# Patient Record
Sex: Male | Born: 1940
Health system: Southern US, Community
[De-identification: ages and names within clinical notes are randomized; demographics above are authoritative.]

## PROBLEM LIST (undated history)

## (undated) DIAGNOSIS — E785 Hyperlipidemia, unspecified: Secondary | ICD-10-CM

## (undated) DIAGNOSIS — I639 Cerebral infarction, unspecified: Secondary | ICD-10-CM

## (undated) DIAGNOSIS — M1990 Unspecified osteoarthritis, unspecified site: Secondary | ICD-10-CM

## (undated) DIAGNOSIS — H269 Unspecified cataract: Secondary | ICD-10-CM

## (undated) DIAGNOSIS — I10 Essential (primary) hypertension: Secondary | ICD-10-CM

## (undated) DIAGNOSIS — C801 Malignant (primary) neoplasm, unspecified: Secondary | ICD-10-CM

## (undated) HISTORY — DX: Malignant (primary) neoplasm, unspecified: C80.1

## (undated) HISTORY — DX: Essential (primary) hypertension: I10

## (undated) HISTORY — DX: Hyperlipidemia, unspecified: E78.5

## (undated) HISTORY — DX: Unspecified cataract: H26.9

## (undated) HISTORY — DX: Unspecified osteoarthritis, unspecified site: M19.90

## (undated) HISTORY — PX: COLONOSCOPY: SHX174

---

## 1966-11-13 HISTORY — PX: APPENDECTOMY: SHX54

## 2000-07-14 HISTORY — PX: CATARACT EXTRACTION: SUR2

## 2003-10-14 ENCOUNTER — Encounter: Payer: Self-pay | Admitting: Gastroenterology

## 2004-06-14 ENCOUNTER — Encounter: Admission: RE | Admit: 2004-06-14 | Discharge: 2004-06-14 | Payer: Self-pay | Admitting: Internal Medicine

## 2004-06-14 IMAGING — CR DG HIP COMPLETE 2+V*R*
3 series · 3 of 3 positions shown · non-contrast
Comparison: none

CLINICAL DATA: Right back and hip pain ? no known injury. 
 LUMBAR SPINE FOUR VIEWS: 
 There is no evidence of fracture.  Alignment is normal.  The intervertebral disc spaces are within normal limits and no other significant bone abnormalities are identified. 
 IMPRESSION
 Normal study. 
 RIGHT HIP COMPLETE: 
 AP view of the pelvis with cone down AP and frog leg views of the right hip were obtained.  No acute abnormality.  Joint space height of both hips is slightly diminished suggesting early degenerative change.  There are also some very minor degenerative changes at the SI joints.

[view not recorded (1 of 3)]
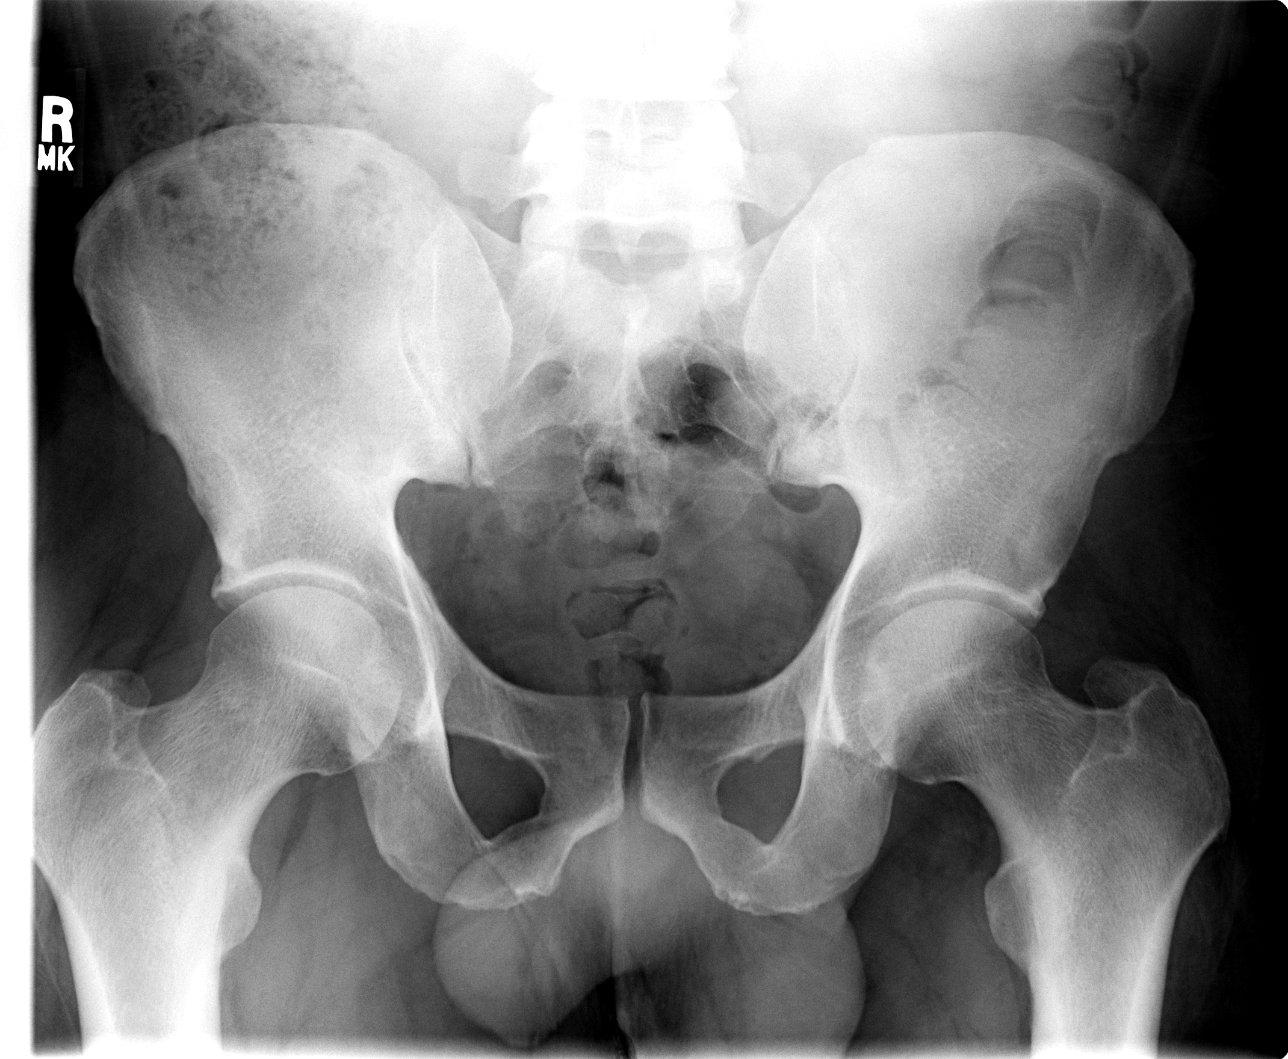

[view not recorded (2 of 3)]
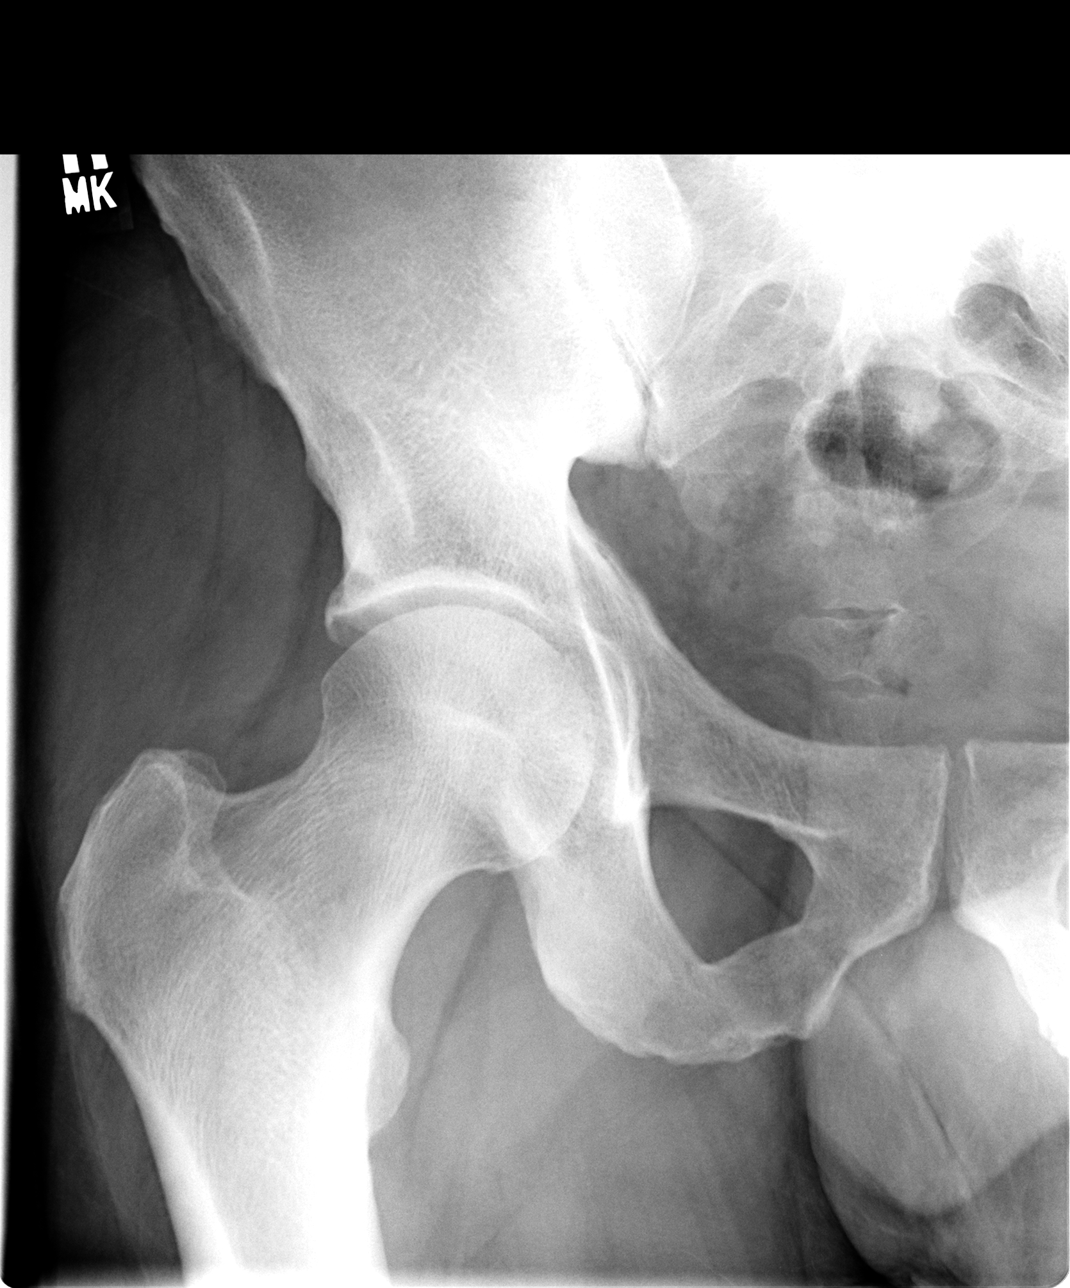

[view not recorded (3 of 3)]
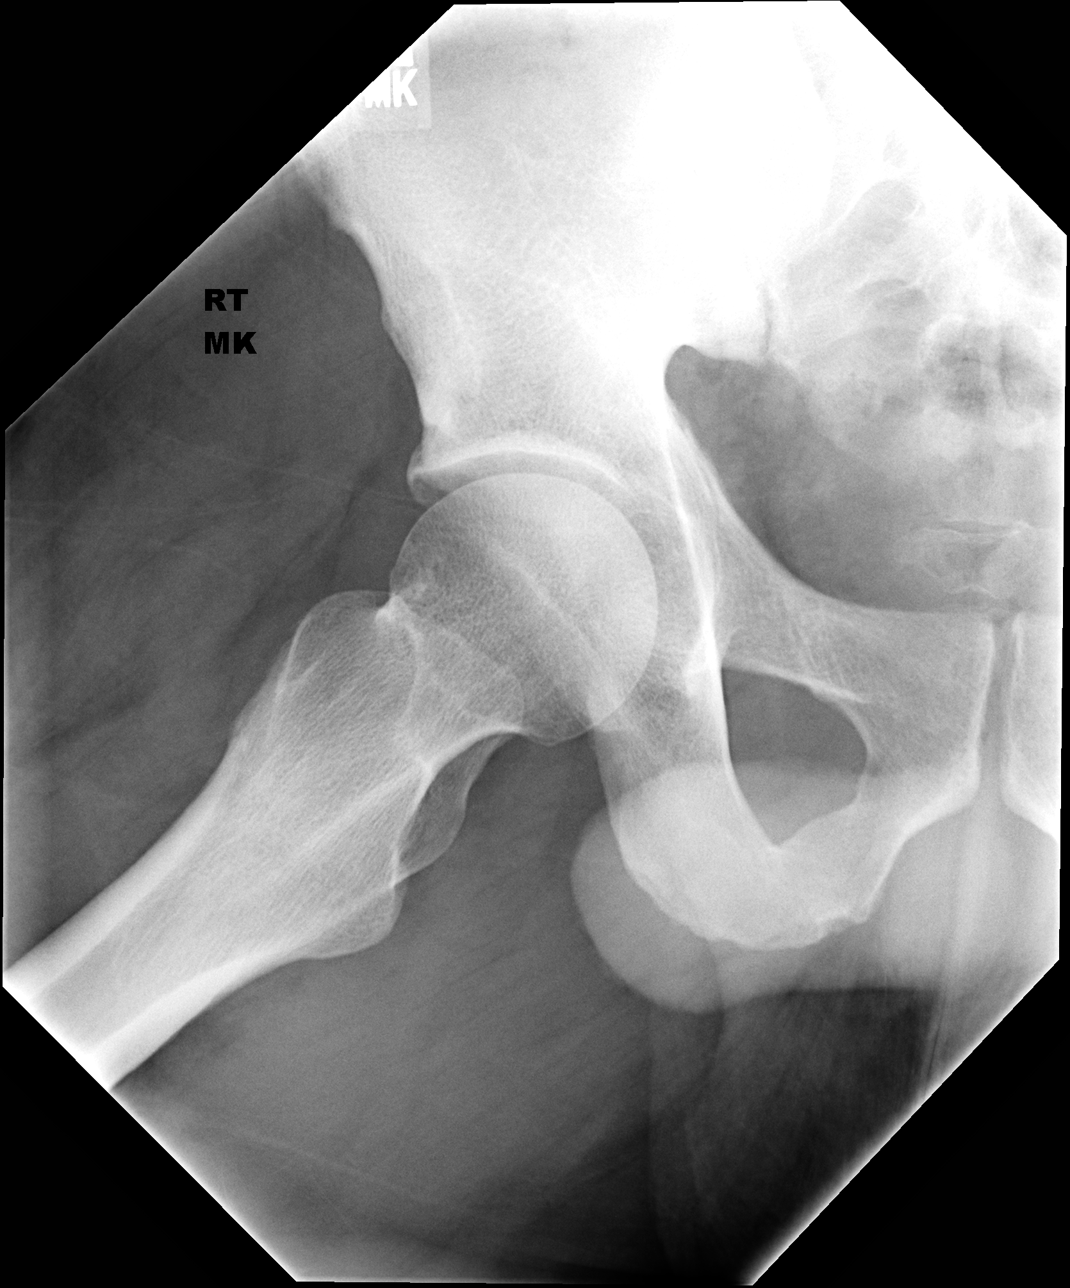

[3 of 3 positions shown; findings below may reference images not displayed]

IMPRESSION: Probable early degenerative changes of the hips.  No acute abnormality.

## 2004-06-14 IMAGING — CR DG LUMBAR SPINE COMPLETE 4+V
5 series · 5 of 5 positions shown · non-contrast
Comparison: none

CLINICAL DATA: Right back and hip pain ? no known injury. 
 LUMBAR SPINE FOUR VIEWS: 
 There is no evidence of fracture.  Alignment is normal.  The intervertebral disc spaces are within normal limits and no other significant bone abnormalities are identified. 
 IMPRESSION
 Normal study. 
 RIGHT HIP COMPLETE: 
 AP view of the pelvis with cone down AP and frog leg views of the right hip were obtained.  No acute abnormality.  Joint space height of both hips is slightly diminished suggesting early degenerative change.  There are also some very minor degenerative changes at the SI joints.

[view not recorded (1 of 5)]
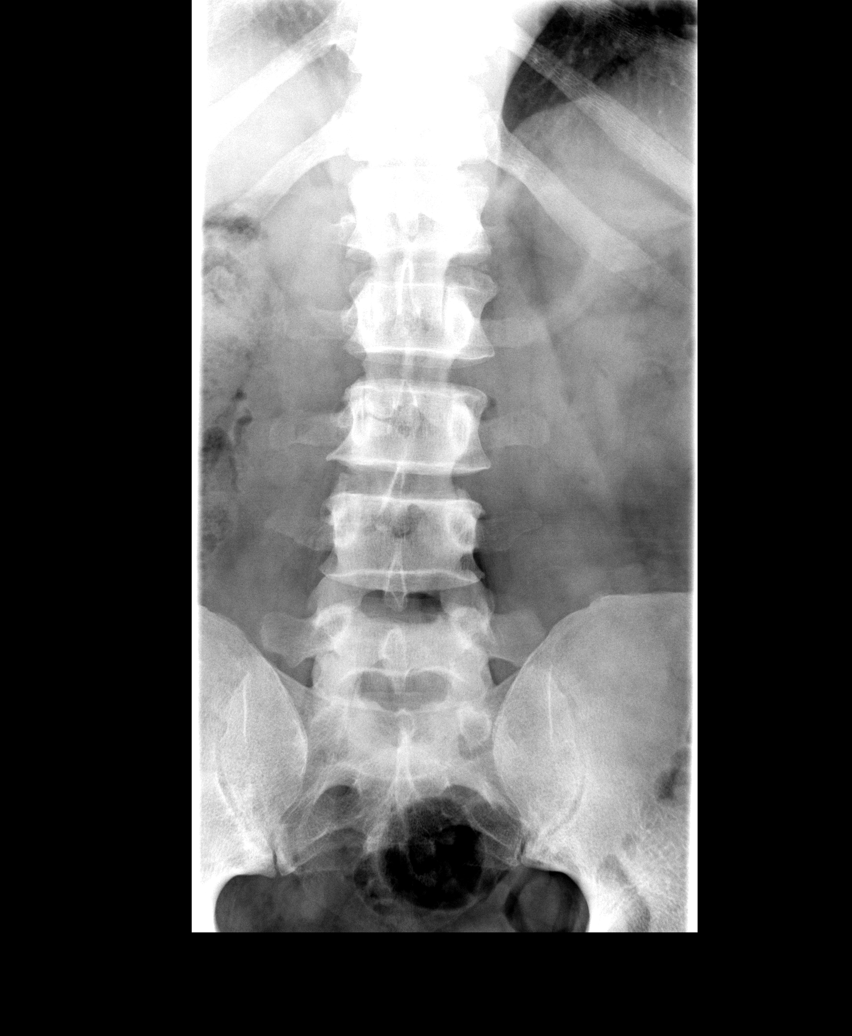

[view not recorded (2 of 5)]
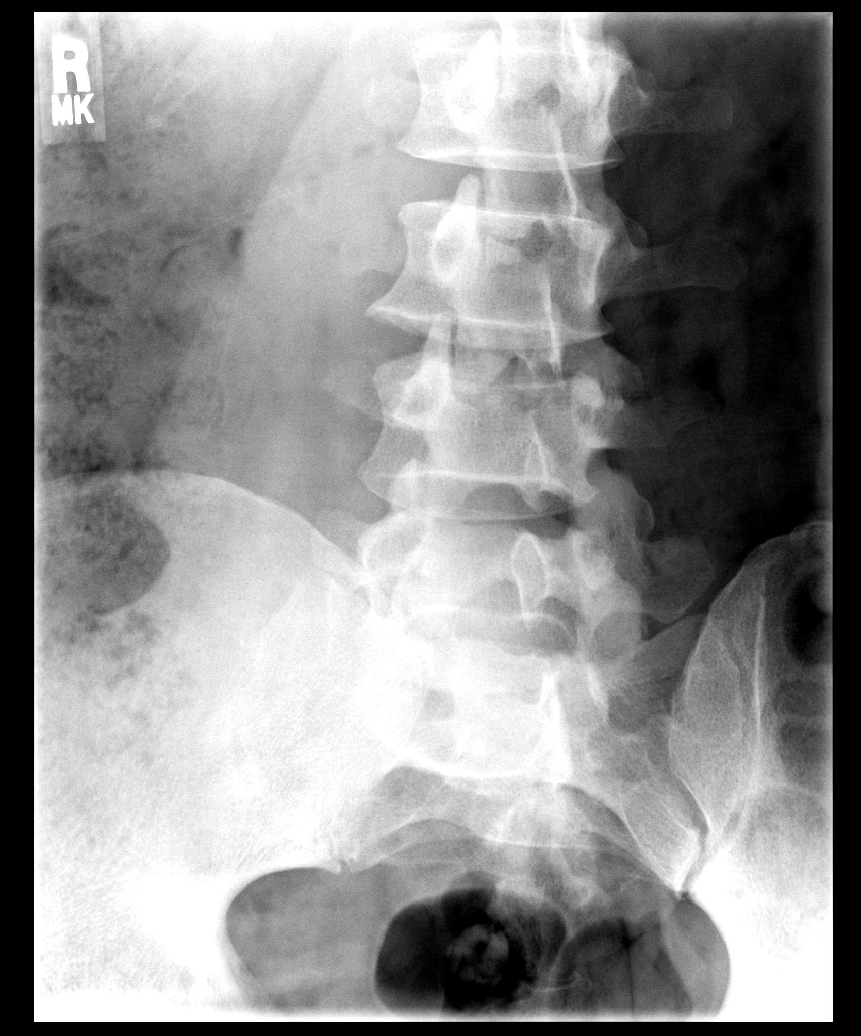

[view not recorded (3 of 5)]
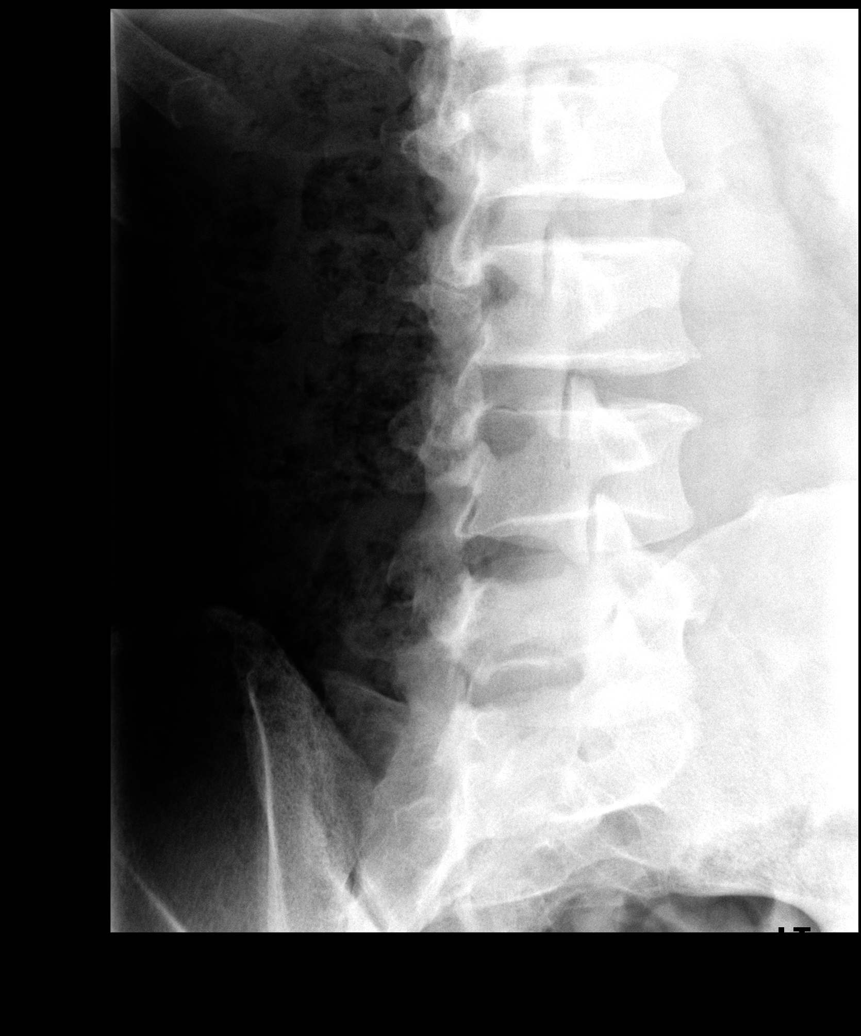

[view not recorded (4 of 5)]
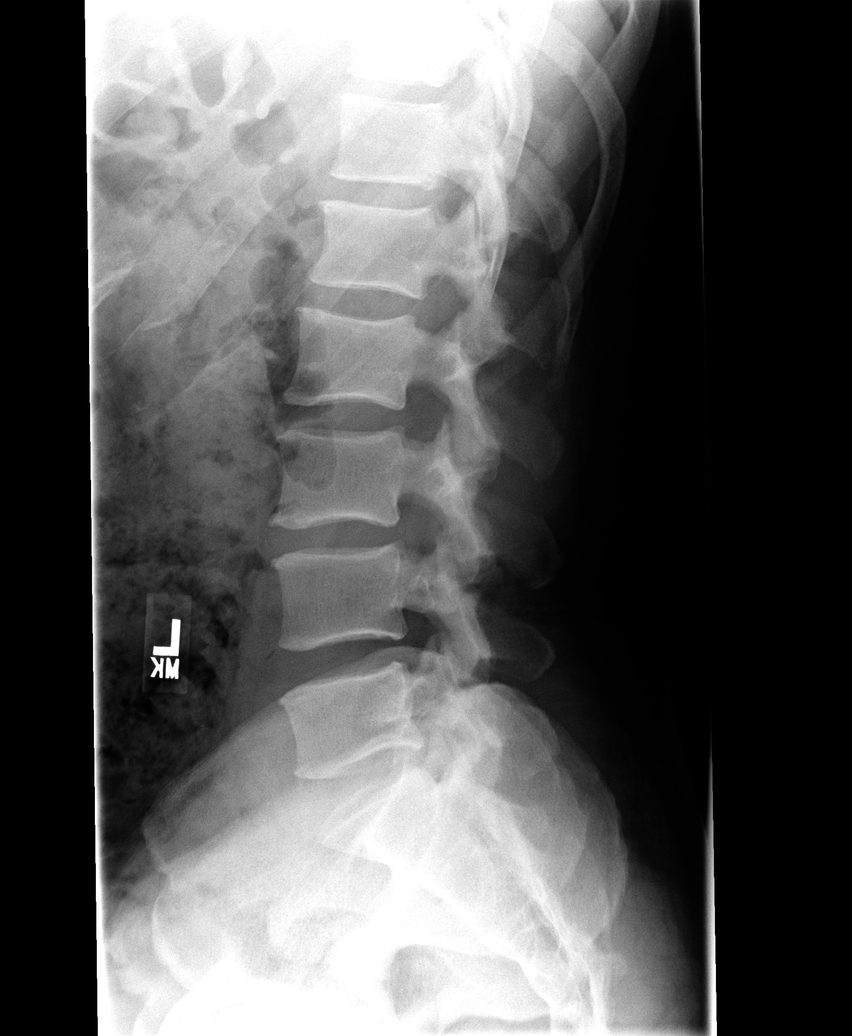

[view not recorded (5 of 5)]
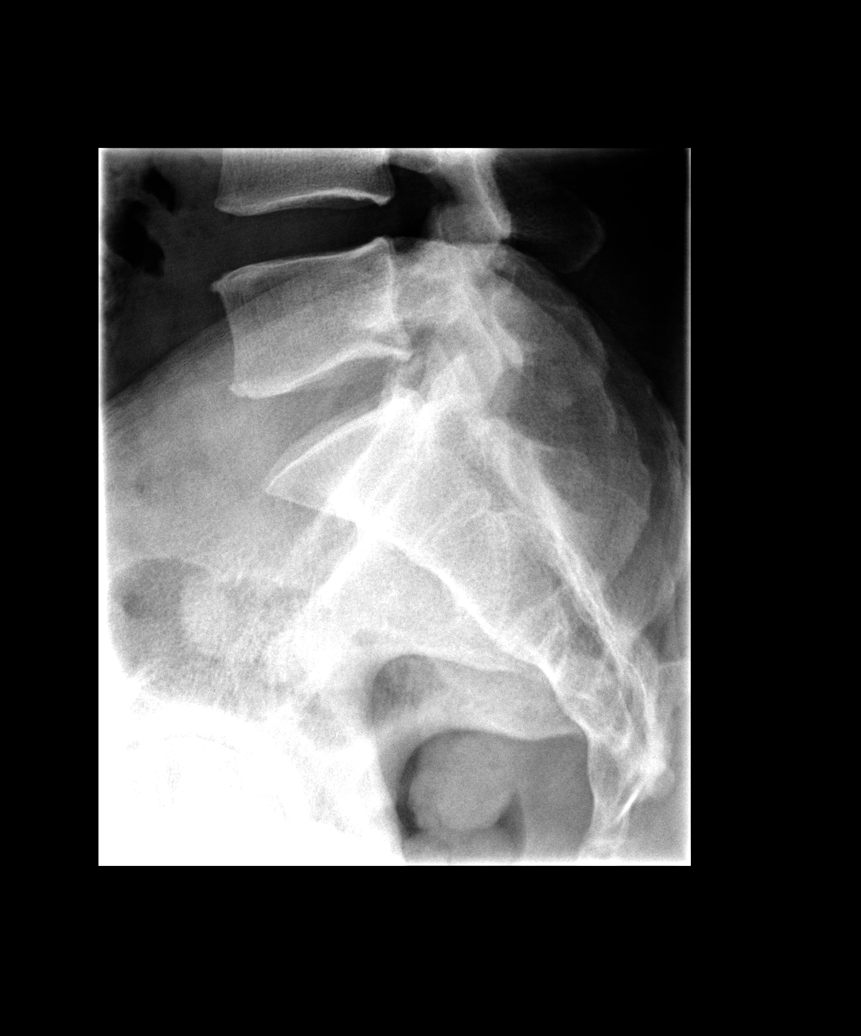

[5 of 5 positions shown; findings below may reference images not displayed]

IMPRESSION: Probable early degenerative changes of the hips.  No acute abnormality.

## 2004-10-28 ENCOUNTER — Ambulatory Visit: Payer: Self-pay | Admitting: Internal Medicine

## 2004-11-13 HISTORY — PX: PROSTATE BIOPSY: SHX241

## 2004-12-29 ENCOUNTER — Ambulatory Visit: Payer: Self-pay | Admitting: Internal Medicine

## 2005-10-30 ENCOUNTER — Ambulatory Visit: Payer: Self-pay | Admitting: Internal Medicine

## 2006-10-02 ENCOUNTER — Ambulatory Visit: Payer: Self-pay | Admitting: Internal Medicine

## 2006-10-02 LAB — CONVERTED CEMR LAB
AST: 31 units/L (ref 0–37)
BUN: 14 mg/dL (ref 6–23)
Creatinine, Ser: 1 mg/dL (ref 0.4–1.5)
Hgb A1c MFr Bld: 6.6 % — ABNORMAL HIGH (ref 4.6–6.0)
LDL DIRECT: 136.2 mg/dL
Microalb Creat Ratio: 5.1 mg/g (ref 0.0–30.0)

## 2006-10-16 ENCOUNTER — Ambulatory Visit: Payer: Self-pay | Admitting: Internal Medicine

## 2006-12-24 ENCOUNTER — Ambulatory Visit: Payer: Self-pay | Admitting: Internal Medicine

## 2006-12-24 LAB — CONVERTED CEMR LAB
Cholesterol: 161 mg/dL (ref 0–200)
Creatinine, Ser: 1 mg/dL (ref 0.4–1.5)
Creatinine,U: 59.3 mg/dL
Microalb, Ur: 0.2 mg/dL (ref 0.0–1.9)
Triglycerides: 59 mg/dL (ref 0–149)

## 2008-02-12 ENCOUNTER — Telehealth: Payer: Self-pay | Admitting: Internal Medicine

## 2008-06-15 ENCOUNTER — Ambulatory Visit: Payer: Self-pay | Admitting: Internal Medicine

## 2008-06-15 DIAGNOSIS — E785 Hyperlipidemia, unspecified: Secondary | ICD-10-CM

## 2008-06-15 DIAGNOSIS — E119 Type 2 diabetes mellitus without complications: Secondary | ICD-10-CM | POA: Insufficient documentation

## 2008-06-15 DIAGNOSIS — E1165 Type 2 diabetes mellitus with hyperglycemia: Secondary | ICD-10-CM | POA: Insufficient documentation

## 2008-06-15 DIAGNOSIS — N400 Enlarged prostate without lower urinary tract symptoms: Secondary | ICD-10-CM

## 2008-06-19 ENCOUNTER — Ambulatory Visit: Payer: Self-pay | Admitting: Internal Medicine

## 2008-06-19 ENCOUNTER — Encounter (INDEPENDENT_AMBULATORY_CARE_PROVIDER_SITE_OTHER): Payer: Self-pay | Admitting: *Deleted

## 2008-06-19 LAB — CONVERTED CEMR LAB: OCCULT 1: NEGATIVE

## 2008-06-20 ENCOUNTER — Encounter: Payer: Self-pay | Admitting: Internal Medicine

## 2008-06-22 ENCOUNTER — Encounter (INDEPENDENT_AMBULATORY_CARE_PROVIDER_SITE_OTHER): Payer: Self-pay | Admitting: *Deleted

## 2008-06-23 ENCOUNTER — Telehealth (INDEPENDENT_AMBULATORY_CARE_PROVIDER_SITE_OTHER): Payer: Self-pay | Admitting: *Deleted

## 2008-06-30 ENCOUNTER — Encounter: Payer: Self-pay | Admitting: Internal Medicine

## 2008-07-03 ENCOUNTER — Ambulatory Visit: Payer: Self-pay | Admitting: Gastroenterology

## 2008-07-06 ENCOUNTER — Ambulatory Visit: Payer: Self-pay | Admitting: Gastroenterology

## 2008-07-14 ENCOUNTER — Telehealth (INDEPENDENT_AMBULATORY_CARE_PROVIDER_SITE_OTHER): Payer: Self-pay | Admitting: *Deleted

## 2008-07-21 ENCOUNTER — Ambulatory Visit: Payer: Self-pay | Admitting: Gastroenterology

## 2008-07-21 LAB — CONVERTED CEMR LAB
OCCULT 2: NEGATIVE
OCCULT 5: NEGATIVE

## 2008-09-24 ENCOUNTER — Ambulatory Visit: Payer: Self-pay | Admitting: Internal Medicine

## 2008-10-11 LAB — CONVERTED CEMR LAB
ALT: 25 units/L (ref 0–53)
BUN: 13 mg/dL (ref 6–23)
Bilirubin, Direct: 0.1 mg/dL (ref 0.0–0.3)
HDL: 51.5 mg/dL (ref >39.0)
LDL Cholesterol: 91 mg/dL (ref 0–99)
Total Bilirubin: 1 mg/dL (ref 0.3–1.2)
Total CHOL/HDL Ratio: 2.9
Total Protein: 7 g/dL (ref 6.0–8.3)
VLDL: 9 mg/dL (ref 0–40)

## 2008-10-12 ENCOUNTER — Encounter (INDEPENDENT_AMBULATORY_CARE_PROVIDER_SITE_OTHER): Payer: Self-pay | Admitting: *Deleted

## 2008-10-27 ENCOUNTER — Ambulatory Visit: Payer: Self-pay | Admitting: Internal Medicine

## 2008-10-27 IMAGING — CR DG CHEST 2V
2 series · 2 of 2 positions shown · non-contrast
Comparison: None

CLINICAL DATA: History given of bronchitis, hemoptysis, coughing,
and prior tobacco smoking.

CHEST - 2 VIEW

[view not recorded (1 of 2)]
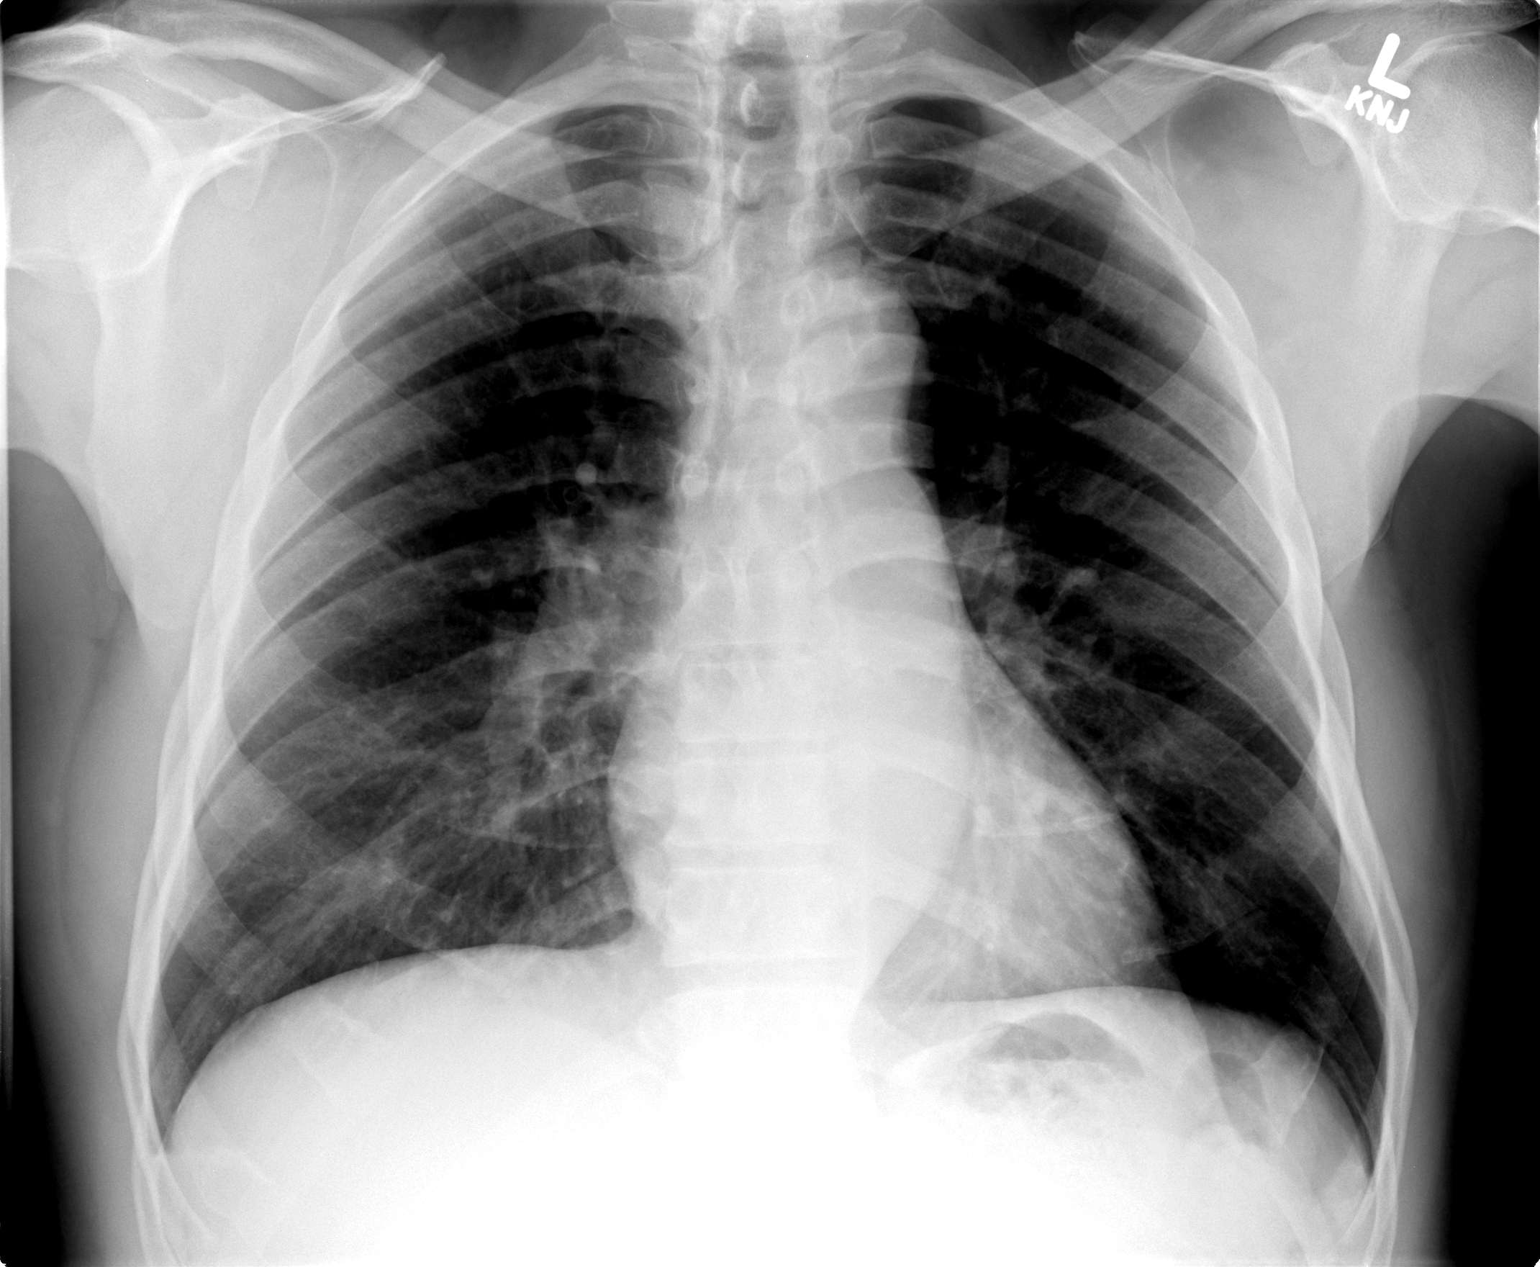

[view not recorded (2 of 2)]
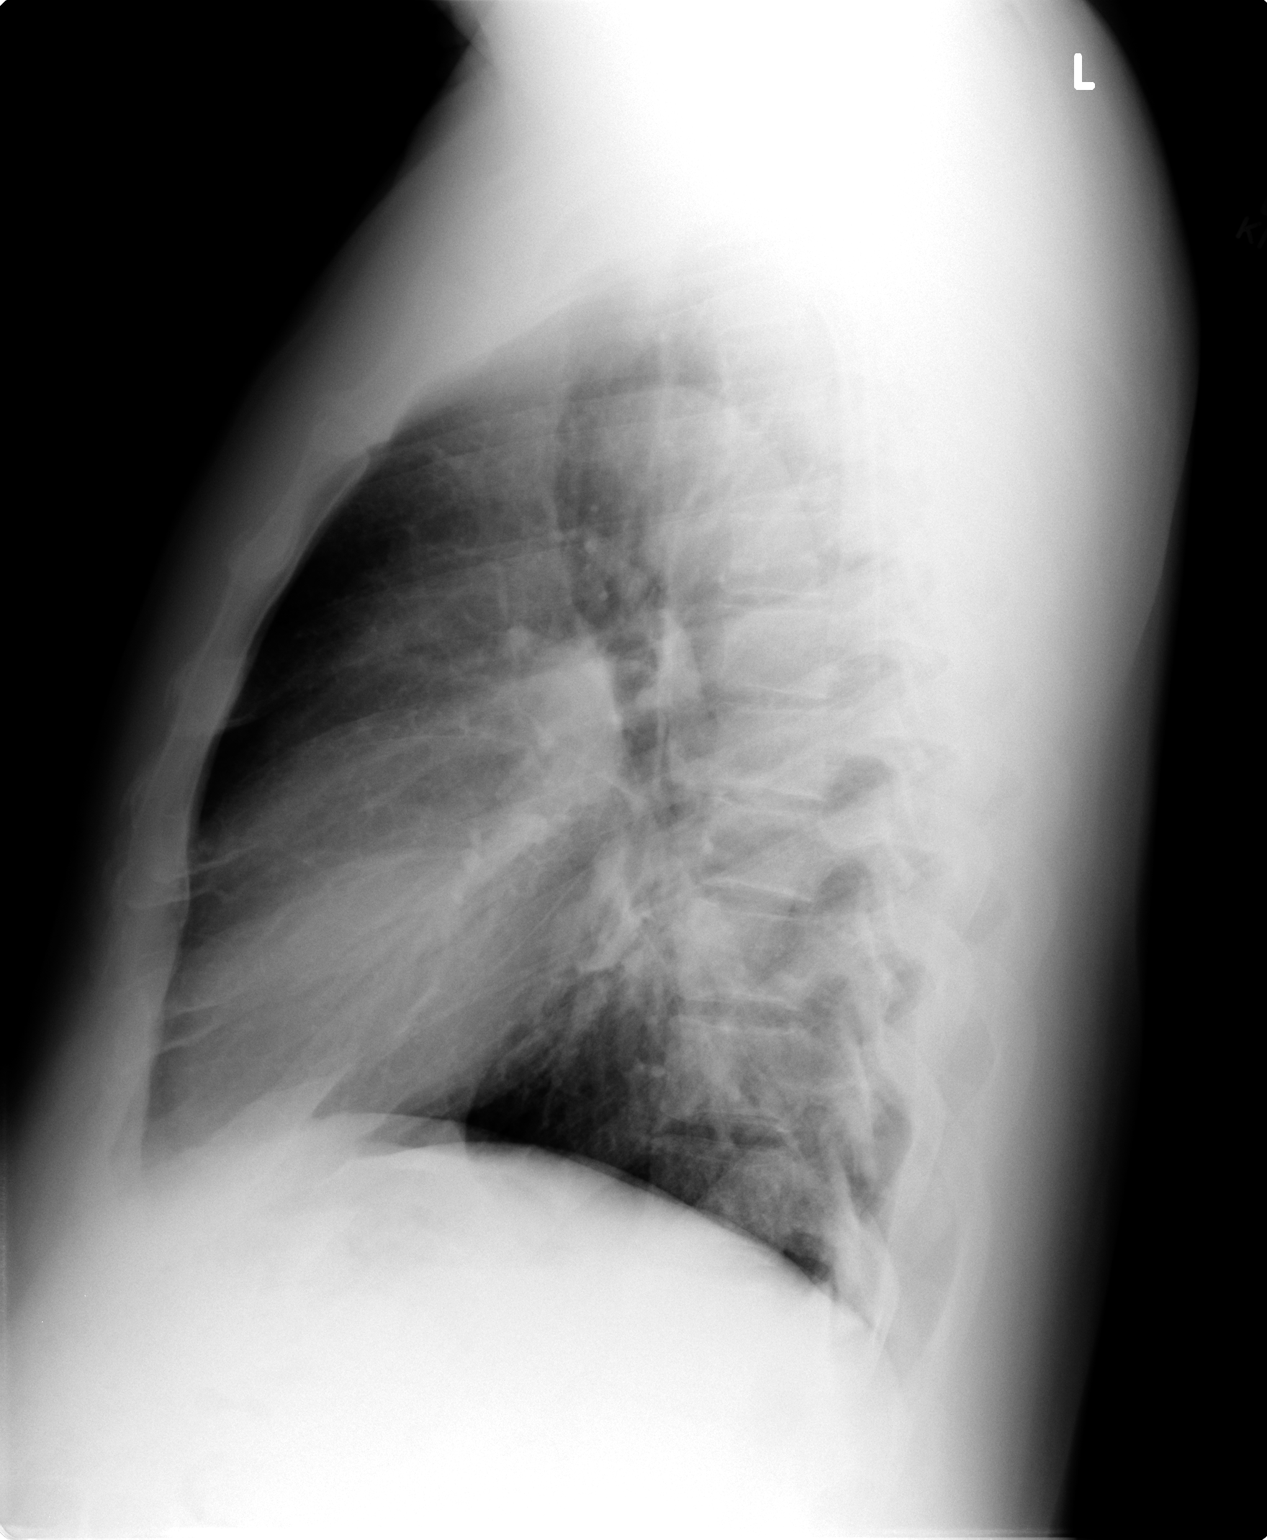

[2 of 2 positions shown; findings below may reference images not displayed]

FINDINGS: The cardiac silhouette is normal size and shape.  There
is ectasia and tortuosity of thoracic aorta.  There is a slight
hyperinflation configuration with slight flattening of diaphragm on
lateral view.  No pulmonary infiltrates are seen.  No pleural
effusion is evident.  Bones appear average for age.
IMPRESSION: There is a slight hyperinflation configuration with slight
flattening of diaphragm on lateral image.  No acute superimposed
process is identified.

## 2009-08-03 ENCOUNTER — Encounter: Payer: Self-pay | Admitting: Internal Medicine

## 2009-08-16 ENCOUNTER — Ambulatory Visit: Payer: Self-pay | Admitting: Internal Medicine

## 2009-08-18 ENCOUNTER — Encounter (INDEPENDENT_AMBULATORY_CARE_PROVIDER_SITE_OTHER): Payer: Self-pay | Admitting: *Deleted

## 2009-08-31 ENCOUNTER — Ambulatory Visit: Payer: Self-pay | Admitting: Internal Medicine

## 2009-08-31 ENCOUNTER — Encounter (INDEPENDENT_AMBULATORY_CARE_PROVIDER_SITE_OTHER): Payer: Self-pay | Admitting: *Deleted

## 2009-08-31 LAB — CONVERTED CEMR LAB: OCCULT 3: NEGATIVE

## 2009-11-03 ENCOUNTER — Ambulatory Visit: Payer: Self-pay | Admitting: Internal Medicine

## 2009-11-08 ENCOUNTER — Encounter (INDEPENDENT_AMBULATORY_CARE_PROVIDER_SITE_OTHER): Payer: Self-pay | Admitting: *Deleted

## 2009-11-08 LAB — CONVERTED CEMR LAB
Alkaline Phosphatase: 55 units/L (ref 39–117)
Bilirubin, Direct: 0.1 mg/dL (ref 0.0–0.3)
Cholesterol: 141 mg/dL (ref 0–200)
HDL: 55.1 mg/dL (ref >39.00)
LDL Cholesterol: 76 mg/dL (ref 0–99)
Total CHOL/HDL Ratio: 3
Triglycerides: 50 mg/dL (ref 0.0–149.0)
VLDL: 10 mg/dL (ref 0.0–40.0)

## 2010-01-11 ENCOUNTER — Ambulatory Visit: Payer: Self-pay | Admitting: Internal Medicine

## 2010-06-23 ENCOUNTER — Telehealth (INDEPENDENT_AMBULATORY_CARE_PROVIDER_SITE_OTHER): Payer: Self-pay | Admitting: *Deleted

## 2010-07-08 ENCOUNTER — Encounter: Payer: Self-pay | Admitting: Internal Medicine

## 2010-08-22 ENCOUNTER — Ambulatory Visit: Payer: Self-pay | Admitting: Internal Medicine

## 2010-08-22 ENCOUNTER — Encounter: Payer: Self-pay | Admitting: Internal Medicine

## 2010-08-23 LAB — CONVERTED CEMR LAB
ALT: 21 units/L (ref 0–53)
AST: 29 units/L (ref 0–37)
BUN: 11 mg/dL (ref 6–23)
Basophils Absolute: 0 10*3/uL (ref 0.0–0.1)
Basophils Relative: 0.8 % (ref 0.0–3.0)
CO2: 29 meq/L (ref 19–32)
Calcium: 9.3 mg/dL (ref 8.4–10.5)
Chloride: 106 meq/L (ref 96–112)
Direct LDL: 124.6 mg/dL
Eosinophils Absolute: 0.1 10*3/uL (ref 0.0–0.7)
Eosinophils Relative: 2.1 % (ref 0.0–5.0)
GFR calc non Af Amer: 105.53 mL/min (ref >60)
Glucose, Bld: 100 mg/dL — ABNORMAL HIGH (ref 70–99)
HDL: 61.9 mg/dL (ref >39.00)
Hgb A1c MFr Bld: 6.6 % — ABNORMAL HIGH (ref 4.6–6.5)
Lymphocytes Relative: 53.6 % — ABNORMAL HIGH (ref 12.0–46.0)
Lymphs Abs: 2.4 10*3/uL (ref 0.7–4.0)
Microalb Creat Ratio: 1 mg/g (ref 0.0–30.0)
Monocytes Absolute: 0.3 10*3/uL (ref 0.1–1.0)
Neutrophils Relative %: 38 % — ABNORMAL LOW (ref 43.0–77.0)
Platelets: 144 10*3/uL — ABNORMAL LOW (ref 150.0–400.0)
Potassium: 4.6 meq/L (ref 3.5–5.1)
RBC: 5.11 M/uL (ref 4.22–5.81)
TSH: 1.31 microintl units/mL (ref 0.35–5.50)
Total Bilirubin: 1.1 mg/dL (ref 0.3–1.2)
Total CHOL/HDL Ratio: 3
Total Protein: 6.8 g/dL (ref 6.0–8.3)
WBC: 4.6 10*3/uL (ref 4.5–10.5)

## 2010-12-11 LAB — CONVERTED CEMR LAB
ALT: 22 units/L (ref 0–53)
AST: 26 units/L (ref 0–37)
Albumin: 4.2 g/dL (ref 3.5–5.2)
Alkaline Phosphatase: 66 units/L (ref 39–117)
BUN: 11 mg/dL (ref 6–23)
BUN: 13 mg/dL (ref 6–23)
Basophils Absolute: 0 10*3/uL (ref 0.0–0.1)
Bilirubin, Direct: 0.1 mg/dL (ref 0.0–0.3)
CO2: 31 meq/L (ref 19–32)
Calcium: 9.5 mg/dL (ref 8.4–10.5)
Chloride: 103 meq/L (ref 96–112)
Chloride: 108 meq/L (ref 96–112)
Cholesterol: 188 mg/dL (ref 0–200)
Creatinine, Ser: 1 mg/dL (ref 0.4–1.5)
Creatinine, Ser: 1 mg/dL (ref 0.4–1.5)
Eosinophils Relative: 1.3 % (ref 0.0–5.0)
Eosinophils Relative: 1.7 % (ref 0.0–5.0)
GFR calc non Af Amer: 79 mL/min
GFR calc non Af Amer: 94.93 mL/min (ref >60)
Glucose, Bld: 116 mg/dL — ABNORMAL HIGH (ref 70–99)
HCT: 47.8 % (ref 39.0–52.0)
HDL goal, serum: 40 mg/dL
HDL: 49.4 mg/dL (ref >39.0)
Hemoglobin: 15.8 g/dL (ref 13.0–17.0)
Hemoglobin: 15.9 g/dL (ref 13.0–17.0)
Hgb A1c MFr Bld: 6.3 % (ref 4.6–6.5)
Ketones, urine, test strip: NEGATIVE
LDL Cholesterol: 128 mg/dL — ABNORMAL HIGH (ref 0–99)
LDL Goal: 100 mg/dL
Lymphocytes Relative: 49.6 % — ABNORMAL HIGH (ref 12.0–46.0)
Lymphocytes Relative: 52.2 % — ABNORMAL HIGH (ref 12.0–46.0)
MCHC: 33.8 g/dL (ref 30.0–36.0)
MCV: 88.1 fL (ref 78.0–100.0)
Microalb Creat Ratio: 1.8 mg/g (ref 0.0–30.0)
Microalb, Ur: 0.2 mg/dL (ref 0.0–1.9)
Monocytes Relative: 6 % (ref 3.0–12.0)
Neutro Abs: 1.7 10*3/uL (ref 1.4–7.7)
RBC: 5.43 M/uL (ref 4.22–5.81)
RDW: 12.5 % (ref 11.5–14.6)
RDW: 12.7 % (ref 11.5–14.6)
Specific Gravity, Urine: <1.005
TSH: 1.44 microintl units/mL (ref 0.35–5.50)
Total Bilirubin: 1.3 mg/dL — ABNORMAL HIGH (ref 0.3–1.2)
Total Bilirubin: 1.3 mg/dL — ABNORMAL HIGH (ref 0.3–1.2)
Total CHOL/HDL Ratio: 3.8
Total CHOL/HDL Ratio: 4
Urobilinogen, UA: 0.2
VLDL: 10 mg/dL (ref 0–40)
WBC Urine, dipstick: NEGATIVE
WBC: 4.1 10*3/uL — ABNORMAL LOW (ref 4.5–10.5)

## 2010-12-14 NOTE — Progress Notes (Signed)
Summary: refill  Phone Note Refill Request Message from:  Fax from Pharmacy on June 23, 2010 8:15 AM  Refills Requested: Medication #1:  GLUCOPHAGE 500 MG  TABS 1 two times a day with 2 largest meals [facility] - fax [phone]  Initial call taken by: Okey Regal Spring,  June 23, 2010 8:18 AM  Follow-up for Phone Call        Copied from 01/2010: Recheck A1c in 6 months. [facility]opp   RX Faxed to: [phone]  Follow-up by: Shonna Chock CMA,  June 23, 2010 8:23 AM    New/Updated Medications: GLUCOPHAGE 500 MG  TABS (METFORMIN HCL) 1 two times a day with 2 largest meals **DUE for labs 07/2010** Prescriptions: GLUCOPHAGE 500 MG  TABS (METFORMIN HCL) 1 two times a day with 2 largest meals **DUE for labs 07/2010**  #180 x 0   Entered by:   Shonna Chock CMA   Authorized by:   Marga Melnick MD   Signed by:   Shonna Chock CMA on 06/23/2010   Method used:   Print then Give to Patient   RxID:   [id number]

## 2010-12-14 NOTE — Assessment & Plan Note (Signed)
Summary: CPX/FASTING//KN   Vital Signs:  Patient profile:   69 year old male Height:      71.5 inches Weight:      209.8 pounds BMI:     28.96 Temp:     98.3 degrees F oral Pulse rate:   72 / minute Resp:     14 per minute BP sitting:   126 / 80  (left arm) Cuff size:   large  Vitals Entered By: Shonna Chock CMA (August 22, 2010 11:13 AM) CC: CPX with fasting labs , Type 2 diabetes mellitus follow-up   Primary Care Provider:  Oren Bracket  CC:  CPX with fasting labs  and Type 2 diabetes mellitus follow-up.  History of Present Illness: Here for Medicare AWV: 1.Risk factors based on Past M, S, F history: DM; Dyslipidemia; BPH(chart updated) 2.Physical Activities: CVE 3-4X/week as  treadmill 30-45 min, walking ,weights w/o symptoms 3.Depression/mood: no issues 4.Hearing: decreased to whisper on L @ 6 ft; he is not wearing his aids 5.ADL's: no limitations 6.Fall Risk: no imbalance or co-ordination issues 7.Home Safety: home safety proofed 8.Height, weight, &visual acuity:wall chart read @ 6 ft with lenses 9.Counseling: none requested ; POA & Living Will in place 10.Labs ordered based on risk factors: see Orders 11.Referral Coordination: Audiology recheck annually recommended 12.  Care Plan: see Instructions 13.Cognitive Assessment: memory  & recall intact; "WORLD " spelled backwards; mood & affect normal. Type 2 Diabetes Mellitus Follow-Up      This is a 70 year old man who presents for Type 2 diabetes mellitus follow-up.  The patient denies polyuria, polydipsia, blurred vision, self managed hypoglycemia, weight loss, weight gain, and numbness of extremities.  The patient denies the following symptoms: neuropathic pain, chest pain, vomiting, orthostatic symptoms, poor wound healing, intermittent claudication, vision loss, and foot ulcer.  Since the last visit the patient reports good dietary compliance.  The patient has been measuring capillary blood glucose before breakfast  ( 95-115) & 2 hrs  after breakfast and after dinner ( high < 130).  Since the last visit, the patient reports having had eye care by an Ophthalmologist and no foot care.    Preventive Screening-Counseling & Management  Alcohol-Tobacco     Alcohol drinks/day: 0     Smoking Status: quit > 6 months     Packs/Day: 1 ppd     Year Quit: 1968  Caffeine-Diet-Exercise     Caffeine use/day: decaf only     Diet Comments: hypoglyglycemic carbs  Hep-HIV-STD-Contraception     Dental Visit-last 6 months yes     Sun Exposure-Excessive: no  Safety-Violence-Falls     Seat Belt Use: yes     Firearms in the Home: firearms in the home     Firearm Counseling: not indicated; uses recommended firearm safety measures     Smoke Detectors: yes      Blood Transfusions:  no.        Travel History:  [place]  2006.    Current Medications (verified): 1)  Freestyle Lancets   Misc (Lancets) .... Check Sugars Once Daily 2)  Freestyle Test   Strp (Glucose Blood) .... Use As Directed 3)  Aleve 220 Mg  Caps (Naproxen Sodium) .Marland Kitchen.. 1 By Mouth As Needed 4)  Glucophage 500 Mg  Tabs (Metformin Hcl) .Marland Kitchen.. 1 Two Times A Day With 2 Largest Meals **due For Labs 07/2010** 5)  Rapaflo 4 Mg Caps (Silodosin) .Marland Kitchen.. 1 By Mouth Once Daily  Allergies (verified): No Known Drug Allergies  Past History:  Past Medical History: Diabetes mellitus, type II Hyperlipidemia: LDL goal = < 100 based on NMR Lipoprofile Benign prostatic hypertrophy with elevated PSA, Dr Brunilda Payor, Urologist   Past Surgical History: 1968-Appendectomy 2004 & 2009 Colonoscopy-negative, Dr Jarold Motto  . 2006-prostate biopsy, Dr Lenor Derrick  Family History: Father: CVA in 51s Mother: HTN, CAD M aunt: DM; bro :pancreatic cancer; PGM :CVA;P aunt :CAD; P aunt :breast cancer ;MGM :CAD; 38M uncles : CAD; No FH of Colon Cancer:  Social History: Modified low carbohydrate  Patient is a former smoker; quit 1968.  Alcohol Use - yes occa  wine  or beer Retired  [employer] [employer] Married Regular exercise-yes: machines , treadmill 45 min > 3X/week Smoking Status:  quit > 6 months Packs/Day:  1 ppd Caffeine use/day:  decaf only Dental Care w/in 6 mos.:  yes Sun Exposure-Excessive:  no Seat Belt Use:  yes Blood Transfusions:  no  Review of Systems  The patient denies anorexia, fever, hoarseness, dyspnea on exertion, peripheral edema, prolonged cough, headaches, hemoptysis, abdominal pain, melena, hematochezia, severe indigestion/heartburn, suspicious skin lesions, unusual weight change, abnormal bleeding, enlarged lymph nodes, and angioedema.         Dr Brunilda Payor seen every 6 months.  Physical Exam  General:  well-nourished ; appears younger than age;alert,appropriate and cooperative throughout examination Head:  Normocephalic and atraumatic without obvious abnormalities. No apparent alopecia  Eyes:  No corneal or conjunctival inflammation noted. EOMI. Perrla. Funduscopic exam benign, without hemorrhages, exudates or papilledema. Vision grossly normal.Arcus senilis Ears:  External ear exam shows no significant lesions or deformities.  Otoscopic examination reveals clear canals, tympanic membranes are intact bilaterally without bulging, retraction, inflammation or discharge. Hearing is grossly normal bilaterally. Nose:  External nasal examination shows no deformity or inflammation. Nasal mucosa are pink and moist without lesions or exudates. Mouth:  Oral mucosa and oropharynx without lesions or exudates.  Teeth in good repair. Upper partial . Tongue scalloped Neck:  No deformities, masses, or tenderness noted. Lungs:  Normal respiratory effort, chest expands symmetrically. Lungs are clear to auscultation, no crackles or wheezes. Heart:  Normal rate and regular rhythm. S1 and S2 normal without gallop, murmur, click, rub . S4 Abdomen:  Bowel sounds positive,abdomen soft and non-tender without masses, organomegaly or hernias noted. Genitalia:  Dr  Brunilda Payor Prostate:  Dr Brunilda Payor Msk:  No deformity or scoliosis noted of thoracic or lumbar spine.   Pulses:  R and L carotid,radial,dorsalis pedis and posterior tibial pulses are full and equal bilaterally Extremities:  No clubbing, cyanosis, edema, or deformity noted with normal full range of motion of all joints.Good nail health  Neurologic:  alert & oriented X3 and DTRs symmetrical and normal.   Skin:  Intact without suspicious lesions or rashes. Keratoses posterior trunk Cervical Nodes:  No lymphadenopathy noted Axillary Nodes:  No palpable lymphadenopathy Psych:  memory intact for recent and remote, normally interactive, good eye contact, not anxious appearing, and not depressed appearing.     Impression & Recommendations:  Problem # 1:  PREVENTIVE HEALTH CARE (ICD-V70.0)  Orders: TLB-CBC Platelet - w/Differential (85025-CBCD)  Problem # 2:  DIABETES MELLITUS, TYPE II (ICD-250.00)  His updated medication list for this problem includes:    Glucophage 500 Mg Tabs (Metformin hcl) .Marland Kitchen... 1 two times a day with 2 largest meals  Orders: EKG w/ Interpretation (93000) Venipuncture (08657) TLB-BMP (Basic Metabolic Panel-BMET) (80048-METABOL) TLB-Microalbumin/Creat Ratio, Urine (82043-MALB) TLB-A1C / Hgb A1C (Glycohemoglobin) (83036-A1C)  Problem # 3:  HYPERLIPIDEMIA (  ICD-272.4)  Orders: Venipuncture (16109) TLB-Lipid Panel (80061-LIPID) TLB-Hepatic/Liver Function Pnl (80076-HEPATIC) TLB-TSH (Thyroid Stimulating Hormone) (84443-TSH)  Problem # 4:  BENIGN PROSTATIC HYPERTROPHY (ICD-600.00) as per Dr Brunilda Payor His updated medication list for this problem includes:    Rapaflo 4 Mg Caps (Silodosin) .Marland Kitchen... 1 by mouth once daily  Complete Medication List: 1)  Freestyle Lancets Misc (Lancets) .... Check sugars once daily 2)  Freestyle Test Strp (Glucose blood) .... Use as directed 3)  Aleve 220 Mg Caps (Naproxen sodium) .Marland Kitchen.. 1 by mouth as needed 4)  Glucophage 500 Mg Tabs (Metformin hcl) .Marland Kitchen.. 1  two times a day with 2 largest meals 5)  Rapaflo 4 Mg Caps (Silodosin) .Marland Kitchen.. 1 by mouth once daily  Other Orders: [facility] -Subsequent Annual Wellness Visit [phone])  Patient Instructions: 1)  It is important that you exercise regularly at least 20 minutes 5 times a week. If you develop chest pain, have severe difficulty breathing, or feel very tired , stop exercising immediately and seek medical attention. 2)  Schedule a colonoscopy as per Dr. Jarold Motto  to help detect colon cancer. Take an 81 mg coated Aspirin every day. 3)  Check your blood sugars regularly. If your readings are usually above : 150 or below 90 you should contact our office. 4)  See your eye doctor yearly to check for diabetic eye damage. 5)  Check your feet each night for sore areas, calluses or signs of infection. Prescriptions: GLUCOPHAGE 500 MG  TABS (METFORMIN HCL) 1 two times a day with 2 largest meals  #180 x 3   Entered and Authorized by:   Marga Melnick MD   Signed by:   Marga Melnick MD on 08/22/2010   Method used:   Print then Give to Patient   RxID:   [id number]

## 2010-12-14 NOTE — Miscellaneous (Signed)
Summary: Flu Vaccination/[facility]  Flu Vaccination/[facility]   Imported By: Sherian Rein 07/21/2010 13:22:31  _____________________________________________________________________  External Attachment:    Type:   Image     Comment:   External Document

## 2010-12-14 NOTE — Assessment & Plan Note (Signed)
Summary: LEFT FOOT PAIN/RH.....   Vital Signs:  Patient profile:   70 year old male Weight:      212.2 pounds Temp:     98.1 degrees F oral Pulse rate:   76 / minute Resp:     14 per minute BP sitting:   130 / 82  (left arm) Cuff size:   large  Vitals Entered By: Shonna Chock (January 11, 2010 12:03 PM) CC: Left foot/ankle pain x 1 year (off/on) Comments REVIEWED MED LIST, PATIENT AGREED DOSE AND INSTRUCTION CORRECT    Primary Care Provider:  Chrissie Noa Kinzley Savell,MD  CC:  Left foot/ankle pain x 1 year (off/on).  History of Present Illness: Intermittent "deep" pain since severe strain 12 mos ago.Rx: soft brace when golfing;topical sports cream. Aleve helps.PMH of bursitis L or R  hip; Aleve helps. No FH or PMH of  arthritis .  Allergies (verified): No Known Drug Allergies  Review of Systems MS:  Complains of joint pain; denies joint redness, joint swelling, low back pain, mid back pain, and thoracic pain. Endo:  Denies excessive hunger, excessive thirst, and excessive urination; FBS  & 2 hr post meal glucoses < 130.  Physical Exam  General:  well-nourished,in no acute distress; alert,appropriate and cooperative throughout examination Pulses:  R and L dorsalis pedis and posterior tibial pulses are full and equal bilaterally Extremities:  No clubbing, cyanosis, edema, or deformity noted with normal full range of motion of all joints.  No edema Skin:  Intact without suspicious lesions or rashes   Impression & Recommendations:  Problem # 1:  ANKLE PAIN, LEFT (ICD-719.47)  PMH of strain  Orders: Venipuncture (16109) TLB-Uric Acid, Blood (84550-URIC)  Problem # 2:  DIABETES MELLITUS, TYPE II (ICD-250.00)  His updated medication list for this problem includes:    Glucophage 500 Mg Tabs (Metformin hcl) .Marland Kitchen... 1 two times a day with 2 largest meals  Orders: Venipuncture (60454) TLB-A1C / Hgb A1C (Glycohemoglobin) (83036-A1C)  Complete Medication List: 1)  Freestyle Lancets  Misc (Lancets) .... Check sugars once daily 2)  Freestyle Test Strp (Glucose blood) .... Use as directed 3)  Aleve 220 Mg Caps (Naproxen sodium) .Marland Kitchen.. 1 by mouth as needed 4)  Glucophage 500 Mg Tabs (Metformin hcl) .Marland Kitchen.. 1 two times a day with 2 largest meals 5)  Rapaflo 4 Mg Caps (Silodosin) .Marland Kitchen.. 1 by mouth once daily  Patient Instructions: 1)  Check your blood sugars regularly. If your readings are usually above : 150 or below 90 you should contact our office.Aleve 1-2 every 8-12 hrs as needed

## 2010-12-19 ENCOUNTER — Telehealth (INDEPENDENT_AMBULATORY_CARE_PROVIDER_SITE_OTHER): Payer: Self-pay | Admitting: *Deleted

## 2010-12-29 NOTE — Progress Notes (Signed)
Summary: refill  Phone Note Refill Request Message from:  Patient on December 19, 2010 3:12 PM  Refills Requested: Medication #1:  GLUCOPHAGE 500 MG  TABS 1 two times a day with 2 largest meals [facility] - fax [phone]  Initial call taken by: Okey Regal Spring,  December 19, 2010 3:15 PM  Follow-up for Phone Call        Faxed rx to: [phone]  Follow-up by: Shonna Chock CMA,  December 20, 2010 9:08 AM    New/Updated Medications: GLUCOPHAGE 500 MG  TABS (METFORMIN HCL) 1 two times a day with 2 largest meals **LABS DUE 02/2011** Prescriptions: GLUCOPHAGE 500 MG  TABS (METFORMIN HCL) 1 two times a day with 2 largest meals **LABS DUE 02/2011**  #180 x 0   Entered by:   Shonna Chock CMA   Authorized by:   Marga Melnick MD   Signed by:   Shonna Chock CMA on 12/20/2010   Method used:   Print then Give to Patient   RxID:   [id number]

## 2011-02-09 ENCOUNTER — Encounter: Payer: Self-pay | Admitting: Internal Medicine

## 2011-02-09 ENCOUNTER — Ambulatory Visit (INDEPENDENT_AMBULATORY_CARE_PROVIDER_SITE_OTHER): Payer: Medicare Other | Admitting: Internal Medicine

## 2011-02-09 DIAGNOSIS — E785 Hyperlipidemia, unspecified: Secondary | ICD-10-CM

## 2011-02-09 DIAGNOSIS — E119 Type 2 diabetes mellitus without complications: Secondary | ICD-10-CM

## 2011-02-09 DIAGNOSIS — R21 Rash and other nonspecific skin eruption: Secondary | ICD-10-CM

## 2011-02-09 DIAGNOSIS — M722 Plantar fascial fibromatosis: Secondary | ICD-10-CM

## 2011-02-09 MED ORDER — METFORMIN HCL 500 MG PO TABS: 500.0000 mg | ORAL_TABLET | Freq: Two times a day (BID) | ORAL | Status: DC

## 2011-02-09 MED ORDER — SIMVASTATIN 20 MG PO TABS: 20.0000 mg | ORAL_TABLET | Freq: Every evening | ORAL | Status: DC

## 2011-02-09 MED ORDER — BUTENAFINE HCL 1 % EX CREA: 24.0000 g | TOPICAL_CREAM | Freq: Two times a day (BID) | CUTANEOUS | Status: AC

## 2011-02-09 NOTE — Patient Instructions (Signed)
Do the arch exercises twice a day for 20 repetitions. Put arch supports in all shoes. The pending labs will tell us if we need to make any additional changes in  medicines such as reducing the metformin.

## 2011-02-09 NOTE — Progress Notes (Signed)
Addended byMarga Melnick on: 02/09/2011 04:56 PM   Modules accepted: Orders, Medications

## 2011-02-09 NOTE — Progress Notes (Signed)
Subjective:    Patient ID: Robert Warner, male    DOB: 01-02-1941, 70 y.o.   MRN: [id number]  HPI Diabetes Monitor : Serial labs were reviewed back to 2010. His A1c was 6.6 heart rate 5 months ago. His LDL was 124.6. These values are on pravastatin 20 mg daily and metformin 5 mg twice a day.   The A1c test checks the average amount of glucose (sugar) in the blood over the last 2 to 3 months. It does this by measuring the concentration of glycosylated hemoglobin. As glucose circulates in the blood, some of it binds to hemoglobin A. This is the main form of hemoglobin in adults. Hemoglobin is a red protein that carries oxygen in the red blood cells (RBC's). Once the glucose is bound to the hemoglobin A, it remains there for the life of the red blood cell (about 120 days). This combination of glucose and hemoglobin A is called A1c (or hemoglobin A1c or glycohemoglobin). Increased glucose in the blood, increases the hemoglobin A1c. A1c levels do not change quickly but will shift as older RBC's die and younger ones take their place.   The A1c test is used primarily to monitor the glucose control of diabetics over time. The goal of those with diabetes is to keep their blood glucose levels as close to normal as possible. This helps to minimize the complications caused by chronically elevated glucose levels, such as progressive damage to body organs like the kidneys, eyes, cardiovascular system, and nerves. The A1c test gives a picture of the average amount of glucose in the blood over the last few months. It can help a patient and his doctor know if the measures they are taking to control the patient's diabetes are successful or need to be adjusted.     Depending on the type of diabetes that you have, how well your diabetes is controlled, your A1c may be measured 2 to 4 times each year. When someone is first diagnosed with diabetes or if control is not good, A1c may be ordered more frequently.   PREPARATION  FOR TEST:   No preparation or fasting is necessary. A blood sample is taken by a needle from a vein or a drop of blood can be taken from your finger.   NORMAL VALUES  Non diabetic adults: 2.2%-6.1%  Good diabetic control: 6.2-6.4 %  Fair diabetic control: 6.5-7%  Poor diabetic control: greater than 7 %( except with additional factors such as  advanced age; significant coronary or neurologic disease,etc). Check the A1c every 6 months if it is < 6.5%; every 4 months if  6.5%.          Goals for home glucose monitoring are : fasting  or morning glucose goal of  90-150. Two hours after any meal , goal = < 180, preferably < 150.       The following questions were asked concerning diabetes status. Have  you had excess thirst, excess hunger or excess urination. Have you had lightheadedness with standing, ,chest pain ; palpitations; or pain in  calves with walking.Are there any new ulcers or sores on the skin which are nonhealing especially over the feet. Has there  been a significant change in  weight. Are you  exercising. What is the value of your fasting sugar in the morning before eating ; what was the highest sugar 2 hours after a meal. Do you have numbness or tingling or burning in your feet.  All answers were negative. He  has been using the treadmill 3-4 times a week walking 2 miles without symptoms. Fasting blood sugars range from 88-117. The highest dose 2 hours after meal was less than 150.          Review of Systems  he has been having pain in the sole of the left foot anterior to the  Calcaneus  when he walks . This pain is intermittent and worse at night when he is walking to the bathroom. Aleve has been of benefit.  His compliance with the pravastatin has been good. He denies any adverse effects such as myalgias or GI symptoms.  He's developed a non pruritic rash over the right upper quadrant in the last 3-4 weeks. He was treated with a topical agent for similar rash in 1991 after he  returned from [place].     Objective:   Physical Exam  he appears healthy and well-nourished  The skin is clear except for a 5 x 14 mm slightly irregular hyperpigmented rash in the upper quadrant on the right of the abdomen.  Chest clear to auscultation without rales, rhonchi or wheezes.  He exhibits a Grade 1/2  systolic murmur; rhythm is normal as his rate.  He has no organomegaly or masses in the abdomen is nontender.  All pulses are intact.  Sensation to light touch of the feet is normal. Mental health is excellent.  He does exhibit pes planus. The sole on the left is nontender to palpation or percussion.          Assessment & Plan:   #1 diabetes; home glucose recording suggests excellent control.  #2 dyslipidemia; and the LDL or bad cholesterol is not a goal of less than 100  #3 clinical plantar fasciitis  #4 rash,  bland  Plan: #1the A1c will be checked; the metformin may need to be decreased. A  #2 pravastatin will be changed to simvastatin 20 mg daily  #3 exercises will be recommended for the plantar fasciitis. If symptoms persist referral to podiatrist would be recommended  #4 topical mixed agent will be prescribed for the rash.

## 2011-02-10 LAB — LIPID PANEL
HDL: 54.7 mg/dL (ref >39.00)
LDL Cholesterol: 75 mg/dL (ref 0–99)
Total CHOL/HDL Ratio: 3
Triglycerides: 90 mg/dL (ref 0.0–149.0)

## 2011-02-10 LAB — BUN: BUN: 14 mg/dL (ref 6–23)

## 2011-07-26 ENCOUNTER — Ambulatory Visit (INDEPENDENT_AMBULATORY_CARE_PROVIDER_SITE_OTHER): Payer: Medicare Other | Admitting: Internal Medicine

## 2011-07-26 ENCOUNTER — Encounter: Payer: Self-pay | Admitting: Internal Medicine

## 2011-07-26 DIAGNOSIS — E785 Hyperlipidemia, unspecified: Secondary | ICD-10-CM

## 2011-07-26 DIAGNOSIS — E119 Type 2 diabetes mellitus without complications: Secondary | ICD-10-CM

## 2011-07-26 LAB — HEPATIC FUNCTION PANEL
ALT: 18 U/L (ref 0–53)
AST: 29 U/L (ref 0–37)
Alkaline Phosphatase: 54 U/L (ref 39–117)
Bilirubin, Direct: 0.1 mg/dL (ref 0.0–0.3)
Total Bilirubin: 0.9 mg/dL (ref 0.3–1.2)

## 2011-07-26 LAB — LIPID PANEL
Cholesterol: 204 mg/dL — ABNORMAL HIGH (ref 0–200)
HDL: 57.7 mg/dL (ref >39.00)
Total CHOL/HDL Ratio: 4
Triglycerides: 35 mg/dL (ref 0.0–149.0)
VLDL: 7 mg/dL (ref 0.0–40.0)

## 2011-07-26 LAB — BASIC METABOLIC PANEL
BUN: 16 mg/dL (ref 6–23)
Calcium: 9.3 mg/dL (ref 8.4–10.5)
Chloride: 105 mEq/L (ref 96–112)
Creatinine, Ser: 0.9 mg/dL (ref 0.4–1.5)
GFR: 110.86 mL/min (ref >60.00)

## 2011-07-26 MED ORDER — METFORMIN HCL 500 MG PO TABS: 500.0000 mg | ORAL_TABLET | Freq: Two times a day (BID) | ORAL | Status: DC

## 2011-07-26 NOTE — Progress Notes (Signed)
  Subjective:    Patient ID: Robert Warner, male    DOB: May 01, 1941, 70 y.o.   MRN: [id number]  HPI #1 HYPERLIPIDEMIA: Chest pain, palpitations- no       Dyspnea- no Medications: Compliance- Rx not refilled  Lightheadedness,Syncope- no    Edema- no Abd pain, bowel changes- no   Muscle aches- no  #2 DIABETES: Disease Monitoring: Blood Sugar ranges-89-115; 2 hrs  Post meal < 130  Polyuria/phagia/dipsia- no       Visual problems- no; last exam 2011 Medications: Compliance- yes  Hypoglycemic symptoms- no  Smoking Status noted :quit 1967        Review of Systems     Objective:   Physical Exam Gen.: Healthy  & well-nourished, appropriate and alert, weight good Eyes: No lid/conjunctival changes, extraocular motion intact, fundi visualization suboptimal, arcus senilis Neck: Normal range of motion, thyroid normal Respiratory: No increased work of breathing or abnormal breath sounds Cardiac : regular rhythm, no extra heart sounds, gallop, murmur Skin: No rashes, lesions, ulcers or ischemic changes Muscle skeletal: no nail changes; joints normal Vasc:All pulses intact, no bruits present Neuro: Normal deep tendon reflexes, alert & oriented, sensation over feetnormal Psych: judgment and insight, mood and affect normal         Assessment & Plan:  #1 diabetes; home recordings suggests excellent control  #2 dyslipidemia, presently off statin  #3 blood pressure mildly elevated.  Plan: see orders and recommendations.

## 2011-07-26 NOTE — Patient Instructions (Signed)
Eat a low-fat diet with lots of fruits and vegetables, up to 7-9 servings per day. Avoid obesity; your goal is waist measurement < 40 inches.Consume less than 40 grams of sugar per day from foods & drinks with High Fructose Corn Sugar as #1,2,3 or # 4 on label. Follow the low carb nutrition program in [employer] as closely as possible to prevent Diabetes progression & complications. White carbohydrates (potatoes, rice, bread, and pasta) have a high spike of sugar and a high load of sugar. For example a  baked potato has a cup of sugar and a  french fry  2 teaspoons of sugar. Yams, wild  rice, whole grained bread &  wheat pasta have been much lower spike and load of  sugar. Portions should be the size of a deck of cards or your palm. Blood Pressure Goal  Ideally is an AVERAGE < 135/85. This AVERAGE should be calculated from @ least 5-7 BP readings taken @ different times of day on different days of week. You should not respond to isolated BP readings , but rather the AVERAGE for that week  

## 2011-08-02 ENCOUNTER — Telehealth: Payer: Self-pay

## 2011-08-02 MED ORDER — PRAVASTATIN SODIUM 20 MG PO TABS: 20.0000 mg | ORAL_TABLET | Freq: Every day | ORAL | Status: DC

## 2011-08-02 NOTE — Telephone Encounter (Signed)
Message copied by Edgardo Roys on Wed Aug 02, 2011  9:37 AM ------      Message from: Pecola Lawless      Created: Fri Jul 28, 2011 10:36 AM       Diabetes Monitor:       The A1c test is used primarily to monitor the glucose control of diabetics over time. The goal of those with diabetes is to keep their blood glucose levels as close to normal as possible. This helps to minimize the complications caused by chronically elevated glucose levels, such as progressive damage to body organs like the kidneys, eyes, cardiovascular system, and nerves. The A1c test gives a picture of the average amount of glucose in the blood over the last few months. It can help a patient and his doctor know if the measures they are taking to control the patient's diabetes are successful or need to be adjusted.       NORMAL VALUES       Non diabetic adults: 5 %-6.1%       Good diabetic control: 6.2-6.4 %       Fair diabetic control: 6.5-7%       Poor diabetic control: greater than 7 % ( except with additional factors such as  advanced age; significant coronary or neurologic disease,etc). Check the A1c every  4 months as it is  6.5% or higher. Goals for home glucose monitoring are : fasting  or morning glucose goal of  90-150. Two hours after any meal , goal = < 180, preferably < 150.      With Diabetes minimal  LDL or BAD cholesterol goal = < 100, ideally < 70. Please consider pravastatin 20 mg at bedtime with repeat fasting labs in 10 weeks (lipids, hepatic panel, CK; 272.4, 995.20). This medicine would cost $10 for 90 pills at [facility] or  [facility], if insurance not used. [employer]

## 2011-08-02 NOTE — Telephone Encounter (Signed)
RX and labs mailed to patient  

## 2011-09-03 ENCOUNTER — Encounter: Payer: Self-pay | Admitting: Internal Medicine

## 2012-01-25 DIAGNOSIS — N529 Male erectile dysfunction, unspecified: Secondary | ICD-10-CM | POA: Diagnosis not present

## 2012-01-25 DIAGNOSIS — N401 Enlarged prostate with lower urinary tract symptoms: Secondary | ICD-10-CM | POA: Diagnosis not present

## 2012-02-22 DIAGNOSIS — T169XXA Foreign body in ear, unspecified ear, initial encounter: Secondary | ICD-10-CM | POA: Diagnosis not present

## 2012-03-12 ENCOUNTER — Encounter: Payer: Self-pay | Admitting: Internal Medicine

## 2012-03-12 ENCOUNTER — Ambulatory Visit (INDEPENDENT_AMBULATORY_CARE_PROVIDER_SITE_OTHER): Payer: Medicare Other | Admitting: Internal Medicine

## 2012-03-12 VITALS — BP 132/98 | HR 72 | Temp 97.8°F | Wt 210.0 lb

## 2012-03-12 DIAGNOSIS — I10 Essential (primary) hypertension: Secondary | ICD-10-CM | POA: Insufficient documentation

## 2012-03-12 DIAGNOSIS — E119 Type 2 diabetes mellitus without complications: Secondary | ICD-10-CM | POA: Diagnosis not present

## 2012-03-12 DIAGNOSIS — E785 Hyperlipidemia, unspecified: Secondary | ICD-10-CM

## 2012-03-12 DIAGNOSIS — R030 Elevated blood-pressure reading, without diagnosis of hypertension: Secondary | ICD-10-CM

## 2012-03-12 MED ORDER — RAMIPRIL 2.5 MG PO CAPS: 2.5000 mg | ORAL_CAPSULE | Freq: Every day | ORAL | Status: DC

## 2012-03-12 MED ORDER — METFORMIN HCL ER (MOD) 500 MG PO TB24: ORAL_TABLET | ORAL | Status: DC

## 2012-03-12 NOTE — Assessment & Plan Note (Signed)
He is not on a statin at this time; LDL was 138.2 prior to the statin prescription. Minimal LDL goal is less than 100

## 2012-03-12 NOTE — Progress Notes (Signed)
  Subjective:    Patient ID: Robert Warner, male    DOB: August 30, 1941, 71 y.o.   MRN: [id number]  HPI  DIABETES: Disease Monitoring: Blood Sugar ranges-77-135  Polyuria/phagia/dipsia- no       Visual problems- no; last Ophth exam > 1 year Medications: Compliance- yes, but he questioned cramping & gas due  to  metformin Hypoglycemic symptoms- no Foot care: no   HYPERLIPIDEMIA: Chest pain, palpitations- no       Dyspnea- no Medications: Compliance- after 90 days of statin it  was discontinued. He did not realize he was to return for fasting labs on the statin. He denied associated abdominal pain or myalgias  HYPERTENSION: see VS Disease Monitoring: Blood pressure range-not monitoring BP  Lightheadedness,Syncope- no    Edema- no           Review of Systems  He denies dysphagia, significant heartburn, unexplained weight loss, melena rectal bleeding. He has had some constipation but no diarrhea.  He denies polyuria during the day but does have nocturia 3-4 times a night. His wife states  he snores; she does not describe apnea     Objective:   Physical Exam Gen.: Healthy and well-nourished in appearance. Alert, appropriate and cooperative throughout exam.Appears younger than stated age  Head: Normocephalic without obvious abnormalities;   Eyes: No corneal or conjunctival inflammation noted. Pterygiae & arcus senilis Nose: External nasal exam reveals no deformity or inflammation. Nasal mucosa are pink and moist. No lesions or exudates noted.  Mouth: Oral mucosa and oropharynx reveal no lesions or exudates. Teeth in good repair; upper partial. Neck: No deformities, masses, or tenderness noted. Range of motion decreased. Thyroid normal. Lungs: Normal respiratory effort; chest expands symmetrically. Lungs are clear to auscultation without rales, wheezes, or increased work of breathing. Heart: Normal rate and rhythm. Normal S1 ; accentuated S2. No gallop, click, or rub. No  murmur. Abdomen: Bowel sounds normal; abdomen soft and nontender. No masses, organomegaly or hernias noted.  Musculoskeletal/extremities: No clubbing, cyanosis, edema, or deformity noted. Range of motion  normal .Tone & strength  normal.Joints normal. Nail health  good. Vascular: Carotid, radial artery, dorsalis pedis and  posterior tibial pulses are full and equal. No bruits present. Neurologic: Alert and oriented x3. Deep tendon reflexes symmetrical and normal. Light touch normal over feet.        Skin: Intact without suspicious lesions or rashes. Lymph: No cervical, axillary lymphadenopathy present. Psych: Mood and affect are normal. Normally interactive                                                                                         Assessment & Plan:

## 2012-03-12 NOTE — Patient Instructions (Signed)
Please  schedule fasting Labs in am : BMET,Lipids, hepatic panel, A1c, urine microalbumin.  PLEASE BRING THESE INSTRUCTIONS TO FOLLOW UP  LAB APPOINTMENT.This will guarantee correct labs are drawn, eliminating need for repeat blood sampling ( needle sticks ! ). Diagnoses /Codes: 250.00;272.4 Please try to go on My Chart within the next 24 hours to allow me to release the results directly to you.

## 2012-03-12 NOTE — Assessment & Plan Note (Signed)
His last A1c was 6.6 which indicates good diabetic control. Because of cramping gas, metformin will be changed to an extended release form.

## 2012-03-13 ENCOUNTER — Other Ambulatory Visit (INDEPENDENT_AMBULATORY_CARE_PROVIDER_SITE_OTHER): Payer: Medicare Other

## 2012-03-13 DIAGNOSIS — E119 Type 2 diabetes mellitus without complications: Secondary | ICD-10-CM

## 2012-03-13 DIAGNOSIS — E785 Hyperlipidemia, unspecified: Secondary | ICD-10-CM

## 2012-03-13 DIAGNOSIS — IMO0001 Reserved for inherently not codable concepts without codable children: Secondary | ICD-10-CM

## 2012-03-13 LAB — HEPATIC FUNCTION PANEL
Albumin: 4 g/dL (ref 3.5–5.2)
Alkaline Phosphatase: 58 U/L (ref 39–117)
Total Protein: 7.1 g/dL (ref 6.0–8.3)

## 2012-03-13 LAB — MICROALBUMIN / CREATININE URINE RATIO
Creatinine,U: 107.9 mg/dL
Microalb Creat Ratio: 0.4 mg/g (ref 0.0–30.0)

## 2012-03-13 LAB — LIPID PANEL
Cholesterol: 190 mg/dL (ref 0–200)
HDL: 59.1 mg/dL (ref >39.00)
LDL Cholesterol: 120 mg/dL — ABNORMAL HIGH (ref 0–99)
Total CHOL/HDL Ratio: 3
Triglycerides: 53 mg/dL (ref 0.0–149.0)
VLDL: 10.6 mg/dL (ref 0.0–40.0)

## 2012-03-13 LAB — HEMOGLOBIN A1C: Hgb A1c MFr Bld: 6.6 % — ABNORMAL HIGH (ref 4.6–6.5)

## 2012-03-13 LAB — BASIC METABOLIC PANEL
CO2: 28 mEq/L (ref 19–32)
Chloride: 105 mEq/L (ref 96–112)
Potassium: 4.7 mEq/L (ref 3.5–5.1)
Sodium: 141 mEq/L (ref 135–145)

## 2012-03-13 NOTE — Progress Notes (Signed)
Lab only 

## 2012-03-14 ENCOUNTER — Encounter: Payer: Self-pay | Admitting: Internal Medicine

## 2012-03-15 ENCOUNTER — Other Ambulatory Visit: Payer: Self-pay

## 2012-03-15 ENCOUNTER — Other Ambulatory Visit: Payer: Medicare Other

## 2012-03-15 MED ORDER — GLUCOSE BLOOD VI STRP: ORAL_STRIP | Status: DC

## 2012-03-15 MED ORDER — FREESTYLE LANCETS MISC: Status: DC

## 2012-03-15 MED ORDER — PRAVASTATIN SODIUM 20 MG PO TABS: 20.0000 mg | ORAL_TABLET | Freq: Every day | ORAL | Status: DC

## 2012-03-15 NOTE — Telephone Encounter (Signed)
Message copied by Maurice Small on Fri Mar 15, 2012  8:17 AM ------      Message from: Pecola Lawless      Created: Fri Mar 15, 2012  8:05 AM       Please call Rx  to drug store for Pravastatin 20 mg # 90. Labs sent through My Chart

## 2012-03-15 NOTE — Telephone Encounter (Signed)
Addended by: Maurice Small on: 03/15/2012 08:25 AM   Modules accepted: Orders

## 2012-03-15 NOTE — Telephone Encounter (Signed)
RX sent

## 2012-03-20 DIAGNOSIS — H4011X0 Primary open-angle glaucoma, stage unspecified: Secondary | ICD-10-CM | POA: Diagnosis not present

## 2012-03-20 DIAGNOSIS — H2510 Age-related nuclear cataract, unspecified eye: Secondary | ICD-10-CM | POA: Diagnosis not present

## 2012-04-29 DIAGNOSIS — N401 Enlarged prostate with lower urinary tract symptoms: Secondary | ICD-10-CM | POA: Diagnosis not present

## 2012-04-29 DIAGNOSIS — N529 Male erectile dysfunction, unspecified: Secondary | ICD-10-CM | POA: Diagnosis not present

## 2012-06-11 ENCOUNTER — Ambulatory Visit (INDEPENDENT_AMBULATORY_CARE_PROVIDER_SITE_OTHER): Payer: Medicare Other | Admitting: Internal Medicine

## 2012-06-11 ENCOUNTER — Encounter: Payer: Self-pay | Admitting: Internal Medicine

## 2012-06-11 VITALS — BP 118/80 | HR 57 | Wt 202.0 lb

## 2012-06-11 DIAGNOSIS — E785 Hyperlipidemia, unspecified: Secondary | ICD-10-CM

## 2012-06-11 DIAGNOSIS — E119 Type 2 diabetes mellitus without complications: Secondary | ICD-10-CM

## 2012-06-11 DIAGNOSIS — N400 Enlarged prostate without lower urinary tract symptoms: Secondary | ICD-10-CM | POA: Diagnosis not present

## 2012-06-11 DIAGNOSIS — R030 Elevated blood-pressure reading, without diagnosis of hypertension: Secondary | ICD-10-CM | POA: Diagnosis not present

## 2012-06-11 LAB — LIPID PANEL
Cholesterol: 158 mg/dL (ref 0–200)
LDL Cholesterol: 87 mg/dL (ref 0–99)
Triglycerides: 47 mg/dL (ref 0.0–149.0)
VLDL: 9.4 mg/dL (ref 0.0–40.0)

## 2012-06-11 LAB — MICROALBUMIN / CREATININE URINE RATIO: Creatinine,U: 85.7 mg/dL

## 2012-06-11 NOTE — Assessment & Plan Note (Addendum)
A1c was 6.6% on 03/13/12. This will be rechecked in the context of his weight loss.

## 2012-06-11 NOTE — Progress Notes (Signed)
  Subjective:    Patient ID: Robert Warner, male    DOB: 1940/12/19, 71 y.o.   MRN: [id number]  HPI Elevated BP w/o diagnosis of HYPERTENSION: He is on ACE inhibitor prophylactically. He has never had sustained elevation of  blood pressure Disease Monitoring: Blood pressure average-123/80  Chest pain, palpitations- no       Dyspnea- no Medications: Compliance-yes Lightheadedness,Syncope- no    Edema- no  DIABETES: Disease Monitoring: Blood Sugar ranges-80- 128  Polyuria/phagia/dipsia- no       Visual problems-no Medications: Compliance- yes Ophth exam-June/13; no retinopathy. Slight change in lens prescription  Hypoglycemic symptoms- no  HYPERLIPIDEMIA: Disease Monitoring: See symptoms for Hypertension Medications: Compliance- yes; Pravastatin ran out last week Abd pain, bowel changes- no   Muscle aches- no          Review of Systems He sees his urologist annually. His prostate biopsy 2006 was negative. He denies hematuria, pyuria, or dysuria. He is on Rapaflo   for his BPH; he denies any voiding or incontinence issues     Objective:   Physical Exam Gen.: Healthy and well-nourished in appearance. Alert, appropriate and cooperative throughout exam.Appears younger than stated age  Eyes: No corneal or conjunctival inflammation noted. Vision grossly normal with lenses.  Neck: No deformities, masses, or tenderness noted.  Thyroid normal. Lungs: Normal respiratory effort; chest expands symmetrically. Lungs are clear to auscultation without rales, wheezes, or increased work of breathing. Heart: Normal rate and rhythm. Normal S1 and S2. No gallop, click, or rub.S 4 w/o murmur. Abdomen: Bowel sounds normal; abdomen soft and nontender. No masses, organomegaly or hernias noted. Genitalia: Dr Brunilda Payor .                                                                                   Musculoskeletal/extremities:  No clubbing, cyanosis, edema, or deformity noted. Range of motion   normal .Tone & strength  normal.Joints normal. Nail health  good. Vascular: Carotid, radial artery, dorsalis pedis and  posterior tibial pulses are full and equal. No bruits present. Neurologic: Alert and oriented x3. Deep tendon reflexes symmetrical and normal.  Light touch normal over feet.          Skin: Intact without suspicious lesions or rashes. Lymph: No cervical, axillary lymphadenopathy present. Psych: Mood and affect are normal. Normally interactive                                                                                         Assessment & Plan:

## 2012-06-11 NOTE — Patient Instructions (Addendum)
Please try to go on My Chart within the next 24 hours to allow me to release the results directly to you.  

## 2012-06-11 NOTE — Assessment & Plan Note (Addendum)
He has been off his statin for less than a week. He has lost 10 pounds with lifestyle change and decreased portions and exercise. LDL was 120 on 03/13/12. Lipids will be rechecked

## 2012-06-11 NOTE — Assessment & Plan Note (Signed)
And a sustained hypertension has never been present. Blood pressures are excellent. The ramipril is prophylactic. His creatinine was 1.0 on 03/13/12.

## 2012-07-26 ENCOUNTER — Other Ambulatory Visit: Payer: Self-pay | Admitting: Internal Medicine

## 2012-07-26 DIAGNOSIS — Z23 Encounter for immunization: Secondary | ICD-10-CM | POA: Diagnosis not present

## 2012-08-25 ENCOUNTER — Other Ambulatory Visit: Payer: Self-pay | Admitting: Internal Medicine

## 2012-10-28 ENCOUNTER — Other Ambulatory Visit (INDEPENDENT_AMBULATORY_CARE_PROVIDER_SITE_OTHER): Payer: Medicare Other

## 2012-10-28 DIAGNOSIS — R7309 Other abnormal glucose: Secondary | ICD-10-CM

## 2012-10-28 DIAGNOSIS — E785 Hyperlipidemia, unspecified: Secondary | ICD-10-CM

## 2012-10-28 LAB — LIPID PANEL
Cholesterol: 198 mg/dL (ref 0–200)
LDL Cholesterol: 128 mg/dL — ABNORMAL HIGH (ref 0–99)
Triglycerides: 51 mg/dL (ref 0.0–149.0)

## 2012-10-28 LAB — HEMOGLOBIN A1C: Hgb A1c MFr Bld: 6.8 % — ABNORMAL HIGH (ref 4.6–6.5)

## 2012-11-18 ENCOUNTER — Encounter: Payer: Self-pay | Admitting: Internal Medicine

## 2012-11-18 ENCOUNTER — Ambulatory Visit (INDEPENDENT_AMBULATORY_CARE_PROVIDER_SITE_OTHER): Payer: Medicare Other | Admitting: Internal Medicine

## 2012-11-18 VITALS — BP 138/92 | HR 74 | Wt 207.0 lb

## 2012-11-18 DIAGNOSIS — R030 Elevated blood-pressure reading, without diagnosis of hypertension: Secondary | ICD-10-CM

## 2012-11-18 DIAGNOSIS — E119 Type 2 diabetes mellitus without complications: Secondary | ICD-10-CM

## 2012-11-18 DIAGNOSIS — E785 Hyperlipidemia, unspecified: Secondary | ICD-10-CM | POA: Diagnosis not present

## 2012-11-18 MED ORDER — SIMVASTATIN 20 MG PO TABS: 20.0000 mg | ORAL_TABLET | Freq: Every day | ORAL | Status: DC

## 2012-11-18 MED ORDER — RAMIPRIL 5 MG PO CAPS: 5.0000 mg | ORAL_CAPSULE | Freq: Every day | ORAL | Status: DC

## 2012-11-18 NOTE — Assessment & Plan Note (Signed)
Change Pravastatin to Simvastatin 20 mg; lipids 4 mos

## 2012-11-18 NOTE — Assessment & Plan Note (Signed)
Metformin 500 mg bid with meals; repeat A1c & urine microalbumin in 4 months

## 2012-11-18 NOTE — Patient Instructions (Addendum)
Please  schedule fasting Labs in 4 months with appt 2-3 days later : BMET,Lipids, hepatic panel, A1c, urine microalbumin,TSH.PLEASE BRING THESE INSTRUCTIONS TO FOLLOW UP  LAB APPOINTMENT.This will guarantee correct labs are drawn, eliminating need for repeat blood sampling ( needle sticks ! ). Diagnoses /Codes: 272.4,995.20,250.00.

## 2012-11-18 NOTE — Progress Notes (Signed)
  Subjective:    Patient ID: Robert Warner, male    DOB: May 01, 1941, 72 y.o.   MRN: [id number]  HPI The patient is here for followup of diabetes, hyperlipidemia, and hypertension.  The most recent A1c 10/28/12  was 6.8% , which correlates to an average sugar of 148 , and long-term risk of 36 % . Fasting blood sugar ranges 98-100  .  Highest two-hour postprandial glucose is < 135 . No hypoglycemia Last ophthalmologic examination < 12 mos   revealed no retinopathy. No active podiatry assessment on record. Diet is smaller portion  . Medication: Metformin increased  To twice a day with A1c of 6.8 % Exercise 4-5 X / week up to 2 X/ day for 30-45 min .  The most recent lipids 12/16  reveal LDL 128  , HDL 60.2  , and triglycerides 51   . The statin was not refilled.  Blood pressure range < 140/90   . There is medical compliance with antihypertensive medications         Review of Systems Constitutional: No  significant weight change, fatigue, weakness or night sweats Eyes: No  blurred vision, double vision, or loss of vision Cardiovascular: no chest pain, palpitations, racing, irregular rhythm,syncope,nausea,sweating, claudication, or edema  Respiratory: No exertinal dyspnea, paroxysmal nocturnal dyspnea Genitourinary: No dysuria,hematuria, pyuria, frequency,  incontinence, nocturia. Nocturia 3-5X/ night ; Dr Brunilda Payor Rxed Rapiflo Musculoskeletal: No myalgias or muscle cramping , weakness Dermatologic: No change in color or temperature of skin Neurologic: No   numbness or tingling Endocrine: No change in hair/skin/ nails, excessive thirst, excessive hunger,or  excessive urination     Objective:   Physical Exam  Gen.:  well-nourished in appearance. Alert, appropriate and cooperative throughout exam. Eyes: No corneal or conjunctival inflammation noted.  Neck: No deformities, masses, or tenderness noted.  Thyroid normal. Lungs: Normal respiratory effort; chest expands symmetrically.  Lungs are clear to auscultation without rales, wheezes, or increased work of breathing. Heart: Normal rate and rhythm. Normal S1 and S2. No gallop, click, or rub. S4 with slurring w/o murmur. Abdomen: Bowel sounds normal; abdomen soft and nontender. No masses, organomegaly or hernias noted.                                               Musculoskeletal/extremities:  No clubbing, cyanosis, edema, or deformity noted. Joints normal. Nail health  good. Vascular: Carotid, radial artery, dorsalis pedis and  posterior tibial pulses are full and equal. No bruits present. Neurologic: Alert and oriented x3. Deep tendon reflexes symmetrical and normal.  Light touch normal over feet.         Skin: Intact without suspicious lesions or rashes. Lymph: No cervical, axillary lymphadenopathy present. Psych: Mood and affect are normal. Normally interactive                                                                                       Assessment & Plan:

## 2012-11-18 NOTE — Assessment & Plan Note (Signed)
Ramipril 5 mg daily

## 2012-12-02 ENCOUNTER — Ambulatory Visit: Payer: Medicare Other

## 2012-12-02 DIAGNOSIS — I10 Essential (primary) hypertension: Secondary | ICD-10-CM

## 2012-12-02 NOTE — Progress Notes (Signed)
[facility] EKG 

## 2013-03-17 ENCOUNTER — Encounter: Payer: Self-pay | Admitting: Internal Medicine

## 2013-03-24 ENCOUNTER — Encounter: Payer: Self-pay | Admitting: Internal Medicine

## 2013-03-24 ENCOUNTER — Ambulatory Visit (INDEPENDENT_AMBULATORY_CARE_PROVIDER_SITE_OTHER): Payer: Medicare Other | Admitting: Internal Medicine

## 2013-03-24 VITALS — BP 128/80 | HR 54 | Wt 205.0 lb

## 2013-03-24 DIAGNOSIS — E785 Hyperlipidemia, unspecified: Secondary | ICD-10-CM

## 2013-03-24 DIAGNOSIS — E119 Type 2 diabetes mellitus without complications: Secondary | ICD-10-CM | POA: Diagnosis not present

## 2013-03-24 LAB — LDL CHOLESTEROL, DIRECT: Direct LDL: 64.7 mg/dL

## 2013-03-24 NOTE — Patient Instructions (Signed)
Share results with all non [facility] medical staff seen  

## 2013-03-24 NOTE — Assessment & Plan Note (Signed)
A1c & urine microalbumin  

## 2013-03-24 NOTE — Progress Notes (Signed)
Subjective:    Patient ID: Robert Warner, male    DOB: 1941-01-10, 72 y.o.   MRN: [id number]  HPI Diabetes status assessment: Fasting or morning glucose range  is in 80-90s. Highest glucose 2 hours is < 130. No hypoglycemia reported                                                                                                                 Medication compliance is good. No medication adverse effects noted. Eye exam current. Foot care not current  He is on a heart healthy diet; he exercises > 30 minutes 4 times per week without symptoms. Specifically he denies chest pain, palpitations, dyspnea, or claudication. Family history is negative for premature coronary disease. Because of Diabetes cholesterol  LDL goal is less than 100, ideally < 70.  Labs performed at his urologist office 5/5. Fasting glucose  117 ; CK 207; triglycerides 49; & total cholesterol.    Review of Systems  No excess thirst ;  excess hunger ; or excess urination reported .Frequency improved after Urology trial                           No lightheadedness with standing reported                                                                                                                            No non healing skin  ulcers or sores of extremities noted. No numbness or tingling or burning in feet described                                                                                                                                             No significant change in weight . No blurred,double, or loss of vision reported  .  Objective:   Physical Exam Appears healthy and well-nourished & in no acute distress  No carotid bruits are present.No neck pain distention present at 10 - 15 degrees. Thyroid normal to palpation  Heart rhythm and rate are normal ; grade 1/2 systolic murmurs .  Chest is clear with no increased work of breathing  There is no evidence of aortic aneurysm or renal artery  bruits  Abdomen soft with no organomegaly or masses. No HJR  No clubbing, cyanosis or edema present.  Pedal pulses are intact . Light touch normal over feet.  No ischemic skin changes are present . Nails healthy    Alert and oriented. Strength, tone normal          Assessment & Plan:  See Current Assessment & Plan in Problem List under specific Diagnosis

## 2013-03-24 NOTE — Assessment & Plan Note (Signed)
Minimal CK elevation with normal LFTs. Check LDL

## 2013-04-21 DIAGNOSIS — N401 Enlarged prostate with lower urinary tract symptoms: Secondary | ICD-10-CM | POA: Diagnosis not present

## 2013-04-23 ENCOUNTER — Other Ambulatory Visit: Payer: Self-pay | Admitting: Internal Medicine

## 2013-04-28 DIAGNOSIS — N401 Enlarged prostate with lower urinary tract symptoms: Secondary | ICD-10-CM | POA: Diagnosis not present

## 2013-06-13 LAB — HM DIABETES EYE EXAM

## 2013-06-18 ENCOUNTER — Other Ambulatory Visit: Payer: Self-pay

## 2013-07-29 DIAGNOSIS — E1139 Type 2 diabetes mellitus with other diabetic ophthalmic complication: Secondary | ICD-10-CM | POA: Diagnosis not present

## 2013-07-29 DIAGNOSIS — H2510 Age-related nuclear cataract, unspecified eye: Secondary | ICD-10-CM | POA: Diagnosis not present

## 2013-08-04 DIAGNOSIS — Z23 Encounter for immunization: Secondary | ICD-10-CM | POA: Diagnosis not present

## 2013-09-18 ENCOUNTER — Other Ambulatory Visit: Payer: Self-pay

## 2013-10-15 ENCOUNTER — Encounter: Payer: Self-pay | Admitting: Internal Medicine

## 2013-10-15 ENCOUNTER — Ambulatory Visit (INDEPENDENT_AMBULATORY_CARE_PROVIDER_SITE_OTHER): Payer: Medicare Other | Admitting: Internal Medicine

## 2013-10-15 VITALS — BP 126/75 | HR 55 | Temp 98.3°F | Ht 71.75 in | Wt 208.2 lb

## 2013-10-15 DIAGNOSIS — E785 Hyperlipidemia, unspecified: Secondary | ICD-10-CM

## 2013-10-15 DIAGNOSIS — Z23 Encounter for immunization: Secondary | ICD-10-CM | POA: Diagnosis not present

## 2013-10-15 DIAGNOSIS — E119 Type 2 diabetes mellitus without complications: Secondary | ICD-10-CM

## 2013-10-15 DIAGNOSIS — R030 Elevated blood-pressure reading, without diagnosis of hypertension: Secondary | ICD-10-CM | POA: Diagnosis not present

## 2013-10-15 LAB — LIPID PANEL
Cholesterol: 195 mg/dL (ref 0–200)
HDL: 54.2 mg/dL (ref >39.00)
Triglycerides: 62 mg/dL (ref 0.0–149.0)

## 2013-10-15 LAB — BASIC METABOLIC PANEL
BUN: 12 mg/dL (ref 6–23)
CO2: 29 mEq/L (ref 19–32)
Calcium: 9.3 mg/dL (ref 8.4–10.5)
Chloride: 104 mEq/L (ref 96–112)
GFR: 92.75 mL/min (ref >60.00)
Glucose, Bld: 119 mg/dL — ABNORMAL HIGH (ref 70–99)
Potassium: 4.6 mEq/L (ref 3.5–5.1)
Sodium: 139 mEq/L (ref 135–145)

## 2013-10-15 LAB — MICROALBUMIN / CREATININE URINE RATIO
Microalb Creat Ratio: 1.8 mg/g (ref 0.0–30.0)
Microalb, Ur: 1.4 mg/dL (ref 0.0–1.9)

## 2013-10-15 LAB — TSH: TSH: 1.25 u[IU]/mL (ref 0.35–5.50)

## 2013-10-15 MED ORDER — METFORMIN HCL ER (MOD) 500 MG PO TB24: ORAL_TABLET | ORAL | Status: DC

## 2013-10-15 NOTE — Assessment & Plan Note (Addendum)
Urine microalbumin Purpose of ACE-I explained BMET

## 2013-10-15 NOTE — Patient Instructions (Signed)
Your next office appointment will be determined based upon review of your pending labs. Those instructions will be transmitted to you through My Chart . 

## 2013-10-15 NOTE — Assessment & Plan Note (Signed)
Lipids off statin

## 2013-10-15 NOTE — Assessment & Plan Note (Signed)
A1c

## 2013-10-15 NOTE — Progress Notes (Signed)
Pre visit review using our clinic review tool, if applicable. No additional management support is needed unless otherwise documented below in the visit note. 

## 2013-10-15 NOTE — Progress Notes (Signed)
Subjective:    Patient ID: Robert Warner, male    DOB: 11/04/1941, 72 y.o.   MRN: [id number]  HPI  His blood pressures been averaging 133/79; he's been off the ACE inhibitor for approximately 6 months. When his blood pressure normalized he felt that the medicine service purpose.  He is on metformin 500 mg twice a day. He believes that this does cause some epigastric gas discomfort occasionally. This will respond to drinking water.  His fasting blood sugars over the last 3 months have ranged from 84-116. His glucose 2 hours after any meals less than 120.  He is on a heart healthy diet. He exercises 3-4 times per week. He sees his ophthalmologist annually. His podiatry evaluation is not current  He denies any hypoglycemic spells  Head/the A1c was 6.4 and urine microalbumin 0.5 in May of this year.  He also has stopped the simvastatin; his last LDL was 64.7 in May.    Review of Systems   Despite the epigastric discomfort he denies any unexplained weight loss, melena, rectal bleeding.  Other negative symptoms include headaches,exertional  chest pain,palpitations, claudication, or paroxysmal nocturnal dyspnea.  He also has no exertional dyspnea, positional lightheadedness, or syncope.  There is no significant edema.  Polydipsia, polyphagia, polyuria not present  There is no numbness or tingling in his arms or legs. There are no visual changes blurring, diplopia, visual loss.  He has no significant myalgias. No non healing skin lesions     Objective:   Physical Exam Gen.: Healthy and well-nourished in appearance. Alert, appropriate and cooperative throughout exam.Appears younger than stated age  Head: Normocephalic without obvious abnormalities  Eyes: No corneal or conjunctival inflammation noted. Extraocular motion intact. Arcus bilaterally Nose: External nasal exam reveals no deformity or inflammation. Nasal mucosa are pink and moist. No lesions or exudates noted.   Mouth:  Oral mucosa and oropharynx reveal no lesions or exudates. Teeth in good repair. Upper partial Neck: No deformities, masses, or tenderness noted.  Thyroid normal. Lungs: Normal respiratory effort; chest expands symmetrically. Lungs are clear to auscultation without rales, wheezes, or increased work of breathing. Heart: Normal rate and rhythm. Normal S1 and S2. No gallop, click, or rub. Faint R base Grade 1/2 systolic murmur. Abdomen: Bowel sounds normal; abdomen soft and nontender. No masses, organomegaly or hernias noted.                                 Musculoskeletal/extremities: No deformity or scoliosis noted of  the thoracic or lumbar spine.   No clubbing, cyanosis, edema, or significant extremity  deformity noted. Tone & strength normal. Hand joints  reveal mild  flexion changes. Fingernail / toenail health good. Able to lie down & sit up w/o help. Negative SLR bilaterally Vascular: Carotid, radial artery, dorsalis pedis and  posterior tibial pulses are full and equal. No bruits present. Neurologic: Alert and oriented x3. Deep tendon reflexes symmetrical and normal.        Skin: Intact without suspicious lesions or rashes. Lymph: No cervical, axillary lymphadenopathy present. Psych: Mood and affect are normal. Normally interactive  Assessment & Plan:  See Current Assessment & Plan in Problem List under specific Diagnosis

## 2013-10-16 ENCOUNTER — Other Ambulatory Visit: Payer: Self-pay | Admitting: Internal Medicine

## 2013-10-16 DIAGNOSIS — E119 Type 2 diabetes mellitus without complications: Secondary | ICD-10-CM

## 2013-10-16 DIAGNOSIS — E785 Hyperlipidemia, unspecified: Secondary | ICD-10-CM

## 2013-11-04 ENCOUNTER — Other Ambulatory Visit: Payer: Self-pay | Admitting: *Deleted

## 2013-11-04 ENCOUNTER — Encounter: Payer: Self-pay | Admitting: Internal Medicine

## 2013-11-04 DIAGNOSIS — E119 Type 2 diabetes mellitus without complications: Secondary | ICD-10-CM

## 2013-11-04 MED ORDER — METFORMIN HCL ER (MOD) 500 MG PO TB24: ORAL_TABLET | ORAL | Status: DC

## 2014-01-09 ENCOUNTER — Other Ambulatory Visit: Payer: Self-pay | Admitting: Internal Medicine

## 2014-01-13 NOTE — Telephone Encounter (Signed)
Rx sent to the pharmacy by e-script.//AB/CMA 

## 2014-01-27 ENCOUNTER — Telehealth: Payer: Self-pay | Admitting: *Deleted

## 2014-01-27 NOTE — Telephone Encounter (Signed)
Error

## 2014-01-27 NOTE — Telephone Encounter (Signed)
Patient presents with questions about his Rx for test strips. Called the pharmacy who states that he needs the [facility] Signed. Ask to fax to [facility] at [place].

## 2014-01-28 NOTE — Telephone Encounter (Signed)
LMOM (2:53pm) asking the pt to RTC regarding note about test strips.//AB/CMA

## 2014-01-30 NOTE — Telephone Encounter (Signed)
Detailed Written Orders(DWO) form for Medicare filled out and faxed to the pharmacy([facility]).//AB/CMA

## 2014-02-23 ENCOUNTER — Ambulatory Visit (INDEPENDENT_AMBULATORY_CARE_PROVIDER_SITE_OTHER): Payer: Medicare Other | Admitting: Family Medicine

## 2014-02-23 ENCOUNTER — Encounter: Payer: Self-pay | Admitting: Family Medicine

## 2014-02-23 VITALS — BP 124/76 | HR 58 | Temp 97.9°F | Wt 209.0 lb

## 2014-02-23 DIAGNOSIS — E119 Type 2 diabetes mellitus without complications: Secondary | ICD-10-CM | POA: Diagnosis not present

## 2014-02-23 DIAGNOSIS — E785 Hyperlipidemia, unspecified: Secondary | ICD-10-CM | POA: Diagnosis not present

## 2014-02-23 DIAGNOSIS — N400 Enlarged prostate without lower urinary tract symptoms: Secondary | ICD-10-CM

## 2014-02-23 NOTE — Progress Notes (Signed)
Pre visit review using our clinic review tool, if applicable. No additional management support is needed unless otherwise documented below in the visit note. 

## 2014-02-23 NOTE — Progress Notes (Signed)
   Subjective:    Patient ID: Robert Warner, male    DOB: [birth year], 73 y.o.   MRN: [id number]  HPI Patient is seen transferring care.  His past medical history includes history of obesity, type 2 diabetes, hyperlipidemia, and BPH. Patient states he had elevated PSA 5 or 6 years ago with negative prostate biopsies. He is followed by urology. He has type 2 diabetes with most recent A1c 6.7%. He takes extended-release metformin 500 mg once daily. Blood sugars been stable by home readings. No symptoms of hyperglycemia  He was briefly treated with statin but apparently had side effects and decided that he does not wish to take any statins. No history of CAD or peripheral vascular disease. He smoked from age [age]. No excessive alcohol use. He walks for exercise.  Past Medical History  Diagnosis Date  . Hyperlipidemia   . Hypertension   . Diabetes mellitus    Past Surgical History  Procedure Laterality Date  . Appendectomy  1968  . Colonoscopy  2376,2831    Negative, Dr. Sharlett Iles   . Prostate biopsy  2006    Dr.Sigmund Tannebaum    reports that he quit smoking about 47 years ago. He does not have any smokeless tobacco history on file. He reports that he drinks about 1.2 ounces of alcohol per week. He reports that he does not use illicit drugs. family history includes Breast cancer in his paternal aunt; Coronary artery disease in his maternal grandmother, maternal uncle, mother, and paternal aunt; Diabetes in his maternal aunt and maternal uncle; Hypertension in his mother; Pancreatic cancer in his brother; Stroke in his paternal grandmother; Stroke (age of onset: 68) in his father. There is no history of Heart disease. No Known Allergies    Review of Systems  Constitutional: Negative for fatigue and unexpected weight change.  Eyes: Negative for visual disturbance.  Respiratory: Negative for cough, chest tightness and shortness of breath.   Cardiovascular: Negative for chest pain,  palpitations and leg swelling.  Endocrine: Negative for polydipsia and polyuria.  Neurological: Negative for dizziness, syncope, weakness, light-headedness and headaches.       Objective:   Physical Exam  Constitutional: He is oriented to person, place, and time. He appears well-developed and well-nourished.  HENT:  Right Ear: External ear normal.  Left Ear: External ear normal.  Mouth/Throat: Oropharynx is clear and moist.  Eyes: Pupils are equal, round, and reactive to light.  Neck: Neck supple. No thyromegaly present.  Cardiovascular: Normal rate and regular rhythm.   Pulmonary/Chest: Effort normal and breath sounds normal. No respiratory distress. He has no wheezes. He has no rales.  Musculoskeletal: He exhibits no edema.  Neurological: He is alert and oriented to person, place, and time.  Skin:  Feet reveal no skin lesions. Good distal foot pulses. Good capillary refill. No calluses. Normal sensation with monofilament testing           Assessment & Plan:  #1 type 2 diabetes. History of excellent control in reviewing old readings. Schedule future labs with A1c. Continue yearly eye exam. Discussed weight control issues. Continue metformin #2 dyslipidemia. Patient refuses further statins. Check fasting lipid with followup labs in 2 months #3 history of BPH symptomatically stable

## 2014-03-09 ENCOUNTER — Telehealth: Payer: Self-pay

## 2014-03-09 NOTE — Telephone Encounter (Signed)
Relevant patient education assigned to patient using Emmi. ° °

## 2014-04-22 ENCOUNTER — Other Ambulatory Visit (INDEPENDENT_AMBULATORY_CARE_PROVIDER_SITE_OTHER): Payer: Medicare Other

## 2014-04-22 DIAGNOSIS — E785 Hyperlipidemia, unspecified: Secondary | ICD-10-CM | POA: Diagnosis not present

## 2014-04-22 DIAGNOSIS — E119 Type 2 diabetes mellitus without complications: Secondary | ICD-10-CM | POA: Diagnosis not present

## 2014-04-22 LAB — LIPID PANEL
CHOLESTEROL: 196 mg/dL (ref 0–200)
HDL: 61 mg/dL (ref >39.00)
LDL Cholesterol: 124 mg/dL — ABNORMAL HIGH (ref 0–99)
NONHDL: 135
Total CHOL/HDL Ratio: 3
Triglycerides: 55 mg/dL (ref 0.0–149.0)
VLDL: 11 mg/dL (ref 0.0–40.0)

## 2014-04-22 LAB — HEMOGLOBIN A1C: HEMOGLOBIN A1C: 6.4 % (ref 4.6–6.5)

## 2014-04-29 ENCOUNTER — Ambulatory Visit (INDEPENDENT_AMBULATORY_CARE_PROVIDER_SITE_OTHER): Payer: Medicare Other | Admitting: Family Medicine

## 2014-04-29 ENCOUNTER — Encounter: Payer: Self-pay | Admitting: Family Medicine

## 2014-04-29 VITALS — BP 130/80 | HR 60 | Temp 97.9°F | Wt 208.0 lb

## 2014-04-29 DIAGNOSIS — E119 Type 2 diabetes mellitus without complications: Secondary | ICD-10-CM

## 2014-04-29 DIAGNOSIS — E785 Hyperlipidemia, unspecified: Secondary | ICD-10-CM | POA: Diagnosis not present

## 2014-04-29 DIAGNOSIS — N400 Enlarged prostate without lower urinary tract symptoms: Secondary | ICD-10-CM | POA: Diagnosis not present

## 2014-04-29 LAB — HM DIABETES FOOT EXAM: HM Diabetic Foot Exam: NORMAL

## 2014-04-29 MED ORDER — METFORMIN HCL ER (MOD) 500 MG PO TB24: ORAL_TABLET | ORAL | Status: DC

## 2014-04-29 MED ORDER — PRAVASTATIN SODIUM 20 MG PO TABS: 20.0000 mg | ORAL_TABLET | Freq: Every day | ORAL | Status: DC

## 2014-04-29 NOTE — Progress Notes (Signed)
Pre visit review using our clinic review tool, if applicable. No additional management support is needed unless otherwise documented below in the visit note. 

## 2014-04-29 NOTE — Progress Notes (Signed)
   Subjective:    Patient ID: Robert Warner, male    DOB: 11-27-1940, 73 y.o.   MRN: [id number]  HPI Patient here for medical followup. He has history of type 2 diabetes, and dyslipidemia, BPH. His blood pressure has been well controlled. Urine microalbumin was negative last December. He gets eye exams every August. Blood sugars been very stable. Recent A1c 6.4%. Takes metformin extended release. No symptoms of hyperglycemia. No hypoglycemia. BPH symptoms stable. Recent cholesterol LDL 124. Previous lipid pravastatin without side effect. He is willing to go back on this. Takes aspirin daily. No chest pains.  Past Medical History  Diagnosis Date  . Hyperlipidemia   . Hypertension   . Diabetes mellitus    Past Surgical History  Procedure Laterality Date  . Appendectomy  1968  . Colonoscopy  8676,1950    Negative, Dr. Sharlett Iles   . Prostate biopsy  2006    Dr.Sigmund Tannebaum    reports that he quit smoking about 47 years ago. He does not have any smokeless tobacco history on file. He reports that he drinks about 1.2 ounces of alcohol per week. He reports that he does not use illicit drugs. family history includes Breast cancer in his paternal aunt; Coronary artery disease in his maternal grandmother, maternal uncle, mother, and paternal aunt; Diabetes in his maternal aunt and maternal uncle; Hypertension in his mother; Pancreatic cancer in his brother; Stroke in his paternal grandmother; Stroke (age of onset: 58) in his father. There is no history of Heart disease. No Known Allergies    Review of Systems  Constitutional: Negative for fatigue and unexpected weight change.  Eyes: Negative for visual disturbance.  Respiratory: Negative for cough, chest tightness and shortness of breath.   Cardiovascular: Negative for chest pain, palpitations and leg swelling.  Endocrine: Negative for polydipsia and polyuria.  Neurological: Negative for dizziness, syncope, weakness, light-headedness and  headaches.       Objective:   Physical Exam  Constitutional: He is oriented to person, place, and time. He appears well-developed and well-nourished.  HENT:  Right Ear: External ear normal.  Left Ear: External ear normal.  Mouth/Throat: Oropharynx is clear and moist.  Eyes: Pupils are equal, round, and reactive to light.  Neck: Neck supple. No thyromegaly present.  Cardiovascular: Normal rate and regular rhythm.   Pulmonary/Chest: Effort normal and breath sounds normal. No respiratory distress. He has no wheezes. He has no rales.  Musculoskeletal: He exhibits no edema.  Neurological: He is alert and oriented to person, place, and time.          Assessment & Plan:  #1 type 2 diabetes. Well controlled. Continue current medication. Continue exercise and weight control efforts. Recheck A1c in 6 months and repeat urine microalbumin then. Continue yearly eye exam. #2 hyperlipidemia. LDL not at goal. Start back pravastatin 20 mg once daily. Recheck lipids at followup #3 BPH. Symptomatically stable

## 2014-04-29 NOTE — Patient Instructions (Signed)
Get back on Pravastatin once daily

## 2014-05-04 DIAGNOSIS — N401 Enlarged prostate with lower urinary tract symptoms: Secondary | ICD-10-CM | POA: Diagnosis not present

## 2014-05-04 DIAGNOSIS — N139 Obstructive and reflux uropathy, unspecified: Secondary | ICD-10-CM | POA: Diagnosis not present

## 2014-05-04 DIAGNOSIS — N138 Other obstructive and reflux uropathy: Secondary | ICD-10-CM | POA: Diagnosis not present

## 2014-06-30 LAB — HM DIABETES EYE EXAM

## 2014-07-31 ENCOUNTER — Ambulatory Visit: Payer: Medicare Other

## 2014-07-31 ENCOUNTER — Ambulatory Visit (INDEPENDENT_AMBULATORY_CARE_PROVIDER_SITE_OTHER): Payer: Medicare Other

## 2014-07-31 DIAGNOSIS — Z23 Encounter for immunization: Secondary | ICD-10-CM

## 2014-07-31 DIAGNOSIS — Z2911 Encounter for prophylactic immunotherapy for respiratory syncytial virus (RSV): Secondary | ICD-10-CM

## 2014-08-03 ENCOUNTER — Ambulatory Visit: Payer: Medicare Other | Admitting: Family Medicine

## 2014-10-22 DIAGNOSIS — R351 Nocturia: Secondary | ICD-10-CM | POA: Diagnosis not present

## 2014-10-22 DIAGNOSIS — R3915 Urgency of urination: Secondary | ICD-10-CM | POA: Diagnosis not present

## 2014-10-22 DIAGNOSIS — N390 Urinary tract infection, site not specified: Secondary | ICD-10-CM | POA: Diagnosis not present

## 2014-10-22 DIAGNOSIS — R350 Frequency of micturition: Secondary | ICD-10-CM | POA: Diagnosis not present

## 2014-10-28 ENCOUNTER — Ambulatory Visit (INDEPENDENT_AMBULATORY_CARE_PROVIDER_SITE_OTHER): Payer: Medicare Other | Admitting: Family Medicine

## 2014-10-28 ENCOUNTER — Encounter: Payer: Self-pay | Admitting: Family Medicine

## 2014-10-28 VITALS — BP 130/80 | HR 66 | Temp 97.5°F | Wt 207.0 lb

## 2014-10-28 DIAGNOSIS — E119 Type 2 diabetes mellitus without complications: Secondary | ICD-10-CM | POA: Diagnosis not present

## 2014-10-28 DIAGNOSIS — E785 Hyperlipidemia, unspecified: Secondary | ICD-10-CM

## 2014-10-28 DIAGNOSIS — N400 Enlarged prostate without lower urinary tract symptoms: Secondary | ICD-10-CM

## 2014-10-28 LAB — HEMOGLOBIN A1C: Hgb A1c MFr Bld: 7.4 % — ABNORMAL HIGH (ref 4.6–6.5)

## 2014-10-28 LAB — HEPATIC FUNCTION PANEL
ALK PHOS: 63 U/L (ref 39–117)
ALT: 40 U/L (ref 0–53)
AST: 36 U/L (ref 0–37)
Albumin: 3.4 g/dL — ABNORMAL LOW (ref 3.5–5.2)
BILIRUBIN TOTAL: 0.8 mg/dL (ref 0.2–1.2)
Bilirubin, Direct: 0.1 mg/dL (ref 0.0–0.3)
Total Protein: 7 g/dL (ref 6.0–8.3)

## 2014-10-28 LAB — BASIC METABOLIC PANEL
BUN: 17 mg/dL (ref 6–23)
CALCIUM: 9.1 mg/dL (ref 8.4–10.5)
CO2: 27 mEq/L (ref 19–32)
Chloride: 103 mEq/L (ref 96–112)
Creatinine, Ser: 1.1 mg/dL (ref 0.4–1.5)
GFR: 82.09 mL/min (ref >60.00)
GLUCOSE: 163 mg/dL — AB (ref 70–99)
POTASSIUM: 4.8 meq/L (ref 3.5–5.1)
Sodium: 135 mEq/L (ref 135–145)

## 2014-10-28 LAB — LIPID PANEL
CHOLESTEROL: 121 mg/dL (ref 0–200)
HDL: 33.3 mg/dL — ABNORMAL LOW (ref >39.00)
LDL Cholesterol: 78 mg/dL (ref 0–99)
NONHDL: 87.7
Total CHOL/HDL Ratio: 4
Triglycerides: 49 mg/dL (ref 0.0–149.0)
VLDL: 9.8 mg/dL (ref 0.0–40.0)

## 2014-10-28 MED ORDER — PRAVASTATIN SODIUM 20 MG PO TABS: 20.0000 mg | ORAL_TABLET | Freq: Every day | ORAL | Status: DC

## 2014-10-28 NOTE — Progress Notes (Signed)
Pre visit review using our clinic review tool, if applicable. No additional management support is needed unless otherwise documented below in the visit note. 

## 2014-10-28 NOTE — Progress Notes (Signed)
   Subjective:    Patient ID: Robert Warner, male    DOB: Mar 26, 1941, 73 y.o.   MRN: [id number]  HPI Patient is seen for medical follow-up. He has history of BPH, type 2 diabetes, dyslipidemia. Last visit lipids were elevated and we started back pravastatin. He is compliant with medications. No side effects. No recent chest pains. Blood sugars reviewed. Very well controlled. Remains on metformin. A1c is consistently well controlled. He just had exam recently unremarkable. No history of neuropathy.  Past Medical History  Diagnosis Date  . Hyperlipidemia   . Hypertension   . Diabetes mellitus    Past Surgical History  Procedure Laterality Date  . Appendectomy  1968  . Colonoscopy  [id number]    Negative, Dr. Sharlett Iles   . Prostate biopsy  2006    Dr.Sigmund Tannebaum    reports that he quit smoking about 47 years ago. He does not have any smokeless tobacco history on file. He reports that he drinks about 1.2 oz of alcohol per week. He reports that he does not use illicit drugs. family history includes Breast cancer in his paternal aunt; Coronary artery disease in his maternal grandmother, maternal uncle, mother, and paternal aunt; Diabetes in his maternal aunt and maternal uncle; Hypertension in his mother; Pancreatic cancer in his brother; Stroke in his paternal grandmother; Stroke (age of onset: 59) in his father. There is no history of Heart disease. No Known Allergies    Review of Systems  Constitutional: Negative for fatigue and unexpected weight change.  Eyes: Negative for visual disturbance.  Respiratory: Negative for cough, chest tightness and shortness of breath.   Cardiovascular: Negative for chest pain, palpitations and leg swelling.  Endocrine: Negative for polydipsia and polyuria.  Neurological: Negative for dizziness, syncope, weakness, light-headedness and headaches.       Objective:   Physical Exam  Constitutional: He is oriented to person, place, and time. He  appears well-developed and well-nourished.  HENT:  Right Ear: External ear normal.  Left Ear: External ear normal.  Mouth/Throat: Oropharynx is clear and moist.  Eyes: Pupils are equal, round, and reactive to light.  Neck: Neck supple. No thyromegaly present.  Cardiovascular: Normal rate and regular rhythm.   Pulmonary/Chest: Effort normal and breath sounds normal. No respiratory distress. He has no wheezes. He has no rales.  Musculoskeletal: He exhibits no edema.  Neurological: He is alert and oriented to person, place, and time.  Psychiatric: He has a normal mood and affect. His behavior is normal.          Assessment & Plan:  #1 type 2 diabetes. History of excellent control. Well controlled by home readings. Recheck A1c. Check urine microalbumin. #2 dyslipidemia. Repeat lipid panel and hepatic panel. Continue pravastatin  # 3 BPH symptomatically stable

## 2014-10-29 ENCOUNTER — Telehealth: Payer: Self-pay | Admitting: Family Medicine

## 2014-10-29 ENCOUNTER — Other Ambulatory Visit: Payer: Self-pay | Admitting: Family Medicine

## 2014-10-29 DIAGNOSIS — E119 Type 2 diabetes mellitus without complications: Secondary | ICD-10-CM

## 2014-10-29 NOTE — Telephone Encounter (Signed)
emmi emailed °

## 2014-11-09 DIAGNOSIS — N390 Urinary tract infection, site not specified: Secondary | ICD-10-CM | POA: Diagnosis not present

## 2014-11-09 DIAGNOSIS — R350 Frequency of micturition: Secondary | ICD-10-CM | POA: Diagnosis not present

## 2014-11-09 DIAGNOSIS — N401 Enlarged prostate with lower urinary tract symptoms: Secondary | ICD-10-CM | POA: Diagnosis not present

## 2014-11-19 ENCOUNTER — Encounter: Payer: Self-pay | Admitting: Gastroenterology

## 2014-11-20 DIAGNOSIS — H2513 Age-related nuclear cataract, bilateral: Secondary | ICD-10-CM | POA: Diagnosis not present

## 2014-11-20 DIAGNOSIS — E119 Type 2 diabetes mellitus without complications: Secondary | ICD-10-CM | POA: Diagnosis not present

## 2014-11-20 DIAGNOSIS — H16223 Keratoconjunctivitis sicca, not specified as Sjogren's, bilateral: Secondary | ICD-10-CM | POA: Diagnosis not present

## 2014-11-20 DIAGNOSIS — H40013 Open angle with borderline findings, low risk, bilateral: Secondary | ICD-10-CM | POA: Diagnosis not present

## 2014-11-20 LAB — HM DIABETES EYE EXAM

## 2014-12-21 DIAGNOSIS — H40013 Open angle with borderline findings, low risk, bilateral: Secondary | ICD-10-CM | POA: Diagnosis not present

## 2014-12-21 DIAGNOSIS — H2513 Age-related nuclear cataract, bilateral: Secondary | ICD-10-CM | POA: Diagnosis not present

## 2015-04-26 ENCOUNTER — Ambulatory Visit: Payer: Medicare Other | Admitting: Family Medicine

## 2015-04-30 ENCOUNTER — Other Ambulatory Visit: Payer: Self-pay | Admitting: Family Medicine

## 2015-05-03 ENCOUNTER — Encounter: Payer: Self-pay | Admitting: Family Medicine

## 2015-05-03 ENCOUNTER — Ambulatory Visit (INDEPENDENT_AMBULATORY_CARE_PROVIDER_SITE_OTHER): Payer: Medicare Other | Admitting: Family Medicine

## 2015-05-03 VITALS — BP 130/78 | HR 60 | Temp 97.5°F | Wt 207.0 lb

## 2015-05-03 DIAGNOSIS — M545 Low back pain, unspecified: Secondary | ICD-10-CM

## 2015-05-03 DIAGNOSIS — E119 Type 2 diabetes mellitus without complications: Secondary | ICD-10-CM | POA: Diagnosis not present

## 2015-05-03 LAB — HEMOGLOBIN A1C: HEMOGLOBIN A1C: 6.6 % — AB (ref 4.6–6.5)

## 2015-05-03 NOTE — Patient Instructions (Signed)

## 2015-05-03 NOTE — Progress Notes (Signed)
Pre visit review using our clinic review tool, if applicable. No additional management support is needed unless otherwise documented below in the visit note. 

## 2015-05-03 NOTE — Progress Notes (Signed)
   Subjective:    Patient ID: Robert Warner, male    DOB: 1941/02/03, 74 y.o.   MRN: [id number]  HPI Patient for the following issues  New issue of right lower lumbar back pain. Initially complained of hip pain but pain is axial right lumbar area. Started several days ago. In retrospect is doing some sit ups with the grandson thinks that may be on a started. Not aware of specific injury. Location is right lumbar. No radiculopathy. No lower extremity numbness or weakness. No loss of bladder or bowel control. No appetite or weight changes. No fevers or chills. Has taken Aleve with some relief. Pain has improved past couple days.  Type 2 diabetes. Last A1c 7.4%. Fasting blood sugars are reviewed in a been stable recently. He currently takes extended release metformin and no other medications. No symptoms of polyuria or polydipsia  Past Medical History  Diagnosis Date  . Hyperlipidemia   . Hypertension   . Diabetes mellitus    Past Surgical History  Procedure Laterality Date  . Appendectomy  1968  . Colonoscopy  [id number]    Negative, Dr. Sharlett Warner   . Prostate biopsy  2006    Dr.Sigmund Warner    reports that he quit smoking about 48 years ago. He does not have any smokeless tobacco history on file. He reports that he drinks about 1.2 oz of alcohol per week. He reports that he does not use illicit drugs. family history includes Breast cancer in his paternal aunt; Coronary artery disease in his maternal grandmother, maternal uncle, mother, and paternal aunt; Diabetes in his maternal aunt and maternal uncle; Hypertension in his mother; Pancreatic cancer in his brother; Stroke in his paternal grandmother; Stroke (age of onset: 65) in his father. There is no history of Heart disease. No Known Allergies    Review of Systems  Constitutional: Negative for fatigue and unexpected weight change.  Eyes: Negative for visual disturbance.  Respiratory: Negative for cough, chest tightness and  shortness of breath.   Cardiovascular: Negative for chest pain, palpitations and leg swelling.  Genitourinary: Negative for dysuria.  Musculoskeletal: Positive for back pain.  Neurological: Negative for dizziness, syncope, weakness, light-headedness, numbness and headaches.       Objective:   Physical Exam  Constitutional: He appears well-developed and well-nourished. No distress.  Neck: Neck supple. No thyromegaly present.  Cardiovascular: Normal rate and regular rhythm.   Pulmonary/Chest: Effort normal and breath sounds normal. No respiratory distress. He has no wheezes. He has no rales.  Musculoskeletal: He exhibits no edema.  Straight leg raise are negative. No lower extremity edema Lumbar spine and lumbar area nontender to palpation  Lymphadenopathy:    He has no cervical adenopathy.  Neurological:  Full-strength lower extremities. Symmetric reflexes.          Assessment & Plan:  #1 Acute right lumbar back pain. Suspect strain. Improving. Nonfocal exam. Reassurance. Stretches given. Consider physical therapy if recurs #2 type 2 diabetes. History of marginal control. Recheck A1c

## 2015-05-10 ENCOUNTER — Other Ambulatory Visit: Payer: Self-pay

## 2015-05-24 DIAGNOSIS — N138 Other obstructive and reflux uropathy: Secondary | ICD-10-CM | POA: Diagnosis not present

## 2015-05-24 DIAGNOSIS — N401 Enlarged prostate with lower urinary tract symptoms: Secondary | ICD-10-CM | POA: Diagnosis not present

## 2015-05-24 DIAGNOSIS — R351 Nocturia: Secondary | ICD-10-CM | POA: Diagnosis not present

## 2015-06-07 ENCOUNTER — Encounter: Payer: Self-pay | Admitting: Family Medicine

## 2015-06-07 ENCOUNTER — Ambulatory Visit (INDEPENDENT_AMBULATORY_CARE_PROVIDER_SITE_OTHER): Payer: Medicare Other | Admitting: Family Medicine

## 2015-06-07 VITALS — BP 122/86 | HR 71 | Temp 98.4°F | Resp 20 | Ht 70.5 in | Wt 207.0 lb

## 2015-06-07 DIAGNOSIS — E119 Type 2 diabetes mellitus without complications: Secondary | ICD-10-CM

## 2015-06-07 DIAGNOSIS — Z0000 Encounter for general adult medical examination without abnormal findings: Secondary | ICD-10-CM

## 2015-06-07 DIAGNOSIS — Z23 Encounter for immunization: Secondary | ICD-10-CM | POA: Diagnosis not present

## 2015-06-07 LAB — HM DIABETES FOOT EXAM: HM DIABETIC FOOT EXAM: NORMAL

## 2015-06-07 NOTE — Progress Notes (Signed)
Pre visit review using our clinic review tool, if applicable. No additional management support is needed unless otherwise documented below in the visit note. 

## 2015-06-07 NOTE — Patient Instructions (Signed)
Continue with yearly flu vaccine We gave you Prevnar 13 today You will need tetanus in 3 years.

## 2015-06-07 NOTE — Progress Notes (Signed)
Subjective:    Patient ID: Robert Warner, male    DOB: [birth year], 74 y.o.   MRN: [id number]  HPI Patient seen for Medicare wellness exam and medical follow-up. Immunizations are up-to-date with the exception of no history of Prevnar 13. He's been followed by urology for elevated PSA with previous negative biopsies and had exam just 2 weeks ago and PSA reportedly stable. Colonoscopy up-to-date  Type 2 diabetes. Recent A1c 6.6% and improved. He stays active with golf. He also has hyperlipidemia improved on pravastatin. No history of CAD or peripheral vascular disease. BPH symptoms are stable  1.  Risk factors based on Past Medical , Social, and Family history reviewed and as indicated above with no changes 2.  Limitations in physical activities None.  No recent falls. 3.  Depression/mood No active depression or anxiety issues 4.  Hearing No defiits 5.  ADLs independent in all. 6.  Cognitive function (orientation to time and place, language, writing, speech,memory) no short or long term memory issues.  Language and judgement intact. 7.  Home Safety no issues 8.  Height, weight, and visual acuity.all stable. 9.  Counseling discussed importance of continue regular walking and exercise 10. Recommendation of preventive services. Prevnar 13. Continue yearly flu vaccine. 11. Labs based on risk factors none today. 12. Care Plan as above 13. Other Providers Dr Janice [facility]. 14. Written schedule of screening/prevention services given to patient.   Past Medical History  Diagnosis Date  . Hyperlipidemia   . Hypertension   . Diabetes mellitus    Past Surgical History  Procedure Laterality Date  . Appendectomy  1968  . Colonoscopy  [id number]    Negative, Dr. Sharlett Iles   . Prostate biopsy  2006    Dr.Sigmund Tannebaum    reports that he quit smoking about 48 years ago. He does not have any smokeless tobacco history on file. He reports that he drinks about 1.2 oz of alcohol per week. He  reports that he does not use illicit drugs. family history includes Breast cancer in his paternal aunt; Coronary artery disease in his maternal grandmother, maternal uncle, mother, and paternal aunt; Diabetes in his maternal aunt and maternal uncle; Hypertension in his mother; Pancreatic cancer in his brother; Stroke in his paternal grandmother; Stroke (age of onset: 30) in his father. There is no history of Heart disease. No Known Allergies    Review of Systems  Constitutional: Negative for fever, activity change, appetite change and fatigue.  HENT: Negative for congestion, ear pain and trouble swallowing.   Eyes: Negative for pain and visual disturbance.  Respiratory: Negative for cough, shortness of breath and wheezing.   Cardiovascular: Negative for chest pain and palpitations.  Gastrointestinal: Negative for nausea, vomiting, abdominal pain, diarrhea, constipation, blood in stool, abdominal distention and rectal pain.  Genitourinary: Negative for dysuria, hematuria and testicular pain.  Musculoskeletal: Negative for joint swelling and arthralgias.  Skin: Negative for rash.  Neurological: Negative for dizziness, syncope and headaches.  Hematological: Negative for adenopathy.  Psychiatric/Behavioral: Negative for confusion and dysphoric mood.       Objective:   Physical Exam  Constitutional: He is oriented to person, place, and time. He appears well-developed and well-nourished. No distress.  HENT:  Head: Normocephalic and atraumatic.  Right Ear: External ear normal.  Left Ear: External ear normal.  Mouth/Throat: Oropharynx is clear and moist.  Eyes: Conjunctivae and EOM are normal. Pupils are equal, round, and reactive to light.  Neck: Normal range of motion. Neck  supple. No thyromegaly present.  Cardiovascular: Normal rate, regular rhythm and normal heart sounds.   No murmur heard. Pulmonary/Chest: No respiratory distress. He has no wheezes. He has no rales.  Abdominal: Soft.  Bowel sounds are normal. He exhibits no distension and no mass. There is no tenderness. There is no rebound and no guarding.  Genitourinary:  Per urology  Musculoskeletal: He exhibits no edema.  Lymphadenopathy:    He has no cervical adenopathy.  Neurological: He is alert and oriented to person, place, and time. He displays normal reflexes. No cranial nerve deficit.  Skin: No rash noted.  Psychiatric: He has a normal mood and affect.          Assessment & Plan:  Medicare wellness exam. Health maintenance issues addressed. Prevnar 13 given. Continue yearly flu vaccine. Tetanus booster and repeat colonoscopy in 3 years  Type 2 diabetes which is improved with recent A1c 6.6%. We'll plan repeat A1c in 5 months. Continue yearly eye exam and he will be due for repeat in August.

## 2015-06-28 DIAGNOSIS — H2513 Age-related nuclear cataract, bilateral: Secondary | ICD-10-CM | POA: Diagnosis not present

## 2015-06-28 DIAGNOSIS — H40013 Open angle with borderline findings, low risk, bilateral: Secondary | ICD-10-CM | POA: Diagnosis not present

## 2015-07-13 ENCOUNTER — Other Ambulatory Visit: Payer: Self-pay | Admitting: Family Medicine

## 2015-08-06 DIAGNOSIS — H4011X1 Primary open-angle glaucoma, mild stage: Secondary | ICD-10-CM | POA: Diagnosis not present

## 2015-08-06 DIAGNOSIS — H2513 Age-related nuclear cataract, bilateral: Secondary | ICD-10-CM | POA: Diagnosis not present

## 2015-10-25 ENCOUNTER — Other Ambulatory Visit: Payer: Self-pay | Admitting: Family Medicine

## 2015-11-22 DIAGNOSIS — H2513 Age-related nuclear cataract, bilateral: Secondary | ICD-10-CM | POA: Diagnosis not present

## 2015-11-22 DIAGNOSIS — H40023 Open angle with borderline findings, high risk, bilateral: Secondary | ICD-10-CM | POA: Diagnosis not present

## 2015-11-22 DIAGNOSIS — H16223 Keratoconjunctivitis sicca, not specified as Sjogren's, bilateral: Secondary | ICD-10-CM | POA: Diagnosis not present

## 2016-04-22 ENCOUNTER — Other Ambulatory Visit: Payer: Self-pay | Admitting: Family Medicine

## 2016-05-24 DIAGNOSIS — N401 Enlarged prostate with lower urinary tract symptoms: Secondary | ICD-10-CM | POA: Diagnosis not present

## 2016-05-24 DIAGNOSIS — R351 Nocturia: Secondary | ICD-10-CM | POA: Diagnosis not present

## 2016-06-14 ENCOUNTER — Encounter: Payer: Self-pay | Admitting: Family Medicine

## 2016-06-14 ENCOUNTER — Ambulatory Visit (INDEPENDENT_AMBULATORY_CARE_PROVIDER_SITE_OTHER): Payer: Medicare Other | Admitting: Family Medicine

## 2016-06-14 VITALS — BP 142/88 | HR 76 | Temp 97.9°F | Ht 70.5 in | Wt 206.5 lb

## 2016-06-14 DIAGNOSIS — E119 Type 2 diabetes mellitus without complications: Secondary | ICD-10-CM

## 2016-06-14 DIAGNOSIS — R030 Elevated blood-pressure reading, without diagnosis of hypertension: Secondary | ICD-10-CM | POA: Diagnosis not present

## 2016-06-14 DIAGNOSIS — E785 Hyperlipidemia, unspecified: Secondary | ICD-10-CM | POA: Diagnosis not present

## 2016-06-14 DIAGNOSIS — N400 Enlarged prostate without lower urinary tract symptoms: Secondary | ICD-10-CM

## 2016-06-14 DIAGNOSIS — Z0000 Encounter for general adult medical examination without abnormal findings: Secondary | ICD-10-CM | POA: Diagnosis not present

## 2016-06-14 DIAGNOSIS — IMO0001 Reserved for inherently not codable concepts without codable children: Secondary | ICD-10-CM

## 2016-06-14 LAB — BASIC METABOLIC PANEL
BUN: 14 mg/dL (ref 6–23)
CO2: 26 mEq/L (ref 19–32)
CREATININE: 1.07 mg/dL (ref 0.40–1.50)
Calcium: 9.5 mg/dL (ref 8.4–10.5)
Chloride: 104 mEq/L (ref 96–112)
GFR: 86.16 mL/min (ref >60.00)
GLUCOSE: 121 mg/dL — AB (ref 70–99)
POTASSIUM: 4.7 meq/L (ref 3.5–5.1)
SODIUM: 140 meq/L (ref 135–145)

## 2016-06-14 LAB — HEPATIC FUNCTION PANEL
ALK PHOS: 60 U/L (ref 39–117)
ALT: 16 U/L (ref 0–53)
AST: 25 U/L (ref 0–37)
Albumin: 4 g/dL (ref 3.5–5.2)
BILIRUBIN TOTAL: 1 mg/dL (ref 0.2–1.2)
Bilirubin, Direct: 0.2 mg/dL (ref 0.0–0.3)
Total Protein: 7 g/dL (ref 6.0–8.3)

## 2016-06-14 LAB — LIPID PANEL
CHOL/HDL RATIO: 4
Cholesterol: 193 mg/dL (ref 0–200)
HDL: 53.9 mg/dL (ref >39.00)
LDL Cholesterol: 128 mg/dL — ABNORMAL HIGH (ref 0–99)
NonHDL: 139.55
Triglycerides: 60 mg/dL (ref 0.0–149.0)
VLDL: 12 mg/dL (ref 0.0–40.0)

## 2016-06-14 LAB — MICROALBUMIN / CREATININE URINE RATIO
CREATININE, U: 183.7 mg/dL
MICROALB/CREAT RATIO: 0.4 mg/g (ref 0.0–30.0)

## 2016-06-14 LAB — HEMOGLOBIN A1C: HEMOGLOBIN A1C: 6.8 % — AB (ref 4.6–6.5)

## 2016-06-14 NOTE — Progress Notes (Signed)
Subjective:     Patient ID: Robert Warner, male   DOB: Mar 19, 1941, 75 y.o.   MRN: [id number]  HPI Patient is here for Medicare subsequent annual wellness visit and medical follow-up. He has history of BPH followed by urology. Type 2 diabetes without complication. Not monitoring blood sugars regular. No polyuria or polydipsia. A1c has been well controlled. Medications reviewed and compliant with no side effects.  Dyslipidemia. He was on statin previously and denies any side effects but decided to take himself off. He was just concerned about possible long-term side effects. Colonoscopy up-to-date. Immunizations up-to-date. Gets regular eye exams. Last eye exam was last October. No neuropathy symptoms.  Usually plays golf several times per week.  Past Medical History:  Diagnosis Date  . Diabetes mellitus   . Hyperlipidemia   . Hypertension    Past Surgical History:  Procedure Laterality Date  . APPENDECTOMY  1968  . COLONOSCOPY  [id number]   Negative, Dr. Sharlett Iles   . PROSTATE BIOPSY  2006   Dr.Sigmund Tannebaum    reports that he quit smoking about 49 years ago. He has never used smokeless tobacco. He reports that he drinks about 1.2 oz of alcohol per week . He reports that he does not use drugs. family history includes Breast cancer in his paternal aunt; Coronary artery disease in his maternal grandmother, maternal uncle, mother, and paternal aunt; Diabetes in his maternal aunt and maternal uncle; Hypertension in his mother; Pancreatic cancer in his brother; Stroke in his paternal grandmother; Stroke (age of onset: 69) in his father. No Known Allergies  1.  Risk factors based on Past Medical , Social, and Family history reviewed and as indicated above with no changes 2.  Limitations in physical activities None.  No recent falls.Plays golf about 4 days per week. 3.  Depression/mood No active depression or anxiety issues 4.  Hearing Chronic hearing loss with bilateral hearing aids 5.   ADLs independent in all. 6.  Cognitive function (orientation to time and place, language, writing, speech,memory) no short or long term memory issues.  Language and judgement intact. 7.  Home Safety no issues 8.  Height, weight, and visual acuity.all stable. 9.  Counseling discussed continued regular exercise. 10. Recommendation of preventive services. Flu vaccine this fall 11. Labs based on risk factors hemoglobin A1c, lipid, hepatic, basic metabolic panel, urine microalbumin 12. Care Plan as above 13. Other Providers Dr. Darlyn Chamber, Dr. Shawnie Dapper 14. Written schedule of screening/prevention services given to patient.   Review of Systems  Constitutional: Negative for activity change, appetite change, fatigue and fever.  HENT: Negative for congestion, ear pain and trouble swallowing.   Eyes: Negative for pain and visual disturbance.  Respiratory: Negative for cough, shortness of breath and wheezing.   Cardiovascular: Negative for chest pain and palpitations.  Gastrointestinal: Negative for abdominal distention, abdominal pain, blood in stool, constipation, diarrhea, nausea, rectal pain and vomiting.  Genitourinary: Negative for dysuria, hematuria and testicular pain.  Musculoskeletal: Negative for arthralgias and joint swelling.  Skin: Negative for rash.  Neurological: Negative for dizziness, syncope and headaches.  Hematological: Negative for adenopathy.  Psychiatric/Behavioral: Negative for confusion and dysphoric mood.       Objective:   Physical Exam  Constitutional: He is oriented to person, place, and time. He appears well-developed and well-nourished. No distress.  HENT:  Head: Normocephalic and atraumatic.  Right Ear: External ear normal.  Left Ear: External ear normal.  Mouth/Throat: Oropharynx is clear and moist.  Eyes: Conjunctivae and EOM  are normal. Pupils are equal, round, and reactive to light.  Neck: Normal range of motion. Neck supple. No  thyromegaly present.  Cardiovascular: Normal rate, regular rhythm and normal heart sounds.   No murmur heard. Pulmonary/Chest: No respiratory distress. He has no wheezes. He has no rales.  Abdominal: Soft. Bowel sounds are normal. He exhibits no distension and no mass. There is no tenderness. There is no rebound and no guarding.  Musculoskeletal: He exhibits no edema.  Lymphadenopathy:    He has no cervical adenopathy.  Neurological: He is alert and oriented to person, place, and time. He displays normal reflexes. No cranial nerve deficit.  Skin: No rash noted.  Psychiatric: He has a normal mood and affect.       Assessment:     #1 Medicare subsequent annual wellness visit  #2 BPH stable on Rapaflo  #3 history of dyslipidemia  #4 borderline elevated blood pressure  #5 type 2 diabetes with history of good control    Plan:     -Continue yearly flu vaccine -Check labs including A1c, lipid, hepatic, basic met panel, urine microalbumin screen -Continue regular weightbearing exercise -continue with yearly eye exams.  [name] MD [facility]

## 2016-06-14 NOTE — Progress Notes (Signed)
Pre visit review using our clinic review tool, if applicable. No additional management support is needed unless otherwise documented below in the visit note. 

## 2016-06-14 NOTE — Patient Instructions (Signed)
Health Maintenance  Topic Date Due  . URINE MICROALBUMIN  10/15/2014  . HEMOGLOBIN A1C  11/02/2015  . FOOT EXAM  06/06/2016  . INFLUENZA VACCINE  06/13/2016  . OPHTHALMOLOGY EXAM  08/24/2016  . TETANUS/TDAP  06/15/2018  . ZOSTAVAX  Completed  . PNA vac Low Risk Adult  Completed   Consider checking BP at home. We would like to see BP < 140/90

## 2016-06-21 ENCOUNTER — Other Ambulatory Visit: Payer: Self-pay

## 2016-06-21 MED ORDER — PRAVASTATIN SODIUM 20 MG PO TABS: 20.0000 mg | ORAL_TABLET | Freq: Every day | ORAL | 3 refills | Status: DC

## 2016-07-14 ENCOUNTER — Other Ambulatory Visit: Payer: Self-pay

## 2016-07-14 ENCOUNTER — Ambulatory Visit (INDEPENDENT_AMBULATORY_CARE_PROVIDER_SITE_OTHER): Payer: Medicare Other

## 2016-07-14 DIAGNOSIS — Z23 Encounter for immunization: Secondary | ICD-10-CM

## 2016-07-21 ENCOUNTER — Other Ambulatory Visit: Payer: Self-pay | Admitting: Family Medicine

## 2016-07-23 NOTE — Telephone Encounter (Signed)
Refill for one year 

## 2016-07-24 NOTE — Telephone Encounter (Signed)
Rx refill sent to pharmacy. 

## 2016-10-03 ENCOUNTER — Other Ambulatory Visit: Payer: Self-pay

## 2016-10-03 ENCOUNTER — Encounter: Payer: Self-pay | Admitting: Family Medicine

## 2016-10-03 DIAGNOSIS — H2513 Age-related nuclear cataract, bilateral: Secondary | ICD-10-CM | POA: Diagnosis not present

## 2016-10-03 DIAGNOSIS — H40012 Open angle with borderline findings, low risk, left eye: Secondary | ICD-10-CM | POA: Diagnosis not present

## 2016-10-03 DIAGNOSIS — H40011 Open angle with borderline findings, low risk, right eye: Secondary | ICD-10-CM | POA: Diagnosis not present

## 2016-10-03 DIAGNOSIS — H524 Presbyopia: Secondary | ICD-10-CM | POA: Diagnosis not present

## 2016-10-03 MED ORDER — GLUCOSE BLOOD VI STRP: ORAL_STRIP | 3 refills | Status: DC

## 2016-10-09 LAB — HM DIABETES EYE EXAM

## 2016-10-12 ENCOUNTER — Encounter: Payer: Self-pay | Admitting: Family Medicine

## 2016-10-24 ENCOUNTER — Telehealth: Payer: Self-pay | Admitting: Family Medicine

## 2016-10-24 NOTE — Telephone Encounter (Signed)
Patient stopped by office stating that he needs a refill on his meds, Metformin especially. Patient states that mail order pharmacy, [facility] stated that prior authorization must be completed. [facility] # [phone] per pt. Rx # ending in [place], invoice [id number]. Please advise.

## 2016-10-24 NOTE — Telephone Encounter (Signed)
What we just discussed

## 2016-10-25 NOTE — Telephone Encounter (Signed)
PA submitted & is pending. Key:  [id number]

## 2016-10-26 NOTE — Telephone Encounter (Signed)
Received form from insurance company. They are stating the the Glucophage XR should be covered, patient is currently taking the [place].

## 2016-10-27 MED ORDER — METFORMIN HCL ER 500 MG PO TB24: ORAL_TABLET | ORAL | 2 refills | Status: DC

## 2016-10-27 NOTE — Telephone Encounter (Signed)
I have sent in alternate medication for patient. Do you need the PA form back?

## 2016-10-27 NOTE — Telephone Encounter (Signed)
Nope! It can just be thrown away. Thanks!

## 2016-10-27 NOTE — Telephone Encounter (Signed)
Okay to change this?

## 2016-10-27 NOTE — Telephone Encounter (Signed)
OK to change to Glucophage XR 500 mg po bid

## 2016-12-18 ENCOUNTER — Ambulatory Visit: Payer: Medicare Other | Admitting: Family Medicine

## 2017-01-01 ENCOUNTER — Ambulatory Visit (INDEPENDENT_AMBULATORY_CARE_PROVIDER_SITE_OTHER): Payer: Medicare Other | Admitting: Family Medicine

## 2017-01-01 ENCOUNTER — Encounter: Payer: Self-pay | Admitting: Family Medicine

## 2017-01-01 VITALS — BP 140/80 | HR 81 | Ht 70.5 in | Wt 209.0 lb

## 2017-01-01 DIAGNOSIS — E119 Type 2 diabetes mellitus without complications: Secondary | ICD-10-CM | POA: Diagnosis not present

## 2017-01-01 DIAGNOSIS — R030 Elevated blood-pressure reading, without diagnosis of hypertension: Secondary | ICD-10-CM | POA: Diagnosis not present

## 2017-01-01 LAB — POCT GLYCOSYLATED HEMOGLOBIN (HGB A1C)

## 2017-01-01 NOTE — Progress Notes (Signed)
Pre visit review using our clinic review tool, if applicable. No additional management support is needed unless otherwise documented below in the visit note. 

## 2017-01-01 NOTE — Patient Instructions (Signed)
Monitor blood pressure and be in touch if consistently > 140/90.   

## 2017-01-01 NOTE — Progress Notes (Signed)
Subjective:     Patient ID: Robert Warner, male   DOB: 03-08-1941, 76 y.o.   MRN: [id number]  HPI Patient seen for follow-up type 2 diabetes and elevated blood pressure without diagnosis of hypertension. He also has hyperlipidemia on pravastatin. Just returned from [place]. He played golf there several days. Denies any dietary indiscretion. Blood sugars been ranging slightly higher recent fastings run 140. He takes metformin extended release but no other medications for his diabetes. Denies any polyuria or polydipsia. Never been treated for hypertension. No chest pains. No dyspnea. No falls.  Last A1c 6.8%. He has had some stress in dealing with his wife having some health issues recently.  Past Medical History:  Diagnosis Date  . Diabetes mellitus   . Hyperlipidemia   . Hypertension    Past Surgical History:  Procedure Laterality Date  . APPENDECTOMY  1968  . COLONOSCOPY  [id number]   Negative, Dr. Sharlett Iles   . PROSTATE BIOPSY  2006   Dr.Sigmund Tannebaum    reports that he quit smoking about 50 years ago. He has never used smokeless tobacco. He reports that he drinks about 1.2 oz of alcohol per week . He reports that he does not use drugs. family history includes Breast cancer in his paternal aunt; Coronary artery disease in his maternal grandmother, maternal uncle, mother, and paternal aunt; Diabetes in his maternal aunt and maternal uncle; Hypertension in his mother; Pancreatic cancer in his brother; Stroke in his paternal grandmother; Stroke (age of onset: 72) in his father. No Known Allergies   Review of Systems  Constitutional: Negative for fatigue and unexpected weight change.  Eyes: Negative for visual disturbance.  Respiratory: Negative for cough, chest tightness and shortness of breath.   Cardiovascular: Negative for chest pain, palpitations and leg swelling.  Endocrine: Negative for polydipsia and polyuria.  Genitourinary: Negative for dysuria.  Neurological: Negative  for dizziness, syncope, weakness, light-headedness and headaches.       Objective:   Physical Exam  Constitutional: He is oriented to person, place, and time. He appears well-developed and well-nourished.  HENT:  Right Ear: External ear normal.  Left Ear: External ear normal.  Mouth/Throat: Oropharynx is clear and moist.  Eyes: Pupils are equal, round, and reactive to light.  Neck: Neck supple. No thyromegaly present.  Cardiovascular: Normal rate and regular rhythm.   Pulmonary/Chest: Effort normal and breath sounds normal. No respiratory distress. He has no wheezes. He has no rales.  Musculoskeletal: He exhibits no edema.  Neurological: He is alert and oriented to person, place, and time.       Assessment:     #1 type 2 diabetes. History of good control but recent increase in fasting blood sugars  #2 elevated blood pressure without diagnosis of hypertension. Initial blood pressure reading here today 150/100 and repeat left arm seated after rest 140/80  #3 dyslipidemia. Recent lipids from August reviewed    Plan:     -Recheck A1c=6.9%  -Discussed nonpharmacologic management of diabetes and hypertension -We recommended goal blood pressure less than 130/80  Robert Post MD [facility] at [facility]

## 2017-01-31 DIAGNOSIS — H40013 Open angle with borderline findings, low risk, bilateral: Secondary | ICD-10-CM | POA: Diagnosis not present

## 2017-05-29 ENCOUNTER — Other Ambulatory Visit: Payer: Self-pay | Admitting: Family Medicine

## 2017-05-31 ENCOUNTER — Telehealth: Payer: Self-pay | Admitting: Family Medicine

## 2017-05-31 NOTE — Telephone Encounter (Signed)
Pt is calling needing to change pharmacy to [facility] for his Metformin 500 MG.  Pt state that he has a few on hand and would like to see if this can be called in.

## 2017-06-01 ENCOUNTER — Telehealth: Payer: Self-pay

## 2017-06-01 MED ORDER — METFORMIN HCL ER 500 MG PO TB24: ORAL_TABLET | ORAL | 2 refills | Status: DC

## 2017-06-01 NOTE — Telephone Encounter (Signed)
Received PA request for Metformin. PA submitted via form from insurance company & faxed back.

## 2017-06-05 ENCOUNTER — Other Ambulatory Visit: Payer: Self-pay | Admitting: *Deleted

## 2017-06-05 MED ORDER — METFORMIN HCL ER 500 MG PO TB24: ORAL_TABLET | ORAL | 2 refills | Status: DC

## 2017-07-02 ENCOUNTER — Encounter: Payer: Self-pay | Admitting: Family Medicine

## 2017-07-02 ENCOUNTER — Ambulatory Visit (INDEPENDENT_AMBULATORY_CARE_PROVIDER_SITE_OTHER): Payer: Medicare Other | Admitting: Family Medicine

## 2017-07-02 DIAGNOSIS — E119 Type 2 diabetes mellitus without complications: Secondary | ICD-10-CM | POA: Diagnosis not present

## 2017-07-02 LAB — MICROALBUMIN / CREATININE URINE RATIO
CREATININE, U: 97.8 mg/dL
Microalb Creat Ratio: 0.7 mg/g (ref 0.0–30.0)

## 2017-07-02 LAB — HEPATIC FUNCTION PANEL
ALBUMIN: 3.8 g/dL (ref 3.5–5.2)
ALK PHOS: 57 U/L (ref 39–117)
ALT: 16 U/L (ref 0–53)
AST: 25 U/L (ref 0–37)
Bilirubin, Direct: 0.2 mg/dL (ref 0.0–0.3)
TOTAL PROTEIN: 7 g/dL (ref 6.0–8.3)
Total Bilirubin: 1 mg/dL (ref 0.2–1.2)

## 2017-07-02 LAB — LIPID PANEL
Cholesterol: 145 mg/dL (ref 0–200)
HDL: 52.1 mg/dL (ref >39.00)
LDL Cholesterol: 81 mg/dL (ref 0–99)
NonHDL: 92.4
TRIGLYCERIDES: 56 mg/dL (ref 0.0–149.0)
Total CHOL/HDL Ratio: 3
VLDL: 11.2 mg/dL (ref 0.0–40.0)

## 2017-07-02 LAB — BASIC METABOLIC PANEL
BUN: 13 mg/dL (ref 6–23)
CALCIUM: 9.2 mg/dL (ref 8.4–10.5)
CO2: 27 meq/L (ref 19–32)
CREATININE: 0.95 mg/dL (ref 0.40–1.50)
Chloride: 104 mEq/L (ref 96–112)
GFR: 98.57 mL/min (ref >60.00)
Glucose, Bld: 160 mg/dL — ABNORMAL HIGH (ref 70–99)
Potassium: 4.4 mEq/L (ref 3.5–5.1)
Sodium: 137 mEq/L (ref 135–145)

## 2017-07-02 LAB — POCT GLYCOSYLATED HEMOGLOBIN (HGB A1C): HEMOGLOBIN A1C: 7.6

## 2017-07-02 NOTE — Progress Notes (Signed)
Subjective:     Patient ID: Robert Warner, male   DOB: 12/24/1940, 76 y.o.   MRN: [id number]  HPI Patient's chronic problems including type 2 diabetes, dyslipidemia, BPH. He's had somewhat borderline elevated blood pressure readings previously but never treated with hypertensive medications. He remains on Rapaflo for BPH and symptomatically stable.  Slight weight gain since last visit. Poor compliance with diet times and no recent consistent exercise. Last A1c 6.9%. He sees podiatrist regularly and requesting referral. He takes generic metformin extended release 500 mg twice a day and apparently his genetic manufacturer change recently and he is convinced that has to do with his blood sugars elevating. Recent fasting blood sugars around 140 consistently. He is due for lipids. Takes pravastatin once daily for that. No myalgias. He is getting yearly eye exams.  Past Medical History:  Diagnosis Date  . Diabetes mellitus   . Hyperlipidemia   . Hypertension    Past Surgical History:  Procedure Laterality Date  . APPENDECTOMY  1968  . COLONOSCOPY  [id number]   Negative, Dr. Sharlett Iles   . PROSTATE BIOPSY  2006   Dr.Sigmund Tannebaum    reports that he quit smoking about 50 years ago. He has never used smokeless tobacco. He reports that he drinks about 1.2 oz of alcohol per week . He reports that he does not use drugs. family history includes Breast cancer in his paternal aunt; Coronary artery disease in his maternal grandmother, maternal uncle, mother, and paternal aunt; Diabetes in his maternal aunt and maternal uncle; Hypertension in his mother; Pancreatic cancer in his brother; Stroke in his paternal grandmother; Stroke (age of onset: 62) in his father. No Known Allergies   Review of Systems  Constitutional: Negative for activity change, appetite change, fatigue and fever.  HENT: Negative for congestion, ear pain and trouble swallowing.   Eyes: Negative for pain and visual disturbance.   Respiratory: Negative for cough, shortness of breath and wheezing.   Cardiovascular: Negative for chest pain and palpitations.  Gastrointestinal: Negative for abdominal distention, abdominal pain, blood in stool, constipation, diarrhea, nausea, rectal pain and vomiting.  Endocrine: Negative for polydipsia and polyuria.  Genitourinary: Negative for dysuria, hematuria and testicular pain.  Musculoskeletal: Negative for arthralgias and joint swelling.  Skin: Negative for rash.  Neurological: Negative for dizziness, syncope and headaches.  Hematological: Negative for adenopathy.  Psychiatric/Behavioral: Negative for confusion and dysphoric mood.       Objective:   Physical Exam  Constitutional: He is oriented to person, place, and time. He appears well-developed and well-nourished. No distress.  HENT:  Head: Normocephalic and atraumatic.  Right Ear: External ear normal.  Left Ear: External ear normal.  Mouth/Throat: Oropharynx is clear and moist.  Eyes: Pupils are equal, round, and reactive to light. Conjunctivae and EOM are normal.  Neck: Normal range of motion. Neck supple. No thyromegaly present.  Cardiovascular: Normal rate, regular rhythm and normal heart sounds.   No murmur heard. Pulmonary/Chest: No respiratory distress. He has no wheezes. He has no rales.  Abdominal: Soft. Bowel sounds are normal. He exhibits no distension and no mass. There is no tenderness. There is no rebound and no guarding.  Musculoskeletal: He exhibits no edema.  Lymphadenopathy:    He has no cervical adenopathy.  Neurological: He is alert and oriented to person, place, and time. He displays normal reflexes. No cranial nerve deficit.  Skin: No rash noted.  Feet reveal no skin lesions. Good distal foot pulses. Good capillary refill. No calluses.  Normal sensation with monofilament testing   Psychiatric: He has a normal mood and affect.       Assessment:     #1 type 2 diabetes with suboptimal control  A1c today 7.6%  #2 dyslipidemia  #3 borderline elevated blood pressure. He's had a couple visits with readings above our goal    Plan:     -Strongly encouraged to lose some weight and establish more consistent exercise -We discussed increasing metformin versus additional medication versus weight loss and increase compliance and he prefers the latter. We'll plan follow-up in 3 months and recheck A1c -Referral made to his podiatrist -Check further labs with lipid panel, basic metabolic panel, hepatic panel, and urine microalbumin screen  [name] MD [facility] at [facility]

## 2017-07-02 NOTE — Patient Instructions (Signed)
Try to drop a few pounds- this should help your BP and blood sugar.

## 2017-07-03 ENCOUNTER — Telehealth: Payer: Self-pay

## 2017-07-03 NOTE — Telephone Encounter (Signed)
Received PA request for Metformin MOD. PA submitted & is pending. Key: [id number]

## 2017-07-04 DIAGNOSIS — H40013 Open angle with borderline findings, low risk, bilateral: Secondary | ICD-10-CM | POA: Diagnosis not present

## 2017-07-04 DIAGNOSIS — E119 Type 2 diabetes mellitus without complications: Secondary | ICD-10-CM | POA: Diagnosis not present

## 2017-07-04 DIAGNOSIS — H25013 Cortical age-related cataract, bilateral: Secondary | ICD-10-CM | POA: Diagnosis not present

## 2017-07-04 DIAGNOSIS — H524 Presbyopia: Secondary | ICD-10-CM | POA: Diagnosis not present

## 2017-07-04 LAB — HM DIABETES EYE EXAM

## 2017-07-04 LAB — BASIC METABOLIC PANEL: Glucose: 130

## 2017-07-04 LAB — HEMOGLOBIN A1C: Hemoglobin A1C: 7.4

## 2017-07-04 NOTE — Telephone Encounter (Signed)
PA re-submitted to [employer]. Awaiting decision.

## 2017-07-09 DIAGNOSIS — M21962 Unspecified acquired deformity of left lower leg: Secondary | ICD-10-CM | POA: Diagnosis not present

## 2017-07-09 DIAGNOSIS — M21961 Unspecified acquired deformity of right lower leg: Secondary | ICD-10-CM | POA: Diagnosis not present

## 2017-07-09 DIAGNOSIS — E1351 Other specified diabetes mellitus with diabetic peripheral angiopathy without gangrene: Secondary | ICD-10-CM | POA: Diagnosis not present

## 2017-07-09 DIAGNOSIS — L602 Onychogryphosis: Secondary | ICD-10-CM | POA: Diagnosis not present

## 2017-07-10 ENCOUNTER — Encounter: Payer: Self-pay | Admitting: Family Medicine

## 2017-07-12 DIAGNOSIS — H25012 Cortical age-related cataract, left eye: Secondary | ICD-10-CM | POA: Diagnosis not present

## 2017-07-12 DIAGNOSIS — H25812 Combined forms of age-related cataract, left eye: Secondary | ICD-10-CM | POA: Diagnosis not present

## 2017-07-12 DIAGNOSIS — H52222 Regular astigmatism, left eye: Secondary | ICD-10-CM | POA: Diagnosis not present

## 2017-07-12 DIAGNOSIS — H2512 Age-related nuclear cataract, left eye: Secondary | ICD-10-CM | POA: Diagnosis not present

## 2017-07-18 DIAGNOSIS — N401 Enlarged prostate with lower urinary tract symptoms: Secondary | ICD-10-CM | POA: Diagnosis not present

## 2017-07-19 ENCOUNTER — Other Ambulatory Visit: Payer: Self-pay | Admitting: Family Medicine

## 2017-07-23 DIAGNOSIS — L03031 Cellulitis of right toe: Secondary | ICD-10-CM | POA: Diagnosis not present

## 2017-07-23 DIAGNOSIS — R351 Nocturia: Secondary | ICD-10-CM | POA: Diagnosis not present

## 2017-07-23 DIAGNOSIS — N401 Enlarged prostate with lower urinary tract symptoms: Secondary | ICD-10-CM | POA: Diagnosis not present

## 2017-07-23 DIAGNOSIS — R3914 Feeling of incomplete bladder emptying: Secondary | ICD-10-CM | POA: Diagnosis not present

## 2017-07-31 NOTE — Progress Notes (Addendum)
Subjective:   Robert Warner is a 76 y.o. male who presents for Medicare Annual/Subsequent preventive examination. The Patient was informed that the wellness visit is to identify future health risk and educate and initiate measures that can reduce risk for increased disease through the lifespan.    Annual Wellness Assessment  Reports health as good   Married 2 children  [place] children 2  Retired  Nurse, adult -Counseling & Management  Medicare Annual Preventive Care Visit - Subsequent Last OV 8/20 Last colonoscopy 06/2008  Health Maintenance Due  Topic Date Due  . INFLUENZA VACCINE  06/13/2017   Took today  PSA  Eye exam 06/2017   Review last visit  Try to stay away from white bread  Breakfast; bran cereal and nuts Lunch; subway; vegetables  Will cut back on bread  2 veg and meat for dinner  Walnuts     BMI 29   Exercise Great exercise routine but harder to get started Agrees to continue to walk- jog - 49miles 3 times a week and do his strength building exercises 2 days a week Loves to play golf      Sleep patterns: sleeps well  Goes to bed at 10 and up at 6   Pain? No     Cardiac Risk Factors Addressed Hyperlipidemia - chol 193; trig 60; hdl 53; LDL 128   Diabetes 6.8 last year   Advanced Directives completed   @Patient  Care Team: [name], MD as PCP - General (Family Medicine)   Patient Care Team: [name], MD as PCP - General (Family Medicine) Seeing [facility] for bladder frequency;  Cataract left x 2 weeks OD in Dec;  Dr. Satira Sark  Retired 2006; dx with Type 2 DM     Cardiac Risk Factors include: advanced age (>50men, >17 women);diabetes mellitus;hypertension;male gender     Objective:    Vitals: BP (!) 144/96   Pulse 71   Ht 5\' 11"  (1.803 m)   Wt 209 lb (94.8 kg)   SpO2 97%   BMI 29.15 kg/m   Body mass index is 29.15 kg/m.  Tobacco History  Smoking Status  . Former Smoker  . Packs/day: 0.50    . Years: 10.00  . Quit date: 11/13/1966  Smokeless Tobacco  . Never Used     Counseling given: Yes   Past Medical History:  Diagnosis Date  . Diabetes mellitus   . Hyperlipidemia   . Hypertension    Past Surgical History:  Procedure Laterality Date  . APPENDECTOMY  1968  . COLONOSCOPY  [id number]   Negative, Dr. Sharlett Iles   . PROSTATE BIOPSY  2006   Dr.Sigmund Tannebaum   Family History  Problem Relation Age of Onset  . Stroke Father 90  . Hypertension Mother   . Coronary artery disease Mother   . Diabetes Maternal Aunt   . Pancreatic cancer Brother   . Stroke Paternal Grandmother   . Breast cancer Paternal Aunt   . Coronary artery disease Paternal Aunt   . Coronary artery disease Maternal Grandmother   . Coronary artery disease Maternal Uncle        2 Maternal Uncles   . Diabetes Maternal Uncle   . Heart disease Neg Hx    History  Sexual Activity  . Sexual activity: Not on file    Outpatient Encounter Prescriptions as of 08/01/2017  Medication Sig  . aspirin 81 MG tablet Take 81 mg by mouth daily.  . Cholecalciferol (VITAMIN  D3) 2000 UNITS TABS Take by mouth daily.  Marland Kitchen glucose blood (FREESTYLE LITE) test strip CHECK BLOOD SUGAR ONCE DAILY.  Dx:E11.9  . Lancets (FREESTYLE) lancets Check blood sugar once daily  . metFORMIN (GLUCOPHAGE-XR) 500 MG 24 hr tablet TAKE 1 TABLET BY MOUTH TWICE A DAY  . pravastatin (PRAVACHOL) 20 MG tablet TAKE 1 TABLET DAILY  . silodosin (RAPAFLO) 4 MG CAPS capsule Take 4 mg by mouth daily with supper.   Dorette Grate Z 0.004 % SOLN ophthalmic solution    No facility-administered encounter medications on file as of 08/01/2017.     Activities of Daily Living In your present state of health, do you have any difficulty performing the following activities: 08/01/2017  Hearing? N  Vision? N  Difficulty concentrating or making decisions? N  Walking or climbing stairs? N  Dressing or bathing? N  Doing errands, shopping? N  Preparing Food and  eating ? N  Using the Toilet? N  In the past six months, have you accidently leaked urine? Y  Do you have problems with loss of bowel control? N  Managing your Medications? N  Managing your Finances? N  Housekeeping or managing your Housekeeping? N  Some recent data might be hidden    Patient Care Team: [name], MD as PCP - General (Family Medicine)   Assessment:     Exercise Activities and Dietary recommendations Current Exercise Habits: Structured exercise class, Time (Minutes): 45, Frequency (Times/Week): 5, Weekly Exercise (Minutes/Week): 225, Intensity: Moderate  Goals    . Weight (lb) < 200 lb (90.7 kg)          Weight to 195lb Exercise; 2 miles walk; run, sit ups; push ups  60 to 75         Fall Risk Fall Risk  08/01/2017 07/14/2016 06/07/2015 10/28/2014 04/29/2014  Falls in the past year? No No No No No  Comment - Emmi Telephone Survey: data to providers prior to load - - -   Depression Screen PHQ 2/9 Scores 08/01/2017 06/07/2015 10/28/2014 04/29/2014  PHQ - 2 Score 0 0 0 0    Cognitive Function   Ad8 score reviewed for issues:  Issues making decisions:  Less interest in hobbies / activities:  Repeats questions, stories (family complaining):  Trouble using ordinary gadgets (microwave, computer, phone):  Forgets the month or year:   Mismanaging finances:   Remembering appts:  Daily problems with thinking and/or memory: Ad8 score is=0          Immunization History  Administered Date(s) Administered  . Influenza Whole 08/02/2009, 08/13/2012  . Influenza, High Dose Seasonal PF 08/04/2013, 07/14/2016, 08/01/2017  . Influenza,inj,Quad PF,6+ Mos 07/31/2014  . Pneumococcal Conjugate-13 06/07/2015  . Pneumococcal Polysaccharide-23 10/15/2013  . Td 06/15/2008  . Zoster 07/31/2014   Screening Tests Health Maintenance  Topic Date Due  . INFLUENZA VACCINE  06/13/2017  . HEMOGLOBIN A1C  01/04/2018  . TETANUS/TDAP  06/15/2018  . FOOT EXAM   07/02/2018  . URINE MICROALBUMIN  07/02/2018  . OPHTHALMOLOGY EXAM  07/04/2018  . PNA vac Low Risk Adult  Completed      Plan:      PCP Notes   Health Maintenance Took flu vaccine today   Educated regarding the shingrix   Abnormal Screens  BP slightly elevated but was stressed this am when he arrived Working with UR MD  Regarding  ua frequency at hs BMI 29 and set goal to lose weight  Referrals  None  Does see  podiatry   Patient concerns;  BS now returned to normal; states Glumetza worked well for him and the switch to Metformin ER has given him some gas, but his BS are doing better; They were in the 140;s fasting but have now returned to normal. Will continue on the metformin unless he has other issues    Nurse Concerns; As noted   Next PCP apt/ last apt was 8/20 Will fup 09/2017       I have personally reviewed and noted the following in the patient's chart:   . Medical and social history . Use of alcohol, tobacco or illicit drugs  . Current medications and supplements . Functional ability and status . Nutritional status . Physical activity . Advanced directives . List of other physicians . Hospitalizations, surgeries, and ER visits in previous 12 months . Vitals . Screenings to include cognitive, depression, and falls . Referrals and appointments  In addition, I have reviewed and discussed with patient certain preventive protocols, quality metrics, and best practice recommendations. A written personalized care plan for preventive services as well as general preventive health recommendations were provided to patient.     XIHWT,UUEKC, RN  08/01/2017  Agree with assessment as above.  [name] MD [facility] at [facility]

## 2017-08-01 ENCOUNTER — Ambulatory Visit (INDEPENDENT_AMBULATORY_CARE_PROVIDER_SITE_OTHER): Payer: Medicare Other

## 2017-08-01 DIAGNOSIS — Z23 Encounter for immunization: Secondary | ICD-10-CM | POA: Diagnosis not present

## 2017-08-01 DIAGNOSIS — Z0000 Encounter for general adult medical examination without abnormal findings: Secondary | ICD-10-CM | POA: Diagnosis not present

## 2017-08-01 NOTE — Patient Instructions (Addendum)
[name] , Thank you for taking time to come for your Medicare Wellness Visit. I appreciate your ongoing commitment to your health goals. Please review the following plan we discussed and let me know if I can assist you in the future.   Shingrix is a vaccine for the prevention of Shingles in Adults 50 and older.  If you are on Medicare, you can request a prescription from your doctor to be filled at a pharmacy.  Please check with your benefits regarding applicable copays or out of pocket expenses.  The Shingrix is given in 2 vaccines approx 8 weeks apart. You must receive the 2nd dose prior to 6 months from receipt of the first.    These are the goals we discussed: Goals    . Weight (lb) < 200 lb (90.7 kg)          Weight to 195lb Exercise; 2 miles walk; run, sit ups; push ups  60 to 75          This is a list of the screening recommended for you and due dates:  Health Maintenance  Topic Date Due  . Flu Shot  06/13/2017  . Hemoglobin A1C  01/04/2018  . Tetanus Vaccine  06/15/2018  . Complete foot exam   07/02/2018  . Urine Protein Check  07/02/2018  . Eye exam for diabetics  07/04/2018  . Pneumonia vaccines  Completed    Veterans want to access their VA benefits can call [facility]  [address] [place], [place] [address]  Phone: [phone] Or [phone] Fax: [phone]  OR   They can to to the [facility] office and call [phone] and If the prompt starts, hit 0 and then ask for eligibility. They will assist you to sign up for medical benefits.    Fall Prevention in the Home Falls can cause injuries. They can happen to people of all ages. There are many things you can do to make your home safe and to help prevent falls. What can I do on the outside of my home?  Regularly fix the edges of walkways and driveways and fix any cracks.  Remove anything that might make you trip as you walk through a door, such as a raised  step or threshold.  Trim any bushes or trees on the path to your home.  Use bright outdoor lighting.  Clear any walking paths of anything that might make someone trip, such as rocks or tools.  Regularly check to see if handrails are loose or broken. Make sure that both sides of any steps have handrails.  Any raised decks and porches should have guardrails on the edges.  Have any leaves, snow, or ice cleared regularly.  Use sand or salt on walking paths during winter.  Clean up any spills in your garage right away. This includes oil or grease spills. What can I do in the bathroom?  Use night lights.  Install grab bars by the toilet and in the tub and shower. Do not use towel bars as grab bars.  Use non-skid mats or decals in the tub or shower.  If you need to sit down in the shower, use a plastic, non-slip stool.  Keep the floor dry. Clean up any water that spills on the floor as soon as it happens.  Remove soap buildup in the tub or shower regularly.  Attach bath mats securely with double-sided non-slip rug tape.  Do not have throw rugs and  other things on the floor that can make you trip. What can I do in the bedroom?  Use night lights.  Make sure that you have a light by your bed that is easy to reach.  Do not use any sheets or blankets that are too big for your bed. They should not hang down onto the floor.  Have a firm chair that has side arms. You can use this for support while you get dressed.  Do not have throw rugs and other things on the floor that can make you trip. What can I do in the kitchen?  Clean up any spills right away.  Avoid walking on wet floors.  Keep items that you use a lot in easy-to-reach places.  If you need to reach something above you, use a strong step stool that has a grab bar.  Keep electrical cords out of the way.  Do not use floor polish or wax that makes floors slippery. If you must use wax, use non-skid floor wax.  Do not  have throw rugs and other things on the floor that can make you trip. What can I do with my stairs?  Do not leave any items on the stairs.  Make sure that there are handrails on both sides of the stairs and use them. Fix handrails that are broken or loose. Make sure that handrails are as long as the stairways.  Check any carpeting to make sure that it is firmly attached to the stairs. Fix any carpet that is loose or worn.  Avoid having throw rugs at the top or bottom of the stairs. If you do have throw rugs, attach them to the floor with carpet tape.  Make sure that you have a light switch at the top of the stairs and the bottom of the stairs. If you do not have them, ask someone to add them for you. What else can I do to help prevent falls?  Wear shoes that: ? Do not have high heels. ? Have rubber bottoms. ? Are comfortable and fit you well. ? Are closed at the toe. Do not wear sandals.  If you use a stepladder: ? Make sure that it is fully opened. Do not climb a closed stepladder. ? Make sure that both sides of the stepladder are locked into place. ? Ask someone to hold it for you, if possible.  Clearly mark and make sure that you can see: ? Any grab bars or handrails. ? First and last steps. ? Where the edge of each step is.  Use tools that help you move around (mobility aids) if they are needed. These include: ? Canes. ? Walkers. ? Scooters. ? Crutches.  Turn on the lights when you go into a dark area. Replace any light bulbs as soon as they burn out.  Set up your furniture so you have a clear path. Avoid moving your furniture around.  If any of your floors are uneven, fix them.  If there are any pets around you, be aware of where they are.  Review your medicines with your doctor. Some medicines can make you feel dizzy. This can increase your chance of falling. Ask your doctor what other things that you can do to help prevent falls. This information is not intended  to replace advice given to you by your health care provider. Make sure you discuss any questions you have with your health care provider. Document Released: 08/26/2009 Document Revised: 04/06/2016 Document Reviewed: 12/04/2014 Elsevier Interactive  Patient Education  2018 [place] Maintenance, Male A healthy lifestyle and preventive care is important for your health and wellness. Ask your health care provider about what schedule of regular examinations is right for you. What should I know about weight and diet? Eat a Healthy Diet  Eat plenty of vegetables, fruits, whole grains, low-fat dairy products, and lean protein.  Do not eat a lot of foods high in solid fats, added sugars, or salt.  Maintain a Healthy Weight Regular exercise can help you achieve or maintain a healthy weight. You should:  Do at least 150 minutes of exercise each week. The exercise should increase your heart rate and make you sweat (moderate-intensity exercise).  Do strength-training exercises at least twice a week.  Watch Your Levels of Cholesterol and Blood Lipids  Have your blood tested for lipids and cholesterol every 5 years starting at 78 years of age. If you are at high risk for heart disease, you should start having your blood tested when you are 78 years old. You may need to have your cholesterol levels checked more often if: ? Your lipid or cholesterol levels are high. ? You are older than 78 years of age. ? You are at high risk for heart disease.  What should I know about cancer screening? Many types of cancers can be detected early and may often be prevented. Lung Cancer  You should be screened every year for lung cancer if: ? You are a current smoker who has smoked for at least 30 years. ? You are a former smoker who has quit within the past 15 years.  Talk to your health care provider about your screening options, when you should start screening, and how often you should be  screened.  Colorectal Cancer  Routine colorectal cancer screening usually begins at 78 years of age and should be repeated every 5-10 years until you are 78 years old. You may need to be screened more often if early forms of precancerous polyps or small growths are found. Your health care provider may recommend screening at an earlier age if you have risk factors for colon cancer.  Your health care provider may recommend using home test kits to check for hidden blood in the stool.  A small camera at the end of a tube can be used to examine your colon (sigmoidoscopy or colonoscopy). This checks for the earliest forms of colorectal cancer.  Prostate and Testicular Cancer  Depending on your age and overall health, your health care provider may do certain tests to screen for prostate and testicular cancer.  Talk to your health care provider about any symptoms or concerns you have about testicular or prostate cancer.  Skin Cancer  Check your skin from head to toe regularly.  Tell your health care provider about any new moles or changes in moles, especially if: ? There is a change in a mole's size, shape, or color. ? You have a mole that is larger than a pencil eraser.  Always use sunscreen. Apply sunscreen liberally and repeat throughout the day.  Protect yourself by wearing long sleeves, pants, a wide-brimmed hat, and sunglasses when outside.  What should I know about heart disease, diabetes, and high blood pressure?  If you are 62-31 years of age, have your blood pressure checked every 3-5 years. If you are 48 years of age or older, have your blood pressure checked every year. You should have your blood pressure measured twice-once when you are  at a hospital or clinic, and once when you are not at a hospital or clinic. Record the average of the two measurements. To check your blood pressure when you are not at a hospital or clinic, you can use: ? An automated blood pressure machine at a  pharmacy. ? A home blood pressure monitor.  Talk to your health care provider about your target blood pressure.  If you are between 32-94 years old, ask your health care provider if you should take aspirin to prevent heart disease.  Have regular diabetes screenings by checking your fasting blood sugar level. ? If you are at a normal weight and have a low risk for diabetes, have this test once every three years after the age of 79. ? If you are overweight and have a high risk for diabetes, consider being tested at a younger age or more often.  A one-time screening for abdominal aortic aneurysm (AAA) by ultrasound is recommended for men aged 22-75 years who are current or former smokers. What should I know about preventing infection? Hepatitis B If you have a higher risk for hepatitis B, you should be screened for this virus. Talk with your health care provider to find out if you are at risk for hepatitis B infection. Hepatitis C Blood testing is recommended for:  Everyone born from [birth year] through 1967.  Anyone with known risk factors for hepatitis C.  Sexually Transmitted Diseases (STDs)  You should be screened each year for STDs including gonorrhea and chlamydia if: ? You are sexually active and are younger than 78 years of age. ? You are older than 78 years of age and your health care provider tells you that you are at risk for this type of infection. ? Your sexual activity has changed since you were last screened and you are at an increased risk for chlamydia or gonorrhea. Ask your health care provider if you are at risk.  Talk with your health care provider about whether you are at high risk of being infected with HIV. Your health care provider may recommend a prescription medicine to help prevent HIV infection.  What else can I do?  Schedule regular health, dental, and eye exams.  Stay current with your vaccines (immunizations).  Do not use any tobacco products, such as  cigarettes, chewing tobacco, and e-cigarettes. If you need help quitting, ask your health care provider.  Limit alcohol intake to no more than 2 drinks per day. One drink equals 12 ounces of beer, 5 ounces of wine, or 1 ounces of hard liquor.  Do not use street drugs.  Do not share needles.  Ask your health care provider for help if you need support or information about quitting drugs.  Tell your health care provider if you often feel depressed.  Tell your health care provider if you have ever been abused or do not feel safe at home. This information is not intended to replace advice given to you by your health care provider. Make sure you discuss any questions you have with your health care provider. Document Released: 04/27/2008 Document Revised: 06/28/2016 Document Reviewed: 08/03/2015 Elsevier Interactive Patient Education  [employer].

## 2017-08-02 ENCOUNTER — Encounter: Payer: Self-pay | Admitting: Family Medicine

## 2017-08-07 DIAGNOSIS — L03031 Cellulitis of right toe: Secondary | ICD-10-CM | POA: Diagnosis not present

## 2017-10-01 ENCOUNTER — Ambulatory Visit (INDEPENDENT_AMBULATORY_CARE_PROVIDER_SITE_OTHER): Payer: Medicare Other | Admitting: Family Medicine

## 2017-10-01 ENCOUNTER — Encounter: Payer: Self-pay | Admitting: Family Medicine

## 2017-10-01 VITALS — BP 130/90 | HR 72 | Temp 97.9°F | Wt 210.8 lb

## 2017-10-01 DIAGNOSIS — E119 Type 2 diabetes mellitus without complications: Secondary | ICD-10-CM | POA: Diagnosis not present

## 2017-10-01 LAB — POCT GLYCOSYLATED HEMOGLOBIN (HGB A1C): HEMOGLOBIN A1C: 6.9

## 2017-10-01 NOTE — Patient Instructions (Signed)
Try to lose some weight. Keep sodium intake < 2,500 mg per day Let's plan on 4 month follow up.

## 2017-10-01 NOTE — Progress Notes (Signed)
Subjective:     Patient ID: Robert Warner, male   DOB: [birth year], 76 y.o.   MRN: [id number]  HPI Patient seen for medical follow-up.  Type 2 diabetes. Last A1c 7.4%. Has been more diligent with diet and states he had gotten his weight down to 200 pounds but has gained about 10 back. Just came back from a cruise. Home blood sugars have been improved with occasional fasting as low as 88.  Only takes metformin. No polyuria or polydipsia. Has history of borderline elevated blood pressure. Currently not treated with medication. Recent urine microalbumin negative. No headaches or dizziness. No chest pains. Walks for exercise.  Ongoing BPH symptoms. Followed by urologist. He's trying to avoid surgery.  Past Medical History:  Diagnosis Date  . Diabetes mellitus   . Hyperlipidemia   . Hypertension    Past Surgical History:  Procedure Laterality Date  . APPENDECTOMY  1968  . CATARACT EXTRACTION Left 07/2000   states 2 weeks ago (first of sept) OD due in Dec   . COLONOSCOPY  [id number]   Negative, Dr. Sharlett Iles   . PROSTATE BIOPSY  2006   Dr.Sigmund Tannebaum    reports that he quit smoking about 50 years ago. He has a 5.00 pack-year smoking history. he has never used smokeless tobacco. He reports that he drinks about 1.2 oz of alcohol per week. He reports that he does not use drugs. family history includes Breast cancer in his paternal aunt; Coronary artery disease in his maternal grandmother, maternal uncle, mother, and paternal aunt; Diabetes in his maternal aunt and maternal uncle; Hypertension in his mother; Pancreatic cancer in his brother; Stroke in his paternal grandmother; Stroke (age of onset: 81) in his father. No Known Allergies   Review of Systems  Constitutional: Negative for fatigue and unexpected weight change.  Eyes: Negative for visual disturbance.  Respiratory: Negative for cough, chest tightness and shortness of breath.   Cardiovascular: Negative for chest pain,  palpitations and leg swelling.  Endocrine: Negative for polydipsia and polyuria.  Neurological: Negative for dizziness, syncope, weakness, light-headedness and headaches.       Objective:   Physical Exam  Constitutional: He is oriented to person, place, and time. He appears well-developed and well-nourished.  HENT:  Right Ear: External ear normal.  Left Ear: External ear normal.  Mouth/Throat: Oropharynx is clear and moist.  Eyes: Pupils are equal, round, and reactive to light.  Neck: Neck supple. No thyromegaly present.  Cardiovascular: Normal rate and regular rhythm.  Pulmonary/Chest: Effort normal and breath sounds normal. No respiratory distress. He has no wheezes. He has no rales.  Musculoskeletal: He exhibits no edema.  Neurological: He is alert and oriented to person, place, and time.       Assessment:     #1 type 2 diabetes. Improved with A1c 6.9%  #2 borderline elevated blood pressure. Repeat after rest 132/88    Plan:     -We challenged him to try to lose 10 pounds between now and next visit in 4 months -Keep sodium intake less than 2500 mg daily -Continue regular aerobic activity  [name] MD [facility] at [facility]

## 2017-10-17 DIAGNOSIS — L03032 Cellulitis of left toe: Secondary | ICD-10-CM | POA: Diagnosis not present

## 2017-10-17 DIAGNOSIS — L03031 Cellulitis of right toe: Secondary | ICD-10-CM | POA: Diagnosis not present

## 2017-10-18 DIAGNOSIS — H2181 Floppy iris syndrome: Secondary | ICD-10-CM | POA: Diagnosis not present

## 2017-10-18 DIAGNOSIS — H2511 Age-related nuclear cataract, right eye: Secondary | ICD-10-CM | POA: Diagnosis not present

## 2017-10-18 DIAGNOSIS — H25811 Combined forms of age-related cataract, right eye: Secondary | ICD-10-CM | POA: Diagnosis not present

## 2017-10-18 DIAGNOSIS — H52221 Regular astigmatism, right eye: Secondary | ICD-10-CM | POA: Diagnosis not present

## 2017-10-18 DIAGNOSIS — H25011 Cortical age-related cataract, right eye: Secondary | ICD-10-CM | POA: Diagnosis not present

## 2018-01-15 ENCOUNTER — Other Ambulatory Visit: Payer: Self-pay | Admitting: Family Medicine

## 2018-01-28 ENCOUNTER — Encounter: Payer: Self-pay | Admitting: Family Medicine

## 2018-01-28 ENCOUNTER — Ambulatory Visit (INDEPENDENT_AMBULATORY_CARE_PROVIDER_SITE_OTHER): Payer: Medicare Other | Admitting: Family Medicine

## 2018-01-28 VITALS — BP 120/88 | HR 76 | Temp 97.7°F | Wt 204.1 lb

## 2018-01-28 DIAGNOSIS — E119 Type 2 diabetes mellitus without complications: Secondary | ICD-10-CM

## 2018-01-28 LAB — HEMOGLOBIN A1C: HEMOGLOBIN A1C: 7.4 % — AB (ref 4.6–6.5)

## 2018-01-28 NOTE — Progress Notes (Signed)
Subjective:     Patient ID: Robert Warner, male   DOB: [birth year], 77 y.o.   MRN: [id number]  HPI Patient seen for follow-up type 2 diabetes. He also elevated blood pressure here 4 months ago. We challenged him to lose 10 pounds and has lost about 6 pounds. Fasting blood sugars been stable. Last A1c 6.9%. Medications reviewed and compliant with all. Remains on metformin for his type 2 diabetes.  Past Medical History:  Diagnosis Date  . Diabetes mellitus   . Hyperlipidemia   . Hypertension    Past Surgical History:  Procedure Laterality Date  . APPENDECTOMY  1968  . CATARACT EXTRACTION Left 07/2000   states 2 weeks ago (first of sept) OD due in Dec   . COLONOSCOPY  [id number]   Negative, Dr. Sharlett Iles   . PROSTATE BIOPSY  2006   Dr.Sigmund Tannebaum    reports that he quit smoking about 51 years ago. He has a 5.00 pack-year smoking history. he has never used smokeless tobacco. He reports that he drinks about 1.2 oz of alcohol per week. He reports that he does not use drugs. family history includes Breast cancer in his paternal aunt; Coronary artery disease in his maternal grandmother, maternal uncle, mother, and paternal aunt; Diabetes in his maternal aunt and maternal uncle; Hypertension in his mother; Pancreatic cancer in his brother; Stroke in his paternal grandmother; Stroke (age of onset: 3) in his father. No Known Allergies   Review of Systems  Constitutional: Negative for fatigue.  Eyes: Negative for visual disturbance.  Respiratory: Negative for cough, chest tightness and shortness of breath.   Cardiovascular: Negative for chest pain, palpitations and leg swelling.  Endocrine: Negative for polydipsia and polyuria.  Neurological: Negative for dizziness, syncope, weakness, light-headedness and headaches.       Objective:   Physical Exam  Constitutional: He is oriented to person, place, and time. He appears well-developed and well-nourished.  HENT:  Right Ear: External  ear normal.  Left Ear: External ear normal.  Mouth/Throat: Oropharynx is clear and moist.  Eyes: Pupils are equal, round, and reactive to light.  Neck: Neck supple. No thyromegaly present.  Cardiovascular: Normal rate and regular rhythm.  Pulmonary/Chest: Effort normal and breath sounds normal. No respiratory distress. He has no wheezes. He has no rales.  Musculoskeletal: He exhibits no edema.  Neurological: He is alert and oriented to person, place, and time.       Assessment:     #1Type 2 diabetes. History of good control  #2 elevated blood pressure. Recent borderline elevation somewhat improved today with his recent weight loss     Plan:     -recheck A1c -Continue weight control efforts and low glycemic diet -Follow-up in 6 months and sooner as needed  Robert Post MD [facility] at [facility]

## 2018-01-30 ENCOUNTER — Encounter: Payer: Self-pay | Admitting: Family Medicine

## 2018-01-30 ENCOUNTER — Telehealth: Payer: Self-pay | Admitting: Family Medicine

## 2018-01-30 DIAGNOSIS — H40013 Open angle with borderline findings, low risk, bilateral: Secondary | ICD-10-CM | POA: Diagnosis not present

## 2018-01-30 DIAGNOSIS — H2000 Unspecified acute and subacute iridocyclitis: Secondary | ICD-10-CM | POA: Diagnosis not present

## 2018-01-30 NOTE — Telephone Encounter (Signed)
Copied from [place]. Topic: Quick Communication - Rx Refill/Question >> Jan 30, 2018  9:32 AM Scherrie Gerlach wrote: Medication: metFORMIN (GLUCOPHAGE-XR) 500 MG 24 hr tablet  Pt states he would like a call back to discuss changing this med.  Pt just seen 3/18 and says his A1C is high and he is convinced this med is the reason and it is not working for him.

## 2018-01-30 NOTE — Telephone Encounter (Signed)
Left message on machine for patient to return our call.  CRM created.   More information needed.

## 2018-01-30 NOTE — Telephone Encounter (Signed)
Left message on machine for patient.  Note sent through mychart.

## 2018-01-31 MED ORDER — SITAGLIPTIN PHOSPHATE 100 MG PO TABS: 100.0000 mg | ORAL_TABLET | Freq: Every day | ORAL | 5 refills | Status: DC

## 2018-02-04 DIAGNOSIS — R3914 Feeling of incomplete bladder emptying: Secondary | ICD-10-CM | POA: Diagnosis not present

## 2018-02-04 DIAGNOSIS — N401 Enlarged prostate with lower urinary tract symptoms: Secondary | ICD-10-CM | POA: Diagnosis not present

## 2018-03-22 ENCOUNTER — Other Ambulatory Visit: Payer: Self-pay | Admitting: *Deleted

## 2018-03-22 MED ORDER — SITAGLIPTIN PHOSPHATE 100 MG PO TABS: 100.0000 mg | ORAL_TABLET | Freq: Every day | ORAL | 1 refills | Status: DC

## 2018-03-22 MED ORDER — METFORMIN HCL ER 500 MG PO TB24: 500.0000 mg | ORAL_TABLET | Freq: Two times a day (BID) | ORAL | 1 refills | Status: DC

## 2018-04-18 ENCOUNTER — Encounter: Payer: Self-pay | Admitting: Family Medicine

## 2018-05-27 ENCOUNTER — Ambulatory Visit (INDEPENDENT_AMBULATORY_CARE_PROVIDER_SITE_OTHER): Payer: Medicare Other | Admitting: Family Medicine

## 2018-05-27 ENCOUNTER — Encounter: Payer: Self-pay | Admitting: Family Medicine

## 2018-05-27 VITALS — BP 120/84 | HR 68 | Temp 97.8°F | Wt 207.8 lb

## 2018-05-27 DIAGNOSIS — E785 Hyperlipidemia, unspecified: Secondary | ICD-10-CM | POA: Diagnosis not present

## 2018-05-27 DIAGNOSIS — E119 Type 2 diabetes mellitus without complications: Secondary | ICD-10-CM | POA: Diagnosis not present

## 2018-05-27 DIAGNOSIS — R030 Elevated blood-pressure reading, without diagnosis of hypertension: Secondary | ICD-10-CM | POA: Diagnosis not present

## 2018-05-27 DIAGNOSIS — N401 Enlarged prostate with lower urinary tract symptoms: Secondary | ICD-10-CM | POA: Diagnosis not present

## 2018-05-27 LAB — BASIC METABOLIC PANEL
BUN: 16 mg/dL (ref 6–23)
CHLORIDE: 103 meq/L (ref 96–112)
CO2: 28 mEq/L (ref 19–32)
Calcium: 9.5 mg/dL (ref 8.4–10.5)
Creatinine, Ser: 1.29 mg/dL (ref 0.40–1.50)
GFR: 69.09 mL/min (ref >60.00)
GLUCOSE: 107 mg/dL — AB (ref 70–99)
POTASSIUM: 4.6 meq/L (ref 3.5–5.1)
Sodium: 139 mEq/L (ref 135–145)

## 2018-05-27 LAB — POCT GLYCOSYLATED HEMOGLOBIN (HGB A1C): HEMOGLOBIN A1C: 6.1 % — AB (ref 4.0–5.6)

## 2018-05-27 LAB — HEPATIC FUNCTION PANEL
ALT: 15 U/L (ref 0–53)
AST: 22 U/L (ref 0–37)
Albumin: 4.1 g/dL (ref 3.5–5.2)
Alkaline Phosphatase: 56 U/L (ref 39–117)
BILIRUBIN TOTAL: 0.7 mg/dL (ref 0.2–1.2)
Bilirubin, Direct: 0.2 mg/dL (ref 0.0–0.3)
Total Protein: 7 g/dL (ref 6.0–8.3)

## 2018-05-27 LAB — LIPID PANEL
CHOL/HDL RATIO: 3
Cholesterol: 160 mg/dL (ref 0–200)
HDL: 51.8 mg/dL (ref >39.00)
LDL Cholesterol: 95 mg/dL (ref 0–99)
NonHDL: 108.57
TRIGLYCERIDES: 66 mg/dL (ref 0.0–149.0)
VLDL: 13.2 mg/dL (ref 0.0–40.0)

## 2018-05-27 NOTE — Patient Instructions (Signed)
Remember Flu vaccine this Fall.

## 2018-05-27 NOTE — Progress Notes (Signed)
  Subjective:     Patient ID: Robert Warner, male   DOB: [birth year], 77 y.o.   MRN: [id number]  HPI Patient here for medical follow-up  Type 2 diabetes. Recently had some elevated fasting readings and we added Januvia to his metformin. He has seen great improvement since then. Most of his fastings have been either below 100 or low 100 range. His weight is actually up a few pounds from last visit. He states he is being fairly diligent with diet and restricting sugars and starches. A1c improved today at 6.1%. No side effects from Januvia  Hyperlipidemia. On pravastatin. No myalgias. No recent chest pains.  He has history of BPH currently stable with Rapaflo. Minimal nocturia.  Past Medical History:  Diagnosis Date  . Diabetes mellitus   . Hyperlipidemia   . Hypertension    Past Surgical History:  Procedure Laterality Date  . APPENDECTOMY  1968  . CATARACT EXTRACTION Left 07/2000   states 2 weeks ago (first of sept) OD due in Dec   . COLONOSCOPY  [id number]   Negative, Dr. Sharlett Iles   . PROSTATE BIOPSY  2006   Dr.Sigmund Tannebaum    reports that he quit smoking about 51 years ago. He has a 5.00 pack-year smoking history. He has never used smokeless tobacco. He reports that he drinks about 1.2 oz of alcohol per week. He reports that he does not use drugs. family history includes Breast cancer in his paternal aunt; Coronary artery disease in his maternal grandmother, maternal uncle, mother, and paternal aunt; Diabetes in his maternal aunt and maternal uncle; Hypertension in his mother; Pancreatic cancer in his brother; Stroke in his paternal grandmother; Stroke (age of onset: 34) in his father. No Known Allergies   Review of Systems  Constitutional: Negative for appetite change, fatigue and unexpected weight change.  Eyes: Negative for visual disturbance.  Respiratory: Negative for cough, chest tightness and shortness of breath.   Cardiovascular: Negative for chest pain, palpitations  and leg swelling.  Gastrointestinal: Negative for abdominal pain.  Endocrine: Negative for polydipsia and polyuria.  Genitourinary: Negative for difficulty urinating and dysuria.  Neurological: Negative for dizziness, syncope, weakness, light-headedness and headaches.       Objective:   Physical Exam  Constitutional: He is oriented to person, place, and time. He appears well-developed and well-nourished.  Cardiovascular: Normal rate and regular rhythm.  Pulmonary/Chest: Effort normal and breath sounds normal.  Musculoskeletal: He exhibits no edema.  Neurological: He is alert and oriented to person, place, and time.  Skin:  Feet reveal no skin lesions. Good distal foot pulses. Good capillary refill. No calluses. Normal sensation with monofilament testing        Assessment:     #1 type 2 diabetes well controlled with A1c 6.1%  #2 history of dyslipidemia. Patient on pravastatin  #3 history of BPH symptomatically stable with Rapaflo    Plan:     -Continue current diabetes regimen -Reminder for flu vaccine this fall -Obtain further labs today with lipid panel, hepatic panel, basic metabolic panel  [name] MD [facility] at [facility]

## 2018-06-05 DIAGNOSIS — H401132 Primary open-angle glaucoma, bilateral, moderate stage: Secondary | ICD-10-CM | POA: Diagnosis not present

## 2018-06-11 ENCOUNTER — Other Ambulatory Visit: Payer: Self-pay | Admitting: Family Medicine

## 2018-06-19 ENCOUNTER — Encounter: Payer: Self-pay | Admitting: Gastroenterology

## 2018-06-21 ENCOUNTER — Encounter: Payer: Self-pay | Admitting: Gastroenterology

## 2018-06-29 ENCOUNTER — Encounter: Payer: Self-pay | Admitting: Family Medicine

## 2018-07-01 NOTE — Telephone Encounter (Signed)
Dr. Elease Hashimoto, please advise.  Thank you.   I am scheduled to see Dr. Remo Lipps P. Armbruster at [facility] on 08-21-18 and he has asked that I bring a letter a referral form from my primary care physician.  Would you provide such. Thanks

## 2018-07-17 DIAGNOSIS — M21961 Unspecified acquired deformity of right lower leg: Secondary | ICD-10-CM | POA: Diagnosis not present

## 2018-07-17 DIAGNOSIS — E1351 Other specified diabetes mellitus with diabetic peripheral angiopathy without gangrene: Secondary | ICD-10-CM | POA: Diagnosis not present

## 2018-07-17 DIAGNOSIS — L602 Onychogryphosis: Secondary | ICD-10-CM | POA: Diagnosis not present

## 2018-07-17 DIAGNOSIS — M21962 Unspecified acquired deformity of left lower leg: Secondary | ICD-10-CM | POA: Diagnosis not present

## 2018-08-02 ENCOUNTER — Ambulatory Visit: Payer: Medicare Other

## 2018-08-05 ENCOUNTER — Ambulatory Visit: Payer: Medicare Other | Admitting: Family Medicine

## 2018-08-19 DIAGNOSIS — N401 Enlarged prostate with lower urinary tract symptoms: Secondary | ICD-10-CM | POA: Diagnosis not present

## 2018-08-19 DIAGNOSIS — R3912 Poor urinary stream: Secondary | ICD-10-CM | POA: Diagnosis not present

## 2018-08-21 ENCOUNTER — Encounter: Payer: Self-pay | Admitting: Gastroenterology

## 2018-08-21 ENCOUNTER — Ambulatory Visit (INDEPENDENT_AMBULATORY_CARE_PROVIDER_SITE_OTHER): Payer: Medicare Other | Admitting: Gastroenterology

## 2018-08-21 VITALS — BP 130/88 | HR 84 | Ht 70.0 in | Wt 209.4 lb

## 2018-08-21 DIAGNOSIS — Z1211 Encounter for screening for malignant neoplasm of colon: Secondary | ICD-10-CM | POA: Diagnosis not present

## 2018-08-21 NOTE — Progress Notes (Signed)
HPI :  77 year old male with a history of diabetes, arthritis, hypertension, here for new patient visit to discuss colon cancer screening.  Here colonoscopy in 2009 which was normal with an "excellent prep" per Dr. Sharlett Iles. He denies any problems with his bowels that bother him. He denies any constipation or diarrhea. He denies any blood in his stools. He does not have any abdominal pains. He eats well, no weight loss. He is very active and golfs routinely. He denies any family history of colon cancer. He's had multiple siblings pass away in their 61s.  Colonoscopy 07/06/2008 - normal, done for positive FOBT   Past Medical History:  Diagnosis Date  . Arthritis   . Diabetes mellitus   . Hyperlipidemia   . Hypertension      Past Surgical History:  Procedure Laterality Date  . APPENDECTOMY  1968  . CATARACT EXTRACTION Left 07/2000   states 2 weeks ago (first of sept) OD due in Dec   . COLONOSCOPY  9528,4132   Negative, Dr. Sharlett Iles   . PROSTATE BIOPSY  2006   Dr.Sigmund Tannebaum   Family History  Problem Relation Age of Onset  . Stroke Father 69  . Hypertension Mother   . Coronary artery disease Mother   . Diabetes Maternal Aunt   . Pancreatic cancer Brother   . Stroke Paternal Grandmother   . Breast cancer Paternal Aunt   . Coronary artery disease Paternal Aunt   . Coronary artery disease Maternal Grandmother   . Coronary artery disease Maternal Uncle        2 Maternal Uncles   . Diabetes Maternal Uncle   . Stroke Sister   . Heart disease Neg Hx    Social History   Tobacco Use  . Smoking status: Former Smoker    Packs/day: 0.50    Years: 10.00    Pack years: 5.00    Last attempt to quit: 11/13/1966    Years since quitting: 51.8  . Smokeless tobacco: Never Used  Substance Use Topics  . Alcohol use: Yes    Alcohol/week: 2.0 standard drinks    Types: 2 Glasses of wine per week    Comment: Occasional wine or beer  . Drug use: No   Current Outpatient  Medications  Medication Sig Dispense Refill  . aspirin 81 MG tablet Take 81 mg by mouth daily.    . Cholecalciferol (VITAMIN D3) 2000 UNITS TABS Take by mouth daily.    Marland Kitchen glucose blood (FREESTYLE LITE) test strip CHECK BLOOD SUGAR ONCE DAILY.  Dx:E11.9 100 each 3  . Lancets (FREESTYLE) lancets Check blood sugar once daily 100 each 3  . metFORMIN (GLUCOPHAGE-XR) 500 MG 24 hr tablet Take 1 tablet (500 mg total) by mouth 2 (two) times daily. 180 tablet 1  . pravastatin (PRAVACHOL) 20 MG tablet TAKE 1 TABLET DAILY 90 tablet 3  . silodosin (RAPAFLO) 4 MG CAPS capsule Take 4 mg by mouth daily with supper.     . sitaGLIPtin (JANUVIA) 100 MG tablet Take 1 tablet (100 mg total) by mouth daily. 90 tablet 1  . TRAVATAN Z 0.004 % SOLN ophthalmic solution      No current facility-administered medications for this visit.    No Known Allergies   Review of Systems: All systems reviewed and negative except where noted in HPI.   Lab Results  Component Value Date   WBC 4.6 08/22/2010   HGB 14.8 08/22/2010   HCT 44.7 08/22/2010   MCV 87.4 08/22/2010  PLT 144.0 (L) 08/22/2010    Lab Results  Component Value Date   CREATININE 1.29 05/27/2018   BUN 16 05/27/2018   NA 139 05/27/2018   K 4.6 05/27/2018   CL 103 05/27/2018   CO2 28 05/27/2018    Lab Results  Component Value Date   ALT 15 05/27/2018   AST 22 05/27/2018   ALKPHOS 56 05/27/2018   BILITOT 0.7 05/27/2018     Physical Exam: BP 130/88 (BP Location: Left Arm, Patient Position: Sitting, Cuff Size: Normal)   Pulse 84   Ht 5\' 10"  (1.778 m) Comment: height measured without shoes  Wt 209 lb 6 oz (95 kg)   BMI 30.04 kg/m  Constitutional: Pleasant,well-developed, male in no acute distress. HEENT: Normocephalic and atraumatic. Conjunctivae are normal. No scleral icterus. Neck supple.  Cardiovascular: Normal rate, regular rhythm.  Pulmonary/chest: Effort normal and breath sounds normal. No wheezing, rales or rhonchi. Abdominal:  Soft, nondistended, nontender.  There are no masses palpable. No hepatomegaly. Extremities: no edema Lymphadenopathy: No cervical adenopathy noted. Neurological: Alert and oriented to person place and time. Skin: Skin is warm and dry. No rashes noted. Psychiatric: Normal mood and affect. Behavior is normal.   ASSESSMENT AND PLAN: 77 year old male here for discussion of following issues:  Colon cancer screening - his last colonoscopy was 10 years ago and was normal without polyps. We discussed at his current age of 5 years whether or not he wanted any further colon cancer screening. I discussed screening modalities with him to include stool based testing and colonoscopy. I discussed the risks and benefits of each of these. At his age with a normal prior colonoscopy, guidelines recommend consider stopping screening at age 66; however we must consider the patients comorbidities and life expectancy. At his age he has no significant comorbidities and good life expectancy, and I offered him a colonoscopy. Following our discussion about this issue, he declined colonoscopy. He may consider stool based testing in the future, he wants to discuss that at his yearly physical with his primary care. He understands the implication of a positive stool test, which is a colonoscopy. He can contact me in the future if he has any questions or concerns.  [place] Cellar, MD [facility]

## 2018-09-03 ENCOUNTER — Other Ambulatory Visit: Payer: Self-pay | Admitting: Family Medicine

## 2018-09-27 DIAGNOSIS — N401 Enlarged prostate with lower urinary tract symptoms: Secondary | ICD-10-CM | POA: Diagnosis not present

## 2018-09-27 DIAGNOSIS — R351 Nocturia: Secondary | ICD-10-CM | POA: Diagnosis not present

## 2018-09-27 DIAGNOSIS — R3912 Poor urinary stream: Secondary | ICD-10-CM | POA: Diagnosis not present

## 2018-10-03 ENCOUNTER — Other Ambulatory Visit: Payer: Self-pay | Admitting: Family Medicine

## 2018-10-13 HISTORY — PX: KIDNEY STONE SURGERY: SHX686

## 2018-11-11 DIAGNOSIS — N401 Enlarged prostate with lower urinary tract symptoms: Secondary | ICD-10-CM | POA: Diagnosis not present

## 2018-11-11 DIAGNOSIS — R3914 Feeling of incomplete bladder emptying: Secondary | ICD-10-CM | POA: Diagnosis not present

## 2018-11-14 DIAGNOSIS — R3914 Feeling of incomplete bladder emptying: Secondary | ICD-10-CM | POA: Diagnosis not present

## 2018-11-14 DIAGNOSIS — N401 Enlarged prostate with lower urinary tract symptoms: Secondary | ICD-10-CM | POA: Diagnosis not present

## 2018-11-21 NOTE — Progress Notes (Addendum)
Subjective:   Robert Warner is a 78 y.o. male who presents for Medicare Annual/Subsequent preventive examination.  Review of Systems:  No ROS.  Medicare Wellness Visit. Additional risk factors are reflected in the social history. Cardiac Risk Factors include: advanced age (>5men, >9 women);diabetes mellitus;hypertension;male gender Sleep patterns: Feels overall rested; since urolift surgery, only gets up to urinate 2X in the night, as opposed to 3-4X.     Home Safety/Smoke Alarms: Feels safe in home. Smoke alarms in place.  Living environment; residence and Firearm Safety: 2-story house. Has stairs, but no difficulty in using, aware of importance of using grab bars, pt. Safety-conscious. Seat Belt Safety/Bike Helmet: Wears seat belt.    Male:   CCS- 2009, no need for follow-up as pt. will be over 80yo at 10year recommended mark per colonoscopy report.      PSA- No results found for: PSA   Foot exam due: completed during PCP visit today. Pt. Going to follow-up with podiatrist.      Objective:    Vitals: There were no vitals taken for this visit.  There is no height or weight on file to calculate BMI.  Advanced Directives 11/25/2018 08/01/2017  Does Patient Have a Medical Advance Directive? Yes Yes  Type of Paramedic of [place];Living will -  Does patient want to make changes to medical advance directive? No - Patient declined -  Copy of [place] in Chart? No - copy requested -    Tobacco Social History   Tobacco Use  Smoking Status Former Smoker  . Packs/day: 0.50  . Years: 10.00  . Pack years: 5.00  . Last attempt to quit: 11/13/1966  . Years since quitting: 52.0  Smokeless Tobacco Never Used     Counseling given: Not Answered  Past Medical History:  Diagnosis Date  . Arthritis   . Diabetes mellitus   . Hyperlipidemia   . Hypertension    Past Surgical History:  Procedure Laterality Date  . APPENDECTOMY  1968  .  CATARACT EXTRACTION Left 07/2000   states 2 weeks ago (first of sept) OD due in Dec   . COLONOSCOPY  9470,9628   Negative, Dr. Sharlett Iles   . KIDNEY STONE SURGERY  10/2018  . PROSTATE BIOPSY  2006   Dr.Sigmund Tannebaum   Family History  Problem Relation Age of Onset  . Stroke Father 65  . Hypertension Mother   . Coronary artery disease Mother   . Diabetes Maternal Aunt   . Pancreatic cancer Brother   . Stroke Paternal Grandmother   . Breast cancer Paternal Aunt   . Coronary artery disease Paternal Aunt   . Coronary artery disease Maternal Grandmother   . Coronary artery disease Maternal Uncle        2 Maternal Uncles   . Diabetes Maternal Uncle   . Stroke Sister   . Heart disease Neg Hx    Social History   Socioeconomic History  . Marital status: Married    Spouse name: Not on file  . Number of children: 2  . Years of education: Not on file  . Highest education level: Not on file  Occupational History  . Occupation: retired Secretary/administrator  . Financial resource strain: Not on file  . Food insecurity:    Worry: Not on file    Inability: Not on file  . Transportation needs:    Medical: Not on file    Non-medical: Not on file  Tobacco Use  . Smoking status: Former Smoker    Packs/day: 0.50    Years: 10.00    Pack years: 5.00    Last attempt to quit: 11/13/1966    Years since quitting: 52.0  . Smokeless tobacco: Never Used  Substance and Sexual Activity  . Alcohol use: Yes    Alcohol/week: 2.0 standard drinks    Types: 2 Glasses of wine per week    Comment: Occasional wine or beer  . Drug use: No  . Sexual activity: Not on file  Lifestyle  . Physical activity:    Days per week: Not on file    Minutes per session: Not on file  . Stress: Not on file  Relationships  . Social connections:    Talks on phone: Not on file    Gets together: Not on file    Attends religious service: Not on file    Active member of club or organization: Not on file     Attends meetings of clubs or organizations: Not on file    Relationship status: Not on file  Other Topics Concern  . Not on file  Social History Narrative  . Not on file    Outpatient Encounter Medications as of 11/25/2018  Medication Sig  . aspirin 81 MG tablet Take 81 mg by mouth daily.  . Cholecalciferol (VITAMIN D3) 2000 UNITS TABS Take by mouth daily.  Marland Kitchen glucose blood (FREESTYLE LITE) test strip CHECK BLOOD SUGAR ONCE DAILY.  Dx:E11.9  . JANUVIA 100 MG tablet TAKE 1 TABLET DAILY  . Lancets (FREESTYLE) lancets Check blood sugar once daily  . metFORMIN (GLUCOPHAGE-XR) 500 MG 24 hr tablet TAKE 1 TABLET TWICE A DAY  . pravastatin (PRAVACHOL) 20 MG tablet TAKE 1 TABLET DAILY  . TRAVATAN Z 0.004 % SOLN ophthalmic solution   . [DISCONTINUED] silodosin (RAPAFLO) 4 MG CAPS capsule Take 4 mg by mouth every other day.    No facility-administered encounter medications on file as of 11/25/2018.     Activities of Daily Living In your present state of health, do you have any difficulty performing the following activities: 11/25/2018 11/25/2018  Hearing? N N  Vision? N N  Difficulty concentrating or making decisions? N N  Walking or climbing stairs? N N  Dressing or bathing? N N  Doing errands, shopping? N N  Preparing Food and eating ? N -  Using the Toilet? N -  In the past six months, have you accidently leaked urine? N -  Do you have problems with loss of bowel control? N -  Managing your Medications? N -  Managing your Finances? N -  Housekeeping or managing your Housekeeping? N -  Comment lives with wife who cannot help, but they hire outside help -  Some recent data might be hidden    Patient Care Team: [name], MD as PCP - General (Family Medicine) [name], MD as Consulting Physician (Ophthalmology) Rosemary Holms, DPM as Consulting Physician (Podiatry) Minor, Orlie Pollen, DDS (Dentistry) Armbruster, Carlota Raspberry, MD as Consulting Physician  (Gastroenterology) Festus Aloe, MD as Consulting Physician (Urology)   Assessment:   This is a routine wellness examination for Robert Warner. Physical assessment deferred to PCP.  Exercise Activities and Dietary recommendations Current Exercise Habits: The patient does not participate in regular exercise at present, Exercise limited by: cardiac condition(s);Other - see comments(past year many deaths in the family) Diet (meal preparation, eat out, water intake, caffeinated beverages, dairy products, fruits and vegetables): in general, a "  healthy" diet  , high fiber  Goals    . Patient Stated     Continue to manage weight, with goal of 195lbs, establish exercise routine    . Weight (lb) < 200 lb (90.7 kg)     Weight to 195lb Exercise; 2 miles walk; run, sit ups; push ups  60 to 75          Fall Risk Fall Risk  11/25/2018 11/25/2018 08/01/2017 07/14/2016 06/07/2015  Falls in the past year? 0 0 No No No  Comment - - - Emmi Telephone Survey: data to providers prior to load -   IDepression Screen PHQ 2/9 Scores 11/25/2018 11/25/2018 08/01/2017 06/07/2015  PHQ - 2 Score 0 0 0 0  PHQ- 9 Score 2 - - -    Cognitive Function       Pt. States he has noticed some short-term recall problems, but nothing severe. Pt. oriented to time,place, situation at all times, no safety concerns.   Ad8 score reviewed for issues:  Issues making decisions: no  Less interest in hobbies / activities: no  Repeats questions, stories (family complaining): no  Trouble using ordinary gadgets (microwave, computer, phone):no  Forgets the month or year: no  Mismanaging finances: no  Remembering appts: no  Daily problems with thinking and/or memory: no Ad8 score is= 0    Immunization History  Administered Date(s) Administered  . Influenza Whole 08/02/2009, 08/13/2012  . Influenza, High Dose Seasonal PF 08/04/2013, 07/14/2016, 08/01/2017  . Influenza,inj,Quad PF,6+ Mos 07/31/2014  . Pneumococcal  Conjugate-13 06/07/2015  . Pneumococcal Polysaccharide-23 10/15/2013  . Td 06/15/2008  . Zoster 07/31/2014    Screening Tests Health Maintenance  Topic Date Due  . INFLUENZA VACCINE  06/13/2018  . TETANUS/TDAP  06/15/2018  . FOOT EXAM  07/02/2018  . URINE MICROALBUMIN  07/02/2018  . OPHTHALMOLOGY EXAM  07/04/2018  . HEMOGLOBIN A1C  11/27/2018  . PNA vac Low Risk Adult  Completed      Plan:    Continue doing brain stimulating activities (puzzles, reading, adult coloring books, staying active) and sodoku to keep memory sharp.   Refer to [facility] "What is high blood pressure?" resource provided to you.   Continue to eat heart healthy diet (full of fruits, vegetables, whole grains, lean protein, water--limit salt, fat, and sugar intake) and increase physical activity as tolerated. DASH diet and healthy weight loss for diabetics hand-outs provided to patient.  Bring a copy of your living will and/or healthcare power of attorney to your next office visit.  Follow up with Podiatrist  See you in one month! Follow-up appointment made with Dr. Elease Hashimoto. I have personally reviewed and noted the following in the patient's chart:   . Medical and social history . Use of alcohol, tobacco or illicit drugs  . Current medications and supplements . Functional ability and status . Nutritional status . Physical activity . Advanced directives . List of other physicians . Vitals . Screenings to include cognitive, depression, and falls . Referrals and appointments  In addition, I have reviewed and discussed with patient certain preventive protocols, quality metrics, and best practice recommendations. A written personalized care plan for preventive services as well as general preventive health recommendations were provided to patient.     Alphia Moh, RN  11/25/2018  I have reviewed the documentation for the [facility] and [place] provided by the health coach and agree with their  documentation. I was immediately available for any questions  [name] MD  [facility]

## 2018-11-22 DIAGNOSIS — N401 Enlarged prostate with lower urinary tract symptoms: Secondary | ICD-10-CM | POA: Diagnosis not present

## 2018-11-22 DIAGNOSIS — R3914 Feeling of incomplete bladder emptying: Secondary | ICD-10-CM | POA: Diagnosis not present

## 2018-11-22 DIAGNOSIS — R3912 Poor urinary stream: Secondary | ICD-10-CM | POA: Diagnosis not present

## 2018-11-25 ENCOUNTER — Other Ambulatory Visit: Payer: Self-pay

## 2018-11-25 ENCOUNTER — Ambulatory Visit (INDEPENDENT_AMBULATORY_CARE_PROVIDER_SITE_OTHER): Payer: Medicare Other

## 2018-11-25 ENCOUNTER — Ambulatory Visit (INDEPENDENT_AMBULATORY_CARE_PROVIDER_SITE_OTHER): Payer: Medicare Other | Admitting: Family Medicine

## 2018-11-25 ENCOUNTER — Encounter: Payer: Self-pay | Admitting: Family Medicine

## 2018-11-25 VITALS — BP 144/88 | HR 79 | Temp 98.0°F | Ht 70.0 in | Wt 205.7 lb

## 2018-11-25 VITALS — BP 144/88 | HR 79 | Temp 98.0°F | Resp 20 | Ht 70.0 in | Wt 205.0 lb

## 2018-11-25 DIAGNOSIS — R030 Elevated blood-pressure reading, without diagnosis of hypertension: Secondary | ICD-10-CM | POA: Diagnosis not present

## 2018-11-25 DIAGNOSIS — Z0000 Encounter for general adult medical examination without abnormal findings: Secondary | ICD-10-CM | POA: Diagnosis not present

## 2018-11-25 DIAGNOSIS — E785 Hyperlipidemia, unspecified: Secondary | ICD-10-CM | POA: Diagnosis not present

## 2018-11-25 DIAGNOSIS — E119 Type 2 diabetes mellitus without complications: Secondary | ICD-10-CM | POA: Diagnosis not present

## 2018-11-25 LAB — POCT GLYCOSYLATED HEMOGLOBIN (HGB A1C): Hemoglobin A1C: 6.5 % — AB (ref 4.0–5.6)

## 2018-11-25 NOTE — Addendum Note (Signed)
Addended by: Anibal Henderson on: 11/25/2018 09:54 AM   Modules accepted: Orders

## 2018-11-25 NOTE — Patient Instructions (Signed)

## 2018-11-25 NOTE — Patient Instructions (Addendum)
Continue doing brain stimulating activities (puzzles, reading, adult coloring books, staying active) and sodoku to keep memory sharp.   Refer to [facility] "What is high blood pressure?" resource provided to you.   Continue to eat heart healthy diet (full of fruits, vegetables, whole grains, lean protein, water--limit salt, fat, and sugar intake) and increase physical activity as tolerated.  Bring a copy of your living will and/or healthcare power of attorney to your next office visit.  Follow up with Podiatrist  See you in one month!   DASH Eating Plan DASH stands for "Dietary Approaches to Stop Hypertension." The DASH eating plan is a healthy eating plan that has been shown to reduce high blood pressure (hypertension). It may also reduce your risk for type 2 diabetes, heart disease, and stroke. The DASH eating plan may also help with weight loss. What are tips for following this plan?  General guidelines  Avoid eating more than 2,300 mg (milligrams) of salt (sodium) a day. If you have hypertension, you may need to reduce your sodium intake to 1,500 mg a day.  Limit alcohol intake to no more than 1 drink a day for nonpregnant women and 2 drinks a day for men. One drink equals 12 oz of beer, 5 oz of wine, or 1 oz of hard liquor.  Work with your health care provider to maintain a healthy body weight or to lose weight. Ask what an ideal weight is for you.  Get at least 30 minutes of exercise that causes your heart to beat faster (aerobic exercise) most days of the week. Activities may include walking, swimming, or biking.  Work with your health care provider or diet and nutrition specialist (dietitian) to adjust your eating plan to your individual calorie needs. Reading food labels   Check food labels for the amount of sodium per serving. Choose foods with less than 5 percent of the Daily Value of sodium. Generally, foods with less than 300 mg of sodium per serving fit into this eating  plan.  To find whole grains, look for the word "whole" as the first word in the ingredient list. Shopping  Buy products labeled as "low-sodium" or "no salt added."  Buy fresh foods. Avoid canned foods and premade or frozen meals. Cooking  Avoid adding salt when cooking. Use salt-free seasonings or herbs instead of table salt or sea salt. Check with your health care provider or pharmacist before using salt substitutes.  Do not fry foods. Cook foods using healthy methods such as baking, boiling, grilling, and broiling instead.  Cook with heart-healthy oils, such as olive, canola, soybean, or sunflower oil. Meal planning  Eat a balanced diet that includes: ? 5 or more servings of fruits and vegetables each day. At each meal, try to fill half of your plate with fruits and vegetables. ? Up to 6-8 servings of whole grains each day. ? Less than 6 oz of lean meat, poultry, or fish each day. A 3-oz serving of meat is about the same size as a deck of cards. One egg equals 1 oz. ? 2 servings of low-fat dairy each day. ? A serving of nuts, seeds, or beans 5 times each week. ? Heart-healthy fats. Healthy fats called Omega-3 fatty acids are found in foods such as flaxseeds and coldwater fish, like sardines, salmon, and mackerel.  Limit how much you eat of the following: ? Canned or prepackaged foods. ? Food that is high in trans fat, such as fried foods. ? Food that is  high in saturated fat, such as fatty meat. ? Sweets, desserts, sugary drinks, and other foods with added sugar. ? Full-fat dairy products.  Do not salt foods before eating.  Try to eat at least 2 vegetarian meals each week.  Eat more home-cooked food and less restaurant, buffet, and fast food.  When eating at a restaurant, ask that your food be prepared with less salt or no salt, if possible. What foods are recommended? The items listed may not be a complete list. Talk with your dietitian about what dietary choices are best  for you. Grains Whole-grain or whole-wheat bread. Whole-grain or whole-wheat pasta. Brown rice. [place]. Bulgur. Whole-grain and low-sodium cereals. Pita bread. Low-fat, low-sodium crackers. Whole-wheat flour tortillas. Vegetables Fresh or frozen vegetables (raw, steamed, roasted, or grilled). Low-sodium or reduced-sodium tomato and vegetable juice. Low-sodium or reduced-sodium tomato sauce and tomato paste. Low-sodium or reduced-sodium canned vegetables. Fruits All fresh, dried, or frozen fruit. Canned fruit in natural juice (without added sugar). Meat and other protein foods Skinless chicken or [place]. Ground chicken or [place]. Pork with fat trimmed off. Fish and seafood. Egg whites. Dried beans, peas, or lentils. Unsalted nuts, nut butters, and seeds. Unsalted canned beans. Lean cuts of beef with fat trimmed off. Low-sodium, lean deli meat. Dairy Low-fat (1%) or fat-free (skim) milk. Fat-free, low-fat, or reduced-fat cheeses. Nonfat, low-sodium ricotta or cottage cheese. Low-fat or nonfat yogurt. Low-fat, low-sodium cheese. Fats and oils Soft margarine without trans fats. Vegetable oil. Low-fat, reduced-fat, or light mayonnaise and salad dressings (reduced-sodium). Canola, safflower, olive, soybean, and sunflower oils. Avocado. Seasoning and other foods Herbs. Spices. Seasoning mixes without salt. Unsalted popcorn and pretzels. Fat-free sweets. What foods are not recommended? The items listed may not be a complete list. Talk with your dietitian about what dietary choices are best for you. Grains Baked goods made with fat, such as croissants, muffins, or some breads. Dry pasta or rice meal packs. Vegetables Creamed or fried vegetables. Vegetables in a cheese sauce. Regular canned vegetables (not low-sodium or reduced-sodium). Regular canned tomato sauce and paste (not low-sodium or reduced-sodium). Regular tomato and vegetable juice (not low-sodium or reduced-sodium). Angie Fava.  Olives. Fruits Canned fruit in a light or heavy syrup. Fried fruit. Fruit in cream or butter sauce. Meat and other protein foods Fatty cuts of meat. Ribs. Fried meat. Berniece Salines. Sausage. Bologna and other processed lunch meats. Salami. Fatback. Hotdogs. Bratwurst. Salted nuts and seeds. Canned beans with added salt. Canned or smoked fish. Whole eggs or egg yolks. Chicken or [place] with skin. Dairy Whole or 2% milk, cream, and half-and-half. Whole or full-fat cream cheese. Whole-fat or sweetened yogurt. Full-fat cheese. Nondairy creamers. Whipped toppings. Processed cheese and cheese spreads. Fats and oils Butter. Stick margarine. Lard. Shortening. Ghee. Bacon fat. Tropical oils, such as coconut, palm kernel, or palm oil. Seasoning and other foods Salted popcorn and pretzels. Onion salt, garlic salt, seasoned salt, table salt, and sea salt. Worcestershire sauce. Tartar sauce. Barbecue sauce. Teriyaki sauce. Soy sauce, including reduced-sodium. Steak sauce. Canned and packaged gravies. Fish sauce. Oyster sauce. Cocktail sauce. Horseradish that you find on the shelf. Ketchup. Mustard. Meat flavorings and tenderizers. Bouillon cubes. Hot sauce and Tabasco sauce. Premade or packaged marinades. Premade or packaged taco seasonings. Relishes. Regular salad dressings. Where to find more information:  National Heart, Lung, and [place]: https://wilson-eaton.com/  American Heart Association: www.heart.org Summary  The DASH eating plan is a healthy eating plan that has been shown to reduce high blood pressure (hypertension). It  may also reduce your risk for type 2 diabetes, heart disease, and stroke.  With the DASH eating plan, you should limit salt (sodium) intake to 2,300 mg a day. If you have hypertension, you may need to reduce your sodium intake to 1,500 mg a day.  When on the DASH eating plan, aim to eat more fresh fruits and vegetables, whole grains, lean proteins, low-fat dairy, and heart-healthy  fats.  Work with your health care provider or diet and nutrition specialist (dietitian) to adjust your eating plan to your individual calorie needs. This information is not intended to replace advice given to you by your health care provider. Make sure you discuss any questions you have with your health care provider. Document Released: 10/19/2011 Document Revised: 10/23/2016 Document Reviewed: 10/23/2016 Elsevier Interactive Patient Education  2019 Elsevier Inc.     Diabetes Mellitus and [place] care is an important part of your health, especially when you have diabetes. Diabetes may cause you to have problems because of poor blood flow (circulation) to your feet and legs, which can cause your skin to:  Become thinner and drier.  Break more easily.  Heal more slowly.  Peel and crack. You may also have nerve damage (neuropathy) in your legs and feet, causing decreased feeling in them. This means that you may not notice minor injuries to your feet that could lead to more serious problems. Noticing and addressing any potential problems early is the best way to prevent future foot problems. How to care for your feet Foot hygiene  Wash your feet daily with warm water and mild soap. Do not use hot water. Then, pat your feet and the areas between your toes until they are completely dry. Do not soak your feet as this can dry your skin.  Trim your toenails straight across. Do not dig under them or around the cuticle. File the edges of your nails with an emery board or nail file.  Apply a moisturizing lotion or petroleum jelly to the skin on your feet and to dry, brittle toenails. Use lotion that does not contain alcohol and is unscented. Do not apply lotion between your toes. Shoes and socks  Wear clean socks or stockings every day. Make sure they are not too tight. Do not wear knee-high stockings since they may decrease blood flow to your legs.  Wear shoes that fit properly and have  enough cushioning. Always look in your shoes before you put them on to be sure there are no objects inside.  To break in new shoes, wear them for just a few hours a day. This prevents injuries on your feet. Wounds, scrapes, corns, and calluses  Check your feet daily for blisters, cuts, bruises, sores, and redness. If you cannot see the bottom of your feet, use a mirror or ask someone for help.  Do not cut corns or calluses or try to remove them with medicine.  If you find a minor scrape, cut, or break in the skin on your feet, keep it and the skin around it clean and dry. You may clean these areas with mild soap and water. Do not clean the area with peroxide, alcohol, or iodine.  If you have a wound, scrape, corn, or callus on your foot, look at it several times a day to make sure it is healing and not infected. Check for: ? Redness, swelling, or pain. ? Fluid or blood. ? Warmth. ? Pus or a bad smell. General instructions  Do not  cross your legs. This may decrease blood flow to your feet.  Do not use heating pads or hot water bottles on your feet. They may burn your skin. If you have lost feeling in your feet or legs, you may not know this is happening until it is too late.  Protect your feet from hot and cold by wearing shoes, such as at the beach or on hot pavement.  Schedule a complete foot exam at least once a year (annually) or more often if you have foot problems. If you have foot problems, report any cuts, sores, or bruises to your health care provider immediately. Contact a health care provider if:  You have a medical condition that increases your risk of infection and you have any cuts, sores, or bruises on your feet.  You have an injury that is not healing.  You have redness on your legs or feet.  You feel burning or tingling in your legs or feet.  You have pain or cramps in your legs and feet.  Your legs or feet are numb.  Your feet always feel cold.  You have pain  around a toenail. Get help right away if:  You have a wound, scrape, corn, or callus on your foot and: ? You have pain, swelling, or redness that gets worse. ? You have fluid or blood coming from the wound, scrape, corn, or callus. ? Your wound, scrape, corn, or callus feels warm to the touch. ? You have pus or a bad smell coming from the wound, scrape, corn, or callus. ? You have a fever. ? You have a red line going up your leg. Summary  Check your feet every day for cuts, sores, red spots, swelling, and blisters.  Moisturize feet and legs daily.  Wear shoes that fit properly and have enough cushioning.  If you have foot problems, report any cuts, sores, or bruises to your health care provider immediately.  Schedule a complete foot exam at least once a year (annually) or more often if you have foot problems. This information is not intended to replace advice given to you by your health care provider. Make sure you discuss any questions you have with your health care provider. Document Released: 10/27/2000 Document Revised: 12/12/2017 Document Reviewed: 12/01/2016 Elsevier Interactive Patient Education  2019 [place] Prevention in the Home, Adult Falls can cause injuries. They can happen to people of all ages. There are many things you can do to make your home safe and to help prevent falls. Ask for help when making these changes, if needed. What actions can I take to prevent falls? General Instructions  Use good lighting in all rooms. Replace any light bulbs that burn out.  Turn on the lights when you go into a dark area. Use night-lights.  Keep items that you use often in easy-to-reach places. Lower the shelves around your home if necessary.  Set up your furniture so you have a clear path. Avoid moving your furniture around.  Do not have throw rugs and other things on the floor that can make you trip.  Avoid walking on wet floors.  If any of your floors are  uneven, fix them.  Add color or contrast paint or tape to clearly mark and help you see: ? Any grab bars or handrails. ? First and last steps of stairways. ? Where the edge of each step is.  If you use a stepladder: ? Make sure that it is fully opened.  Do not climb a closed stepladder. ? Make sure that both sides of the stepladder are locked into place. ? Ask someone to hold the stepladder for you while you use it.  If there are any pets around you, be aware of where they are. What can I do in the bathroom?      Keep the floor dry. Clean up any water that spills onto the floor as soon as it happens.  Remove soap buildup in the tub or shower regularly.  Use non-skid mats or decals on the floor of the tub or shower.  Attach bath mats securely with double-sided, non-slip rug tape.  If you need to sit down in the shower, use a plastic, non-slip stool.  Install grab bars by the toilet and in the tub and shower. Do not use towel bars as grab bars. What can I do in the bedroom?  Make sure that you have a light by your bed that is easy to reach.  Do not use any sheets or blankets that are too big for your bed. They should not hang down onto the floor.  Have a firm chair that has side arms. You can use this for support while you get dressed. What can I do in the kitchen?  Clean up any spills right away.  If you need to reach something above you, use a strong step stool that has a grab bar.  Keep electrical cords out of the way.  Do not use floor polish or wax that makes floors slippery. If you must use wax, use non-skid floor wax. What can I do with my stairs?  Do not leave any items on the stairs.  Make sure that you have a light switch at the top of the stairs and the bottom of the stairs. If you do not have them, ask someone to add them for you.  Make sure that there are handrails on both sides of the stairs, and use them. Fix handrails that are broken or loose. Make sure  that handrails are as long as the stairways.  Install non-slip stair treads on all stairs in your home.  Avoid having throw rugs at the top or bottom of the stairs. If you do have throw rugs, attach them to the floor with carpet tape.  Choose a carpet that does not hide the edge of the steps on the stairway.  Check any carpeting to make sure that it is firmly attached to the stairs. Fix any carpet that is loose or worn. What can I do on the outside of my home?  Use bright outdoor lighting.  Regularly fix the edges of walkways and driveways and fix any cracks.  Remove anything that might make you trip as you walk through a door, such as a raised step or threshold.  Trim any bushes or trees on the path to your home.  Regularly check to see if handrails are loose or broken. Make sure that both sides of any steps have handrails.  Install guardrails along the edges of any raised decks and porches.  Clear walking paths of anything that might make someone trip, such as tools or rocks.  Have any leaves, snow, or ice cleared regularly.  Use sand or salt on walking paths during winter.  Clean up any spills in your garage right away. This includes grease or oil spills. What other actions can I take?  Wear shoes that: ? Have a low heel. Do not wear high heels. ?  Have rubber bottoms. ? Are comfortable and fit you well. ? Are closed at the toe. Do not wear open-toe sandals.  Use tools that help you move around (mobility aids) if they are needed. These include: ? Canes. ? Walkers. ? Scooters. ? Crutches.  Review your medicines with your doctor. Some medicines can make you feel dizzy. This can increase your chance of falling. Ask your doctor what other things you can do to help prevent falls. Where to find more information  Centers for Disease Control and Prevention, [place]: https://garcia.biz/  [employer] on Aging: BrainJudge.co.uk Contact a doctor if:  You are  afraid of falling at home.  You feel weak, drowsy, or dizzy at home.  You fall at home. Summary  There are many simple things that you can do to make your home safe and to help prevent falls.  Ways to make your home safe include removing tripping hazards and installing grab bars in the bathroom.  Ask for help when making these changes in your home. This information is not intended to replace advice given to you by your health care provider. Make sure you discuss any questions you have with your health care provider. Document Released: 08/26/2009 Document Revised: 06/14/2017 Document Reviewed: 06/14/2017 Elsevier Interactive Patient Education  2019 [place] Maintenance, Male A healthy lifestyle and preventive care is important for your health and wellness. Ask your health care provider about what schedule of regular examinations is right for you. What should I know about weight and diet? Eat a Healthy Diet  Eat plenty of vegetables, fruits, whole grains, low-fat dairy products, and lean protein.  Do not eat a lot of foods high in solid fats, added sugars, or salt.  Maintain a Healthy Weight Regular exercise can help you achieve or maintain a healthy weight. You should:  Do at least 150 minutes of exercise each week. The exercise should increase your heart rate and make you sweat (moderate-intensity exercise).  Do strength-training exercises at least twice a week. Watch Your Levels of Cholesterol and Blood Lipids  Have your blood tested for lipids and cholesterol every 5 years starting at 80 years of age. If you are at high risk for heart disease, you should start having your blood tested when you are 80 years old. You may need to have your cholesterol levels checked more often if: ? Your lipid or cholesterol levels are high. ? You are older than 80 years of age. ? You are at high risk for heart disease. What should I know about cancer screening? Many types of  cancers can be detected early and may often be prevented. Lung Cancer  You should be screened every year for lung cancer if: ? You are a current smoker who has smoked for at least 30 years. ? You are a former smoker who has quit within the past 15 years.  Talk to your health care provider about your screening options, when you should start screening, and how often you should be screened. Colorectal Cancer  Routine colorectal cancer screening usually begins at 80 years of age and should be repeated every 5-10 years until you are 80 years old. You may need to be screened more often if early forms of precancerous polyps or small growths are found. Your health care provider may recommend screening at an earlier age if you have risk factors for colon cancer.  Your health care provider may recommend using home test kits to check for hidden blood in  the stool.  A small camera at the end of a tube can be used to examine your colon (sigmoidoscopy or colonoscopy). This checks for the earliest forms of colorectal cancer. Prostate and Testicular Cancer  Depending on your age and overall health, your health care provider may do certain tests to screen for prostate and testicular cancer.  Talk to your health care provider about any symptoms or concerns you have about testicular or prostate cancer. Skin Cancer  Check your skin from head to toe regularly.  Tell your health care provider about any new moles or changes in moles, especially if: ? There is a change in a mole's size, shape, or color. ? You have a mole that is larger than a pencil eraser.  Always use sunscreen. Apply sunscreen liberally and repeat throughout the day.  Protect yourself by wearing long sleeves, pants, a wide-brimmed hat, and sunglasses when outside. What should I know about heart disease, diabetes, and high blood pressure?  If you are 50-17 years of age, have your blood pressure checked every 3-5 years. If you are 48 years  of age or older, have your blood pressure checked every year. You should have your blood pressure measured twice-once when you are at a hospital or clinic, and once when you are not at a hospital or clinic. Record the average of the two measurements. To check your blood pressure when you are not at a hospital or clinic, you can use: ? An automated blood pressure machine at a pharmacy. ? A home blood pressure monitor.  Talk to your health care provider about your target blood pressure.  If you are between 78-59 years old, ask your health care provider if you should take aspirin to prevent heart disease.  Have regular diabetes screenings by checking your fasting blood sugar level. ? If you are at a normal weight and have a low risk for diabetes, have this test once every three years after the age of 16. ? If you are overweight and have a high risk for diabetes, consider being tested at a younger age or more often.  A one-time screening for abdominal aortic aneurysm (AAA) by ultrasound is recommended for men aged 46-75 years who are current or former smokers. What should I know about preventing infection? Hepatitis B If you have a higher risk for hepatitis B, you should be screened for this virus. Talk with your health care provider to find out if you are at risk for hepatitis B infection. Hepatitis C Blood testing is recommended for:  Everyone born from [birth year] through 1967.  Anyone with known risk factors for hepatitis C. Sexually Transmitted Diseases (STDs)  You should be screened each year for STDs including gonorrhea and chlamydia if: ? You are sexually active and are younger than 80 years of age. ? You are older than 80 years of age and your health care provider tells you that you are at risk for this type of infection. ? Your sexual activity has changed since you were last screened and you are at an increased risk for chlamydia or gonorrhea. Ask your health care provider if you are at  risk.  Talk with your health care provider about whether you are at high risk of being infected with HIV. Your health care provider may recommend a prescription medicine to help prevent HIV infection. What else can I do?  Schedule regular health, dental, and eye exams.  Stay current with your vaccines (immunizations).  Do not use any tobacco  products, such as cigarettes, chewing tobacco, and e-cigarettes. If you need help quitting, ask your health care provider.  Limit alcohol intake to no more than 2 drinks per day. One drink equals 12 ounces of beer, 5 ounces of wine, or 1 ounces of hard liquor.  Do not use street drugs.  Do not share needles.  Ask your health care provider for help if you need support or information about quitting drugs.  Tell your health care provider if you often feel depressed.  Tell your health care provider if you have ever been abused or do not feel safe at home. This information is not intended to replace advice given to you by your health care provider. Make sure you discuss any questions you have with your health care provider. Document Released: 04/27/2008 Document Revised: 06/28/2016 Document Reviewed: 08/03/2015 Elsevier Interactive Patient Education  2019 [employer].

## 2018-11-25 NOTE — Progress Notes (Signed)
Subjective:     Patient ID: Robert Warner, male   DOB: 12/17/1940, 78 y.o.   MRN: [id number]  HPI Patient is seen for medical follow-up.  He just in late December had uro-lift procedure for BPH.  He is doing well at this time urinating much better.  Is tapering off Rapaflo.  He has type 2 diabetes currently treated with metformin and Januvia and his blood sugars have been stable by home readings.  Last A1c 6.1%.  He has had a couple of siblings die during the past year.  One brother died of unknown cause in his mid 28s and he had a sister that died of stroke complications.  They were both older than him.  Patient met with gastroenterologist earlier this year.  They decided against any further colonoscopies.  Patient continues to play golf.  Walks some for exercise.  Quit smoking 1968.  Past Medical History:  Diagnosis Date  . Arthritis   . Diabetes mellitus   . Hyperlipidemia   . Hypertension    Past Surgical History:  Procedure Laterality Date  . APPENDECTOMY  1968  . CATARACT EXTRACTION Left 07/2000   states 2 weeks ago (first of sept) OD due in Dec   . COLONOSCOPY  [id number]   Negative, Dr. Sharlett Iles   . KIDNEY STONE SURGERY  10/2018  . PROSTATE BIOPSY  2006   Dr.Sigmund Tannebaum    reports that he quit smoking about 52 years ago. He has a 5.00 pack-year smoking history. He has never used smokeless tobacco. He reports current alcohol use of about 2.0 standard drinks of alcohol per week. He reports that he does not use drugs. family history includes Breast cancer in his paternal aunt; Coronary artery disease in his maternal grandmother, maternal uncle, mother, and paternal aunt; Diabetes in his maternal aunt and maternal uncle; Hypertension in his mother; Pancreatic cancer in his brother; Stroke in his paternal grandmother and sister; Stroke (age of onset: 37) in his father. No Known Allergies     Review of Systems  Constitutional: Negative for fatigue and unexpected weight  change.  Eyes: Negative for visual disturbance.  Respiratory: Negative for cough, chest tightness and shortness of breath.   Cardiovascular: Negative for chest pain, palpitations and leg swelling.  Endocrine: Negative for polydipsia and polyuria.  Neurological: Negative for dizziness, syncope, weakness, light-headedness and headaches.       Objective:   Physical Exam Constitutional:      Appearance: He is well-developed.  HENT:     Right Ear: External ear normal.     Left Ear: External ear normal.  Eyes:     Pupils: Pupils are equal, round, and reactive to light.  Neck:     Musculoskeletal: Neck supple.     Thyroid: No thyromegaly.  Cardiovascular:     Rate and Rhythm: Normal rate and regular rhythm.  Pulmonary:     Effort: Pulmonary effort is normal. No respiratory distress.     Breath sounds: Normal breath sounds. No wheezing or rales.  Skin:    Comments: Feet reveal no skin lesions. Good distal foot pulses. Good capillary refill. No calluses. Normal sensation with monofilament testing   Neurological:     Mental Status: He is alert and oriented to person, place, and time.        Assessment:     #1 type 2 diabetes well-controlled with A1c 6.5%  #2 hyperlipidemia treated with pravastatin.  No myalgias or other side effects.  His lipids from July  were reviewed and stable  #3 elevated blood pressure.  No prior diagnosis of hypertension.  Not monitoring regularly at home.  No recent headaches or dizziness.    Plan:     -Continue current medications -He is encouraged to lose some weight -Bring back in 1 month to reassess and if blood pressure not closer to goal at visit consider medication -Patient has visit with our health coach later today  [name] MD [facility]

## 2018-12-04 DIAGNOSIS — H52202 Unspecified astigmatism, left eye: Secondary | ICD-10-CM | POA: Diagnosis not present

## 2018-12-04 DIAGNOSIS — H401132 Primary open-angle glaucoma, bilateral, moderate stage: Secondary | ICD-10-CM | POA: Diagnosis not present

## 2018-12-04 DIAGNOSIS — E113292 Type 2 diabetes mellitus with mild nonproliferative diabetic retinopathy without macular edema, left eye: Secondary | ICD-10-CM | POA: Diagnosis not present

## 2018-12-04 DIAGNOSIS — Z961 Presence of intraocular lens: Secondary | ICD-10-CM | POA: Diagnosis not present

## 2019-01-03 ENCOUNTER — Other Ambulatory Visit: Payer: Self-pay

## 2019-01-03 ENCOUNTER — Encounter: Payer: Self-pay | Admitting: Family Medicine

## 2019-01-03 ENCOUNTER — Ambulatory Visit (INDEPENDENT_AMBULATORY_CARE_PROVIDER_SITE_OTHER): Payer: Medicare Other | Admitting: Family Medicine

## 2019-01-03 ENCOUNTER — Ambulatory Visit: Payer: Medicare Other | Admitting: Family Medicine

## 2019-01-03 VITALS — BP 126/82 | HR 74 | Temp 97.7°F | Ht 70.0 in | Wt 206.1 lb

## 2019-01-03 DIAGNOSIS — R030 Elevated blood-pressure reading, without diagnosis of hypertension: Secondary | ICD-10-CM

## 2019-01-03 NOTE — Patient Instructions (Signed)
Consider bringing your cuff at follow up to compare with ours  Monitor blood pressure and be in touch if consistently > 140/90.

## 2019-01-03 NOTE — Progress Notes (Signed)
  Subjective:     Patient ID: Robert Warner, male   DOB: May 09, 1941, 78 y.o.   MRN: [id number]  HPI Patient here to reassess blood pressure.  Refer to previous note.  He had reading here 144/88.  No diagnosis of hypertension.  We recommend that he monitor closely and he brings in several readings today.  Unfortunately, his home blood pressure monitor varies considerably.  For example, 1 day he took his blood pressure and obtained reading 159/94 and just a couple minutes later had reading 128/87.  No headaches.  No dizziness.  No flushing.  No peripheral edema.  Exercising regularly.  No added salt.  Past Medical History:  Diagnosis Date  . Arthritis   . Diabetes mellitus   . Hyperlipidemia   . Hypertension    Past Surgical History:  Procedure Laterality Date  . APPENDECTOMY  1968  . CATARACT EXTRACTION Left 07/2000   states 2 weeks ago (first of sept) OD due in Dec   . COLONOSCOPY  [id number]   Negative, Dr. Sharlett Iles   . KIDNEY STONE SURGERY  10/2018  . PROSTATE BIOPSY  2006   Dr.Sigmund Tannebaum    reports that he quit smoking about 52 years ago. He has a 5.00 pack-year smoking history. He has never used smokeless tobacco. He reports current alcohol use of about 2.0 standard drinks of alcohol per week. He reports that he does not use drugs. family history includes Breast cancer in his paternal aunt; Coronary artery disease in his maternal grandmother, maternal uncle, mother, and paternal aunt; Diabetes in his maternal aunt and maternal uncle; Hypertension in his mother; Pancreatic cancer in his brother; Stroke in his paternal grandmother and sister; Stroke (age of onset: 38) in his father. No Known Allergies   Review of Systems  Constitutional: Negative for fatigue.  Eyes: Negative for visual disturbance.  Respiratory: Negative for cough, chest tightness and shortness of breath.   Cardiovascular: Negative for chest pain, palpitations and leg swelling.  Neurological: Negative for  dizziness, syncope, weakness, light-headedness and headaches.       Objective:   Physical Exam Constitutional:      Appearance: He is well-developed.  Eyes:     Pupils: Pupils are equal, round, and reactive to light.  Neck:     Musculoskeletal: Neck supple.     Thyroid: No thyromegaly.  Cardiovascular:     Rate and Rhythm: Normal rate and regular rhythm.  Pulmonary:     Effort: Pulmonary effort is normal. No respiratory distress.     Breath sounds: Normal breath sounds. No wheezing or rales.  Neurological:     Mental Status: He is alert.        Assessment:     Elevated blood pressure readings.  Blood pressure is improved today.  I did get slightly high reading 138/88 on repeat left arm seated after rest    Plan:     -Recommend continued monitoring for now. -We have suggested that he bring in his home blood pressure cuff at next visit to compare with ours. -We have challenged him to try to get his weight down to about 195 pounds and continue regular exercise and watching sodium intake. -Be in touch of consistent blood pressure readings greater than 140/90.  Otherwise we will plan 81-month follow-up  [name] MD [facility] at [facility]

## 2019-01-06 DIAGNOSIS — N401 Enlarged prostate with lower urinary tract symptoms: Secondary | ICD-10-CM | POA: Diagnosis not present

## 2019-01-06 DIAGNOSIS — R3912 Poor urinary stream: Secondary | ICD-10-CM | POA: Diagnosis not present

## 2019-04-01 ENCOUNTER — Other Ambulatory Visit: Payer: Self-pay | Admitting: Family Medicine

## 2019-04-29 DIAGNOSIS — N5201 Erectile dysfunction due to arterial insufficiency: Secondary | ICD-10-CM | POA: Diagnosis not present

## 2019-04-29 DIAGNOSIS — R3912 Poor urinary stream: Secondary | ICD-10-CM | POA: Diagnosis not present

## 2019-04-29 DIAGNOSIS — N401 Enlarged prostate with lower urinary tract symptoms: Secondary | ICD-10-CM | POA: Diagnosis not present

## 2019-05-12 ENCOUNTER — Telehealth: Payer: Self-pay | Admitting: Family Medicine

## 2019-05-12 NOTE — Telephone Encounter (Signed)
Pt called in and stated that they have a recall on metFORMIN (GLUCOPHAGE-XR) 500 MG 24 hr tablet [[id number] .  Pt is completely out this med, he needs a new med sent in to the local pharmacy today because he does not have any meds left.  From what he is understanding it is the [facility] of this [address], [place] [phone] (Phone) [phone] (Fax   Best number for pt [phone]

## 2019-05-13 ENCOUNTER — Telehealth: Payer: Self-pay | Admitting: Family Medicine

## 2019-05-13 ENCOUNTER — Other Ambulatory Visit: Payer: Self-pay

## 2019-05-13 MED ORDER — METFORMIN HCL ER 500 MG PO TB24: 500.0000 mg | ORAL_TABLET | Freq: Two times a day (BID) | ORAL | 0 refills | Status: DC

## 2019-05-13 NOTE — Telephone Encounter (Signed)
Please see message. Next OV 07/07/19

## 2019-05-13 NOTE — Telephone Encounter (Signed)
We don't really have an alternative/equivalent to ER Metformin if unable to take immediate release.  We might need to set up Doxy to discuss possible alternatives/options.Marland Kitchen

## 2019-05-13 NOTE — Telephone Encounter (Signed)
Pt called in and stated that they have a recall on metFORMIN (GLUCOPHAGE-XR) 500 MG 24 hr tablet [[id number] .  Pt is completely out this med, he needs a new med sent in to the local pharmacy today because he does not have any meds left.  From what he is understanding it is the [facility] of this [address], [place] [phone] (Phone) [phone] (Fax   Best number for pt [phone]

## 2019-05-13 NOTE — Telephone Encounter (Signed)
Called patient and he stated that he only needed a new script to receive the new batch of medication as the other batch was contaminated. Medication has been sent to [facility]. He usually uses [facility].

## 2019-05-31 ENCOUNTER — Other Ambulatory Visit: Payer: Self-pay | Admitting: Family Medicine

## 2019-06-04 DIAGNOSIS — H401112 Primary open-angle glaucoma, right eye, moderate stage: Secondary | ICD-10-CM | POA: Diagnosis not present

## 2019-07-07 ENCOUNTER — Other Ambulatory Visit: Payer: Self-pay

## 2019-07-07 ENCOUNTER — Encounter: Payer: Self-pay | Admitting: Family Medicine

## 2019-07-07 ENCOUNTER — Ambulatory Visit (INDEPENDENT_AMBULATORY_CARE_PROVIDER_SITE_OTHER): Payer: Medicare Other | Admitting: Family Medicine

## 2019-07-07 VITALS — BP 122/82 | HR 68 | Temp 98.0°F | Ht 70.0 in | Wt 208.5 lb

## 2019-07-07 DIAGNOSIS — E785 Hyperlipidemia, unspecified: Secondary | ICD-10-CM

## 2019-07-07 DIAGNOSIS — Z23 Encounter for immunization: Secondary | ICD-10-CM

## 2019-07-07 DIAGNOSIS — E119 Type 2 diabetes mellitus without complications: Secondary | ICD-10-CM

## 2019-07-07 DIAGNOSIS — R030 Elevated blood-pressure reading, without diagnosis of hypertension: Secondary | ICD-10-CM

## 2019-07-07 LAB — BASIC METABOLIC PANEL
BUN: 18 mg/dL (ref 6–23)
CO2: 28 mEq/L (ref 19–32)
Calcium: 9.3 mg/dL (ref 8.4–10.5)
Chloride: 104 mEq/L (ref 96–112)
Creatinine, Ser: 1.23 mg/dL (ref 0.40–1.50)
GFR: 68.48 mL/min (ref >60.00)
Glucose, Bld: 117 mg/dL — ABNORMAL HIGH (ref 70–99)
Potassium: 4.8 mEq/L (ref 3.5–5.1)
Sodium: 138 mEq/L (ref 135–145)

## 2019-07-07 LAB — MICROALBUMIN / CREATININE URINE RATIO
Creatinine,U: 155 mg/dL
Microalb Creat Ratio: 0.5 mg/g (ref 0.0–30.0)
Microalb, Ur: <0.7 mg/dL (ref 0.0–1.9)

## 2019-07-07 LAB — LIPID PANEL
Cholesterol: 162 mg/dL (ref 0–200)
HDL: 48.3 mg/dL (ref >39.00)
LDL Cholesterol: 98 mg/dL (ref 0–99)
NonHDL: 113.5
Total CHOL/HDL Ratio: 3
Triglycerides: 80 mg/dL (ref 0.0–149.0)
VLDL: 16 mg/dL (ref 0.0–40.0)

## 2019-07-07 LAB — POCT GLYCOSYLATED HEMOGLOBIN (HGB A1C): Hemoglobin A1C: 6.4 % — AB (ref 4.0–5.6)

## 2019-07-07 LAB — HEPATIC FUNCTION PANEL
ALT: 15 U/L (ref 0–53)
AST: 24 U/L (ref 0–37)
Albumin: 4.2 g/dL (ref 3.5–5.2)
Alkaline Phosphatase: 57 U/L (ref 39–117)
Bilirubin, Direct: 0.2 mg/dL (ref 0.0–0.3)
Total Bilirubin: 1 mg/dL (ref 0.2–1.2)
Total Protein: 6.8 g/dL (ref 6.0–8.3)

## 2019-07-07 NOTE — Progress Notes (Signed)
Subjective:     Patient ID: Robert Warner, male   DOB: 18-Sep-1941, 78 y.o.   MRN: [id number]  HPI Patient is here for medical follow-up.  He has history of BPH, type 2 diabetes, hyperlipidemia.  History of elevated blood pressure in the past but currently controlled by several home readings.  He brings in his meter today for comparison but we obtain reading much higher than what we got with his meter.  His home readings have consistently been around [id number] systolic and 70 diastolic.  Fasting blood sugars have remained very well controlled ranging generally from around 84 to 115.  He remains on pravastatin for hyperlipidemia.  No recent chest pains.  No dizziness.  Compliant with medications.  Playing golf about 3 times per week and generally feels well.  He sees podiatrist every 3 months.  He sees ophthalmologist every 6 months.  Just had recent eye exam  Needs flu vaccine  Past Medical History:  Diagnosis Date  . Arthritis   . Diabetes mellitus   . Hyperlipidemia   . Hypertension    Past Surgical History:  Procedure Laterality Date  . APPENDECTOMY  1968  . CATARACT EXTRACTION Left 07/2000   states 2 weeks ago (first of sept) OD due in Dec   . COLONOSCOPY  [id number]   Negative, Dr. Sharlett Iles   . KIDNEY STONE SURGERY  10/2018  . PROSTATE BIOPSY  2006   Dr.Sigmund Tannebaum    reports that he quit smoking about 52 years ago. He has a 5.00 pack-year smoking history. He has never used smokeless tobacco. He reports current alcohol use of about 2.0 standard drinks of alcohol per week. He reports that he does not use drugs. family history includes Breast cancer in his paternal aunt; Coronary artery disease in his maternal grandmother, maternal uncle, mother, and paternal aunt; Diabetes in his maternal aunt and maternal uncle; Hypertension in his mother; Pancreatic cancer in his brother; Stroke in his paternal grandmother and sister; Stroke (age of onset: 81) in his father. No Known  Allergies   Review of Systems  Constitutional: Negative for fatigue.  Eyes: Negative for visual disturbance.  Respiratory: Negative for cough, chest tightness and shortness of breath.   Cardiovascular: Negative for chest pain, palpitations and leg swelling.  Endocrine: Negative for polydipsia and polyuria.  Neurological: Negative for dizziness, syncope, weakness, light-headedness and headaches.       Objective:   Physical Exam Constitutional:      Appearance: He is well-developed.  HENT:     Right Ear: External ear normal.     Left Ear: External ear normal.  Eyes:     Pupils: Pupils are equal, round, and reactive to light.  Neck:     Musculoskeletal: Neck supple.     Thyroid: No thyromegaly.  Cardiovascular:     Rate and Rhythm: Normal rate and regular rhythm.  Pulmonary:     Effort: Pulmonary effort is normal. No respiratory distress.     Breath sounds: Normal breath sounds. No wheezing or rales.  Skin:    Comments: Feet reveal no skin lesions. Good distal foot pulses. Good capillary refill. No calluses. Normal sensation with monofilament testing   Neurological:     Mental Status: He is alert and oriented to person, place, and time.        Assessment:     #1 type 2 diabetes well controlled with A1c 6.4%  #2 hyperlipidemia which is treated with pravastatin  #3 history of elevated blood pressure.  Repeat blood pressure left arm seated after rest 122/82.    Plan:     -Check further labs with basic metabolic panel, lipid panel, hepatic panel, urine micro-albumin -Continue with regular eye exams -Continue regular exercise habits -Routine follow-up in 6 months and sooner as needed  [name] MD [facility] at [facility]

## 2019-07-16 ENCOUNTER — Other Ambulatory Visit: Payer: Self-pay

## 2019-07-16 MED ORDER — METFORMIN HCL ER 500 MG PO TB24: 500.0000 mg | ORAL_TABLET | Freq: Two times a day (BID) | ORAL | 0 refills | Status: DC

## 2019-08-19 ENCOUNTER — Other Ambulatory Visit: Payer: Self-pay | Admitting: Family Medicine

## 2019-10-01 DIAGNOSIS — Z03818 Encounter for observation for suspected exposure to other biological agents ruled out: Secondary | ICD-10-CM | POA: Diagnosis not present

## 2019-10-17 ENCOUNTER — Other Ambulatory Visit: Payer: Self-pay | Admitting: Family Medicine

## 2019-10-27 DIAGNOSIS — R351 Nocturia: Secondary | ICD-10-CM | POA: Diagnosis not present

## 2019-10-27 DIAGNOSIS — N401 Enlarged prostate with lower urinary tract symptoms: Secondary | ICD-10-CM | POA: Diagnosis not present

## 2019-10-27 DIAGNOSIS — N5201 Erectile dysfunction due to arterial insufficiency: Secondary | ICD-10-CM | POA: Diagnosis not present

## 2019-11-26 ENCOUNTER — Ambulatory Visit: Payer: Medicare Other | Attending: Internal Medicine

## 2019-11-26 DIAGNOSIS — Z23 Encounter for immunization: Secondary | ICD-10-CM | POA: Diagnosis not present

## 2019-11-26 NOTE — Progress Notes (Signed)
   Covid-19 Vaccination Clinic  Name:  Robert Warner    MRN: [id number] DOB: 1940/12/18  11/26/2019  [name] was observed post Covid-19 immunization for 15 minutes without incidence. He was provided with Vaccine Information Sheet and instruction to access the V-Safe system.   [name] was instructed to call 911 with any severe reactions post vaccine: Marland Kitchen Difficulty breathing  . Swelling of your face and throat  . A fast heartbeat  . A bad rash all over your body  . Dizziness and weakness    Immunizations Administered    Name Date Dose VIS Date Route   Pfizer COVID-19 Vaccine 11/26/2019 12:39 PM 0.3 mL 10/24/2019 Intramuscular   Manufacturer: [place]   Lot: [id number]   [place]: [id number]

## 2019-12-01 ENCOUNTER — Ambulatory Visit: Payer: Medicare Other

## 2019-12-08 DIAGNOSIS — E119 Type 2 diabetes mellitus without complications: Secondary | ICD-10-CM | POA: Diagnosis not present

## 2019-12-08 DIAGNOSIS — Z961 Presence of intraocular lens: Secondary | ICD-10-CM | POA: Diagnosis not present

## 2019-12-08 DIAGNOSIS — H524 Presbyopia: Secondary | ICD-10-CM | POA: Diagnosis not present

## 2019-12-08 DIAGNOSIS — H401132 Primary open-angle glaucoma, bilateral, moderate stage: Secondary | ICD-10-CM | POA: Diagnosis not present

## 2019-12-08 LAB — HM DIABETES EYE EXAM

## 2019-12-17 ENCOUNTER — Ambulatory Visit: Payer: Medicare Other | Attending: Internal Medicine

## 2019-12-17 DIAGNOSIS — Z23 Encounter for immunization: Secondary | ICD-10-CM

## 2019-12-17 NOTE — Progress Notes (Signed)
   Covid-19 Vaccination Clinic  Name:  Robert Warner    MRN: [id number] DOB: 05-Jun-1941  12/17/2019  [name] was observed post Covid-19 immunization for 15 minutes without incidence. He was provided with Vaccine Information Sheet and instruction to access the V-Safe system.   [name] was instructed to call 911 with any severe reactions post vaccine: Marland Kitchen Difficulty breathing  . Swelling of your face and throat  . A fast heartbeat  . A bad rash all over your body  . Dizziness and weakness    Immunizations Administered    Name Date Dose VIS Date Route   Pfizer COVID-19 Vaccine 12/17/2019  8:12 AM 0.3 mL 10/24/2019 Intramuscular   Manufacturer: [place]   Lot: [id number]   [place]: [id number]

## 2020-01-07 ENCOUNTER — Other Ambulatory Visit: Payer: Self-pay

## 2020-01-07 ENCOUNTER — Telehealth (INDEPENDENT_AMBULATORY_CARE_PROVIDER_SITE_OTHER): Payer: Medicare Other | Admitting: Family Medicine

## 2020-01-07 DIAGNOSIS — E119 Type 2 diabetes mellitus without complications: Secondary | ICD-10-CM | POA: Diagnosis not present

## 2020-01-07 DIAGNOSIS — E785 Hyperlipidemia, unspecified: Secondary | ICD-10-CM

## 2020-01-07 DIAGNOSIS — R209 Unspecified disturbances of skin sensation: Secondary | ICD-10-CM

## 2020-01-07 MED ORDER — METFORMIN HCL ER 750 MG PO TB24: 750.0000 mg | ORAL_TABLET | Freq: Every day | ORAL | 3 refills | Status: DC

## 2020-01-07 NOTE — Progress Notes (Signed)
This visit type was conducted due to national recommendations for restrictions regarding the COVID-19 pandemic in an effort to limit this patient's exposure and mitigate transmission in our community.   Virtual Visit via Video Note  I connected with Robert Warner on 01/07/20 at  7:00 AM EST by a video enabled telemedicine application and verified that I am speaking with the correct person using two identifiers.  Location patient: home Location provider:work or home office Persons participating in the virtual visit: patient, provider  I discussed the limitations of evaluation and management by telemedicine and the availability of in person appointments. The patient expressed understanding and agreed to proceed.   HPI:  [name] is seen for virtual follow-up today.  He has type 2 diabetes and hyperlipidemia.  He was seen back in August and A1c was 6.4%.  He monitors his blood sugar several times weekly and generally ranging 90s to 120.  His only concern is that he states that at night he has some mild dyspepsia which she thinks may be related to his Metformin.  He will extended release 500 mg twice daily.  He also has complained that his feet feel "cold "at night.  He is not describing any generalized cold intolerance.  No claudication type symptoms.  No intolerance with walking or exercise.  He had recent eye exam reportedly normal.  Past Medical History:  Diagnosis Date  . Arthritis   . Diabetes mellitus   . Hyperlipidemia   . Hypertension    Past Surgical History:  Procedure Laterality Date  . APPENDECTOMY  1968  . CATARACT EXTRACTION Left 07/2000   states 2 weeks ago (first of sept) OD due in Dec   . COLONOSCOPY  [id number]   Negative, Dr. Sharlett Iles   . KIDNEY STONE SURGERY  10/2018  . PROSTATE BIOPSY  2006   Dr.Sigmund Tannebaum    reports that he quit smoking about 53 years ago. He has a 5.00 pack-year smoking history. He has never used smokeless tobacco. He reports current  alcohol use of about 2.0 standard drinks of alcohol per week. He reports that he does not use drugs. family history includes Breast cancer in his paternal aunt; Coronary artery disease in his maternal grandmother, maternal uncle, mother, and paternal aunt; Diabetes in his maternal aunt and maternal uncle; Hypertension in his mother; Pancreatic cancer in his brother; Stroke in his paternal grandmother and sister; Stroke (age of onset: 40) in his father. No Known Allergies    ROS: See pertinent positives and negatives per HPI.  Past Medical History:  Diagnosis Date  . Arthritis   . Diabetes mellitus   . Hyperlipidemia   . Hypertension     Past Surgical History:  Procedure Laterality Date  . APPENDECTOMY  1968  . CATARACT EXTRACTION Left 07/2000   states 2 weeks ago (first of sept) OD due in Dec   . COLONOSCOPY  [id number]   Negative, Dr. Sharlett Iles   . KIDNEY STONE SURGERY  10/2018  . PROSTATE BIOPSY  2006   Dr.Sigmund Tannebaum    Family History  Problem Relation Age of Onset  . Stroke Father 37  . Hypertension Mother   . Coronary artery disease Mother   . Diabetes Maternal Aunt   . Pancreatic cancer Brother   . Stroke Paternal Grandmother   . Breast cancer Paternal Aunt   . Coronary artery disease Paternal Aunt   . Coronary artery disease Maternal Grandmother   . Coronary artery disease Maternal Uncle  2 Maternal Uncles   . Diabetes Maternal Uncle   . Stroke Sister   . Heart disease Neg Hx     SOCIAL HX: Non-smoker   Current Outpatient Medications:  .  aspirin 81 MG tablet, Take 81 mg by mouth daily., Disp: , Rfl:  .  Cholecalciferol (VITAMIN D3) 2000 UNITS TABS, Take by mouth daily., Disp: , Rfl:  .  glucose blood (FREESTYLE LITE) test strip, CHECK BLOOD SUGAR ONCE DAILY.  Dx:E11.9, Disp: 100 each, Rfl: 3 .  JANUVIA 100 MG tablet, TAKE 1 TABLET DAILY, Disp: 90 tablet, Rfl: 3 .  Lancets (FREESTYLE) lancets, Check blood sugar once daily, Disp: 100 each, Rfl:  3 .  pravastatin (PRAVACHOL) 20 MG tablet, TAKE 1 TABLET DAILY, Disp: 90 tablet, Rfl: 3 .  TRAVATAN Z 0.004 % SOLN ophthalmic solution, , Disp: , Rfl:  .  metFORMIN (GLUCOPHAGE XR) 750 MG 24 hr tablet, Take 1 tablet (750 mg total) by mouth daily with breakfast., Disp: 90 tablet, Rfl: 3  EXAM:  VITALS per patient if applicable:  GENERAL: alert, oriented, appears well and in no acute distress  HEENT: atraumatic, conjunttiva clear, no obvious abnormalities on inspection of external nose and ears  NECK: normal movements of the head and neck  LUNGS: on inspection no signs of respiratory distress, breathing rate appears normal, no obvious gross SOB, gasping or wheezing  CV: no obvious cyanosis  MS: moves all visible extremities without noticeable abnormality  PSYCH/NEURO: pleasant and cooperative, no obvious depression or anxiety, speech and thought processing grossly intact  ASSESSMENT AND PLAN:  Discussed the following assessment and plan:  #1 controlled type 2 diabetes.  This remains well controlled by home readings.  He is having some nighttime dyspepsia symptoms  -We discussed switching him from Metformin extended release 500 twice daily to 750 mg extended release once daily in the morning and follow-up in 6 months and reassess A1c then  #2 dyslipidemia which is treated with pravastatin.  Recent lipids were reviewed and at goal  -Recheck lipids and hepatic panel at follow-up  #3 symptoms of cold hands and feet.  Explained this is fairly nonspecific.  Consider TSH at follow-up though     I discussed the assessment and treatment plan with the patient. The patient was provided an opportunity to ask questions and all were answered. The patient agreed with the plan and demonstrated an understanding of the instructions.   The patient was advised to call back or seek an in-person evaluation if the symptoms worsen or if the condition fails to improve as anticipated.     [name], MD

## 2020-06-09 DIAGNOSIS — H401132 Primary open-angle glaucoma, bilateral, moderate stage: Secondary | ICD-10-CM | POA: Diagnosis not present

## 2020-06-15 ENCOUNTER — Ambulatory Visit: Payer: Medicare Other

## 2020-06-30 ENCOUNTER — Ambulatory Visit (INDEPENDENT_AMBULATORY_CARE_PROVIDER_SITE_OTHER): Payer: Medicare Other

## 2020-06-30 ENCOUNTER — Other Ambulatory Visit: Payer: Self-pay

## 2020-06-30 DIAGNOSIS — Z0000 Encounter for general adult medical examination without abnormal findings: Secondary | ICD-10-CM

## 2020-06-30 NOTE — Patient Instructions (Signed)
Robert Warner , Thank you for taking time to come for your Medicare Wellness Visit. I appreciate your ongoing commitment to your health goals. Please review the following plan we discussed and let me know if I can assist you in the future.   Screening recommendations/referrals: Colonoscopy:  No longer required  Recommended yearly ophthalmology/optometry visit for glaucoma screening and checkup Recommended yearly dental visit for hygiene and checkup  Vaccinations: Influenza vaccine: Up to date, next due this fall 2021  Pneumococcal vaccine: completed series Tdap vaccine: Currently due, please contact your insurance to discuss cost or you may await to receive once injury occurs  Shingles vaccine: Currently due for shingrix, please discuss this cost with your pharmacy and receive vaccine at the pharmacy     Advanced directives: Please bring a copy of your advanced directives to your next appointment so that we may scan them into your chart.   Conditions/risks identified: None   Next appointment: None  Preventive Care 65 Years and Older, Male Preventive care refers to lifestyle choices and visits with your health care provider that can promote health and wellness. What does preventive care include?  A yearly physical exam. This is also called an annual well check.  Dental exams once or twice a year.  Routine eye exams. Ask your health care provider how often you should have your eyes checked.  Personal lifestyle choices, including:  Daily care of your teeth and gums.  Regular physical activity.  Eating a healthy diet.  Avoiding tobacco and drug use.  Limiting alcohol use.  Practicing safe sex.  Taking low doses of aspirin every day.  Taking vitamin and mineral supplements as recommended by your health care provider. What happens during an annual well check? The services and screenings done by your health care provider during your annual well check will depend on your age,  overall health, lifestyle risk factors, and family history of disease. Counseling  Your health care provider may ask you questions about your:  Alcohol use.  Tobacco use.  Drug use.  Emotional well-being.  Home and relationship well-being.  Sexual activity.  Eating habits.  History of falls.  Memory and ability to understand (cognition).  Work and work Statistician. Screening  You may have the following tests or measurements:  Height, weight, and BMI.  Blood pressure.  Lipid and cholesterol levels. These may be checked every 5 years, or more frequently if you are over 3 years old.  Skin check.  Lung cancer screening. You may have this screening every year starting at age 42 if you have a 30-pack-year history of smoking and currently smoke or have quit within the past 15 years.  Fecal occult blood test (FOBT) of the stool. You may have this test every year starting at age 2.  Flexible sigmoidoscopy or colonoscopy. You may have a sigmoidoscopy every 5 years or a colonoscopy every 10 years starting at age 1.  Prostate cancer screening. Recommendations will vary depending on your family history and other risks.  Hepatitis C blood test.  Hepatitis B blood test.  Sexually transmitted disease (STD) testing.  Diabetes screening. This is done by checking your blood sugar (glucose) after you have not eaten for a while (fasting). You may have this done every 1-3 years.  Abdominal aortic aneurysm (AAA) screening. You may need this if you are a current or former smoker.  Osteoporosis. You may be screened starting at age 73 if you are at high risk. Talk with your health care provider  about your test results, treatment options, and if necessary, the need for more tests. Vaccines  Your health care provider may recommend certain vaccines, such as:  Influenza vaccine. This is recommended every year.  Tetanus, diphtheria, and acellular pertussis (Tdap, Td) vaccine. You may  need a Td booster every 10 years.  Zoster vaccine. You may need this after age 1.  Pneumococcal 13-valent conjugate (PCV13) vaccine. One dose is recommended after age 14.  Pneumococcal polysaccharide (PPSV23) vaccine. One dose is recommended after age 13. Talk to your health care provider about which screenings and vaccines you need and how often you need them. This information is not intended to replace advice given to you by your health care provider. Make sure you discuss any questions you have with your health care provider. Document Released: 11/26/2015 Document Revised: 07/19/2016 Document Reviewed: 08/31/2015 Elsevier Interactive Patient Education  2017 [employer] Prevention in the Home Falls can cause injuries. They can happen to people of all ages. There are many things you can do to make your home safe and to help prevent falls. What can I do on the outside of my home?  Regularly fix the edges of walkways and driveways and fix any cracks.  Remove anything that might make you trip as you walk through a door, such as a raised step or threshold.  Trim any bushes or trees on the path to your home.  Use bright outdoor lighting.  Clear any walking paths of anything that might make someone trip, such as rocks or tools.  Regularly check to see if handrails are loose or broken. Make sure that both sides of any steps have handrails.  Any raised decks and porches should have guardrails on the edges.  Have any leaves, snow, or ice cleared regularly.  Use sand or salt on walking paths during winter.  Clean up any spills in your garage right away. This includes oil or grease spills. What can I do in the bathroom?  Use night lights.  Install grab bars by the toilet and in the tub and shower. Do not use towel bars as grab bars.  Use non-skid mats or decals in the tub or shower.  If you need to sit down in the shower, use a plastic, non-slip stool.  Keep the floor  dry. Clean up any water that spills on the floor as soon as it happens.  Remove soap buildup in the tub or shower regularly.  Attach bath mats securely with double-sided non-slip rug tape.  Do not have throw rugs and other things on the floor that can make you trip. What can I do in the bedroom?  Use night lights.  Make sure that you have a light by your bed that is easy to reach.  Do not use any sheets or blankets that are too big for your bed. They should not hang down onto the floor.  Have a firm chair that has side arms. You can use this for support while you get dressed.  Do not have throw rugs and other things on the floor that can make you trip. What can I do in the kitchen?  Clean up any spills right away.  Avoid walking on wet floors.  Keep items that you use a lot in easy-to-reach places.  If you need to reach something above you, use a strong step stool that has a grab bar.  Keep electrical cords out of the way.  Do not use floor polish or  wax that makes floors slippery. If you must use wax, use non-skid floor wax.  Do not have throw rugs and other things on the floor that can make you trip. What can I do with my stairs?  Do not leave any items on the stairs.  Make sure that there are handrails on both sides of the stairs and use them. Fix handrails that are broken or loose. Make sure that handrails are as long as the stairways.  Check any carpeting to make sure that it is firmly attached to the stairs. Fix any carpet that is loose or worn.  Avoid having throw rugs at the top or bottom of the stairs. If you do have throw rugs, attach them to the floor with carpet tape.  Make sure that you have a light switch at the top of the stairs and the bottom of the stairs. If you do not have them, ask someone to add them for you. What else can I do to help prevent falls?  Wear shoes that:  Do not have high heels.  Have rubber bottoms.  Are comfortable and fit you  well.  Are closed at the toe. Do not wear sandals.  If you use a stepladder:  Make sure that it is fully opened. Do not climb a closed stepladder.  Make sure that both sides of the stepladder are locked into place.  Ask someone to hold it for you, if possible.  Clearly mark and make sure that you can see:  Any grab bars or handrails.  First and last steps.  Where the edge of each step is.  Use tools that help you move around (mobility aids) if they are needed. These include:  Canes.  Walkers.  Scooters.  Crutches.  Turn on the lights when you go into a dark area. Replace any light bulbs as soon as they burn out.  Set up your furniture so you have a clear path. Avoid moving your furniture around.  If any of your floors are uneven, fix them.  If there are any pets around you, be aware of where they are.  Review your medicines with your doctor. Some medicines can make you feel dizzy. This can increase your chance of falling. Ask your doctor what other things that you can do to help prevent falls. This information is not intended to replace advice given to you by your health care provider. Make sure you discuss any questions you have with your health care provider. Document Released: 08/26/2009 Document Revised: 04/06/2016 Document Reviewed: 12/04/2014 Elsevier Interactive Patient Education  2017 [employer].

## 2020-06-30 NOTE — Progress Notes (Signed)
Subjective:   Robert Warner is a 79 y.o. male who presents for Medicare Annual/Subsequent preventive examination.  I connected with [name]  today by telephone and verified that I am speaking with the correct person using two identifiers. Location patient: home Location provider: work Persons participating in the virtual visit: patient, provider.   I discussed the limitations, risks, security and privacy concerns of performing an evaluation and management service by telephone and the availability of in person appointments. I also discussed with the patient that there may be a patient responsible charge related to this service. The patient expressed understanding and verbally consented to this telephonic visit.    Interactive audio and video telecommunications were attempted between this provider and patient, however failed, due to patient having technical difficulties OR patient did not have access to video capability.  We continued and completed visit with audio only.      Review of Systems    N/A  Cardiac Risk Factors include: advanced age (>17men, >43 women);diabetes mellitus;male gender;dyslipidemia     Objective:    Today's Vitals   There is no height or weight on file to calculate BMI.  Advanced Directives 06/30/2020 11/25/2018 08/01/2017  Does Patient Have a Medical Advance Directive? Yes Yes Yes  Type of Paramedic of [place];Living will [place];Living will -  Does patient want to make changes to medical advance directive? No - Patient declined No - Patient declined -  Copy of [place] in Chart? No - copy requested No - copy requested -    Current Medications (verified) Outpatient Encounter Medications as of 06/30/2020  Medication Sig  . aspirin 81 MG tablet Take 81 mg by mouth daily.  . Cholecalciferol (VITAMIN D3) 2000 UNITS TABS Take by mouth daily.  Marland Kitchen glucose blood (FREESTYLE LITE) test strip  CHECK BLOOD SUGAR ONCE DAILY.  Dx:E11.9  . JANUVIA 100 MG tablet TAKE 1 TABLET DAILY  . Lancets (FREESTYLE) lancets Check blood sugar once daily  . metFORMIN (GLUCOPHAGE XR) 750 MG 24 hr tablet Take 1 tablet (750 mg total) by mouth daily with breakfast.  . pravastatin (PRAVACHOL) 20 MG tablet TAKE 1 TABLET DAILY  . TRAVATAN Z 0.004 % SOLN ophthalmic solution   . sildenafil (VIAGRA) 50 MG tablet Take by mouth. (Patient not taking: Reported on 06/30/2020)   No facility-administered encounter medications on file as of 06/30/2020.    Allergies (verified) Patient has no known allergies.   History: Past Medical History:  Diagnosis Date  . Arthritis   . Diabetes mellitus   . Hyperlipidemia   . Hypertension    Past Surgical History:  Procedure Laterality Date  . APPENDECTOMY  1968  . CATARACT EXTRACTION Left 07/2000   states 2 weeks ago (first of sept) OD due in Dec   . COLONOSCOPY  4098,1191   Negative, Dr. Sharlett Iles   . KIDNEY STONE SURGERY  10/2018  . PROSTATE BIOPSY  2006   Dr.Sigmund Tannebaum   Family History  Problem Relation Age of Onset  . Stroke Father 36  . Hypertension Mother   . Coronary artery disease Mother   . Diabetes Maternal Aunt   . Pancreatic cancer Brother   . Stroke Paternal Grandmother   . Breast cancer Paternal Aunt   . Coronary artery disease Paternal Aunt   . Coronary artery disease Maternal Grandmother   . Coronary artery disease Maternal Uncle        2 Maternal Uncles   .  Diabetes Maternal Uncle   . Stroke Sister   . Heart disease Neg Hx    Social History   Socioeconomic History  . Marital status: Married    Spouse name: Not on file  . Number of children: 2  . Years of education: Not on file  . Highest education level: Not on file  Occupational History  . Occupation: retired Airline pilot  Tobacco Use  . Smoking status: Former Smoker    Packs/day: 0.50    Years: 10.00    Pack years: 5.00    Quit date: 11/13/1966    Years since quitting:  53.6  . Smokeless tobacco: Never Used  Vaping Use  . Vaping Use: Never used  Substance and Sexual Activity  . Alcohol use: Yes    Alcohol/week: 2.0 standard drinks    Types: 2 Glasses of wine per week    Comment: Occasional wine or beer  . Drug use: No  . Sexual activity: Not on file  Other Topics Concern  . Not on file  Social History Narrative  . Not on file   Social Determinants of Health   Financial Resource Strain: Low Risk   . Difficulty of Paying Living Expenses: Not hard at all  Food Insecurity: No Food Insecurity  . Worried About Charity fundraiser in the Last Year: Never true  . Ran Out of Food in the Last Year: Never true  Transportation Needs: No Transportation Needs  . Lack of Transportation (Medical): No  . Lack of Transportation (Non-Medical): No  Physical Activity: Sufficiently Active  . Days of Exercise per Week: 5 days  . Minutes of Exercise per Session: 60 min  Stress: No Stress Concern Present  . Feeling of Stress : Not at all  Social Connections: Socially Integrated  . Frequency of Communication with Friends and Family: More than three times a week  . Frequency of Social Gatherings with Friends and Family: More than three times a week  . Attends Religious Services: More than 4 times per year  . Active Member of Clubs or Organizations: Yes  . Attends Archivist Meetings: More than 4 times per year  . Marital Status: Married    Tobacco Counseling Counseling given: Not Answered   Clinical Intake:  Pre-visit preparation completed: Yes  Pain : No/denies pain     Nutritional Risks: None Diabetes: Yes (Patient states checks blood sugars once a week) CBG done?: No Did pt. bring in CBG monitor from home?: No  How often do you need to have someone help you when you read instructions, pamphlets, or other written materials from your doctor or pharmacy?: 1 - Never What is the last grade level you completed in school?:  Doctorate  Diabetic?yes  Interpreter Needed?: No  Information entered by :: [place] of Daily Living In your present state of health, do you have any difficulty performing the following activities: 06/30/2020  Hearing? Y  Comment Has hearing aids but they are no longer working  Vision? N  Difficulty concentrating or making decisions? Y  Walking or climbing stairs? N  Dressing or bathing? N  Doing errands, shopping? N  Preparing Food and eating ? N  Using the Toilet? N  In the past six months, have you accidently leaked urine? N  Do you have problems with loss of bowel control? N  Managing your Medications? N  Managing your Finances? N  Housekeeping or managing your Housekeeping? N  Some recent data might be hidden  Patient Care Team: [name], MD as PCP - General (Family Medicine) [name], MD as Consulting Physician (Ophthalmology) Rosemary Holms, DPM as Consulting Physician (Podiatry) Minor, Orlie Pollen, DDS (Dentistry) Armbruster, Carlota Raspberry, MD as Consulting Physician (Gastroenterology) Festus Aloe, MD as Consulting Physician (Urology)  Indicate any recent Medical Services you may have received from other than [facility] providers in the past year (date may be approximate).     Assessment:   This is a routine wellness examination for [place].  Hearing/Vision screen  Hearing Screening   125Hz  250Hz  500Hz  1000Hz  2000Hz  3000Hz  4000Hz  6000Hz  8000Hz   Right ear:           Left ear:           Vision Screening Comments: Patient states gets eyes checked every 6 months    Dietary issues and exercise activities discussed:    Goals    . Patient Stated     Continue to manage weight, with goal of 195lbs, establish exercise routine    . Weight (lb) < 200 lb (90.7 kg)     Weight to 195lb Exercise; 2 miles walk; run, sit ups; push ups  60 to 75         Depression Screen PHQ 2/9 Scores 06/30/2020 11/25/2018 11/25/2018 08/01/2017  06/07/2015 10/28/2014 04/29/2014  PHQ - 2 Score 0 0 0 0 0 0 0  PHQ- 9 Score 0 2 - - - - -    Fall Risk Fall Risk  06/30/2020 11/25/2018 11/25/2018 08/01/2017 07/14/2016  Falls in the past year? 0 0 0 No No  Comment - - - - Emmi Telephone Survey: data to providers prior to load  Number falls in past yr: 0 - - - -  Injury with Fall? 0 - - - -  Risk for fall due to : No Fall Risks - - - -  Follow up Falls evaluation completed;Falls prevention discussed - - - -    Any stairs in or around the home? Yes  If so, are there any without handrails? No  Home free of loose throw rugs in walkways, pet beds, electrical cords, etc? Yes  Adequate lighting in your home to reduce risk of falls? Yes   ASSISTIVE DEVICES UTILIZED TO PREVENT FALLS:  Life alert? No  Use of a cane, walker or w/c? No  Grab bars in the bathroom? Yes  Shower chair or bench in shower? Yes  Elevated toilet seat or a handicapped toilet? Yes     Cognitive Function:     6CIT Screen 06/30/2020  What Year? 0 points  What month? 0 points  What time? 0 points  Count back from 20 0 points  Months in reverse 0 points  Repeat phrase 0 points  Total Score 0    Immunizations Immunization History  Administered Date(s) Administered  . Fluad Quad(high Dose 65+) 07/07/2019  . Influenza Whole 08/02/2009, 08/13/2012  . Influenza, High Dose Seasonal PF 08/04/2013, 07/14/2016, 08/01/2017  . Influenza,inj,Quad PF,6+ Mos 07/31/2014  . PFIZER SARS-COV-2 Vaccination 11/26/2019, 12/17/2019  . Pneumococcal Conjugate-13 06/07/2015  . Pneumococcal Polysaccharide-23 10/15/2013  . Td 06/15/2008  . Zoster 07/31/2014    TDAP status: Due, Education has been provided regarding the importance of this vaccine. Advised may receive this vaccine at local pharmacy or Health Dept. Aware to provide a copy of the vaccination record if obtained from local pharmacy or Health Dept. Verbalized acceptance and understanding. Flu Vaccine status: Up to  date Pneumococcal vaccine status: Up to date Covid-19  vaccine status: Completed vaccines  Qualifies for Shingles Vaccine? Yes   Zostavax completed Yes   Shingrix Completed?: No.    Education has been provided regarding the importance of this vaccine. Patient has been advised to call insurance company to determine out of pocket expense if they have not yet received this vaccine. Advised may also receive vaccine at local pharmacy or Health Dept. Verbalized acceptance and understanding.  Screening Tests Health Maintenance  Topic Date Due  . TETANUS/TDAP  06/15/2018  . HEMOGLOBIN A1C  01/07/2020  . INFLUENZA VACCINE  06/13/2020  . URINE MICROALBUMIN  07/06/2020  . FOOT EXAM  07/06/2020  . OPHTHALMOLOGY EXAM  12/07/2020  . COVID-19 Vaccine  Completed  . PNA vac Low Risk Adult  Completed    Health Maintenance  Health Maintenance Due  Topic Date Due  . TETANUS/TDAP  06/15/2018  . HEMOGLOBIN A1C  01/07/2020  . INFLUENZA VACCINE  06/13/2020  . URINE MICROALBUMIN  07/06/2020    Colorectal cancer screening: No longer required.   Lung Cancer Screening: (Low Dose CT Chest recommended if Age 64-80 years, 30 pack-year currently smoking OR have quit w/in 15years.) does not qualify.   Lung Cancer Screening Referral: N/A   Additional Screening:  Hepatitis C Screening: does not qualify;   Vision Screening: Recommended annual ophthalmology exams for early detection of glaucoma and other disorders of the eye. Is the patient up to date with their annual eye exam?  Yes  Who is the provider or what is the name of the office in which the patient attends annual eye exams? Dr. Satira Sark  If pt is not established with a provider, would they like to be referred to a provider to establish care? No .   Dental Screening: Recommended annual dental exams for proper oral hygiene  Community Resource Referral / Chronic Care Management: CRR required this visit?  No   CCM required this visit?  No       Plan:     I have personally reviewed and noted the following in the patient's chart:   . Medical and social history . Use of alcohol, tobacco or illicit drugs  . Current medications and supplements . Functional ability and status . Nutritional status . Physical activity . Advanced directives . List of other physicians . Hospitalizations, surgeries, and ER visits in previous 12 months . Vitals . Screenings to include cognitive, depression, and falls . Referrals and appointments  In addition, I have reviewed and discussed with patient certain preventive protocols, quality metrics, and best practice recommendations. A written personalized care plan for preventive services as well as general preventive health recommendations were provided to patient.     Ofilia Neas, LPN   5/63/1497   Nurse Notes: None

## 2020-08-04 ENCOUNTER — Other Ambulatory Visit: Payer: Self-pay | Admitting: Family Medicine

## 2020-08-04 MED ORDER — FREESTYLE LITE TEST VI STRP: ORAL_STRIP | 3 refills | Status: DC

## 2020-08-04 NOTE — Telephone Encounter (Signed)
Pt call and need a new RX for his test strips sent to  [facility], [place] Phone:  [phone]  Fax:  [phone]

## 2020-08-04 NOTE — Telephone Encounter (Signed)
Rx sent in

## 2020-08-14 ENCOUNTER — Other Ambulatory Visit: Payer: Self-pay | Admitting: Family Medicine

## 2020-08-17 ENCOUNTER — Ambulatory Visit: Payer: Medicare Other | Attending: Internal Medicine

## 2020-08-17 DIAGNOSIS — Z23 Encounter for immunization: Secondary | ICD-10-CM

## 2020-08-17 NOTE — Progress Notes (Signed)
   Covid-19 Vaccination Clinic  Name:  Robert Warner    MRN: [id number] DOB: 13-Oct-1941  08/17/2020  [name] was observed post Covid-19 immunization for 15 minutes without incident. He was provided with Vaccine Information Sheet and instruction to access the V-Safe system.   [name] was instructed to call 911 with any severe reactions post vaccine: Marland Kitchen Difficulty breathing  . Swelling of face and throat  . A fast heartbeat  . A bad rash all over body  . Dizziness and weakness

## 2020-08-25 ENCOUNTER — Ambulatory Visit: Payer: Medicare Other

## 2020-08-25 ENCOUNTER — Other Ambulatory Visit: Payer: Self-pay

## 2020-08-25 DIAGNOSIS — Z23 Encounter for immunization: Secondary | ICD-10-CM

## 2020-09-08 DIAGNOSIS — M792 Neuralgia and neuritis, unspecified: Secondary | ICD-10-CM | POA: Diagnosis not present

## 2020-09-08 DIAGNOSIS — L600 Ingrowing nail: Secondary | ICD-10-CM | POA: Diagnosis not present

## 2020-09-08 DIAGNOSIS — L602 Onychogryphosis: Secondary | ICD-10-CM | POA: Diagnosis not present

## 2020-09-08 DIAGNOSIS — I739 Peripheral vascular disease, unspecified: Secondary | ICD-10-CM | POA: Diagnosis not present

## 2020-09-08 DIAGNOSIS — M79671 Pain in right foot: Secondary | ICD-10-CM | POA: Diagnosis not present

## 2020-09-08 DIAGNOSIS — B351 Tinea unguium: Secondary | ICD-10-CM | POA: Diagnosis not present

## 2020-09-15 DIAGNOSIS — B351 Tinea unguium: Secondary | ICD-10-CM | POA: Diagnosis not present

## 2020-09-22 DIAGNOSIS — E1351 Other specified diabetes mellitus with diabetic peripheral angiopathy without gangrene: Secondary | ICD-10-CM | POA: Diagnosis not present

## 2020-09-22 DIAGNOSIS — L03031 Cellulitis of right toe: Secondary | ICD-10-CM | POA: Diagnosis not present

## 2020-09-22 DIAGNOSIS — M79671 Pain in right foot: Secondary | ICD-10-CM | POA: Diagnosis not present

## 2020-12-01 ENCOUNTER — Telehealth: Payer: Self-pay | Admitting: Family Medicine

## 2020-12-01 MED ORDER — METFORMIN HCL ER 750 MG PO TB24: 750.0000 mg | ORAL_TABLET | Freq: Every day | ORAL | 0 refills | Status: DC

## 2020-12-01 MED ORDER — PRAVASTATIN SODIUM 20 MG PO TABS: 20.0000 mg | ORAL_TABLET | Freq: Every day | ORAL | 0 refills | Status: DC

## 2020-12-01 MED ORDER — SITAGLIPTIN PHOSPHATE 100 MG PO TABS: 100.0000 mg | ORAL_TABLET | Freq: Every day | ORAL | 0 refills | Status: DC

## 2020-12-01 NOTE — Telephone Encounter (Signed)
Refills sent.  Pharmacy updated.

## 2020-12-01 NOTE — Telephone Encounter (Signed)
Patient needs refills on the following medications:  Januvia Tabs 100 mgs Parastatin Tabs 20 mg Metformin HCL ER Tabs 750 mg   **Patient wants [facility] taken off of his chart.   Send all medications to [facility]-- [id number]

## 2020-12-08 ENCOUNTER — Encounter: Payer: Self-pay | Admitting: Family Medicine

## 2020-12-08 DIAGNOSIS — H524 Presbyopia: Secondary | ICD-10-CM | POA: Diagnosis not present

## 2020-12-08 DIAGNOSIS — E119 Type 2 diabetes mellitus without complications: Secondary | ICD-10-CM | POA: Diagnosis not present

## 2020-12-08 DIAGNOSIS — H04123 Dry eye syndrome of bilateral lacrimal glands: Secondary | ICD-10-CM | POA: Diagnosis not present

## 2020-12-08 DIAGNOSIS — H401132 Primary open-angle glaucoma, bilateral, moderate stage: Secondary | ICD-10-CM | POA: Diagnosis not present

## 2020-12-17 ENCOUNTER — Other Ambulatory Visit: Payer: Medicare HMO

## 2020-12-17 DIAGNOSIS — Z20822 Contact with and (suspected) exposure to covid-19: Secondary | ICD-10-CM

## 2020-12-18 LAB — NOVEL CORONAVIRUS, NAA: SARS-CoV-2, NAA: DETECTED — AB

## 2020-12-18 LAB — SARS-COV-2, NAA 2 DAY TAT

## 2020-12-19 ENCOUNTER — Telehealth (HOSPITAL_COMMUNITY): Payer: Self-pay | Admitting: Family

## 2020-12-19 ENCOUNTER — Telehealth: Payer: Self-pay | Admitting: Nurse Practitioner

## 2020-12-19 NOTE — Telephone Encounter (Addendum)
Called to discuss with patient about Covid symptoms and the use of a monoclonal antibody infusion for those with mild to moderate Covid symptoms and at a high risk of hospitalization.   Pt is qualified for the antiviral infusion, but due to medication shortages we cannot offer the pt the infusion at this time as his symptoms began on "Sunday" or "the 31st, but maybe it wasn't the 31st". Both dates are outside of the <or = 7 days to first available therapy date which is tomorrow. He also states he didn't really have any bad symptoms. He is his Warehouse manager, and a retired Animal nutritionist. He states he is familiar with monoclonal antibodies. He states though he has mild symptoms and is feeling pretty well now, he is worried about the future. Offered for him to speak with medical director regarding his concerns and he declined, and voiced understanding. Current treatment guidelines are based on the NIH Covid 19 treatment guidelines for treatment prioritization and equitable access. Symptoms tier reviewed as well as criteria for ending isolation.   Robert Gowda, NP  12/19/2020 2:12 PM

## 2020-12-19 NOTE — Telephone Encounter (Signed)
Called to Discuss with patient about Covid symptoms and the use of the monoclonal antibody infusion for those with mild to moderate Covid symptoms and at a high risk of hospitalization.     Pt appears to qualify for this infusion due to co-morbid conditions and/or a member of an at-risk group in accordance with the FDA Emergency Use Authorization.    Unable to reach pt. Voicemail left and My Chart message sent.   Symptom onset: Unsure, will need to speak further to patient to determine Vaccinated: Yes Therapist, music and boosted) Qualified for Infusion: Yes (CAD, dm, bmi)  Alda Lea, NP [employer]  [phone]

## 2020-12-22 ENCOUNTER — Encounter: Payer: Self-pay | Admitting: Family Medicine

## 2020-12-22 ENCOUNTER — Telehealth (INDEPENDENT_AMBULATORY_CARE_PROVIDER_SITE_OTHER): Payer: Medicare HMO | Admitting: Family Medicine

## 2020-12-22 DIAGNOSIS — U071 COVID-19: Secondary | ICD-10-CM | POA: Diagnosis not present

## 2020-12-22 DIAGNOSIS — E119 Type 2 diabetes mellitus without complications: Secondary | ICD-10-CM

## 2020-12-22 NOTE — Progress Notes (Signed)
Patient ID: Robert Warner, male   DOB: [birth year], 80 y.o.   MRN: [id number]  This visit type was conducted due to national recommendations for restrictions regarding the COVID-19 pandemic in an effort to limit this patient's exposure and mitigate transmission in our community.   Virtual Visit via Video Note  I connected with Robert Warner on 12/22/20 at  4:45 PM EST by a video enabled telemedicine application and verified that I am speaking with the correct person using two identifiers.  Location patient: home Location provider:work or home office Persons participating in the virtual visit: patient, provider  I discussed the limitations of evaluation and management by telemedicine and the availability of in person appointments. The patient expressed understanding and agreed to proceed.   HPI:  Robert Warner called with recent positive Covid test about 5 days ago.  He states he basically had almost no symptoms but was tested predominantly because he had positive exposure to someone at church.  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  He did have some very mild symptoms of what he thought was a "cold "about 7 days ago.  His symptoms were very mild and include some nasal congestion.  He had minimal dry cough over the weekend.  Has never had any fever.  No body aches.  No nausea or vomiting.  No cough at this time.  He went for Covid test on the fourth and this came back positive on the fifth.  He has already been contacted by someone with [facility] regarding possible treatment options.  Because his symptoms were so mild he decided not to pursue any further treatments.  He feels very stable at this time.  Pulse oximetry has been normal.  He had questions today mostly regarding whether he should get retested and about quarantine.  He is staying very isolated from his wife.  She has no symptoms.  He has type 2 diabetes.  We  have not gotten A1c in over a year.  He states he has had some recent increase in blood sugars from usual highs around low 100s for fasting and recently around 150.  Currently takes Metformin extended release 750 mg daily and Januvia 100 mg daily.  He states his blood sugars have bumped up really now for the past couple of months and well before he had Covid infection.  He has gotten care through the [place] [facility] in the past but has not seen them recently.  He relates that he is not been exercising as much with the recent uptake in Covid cases.  He had been going to the gym and working out more regularly before this.  He thinks that may be attributed to his recent increase in blood sugars.   ROS: See pertinent positives and negatives per HPI.  Past Medical History:  Diagnosis Date  . Arthritis   . Diabetes mellitus   . Hyperlipidemia   . Hypertension     Past Surgical History:  Procedure Laterality Date  . APPENDECTOMY  1968  . CATARACT EXTRACTION Left 07/2000   states 2 weeks ago (first of sept) OD due in Dec   . COLONOSCOPY  [id number]   Negative, Dr. Sharlett Iles   . KIDNEY STONE SURGERY  10/2018  . PROSTATE BIOPSY  2006   Dr.Sigmund Tannebaum    Family History  Problem Relation Age of Onset  . Stroke Father 35  . Hypertension Mother   .  Coronary artery disease Mother   . Diabetes Maternal Aunt   . Pancreatic cancer Brother   . Stroke Paternal Grandmother   . Breast cancer Paternal Aunt   . Coronary artery disease Paternal Aunt   . Coronary artery disease Maternal Grandmother   . Coronary artery disease Maternal Uncle        2 Maternal Uncles   . Diabetes Maternal Uncle   . Stroke Sister   . Heart disease Neg Hx     SOCIAL HX: Quit smoking 1968   Current Outpatient Medications:  .  aspirin 81 MG tablet, Take 81 mg by mouth daily., Disp: , Rfl:  .  Cholecalciferol (VITAMIN D3) 2000 UNITS TABS, Take by mouth daily., Disp: , Rfl:  .  glucose blood (FREESTYLE LITE)  test strip, CHECK BLOOD SUGAR ONCE DAILY.  Dx:E11.9, Disp: 100 each, Rfl: 3 .  Lancets (FREESTYLE) lancets, Check blood sugar once daily, Disp: 100 each, Rfl: 3 .  metFORMIN (GLUCOPHAGE XR) 750 MG 24 hr tablet, Take 1 tablet (750 mg total) by mouth daily with breakfast., Disp: 90 tablet, Rfl: 0 .  pravastatin (PRAVACHOL) 20 MG tablet, Take 1 tablet (20 mg total) by mouth daily., Disp: 90 tablet, Rfl: 0 .  sitaGLIPtin (JANUVIA) 100 MG tablet, Take 1 tablet (100 mg total) by mouth daily., Disp: 90 tablet, Rfl: 0 .  TRAVATAN Z 0.004 % SOLN ophthalmic solution, , Disp: , Rfl:   EXAM:  VITALS per patient if applicable:  GENERAL: alert, oriented, appears well and in no acute distress  HEENT: atraumatic, conjunttiva clear, no obvious abnormalities on inspection of external nose and ears  NECK: normal movements of the head and neck  LUNGS: on inspection no signs of respiratory distress, breathing rate appears normal, no obvious gross SOB, gasping or wheezing  CV: no obvious cyanosis  MS: moves all visible extremities without noticeable abnormality  PSYCH/NEURO: pleasant and cooperative, no obvious depression or anxiety, speech and thought processing grossly intact  ASSESSMENT AND PLAN:  Discussed the following assessment and plan:  #1 COVID-19 infection.  Fortunately, he has been fully vaccinated and symptoms are very mild.  There has already been discussion about possible treatment options (with health dept) but he declines.  He has no dyspnea and stable pulse oximetry. -We discussed appropriate isolation based on CDC guidelines. -Follow-up immediately for any increased shortness of breath or other concerns  #2 type 2 diabetes with recent worsening control  -He will go ahead and increase his Metformin extended release to 1000 mg daily.  He has some leftover 500 mg tablets and will take 2 daily.  We recommend office follow-up in the next couple weeks to reassess A1c and other labs.      I discussed the assessment and treatment plan with the patient. The patient was provided an opportunity to ask questions and all were answered. The patient agreed with the plan and demonstrated an understanding of the instructions.   The patient was advised to call back or seek an in-person evaluation if the symptoms worsen or if the condition fails to improve as anticipated.     [name], MD

## 2020-12-24 ENCOUNTER — Other Ambulatory Visit: Payer: Medicare HMO

## 2020-12-24 DIAGNOSIS — Z20822 Contact with and (suspected) exposure to covid-19: Secondary | ICD-10-CM

## 2020-12-25 LAB — SARS-COV-2, NAA 2 DAY TAT

## 2020-12-25 LAB — NOVEL CORONAVIRUS, NAA: SARS-CoV-2, NAA: NOT DETECTED

## 2021-01-27 DIAGNOSIS — R3912 Poor urinary stream: Secondary | ICD-10-CM | POA: Diagnosis not present

## 2021-01-27 DIAGNOSIS — N401 Enlarged prostate with lower urinary tract symptoms: Secondary | ICD-10-CM | POA: Diagnosis not present

## 2021-01-27 DIAGNOSIS — R3914 Feeling of incomplete bladder emptying: Secondary | ICD-10-CM | POA: Diagnosis not present

## 2021-01-31 ENCOUNTER — Ambulatory Visit (INDEPENDENT_AMBULATORY_CARE_PROVIDER_SITE_OTHER): Payer: Medicare HMO | Admitting: Family Medicine

## 2021-01-31 ENCOUNTER — Other Ambulatory Visit: Payer: Self-pay

## 2021-01-31 ENCOUNTER — Encounter: Payer: Self-pay | Admitting: Family Medicine

## 2021-01-31 VITALS — BP 120/80 | HR 77 | Temp 98.0°F | Ht 70.0 in | Wt 204.2 lb

## 2021-01-31 DIAGNOSIS — E785 Hyperlipidemia, unspecified: Secondary | ICD-10-CM | POA: Diagnosis not present

## 2021-01-31 DIAGNOSIS — E119 Type 2 diabetes mellitus without complications: Secondary | ICD-10-CM

## 2021-01-31 LAB — POCT GLYCOSYLATED HEMOGLOBIN (HGB A1C): Hemoglobin A1C: 7.6 % — AB (ref 4.0–5.6)

## 2021-01-31 LAB — BASIC METABOLIC PANEL
BUN: 17 mg/dL (ref 6–23)
CO2: 29 mEq/L (ref 19–32)
Calcium: 9.4 mg/dL (ref 8.4–10.5)
Chloride: 103 mEq/L (ref 96–112)
Creatinine, Ser: 1.18 mg/dL (ref 0.40–1.50)
GFR: 57.78 mL/min — ABNORMAL LOW (ref >60.00)
Glucose, Bld: 126 mg/dL — ABNORMAL HIGH (ref 70–99)
Potassium: 4.5 mEq/L (ref 3.5–5.1)
Sodium: 138 mEq/L (ref 135–145)

## 2021-01-31 LAB — HEPATIC FUNCTION PANEL
ALT: 16 U/L (ref 0–53)
AST: 23 U/L (ref 0–37)
Albumin: 4.1 g/dL (ref 3.5–5.2)
Alkaline Phosphatase: 63 U/L (ref 39–117)
Bilirubin, Direct: 0.1 mg/dL (ref 0.0–0.3)
Total Bilirubin: 0.7 mg/dL (ref 0.2–1.2)
Total Protein: 6.9 g/dL (ref 6.0–8.3)

## 2021-01-31 LAB — LIPID PANEL
Cholesterol: 148 mg/dL (ref 0–200)
HDL: 50.1 mg/dL (ref >39.00)
LDL Cholesterol: 78 mg/dL (ref 0–99)
NonHDL: 98.28
Total CHOL/HDL Ratio: 3
Triglycerides: 102 mg/dL (ref 0.0–149.0)
VLDL: 20.4 mg/dL (ref 0.0–40.0)

## 2021-01-31 LAB — MICROALBUMIN / CREATININE URINE RATIO
Creatinine,U: 86.3 mg/dL
Microalb Creat Ratio: 0.8 mg/g (ref 0.0–30.0)
Microalb, Ur: <0.7 mg/dL (ref 0.0–1.9)

## 2021-01-31 MED ORDER — RYBELSUS 3 MG PO TABS: 3.0000 mg | ORAL_TABLET | Freq: Every day | ORAL | 3 refills | Status: DC

## 2021-01-31 NOTE — Patient Instructions (Signed)
Start the Rybelsus 3 mg once daily  Continue the Januvia and Metformin  STOP the aspirin  Make sure we get 3 month follow up

## 2021-01-31 NOTE — Progress Notes (Signed)
Established Patient Office Visit  Subjective:  Patient ID: Robert Warner, male    DOB: 05/22/1941  Age: 80 y.o. MRN: [id number]  CC:  Chief Complaint  Patient presents with  . Blood Sugar Problem    HPI Robert Warner presents for medical follow-up.  He has type 2 diabetes, hyperlipidemia, BPH.  He recently saw urologist for slow urinary stream.  Was placed back on Rapaflo and is seen good improvement since then.  Patient had called recently with concern his blood sugars been climbing some past few months.  He is to get fastings low 100s and recently has had some room 160.  His A1c today is up to 7.6% up from mid 6 range previously.  He takes Januvia 100 mg daily and also on Metformin.  He tried increasing his Metformin to twice daily but had intolerance secondary to GI side effects.  He remains on pravastatin for hyperlipidemia.  Tolerating well.  He is still taking aspirin daily.  No history of hypertension.  No recent urine microalbumin.  Eye exams up-to-date.  No neuropathy symptoms.  Past Medical History:  Diagnosis Date  . Arthritis   . Diabetes mellitus   . Hyperlipidemia   . Hypertension     Past Surgical History:  Procedure Laterality Date  . APPENDECTOMY  1968  . CATARACT EXTRACTION Left 07/2000   states 2 weeks ago (first of sept) OD due in Dec   . COLONOSCOPY  [id number]   Negative, Dr. Sharlett Iles   . KIDNEY STONE SURGERY  10/2018  . PROSTATE BIOPSY  2006   Dr.Sigmund Tannebaum    Family History  Problem Relation Age of Onset  . Stroke Father 59  . Hypertension Mother   . Coronary artery disease Mother   . Diabetes Maternal Aunt   . Pancreatic cancer Brother   . Stroke Paternal Grandmother   . Breast cancer Paternal Aunt   . Coronary artery disease Paternal Aunt   . Coronary artery disease Maternal Grandmother   . Coronary artery disease Maternal Uncle        2 Maternal Uncles   . Diabetes Maternal Uncle   . Stroke Sister   . Heart disease Neg Hx      Social History   Socioeconomic History  . Marital status: Married    Spouse name: Not on file  . Number of children: 2  . Years of education: Not on file  . Highest education level: Not on file  Occupational History  . Occupation: retired Airline pilot  Tobacco Use  . Smoking status: Former Smoker    Packs/day: 0.50    Years: 10.00    Pack years: 5.00    Quit date: 11/13/1966    Years since quitting: 54.2  . Smokeless tobacco: Never Used  Vaping Use  . Vaping Use: Never used  Substance and Sexual Activity  . Alcohol use: Yes    Alcohol/week: 2.0 standard drinks    Types: 2 Glasses of wine per week    Comment: Occasional wine or beer  . Drug use: No  . Sexual activity: Not on file  Other Topics Concern  . Not on file  Social History Narrative  . Not on file   Social Determinants of Health   Financial Resource Strain: Low Risk   . Difficulty of Paying Living Expenses: Not hard at all  Food Insecurity: No Food Insecurity  . Worried About Charity fundraiser in the Last Year: Never true  . Ran Out  of Food in the Last Year: Never true  Transportation Needs: No Transportation Needs  . Lack of Transportation (Medical): No  . Lack of Transportation (Non-Medical): No  Physical Activity: Sufficiently Active  . Days of Exercise per Week: 5 days  . Minutes of Exercise per Session: 60 min  Stress: No Stress Concern Present  . Feeling of Stress : Not at all  Social Connections: Socially Integrated  . Frequency of Communication with Friends and Family: More than three times a week  . Frequency of Social Gatherings with Friends and Family: More than three times a week  . Attends Religious Services: More than 4 times per year  . Active Member of Clubs or Organizations: Yes  . Attends Archivist Meetings: More than 4 times per year  . Marital Status: Married  Human resources officer Violence: Not At Risk  . Fear of Current or Ex-Partner: No  . Emotionally Abused: No  .  Physically Abused: No  . Sexually Abused: No    Outpatient Medications Prior to Visit  Medication Sig Dispense Refill  . Cholecalciferol (VITAMIN D3) 2000 UNITS TABS Take by mouth daily.    Marland Kitchen glucose blood (FREESTYLE LITE) test strip CHECK BLOOD SUGAR ONCE DAILY.  Dx:E11.9 100 each 3  . Lancets (FREESTYLE) lancets Check blood sugar once daily 100 each 3  . metFORMIN (GLUCOPHAGE XR) 750 MG 24 hr tablet Take 1 tablet (750 mg total) by mouth daily with breakfast. 90 tablet 0  . pravastatin (PRAVACHOL) 20 MG tablet Take 1 tablet (20 mg total) by mouth daily. 90 tablet 0  . sitaGLIPtin (JANUVIA) 100 MG tablet Take 1 tablet (100 mg total) by mouth daily. 90 tablet 0  . TRAVATAN Z 0.004 % SOLN ophthalmic solution     . aspirin 81 MG tablet Take 81 mg by mouth daily.     No facility-administered medications prior to visit.    No Known Allergies  ROS Review of Systems  Constitutional: Negative for fatigue and unexpected weight change.  Eyes: Negative for visual disturbance.  Respiratory: Negative for cough, chest tightness and shortness of breath.   Cardiovascular: Negative for chest pain, palpitations and leg swelling.  Endocrine: Negative for polydipsia and polyuria.  Neurological: Negative for dizziness, syncope, weakness, light-headedness and headaches.      Objective:    Physical Exam Constitutional:      Appearance: He is well-developed.  HENT:     Right Ear: External ear normal.     Left Ear: External ear normal.  Eyes:     Pupils: Pupils are equal, round, and reactive to light.  Neck:     Thyroid: No thyromegaly.  Cardiovascular:     Rate and Rhythm: Normal rate and regular rhythm.  Pulmonary:     Effort: Pulmonary effort is normal. No respiratory distress.     Breath sounds: Normal breath sounds. No wheezing or rales.  Musculoskeletal:     Cervical back: Neck supple.     Right lower leg: No edema.     Left lower leg: No edema.  Neurological:     Mental Status: He  is alert and oriented to person, place, and time.     BP 120/80   Pulse 77   Temp 98 F (36.7 C) (Oral)   Ht 5\' 10"  (1.778 m)   Wt 204 lb 3 oz (92.6 kg)   SpO2 97%   BMI 29.30 kg/m  Wt Readings from Last 3 Encounters:  01/31/21 204 lb 3 oz (92.6  kg)  07/07/19 208 lb 8 oz (94.6 kg)  01/03/19 206 lb 1.6 oz (93.5 kg)     Health Maintenance Due  Topic Date Due  . TETANUS/TDAP  06/15/2018  . FOOT EXAM  07/06/2020  . URINE MICROALBUMIN  07/06/2020    There are no preventive care reminders to display for this patient.  Lab Results  Component Value Date   TSH 1.25 10/15/2013   Lab Results  Component Value Date   WBC 4.6 08/22/2010   HGB 14.8 08/22/2010   HCT 44.7 08/22/2010   MCV 87.4 08/22/2010   PLT 144.0 (L) 08/22/2010   Lab Results  Component Value Date   NA 138 07/07/2019   K 4.8 07/07/2019   CO2 28 07/07/2019   GLUCOSE 117 (H) 07/07/2019   BUN 18 07/07/2019   CREATININE 1.23 07/07/2019   BILITOT 1.0 07/07/2019   ALKPHOS 57 07/07/2019   AST 24 07/07/2019   ALT 15 07/07/2019   PROT 6.8 07/07/2019   ALBUMIN 4.2 07/07/2019   CALCIUM 9.3 07/07/2019   GFR 68.48 07/07/2019   Lab Results  Component Value Date   CHOL 162 07/07/2019   Lab Results  Component Value Date   HDL 48.30 07/07/2019   Lab Results  Component Value Date   LDLCALC 98 07/07/2019   Lab Results  Component Value Date   TRIG 80.0 07/07/2019   Lab Results  Component Value Date   CHOLHDL 3 07/07/2019   Lab Results  Component Value Date   HGBA1C 7.6 (A) 01/31/2021      Assessment & Plan:   #1 type 2 diabetes.  Somewhat worsening control with A1c today 7.6%.  Patient intolerant of higher doses of Metformin.  -Continue Januvia 100 mg daily and Metformin extended release 750 mg daily -We discussed other options.  We will try Rybelsus 3 mg once daily and consider further titration of tolerating well after a month or so.  He does not have any contraindication such as history of  pancreatitis or family history of medullary thyroid cancer. -Recommend 93-month follow-up and recheck A1c then -Patient was instructed to stop aspirin with no clear use at his age of 49 with no prior history of CVA or CAD.  #2 dyslipidemia treated with pravastatin -Recheck lipid and hepatic panel  #3 history of BPH symptomatically improved on Rapaflo  Meds ordered this encounter  Medications  . Semaglutide (RYBELSUS) 3 MG TABS    Sig: Take 3 mg by mouth daily.    Dispense:  30 tablet    Refill:  3    Follow-up: Return in about 3 months (around 05/03/2021).    [name], MD

## 2021-02-22 ENCOUNTER — Other Ambulatory Visit: Payer: Self-pay | Admitting: Family Medicine

## 2021-03-09 ENCOUNTER — Other Ambulatory Visit (HOSPITAL_BASED_OUTPATIENT_CLINIC_OR_DEPARTMENT_OTHER): Payer: Self-pay

## 2021-03-09 ENCOUNTER — Ambulatory Visit: Payer: Medicare HMO | Attending: Internal Medicine

## 2021-03-09 ENCOUNTER — Other Ambulatory Visit: Payer: Self-pay

## 2021-03-09 DIAGNOSIS — Z23 Encounter for immunization: Secondary | ICD-10-CM

## 2021-03-09 MED ORDER — PFIZER-BIONT COVID-19 VAC-TRIS 30 MCG/0.3ML IM SUSP
INTRAMUSCULAR | 0 refills | Status: DC
  Filled 2021-03-09: qty 0.3, 1d supply, fill #0

## 2021-03-09 NOTE — Progress Notes (Signed)
   Covid-19 Vaccination Clinic  Name:  MARSHAUN LORTIE    MRN: [id number] DOB: 02/04/1941  03/09/2021  Mr. Outman was observed post Covid-19 immunization for 15 minutes without incident. He was provided with Vaccine Information Sheet and instruction to access the V-Safe system.   Mr. Schuyler was instructed to call 911 with any severe reactions post vaccine: Marland Kitchen Difficulty breathing  . Swelling of face and throat  . A fast heartbeat  . A bad rash all over body  . Dizziness and weakness   Immunizations Administered    Name Date Dose VIS Date Route   PFIZER Comrnaty(Gray TOP) Covid-19 Vaccine 03/09/2021  2:36 PM 0.3 mL 10/21/2020 Intramuscular   Manufacturer: [employer]   Lot: [id number]   NDC: [id number]

## 2021-03-30 ENCOUNTER — Telehealth: Payer: Self-pay | Admitting: Family Medicine

## 2021-03-30 NOTE — Telephone Encounter (Signed)
PA pending. ([name]) - [id number]

## 2021-03-30 NOTE — Telephone Encounter (Signed)
Pharmacy made patient aware that he needs a prior approval before refill for Semaglutide (RYBELSUS) 3 MG TABS   His medications is due to run out on Friday.  Please send to Pharmacy:  [facility] [place], [place] - [address] [facility]  [place], [place] [address]  Phone:  [phone] Fax:  [phone]

## 2021-04-04 NOTE — Telephone Encounter (Signed)
PA approved.

## 2021-04-05 ENCOUNTER — Telehealth: Payer: Self-pay | Admitting: Family Medicine

## 2021-04-05 NOTE — Chronic Care Management (AMB) (Signed)
  Chronic Care Management   Note  04/05/2021 Name: KAREE CHRISTOPHERSON MRN: [id number] DOB: 10-May-1941  [name] is a 80 y.o. year old male who is a primary care patient of Burchette, Alinda Sierras, MD. I reached out to [name] by phone today in response to a referral sent by Mr. Jaclyn Shaggy Armacost's PCP, [name], MD.   Mr. Noll was given information about Chronic Care Management services today including:  1. CCM service includes personalized support from designated clinical staff supervised by his physician, including individualized plan of care and coordination with other care providers 2. 24/7 contact phone numbers for assistance for urgent and routine care needs. 3. Service will only be billed when office clinical staff spend 20 minutes or more in a month to coordinate care. 4. Only one practitioner may furnish and bill the service in a calendar month. 5. The patient may stop CCM services at any time (effective at the end of the month) by phone call to the office staff.   Patient agreed to services and verbal consent obtained.   Follow up plan:   Tatjana Secretary/administrator

## 2021-05-04 ENCOUNTER — Encounter: Payer: Self-pay | Admitting: Family Medicine

## 2021-05-04 ENCOUNTER — Ambulatory Visit (INDEPENDENT_AMBULATORY_CARE_PROVIDER_SITE_OTHER): Payer: Medicare HMO | Admitting: Family Medicine

## 2021-05-04 ENCOUNTER — Other Ambulatory Visit: Payer: Self-pay

## 2021-05-04 VITALS — BP 124/70 | HR 80 | Temp 97.7°F | Wt 209.6 lb

## 2021-05-04 DIAGNOSIS — N401 Enlarged prostate with lower urinary tract symptoms: Secondary | ICD-10-CM | POA: Diagnosis not present

## 2021-05-04 DIAGNOSIS — R3914 Feeling of incomplete bladder emptying: Secondary | ICD-10-CM | POA: Diagnosis not present

## 2021-05-04 DIAGNOSIS — E119 Type 2 diabetes mellitus without complications: Secondary | ICD-10-CM | POA: Diagnosis not present

## 2021-05-04 LAB — POCT GLYCOSYLATED HEMOGLOBIN (HGB A1C): Hemoglobin A1C: 6.3 % — AB (ref 4.0–5.6)

## 2021-05-04 MED ORDER — RYBELSUS 3 MG PO TABS: 3.0000 mg | ORAL_TABLET | Freq: Every day | ORAL | 3 refills | Status: DC

## 2021-05-04 NOTE — Patient Instructions (Signed)
Your A1C is greatly improved to 6.3% (was 7.6).  Let's plan on 6 month follow up.

## 2021-05-04 NOTE — Progress Notes (Signed)
Established Patient Office Visit  Subjective:  Patient ID: Robert Warner, male    DOB: [birth year]  Age: 80 y.o. MRN: [id number]  CC:  Chief Complaint  Patient presents with   Follow-up    HPI Robert Warner presents for follow-up regarding type 2 diabetes.  He had recent A1c back in March of 7.6%.  We added low-dose Rybelsus 3 mg daily and he is tolerating well with no side effects.  His fasting blood sugars are reviewed.  They had been around 150s and currently low 100s and most of them are below 130.  Feels well overall.  No specific complaints.  He continues to play golf at least 2 times weekly.  No recent chest pains or dizziness.  Recent lipids well controlled.  Past Medical History:  Diagnosis Date   Arthritis    Diabetes mellitus    Hyperlipidemia    Hypertension     Past Surgical History:  Procedure Laterality Date   APPENDECTOMY  1968   CATARACT EXTRACTION Left 07/2000   states 2 weeks ago (first of sept) OD due in Dec    COLONOSCOPY  2004,2009   Negative, Dr. Sharlett Iles    KIDNEY STONE SURGERY  10/2018   PROSTATE BIOPSY  2006   Dr.Sigmund Tannebaum    Family History  Problem Relation Age of Onset   Stroke Father 37   Hypertension Mother    Coronary artery disease Mother    Diabetes Maternal Aunt    Pancreatic cancer Brother    Stroke Paternal Grandmother    Breast cancer Paternal Aunt    Coronary artery disease Paternal Aunt    Coronary artery disease Maternal Grandmother    Coronary artery disease Maternal Uncle        2 Maternal Uncles    Diabetes Maternal Uncle    Stroke Sister    Heart disease Neg Hx     Social History   Socioeconomic History   Marital status: Married    Spouse name: Not on file   Number of children: 2   Years of education: Not on file   Highest education level: Not on file  Occupational History   Occupation: retired Airline pilot  Tobacco Use   Smoking status: Former    Packs/day: 0.50    Years: 10.00    Pack years:  5.00    Types: Cigarettes    Quit date: 11/13/1966    Years since quitting: 54.5   Smokeless tobacco: Never  Vaping Use   Vaping Use: Never used  Substance and Sexual Activity   Alcohol use: Yes    Alcohol/week: 2.0 standard drinks    Types: 2 Glasses of wine per week    Comment: Occasional wine or beer   Drug use: No   Sexual activity: Not on file  Other Topics Concern   Not on file  Social History Narrative   Not on file   Social Determinants of Health   Financial Resource Strain: Low Risk    Difficulty of Paying Living Expenses: Not hard at all  Food Insecurity: No Food Insecurity   Worried About Charity fundraiser in the Last Year: Never true   [place] in the Last Year: Never true  Transportation Needs: No Transportation Needs   Lack of Transportation (Medical): No   Lack of Transportation (Non-Medical): No  Physical Activity: Sufficiently Active   Days of Exercise per Week: 5 days   Minutes of Exercise per Session: 60 min  Stress: No Stress Concern Present   Feeling of Stress : Not at all  Social Connections: Socially Integrated   Frequency of Communication with Friends and Family: More than three times a week   Frequency of Social Gatherings with Friends and Family: More than three times a week   Attends Religious Services: More than 4 times per year   Active Member of [employer] or Organizations: Yes   Attends Music therapist: More than 4 times per year   Marital Status: Married  Human resources officer Violence: Not At Risk   Fear of Current or Ex-Partner: No   Emotionally Abused: No   Physically Abused: No   Sexually Abused: No    Outpatient Medications Prior to Visit  Medication Sig Dispense Refill   Cholecalciferol (VITAMIN D3) 2000 UNITS TABS Take by mouth daily.     COVID-19 mRNA Vac-TriS, Pfizer, (PFIZER-BIONT COVID-19 VAC-TRIS) SUSP injection Inject into the muscle. 0.3 mL 0   glucose blood (FREESTYLE LITE) test strip CHECK BLOOD SUGAR ONCE  DAILY.  Dx:E11.9 100 each 3   JANUVIA 100 MG tablet TAKE 1 TABLET(100 MG) BY MOUTH DAILY. NEED OFFICE VISIT FOR REFILLS. 90 tablet 0   Lancets (FREESTYLE) lancets Check blood sugar once daily 100 each 3   metFORMIN (GLUCOPHAGE-XR) 750 MG 24 hr tablet TAKE 1 TABLET(750 MG) BY MOUTH DAILY WITH BREAKFAST 90 tablet 0   pravastatin (PRAVACHOL) 20 MG tablet TAKE 1 TABLET(20 MG) BY MOUTH DAILY. NEED OFFICE VISIT FOR REFILLS. 90 tablet 0   TRAVATAN Z 0.004 % SOLN ophthalmic solution      Semaglutide (RYBELSUS) 3 MG TABS Take 3 mg by mouth daily. 30 tablet 3   No facility-administered medications prior to visit.    No Known Allergies  ROS Review of Systems  Respiratory:  Negative for shortness of breath.   Cardiovascular:  Negative for chest pain.  Endocrine: Negative for polydipsia and polyuria.     Objective:    Physical Exam Vitals reviewed.  Constitutional:      Appearance: Normal appearance.  Cardiovascular:     Rate and Rhythm: Normal rate and regular rhythm.  Pulmonary:     Effort: Pulmonary effort is normal.     Breath sounds: Normal breath sounds.  Neurological:     Mental Status: He is alert.    BP 124/70 (BP Location: Left Arm, Patient Position: Sitting, Cuff Size: Normal)   Pulse 80   Temp 97.7 F (36.5 C) (Oral)   Wt 209 lb 9.6 oz (95.1 kg)   SpO2 98%   BMI 30.07 kg/m  Wt Readings from Last 3 Encounters:  05/04/21 209 lb 9.6 oz (95.1 kg)  01/31/21 204 lb 3 oz (92.6 kg)  07/07/19 208 lb 8 oz (94.6 kg)     Health Maintenance Due  Topic Date Due   Zoster Vaccines- Shingrix (1 of 2) Never done   TETANUS/TDAP  06/15/2018   FOOT EXAM  07/06/2020    There are no preventive care reminders to display for this patient.  Lab Results  Component Value Date   TSH 1.25 10/15/2013   Lab Results  Component Value Date   WBC 4.6 08/22/2010   HGB 14.8 08/22/2010   HCT 44.7 08/22/2010   MCV 87.4 08/22/2010   PLT 144.0 (L) 08/22/2010   Lab Results  Component  Value Date   NA 138 01/31/2021   K 4.5 01/31/2021   CO2 29 01/31/2021   GLUCOSE 126 (H) 01/31/2021   BUN 17 01/31/2021  CREATININE 1.18 01/31/2021   BILITOT 0.7 01/31/2021   ALKPHOS 63 01/31/2021   AST 23 01/31/2021   ALT 16 01/31/2021   PROT 6.9 01/31/2021   ALBUMIN 4.1 01/31/2021   CALCIUM 9.4 01/31/2021   GFR 57.78 (L) 01/31/2021   Lab Results  Component Value Date   CHOL 148 01/31/2021   Lab Results  Component Value Date   HDL 50.10 01/31/2021   Lab Results  Component Value Date   LDLCALC 78 01/31/2021   Lab Results  Component Value Date   TRIG 102.0 01/31/2021   Lab Results  Component Value Date   CHOLHDL 3 01/31/2021   Lab Results  Component Value Date   HGBA1C 6.3 (A) 05/04/2021      Assessment & Plan:   Problem List Items Addressed This Visit       Unprioritized   Type 2 diabetes mellitus, controlled ([place]) - Primary   Relevant Medications   Semaglutide (RYBELSUS) 3 MG TABS   Other Relevant Orders   POCT glycosylated hemoglobin (Hb A1C) (Completed)  A1c improved from 7.6% to 6.3% with recent addition of low-dose Rybelsus.  He is tolerating well with no side effects.  We explained that we normally titrate this up to 7 mg daily but given the fact has had such a great response we will keep him at the 3 mg dose for now. -Recommend 27-month follow-up  Meds ordered this encounter  Medications   Semaglutide (RYBELSUS) 3 MG TABS    Sig: Take 3 mg by mouth daily.    Dispense:  90 tablet    Refill:  3    Follow-up: Return in about 6 months (around 11/03/2021).    [name], MD

## 2021-05-18 ENCOUNTER — Telehealth: Payer: Self-pay | Admitting: Pharmacist

## 2021-05-18 NOTE — Chronic Care Management (AMB) (Signed)
    Chronic Care Management Pharmacy Assistant   Name: Robert Warner  MRN: [id number] DOB: [birth year]  Reason for Encounter: Chart Prep for CPP visit on 07.11.2022   Conditions to be addressed/monitored: HLD and DMII  Primary concerns for visit include: DMII  Recent office visits:  06.22.2022 [name], MD seen for follow-up on type 2 diabetes 03.21.2022 [name], MD seen for follow-up on type 2 diabetes, hyperlipidemia, BPH. Medication started Semaglutide (RYBELSUS) 3 mg tables daily and aspirin 81 mg discontinued. 02.09.2022 [name], MD Video Visit seen for follow-up- recently tested positive for Covid- medication discontinued sildenafil (VIAGRA) 50 MG tablet.  Recent consult visits:  03.17.2022 Karen Kays Nurse Practitioner- urology (unable to see note) 01.26.2022 [name] Ophthalmology seen for follow-up angle glaucoma, bilateral, moderate stage  Hospital visits:  None in previous 6 months  Medications: Outpatient Encounter Medications as of 05/18/2021  Medication Sig   Cholecalciferol (VITAMIN D3) 2000 UNITS TABS Take by mouth daily.   COVID-19 mRNA Vac-TriS, Pfizer, (PFIZER-BIONT COVID-19 VAC-TRIS) SUSP injection Inject into the muscle.   glucose blood (FREESTYLE LITE) test strip CHECK BLOOD SUGAR ONCE DAILY.  Dx:E11.9   JANUVIA 100 MG tablet TAKE 1 TABLET(100 MG) BY MOUTH DAILY. NEED OFFICE VISIT FOR REFILLS.   Lancets (FREESTYLE) lancets Check blood sugar once daily   metFORMIN (GLUCOPHAGE-XR) 750 MG 24 hr tablet TAKE 1 TABLET(750 MG) BY MOUTH DAILY WITH BREAKFAST   pravastatin (PRAVACHOL) 20 MG tablet TAKE 1 TABLET(20 MG) BY MOUTH DAILY. NEED OFFICE VISIT FOR REFILLS.   Semaglutide (RYBELSUS) 3 MG TABS Take 3 mg by mouth daily.   TRAVATAN Z 0.004 % SOLN ophthalmic solution    No facility-administered encounter medications on file as of 05/18/2021.   Patient Questions:   Have you seen any other providers since your last  visit? No Any changes in your medications or health? No Any side effects from any medications? No Do you have an symptoms or problems not managed by your medications? No Any concerns about your health right now? No Has your provider asked that you check blood pressure, blood sugar, or follow special diet at home?  He states that he takes his blood sugar four to five times a week. Do you get any type of exercise on a regular basis?  He plays golf  he takes [employer] classes two to three times a week Can you think of a goal you would like to reach for your health?  He would like to reach a weight goal of 195 lbs. Do you have any problems getting your medications?  He stated that he did have some issues with his insurance wanting him to change to generic. But his PCP has been helping with that. Is there anything that you would like to discuss during the appointment? No  The patient was asked to please bring medications, blood pressure/ blood sugar log, and supplements to him appointment.     Star Rating Drugs: Medication Dispensed Quantity Pharmacy  Pravastatin 20 mg 04.12.2022 90 [facility]  Metformin 750 mg 04.12.2022 90 [facility]  Januvia 100 mg 04.12.2022 90 [facility]    Amilia ([place]) Mare Ferrari, [place] Pharmacist Assistant [phone]

## 2021-05-23 ENCOUNTER — Ambulatory Visit (INDEPENDENT_AMBULATORY_CARE_PROVIDER_SITE_OTHER): Payer: Medicare HMO | Admitting: Pharmacist

## 2021-05-23 ENCOUNTER — Other Ambulatory Visit: Payer: Self-pay | Admitting: Family Medicine

## 2021-05-23 ENCOUNTER — Other Ambulatory Visit: Payer: Self-pay

## 2021-05-23 DIAGNOSIS — E785 Hyperlipidemia, unspecified: Secondary | ICD-10-CM

## 2021-05-23 DIAGNOSIS — E119 Type 2 diabetes mellitus without complications: Secondary | ICD-10-CM

## 2021-05-23 MED ORDER — RYBELSUS 7 MG PO TABS: 7.0000 mg | ORAL_TABLET | Freq: Every day | ORAL | 3 refills | Status: DC

## 2021-05-23 NOTE — Progress Notes (Signed)
Chronic Care Management Pharmacy Note  05/23/2021 Name:  Robert Warner MRN:  [id number] DOB:  12-Nov-1941  Summary: A1c at goal < 7% LDL at goal < 100  Recommendations/Changes made from today's visit: -Recommend discontinuing Januvia and increasing Rybelsus to 7 mg based on additive risk for side effects with DPP4 and GLP1   Plan: Follow up in 1 month for DM assessment   Subjective: Robert Warner is an 80 y.o. year old male who is a primary patient of Burchette, Alinda Sierras, MD.  The CCM team was consulted for assistance with disease management and care coordination needs.    Engaged with patient face to face for initial visit in response to provider referral for pharmacy case management and/or care coordination services.   Consent to Services:  The patient was given the following information about Chronic Care Management services today, agreed to services, and gave verbal consent: 1. CCM service includes personalized support from designated clinical staff supervised by the primary care provider, including individualized plan of care and coordination with other care providers 2. 24/7 contact phone numbers for assistance for urgent and routine care needs. 3. Service will only be billed when office clinical staff spend 20 minutes or more in a month to coordinate care. 4. Only one practitioner may furnish and bill the service in a calendar month. 5.The patient may stop CCM services at any time (effective at the end of the month) by phone call to the office staff. 6. The patient will be responsible for cost sharing (co-pay) of up to 20% of the service fee (after annual deductible is met). Patient agreed to services and consent obtained.  Patient Care Team: [name], MD as PCP - General (Family Medicine) [name], MD as Consulting Physician (Ophthalmology) Rosemary Holms, DPM as Consulting Physician (Podiatry) Minor, Orlie Pollen, DDS (Dentistry) Armbruster, Carlota Raspberry, MD as  Consulting Physician (Gastroenterology) Festus Aloe, MD as Consulting Physician (Urology) Viona Gilmore, [facility] as Pharmacist (Pharmacist)  Recent office visits: 05/04/21 [name], MD seen for type 2 diabetes follow up.  01/31/21 [name], MD seen for chronic conditions follow up. Prescribed Semaglutide (RYBELSUS) 3 mg tables daily and d/c'd aspirin 81 mg.  12/22/20 Burchette, Alinda Sierras, MD Video Visit: seen for COVID infection. Discontinued sildenafil (VIAGRA) 50 MG tablet.  Recent consult visits: 01/27/21 Karen Kays, NP (urology): Unable to access notes.  12/08/20 [name] Ophthalmology seen for follow-up for angle glaucoma, bilateral, moderate stage.   Hospital visits: None in previous 6 months   Objective:  Lab Results  Component Value Date   CREATININE 1.18 01/31/2021   BUN 17 01/31/2021   GFR 57.78 (L) 01/31/2021   GFRNONAA 105.53 08/22/2010   GFRAA 95 06/15/2008   NA 138 01/31/2021   K 4.5 01/31/2021   CALCIUM 9.4 01/31/2021   CO2 29 01/31/2021   GLUCOSE 126 (H) 01/31/2021    Lab Results  Component Value Date/Time   HGBA1C 6.3 (A) 05/04/2021 07:27 AM   HGBA1C 7.6 (A) 01/31/2021 01:11 PM   HGBA1C 7.4 (H) 01/28/2018 09:13 AM   HGBA1C 7.4 07/04/2017 12:00 AM   HGBA1C 6.8 (H) 06/14/2016 09:53 AM   GFR 57.78 (L) 01/31/2021 01:41 PM   GFR 68.48 07/07/2019 09:09 AM   MICROALBUR <0.7 01/31/2021 01:41 PM   MICROALBUR <0.7 07/07/2019 09:09 AM    Last diabetic Eye exam:  Lab Results  Component Value Date/Time   HMDIABEYEEXA No Retinopathy 12/08/2019 12:00 AM  Last diabetic Foot exam:  Lab Results  Component Value Date/Time   HMDIABFOOTEX normal 06/07/2015 12:00 AM     Lab Results  Component Value Date   CHOL 148 01/31/2021   HDL 50.10 01/31/2021   LDLCALC 78 01/31/2021   LDLDIRECT 64.7 03/24/2013   TRIG 102.0 01/31/2021   CHOLHDL 3 01/31/2021    Hepatic Function Latest Ref Rng & Units 01/31/2021 07/07/2019 05/27/2018   Total Protein 6.0 - 8.3 g/dL 6.9 6.8 7.0  Albumin 3.5 - 5.2 g/dL 4.1 4.2 4.1  AST 0 - 37 U/L _0 ALT 0 - 53 U/L _1 Alk Phosphatase 39 - 117 U/L 63 57 56  Total Bilirubin 0.2 - 1.2 mg/dL 0.7 1.0 0.7  Bilirubin, Direct 0.0 - 0.3 mg/dL 0.1 0.2 0.2    Lab Results  Component Value Date/Time   TSH 1.25 10/15/2013 10:15 AM   TSH 0.86 07/26/2011 10:03 AM    CBC Latest Ref Rng & Units 08/22/2010 08/16/2009 06/15/2008  WBC 4.5 - 10.5 10*3/microliter 4.6 4.1(L) 4.4(L)  Hemoglobin 13.0 - 17.0 g/dL 14.8 15.9 15.8  Hematocrit 39.0 - 52.0 % 44.7 47.8 46.8  Platelets 150.0 - 400.0 K/uL 144.0(L) 153.0 168    No results found for: VD25OH  Clinical ASCVD: No  The ASCVD Risk score Mikey Bussing DC Jr., et al., 2013) failed to calculate for the following reasons:   The 2013 ASCVD risk score is only valid for ages 57 to 36    Depression screen PHQ 2/9 06/30/2020 11/25/2018 11/25/2018  Decreased Interest 0 0 0  Down, Depressed, Hopeless 0 0 0  PHQ - 2 Score 0 0 0  Altered sleeping 0 1 -  Tired, decreased energy 0 1 -  Change in appetite 0 0 -  Feeling bad or failure about yourself  0 0 -  Trouble concentrating 0 0 -  Moving slowly or fidgety/restless 0 0 -  Suicidal thoughts 0 0 -  PHQ-9 Score 0 2 -  Difficult doing work/chores Not difficult at all - -      Social History   Tobacco Use  Smoking Status Former   Packs/day: 0.50   Years: 10.00   Pack years: 5.00   Types: Cigarettes   Quit date: 11/13/1966   Years since quitting: 54.5  Smokeless Tobacco Never   BP Readings from Last 3 Encounters:  05/04/21 124/70  01/31/21 120/80  07/07/19 122/82   Pulse Readings from Last 3 Encounters:  05/04/21 80  01/31/21 77  07/07/19 68   Wt Readings from Last 3 Encounters:  05/04/21 209 lb 9.6 oz (95.1 kg)  01/31/21 204 lb 3 oz (92.6 kg)  07/07/19 208 lb 8 oz (94.6 kg)   BMI Readings from Last 3 Encounters:  05/04/21 30.07 kg/m  01/31/21 29.30 kg/m  07/07/19 29.92 kg/m     Assessment/Interventions: Review of patient past medical history, allergies, medications, health status, including review of consultants reports, laboratory and other test data, was performed as part of comprehensive evaluation and provision of chronic care management services.   SDOH:  (Social Determinants of Health) assessments and interventions performed: Yes SDOH Interventions    Flowsheet Row Most Recent Value  SDOH Interventions   Financial Strain Interventions Intervention Not Indicated  Transportation Interventions Intervention Not Indicated      SDOH Screenings   Alcohol Screen: Low Risk    Last Alcohol Screening Score (AUDIT): 1  Depression (PHQ2-9): Low Risk    PHQ-2 Score: 0  Financial  Resource Strain: Low Risk    Difficulty of Paying Living Expenses: Not hard at all  Food Insecurity: No Food Insecurity   Worried About Charity fundraiser in the Last Year: Never true   Ran Out of Food in the Last Year: Never true  Housing: Low Risk    Last Housing Risk Score: 0  Physical Activity: Sufficiently Active   Days of Exercise per Week: 5 days   Minutes of Exercise per Session: 60 min  Social Connections: Engineer, building services of Communication with Friends and Family: More than three times a week   Frequency of Social Gatherings with Friends and Family: More than three times a week   Attends Religious Services: More than 4 times per year   Active Member of [employer] or Organizations: Yes   Attends Music therapist: More than 4 times per year   Marital Status: Married  Stress: No Stress Concern Present   Feeling of Stress : Not at all  Tobacco Use: Medium Risk   Smoking Tobacco Use: Former   Smokeless Tobacco Use: Never  Transportation Needs: No Data processing manager (Medical): No   Lack of Transportation (Non-Medical): No   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 He has been doing Scientist, research (physical sciences) work on [employer] and Wed and plays golf on [place] and Thursdays. He is a retired Animal nutritionist.   Patient is also going 3 days a week to [facility] He reports that he does exercise a minimum of 4 days a week.   Patient lives with his wife and has been married for 26 years. They have 2 daughters, the younger one just retired as principal of a school nearby and lives 1.5 miles away  from them. His older daughter lives in [place] and he sees her every now and then. They also have 2 grandchildren, the daughter is a Electrical engineer and grandson failed out of college and is currently working for [employer]. He got to see them all on the 4th of July.   Patient denies any current problems with his medications and was pleased with his last report for his diabetes.   CCM Care Plan  No Known Allergies  Medications Reviewed Today     Reviewed by [name], MD (Physician) on 05/04/21 at 0723  Med List Status: <None>   Medication Order Taking? Sig Documenting Provider Last Dose Status Informant  Cholecalciferol (VITAMIN D3) 2000 UNITS TABS [id number] Yes Take by mouth daily. [provider] Taking Active   COVID-19 mRNA Vac-TriS, Pfizer, (PFIZER-BIONT COVID-19 VAC-TRIS) SUSP injection [id number] Yes Inject into the muscle. Carlyle Basques, MD Taking Active   glucose blood (FREESTYLE LITE) test strip [id number] Yes CHECK BLOOD SUGAR ONCE DAILY.  Dx:E11.9 [name], MD Taking Active   JANUVIA 100 MG tablet [id number] Yes TAKE 1 TABLET(100 MG) BY MOUTH DAILY. NEED OFFICE VISIT FOR REFILLS. [name], MD Taking Active   Lancets (FREESTYLE) lancets [id number] Yes Check blood sugar once daily Hendricks Limes, MD Taking Active   metFORMIN  (GLUCOPHAGE-XR) 750 MG 24 hr tablet [id number] Yes TAKE 1 TABLET(750 MG) BY MOUTH DAILY WITH  BREAKFAST Burchette, Alinda Sierras, MD Taking Active   pravastatin (PRAVACHOL) 20 MG tablet [id number] Yes TAKE 1 TABLET(20 MG) BY MOUTH DAILY. NEED OFFICE VISIT FOR REFILLS. [name], MD Taking Active   Semaglutide [facility]) 3 MG TABS [id number] Yes Take 3 mg by mouth daily. [name], MD Taking Active   TRAVATAN Z 0.004 % SOLN ophthalmic solution [id number] Yes  [provider] Taking Active             Patient Active Problem List   Diagnosis Date Noted   Elevated blood pressure reading without diagnosis of hypertension 03/12/2012   Type 2 diabetes mellitus, controlled ([place]) 06/15/2008   Hyperlipidemia 06/15/2008   BPH (benign prostatic hyperplasia) 06/15/2008    Immunization History  Administered Date(s) Administered   Fluad Quad(high Dose 65+) 07/07/2019, 08/25/2020   Influenza Whole 08/02/2009, 08/13/2012   Influenza, High Dose Seasonal PF 08/04/2013, 07/14/2016, 08/01/2017   Influenza,inj,Quad PF,6+ Mos 07/31/2014   PFIZER Comirnaty(Gray Top)Covid-19 Tri-Sucrose Vaccine 03/09/2021   PFIZER(Purple Top)SARS-COV-2 Vaccination 11/26/2019, 12/17/2019, 08/17/2020   Pneumococcal Conjugate-13 06/07/2015   Pneumococcal Polysaccharide-23 10/15/2013   Td 06/15/2008   Zoster, Live 07/31/2014    Conditions to be addressed/monitored:  Hyperlipidemia, Diabetes, BPH, and Glaucoma  Care Plan : [place]  Updates made by Viona Gilmore, [place] since 05/23/2021 12:00 AM     Problem: Problem: Hyperlipidemia, Diabetes, BPH, and Glaucoma      Long-Range Goal: Patient-Specific Goal   Start Date: 05/23/2021  Expected End Date: 05/23/2022  This Visit's Progress: On track  Priority: High  Note:   Current Barriers:  Unable to independently monitor therapeutic efficacy Suboptimal therapeutic regimen for diabetes  Pharmacist Clinical Goal(s):  Patient will achieve  adherence to monitoring guidelines and medication adherence to achieve therapeutic efficacy through collaboration with PharmD and provider.   Interventions: 1:1 collaboration with [name], MD regarding development and update of comprehensive plan of care as evidenced by provider attestation and co-signature Inter-disciplinary care team collaboration (see longitudinal plan of care) Comprehensive medication review performed; medication list updated in electronic medical record  Hyperlipidemia: (LDL goal < 100) -Controlled -Current treatment: Pravastatin 20 mg 1 tablet daily -Medications previously tried: none  -Current dietary patterns: eating a vegetable with every meal; does not eat much fried foods -Current exercise habits: at least 4 days a week -Educated on Cholesterol goals;  Importance of limiting foods high in cholesterol; -Counseled on diet and exercise extensively Recommended to continue current medication  Diabetes (A1c goal <7%) -Controlled -Current medications: Rybelsus 3 mg 1 tablet daily Januvia 100 mg 1 tablet daily Metformin 750 mg XR 1 tablet daily with breakfast -Medications previously tried: metformin (higher dose of 500 mg twice daily caused stomach upset)  -Current home glucose readings fasting glucose: 90, 115, 100, 119, 80, 123, 127 post prandial glucose: does not check -Denies hypoglycemic/hyperglycemic symptoms -Current meal patterns:  breakfast: 2% milk and cereal (raisin bran or cheerios and bananas); grits & eggs & sausage and cheese on Sundays lunch: salad sometimes; [place] and ham sandwich, potato chips dinner: picks it up Tues and Fri; chicken; catfish with potatoes and vegetables; longhorn ribeye with baked potato and salad; vegetable with dinner snacks: very seldom dessert, peanuts, dried raisins (doesn't eat after dinner) drinks: water, Gatorade, iced tea (sweet) -Current exercise: 4 days a week -Educated on A1c and blood sugar  goals; Exercise goal of 150 minutes per week; Carbohydrate counting and/or plate method -Counseled to check feet daily and get yearly  eye exams -Counseled on diet and exercise extensively Recommended to discontinue Januvia and increase Rybelsus  BPH (Goal: minimize symptoms) -Controlled -Current treatment  Silodosin 8 mg 1 capsule daily at bedtime -Medications previously tried: none  -Recommended to continue current medication  Glaucoma (Goal: minimize intraocular pressure) -Controlled -Current treatment  Travatan Z 0.004% apply as directed -Medications previously tried: none  -Recommended to continue current medication   Health Maintenance -Vaccine gaps: shingrix (patient got at the [place]), tetanus -Current therapy:  Vitamin D 2000 units 1 tablet daily Vitamin B12 1000 mcg 1 tablet daily -Educated on Cost vs benefit of each product must be carefully weighed by individual consumer -Patient is satisfied with current therapy and denies issues -Recommended to continue current medication  Patient Goals/Self-Care Activities Patient will:  - take medications as prescribed check glucose daily, document, and provide at future appointments target a minimum of 150 minutes of moderate intensity exercise weekly  Follow Up Plan: The care management team will reach out to the patient again over the next 30 days.        Medication Assistance: None required.  Patient affirms current coverage meets needs.  Compliance/Adherence/Medication fill history: Care Gaps: Shingrix, tetanus, foot exam  Star-Rating Drugs: Rybelsus 3 mg - last filled 04/29/21 for 30 ds at [facility] Pravastatin 20 mg - last filled 02/22/21 for 90 ds at [facility] Metformin 750 mg - last filled 02/22/21 for 90 ds at [facility] 100 mg - last filled 02/22/21 for 90 ds at [facility]  Patient's preferred pharmacy is:  [facility] [place] [facility], [address] AT [address] [place] [place] [address] Phone: [phone] Fax: [phone]  Uses pill box? Yes - has an AM and PM - bought a box but some are before and some are after Pt endorses 100% compliance  We discussed: Benefits of medication synchronization, packaging and delivery as well as enhanced pharmacist oversight with [facility]. Patient decided to: Continue current medication management strategy  Care Plan and Follow Up Patient Decision:  Patient agrees to Care Plan and Follow-up.  Plan: The care management team will reach out to the patient again over the next 30 days.  [name], PharmD, [place] Pharmacist [place] [facility] [place]

## 2021-05-23 NOTE — Patient Instructions (Addendum)
Hi Robert Warner,  It was great to get to meet you in person! Below is a summary of some of the topics we discussed.   Please reach out to me if you have any questions or need anything before our follow up!  Best, Maddie  [name], PharmD, [place] [facility] [place]   Visit Information   Goals Addressed             This Visit's Progress    Manage My Medicine       Timeframe:  Short-Term Goal Priority:  Medium Start Date:                             Expected End Date:                       Follow Up Date 09/23/21    - keep a list of all the medicines I take; vitamins and herbals too - use a pillbox to sort medicine    Why is this important?   These steps will help you keep on track with your medicines.   Notes:         Patient Care Plan: CCM Pharmacy Care Plan     Problem Identified: Problem: Hyperlipidemia, Diabetes, BPH, and Glaucoma      Long-Range Goal: Patient-Specific Goal   Start Date: 05/23/2021  Expected End Date: 05/23/2022  This Visit's Progress: On track  Priority: High  Note:   Current Barriers:  Unable to independently monitor therapeutic efficacy Suboptimal therapeutic regimen for diabetes  Pharmacist Clinical Goal(s):  Patient will achieve adherence to monitoring guidelines and medication adherence to achieve therapeutic efficacy through collaboration with PharmD and provider.   Interventions: 1:1 collaboration with [name], MD regarding development and update of comprehensive plan of care as evidenced by provider attestation and co-signature Inter-disciplinary care team collaboration (see longitudinal plan of care) Comprehensive medication review performed; medication list updated in electronic medical record  Hyperlipidemia: (LDL goal < 100) -Controlled -Current treatment: Pravastatin 20 mg 1 tablet daily -Medications previously tried: none  -Current dietary patterns: eating a  vegetable with every meal; does not eat much fried foods -Current exercise habits: at least 4 days a week -Educated on Cholesterol goals;  Importance of limiting foods high in cholesterol; -Counseled on diet and exercise extensively Recommended to continue current medication  Diabetes (A1c goal <7%) -Controlled -Current medications: Rybelsus 3 mg 1 tablet daily Januvia 100 mg 1 tablet daily Metformin 750 mg XR 1 tablet daily with breakfast -Medications previously tried: metformin (higher dose of 500 mg twice daily caused stomach upset)  -Current home glucose readings fasting glucose: 90, 115, 100, 119, 80, 123, 127 post prandial glucose: does not check -Denies hypoglycemic/hyperglycemic symptoms -Current meal patterns:  breakfast: 2% milk and cereal (raisin bran or cheerios and bananas); grits & eggs & sausage and cheese on Sundays lunch: salad sometimes; [place] and ham sandwich, potato chips dinner: picks it up Tues and Fri; chicken; catfish with potatoes and vegetables; longhorn ribeye with baked potato and salad; vegetable with dinner snacks: very seldom dessert, peanuts, dried raisins (doesn't eat after dinner) drinks: water, Gatorade, iced tea (sweet) -Current exercise: 4 days a week -Educated on A1c and blood sugar goals; Exercise goal of 150 minutes per week; Carbohydrate counting and/or plate method -Counseled to check feet daily and get yearly eye exams -Counseled on diet and exercise extensively Recommended to  discontinue Januvia and increase Rybelsus  BPH (Goal: minimize symptoms) -Controlled -Current treatment  Silodosin 8 mg 1 capsule daily at bedtime -Medications previously tried: none  -Recommended to continue current medication  Glaucoma (Goal: minimize intraocular pressure) -Controlled -Current treatment  Travatan Z 0.004% apply as directed -Medications previously tried: none  -Recommended to continue current medication   Health Maintenance -Vaccine  gaps: shingrix (patient got at the [place]), tetanus -Current therapy:  Vitamin D 2000 units 1 tablet daily Vitamin B12 1000 mcg 1 tablet daily -Educated on Cost vs benefit of each product must be carefully weighed by individual consumer -Patient is satisfied with current therapy and denies issues -Recommended to continue current medication  Patient Goals/Self-Care Activities Patient will:  - take medications as prescribed check glucose daily, document, and provide at future appointments target a minimum of 150 minutes of moderate intensity exercise weekly  Follow Up Plan: The care management team will reach out to the patient again over the next 30 days.       Robert Warner was given information about Chronic Care Management services today including:  CCM service includes personalized support from designated clinical staff supervised by his physician, including individualized plan of care and coordination with other care providers 24/7 contact phone numbers for assistance for urgent and routine care needs. Standard insurance, coinsurance, copays and deductibles apply for chronic care management only during months in which we provide at least 20 minutes of these services. Most insurances cover these services at 100%, however patients may be responsible for any copay, coinsurance and/or deductible if applicable. This service may help you avoid the need for more expensive face-to-face services. Only one practitioner may furnish and bill the service in a calendar month. The patient may stop CCM services at any time (effective at the end of the month) by phone call to the office staff.  Patient agreed to services and verbal consent obtained.   Patient verbalizes understanding of instructions provided today and agrees to view in [place].  The pharmacy team will reach out to the patient again over the next 30 days.   Viona Gilmore, [facility]

## 2021-05-24 ENCOUNTER — Encounter: Payer: Self-pay | Admitting: Family Medicine

## 2021-05-25 ENCOUNTER — Telehealth: Payer: Self-pay | Admitting: Pharmacist

## 2021-05-25 NOTE — Telephone Encounter (Signed)
Called patient about medication changes from CCM visit and to follow up on [employer]. Patient is aware to finish up his supply of the Rybelsus 3 mg tablets and then start taking the 7 mg tablets and stop taking the Januvia.  Patient verbalized his understanding and is aware to call the office back with any questions/concerns.   Rescheduled PCP follow up for 3 months to recheck A1c after the change in DM medications.

## 2021-06-10 ENCOUNTER — Telehealth: Payer: Self-pay | Admitting: Family Medicine

## 2021-06-10 NOTE — Telephone Encounter (Signed)
Left message for patient to call back and schedule Medicare Annual Wellness Visit (AWV) either virtually or in office.   Last AWV ;06/30/20 please schedule at anytime with [facility] Nurse Health Advisor 1 or 2   This should be a 45 minute visit.  

## 2021-06-16 ENCOUNTER — Telehealth: Payer: Self-pay | Admitting: Pharmacist

## 2021-06-16 NOTE — Chronic Care Management (AMB) (Signed)
Chronic Care Management Pharmacy Assistant   Name: Robert Warner  MRN: [id number] DOB: 07-17-1941  Reason for Encounter: Disease State Hypertension Assessment Call   Conditions to be addressed/monitored: HTN  Recent office visits:  None  Recent consult visits:  None  Hospital visits:  None in previous 6 months  Medications: Outpatient Encounter Medications as of 06/16/2021  Medication Sig   Cholecalciferol (VITAMIN D3) 2000 UNITS TABS Take by mouth daily.   glucose blood (FREESTYLE LITE) test strip CHECK BLOOD SUGAR ONCE DAILY.  Dx:E11.9   Lancets (FREESTYLE) lancets Check blood sugar once daily   metFORMIN (GLUCOPHAGE-XR) 750 MG 24 hr tablet TAKE 1 TABLET(750 MG) BY MOUTH DAILY WITH BREAKFAST   pravastatin (PRAVACHOL) 20 MG tablet TAKE 1 TABLET(20 MG) BY MOUTH DAILY   Semaglutide (RYBELSUS) 7 MG TABS Take 7 mg by mouth daily.   silodosin (RAPAFLO) 8 MG CAPS capsule Take 8 mg by mouth at bedtime.   TRAVATAN Z 0.004 % SOLN ophthalmic solution Place 1 drop into both eyes.   vitamin B-12 (CYANOCOBALAMIN) 1000 MCG tablet Take 1,000 mcg by mouth daily.   No facility-administered encounter medications on file as of 06/16/2021.  Reviewed chart prior to disease state call. Spoke with patient regarding BP  Recent Office Vitals: BP Readings from Last 3 Encounters:  05/04/21 124/70  01/31/21 120/80  07/07/19 122/82   Pulse Readings from Last 3 Encounters:  05/04/21 80  01/31/21 77  07/07/19 68    Wt Readings from Last 3 Encounters:  05/04/21 209 lb 9.6 oz (95.1 kg)  01/31/21 204 lb 3 oz (92.6 kg)  07/07/19 208 lb 8 oz (94.6 kg)     Kidney Function Lab Results  Component Value Date/Time   CREATININE 1.18 01/31/2021 01:41 PM   CREATININE 1.23 07/07/2019 09:09 AM   GFR 57.78 (L) 01/31/2021 01:41 PM   GFRNONAA 105.53 08/22/2010 11:43 AM   GFRAA 95 06/15/2008 12:00 AM    BMP Latest Ref Rng & Units 01/31/2021 07/07/2019 05/27/2018  Glucose 70 - 99 mg/dL 126(H) 117(H)  107(H)  BUN 6 - 23 mg/dL '17 18 16  '$ Creatinine 0.40 - 1.50 mg/dL 1.18 1.23 1.29  Sodium 135 - 145 mEq/L 138 138 139  Potassium 3.5 - 5.1 mEq/L 4.5 4.8 4.6  Chloride 96 - 112 mEq/L 103 104 103  CO2 19 - 32 mEq/L '29 28 28  '$ Calcium 8.4 - 10.5 mg/dL 9.4 9.3 9.5    Recent Relevant Labs: Lab Results  Component Value Date/Time   HGBA1C 6.3 (A) 05/04/2021 07:27 AM   HGBA1C 7.6 (A) 01/31/2021 01:11 PM   HGBA1C 7.4 (H) 01/28/2018 09:13 AM   HGBA1C 7.4 07/04/2017 12:00 AM   HGBA1C 6.8 (H) 06/14/2016 09:53 AM   MICROALBUR <0.7 01/31/2021 01:41 PM   MICROALBUR <0.7 07/07/2019 09:09 AM    Kidney Function Lab Results  Component Value Date/Time   CREATININE 1.18 01/31/2021 01:41 PM   CREATININE 1.23 07/07/2019 09:09 AM   GFR 57.78 (L) 01/31/2021 01:41 PM   GFRNONAA 105.53 08/22/2010 11:43 AM   GFRAA 95 06/15/2008 12:00 AM    Current antihyperglycemic regimen:  Rybelsus 3 mg 1 tablet daily Metformin 750 mg XR 1 tablet daily with breakfast Semaglutide '7mg'$  once daily What recent interventions/DTPs have been made to improve glycemic control:  Patient recently started Rybelsus '3mg'$  daily Have there been any recent hospitalizations or ED visits since last visit with CPP? No Patient denies hypoglycemic symptoms, including None Patient denies hyperglycemic symptoms, including none  How often are you checking your blood sugar? Patient reports he is checking 3-4 times a week fasting and occassionally in the evening What are your blood sugars ranging?  Fasting: 8-4 (130) 8-3 (131) 8-2 (118) Before meals: Patient reports he does not check then After meals: Patient reports he does not check then Bedtime: Patient did not have a record bus reported it was good During the week, how often does your blood glucose drop below 70? Never Are you checking your feet daily/regularly? Patient reports yes he checks them no sores or cuts present. Notes:   Patient advised after switching to Rybelsus he did  experience some side effects. For three days he had dull pains in his stomach similar to gas pain no diarrhea, vomiting or constipation. He reports after 3 days the pain decreased drastically and now is present but tolerable feels just tender now.   Adherence Review: Is the patient currently on a STATIN medication? Yes Is the patient currently on ACE/ARB medication? No Does the patient have >5 day gap between last estimated fill dates? No    Care Gaps:  Zoster Vaccine - Overdue TDAP - Overdue Foot Exam - Overdue Flu Vaccine - Overdue CCM F/U phone call - 10-03-21 10 am AWV - MSG sent to Ramond Craver CMA to schedule.  Star Rating Drugs:  Metformin 750 mg - Last filled 02-22-2021 90 DS at [facility] Januvia 100 mg - Last filled 02-25-2021 90 DS at [facility] Pravastatin 20 mg - Last filled 02-22-2021 90 DS at [facility] '7mg'$  - Last filled 04-29-2021 30 DS at [place] Pharmacist Assistant [phone]

## 2021-06-22 ENCOUNTER — Other Ambulatory Visit: Payer: Self-pay | Admitting: Family Medicine

## 2021-06-25 ENCOUNTER — Encounter: Payer: Self-pay | Admitting: Family Medicine

## 2021-06-27 NOTE — Telephone Encounter (Signed)
Robert Warner has requested I send this message to her. Message has been routed.

## 2021-06-29 ENCOUNTER — Encounter: Payer: Self-pay | Admitting: Family Medicine

## 2021-07-07 ENCOUNTER — Telehealth: Payer: Self-pay | Admitting: Family Medicine

## 2021-07-07 NOTE — Telephone Encounter (Signed)
[facility] call and stated Pt refuse to go to the ER

## 2021-07-07 NOTE — Telephone Encounter (Signed)
Left message for patient to call back and schedule Medicare Annual Wellness Visit (AWV) either virtually or in office. Left  my jabber number [phone]   Last AWV 06/30/20  please schedule at anytime with [facility] Nurse Health Advisor 1 or 2   This should be a 45 minute visit.  

## 2021-07-07 NOTE — Telephone Encounter (Signed)
FYI

## 2021-07-11 ENCOUNTER — Ambulatory Visit (INDEPENDENT_AMBULATORY_CARE_PROVIDER_SITE_OTHER): Payer: Medicare HMO | Admitting: Family Medicine

## 2021-07-11 ENCOUNTER — Other Ambulatory Visit: Payer: Self-pay

## 2021-07-11 VITALS — BP 120/80 | HR 87 | Temp 98.1°F | Wt 206.5 lb

## 2021-07-11 DIAGNOSIS — K9289 Other specified diseases of the digestive system: Secondary | ICD-10-CM | POA: Diagnosis not present

## 2021-07-11 DIAGNOSIS — E119 Type 2 diabetes mellitus without complications: Secondary | ICD-10-CM | POA: Diagnosis not present

## 2021-07-11 DIAGNOSIS — R1013 Epigastric pain: Secondary | ICD-10-CM | POA: Diagnosis not present

## 2021-07-11 DIAGNOSIS — R160 Hepatomegaly, not elsewhere classified: Secondary | ICD-10-CM

## 2021-07-11 NOTE — Progress Notes (Signed)
Established Patient Office Visit  Subjective:  Patient ID: Robert Warner, male    DOB: 1941-10-11  Age: 80 y.o. MRN: [id number]  CC:  Chief Complaint  Patient presents with   Medication Problem    Pt complains of gas/bloating before bed, not every night, thinks this is due to one of his medications     HPI Robert Warner presents for follow-up regarding some recent dyspepsia intermittently.  He takes Rybelsus and around 14 July dosage was increased to 7 mg.  He sent in a note through my chart stating that he did okay for the first few days but did have some upper abdominal pains with gas-like symptoms worse at night when he goes to bed.  Blood sugars have improved dramatically and he rarely gets blood sugars over 120 now.  He states his symptoms occur intermittently about every 3 to 4 days and not consistently.  Ginger ale helps.  No vomiting.  Pains are somewhat migratory and he suspects these are gas-like pains.  Sometimes feels bloated after eating.  No right upper quadrant pain.  Recent A1c had improved from 7.6% to 6.3%.  He has had some modest weight loss since starting Rybelsus and he is very pleased with that.  He has good appetite.  No fever.  No stool changes.  One of his main concerns that he had a brother that died of pancreatic cancer.  Again, patient does not have any appetite changes and no consistent symptoms of epigastric pain.  He also was aware of cautions with pancreatitis history of GLP-1 medications but does not describe any acute pancreatitis symptoms.  Past Medical History:  Diagnosis Date   Arthritis    Diabetes mellitus    Hyperlipidemia    Hypertension     Past Surgical History:  Procedure Laterality Date   APPENDECTOMY  1968   CATARACT EXTRACTION Left 07/2000   states 2 weeks ago (first of sept) OD due in Dec    COLONOSCOPY  2004,2009   Negative, Dr. Sharlett Iles    KIDNEY STONE SURGERY  10/2018   PROSTATE BIOPSY  2006   Dr.Sigmund Tannebaum     Family History  Problem Relation Age of Onset   Stroke Father 32   Hypertension Mother    Coronary artery disease Mother    Diabetes Maternal Aunt    Pancreatic cancer Brother    Stroke Paternal Grandmother    Breast cancer Paternal Aunt    Coronary artery disease Paternal Aunt    Coronary artery disease Maternal Grandmother    Coronary artery disease Maternal Uncle        2 Maternal Uncles    Diabetes Maternal Uncle    Stroke Sister    Heart disease Neg Hx     Social History   Socioeconomic History   Marital status: Married    Spouse name: Not on file   Number of children: 2   Years of education: Not on file   Highest education level: Not on file  Occupational History   Occupation: retired Airline pilot  Tobacco Use   Smoking status: Former    Packs/day: 0.50    Years: 10.00    Pack years: 5.00    Types: Cigarettes    Quit date: 11/13/1966    Years since quitting: 51.6   Smokeless tobacco: Never  Vaping Use   Vaping Use: Never used  Substance and Sexual Activity   Alcohol use: Yes    Alcohol/week: 2.0 standard drinks  Types: 2 Glasses of wine per week    Comment: Occasional wine or beer   Drug use: No   Sexual activity: Not on file  Other Topics Concern   Not on file  Social History Narrative   Not on file   Social Determinants of Health   Financial Resource Strain: Low Risk    Difficulty of Paying Living Expenses: Not hard at all  Food Insecurity: Not on file  Transportation Needs: No Transportation Needs   Lack of Transportation (Medical): No   Lack of Transportation (Non-Medical): No  Physical Activity: Not on file  Stress: Not on file  Social Connections: Not on file  Intimate Partner Violence: Not on file    Outpatient Medications Prior to Visit  Medication Sig Dispense Refill   Cholecalciferol (VITAMIN D3) 2000 UNITS TABS Take by mouth daily.     glucose blood (FREESTYLE LITE) test strip CHECK BLOOD SUGAR ONCE DAILY.  Dx:E11.9 100 each 3    Lancets (FREESTYLE) lancets Check blood sugar once daily 100 each 3   metFORMIN (GLUCOPHAGE-XR) 750 MG 24 hr tablet TAKE 1 TABLET(750 MG) BY MOUTH DAILY WITH BREAKFAST 90 tablet 0   pravastatin (PRAVACHOL) 20 MG tablet TAKE 1 TABLET(20 MG) BY MOUTH DAILY 90 tablet 0   Semaglutide (RYBELSUS) 7 MG TABS Take 7 mg by mouth daily. 90 tablet 3   silodosin (RAPAFLO) 8 MG CAPS capsule Take 8 mg by mouth at bedtime.     TRAVATAN Z 0.004 % SOLN ophthalmic solution Place 1 drop into both eyes.     vitamin B-12 (CYANOCOBALAMIN) 1000 MCG tablet Take 1,000 mcg by mouth daily.     No facility-administered medications prior to visit.    No Known Allergies  ROS Review of Systems  Constitutional:  Negative for appetite change, fever and unexpected weight change.  Respiratory:  Negative for shortness of breath.   Cardiovascular:  Negative for chest pain.  Gastrointestinal:  Positive for abdominal pain. Negative for blood in stool, constipation, diarrhea, nausea and vomiting.  Genitourinary:  Negative for dysuria.     Objective:    Physical Exam Vitals reviewed.  Constitutional:      Appearance: Normal appearance.  Cardiovascular:     Rate and Rhythm: Normal rate and regular rhythm.  Pulmonary:     Effort: Pulmonary effort is normal.     Breath sounds: Normal breath sounds.  Abdominal:     Palpations: Abdomen is soft.     Tenderness: There is no abdominal tenderness.  Neurological:     Mental Status: He is alert.    BP 120/80 (BP Location: Left Arm, Patient Position: Sitting, Cuff Size: Normal)   Pulse 87   Temp 98.1 F (36.7 C) (Oral)   Wt 206 lb 8 oz (93.7 kg)   SpO2 98%   BMI 29.63 kg/m  Wt Readings from Last 3 Encounters:  07/11/21 206 lb 8 oz (93.7 kg)  05/04/21 209 lb 9.6 oz (95.1 kg)  01/31/21 204 lb 3 oz (92.6 kg)     Health Maintenance Due  Topic Date Due   Zoster Vaccines- Shingrix (1 of 2) Never done   TETANUS/TDAP  06/15/2018   FOOT EXAM  07/06/2020   INFLUENZA  VACCINE  06/13/2021    There are no preventive care reminders to display for this patient.  Lab Results  Component Value Date   TSH 1.25 10/15/2013   Lab Results  Component Value Date   WBC 4.6 08/22/2010   HGB 14.8 08/22/2010  HCT 44.7 08/22/2010   MCV 87.4 08/22/2010   PLT 144.0 (L) 08/22/2010   Lab Results  Component Value Date   NA 138 01/31/2021   K 4.5 01/31/2021   CO2 29 01/31/2021   GLUCOSE 126 (H) 01/31/2021   BUN 17 01/31/2021   CREATININE 1.18 01/31/2021   BILITOT 0.7 01/31/2021   ALKPHOS 63 01/31/2021   AST 23 01/31/2021   ALT 16 01/31/2021   PROT 6.9 01/31/2021   ALBUMIN 4.1 01/31/2021   CALCIUM 9.4 01/31/2021   GFR 57.78 (L) 01/31/2021   Lab Results  Component Value Date   CHOL 148 01/31/2021   Lab Results  Component Value Date   HDL 50.10 01/31/2021   Lab Results  Component Value Date   LDLCALC 78 01/31/2021   Lab Results  Component Value Date   TRIG 102.0 01/31/2021   Lab Results  Component Value Date   CHOLHDL 3 01/31/2021   Lab Results  Component Value Date   HGBA1C 6.3 (A) 05/04/2021      Assessment & Plan:   #1 type 2 diabetes improved control with addition recently of GLP-1 medication. He did have some mild nausea when starting Rybelsus but feels that he is adapting to that. -Continue metformin extended release and Rybelsus 7 mg daily -Reassess A1c in a couple months at follow-up  #2 recent dyspepsia/increased gaseous symptoms.  Not clear these are related to his medication anyway.  These symptoms tend to be occurring more at night -We discussed low FODMAP diet -Discussed signs and symptoms of more worrisome abdominal pain    Follow-up: No follow-ups on file.    [name], MD

## 2021-07-27 ENCOUNTER — Telehealth: Payer: Self-pay | Admitting: Family Medicine

## 2021-07-27 ENCOUNTER — Ambulatory Visit: Payer: Medicare HMO

## 2021-07-27 NOTE — Telephone Encounter (Signed)
Left message asking pt to call [phone] Please r/s 07/27/21 awv appointment laura will be out of office

## 2021-07-30 ENCOUNTER — Encounter: Payer: Self-pay | Admitting: Family Medicine

## 2021-07-30 DIAGNOSIS — R1010 Upper abdominal pain, unspecified: Secondary | ICD-10-CM

## 2021-08-02 NOTE — Telephone Encounter (Signed)
LVM instructions for patient to call back. Need to know where to send u/s to.

## 2021-08-02 NOTE — Telephone Encounter (Signed)
Received message that pt returned call & stated that he wanted u/s done in [place]. Will place order based on PCP note with dx.

## 2021-08-02 NOTE — Telephone Encounter (Signed)
I have ordered complete abdominal ultrasound.  Let him know.  Thanks.

## 2021-08-02 NOTE — Addendum Note (Signed)
Addended by: [name] on: 08/02/2021 07:58 PM   Modules accepted: Orders

## 2021-08-03 NOTE — Telephone Encounter (Signed)
Pt states appt has been made. Advised that PCP will be in touch with results. Pt thankful & verb understanding.

## 2021-08-05 ENCOUNTER — Ambulatory Visit
Admission: RE | Admit: 2021-08-05 | Discharge: 2021-08-05 | Disposition: A | Discharge status: Home / Self Care | Payer: Medicare HMO | Source: Ambulatory Visit | Attending: Family Medicine | Admitting: Family Medicine

## 2021-08-05 DIAGNOSIS — R1010 Upper abdominal pain, unspecified: Secondary | ICD-10-CM | POA: Diagnosis not present

## 2021-08-05 DIAGNOSIS — K7689 Other specified diseases of liver: Secondary | ICD-10-CM | POA: Diagnosis not present

## 2021-08-05 IMAGING — US US ABDOMEN COMPLETE
1 series · 13 of 25 positions shown · non-contrast
Comparison: None.

CLINICAL DATA: Recurrent upper abdominal pain

EXAM:
ABDOMEN ULTRASOUND COMPLETE

[Series 1: us abdomen complete · 0.19mm/px · 13 of 100 slices shown]
[im 1/100]
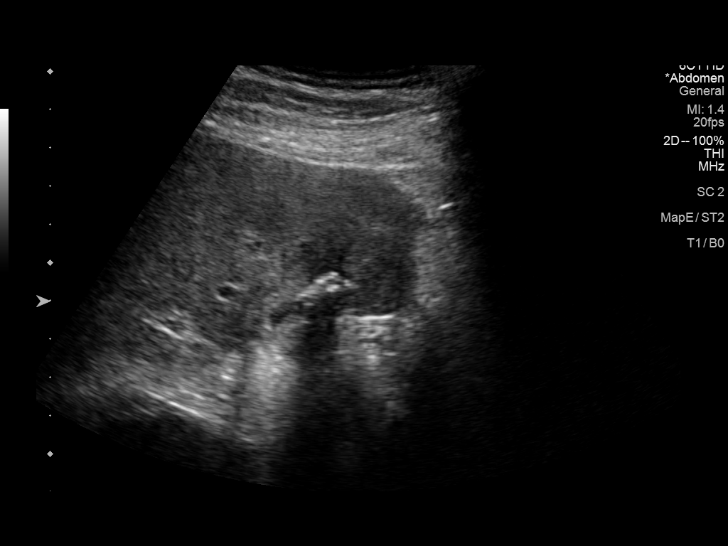
[im 9/100]
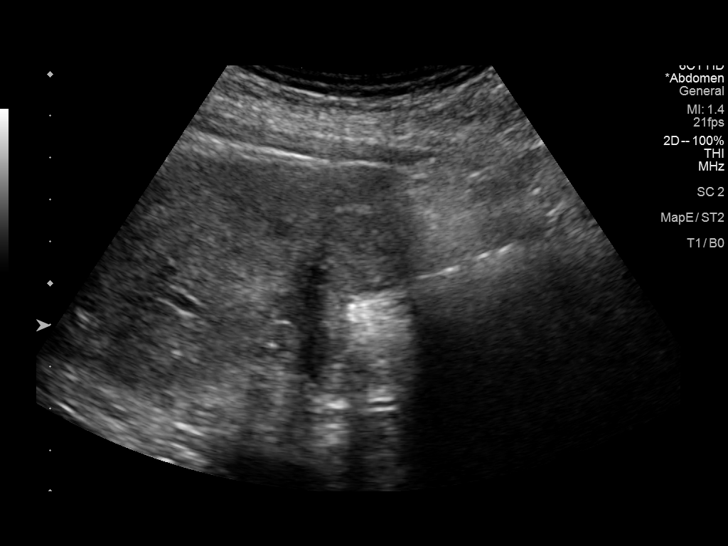
[im 17/100]
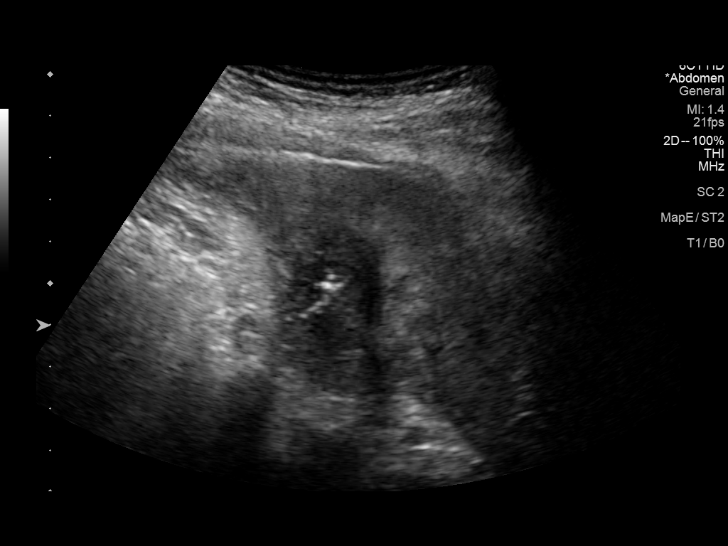
[im 25/100]
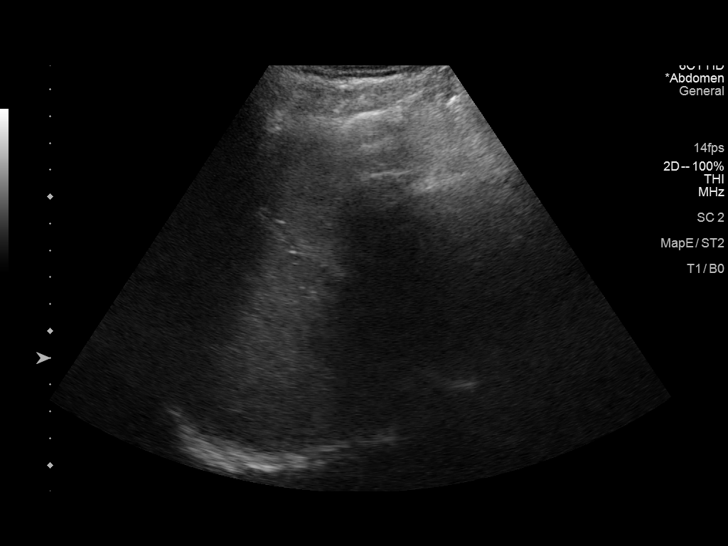
[im 34/100]
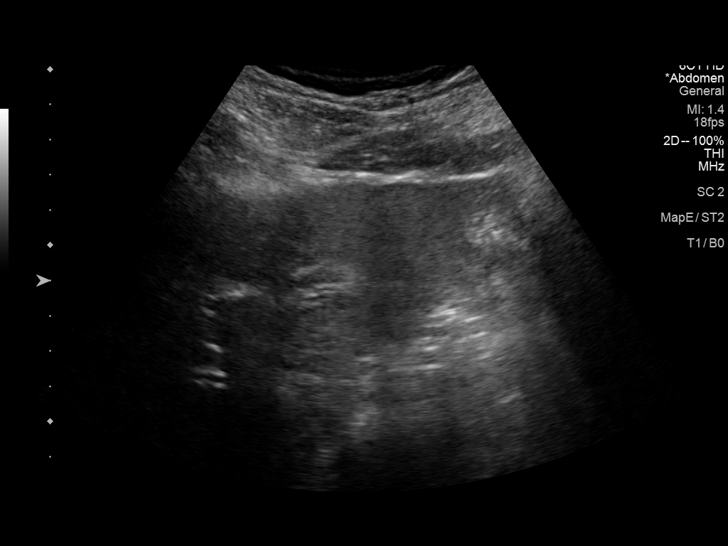
[im 42/100]
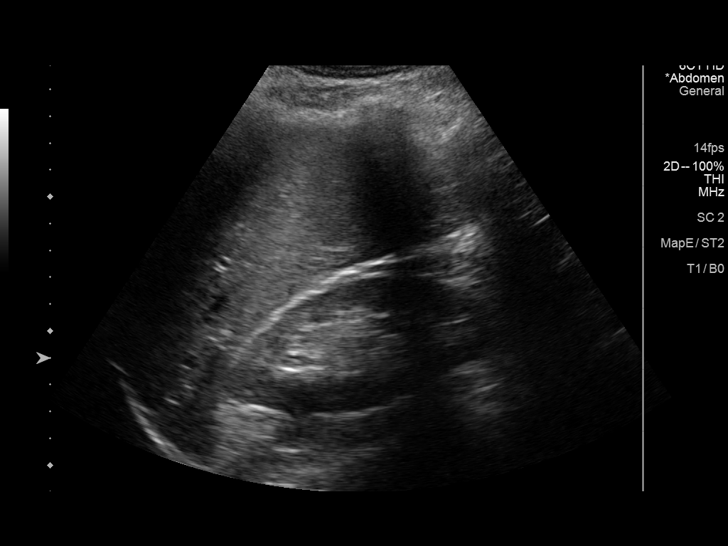
[im 50/100]
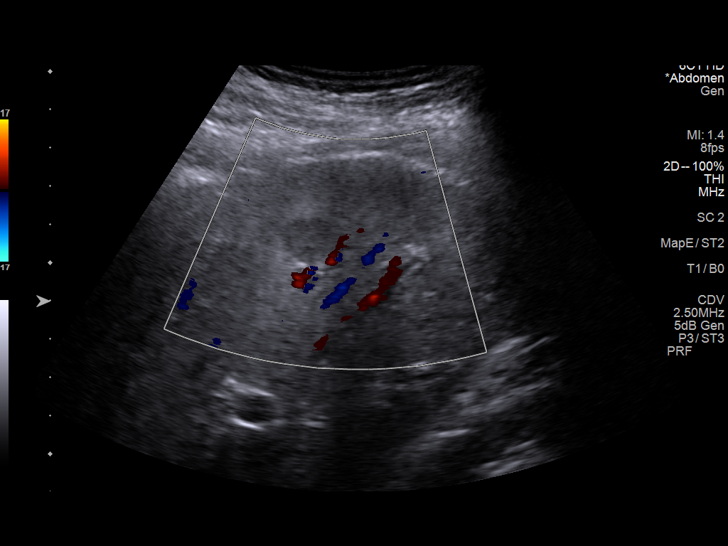
[im 58/100]
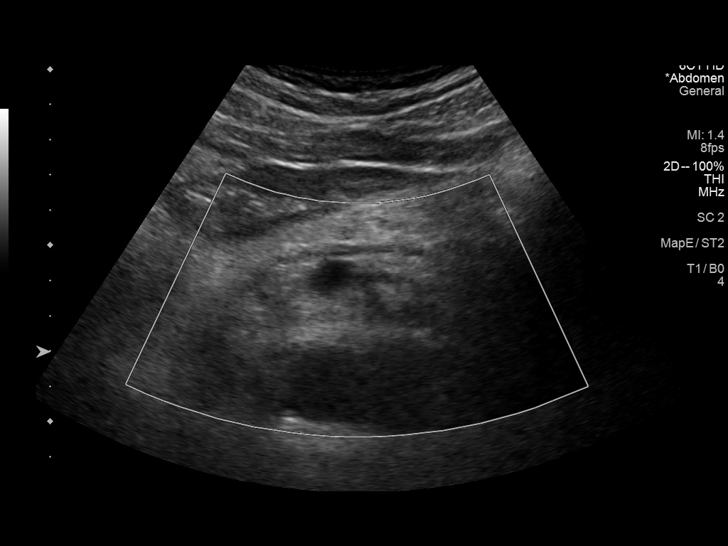
[im 67/100]
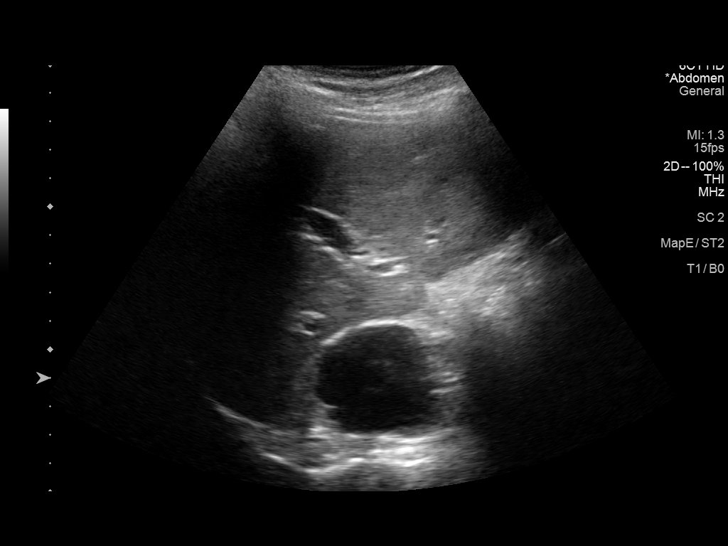
[im 75/100]
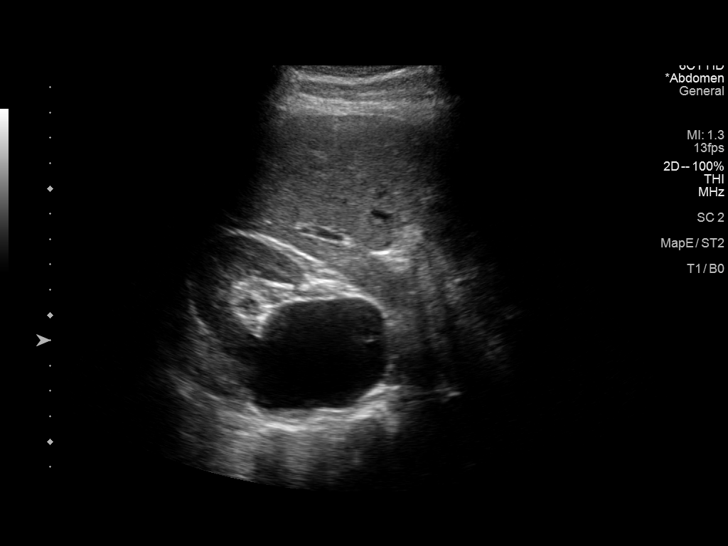
[im 83/100]
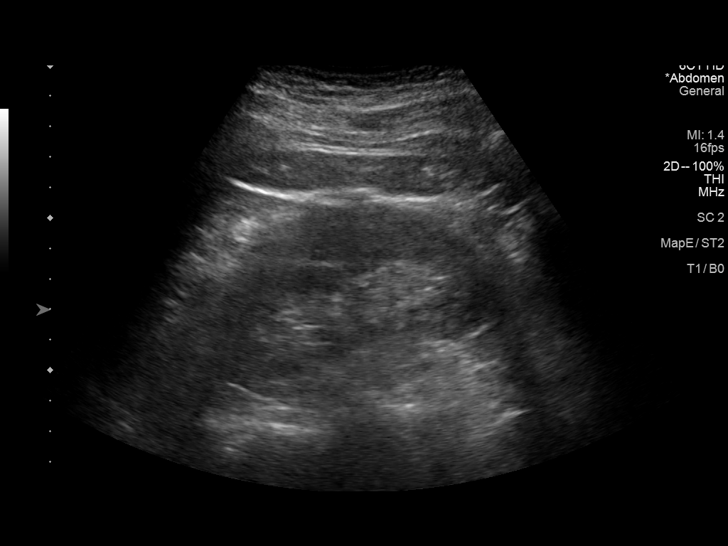
[im 91/100]
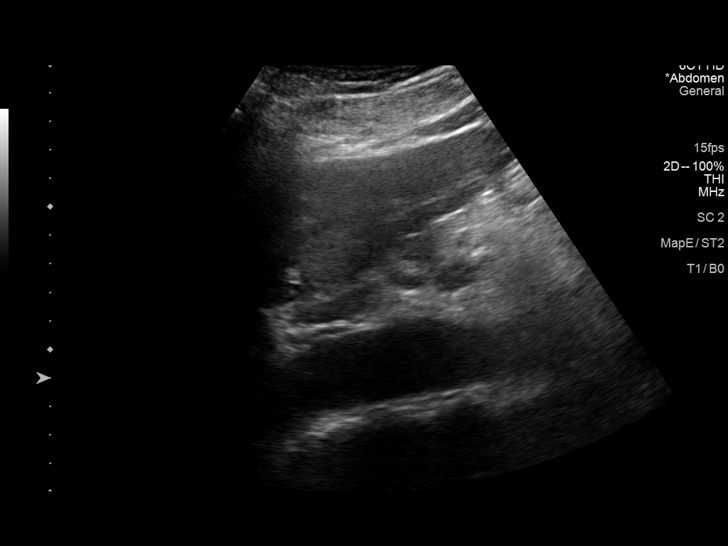
[im 100/100]
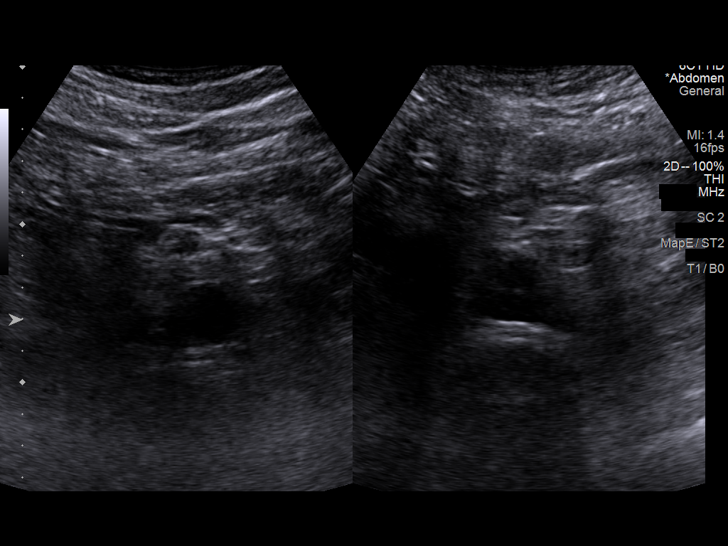

[13 of 25 positions shown; findings below may reference images not displayed]

FINDINGS: Gallbladder: Contracted. Contains sludge and stones. Normal wall
thickness. Negative sonographic Murphy

Common bile duct: Diameter: 5 mm

Liver: Echogenicity within normal limits. Hypoechoic solid mass in
the right hepatic lobe measuring 14 x 11 by 16 mm portal vein is
patent on color Doppler imaging with normal direction of blood flow
towards the liver.

IVC: No abnormality visualized.

Pancreas: Visualized portion unremarkable.

Spleen: Size and appearance within normal limits.

Right Kidney: Length: 10.8 cm. Echogenicity within normal limits. No
hydronephrosis. Cyst at the upper pole measuring 5.6 x 2.8 x 4.3 cm

Left Kidney: Length: 10.4 cm. Echogenicity within normal limits. No
mass or hydronephrosis visualized.

Abdominal aorta: No aneurysm visualized.

Other findings: Maximum aortic diameter of 3.0 cm
IMPRESSION: 1. Contracted gallbladder containing sludge and stones. Negative for
acute gallbladder disease.
2. 16 mm indeterminate solid mass in the right hepatic lobe. Suggest
MRI for further evaluation.
3. Maximum abdominal aortic diameter of 3 cm. Recommend follow-up
ultrasound every 3 years. This recommendation follows ACR consensus
guidelines: White Paper of the ACR Incidental Findings Committee II
on Vascular Findings. [HOSPITAL] [FO]; [DATE].

These results will be called to the ordering clinician or
representative by the Radiologist Assistant, and communication
documented in the PACS or [REDACTED].

## 2021-08-08 ENCOUNTER — Telehealth: Payer: Self-pay

## 2021-08-08 DIAGNOSIS — K769 Liver disease, unspecified: Secondary | ICD-10-CM

## 2021-08-08 NOTE — Telephone Encounter (Signed)
Spoke with the patient. He is aware of Dr. [name] message and is aware he will be receiving the call to set up the MRI in the next few days. Nothing further needed.

## 2021-08-08 NOTE — Telephone Encounter (Signed)
I reviewed the [place] report. He has a small dilation of the abdominal aorta and he should have a 3 year follow up US. There is also a mass in the liver but we cannot tell what type. I have ordered an abdominal MRI scan to get more information on this

## 2021-08-08 NOTE — Addendum Note (Signed)
Addended by: Alysia Penna A on: 08/08/2021 12:55 PM   Modules accepted: Orders

## 2021-08-08 NOTE — Telephone Encounter (Signed)
In with [facility] called in with a report stating, [place] of Gallbladder containing Sludge and stool. Negative for acute gallbladder diseases.  16 MM mass, Right hip-recommends MRI for further evaluation. Abdominal aortic mass, 3cm in diameter- recommends follow up US every 3 years.   Please review in Dr. Erick Blinks absence.

## 2021-08-10 ENCOUNTER — Ambulatory Visit (INDEPENDENT_AMBULATORY_CARE_PROVIDER_SITE_OTHER): Payer: Medicare HMO

## 2021-08-10 ENCOUNTER — Other Ambulatory Visit: Payer: Self-pay

## 2021-08-10 VITALS — BP 128/72 | HR 72 | Temp 98.3°F | Ht 70.0 in | Wt 204.0 lb

## 2021-08-10 DIAGNOSIS — Z0000 Encounter for general adult medical examination without abnormal findings: Secondary | ICD-10-CM | POA: Diagnosis not present

## 2021-08-10 DIAGNOSIS — H401132 Primary open-angle glaucoma, bilateral, moderate stage: Secondary | ICD-10-CM | POA: Diagnosis not present

## 2021-08-10 LAB — HM DIABETES EYE EXAM

## 2021-08-10 NOTE — Progress Notes (Signed)
Subjective:   Robert Warner is a 80 y.o. male who presents for an Subsequent Medicare Annual Wellness Visit.  Review of Systems    N/a       Objective:    There were no vitals filed for this visit. There is no height or weight on file to calculate BMI.  Advanced Directives 06/30/2020 11/25/2018 08/01/2017  Does Patient Have a Medical Advance Directive? Yes Yes Yes  Type of Paramedic of [place];Living will [place];Living will -  Does patient want to make changes to medical advance directive? No - Patient declined No - Patient declined -  Copy of [place] in Chart? No - copy requested No - copy requested -    Current Medications (verified) Outpatient Encounter Medications as of 08/10/2021  Medication Sig   Cholecalciferol (VITAMIN D3) 2000 UNITS TABS Take by mouth daily.   glucose blood (FREESTYLE LITE) test strip CHECK BLOOD SUGAR ONCE DAILY.  Dx:E11.9   Lancets (FREESTYLE) lancets Check blood sugar once daily   metFORMIN (GLUCOPHAGE-XR) 750 MG 24 hr tablet TAKE 1 TABLET(750 MG) BY MOUTH DAILY WITH BREAKFAST   pravastatin (PRAVACHOL) 20 MG tablet TAKE 1 TABLET(20 MG) BY MOUTH DAILY   Semaglutide (RYBELSUS) 7 MG TABS Take 7 mg by mouth daily.   silodosin (RAPAFLO) 8 MG CAPS capsule Take 8 mg by mouth at bedtime.   TRAVATAN Z 0.004 % SOLN ophthalmic solution Place 1 drop into both eyes.   vitamin B-12 (CYANOCOBALAMIN) 1000 MCG tablet Take 1,000 mcg by mouth daily.   No facility-administered encounter medications on file as of 08/10/2021.    Allergies (verified) Patient has no known allergies.   History: Past Medical History:  Diagnosis Date   Arthritis    Diabetes mellitus    Hyperlipidemia    Hypertension    Past Surgical History:  Procedure Laterality Date   APPENDECTOMY  1968   CATARACT EXTRACTION Left 07/2000   states 2 weeks ago (first of sept) OD due in Dec    COLONOSCOPY  2004,2009   Negative,  Robert Warner    KIDNEY STONE SURGERY  10/2018   PROSTATE BIOPSY  2006   Robert Warner   Family History  Problem Relation Age of Onset   Stroke Father 41   Hypertension Mother    Coronary artery disease Mother    Diabetes Maternal Aunt    Pancreatic cancer Brother    Stroke Paternal Grandmother    Breast cancer Paternal Aunt    Coronary artery disease Paternal Aunt    Coronary artery disease Maternal Grandmother    Coronary artery disease Maternal Uncle        2 Maternal Uncles    Diabetes Maternal Uncle    Stroke Sister    Heart disease Neg Hx    Social History   Socioeconomic History   Marital status: Married    Spouse name: Not on file   Number of children: 2   Years of education: Not on file   Highest education level: Not on file  Occupational History   Occupation: retired Airline pilot  Tobacco Use   Smoking status: Former    Packs/day: 0.50    Years: 10.00    Pack years: 5.00    Types: Cigarettes    Quit date: 11/13/1966    Years since quitting: 54.7   Smokeless tobacco: Never  Vaping Use   Vaping Use: Never used  Substance and Sexual Activity   Alcohol use:  Yes    Alcohol/week: 2.0 standard drinks    Types: 2 Glasses of wine per week    Comment: Occasional wine or beer   Drug use: No   Sexual activity: Not on file  Other Topics Concern   Not on file  Social History Narrative   Not on file   Social Determinants of Health   Financial Resource Strain: Low Risk    Difficulty of Paying Living Expenses: Not hard at all  Food Insecurity: Not on file  Transportation Needs: No Transportation Needs   Lack of Transportation (Medical): No   Lack of Transportation (Non-Medical): No  Physical Activity: Not on file  Stress: Not on file  Social Connections: Not on file    Tobacco Counseling Counseling given: Not Answered   Clinical Intake:                 Diabetic yes  Nutrition Risk Assessment:  Has the patient had any N/V/D within the  last 2 months?  No  Does the patient have any non-healing wounds?  No  Has the patient had any unintentional weight loss or weight gain?  No   Diabetes:  Is the patient diabetic?  Yes  If diabetic, was a CBG obtained today?  No  Did the patient bring in their glucometer from home?  No  How often do you monitor your CBG's? Daily .   Financial Strains and Diabetes Management:  Are you having any financial strains with the device, your supplies or your medication? No .  Does the patient want to be seen by Chronic Care Management for management of their diabetes?  No  Would the patient like to be referred to a Nutritionist or for Diabetic Management?  No   Diabetic Exams:  Diabetic Eye Exam: Completed 08/10/2021 Diabetic Foot Exam: Overdue, Pt has been advised about the importance in completing this exam. Pt is scheduled for diabetic foot exam on next office visit .          Activities of Daily Living No flowsheet data found.  Patient Care Team: [name], MD as PCP - General (Family Medicine) [name], MD as Consulting Physician (Ophthalmology) Rosemary Holms, [place] as Consulting Physician (Podiatry) Minor, Orlie Pollen, DDS (Dentistry) Armbruster, Carlota Raspberry, MD as Consulting Physician (Gastroenterology) Festus Aloe, MD as Consulting Physician (Urology) Viona Gilmore, [facility] as Pharmacist (Pharmacist)  Indicate any recent Medical Services you may have received from other than [facility] providers in the past year (date may be approximate).     Assessment:   This is a routine wellness examination for Robert Warner.  Hearing/Vision screen No results found.  Dietary issues and exercise activities discussed:     Goals Addressed   None    Depression Screen PHQ 2/9 Scores 06/30/2020 11/25/2018 11/25/2018 08/01/2017 06/07/2015 10/28/2014 04/29/2014  PHQ - 2 Score 0 0 0 0 0 0 0  PHQ- 9 Score 0 2 - - - - -    Fall Risk Fall Risk  06/30/2020 11/25/2018 11/25/2018  08/01/2017 07/14/2016  Falls in the past year? 0 0 0 No No  Comment - - - - Emmi Telephone Survey: data to providers prior to load  Number falls in past yr: 0 - - - -  Injury with Fall? 0 - - - -  Risk for fall due to : No Fall Risks - - - -  Follow up Falls evaluation completed;Falls prevention discussed - - - -    FALL RISK PREVENTION PERTAINING  TO THE HOME:  Any stairs in or around the home? Yes  If so, are there any without handrails? No  Home free of loose throw rugs in walkways, pet beds, electrical cords, etc? Yes  Adequate lighting in your home to reduce risk of falls? Yes   ASSISTIVE DEVICES UTILIZED TO PREVENT FALLS:  Life alert? No  Use of a cane, walker or w/c? No  Grab bars in the bathroom? Yes  Shower chair or bench in shower? Yes  Elevated toilet seat or a handicapped toilet? Yes    Cognitive Function:  Normal cognitive status assessed by direct observation by this Nurse Health Advisor. No abnormalities found.     6CIT Screen 06/30/2020  What Year? 0 points  What month? 0 points  What time? 0 points  Count back from 20 0 points  Months in reverse 0 points  Repeat phrase 0 points  Total Score 0    Immunizations Immunization History  Administered Date(s) Administered   Fluad Quad(high Dose 65+) 07/07/2019, 08/25/2020   Influenza Whole 08/02/2009, 08/13/2012   Influenza, High Dose Seasonal PF 08/04/2013, 07/14/2016, 08/01/2017   Influenza,inj,Quad PF,6+ Mos 07/31/2014   PFIZER Comirnaty(Gray Top)Covid-19 Tri-Sucrose Vaccine 03/09/2021   PFIZER(Purple Top)SARS-COV-2 Vaccination 11/26/2019, 12/17/2019, 08/17/2020   Pneumococcal Conjugate-13 06/07/2015   Pneumococcal Polysaccharide-23 10/15/2013   Td 06/15/2008   Zoster, Live 07/31/2014    TDAP status: Due, Education has been provided regarding the importance of this vaccine. Advised may receive this vaccine at local pharmacy or Health Dept. Aware to provide a copy of the vaccination record if obtained from  local pharmacy or Health Dept. Verbalized acceptance and understanding.  Flu Vaccine status: Up to date  Pneumococcal vaccine status: Up to date  Covid-19 vaccine status: Completed vaccines  Qualifies for Shingles Vaccine? Yes   Zostavax completed No   Shingrix Completed?: No.    Education has been provided regarding the importance of this vaccine. Patient has been advised to call insurance company to determine out of pocket expense if they have not yet received this vaccine. Advised may also receive vaccine at local pharmacy or Health Dept. Verbalized acceptance and understanding.  Screening Tests Health Maintenance  Topic Date Due   Zoster Vaccines- Shingrix (1 of 2) Never done   TETANUS/TDAP  06/15/2018   FOOT EXAM  07/06/2020   INFLUENZA VACCINE  06/13/2021   HEMOGLOBIN A1C  11/03/2021   OPHTHALMOLOGY EXAM  12/22/2021   URINE MICROALBUMIN  01/31/2022   COVID-19 Vaccine  Completed   HPV VACCINES  Aged Out    Health Maintenance  Health Maintenance Due  Topic Date Due   Zoster Vaccines- Shingrix (1 of 2) Never done   TETANUS/TDAP  06/15/2018   FOOT EXAM  07/06/2020   INFLUENZA VACCINE  06/13/2021    Colorectal cancer screening: No longer required.   Lung Cancer Screening: (Low Dose CT Chest recommended if Age 63-80 years, 30 pack-year currently smoking OR have quit w/in 15years.) does not qualify.   Lung Cancer Screening Referral: n/a  Additional Screening:  Hepatitis C Screening: does not qualify;   Vision Screening: Recommended annual ophthalmology exams for early detection of glaucoma and other disorders of the eye. Is the patient up to date with their annual eye exam?  Yes  Who is the provider or what is the name of the office in which the patient attends annual eye exams? [facility] If pt is not established with a provider, would they like to be referred to a provider to establish  care? No .   Dental Screening: Recommended annual dental exams for proper oral  hygiene  Community Resource Referral / Chronic Care Management: CRR required this visit?  No   CCM required this visit?  No      Plan:     I have personally reviewed and noted the following in the patient's chart:   Medical and social history Use of alcohol, tobacco or illicit drugs  Current medications and supplements including opioid prescriptions. Patient is not currently taking opioid prescriptions. Functional ability and status Nutritional status Physical activity Advanced directives List of other physicians Hospitalizations, surgeries, and ER visits in previous 12 months Vitals Screenings to include cognitive, depression, and falls Referrals and appointments  In addition, I have reviewed and discussed with patient certain preventive protocols, quality metrics, and best practice recommendations. A written personalized care plan for preventive services as well as general preventive health recommendations were provided to patient.     Randel Pigg, LPN   07/06/1752  None  Nurse Notes:

## 2021-08-10 NOTE — Patient Instructions (Signed)
Robert Warner , Thank you for taking time to come for your Medicare Wellness Visit. I appreciate your ongoing commitment to your health goals. Please review the following plan we discussed and let me know if I can assist you in the future.   Screening recommendations/referrals: Colonoscopy: no longer required  Recommended yearly ophthalmology/optometry visit for glaucoma screening and checkup Recommended yearly dental visit for hygiene and checkup  Vaccinations: Influenza vaccine: completed  Pneumococcal vaccine: completed series  Tdap vaccine: due with injury  Shingles vaccine: per patient completed     Advanced directives: will provided copies   Conditions/risks identified: none   Next appointment: none   Preventive Care 82 Years and Older, Male Preventive care refers to lifestyle choices and visits with your health care provider that can promote health and wellness. What does preventive care include? A yearly physical exam. This is also called an annual well check. Dental exams once or twice a year. Routine eye exams. Ask your health care provider how often you should have your eyes checked. Personal lifestyle choices, including: Daily care of your teeth and gums. Regular physical activity. Eating a healthy diet. Avoiding tobacco and drug use. Limiting alcohol use. Practicing safe sex. Taking low doses of aspirin every day. Taking vitamin and mineral supplements as recommended by your health care provider. What happens during an annual well check? The services and screenings done by your health care provider during your annual well check will depend on your age, overall health, lifestyle risk factors, and family history of disease. Counseling  Your health care provider may ask you questions about your: Alcohol use. Tobacco use. Drug use. Emotional well-being. Home and relationship well-being. Sexual activity. Eating habits. History of falls. Memory and ability to  understand (cognition). Work and work Statistician. Screening  You may have the following tests or measurements: Height, weight, and BMI. Blood pressure. Lipid and cholesterol levels. These may be checked every 5 years, or more frequently if you are over 41 years old. Skin check. Lung cancer screening. You may have this screening every year starting at age 82 if you have a 30-pack-year history of smoking and currently smoke or have quit within the past 15 years. Fecal occult blood test (FOBT) of the stool. You may have this test every year starting at age 82 Flexible sigmoidoscopy or colonoscopy. You may have a sigmoidoscopy every 5 years or a colonoscopy every 10 years starting at age 82 Prostate cancer screening. Recommendations will vary depending on your family history and other risks. Hepatitis C blood test. Hepatitis B blood test. Sexually transmitted disease (STD) testing. Diabetes screening. This is done by checking your blood sugar (glucose) after you have not eaten for a while (fasting). You may have this done every 1-3 years. Abdominal aortic aneurysm (AAA) screening. You may need this if you are a current or former smoker. Osteoporosis. You may be screened starting at age 82 if you are at high risk. Talk with your health care provider about your test results, treatment options, and if necessary, the need for more tests. Vaccines  Your health care provider may recommend certain vaccines, such as: Influenza vaccine. This is recommended every year. Tetanus, diphtheria, and acellular pertussis (Tdap, Td) vaccine. You may need a Td booster every 10 years. Zoster vaccine. You may need this after age 82 Pneumococcal 13-valent conjugate (PCV13) vaccine. One dose is recommended after age 82 Pneumococcal polysaccharide (PPSV23) vaccine. One dose is recommended after age 82 Talk to your health care provider about  which screenings and vaccines you need and how often you need them. This  information is not intended to replace advice given to you by your health care provider. Make sure you discuss any questions you have with your health care provider. Document Released: 11/26/2015 Document Revised: 07/19/2016 Document Reviewed: 08/31/2015 Elsevier Interactive Patient Education  2017 [employer] Prevention in the Home Falls can cause injuries. They can happen to people of all ages. There are many things you can do to make your home safe and to help prevent falls. What can I do on the outside of my home? Regularly fix the edges of walkways and driveways and fix any cracks. Remove anything that might make you trip as you walk through a door, such as a raised step or threshold. Trim any bushes or trees on the path to your home. Use bright outdoor lighting. Clear any walking paths of anything that might make someone trip, such as rocks or tools. Regularly check to see if handrails are loose or broken. Make sure that both sides of any steps have handrails. Any raised decks and porches should have guardrails on the edges. Have any leaves, snow, or ice cleared regularly. Use sand or salt on walking paths during winter. Clean up any spills in your garage right away. This includes oil or grease spills. What can I do in the bathroom? Use night lights. Install grab bars by the toilet and in the tub and shower. Do not use towel bars as grab bars. Use non-skid mats or decals in the tub or shower. If you need to sit down in the shower, use a plastic, non-slip stool. Keep the floor dry. Clean up any water that spills on the floor as soon as it happens. Remove soap buildup in the tub or shower regularly. Attach bath mats securely with double-sided non-slip rug tape. Do not have throw rugs and other things on the floor that can make you trip. What can I do in the bedroom? Use night lights. Make sure that you have a light by your bed that is easy to reach. Do not use any sheets or  blankets that are too big for your bed. They should not hang down onto the floor. Have a firm chair that has side arms. You can use this for support while you get dressed. Do not have throw rugs and other things on the floor that can make you trip. What can I do in the kitchen? Clean up any spills right away. Avoid walking on wet floors. Keep items that you use a lot in easy-to-reach places. If you need to reach something above you, use a strong step stool that has a grab bar. Keep electrical cords out of the way. Do not use floor polish or wax that makes floors slippery. If you must use wax, use non-skid floor wax. Do not have throw rugs and other things on the floor that can make you trip. What can I do with my stairs? Do not leave any items on the stairs. Make sure that there are handrails on both sides of the stairs and use them. Fix handrails that are broken or loose. Make sure that handrails are as long as the stairways. Check any carpeting to make sure that it is firmly attached to the stairs. Fix any carpet that is loose or worn. Avoid having throw rugs at the top or bottom of the stairs. If you do have throw rugs, attach them to the floor with  carpet tape. Make sure that you have a light switch at the top of the stairs and the bottom of the stairs. If you do not have them, ask someone to add them for you. What else can I do to help prevent falls? Wear shoes that: Do not have high heels. Have rubber bottoms. Are comfortable and fit you well. Are closed at the toe. Do not wear sandals. If you use a stepladder: Make sure that it is fully opened. Do not climb a closed stepladder. Make sure that both sides of the stepladder are locked into place. Ask someone to hold it for you, if possible. Clearly mark and make sure that you can see: Any grab bars or handrails. First and last steps. Where the edge of each step is. Use tools that help you move around (mobility aids) if they are  needed. These include: Canes. Walkers. Scooters. Crutches. Turn on the lights when you go into a dark area. Replace any light bulbs as soon as they burn out. Set up your furniture so you have a clear path. Avoid moving your furniture around. If any of your floors are uneven, fix them. If there are any pets around you, be aware of where they are. Review your medicines with your doctor. Some medicines can make you feel dizzy. This can increase your chance of falling. Ask your doctor what other things that you can do to help prevent falls. This information is not intended to replace advice given to you by your health care provider. Make sure you discuss any questions you have with your health care provider. Document Released: 08/26/2009 Document Revised: 04/06/2016 Document Reviewed: 12/04/2014 Elsevier Interactive Patient Education  2017 [employer].

## 2021-08-10 NOTE — Addendum Note (Signed)
Addended by: Alysia Penna A on: 08/10/2021 07:24 AM   Modules accepted: Orders

## 2021-08-17 ENCOUNTER — Telehealth: Payer: Self-pay | Admitting: Family Medicine

## 2021-08-17 NOTE — Telephone Encounter (Signed)
Spoke with Neoma Laming. She stated that she just sent this order over this morning and to have the patient call [phone] opt 1 then opt 3 so they can be screened.

## 2021-08-17 NOTE — Telephone Encounter (Signed)
Sent to [employer] for assistance.

## 2021-08-17 NOTE — Telephone Encounter (Signed)
Patient called saying that he was told to tell Ria Comment about an MRI. He says that he has not heard anything since last week about the MRI and wanted to make her aware.  The best contact number for the patient is [phone].  Please advise.

## 2021-08-17 NOTE — Telephone Encounter (Signed)
Spoke with the patient via mychart. See encounter from 08/17/2021.

## 2021-08-21 ENCOUNTER — Other Ambulatory Visit: Payer: Self-pay | Admitting: Family Medicine

## 2021-08-29 ENCOUNTER — Ambulatory Visit (INDEPENDENT_AMBULATORY_CARE_PROVIDER_SITE_OTHER): Payer: Medicare HMO | Admitting: Family Medicine

## 2021-08-29 ENCOUNTER — Other Ambulatory Visit: Payer: Self-pay

## 2021-08-29 VITALS — BP 128/70 | HR 83 | Temp 97.9°F | Ht 70.0 in | Wt 200.9 lb

## 2021-08-29 DIAGNOSIS — R160 Hepatomegaly, not elsewhere classified: Secondary | ICD-10-CM | POA: Diagnosis not present

## 2021-08-29 DIAGNOSIS — R1013 Epigastric pain: Secondary | ICD-10-CM

## 2021-08-29 DIAGNOSIS — R110 Nausea: Secondary | ICD-10-CM | POA: Diagnosis not present

## 2021-08-29 DIAGNOSIS — E119 Type 2 diabetes mellitus without complications: Secondary | ICD-10-CM | POA: Diagnosis not present

## 2021-08-29 DIAGNOSIS — R634 Abnormal weight loss: Secondary | ICD-10-CM | POA: Diagnosis not present

## 2021-08-29 LAB — POCT GLYCOSYLATED HEMOGLOBIN (HGB A1C): Hemoglobin A1C: 6.9 % — AB (ref 4.0–5.6)

## 2021-08-29 NOTE — Patient Instructions (Signed)
HOLD the Rybelsus for one week  IF nausea no better by end of week let me know  IF nausea resolves off Rybelsus, we will need to look at other alternatives such as Jardiance.    Would consider GI referral if symptoms not improved off Rybelsus.

## 2021-08-29 NOTE — Progress Notes (Signed)
Established Patient Office Visit  Subjective:  Patient ID: Robert Warner, male    DOB: 11-Jan-1941  Age: 80 y.o. MRN: [id number]  CC:  Chief Complaint  Patient presents with   Follow-up    F/u for DM   Abdominal Pain    Ongoing     HPI Robert Warner presents for ongoing dyspepsia and nausea.  Refer to prior note for details.  He has type 2 diabetes and is on Rybelsus 7 mg daily.  His onset of symptoms did roughly correlate with onset of starting Rybelsus.  He had recent ultrasound of abdomen which showed contracted gallbladder with sludge and stones but no acute gallbladder disease.  34mm indeterminate solid mass right hepatic lobe.  He has MRI scheduled for this Saturday to further assess.  Symptoms seem be worse after eating.  Low-grade nausea.  No vomiting.  No stool changes.  Some decrease in appetite.  Modest weight loss.  Weight is down 5 pounds from August.  Overall, he is pleased with the weight loss.  His blood sugars been fairly well controlled.  Only other diabetic drug is metformin extended release.  Previous intolerance with immediate release.  Denies any dysphagia or odynophagia.  Really no significant abdominal pain at this time.  Initially had some sharp upper abdominal pains  Past Medical History:  Diagnosis Date   Arthritis    Diabetes mellitus    Hyperlipidemia    Hypertension     Past Surgical History:  Procedure Laterality Date   APPENDECTOMY  1968   CATARACT EXTRACTION Left 07/2000   states 2 weeks ago (first of sept) OD due in Dec    COLONOSCOPY  2004,2009   Negative, Dr. Sharlett Iles    KIDNEY STONE SURGERY  10/2018   PROSTATE BIOPSY  2006   Dr.Sigmund Tannebaum    Family History  Problem Relation Age of Onset   Stroke Father 57   Hypertension Mother    Coronary artery disease Mother    Diabetes Maternal Aunt    Pancreatic cancer Brother    Stroke Paternal Grandmother    Breast cancer Paternal Aunt    Coronary artery disease Paternal Aunt     Coronary artery disease Maternal Grandmother    Coronary artery disease Maternal Uncle        2 Maternal Uncles    Diabetes Maternal Uncle    Stroke Sister    Heart disease Neg Hx     Social History   Socioeconomic History   Marital status: Married    Spouse name: Not on file   Number of children: 2   Years of education: Not on file   Highest education level: Not on file  Occupational History   Occupation: retired Airline pilot  Tobacco Use   Smoking status: Former    Packs/day: 0.50    Years: 10.00    Pack years: 5.00    Types: Cigarettes    Quit date: 11/13/1966    Years since quitting: 54.8   Smokeless tobacco: Never  Vaping Use   Vaping Use: Never used  Substance and Sexual Activity   Alcohol use: Yes    Alcohol/week: 2.0 standard drinks    Types: 2 Glasses of wine per week    Comment: Occasional wine or beer   Drug use: No   Sexual activity: Not on file  Other Topics Concern   Not on file  Social History Narrative   Not on file   Social Determinants of Health  Financial Resource Strain: Low Risk    Difficulty of Paying Living Expenses: Not hard at all  Food Insecurity: No Food Insecurity   Worried About Charity fundraiser in the Last Year: Never true   Ran Out of Food in the Last Year: Never true  Transportation Needs: No Transportation Needs   Lack of Transportation (Medical): No   Lack of Transportation (Non-Medical): No  Physical Activity: Sufficiently Active   Days of Exercise per Week: 3 days   Minutes of Exercise per Session: 60 min  Stress: No Stress Concern Present   Feeling of Stress : Not at all  Social Connections: Socially Integrated   Frequency of Communication with Friends and Family: Three times a week   Frequency of Social Gatherings with Friends and Family: Three times a week   Attends Religious Services: More than 4 times per year   Active Member of Clubs or Organizations: Yes   Attends Music therapist: More than 4 times  per year   Marital Status: Married  Human resources officer Violence: Not At Risk   Fear of Current or Ex-Partner: No   Emotionally Abused: No   Physically Abused: No   Sexually Abused: No    Outpatient Medications Prior to Visit  Medication Sig Dispense Refill   Cholecalciferol (VITAMIN D3) 2000 UNITS TABS Take by mouth daily.     glucose blood (FREESTYLE LITE) test strip CHECK BLOOD SUGAR ONCE DAILY.  Dx:E11.9 100 each 3   Lancets (FREESTYLE) lancets Check blood sugar once daily 100 each 3   metFORMIN (GLUCOPHAGE-XR) 750 MG 24 hr tablet TAKE 1 TABLET(750 MG) BY MOUTH DAILY WITH BREAKFAST 90 tablet 0   pravastatin (PRAVACHOL) 20 MG tablet TAKE 1 TABLET(20 MG) BY MOUTH DAILY 90 tablet 0   Semaglutide (RYBELSUS) 7 MG TABS Take 7 mg by mouth daily. 90 tablet 3   silodosin (RAPAFLO) 8 MG CAPS capsule Take 8 mg by mouth at bedtime.     TRAVATAN Z 0.004 % SOLN ophthalmic solution Place 1 drop into both eyes.     vitamin B-12 (CYANOCOBALAMIN) 1000 MCG tablet Take 1,000 mcg by mouth daily. (Patient not taking: Reported on 08/29/2021)     No facility-administered medications prior to visit.    No Known Allergies  ROS Review of Systems  Constitutional:  Negative for chills and fever.  Respiratory:  Negative for shortness of breath.   Cardiovascular:  Negative for chest pain.  Gastrointestinal:  Positive for nausea. Negative for abdominal pain, blood in stool, constipation, diarrhea and vomiting.  Neurological:  Negative for weakness.     Objective:    Physical Exam Vitals reviewed.  Constitutional:      Appearance: He is well-developed.  Cardiovascular:     Rate and Rhythm: Normal rate and regular rhythm.  Abdominal:     General: Bowel sounds are normal.     Palpations: Abdomen is soft. There is no splenomegaly or mass.     Tenderness: There is no abdominal tenderness.  Neurological:     Mental Status: He is alert.    BP 128/70 (BP Location: Left Arm, Patient Position: Sitting,  Cuff Size: Normal)   Pulse 83   Temp 97.9 F (36.6 C) (Oral)   Ht 5\' 10"  (1.778 m)   Wt 200 lb 14.4 oz (91.1 kg)   SpO2 98%   BMI 28.83 kg/m  Wt Readings from Last 3 Encounters:  08/29/21 200 lb 14.4 oz (91.1 kg)  08/10/21 204 lb (92.5 kg)  07/11/21 206 lb 8 oz (93.7 kg)     Health Maintenance Due  Topic Date Due   Zoster Vaccines- Shingrix (1 of 2) Never done   TETANUS/TDAP  06/15/2018   FOOT EXAM  07/06/2020   INFLUENZA VACCINE  06/13/2021    There are no preventive care reminders to display for this patient.  Lab Results  Component Value Date   TSH 1.25 10/15/2013   Lab Results  Component Value Date   WBC 4.6 08/22/2010   HGB 14.8 08/22/2010   HCT 44.7 08/22/2010   MCV 87.4 08/22/2010   PLT 144.0 (L) 08/22/2010   Lab Results  Component Value Date   NA 138 01/31/2021   K 4.5 01/31/2021   CO2 29 01/31/2021   GLUCOSE 126 (H) 01/31/2021   BUN 17 01/31/2021   CREATININE 1.18 01/31/2021   BILITOT 0.7 01/31/2021   ALKPHOS 63 01/31/2021   AST 23 01/31/2021   ALT 16 01/31/2021   PROT 6.9 01/31/2021   ALBUMIN 4.1 01/31/2021   CALCIUM 9.4 01/31/2021   GFR 57.78 (L) 01/31/2021   Lab Results  Component Value Date   CHOL 148 01/31/2021   Lab Results  Component Value Date   HDL 50.10 01/31/2021   Lab Results  Component Value Date   LDLCALC 78 01/31/2021   Lab Results  Component Value Date   TRIG 102.0 01/31/2021   Lab Results  Component Value Date   CHOLHDL 3 01/31/2021   Lab Results  Component Value Date   HGBA1C 6.9 (A) 08/29/2021      Assessment & Plan:   #1 ongoing nausea and dyspepsia.  Question related to Rybelsus.  He did have some gallbladder sludge and stones but no evidence for acute gallbladder disease on x-ray and doubt this is gallbladder related.  He denies any right upper quadrant pain. -Recommend holding Rybelsus for the next week to see if symptoms resolve. -If symptoms resolving off Rybelsus consider other options such as  Jardiance -If not improving off Rybelsus consider GI referral.  May need further evaluation with EGD and/or HIDA  #2 indeterminate right lobe liver mass with pending MRI this Saturday  #3 type 2 diabetes stable with A1c today 6.9%   No orders of the defined types were placed in this encounter.   Follow-up: No follow-ups on file.    [name], MD

## 2021-09-01 ENCOUNTER — Encounter: Payer: Self-pay | Admitting: Family Medicine

## 2021-09-03 ENCOUNTER — Other Ambulatory Visit: Payer: Self-pay

## 2021-09-03 ENCOUNTER — Ambulatory Visit
Admission: RE | Admit: 2021-09-03 | Discharge: 2021-09-03 | Disposition: A | Discharge status: Home / Self Care | Payer: Medicare HMO | Source: Ambulatory Visit | Attending: Family Medicine | Admitting: Family Medicine

## 2021-09-03 DIAGNOSIS — N281 Cyst of kidney, acquired: Secondary | ICD-10-CM | POA: Diagnosis not present

## 2021-09-03 DIAGNOSIS — R160 Hepatomegaly, not elsewhere classified: Secondary | ICD-10-CM

## 2021-09-03 DIAGNOSIS — K7689 Other specified diseases of liver: Secondary | ICD-10-CM | POA: Diagnosis not present

## 2021-09-03 IMAGING — MR MR ABDOMEN WO/W CM
12 of 17 series · 27 of 48 positions shown · IV contrast (19 ML MULTIHANCE)
Comparison: [DATE]

CLINICAL DATA: Evaluate liver mass seen on recent abdominal
sonogram.

EXAM:
MRI ABDOMEN WITHOUT AND WITH CONTRAST
TECHNIQUE: Multiplanar multisequence MR imaging of the abdomen was performed
both before and after the administration of intravenous contrast.
CONTRAST:  19mL MULTIHANCE GADOBENATE DIMEGLUMINE 529 MG/ML IV SOLN

[Series 5: axial tru fisp · axial · 5.0mm · 1.41mm/px · 1 of 37 slices shown]
[im 1/37]
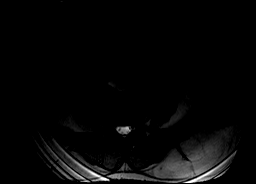

[Series 6: T2 · coronal · 5.0mm · 1.56mm/px · 1 of 33 slices shown (1 of 3)]
[im 1/33]
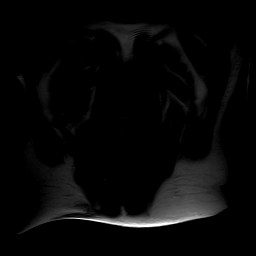

[Series 7: T2 · axial · 6.5mm · 0.74mm/px · 1 of 30 slices shown (2 of 3)]
[im 1/30]
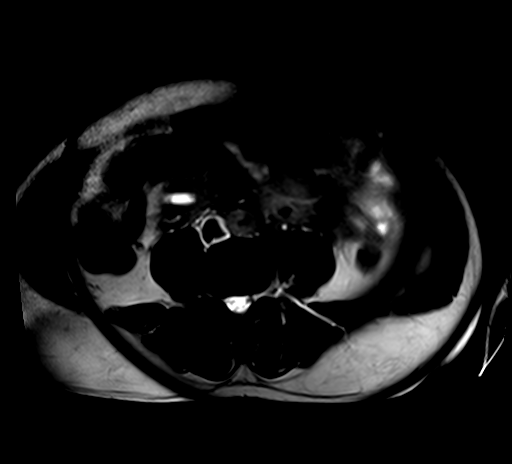

[Series 8: T2 · axial · 5.0mm · 1.37mm/px · 1 of 35 slices shown (3 of 3)]
[im 1/35]
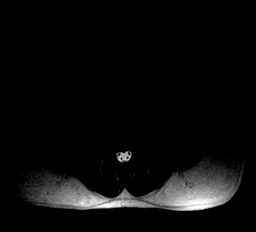

[Series 9: ep2d_diff_b50_500_800_p2 · axial · 6.0mm · 1.98mm/px · z∈[-160,+66]mm · 2 of 90 slices shown]
[im 1/90]
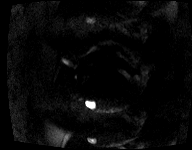
[im 90/90]
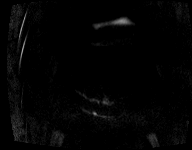

[Series 10: ep2d_[id number]_500_800_p2_adc · axial · 6.0mm · 1.98mm/px · 1 of 30 slices shown]
[im 1/30]
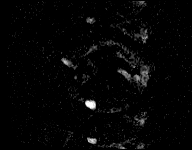

[Series 11: axial in out · axial · 6.0mm · 0.74mm/px · z∈[-173,+27]mm · 2 of 60 slices shown]
[im 1/60]
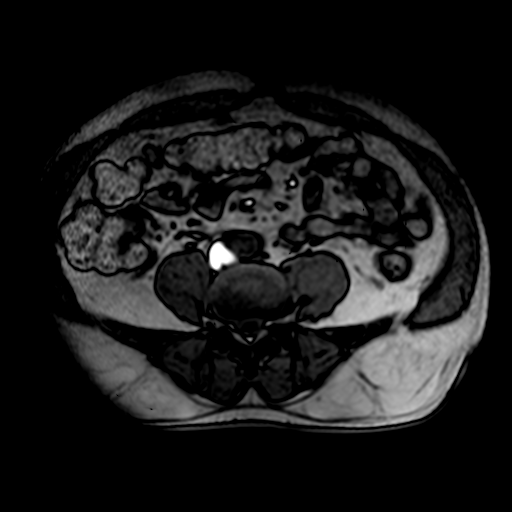
[im 60/60]
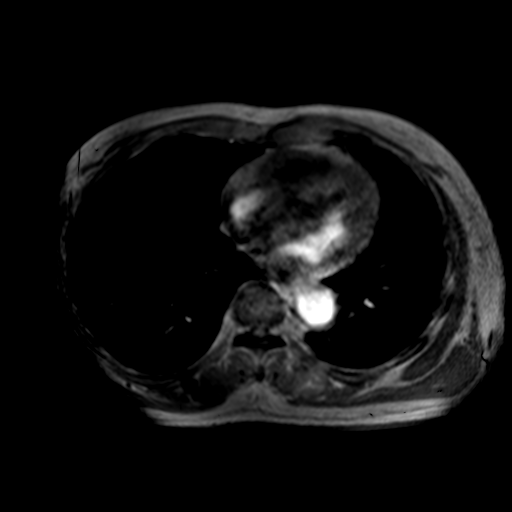

[Series 13: T1 dynamic · axial · non-contrast · 2.2mm · 0.78mm/px · z∈[-164,+45]mm · 4 of 96 slices shown]
[im 1/96]
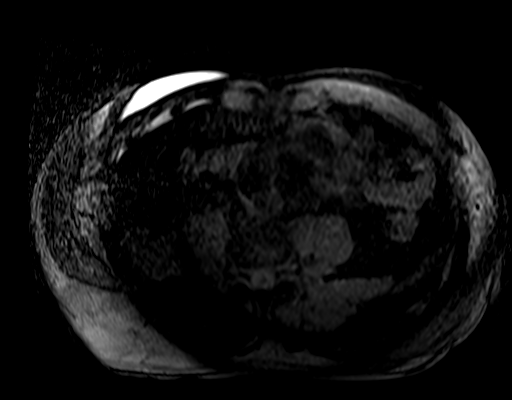
[im 32/96]
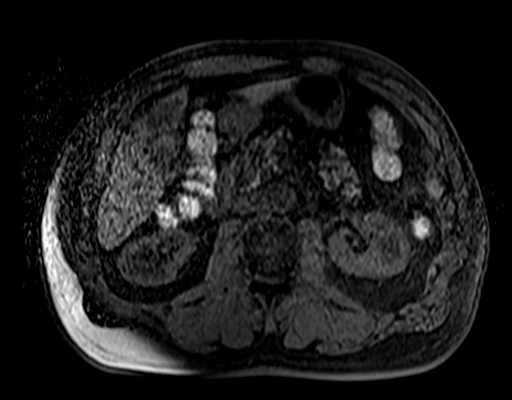
[im 64/96]
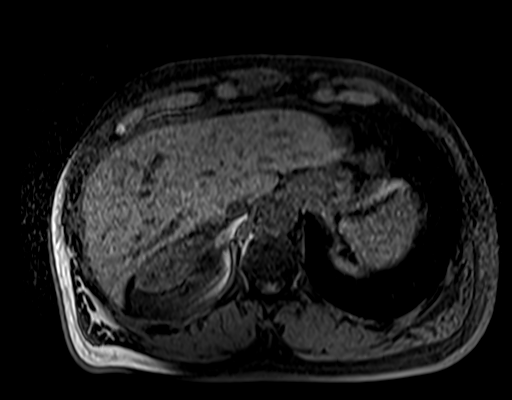
[im 96/96]
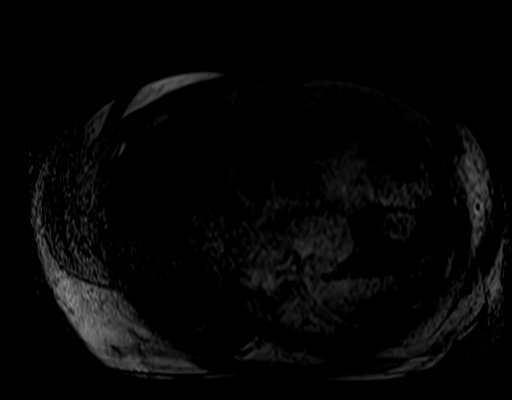

[Series 14: post 25 sec · axial · 2.2mm · 0.78mm/px · z∈[-164,+45]mm · 4 of 96 slices shown]
[im 1/96]
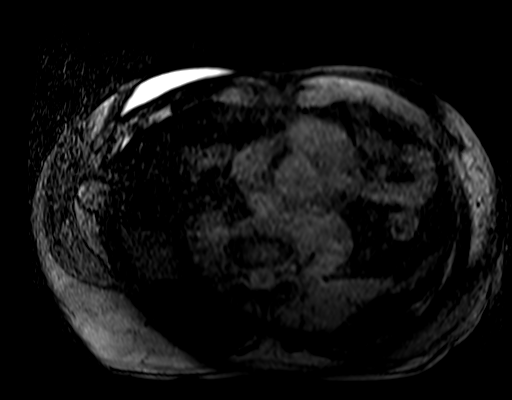
[im 32/96]
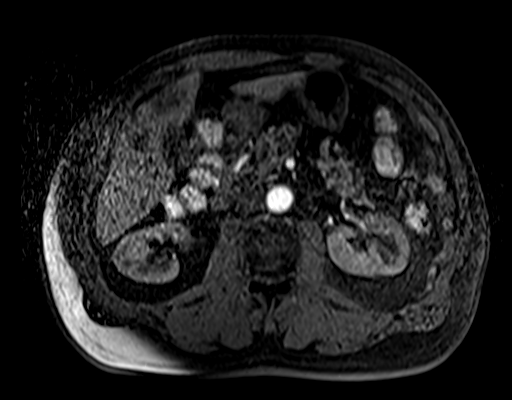
[im 64/96]
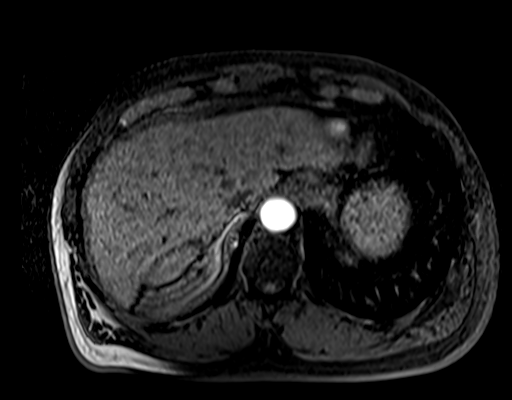
[im 96/96]
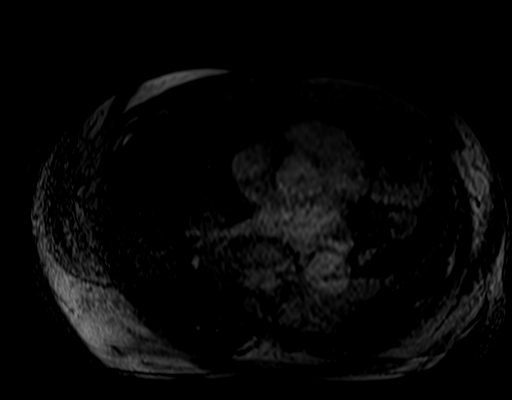

[Series 15: post 25 sec_sub · axial · 2.2mm · 0.78mm/px · z∈[-164,+45]mm · 4 of 96 slices shown]
[im 1/96]
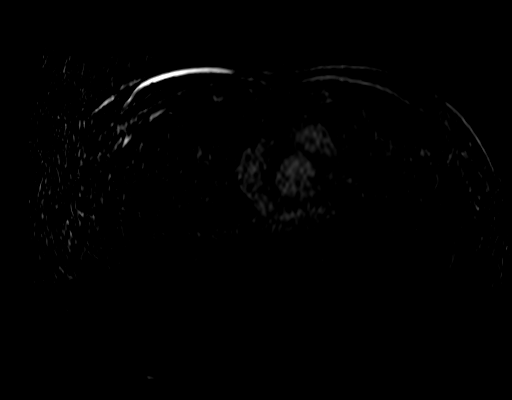
[im 32/96]
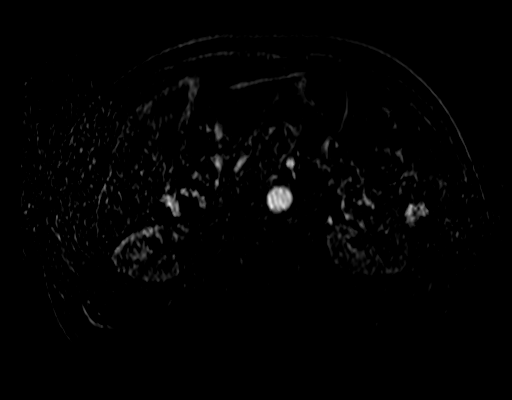
[im 64/96]
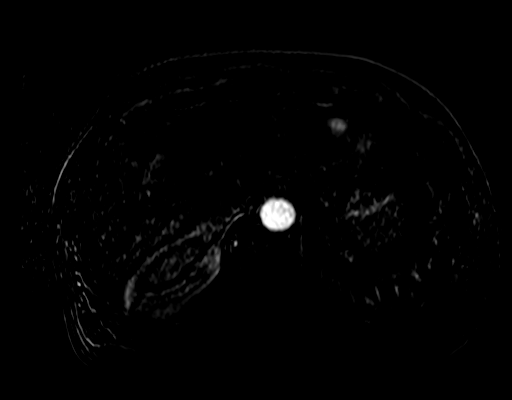
[im 96/96]
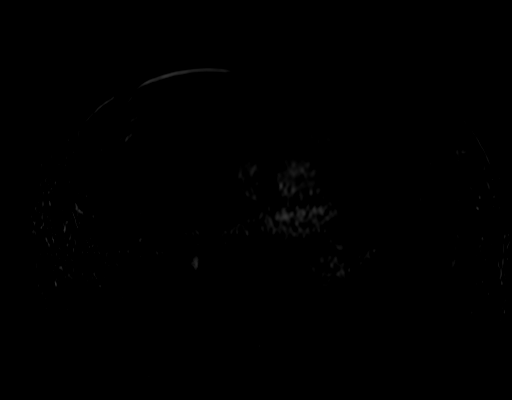

[Series 16: post 45 sec · axial · 2.2mm · 0.78mm/px · z∈[-164,+45]mm · 4 of 96 slices shown]
[im 1/96]
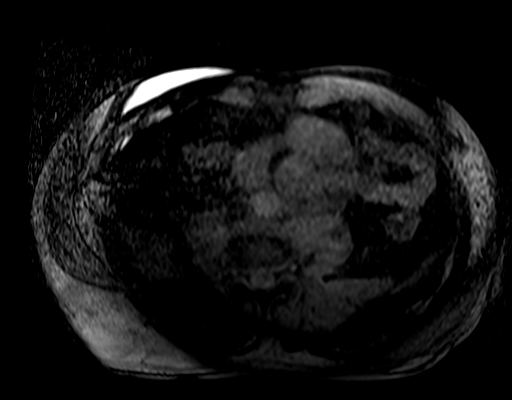
[im 32/96]
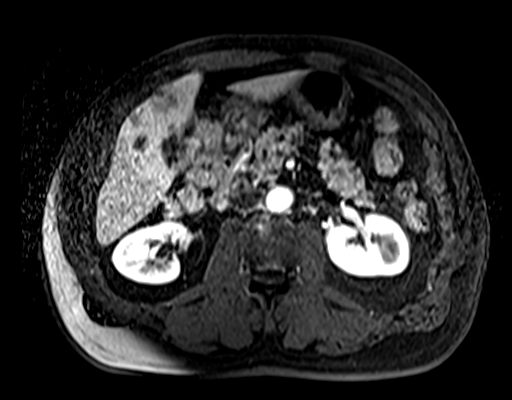
[im 64/96]
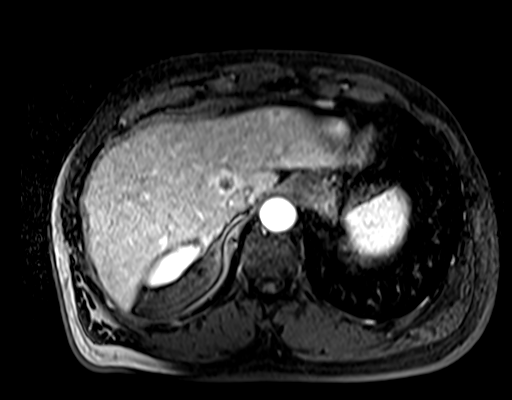
[im 96/96]
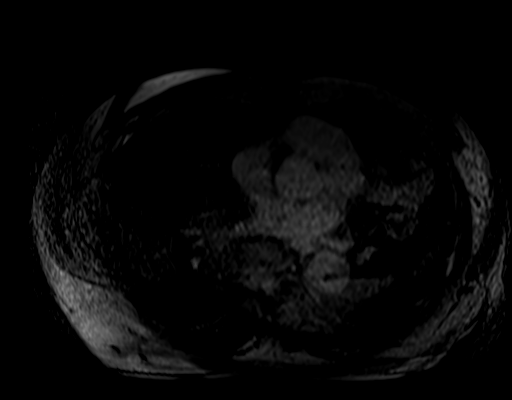

[Series 17: post 45 sec_sub · axial · 2.2mm · 0.78mm/px · z∈[-164,-96]mm · 2 of 96 slices shown]
[im 1/96]
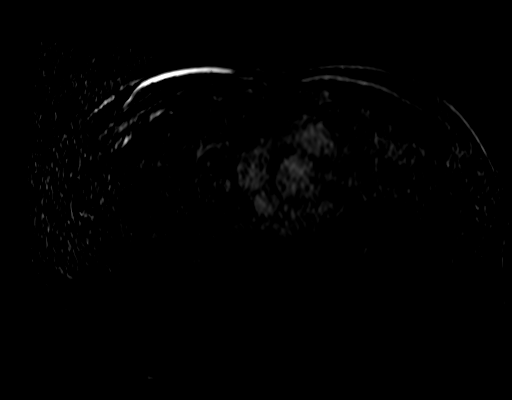
[im 32/96]
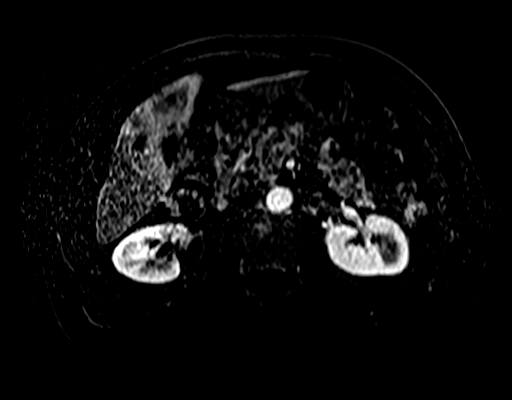

[27 of 48 positions shown; findings below may reference images not displayed]

FINDINGS: Lower chest: No acute findings.

Hepatobiliary: There are multifocal rim enhancing lesions identified
throughout both lobes of liver which are worrisome for metastatic
disease. Index lesion within lateral dome of right hepatic lobe
measures 1.1 cm, image 26/16. Segment 4a lesion measures 1.6 by
cm, image 34/16. Segment 2 lesion measures 0.8 x 0.7 cm, image
34/16. Within the posteromedial margin of right hepatic lobe
subcapsular lesion measures 2.5 x 2.0 cm, image 67/16. The
gallbladder is difficult to visualize being filled with stones and
partially contracted. No intrahepatic bile duct dilatation. The
common bile duct is upper limits of normal measuring 6 mm.

Pancreas: No signs of pancreatic inflammation. The main pancreatic
duct appears ectatic with scattered areas of side branch duct
ectasia.

Spleen:  Within normal limits in size and appearance.

Adrenals/Urinary Tract: Normal adrenal glands. Right kidney cyst
measures 7.0 x 4.2 cm, image [DATE]. No internal septation or mural
nodule. No suspicious kidney mass or hydronephrosis identified
bilaterally

Stomach/Bowel: Visualized portions within the abdomen are
unremarkable.

Vascular/Lymphatic: Normal appearance of the abdominal aorta. No
adenopathy identified.

Other:  None.

Musculoskeletal: No suspicious bone lesions identified.
IMPRESSION: 1. There are multifocal rim enhancing lesions identified throughout
both lobes of liver which are worrisome for metastatic disease.
Recommend correlation with tissue sampling.
2. Right kidney cyst.
3. The gallbladder is difficult to visualize being filled with
stones and partially contracted. No intrahepatic bile duct
dilatation.
4. The main pancreatic duct appears ectatic with scattered areas of
side branch duct ectasia.

## 2021-09-03 MED ORDER — GADOBENATE DIMEGLUMINE 529 MG/ML IV SOLN
19.0000 mL | Freq: Once | INTRAVENOUS | Status: AC | PRN
  Administered 2021-09-03: 19 mL via INTRAVENOUS

## 2021-09-06 ENCOUNTER — Telehealth: Payer: Self-pay

## 2021-09-06 NOTE — Telephone Encounter (Signed)
Patient called and asked PCP to precede with the order for a biopsy.

## 2021-09-06 NOTE — Telephone Encounter (Signed)
Please advise 

## 2021-09-07 ENCOUNTER — Other Ambulatory Visit: Payer: Self-pay | Admitting: Family Medicine

## 2021-09-07 DIAGNOSIS — R160 Hepatomegaly, not elsewhere classified: Secondary | ICD-10-CM

## 2021-09-07 NOTE — Telephone Encounter (Signed)
Patient called back and was informed of message below.

## 2021-09-07 NOTE — Telephone Encounter (Signed)
Noted. Will send back to Dr. Colletta [place] as a reminder.

## 2021-09-07 NOTE — Progress Notes (Signed)
Referral placed for interventional radiology evaluation for liver mass biopsy

## 2021-09-07 NOTE — Telephone Encounter (Signed)
ATC, unable to leave a message. Will try back.

## 2021-09-08 ENCOUNTER — Encounter: Payer: Self-pay | Admitting: Family Medicine

## 2021-09-09 NOTE — Telephone Encounter (Signed)
Noted. This referral is being processed. Patient has been made aware that the referral has been placed.

## 2021-09-12 ENCOUNTER — Other Ambulatory Visit: Payer: Self-pay | Admitting: Family Medicine

## 2021-09-12 DIAGNOSIS — R160 Hepatomegaly, not elsewhere classified: Secondary | ICD-10-CM

## 2021-09-12 NOTE — Progress Notes (Signed)
Patient had recent MRI abdomen which showed multiple liver masses.  We are trying to get him into interventional radiology for biopsies.  We discussed going ahead and getting CBC and comprehensive metabolic panel and these were ordered.  He will call to schedule these.

## 2021-09-12 NOTE — Progress Notes (Signed)
Re-ordered liver mass bx.

## 2021-09-13 ENCOUNTER — Encounter: Payer: Self-pay | Admitting: Family Medicine

## 2021-09-13 ENCOUNTER — Other Ambulatory Visit: Payer: Self-pay

## 2021-09-13 ENCOUNTER — Encounter (HOSPITAL_COMMUNITY): Payer: Self-pay | Admitting: Radiology

## 2021-09-13 ENCOUNTER — Other Ambulatory Visit (INDEPENDENT_AMBULATORY_CARE_PROVIDER_SITE_OTHER): Payer: Medicare HMO

## 2021-09-13 DIAGNOSIS — R160 Hepatomegaly, not elsewhere classified: Secondary | ICD-10-CM

## 2021-09-13 LAB — CBC WITH DIFFERENTIAL/PLATELET
Basophils Absolute: 0 10*3/uL (ref 0.0–0.1)
Basophils Relative: 0.8 % (ref 0.0–3.0)
Eosinophils Absolute: 0.2 10*3/uL (ref 0.0–0.7)
Eosinophils Relative: 3.1 % (ref 0.0–5.0)
HCT: 42.9 % (ref 39.0–52.0)
Hemoglobin: 14 g/dL (ref 13.0–17.0)
Lymphocytes Relative: 34.7 % (ref 12.0–46.0)
Lymphs Abs: 1.9 10*3/uL (ref 0.7–4.0)
MCHC: 32.7 g/dL (ref 30.0–36.0)
MCV: 85.7 fl (ref 78.0–100.0)
Monocytes Absolute: 0.5 10*3/uL (ref 0.1–1.0)
Monocytes Relative: 9.3 % (ref 3.0–12.0)
Neutro Abs: 2.9 10*3/uL (ref 1.4–7.7)
Neutrophils Relative %: 52.1 % (ref 43.0–77.0)
Platelets: 162 10*3/uL (ref 150.0–400.0)
RBC: 5 Mil/uL (ref 4.22–5.81)
RDW: 13.4 % (ref 11.5–15.5)
WBC: 5.6 10*3/uL (ref 4.0–10.5)

## 2021-09-13 LAB — COMPREHENSIVE METABOLIC PANEL
ALT: 15 U/L (ref 0–53)
AST: 25 U/L (ref 0–37)
Albumin: 4.1 g/dL (ref 3.5–5.2)
Alkaline Phosphatase: 80 U/L (ref 39–117)
BUN: 16 mg/dL (ref 6–23)
CO2: 29 mEq/L (ref 19–32)
Calcium: 9.6 mg/dL (ref 8.4–10.5)
Chloride: 100 mEq/L (ref 96–112)
Creatinine, Ser: 1.23 mg/dL (ref 0.40–1.50)
GFR: 54.73 mL/min — ABNORMAL LOW (ref >60.00)
Glucose, Bld: 186 mg/dL — ABNORMAL HIGH (ref 70–99)
Potassium: 4.7 mEq/L (ref 3.5–5.1)
Sodium: 138 mEq/L (ref 135–145)
Total Bilirubin: 1.2 mg/dL (ref 0.2–1.2)
Total Protein: 7.1 g/dL (ref 6.0–8.3)

## 2021-09-13 NOTE — Progress Notes (Signed)
Patient Name  Donavon, Kimrey Legal Sex  Male DOB  January 12, 1941 SSN  [id number] Address  [place] [address] Phone  [phone] [facility])  [phone] (Mobile) *Preferred*    RE: US BIOPSY (LIVER) Received: Today Ilean China D      Previous Messages   ----- Message -----  From: Arne Cleveland, MD  Sent: 09/13/2021   9:09 AM EDT  To: Valli Glance  Subject: RE: US BIOPSY (LIVER)                           Ok   [place] core bx liver lesion  R/o met   DDH     ----- Message -----  From: Valli Glance  Sent: 09/12/2021   5:57 PM EDT  To: Ir Procedure Requests  Subject: US BIOPSY (LIVER)                               US BIOPSY (LIVER)     Reason: Liver masses     History: MRI Abdomen and US Abdomen both in computer.     Provider: Laurey Morale     Contact: [phone]

## 2021-09-16 ENCOUNTER — Emergency Department (HOSPITAL_COMMUNITY): Payer: Medicare HMO

## 2021-09-16 ENCOUNTER — Inpatient Hospital Stay (HOSPITAL_COMMUNITY): Payer: Medicare HMO

## 2021-09-16 ENCOUNTER — Other Ambulatory Visit: Payer: Self-pay

## 2021-09-16 ENCOUNTER — Telehealth: Payer: Self-pay | Admitting: Family Medicine

## 2021-09-16 ENCOUNTER — Inpatient Hospital Stay (HOSPITAL_COMMUNITY)
Admission: EM | Admit: 2021-09-16 | Discharge: 2021-09-19 | DRG: 065 | Disposition: A | Discharge status: Home / Self Care | Payer: Medicare HMO | Attending: Internal Medicine | Admitting: Internal Medicine

## 2021-09-16 ENCOUNTER — Encounter (HOSPITAL_COMMUNITY): Payer: Self-pay | Admitting: *Deleted

## 2021-09-16 DIAGNOSIS — E1122 Type 2 diabetes mellitus with diabetic chronic kidney disease: Secondary | ICD-10-CM | POA: Diagnosis not present

## 2021-09-16 DIAGNOSIS — E782 Mixed hyperlipidemia: Secondary | ICD-10-CM | POA: Diagnosis not present

## 2021-09-16 DIAGNOSIS — E785 Hyperlipidemia, unspecified: Secondary | ICD-10-CM | POA: Diagnosis not present

## 2021-09-16 DIAGNOSIS — R160 Hepatomegaly, not elsewhere classified: Secondary | ICD-10-CM | POA: Diagnosis not present

## 2021-09-16 DIAGNOSIS — R2689 Other abnormalities of gait and mobility: Secondary | ICD-10-CM | POA: Diagnosis present

## 2021-09-16 DIAGNOSIS — I6310 Cerebral infarction due to embolism of unspecified precerebral artery: Secondary | ICD-10-CM | POA: Diagnosis not present

## 2021-09-16 DIAGNOSIS — R4182 Altered mental status, unspecified: Secondary | ICD-10-CM | POA: Diagnosis not present

## 2021-09-16 DIAGNOSIS — N400 Enlarged prostate without lower urinary tract symptoms: Secondary | ICD-10-CM | POA: Diagnosis present

## 2021-09-16 DIAGNOSIS — Z823 Family history of stroke: Secondary | ICD-10-CM | POA: Diagnosis not present

## 2021-09-16 DIAGNOSIS — E1165 Type 2 diabetes mellitus with hyperglycemia: Secondary | ICD-10-CM

## 2021-09-16 DIAGNOSIS — I129 Hypertensive chronic kidney disease with stage 1 through stage 4 chronic kidney disease, or unspecified chronic kidney disease: Secondary | ICD-10-CM | POA: Diagnosis not present

## 2021-09-16 DIAGNOSIS — I639 Cerebral infarction, unspecified: Secondary | ICD-10-CM

## 2021-09-16 DIAGNOSIS — Z7984 Long term (current) use of oral hypoglycemic drugs: Secondary | ICD-10-CM | POA: Diagnosis not present

## 2021-09-16 DIAGNOSIS — Z833 Family history of diabetes mellitus: Secondary | ICD-10-CM | POA: Diagnosis not present

## 2021-09-16 DIAGNOSIS — R911 Solitary pulmonary nodule: Secondary | ICD-10-CM | POA: Diagnosis present

## 2021-09-16 DIAGNOSIS — E1169 Type 2 diabetes mellitus with other specified complication: Secondary | ICD-10-CM | POA: Diagnosis present

## 2021-09-16 DIAGNOSIS — Q2112 Patent foramen ovale: Secondary | ICD-10-CM

## 2021-09-16 DIAGNOSIS — Z20822 Contact with and (suspected) exposure to covid-19: Secondary | ICD-10-CM | POA: Diagnosis not present

## 2021-09-16 DIAGNOSIS — R29700 NIHSS score 0: Secondary | ICD-10-CM | POA: Diagnosis present

## 2021-09-16 DIAGNOSIS — Z8249 Family history of ischemic heart disease and other diseases of the circulatory system: Secondary | ICD-10-CM

## 2021-09-16 DIAGNOSIS — R42 Dizziness and giddiness: Secondary | ICD-10-CM | POA: Diagnosis not present

## 2021-09-16 DIAGNOSIS — I6340 Cerebral infarction due to embolism of unspecified cerebral artery: Principal | ICD-10-CM | POA: Diagnosis present

## 2021-09-16 DIAGNOSIS — E119 Type 2 diabetes mellitus without complications: Secondary | ICD-10-CM

## 2021-09-16 DIAGNOSIS — Z79899 Other long term (current) drug therapy: Secondary | ICD-10-CM | POA: Diagnosis not present

## 2021-09-16 DIAGNOSIS — I6389 Other cerebral infarction: Secondary | ICD-10-CM | POA: Diagnosis not present

## 2021-09-16 DIAGNOSIS — I10 Essential (primary) hypertension: Secondary | ICD-10-CM | POA: Diagnosis not present

## 2021-09-16 DIAGNOSIS — Z87891 Personal history of nicotine dependence: Secondary | ICD-10-CM | POA: Diagnosis not present

## 2021-09-16 DIAGNOSIS — N1831 Chronic kidney disease, stage 3a: Secondary | ICD-10-CM | POA: Diagnosis not present

## 2021-09-16 LAB — I-STAT CHEM 8, ED
BUN: 20 mg/dL (ref 8–23)
Calcium, Ion: 1.24 mmol/L (ref 1.15–1.40)
Chloride: 103 mmol/L (ref 98–111)
Creatinine, Ser: 1.2 mg/dL (ref 0.61–1.24)
Glucose, Bld: 160 mg/dL — ABNORMAL HIGH (ref 70–99)
HCT: 42 % (ref 39.0–52.0)
Hemoglobin: 14.3 g/dL (ref 13.0–17.0)
Potassium: 4.9 mmol/L (ref 3.5–5.1)
Sodium: 140 mmol/L (ref 135–145)
TCO2: 30 mmol/L (ref 22–32)

## 2021-09-16 LAB — CBC
HCT: 41.4 % (ref 39.0–52.0)
Hemoglobin: 13.5 g/dL (ref 13.0–17.0)
MCH: 28.3 pg (ref 26.0–34.0)
MCHC: 32.6 g/dL (ref 30.0–36.0)
MCV: 86.8 fL (ref 80.0–100.0)
Platelets: 162 10*3/uL (ref 150–400)
RBC: 4.77 MIL/uL (ref 4.22–5.81)
RDW: 13.1 % (ref 11.5–15.5)
WBC: 4.2 10*3/uL (ref 4.0–10.5)
nRBC: 0 % (ref 0.0–0.2)

## 2021-09-16 LAB — DIFFERENTIAL
Abs Immature Granulocytes: 0.01 10*3/uL (ref 0.00–0.07)
Basophils Absolute: 0 10*3/uL (ref 0.0–0.1)
Basophils Relative: 1 %
Eosinophils Absolute: 0.2 10*3/uL (ref 0.0–0.5)
Eosinophils Relative: 4 %
Immature Granulocytes: 0 %
Lymphocytes Relative: 35 %
Lymphs Abs: 1.5 10*3/uL (ref 0.7–4.0)
Monocytes Absolute: 0.4 10*3/uL (ref 0.1–1.0)
Monocytes Relative: 10 %
Neutro Abs: 2.1 10*3/uL (ref 1.7–7.7)
Neutrophils Relative %: 50 %

## 2021-09-16 LAB — COMPREHENSIVE METABOLIC PANEL
ALT: 19 U/L (ref 0–44)
AST: 28 U/L (ref 15–41)
Albumin: 3.5 g/dL (ref 3.5–5.0)
Alkaline Phosphatase: 69 U/L (ref 38–126)
Anion gap: 6 (ref 5–15)
BUN: 19 mg/dL (ref 8–23)
CO2: 28 mmol/L (ref 22–32)
Calcium: 9.2 mg/dL (ref 8.9–10.3)
Chloride: 104 mmol/L (ref 98–111)
Creatinine, Ser: 1.05 mg/dL (ref 0.61–1.24)
GFR, Estimated: >60 mL/min (ref >60)
Glucose, Bld: 162 mg/dL — ABNORMAL HIGH (ref 70–99)
Potassium: 5 mmol/L (ref 3.5–5.1)
Sodium: 138 mmol/L (ref 135–145)
Total Bilirubin: 0.7 mg/dL (ref 0.3–1.2)
Total Protein: 7 g/dL (ref 6.5–8.1)

## 2021-09-16 LAB — APTT: aPTT: 25 seconds (ref 24–36)

## 2021-09-16 LAB — URINALYSIS, ROUTINE W REFLEX MICROSCOPIC
Bacteria, UA: NONE SEEN
Bilirubin Urine: NEGATIVE
Glucose, UA: NEGATIVE mg/dL
Ketones, ur: NEGATIVE mg/dL
Leukocytes,Ua: NEGATIVE
Nitrite: NEGATIVE
Protein, ur: NEGATIVE mg/dL
Specific Gravity, Urine: 1.009 (ref 1.005–1.030)
pH: 7 (ref 5.0–8.0)

## 2021-09-16 LAB — RESP PANEL BY RT-PCR (FLU A&B, COVID) ARPGX2
Influenza A by PCR: NEGATIVE
Influenza B by PCR: NEGATIVE
SARS Coronavirus 2 by RT PCR: NEGATIVE

## 2021-09-16 LAB — RAPID URINE DRUG SCREEN, HOSP PERFORMED
Amphetamines: NOT DETECTED
Barbiturates: NOT DETECTED
Benzodiazepines: NOT DETECTED
Cocaine: NOT DETECTED
Opiates: NOT DETECTED
Tetrahydrocannabinol: NOT DETECTED

## 2021-09-16 LAB — PROTIME-INR
INR: 1.1 (ref 0.8–1.2)
Prothrombin Time: 13.8 seconds (ref 11.4–15.2)

## 2021-09-16 LAB — ETHANOL: Alcohol, Ethyl (B): <10 mg/dL (ref <10)

## 2021-09-16 LAB — CBG MONITORING, ED: Glucose-Capillary: 97 mg/dL (ref 70–99)

## 2021-09-16 LAB — AMMONIA: Ammonia: 23 umol/L (ref 9–35)

## 2021-09-16 IMAGING — CT CT ANGIO HEAD
2 of 7 series · 8 of 33 positions shown · IV contrast (omnipaque)
Comparison: None.

CLINICAL DATA: Dizziness



[Series 505: cta head neck · axial · 0.63mm/px · z∈[-242,-130]mm · 2 of 170 slices shown]
[im 57/170  soft-tissue]
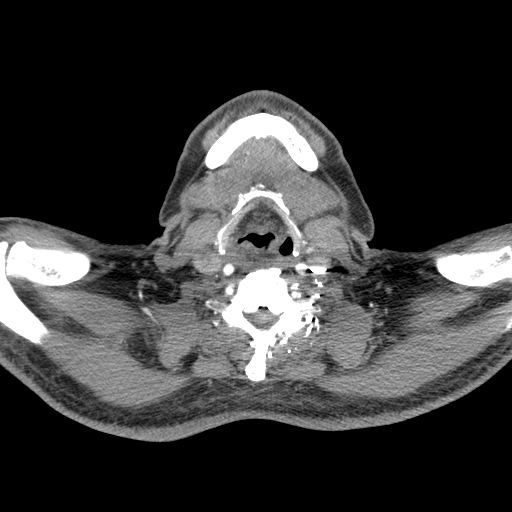
[im 113/170  soft-tissue]
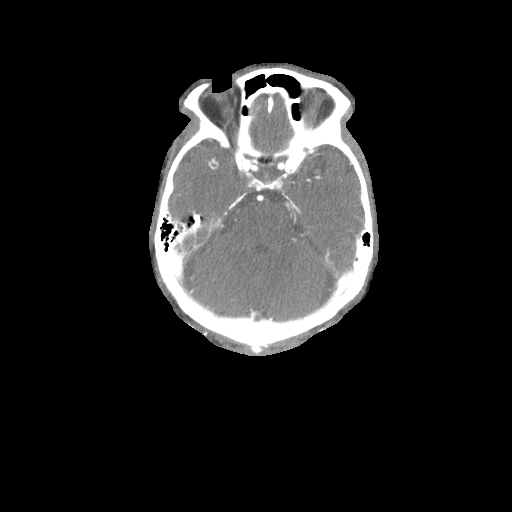

[Series 506: ax thin · axial · 0.63mm/px · z∈[-307,-72]mm · 6 of 330 slices shown]
[im 48/330  soft-tissue]
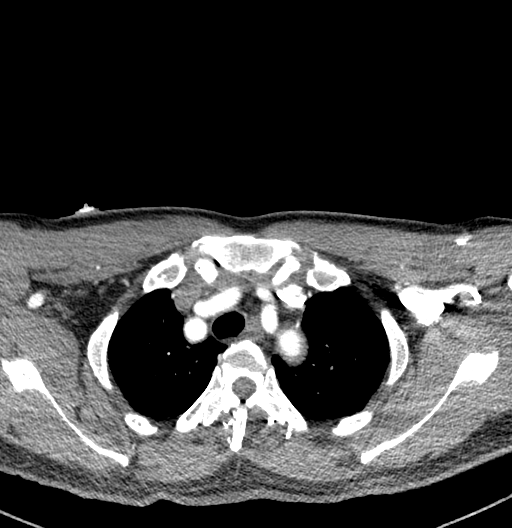
[im 95/330  bone]
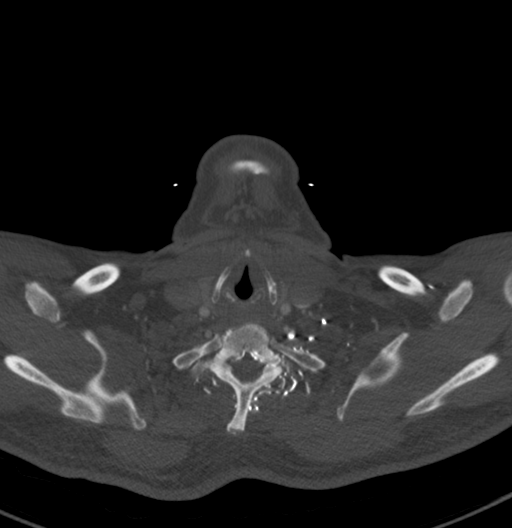
[im 142/330  soft-tissue]
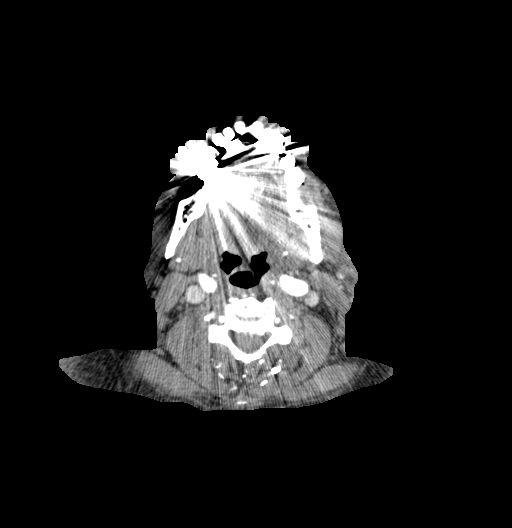
[im 189/330  bone]
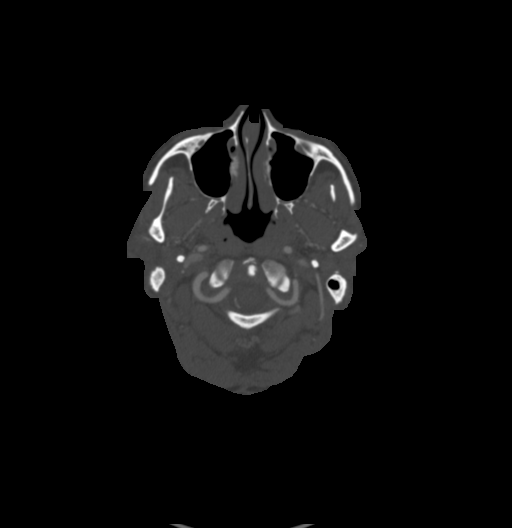
[im 236/330  soft-tissue]
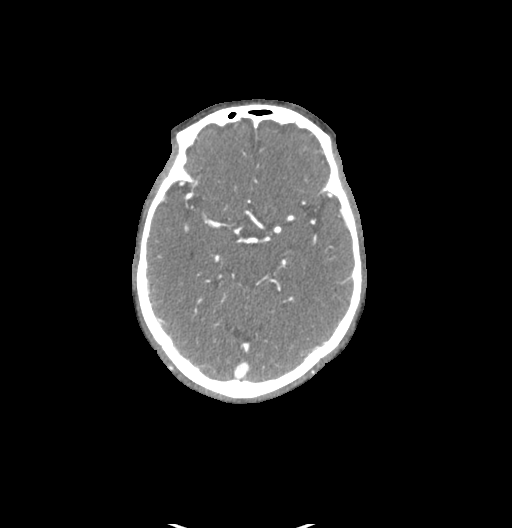
[im 283/330  bone]
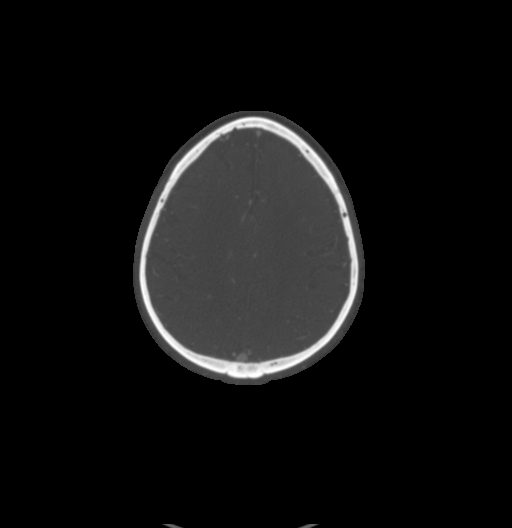

[8 of 33 positions shown; findings below may reference images not displayed]

FINDINGS: CTA NECK FINDINGS

SKELETON: There is no bony spinal canal stenosis. No lytic or
blastic lesion.

OTHER NECK: Normal pharynx, larynx and major salivary glands. No
cervical lymphadenopathy. Unremarkable thyroid gland.

UPPER CHEST: Right upper lobe nodule measures 9 mm (series 5, image
8).

AORTIC ARCH:

There is no calcific atherosclerosis of the aortic arch. There is no
aneurysm, dissection or hemodynamically significant stenosis of the
visualized portion of the aorta. Conventional 3 vessel aortic
branching pattern. The visualized proximal subclavian arteries are
widely patent.

RIGHT CAROTID SYSTEM: Normal without aneurysm, dissection or
stenosis.

LEFT CAROTID SYSTEM: Normal without aneurysm, dissection or
stenosis.

VERTEBRAL ARTERIES: Left dominant configuration. Both origins are
clearly patent. There is no dissection, occlusion or flow-limiting
stenosis to the skull base (V1-V3 segments).

CTA HEAD FINDINGS

POSTERIOR CIRCULATION:

--Vertebral arteries: Normal V4 segments.

--Inferior cerebellar arteries: Normal.

--Basilar artery: Normal.

--Superior cerebellar arteries: Normal.

--Posterior cerebral arteries (PCA): Normal.

ANTERIOR CIRCULATION:

--Intracranial internal carotid arteries: Normal.

--Anterior cerebral arteries (ACA): Normal. Both A1 segments are
present. Patent anterior communicating artery (a-comm).

--Middle cerebral arteries (MCA): Normal.

VENOUS SINUSES: As permitted by contrast timing, patent.

ANATOMIC VARIANTS: None

Review of the MIP images confirms the above findings.
IMPRESSION: 1. No emergent large vessel occlusion or high-grade stenosis of the
intracranial or cervical arteries.
2. 9 mm right upper lobe pulmonary nodule. Consider one of the
following in 3 months for both low-risk and high-risk individuals:
(a) repeat chest CT, (b) follow-up PET-CT, or (c) tissue sampling.
This recommendation follows the consensus statement: Guidelines for
Management of Incidental Pulmonary Nodules Detected on CT Images:

## 2021-09-16 IMAGING — CT CT HEAD W/O CM
3 series · 14 of 47 positions shown, 16 images · non-contrast
Comparison: None.

CLINICAL DATA: Altered mental status.

EXAM:
CT HEAD WITHOUT CONTRAST
TECHNIQUE: Contiguous axial images were obtained from the base of the skull
through the vertex without intravenous contrast.

[Series 2: head wo · axial · 0.47mm/px · z∈[-124,+1]mm · 8 of 30 slices shown, 10 images]
[im 3/30  brain]
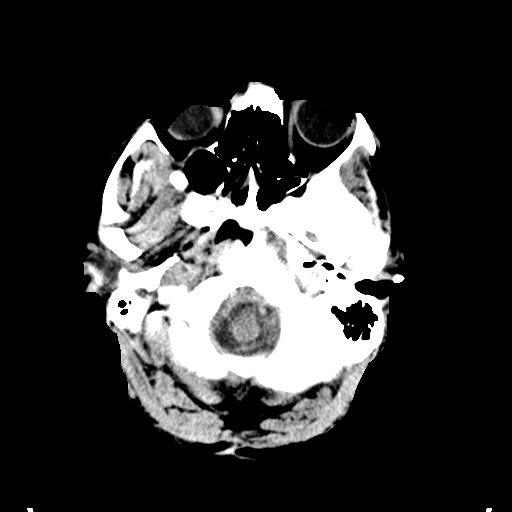
[im 3/30  bone]
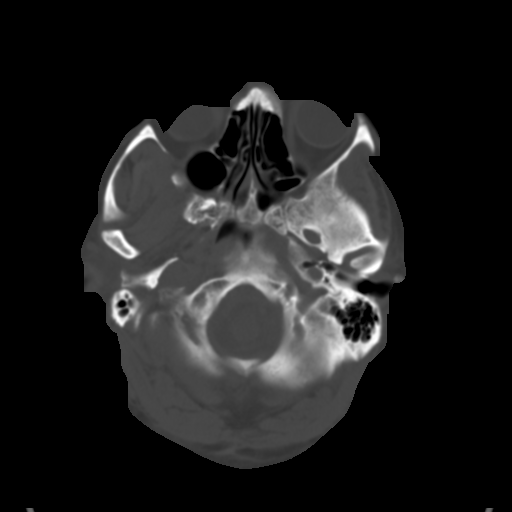
[im 7/30  brain]
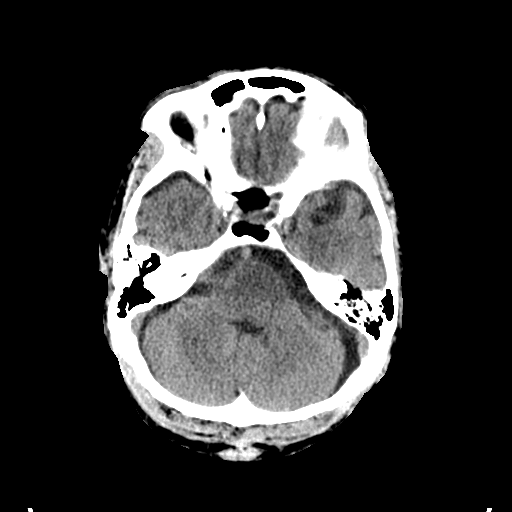
[im 10/30  brain]
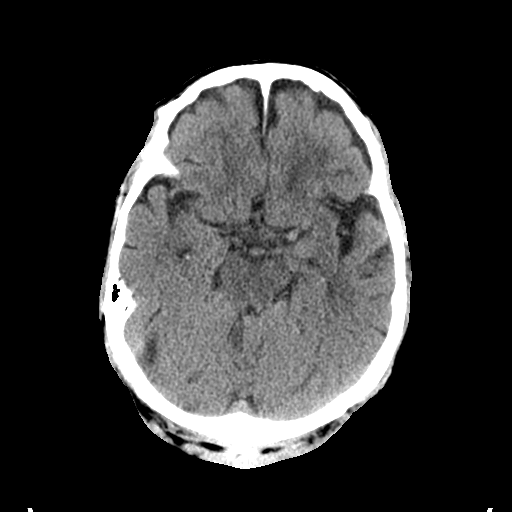
[im 14/30  brain]
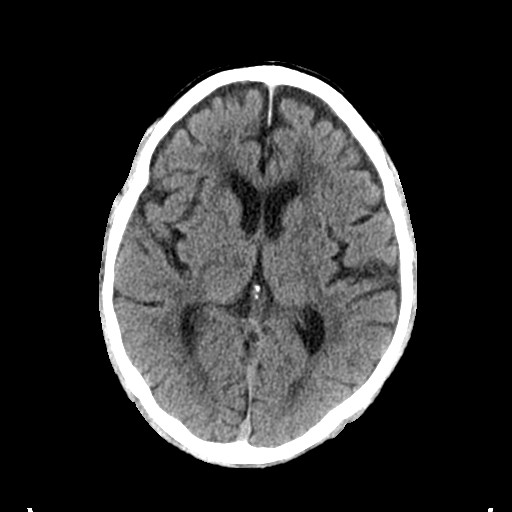
[im 17/30  brain]
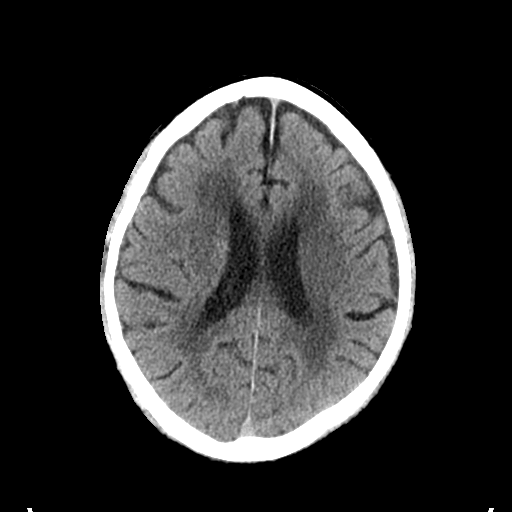
[im 17/30  bone]
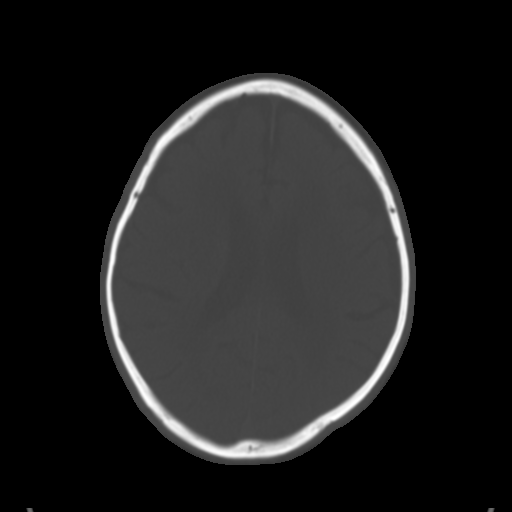
[im 21/30  brain]
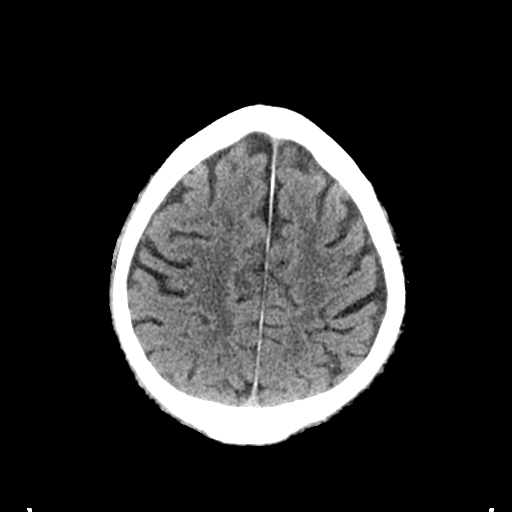
[im 24/30  brain]
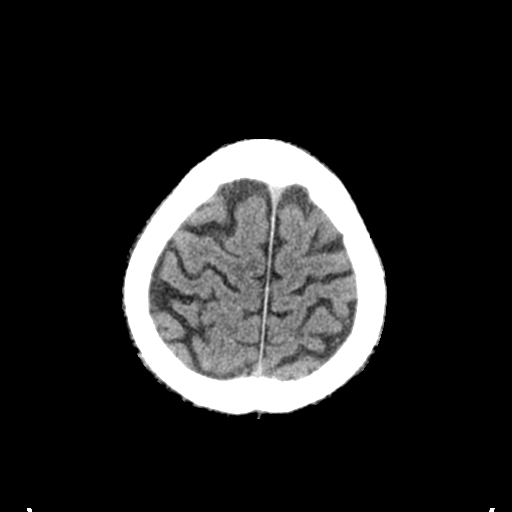
[im 28/30  brain]
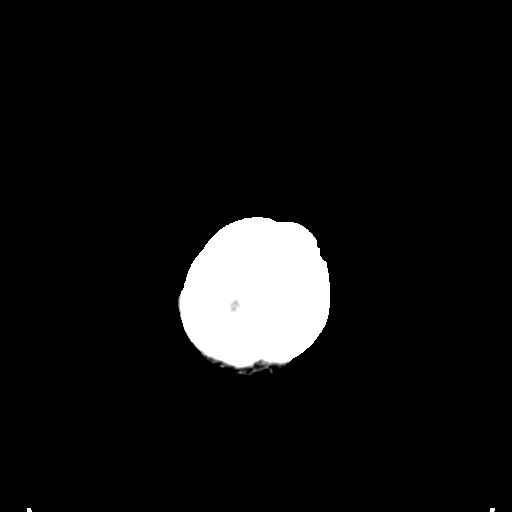

[Series 4: coronal soft tissue · coronal · 0.31mm/px · 3 of 68 slices shown]
[im 23/68  brain]
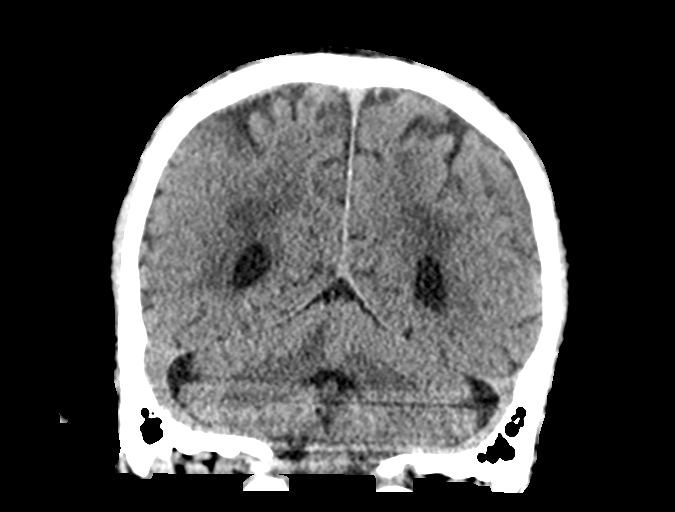
[im 30/68  brain]
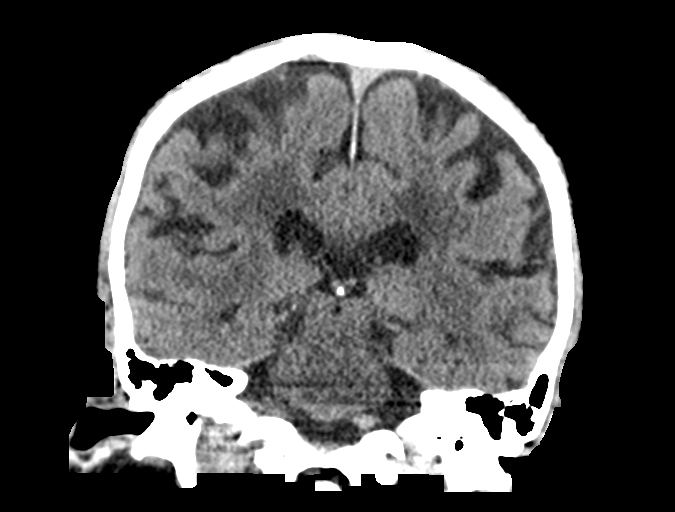
[im 38/68  brain]
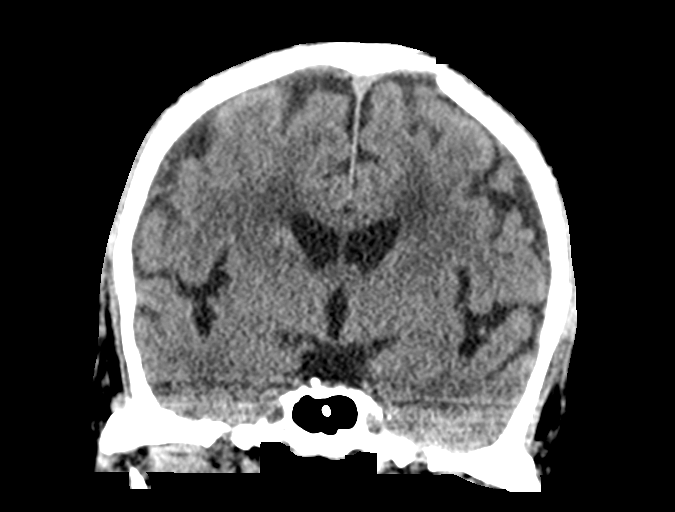

[Series 5: sagittal soft tissue · sagittal · 0.33mm/px · 3 of 57 slices shown]
[im 19/57  brain]
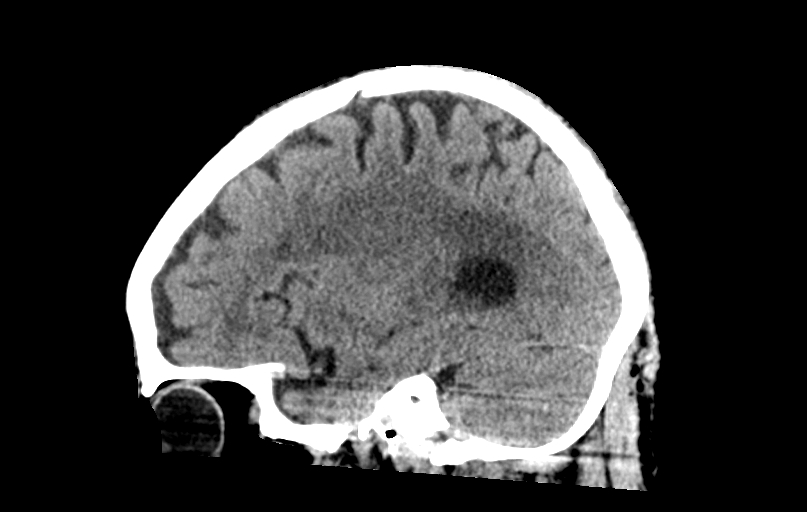
[im 29/57  brain]
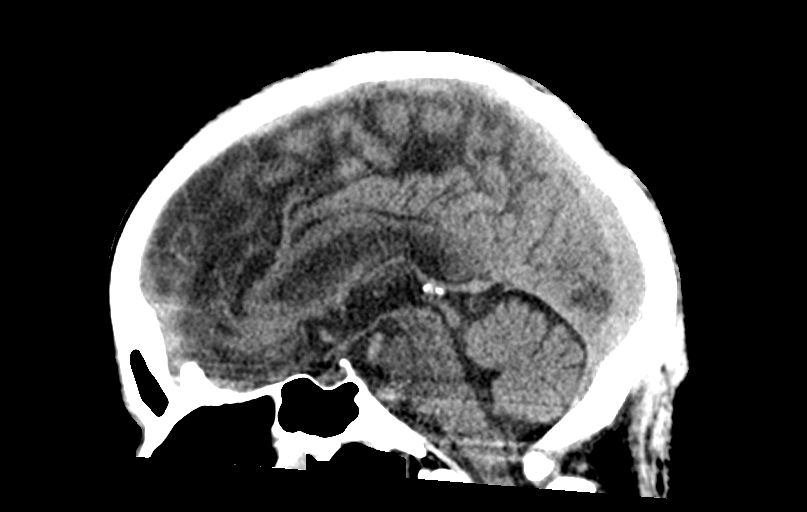
[im 38/57  brain]
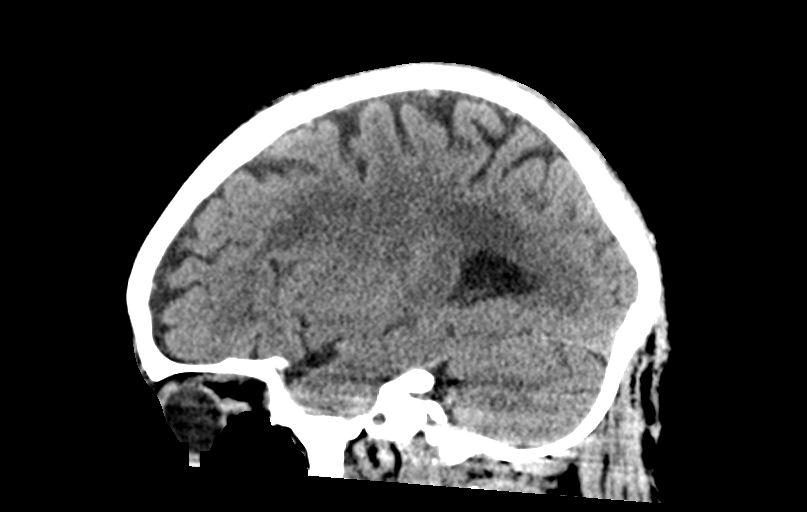

[14 of 47 positions shown; findings below may reference images not displayed]

FINDINGS: Brain: Mild chronic ischemic white matter disease is noted. No mass
effect or midline shift is noted. Ventricular size is within normal
limits. There is no evidence of mass lesion, hemorrhage or acute
infarction.

Vascular: No hyperdense vessel or unexpected calcification.

Skull: Normal. Negative for fracture or focal lesion.

Sinuses/Orbits: No acute finding.

Other: None.
IMPRESSION: No acute intracranial abnormality seen.

## 2021-09-16 IMAGING — MR MR HEAD W/O CM
10 series · 48 of 48 positions shown · non-contrast
Comparison: No prior MRI, correlation is made with [DATE] CT
head

CLINICAL DATA: Dizziness

EXAM:
MRI HEAD WITHOUT CONTRAST
TECHNIQUE: Multiplanar, multiecho pulse sequences of the brain and surrounding
structures were obtained without intravenous contrast.

[Series 5: DWI · axial · 3.0mm · 1.36mm/px · z∈[-60,+80]mm · 8 of 96 slices shown (1 of 2)]
[im 1/96]
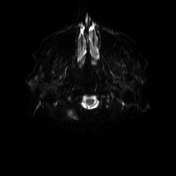
[im 14/96]
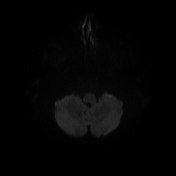
[im 28/96]
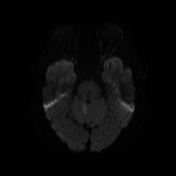
[im 41/96]
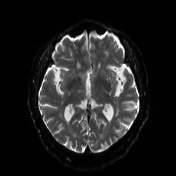
[im 55/96]
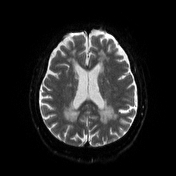
[im 68/96]
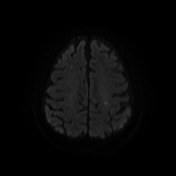
[im 82/96]
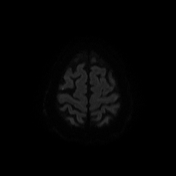
[im 96/96]
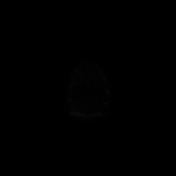

[Series 6: DWI · axial · 3.0mm · 1.36mm/px · z∈[-60,+80]mm · 4 of 48 slices shown (2 of 2)]
[im 1/48]
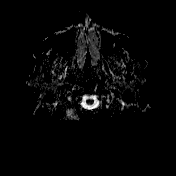
[im 16/48]
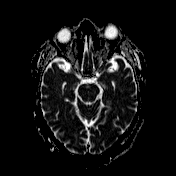
[im 32/48]
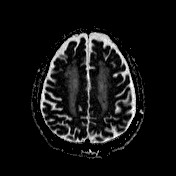
[im 48/48]
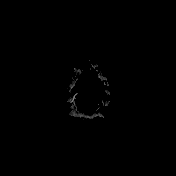

[Series 7: T1 · sagittal · 5.0mm · 0.75mm/px · 2 of 24 slices shown (1 of 2)]
[im 1/24]
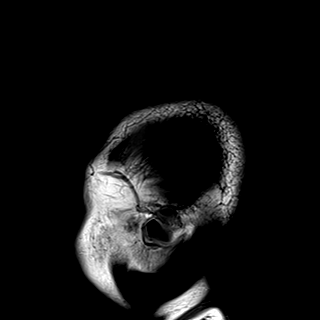
[im 24/24]
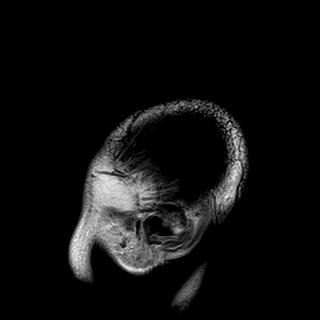

[Series 8: T2 · axial · 5.0mm · 0.62mm/px · z∈[-72,+90]mm · 2 of 26 slices shown (1 of 2)]
[im 1/26]
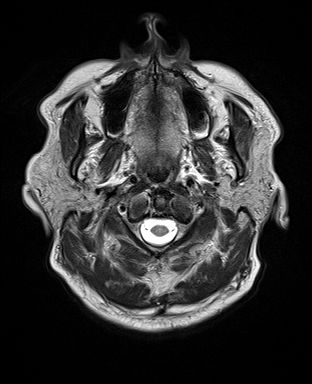
[im 26/26]
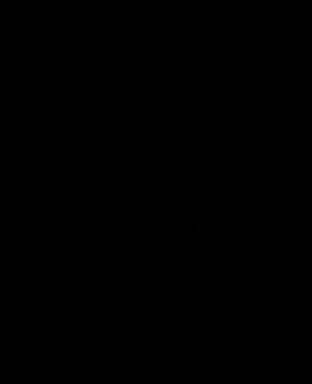

[Series 9: swi_images · axial · 3.0mm · 0.75mm/px · z∈[-79,+97]mm · 5 of 60 slices shown]
[im 1/60]
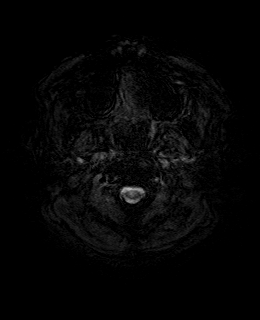
[im 15/60]
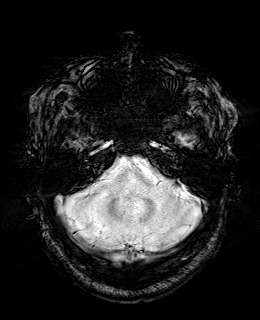
[im 30/60]
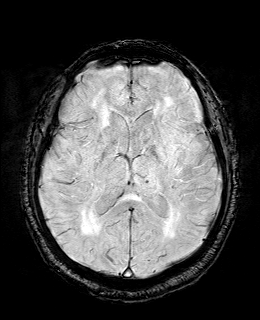
[im 45/60]
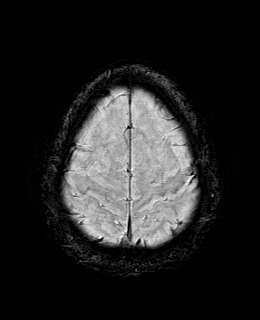
[im 60/60]
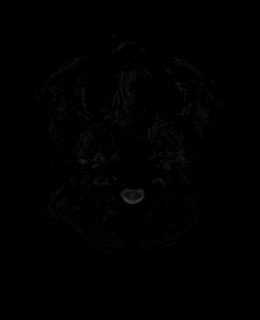

[Series 11: FLAIR · axial · 3.0mm · 0.75mm/px · z∈[-67,+85]mm · 4 of 52 slices shown]
[im 1/52]
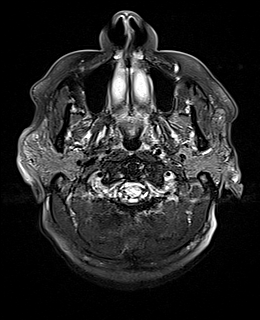
[im 18/52]
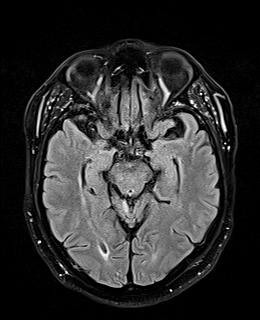
[im 35/52]
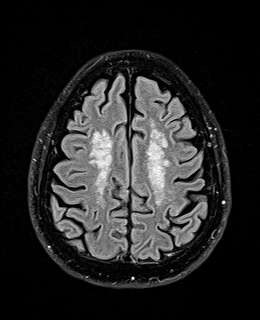
[im 52/52]
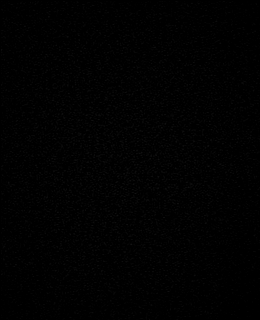

[Series 12: T1 · axial · 1.0mm · 0.94mm/px · z∈[-60,+82]mm · 12 of 144 slices shown (2 of 2)]
[im 1/144]
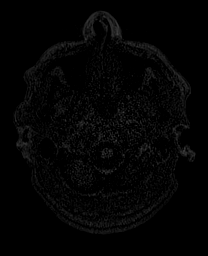
[im 14/144]
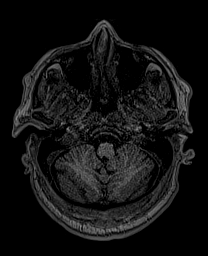
[im 27/144]
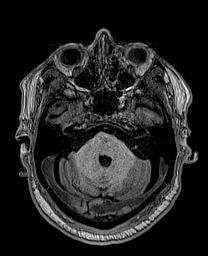
[im 40/144]
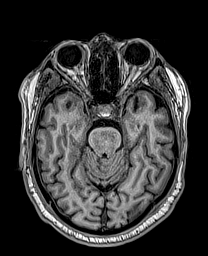
[im 53/144]
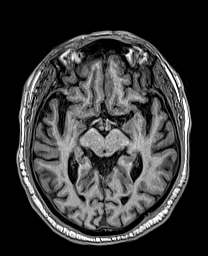
[im 66/144]
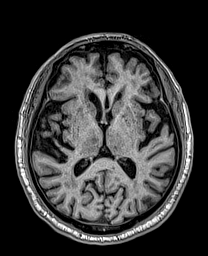
[im 79/144]
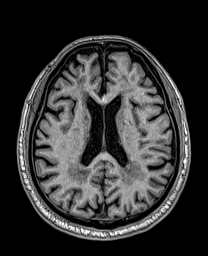
[im 92/144]
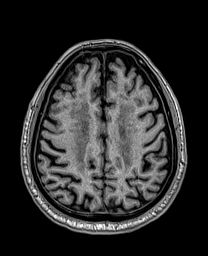
[im 105/144]
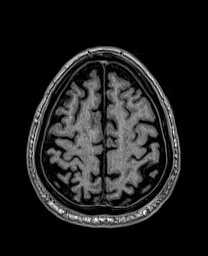
[im 118/144]
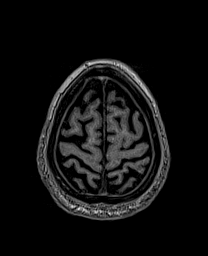
[im 131/144]
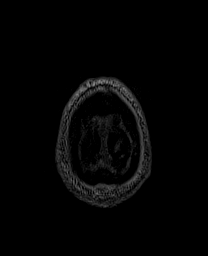
[im 144/144]
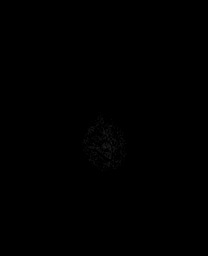

[Series 13: cor dwi_tracew · coronal · 5.0mm · 1.53mm/px · 5 of 60 slices shown]
[im 1/60]
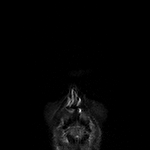
[im 15/60]
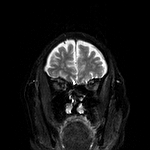
[im 30/60]
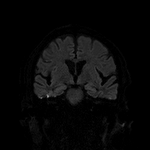
[im 45/60]
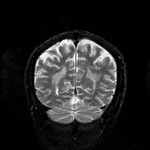
[im 60/60]
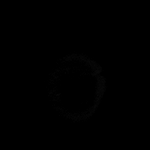

[Series 14: cor dwi_adc · coronal · 5.0mm · 1.53mm/px · 3 of 30 slices shown]
[im 1/30]
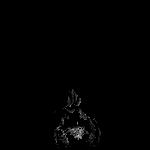
[im 15/30]
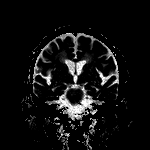
[im 30/30]
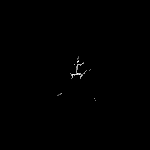

[Series 15: T2 · coronal · 5.0mm · 0.57mm/px · 3 of 30 slices shown (2 of 2)]
[im 1/30]
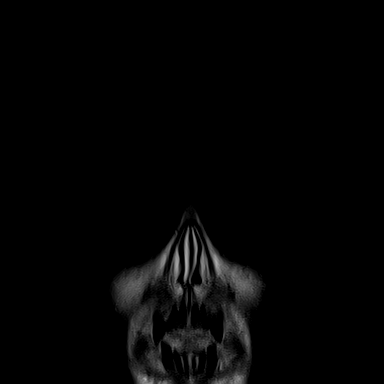
[im 15/30]
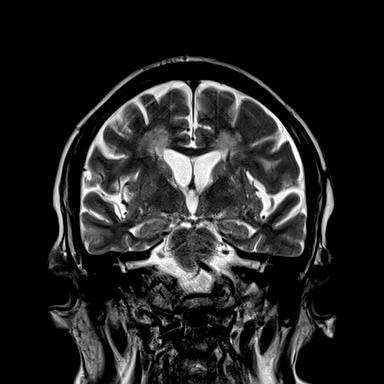
[im 30/30]
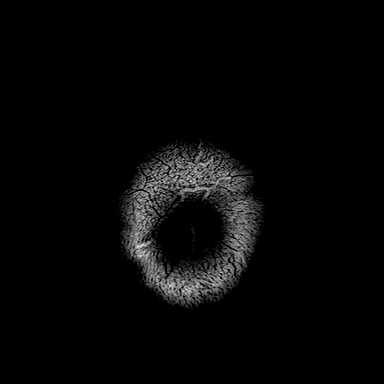

[48 of 48 positions shown; findings below may reference images not displayed]

FINDINGS: Brain: Multiple small foci of restricted diffusion with ADC
correlates in the cerebral cortex bilaterally, as well as cerebral
white matter, right thalamus, and bilateral cerebellar hemispheres,
likely acute infarcts, with additional increased signal on
diffusion-weighted imaging without ADC correlate in the vermis and
left aspect of the splenium of the corpus callosum, likely subacute
infarcts. No evidence of hemorrhage, mass, mass effect, or midline
shift. Confluent T2 hyperintense signal in the periventricular white
matter and pons, likely the sequela of severe chronic small vessel
ischemic disease.

Vascular: Normal flow voids.

Skull and upper cervical spine: Normal marrow signal.

Sinuses/Orbits: No acute finding. Status post bilateral lens
replacements.

Other: The mastoids are well aerated.
IMPRESSION: Multiple small foci of restricted diffusion throughout the bilateral
cerebral and cerebellar hemispheres, as well as the right thalamus,
consistent with acute infarcts. Additional subacute infarcts in the
vermis and left aspect of the splenium of the corpus callosum. Given
different vascular territories, an embolic etiology is favored.

These results were called by telephone at the time of interpretation
on [DATE] at [DATE] to provider DANIAL , who verbally
acknowledged these results.

## 2021-09-16 MED ORDER — IOHEXOL 350 MG/ML SOLN
80.0000 mL | Freq: Once | INTRAVENOUS | Status: AC | PRN
  Administered 2021-09-16: 80 mL via INTRAVENOUS

## 2021-09-16 MED ORDER — STROKE: EARLY STAGES OF RECOVERY BOOK
Freq: Once | Status: AC
  Filled 2021-09-16: qty 1

## 2021-09-16 MED ORDER — CLOPIDOGREL BISULFATE 75 MG PO TABS
75.0000 mg | ORAL_TABLET | Freq: Every day | ORAL | Status: DC
Start: 2021-09-16 — End: 2021-09-19
  Administered 2021-09-16 – 2021-09-18 (×3): 75 mg via ORAL
  Filled 2021-09-16 (×3): qty 1

## 2021-09-16 MED ORDER — POLYETHYLENE GLYCOL 3350 17 G PO PACK: 17.0000 g | PACK | Freq: Every day | ORAL | Status: DC | PRN

## 2021-09-16 MED ORDER — ONDANSETRON HCL 4 MG PO TABS: 4.0000 mg | ORAL_TABLET | Freq: Four times a day (QID) | ORAL | Status: DC | PRN

## 2021-09-16 MED ORDER — ONDANSETRON HCL 4 MG/2ML IJ SOLN: 4.0000 mg | Freq: Four times a day (QID) | INTRAMUSCULAR | Status: DC | PRN

## 2021-09-16 MED ORDER — ENOXAPARIN SODIUM 40 MG/0.4ML IJ SOSY
40.0000 mg | PREFILLED_SYRINGE | INTRAMUSCULAR | Status: DC
  Administered 2021-09-16 – 2021-09-18 (×3): 40 mg via SUBCUTANEOUS
  Filled 2021-09-16 (×3): qty 0.4

## 2021-09-16 MED ORDER — ASPIRIN 325 MG PO TABS
325.0000 mg | ORAL_TABLET | Freq: Once | ORAL | Status: AC
  Administered 2021-09-16: 325 mg via ORAL
  Filled 2021-09-16: qty 1

## 2021-09-16 MED ORDER — ACETAMINOPHEN 325 MG PO TABS
650.0000 mg | ORAL_TABLET | Freq: Four times a day (QID) | ORAL | Status: DC | PRN
  Administered 2021-09-17 – 2021-09-18 (×2): 650 mg via ORAL
  Filled 2021-09-16 (×2): qty 2

## 2021-09-16 MED ORDER — ACETAMINOPHEN 650 MG RE SUPP: 650.0000 mg | Freq: Four times a day (QID) | RECTAL | Status: DC | PRN

## 2021-09-16 MED ORDER — INSULIN ASPART 100 UNIT/ML IJ SOLN
0.0000 [IU] | Freq: Three times a day (TID) | INTRAMUSCULAR | Status: DC
Start: 2021-09-16 — End: 2021-09-20
  Administered 2021-09-17: 2 [IU] via SUBCUTANEOUS
  Administered 2021-09-17: 3 [IU] via SUBCUTANEOUS
  Administered 2021-09-17: 8 [IU] via SUBCUTANEOUS
  Administered 2021-09-18: 5 [IU] via SUBCUTANEOUS
  Administered 2021-09-18: 8 [IU] via SUBCUTANEOUS
  Administered 2021-09-18 – 2021-09-19 (×3): 2 [IU] via SUBCUTANEOUS
  Administered 2021-09-19: 3 [IU] via SUBCUTANEOUS
  Filled 2021-09-16: qty 0.15

## 2021-09-16 MED ORDER — LATANOPROST 0.005 % OP SOLN
1.0000 [drp] | Freq: Every day | OPHTHALMIC | Status: DC
  Administered 2021-09-16 – 2021-09-18 (×3): 1 [drp] via OPHTHALMIC
  Filled 2021-09-16 (×2): qty 2.5

## 2021-09-16 MED ORDER — HYDRALAZINE HCL 20 MG/ML IJ SOLN: 10.0000 mg | Freq: Four times a day (QID) | INTRAMUSCULAR | Status: DC | PRN

## 2021-09-16 MED ORDER — ASPIRIN EC 81 MG PO TBEC
81.0000 mg | DELAYED_RELEASE_TABLET | Freq: Every day | ORAL | Status: DC
  Administered 2021-09-17 – 2021-09-18 (×2): 81 mg via ORAL
  Filled 2021-09-16 (×2): qty 1

## 2021-09-16 MED ORDER — PRAVASTATIN SODIUM 40 MG PO TABS
20.0000 mg | ORAL_TABLET | Freq: Every day | ORAL | Status: DC
  Administered 2021-09-17 – 2021-09-18 (×2): 20 mg via ORAL
  Filled 2021-09-16 (×2): qty 1

## 2021-09-16 MED ORDER — TAMSULOSIN HCL 0.4 MG PO CAPS
0.4000 mg | ORAL_CAPSULE | Freq: Every day | ORAL | Status: DC
  Administered 2021-09-16 – 2021-09-19 (×4): 0.4 mg via ORAL
  Filled 2021-09-16 (×4): qty 1

## 2021-09-16 NOTE — Assessment & Plan Note (Signed)
   Continue home regimen of Rapaflo

## 2021-09-16 NOTE — ED Provider Notes (Signed)
Emergency Medicine Provider Triage Evaluation Note  Robert Warner , a 80 y.o. male  was evaluated in triage.  Pt complains of loss of balance.  Started at 6:30 AM this morning, the symptoms have been constant throughout the day.  He states that he feels off balance, has an unsteady gait.  Denies that the room is spinning, feels that he cannot keep his balance as he walks.  Wife states that he was using his computer this AM and correctly which is atypical for him, she describes them as confused for those few minutes.  Not on blood thinners, no history of prior stroke, TIA, blood clots, MI.  Review of Systems  Positive: Loss of balance Negative: Unilateral weakness, dysarthria  Physical Exam  Ht 5\' 10"  (1.778 m)   Wt 90.7 kg   BMI 28.70 kg/m  Gen:   Awake, no distress   Resp:  Normal effort  MSK:   Moves extremities without difficulty unsteady gait Other:  Cranial nerves III through XII grossly intact, no pronator drift.  Sensation to light touch is grossly intact to the upper and lower extremities.  Grip strength equal bilaterally, dorsiflexion plantarflexion strength intact.    Medical Decision Making  Medically screening exam initiated at 3:43 PM.  Appropriate orders placed.  Robert Warner was informed that the remainder of the evaluation will be completed by another provider, this initial triage assessment does not replace that evaluation, and the importance of remaining in the ED until their evaluation is complete.  No neurodeficits, outside of stroke window from initial onset of symptoms. Concern for posterior circulation stroke, will order MRI.    Sherrill Raring, PA-C 09/16/21 1550    Lacretia Leigh, MD 09/17/21 [phone]

## 2021-09-16 NOTE — H&P (Signed)
History and Physical    Robert Warner UEK:[id number] DOB: [birth year] DOA: 09/16/2021  PCP: [name], MD  Patient coming from: Home   Chief Complaint:  Chief Complaint  Patient presents with   Balance Issues     HPI:    80 year old male with past medical history of diabetes mellitus type 2, hypertension, hyperlipidemia and prostatic hyperplasia with recent identification of multiple liver masses via MRI of liver performed on 10/22 evaluation ongoing [facility] with sudden onset of unsteady gait.  History is been obtained from the patient as well as the wife who is at the bedside.  Patient explains that he woke up this morning at approximately 9 AM and immediately realized that he was having difficulty with his balance upon getting out of bed.  Patient describes it as "just losing control of my legs."  Patient reports that upon going to sleep last night he had no new neurologic symptoms.  Wife reports that this morning patient also appeared intermittently confused.  Patient does not exhibit any associated facial droop, slurring of words, headaches, changes in vision or focal weakness otherwise.    Patient symptoms persisted into the early afternoon after which the patient called his primary care provider.  After finding there was no availability for same-day visit the patient presented to [facility] emergency department for evaluation.    Upon evaluation in the emergency department initial noncontrast CT imaging of the brain was unremarkable.  This was followed up with a noncontrast MRI of the brain revealing multiple small foci of restricted diffusion throughout the bilateral cerebral and cerebellar hemispheres as well as the right thalamus consistent with acute infarct with additional subacute infarct in the vermis and left aspect of the splenium of the corpus callosum.  Radiology suspects an embolic etiology.  tPA was not  administered due to patient being outside of the tPA window.  ER provider discussed case with Dr. Leonel Ramsay with neurology who recommended transfer the patient to [facility] for face-to-face neurology consultation.  The hospitalist group has now been called to assess the patient for admission to the hospital.  Review of Systems:   Review of Systems  Neurological:  Positive for weakness.       Loss of balance  All other systems reviewed and are negative.  Past Medical History:  Diagnosis Date   Arthritis    Diabetes mellitus    Hyperlipidemia    Hypertension     Past Surgical History:  Procedure Laterality Date   APPENDECTOMY  1968   CATARACT EXTRACTION Bilateral 07/2000   states 2 weeks ago (first of sept) OD due in Dec    COLONOSCOPY  2004,2009   Negative, Dr. Sharlett Iles    KIDNEY STONE SURGERY  10/2018   PROSTATE BIOPSY  2006   Dr.Sigmund Tannebaum     reports that he quit smoking about 54 years ago. His smoking use included cigarettes. He has a 5.00 pack-year smoking history. He has never used smokeless tobacco. He reports current alcohol use of about 2.0 standard drinks per week. He reports that he does not use drugs.  No Known Allergies  Family History  Problem Relation Age of Onset   Stroke Father 59   Hypertension Mother    Coronary artery disease Mother    Diabetes Maternal Aunt    Pancreatic cancer Brother    Stroke Paternal Grandmother    Breast cancer Paternal Aunt    Coronary artery disease  Paternal Aunt    Coronary artery disease Maternal Grandmother    Coronary artery disease Maternal Uncle        2 Maternal Uncles    Diabetes Maternal Uncle    Stroke Sister    Heart disease Neg Hx      Prior to Admission medications   Medication Sig Start Date End Date Taking? Authorizing Provider  Cholecalciferol (VITAMIN D3) 2000 UNITS TABS Take 2,000 Units by mouth daily.   Yes [provider]  metFORMIN (GLUCOPHAGE-XR) 750 MG 24 hr tablet  TAKE 1 TABLET(750 MG) BY MOUTH DAILY WITH BREAKFAST Patient taking differently: Take 750 mg by mouth daily with breakfast. 08/22/21  Yes Burchette, Alinda Sierras, MD  pravastatin (PRAVACHOL) 20 MG tablet TAKE 1 TABLET(20 MG) BY MOUTH DAILY Patient taking differently: Take 20 mg by mouth daily. 08/22/21  Yes Burchette, Alinda Sierras, MD  silodosin (RAPAFLO) 8 MG CAPS capsule Take 8 mg by mouth at bedtime.   Yes [provider]  TRAVATAN Z 0.004 % SOLN ophthalmic solution Place 1 drop into both eyes at bedtime. 11/12/16  Yes [provider]  glucose blood (FREESTYLE LITE) test strip CHECK BLOOD SUGAR ONCE DAILY.  Dx:E11.9 08/04/20   Burchette, Alinda Sierras, MD  Lancets (FREESTYLE) lancets Check blood sugar once daily 03/15/12   Hendricks Limes, MD  Semaglutide (RYBELSUS) 7 MG TABS Take 7 mg by mouth daily. Patient not taking: No sig reported 05/23/21   [name], MD    Physical Exam: Vitals:   09/16/21 1805 09/16/21 1830 09/16/21 2037 09/16/21 2100  BP: (!) 165/120 (!) 176/115 (!) 177/106 (!) 188/113  Pulse: 69 70 73 71  Resp: 15 13  16   Temp:    98 F (36.7 C)  TempSrc:    Oral  SpO2: 99% 99% 100% 99%  Weight:      Height:        Constitutional: Awake alert and oriented x3, no associated distress.   Skin: no rashes, no lesions, good skin turgor noted. Eyes: Pupils are equally reactive to light.  No evidence of scleral icterus or conjunctival pallor.  ENMT: Moist mucous membranes noted.  Posterior pharynx clear of any exudate or lesions.   Neck: normal, supple, no masses, no thyromegaly.  No evidence of jugular venous distension.   Respiratory: clear to auscultation bilaterally, no wheezing, no crackles. Normal respiratory effort. No accessory muscle use.  Cardiovascular: Regular rate and rhythm, no murmurs / rubs / gallops. No extremity edema. 2+ pedal pulses. No carotid bruits.  Chest:   Nontender without crepitus or deformity.   Back:   Nontender without crepitus or  deformity. Abdomen: Abdomen is soft and nontender.  No evidence of intra-abdominal masses.  Positive bowel sounds noted in all quadrants.   Musculoskeletal: No joint deformity upper and lower extremities. Good ROM, no contractures. Normal muscle tone.  Neurologic: CN 2-12 grossly intact. Sensation intact.  Patient moving all 4 extremities spontaneously.  Patient is following all commands.  Patient is responsive to verbal stimuli.   Psychiatric: Patient exhibits normal mood with appropriate affect.  Patient seems to possess insight as to their current situation.     Labs on Admission: I have personally reviewed following labs and imaging studies -   CBC: Recent Labs  Lab 09/13/21 0719 09/16/21 1607 09/16/21 1617  WBC 5.6 4.2  --   NEUTROABS 2.9 2.1  --   HGB 14.0 13.5 14.3  HCT 42.9 41.4 42.0  MCV 85.7 86.8  --  PLT 162.0 162  --    Basic Metabolic Panel: Recent Labs  Lab 09/13/21 0719 09/16/21 1607 09/16/21 1617  NA 138 138 140  K 4.7 5.0 4.9  CL 100 104 103  CO2 29 28  --   GLUCOSE 186* 162* 160*  BUN 16 19 20   CREATININE 1.23 1.05 1.20  CALCIUM 9.6 9.2  --    GFR: Estimated Creatinine Clearance: 53.8 mL/min (by C-G formula based on SCr of 1.2 mg/dL). Liver Function Tests: Recent Labs  Lab 09/13/21 0719 09/16/21 1607  AST 25 28  ALT 15 19  ALKPHOS 80 69  BILITOT 1.2 0.7  PROT 7.1 7.0  ALBUMIN 4.1 3.5   No results for input(s): LIPASE, AMYLASE in the last 168 hours. Recent Labs  Lab 09/16/21 1903  AMMONIA 23   Coagulation Profile: Recent Labs  Lab 09/16/21 1607  INR 1.1   Cardiac Enzymes: No results for input(s): CKTOTAL, CKMB, CKMBINDEX, TROPONINI in the last 168 hours. BNP (last 3 results) No results for input(s): PROBNP in the last 8760 hours. HbA1C: No results for input(s): HGBA1C in the last 72 hours. CBG: No results for input(s): GLUCAP in the last 168 hours. Lipid Profile: No results for input(s): CHOL, HDL, LDLCALC, TRIG, CHOLHDL,  LDLDIRECT in the last 72 hours. Thyroid Function Tests: No results for input(s): TSH, T4TOTAL, FREET4, T3FREE, THYROIDAB in the last 72 hours. Anemia Panel: No results for input(s): VITAMINB12, FOLATE, FERRITIN, TIBC, IRON, RETICCTPCT in the last 72 hours. Urine analysis:    Component Value Date/Time   COLORURINE STRAW (A) 09/16/2021 2140   APPEARANCEUR CLEAR 09/16/2021 2140   LABSPEC 1.009 09/16/2021 2140   PHURINE 7.0 09/16/2021 2140   GLUCOSEU NEGATIVE 09/16/2021 2140   HGBUR SMALL (A) 09/16/2021 2140   HGBUR negative 06/15/2008 0000   BILIRUBINUR NEGATIVE 09/16/2021 2140   KETONESUR NEGATIVE 09/16/2021 2140   PROTEINUR NEGATIVE 09/16/2021 2140   UROBILINOGEN 0.2 06/15/2008 0000   NITRITE NEGATIVE 09/16/2021 2140   LEUKOCYTESUR NEGATIVE 09/16/2021 2140    Radiological Exams on Admission - Personally Reviewed: CT HEAD WO CONTRAST  Result Date: 09/16/2021 CLINICAL DATA:  Altered mental status. EXAM: CT HEAD WITHOUT CONTRAST TECHNIQUE: Contiguous axial images were obtained from the base of the skull through the vertex without intravenous contrast. COMPARISON:  None. FINDINGS: Brain: Mild chronic ischemic white matter disease is noted. No mass effect or midline shift is noted. Ventricular size is within normal limits. There is no evidence of mass lesion, hemorrhage or acute infarction. Vascular: No hyperdense vessel or unexpected calcification. Skull: Normal. Negative for fracture or focal lesion. Sinuses/Orbits: No acute finding. Other: None. IMPRESSION: No acute intracranial abnormality seen. Electronically Signed   By: [name] M.D.   On: 09/16/2021 16:18   MR Brain Wo Contrast (neuro protocol)  Result Date: 09/16/2021 CLINICAL DATA:  Dizziness EXAM: MRI HEAD WITHOUT CONTRAST TECHNIQUE: Multiplanar, multiecho pulse sequences of the brain and surrounding structures were obtained without intravenous contrast. COMPARISON:  No prior MRI, correlation is made with 09/16/2021 CT head  FINDINGS: Brain: Multiple small foci of restricted diffusion with ADC correlates in the cerebral cortex bilaterally, as well as cerebral white matter, right thalamus, and bilateral cerebellar hemispheres, likely acute infarcts, with additional increased signal on diffusion-weighted imaging without ADC correlate in the vermis and left aspect of the splenium of the corpus callosum, likely subacute infarcts. No evidence of hemorrhage, mass, mass effect, or midline shift. Confluent T2 hyperintense signal in the periventricular white matter and pons,  likely the sequela of severe chronic small vessel ischemic disease. Vascular: Normal flow voids. Skull and upper cervical spine: Normal marrow signal. Sinuses/Orbits: No acute finding. Status post bilateral lens replacements. Other: The mastoids are well aerated. IMPRESSION: Multiple small foci of restricted diffusion throughout the bilateral cerebral and cerebellar hemispheres, as well as the right thalamus, consistent with acute infarcts. Additional subacute infarcts in the vermis and left aspect of the splenium of the corpus callosum. Given different vascular territories, an embolic etiology is favored. These results were called by telephone at the time of interpretation on 09/16/2021 at 7:47 pm to provider HALEY SAGE , who verbally acknowledged these results. Electronically Signed   By: Merilyn Baba M.D.   On: 09/16/2021 19:48    EKG: Personally reviewed.  Rhythm is normal sinus rhythm with heart rate of 72 bpm.  No dynamic ST segment changes appreciated.  Assessment/Plan  * Acute embolic stroke [facility]) ED Imaging revealed: MRI brain without contrast with multiple small foci of restricted diffusion throughout the bilateral cerebral and cerebellar hemispheres as well as the right thalamus consistent with acute infarct with additional subacute infarcts in the vermis and left aspect of the splenium of the corpus callosum concerning for embolic etiology. On exam  patient still exhibits a persisting usteady gait I am particularly concerned about a possible embolic etiology of the patient's multiple strokes, especially with a recent identification of multiple masses in the liver in late October of unknown etiology.  Monitoring patient on telemetry to evaluate for any evidence of underlying atrial fibrillation.  Obtaining echocardiogram to identify any evidence of patent foramen ovale or intracardiac thrombus.  Patient will additionally need expedited work-up of patient's liver masses which is being performed as an outpatient. Performing serial neurologic checks Monitoring patient on telemetry Initiating aspirin therapy Daily statin therapy Further imaging to include: CT angiogram of the head and neck Obtaining hemoglobin A1c and lipid panel in the morning Echocardiogram with bubble study in the morning PT, OT, SLP evaluation Permissive hypertension with as needed antihypertensives only to be given if blood pressure greater than 220/115 Neurology following in consultation.   Liver masses  MRI performed on October 22 revealing multifocal rim enhancing lesions identified throughout both lobes of liver worrisome for metastatic disease. Patient undergoing expected outpatient work-up including IR guided biopsy of liver lesions as an outpatient Patient will likely additionally need GI evaluation and endoscopic work-up as an outpatient. I am concerned that an unidentified malignancy may be the cause of the patient's multiple embolic strokes.   Type 2 diabetes mellitus without complication, without long-term current use of insulin (HCC) Patient been placed on Accu-Cheks before every meal and nightly with sliding scale insulin Holding home regimen of hypoglycemics Hemoglobin A1C ordered Diabetic Diet   Essential hypertension Permissive hypertension  As needed intravenous antihypertensives for markedly elevated blood pressure greater than 220/115.  Mixed  diabetic hyperlipidemia associated with type 2 diabetes mellitus ([place]) Continuing home regimen of lipid lowering therapy. Obtaining lipid panel in the morning, will titrate statin therapy if LDL greater than 70.  BPH (benign prostatic hyperplasia) Continue home regimen of Rapaflo     Code Status:  Full code  code status decision has been confirmed with: patient Family Communication: Wife is at the bedside and has been updated on plan of care  Status is: Inpatient  Remains inpatient appropriate because: Multiple bilateral strokes thought to be embolic in etiology with possibility of underlying undiagnosed malignancy or initiation of antiplatelet therapy," monitoring with serial  neurologic checks on telemetry, evaluation via PT, OT, SLP and expert consultation with neurology.        Vernelle Emerald MD [facility] Pager [phone]  If 7PM-7AM, please contact night-coverage [email or web address] Use universal [place] password for that web site. If you do not have the password, please call the hospital operator.  09/16/2021, 10:14 PM

## 2021-09-16 NOTE — ED Triage Notes (Signed)
Pt presents after waking this a.m. about 0900 and having "balance issues"  Pt states he feels like he is going to fall but hasn't. No changes in vision or n/v/d.  Pt does say he is having a "biopsy" on Tuesday but isn't sure what for.  Pt's wife is better historian.

## 2021-09-16 NOTE — Assessment & Plan Note (Signed)
   ED Imaging revealed: MRI brain without contrast with multiple small foci of restricted diffusion throughout the bilateral cerebral and cerebellar hemispheres as well as the right thalamus consistent with acute infarct with additional subacute infarcts in the vermis and left aspect of the splenium of the corpus callosum concerning for embolic etiology.  On exam patient still exhibits a persisting usteady gait  I am particularly concerned about a possible embolic etiology of the patient's multiple strokes, especially with a recent identification of multiple masses in the liver in late October of unknown etiology.  Monitoring patient on telemetry to evaluate for any evidence of underlying atrial fibrillation.  Obtaining echocardiogram to identify any evidence of patent foramen ovale or intracardiac thrombus.  Patient will additionally need expedited work-up of patient's liver masses which is being performed as an outpatient.  Performing serial neurologic checks  Monitoring patient on telemetry  Initiating aspirin therapy  Daily statin therapy  Further imaging to include: CT angiogram of the head and neck Obtaining hemoglobin A1c and lipid panel in the morning  Echocardiogram with bubble study in the morning  PT, OT, SLP evaluation  Permissive hypertension with as needed antihypertensives only to be given if blood pressure greater than 220/115  Neurology following in consultation.

## 2021-09-16 NOTE — ED Provider Notes (Signed)
[place] DEPT Provider Note   CSN: [id number] Arrival date & time: 09/16/21  1520     History Chief Complaint  Patient presents with   Balance Issues    Robert Warner is a 80 y.o. male past medical history of diabetes, hypertension, arthritis who presents with 1 day of dizziness, balance issues when attempting to walk.  Patient reports that he woke up later than normal at around 9-9 30 this morning, feeling off balance, unsteady gait.  Patient denies room spinning, reports that he was trying to control his legs but was unable to.  Wife also reports that he was somewhat confused this morning for a few minutes.  No history of prior stroke, TIA, blood clots, MI.  Patient did recently have change in his diabetes medication, but his sugars have been largely under control. Patient does reports some chronic GI pain, not acutely worse or different today. Patient reports some lesions on his liver that are being further evaluated with MRI at some time in the next week. Denies nausea, vomiting, diarrhea, appetite change, heavy alcohol use.  HPI     Past Medical History:  Diagnosis Date   Arthritis    Diabetes mellitus    Hyperlipidemia    Hypertension     Patient Active Problem List   Diagnosis Date Noted   Elevated blood pressure reading without diagnosis of hypertension 03/12/2012   Type 2 diabetes mellitus, controlled ([place]) 06/15/2008   Hyperlipidemia 06/15/2008   BPH (benign prostatic hyperplasia) 06/15/2008    Past Surgical History:  Procedure Laterality Date   APPENDECTOMY  1968   CATARACT EXTRACTION Left 07/2000   states 2 weeks ago (first of sept) OD due in Dec    COLONOSCOPY  2004,2009   Negative, Dr. Sharlett Iles    KIDNEY STONE SURGERY  10/2018   PROSTATE BIOPSY  2006   Dr.Sigmund Tannebaum       Family History  Problem Relation Age of Onset   Stroke Father 76   Hypertension Mother    Coronary artery disease Mother    Diabetes  Maternal Aunt    Pancreatic cancer Brother    Stroke Paternal Grandmother    Breast cancer Paternal Aunt    Coronary artery disease Paternal Aunt    Coronary artery disease Maternal Grandmother    Coronary artery disease Maternal Uncle        2 Maternal Uncles    Diabetes Maternal Uncle    Stroke Sister    Heart disease Neg Hx     Social History   Tobacco Use   Smoking status: Former    Packs/day: 0.50    Years: 10.00    Pack years: 5.00    Types: Cigarettes    Quit date: 11/13/1966    Years since quitting: 54.8   Smokeless tobacco: Never  Vaping Use   Vaping Use: Never used  Substance Use Topics   Alcohol use: Yes    Alcohol/week: 2.0 standard drinks    Types: 2 Glasses of wine per week    Comment: Occasional wine or beer   Drug use: No    Home Medications Prior to Admission medications   Medication Sig Start Date End Date Taking? Authorizing Provider  Cholecalciferol (VITAMIN D3) 2000 UNITS TABS Take by mouth daily.    [provider]  glucose blood (FREESTYLE LITE) test strip CHECK BLOOD SUGAR ONCE DAILY.  Dx:E11.9 08/04/20   Burchette, Alinda Sierras, MD  Lancets (FREESTYLE) lancets Check blood sugar once  daily 03/15/12   Hendricks Limes, MD  metFORMIN (GLUCOPHAGE-XR) 750 MG 24 hr tablet TAKE 1 TABLET(750 MG) BY MOUTH DAILY WITH BREAKFAST 08/22/21   Burchette, Alinda Sierras, MD  pravastatin (PRAVACHOL) 20 MG tablet TAKE 1 TABLET(20 MG) BY MOUTH DAILY 08/22/21   Burchette, Alinda Sierras, MD  Semaglutide (RYBELSUS) 7 MG TABS Take 7 mg by mouth daily. 05/23/21   Burchette, Alinda Sierras, MD  silodosin (RAPAFLO) 8 MG CAPS capsule Take 8 mg by mouth at bedtime.    [provider]  TRAVATAN Z 0.004 % SOLN ophthalmic solution Place 1 drop into both eyes. 11/12/16   [provider]  vitamin B-12 (CYANOCOBALAMIN) 1000 MCG tablet Take 1,000 mcg by mouth daily. Patient not taking: Reported on 08/29/2021    [provider]    Allergies    Patient has no known  allergies.  Review of Systems   Review of Systems  Neurological:        Balance issue  All other systems reviewed and are negative.  Physical Exam Updated Vital Signs BP (!) 165/120   Pulse 69   Resp 15   Ht 5\' 10"  (1.778 m)   Wt 90.7 kg   SpO2 99%   BMI 28.70 kg/m   Physical Exam Vitals and nursing note reviewed.  Constitutional:      General: He is not in acute distress.    Appearance: Normal appearance.  HENT:     Head: Normocephalic and atraumatic.  Eyes:     General:        Right eye: No discharge.        Left eye: No discharge.  Cardiovascular:     Rate and Rhythm: Normal rate and regular rhythm.     Heart sounds: No murmur heard.   No friction rub. No gallop.  Pulmonary:     Effort: Pulmonary effort is normal.     Breath sounds: Normal breath sounds.  Abdominal:     General: Bowel sounds are normal.     Palpations: Abdomen is soft.     Comments: No TTP or mass palpated in the abdomen  Skin:    General: Skin is warm and dry.     Capillary Refill: Capillary refill takes less than 2 seconds.  Neurological:     Mental Status: He is alert and oriented to person, place, and time.     Comments: CN3-12 grossly intact, intact strength 5/5 in BIL UE/LE. Aox3. No pronator drift. Intact finger to nose / heel to shin. Some minor step out during romberg, but able to ambulate without much trouble, some step hesitancy  Psychiatric:        Mood and Affect: Mood normal.        Behavior: Behavior normal.    ED Results / Procedures / Treatments   Labs (all labs ordered are listed, but only abnormal results are displayed) Labs Reviewed  COMPREHENSIVE METABOLIC PANEL - Abnormal; Notable for the following components:      Result Value   Glucose, Bld 162 (*)    All other components within normal limits  I-STAT CHEM 8, ED - Abnormal; Notable for the following components:   Glucose, Bld 160 (*)    All other components within normal limits  RESP PANEL BY RT-PCR (FLU A&B,  COVID) ARPGX2  ETHANOL  PROTIME-INR  APTT  CBC  DIFFERENTIAL  RAPID URINE DRUG SCREEN, HOSP PERFORMED  URINALYSIS, ROUTINE W REFLEX MICROSCOPIC    EKG EKG Interpretation  Date/Time:  Friday September 16 2021 15:46:24 EDT Ventricular Rate:  72 PR Interval:  165 QRS Duration: 84 QT Interval:  376 QTC Calculation: 412 R Axis:   -61 Text Interpretation: Sinus rhythm Ventricular premature complex Consider left atrial enlargement Left anterior fascicular block No previous ECGs available Confirmed by Wandra Arthurs ([id number]) on 09/16/2021 4:04:28 PM  Radiology CT HEAD WO CONTRAST  Result Date: 09/16/2021 CLINICAL DATA:  Altered mental status. EXAM: CT HEAD WITHOUT CONTRAST TECHNIQUE: Contiguous axial images were obtained from the base of the skull through the vertex without intravenous contrast. COMPARISON:  None. FINDINGS: Brain: Mild chronic ischemic white matter disease is noted. No mass effect or midline shift is noted. Ventricular size is within normal limits. There is no evidence of mass lesion, hemorrhage or acute infarction. Vascular: No hyperdense vessel or unexpected calcification. Skull: Normal. Negative for fracture or focal lesion. Sinuses/Orbits: No acute finding. Other: None. IMPRESSION: No acute intracranial abnormality seen. Electronically Signed   By: [name] M.D.   On: 09/16/2021 16:18    Procedures Procedures   Medications Ordered in ED Medications - No data to display  ED Course  I have reviewed the triage vital signs and the nursing notes.  Pertinent labs & imaging results that were available during my care of the patient were reviewed by me and considered in my medical decision making (see chart for details).     MDM Rules/Calculators/A&P                         I discussed this case with my attending physician who cosigned this note including patient's presenting symptoms, physical exam, and planned diagnostics and interventions. Attending physician stated  agreement with plan or made changes to plan which were implemented.   Attending physician assessed patient at bedside.  Overall well-appearing patient, with complaint of focal neurodeficit during this morning, with the length of time techniques simple TIA unlikely.  Worry about posterior circulation stroke given patient's significant risk factors, hypertension, diabetes, and age.  Initial lab work and CT of the head were unremarkable, slightly elevated glucose expected for diabetic.  MRI does show multiple small infarcts in bilateral cerebral, cerebellar hemispheres, as well as thalamus.  Consistent with embolic source.  Suspicious of carotid atherosclerosis versus cardiovascular source within the heart itself.  Patient remains within permissive hypertension ranges during his visit today.  Patient is outside of tPA range.  Spoke with Dr. Leonel Ramsay with neurology who requests admission, transfer to [facility] for TEE, bubble study, further evaluation of stroke. Consult placed to hospitalist at this time. Hospitalist agrees to admission at this time. Final Clinical Impression(s) / ED Diagnoses Final diagnoses:  None    Rx / DC Orders ED Discharge Orders     None        Dorien Chihuahua 09/16/21 2104    Gareth Morgan, MD 09/17/21 1128

## 2021-09-16 NOTE — Telephone Encounter (Signed)
Pt wife is calling stating her husband balance is off and was transferred to triage line and spoke with abdullah. There is no opening at office

## 2021-09-16 NOTE — Assessment & Plan Note (Signed)
.   Continuing home regimen of lipid lowering therapy. . Obtaining lipid panel in the morning, will titrate statin therapy if LDL greater than 70.

## 2021-09-16 NOTE — Assessment & Plan Note (Signed)
   Permissive hypertension   As needed intravenous antihypertensives for markedly elevated blood pressure greater than 220/115.

## 2021-09-16 NOTE — Assessment & Plan Note (Signed)
    MRI performed on October 22 revealing multifocal rim enhancing lesions identified throughout both lobes of liver worrisome for metastatic disease.  Patient undergoing expected outpatient work-up including IR guided biopsy of liver lesions as an outpatient  Patient will likely additionally need GI evaluation and endoscopic work-up as an outpatient.  I am concerned that an unidentified malignancy may be the cause of the patient's multiple embolic strokes.

## 2021-09-16 NOTE — Assessment & Plan Note (Signed)
.   Patient been placed on Accu-Cheks before every meal and nightly with sliding scale insulin . Holding home regimen of hypoglycemics . Hemoglobin A1C ordered . Diabetic Diet  

## 2021-09-17 ENCOUNTER — Inpatient Hospital Stay (HOSPITAL_COMMUNITY): Payer: Medicare HMO

## 2021-09-17 DIAGNOSIS — I6389 Other cerebral infarction: Secondary | ICD-10-CM

## 2021-09-17 LAB — CBC WITH DIFFERENTIAL/PLATELET
Abs Immature Granulocytes: 0.01 10*3/uL (ref 0.00–0.07)
Basophils Absolute: 0 10*3/uL (ref 0.0–0.1)
Basophils Relative: 1 %
Eosinophils Absolute: 0 10*3/uL (ref 0.0–0.5)
Eosinophils Relative: 1 %
HCT: 42.3 % (ref 39.0–52.0)
Hemoglobin: 14.2 g/dL (ref 13.0–17.0)
Immature Granulocytes: 0 %
Lymphocytes Relative: 30 %
Lymphs Abs: 1.2 10*3/uL (ref 0.7–4.0)
MCH: 28.7 pg (ref 26.0–34.0)
MCHC: 33.6 g/dL (ref 30.0–36.0)
MCV: 85.6 fL (ref 80.0–100.0)
Monocytes Absolute: 0.3 10*3/uL (ref 0.1–1.0)
Monocytes Relative: 7 %
Neutro Abs: 2.5 10*3/uL (ref 1.7–7.7)
Neutrophils Relative %: 61 %
Platelets: 198 10*3/uL (ref 150–400)
RBC: 4.94 MIL/uL (ref 4.22–5.81)
RDW: 13.7 % (ref 11.5–15.5)
WBC: 4 10*3/uL (ref 4.0–10.5)
nRBC: 0 % (ref 0.0–0.2)

## 2021-09-17 LAB — ECHOCARDIOGRAM COMPLETE BUBBLE STUDY
AR max vel: 3.3 cm2
AV Area VTI: 3.14 cm2
AV Area mean vel: 3.23 cm2
AV Mean grad: 2.6 mmHg
AV Peak grad: 5.5 mmHg
Ao pk vel: 1.17 m/s
Area-P 1/2: 3.23 cm2
S' Lateral: 2.9 cm

## 2021-09-17 LAB — HEMOGLOBIN A1C
Hgb A1c MFr Bld: 6.9 % — ABNORMAL HIGH (ref 4.8–5.6)
Mean Plasma Glucose: 151.33 mg/dL

## 2021-09-17 LAB — CBG MONITORING, ED
Glucose-Capillary: 142 mg/dL — ABNORMAL HIGH (ref 70–99)
Glucose-Capillary: 195 mg/dL — ABNORMAL HIGH (ref 70–99)

## 2021-09-17 LAB — GLUCOSE, CAPILLARY
Glucose-Capillary: 88 mg/dL (ref 70–99)
Glucose-Capillary: 260 mg/dL — ABNORMAL HIGH (ref 70–99)

## 2021-09-17 LAB — LIPID PANEL
Cholesterol: 164 mg/dL (ref 0–200)
HDL: 57 mg/dL (ref >40)
LDL Cholesterol: 97 mg/dL (ref 0–99)
Total CHOL/HDL Ratio: 2.9 RATIO
Triglycerides: 49 mg/dL (ref <150)
VLDL: 10 mg/dL (ref 0–40)

## 2021-09-17 MED ORDER — SODIUM CHLORIDE 0.9% FLUSH
10.0000 mL | INTRAVENOUS | Status: DC | PRN
  Administered 2021-09-17: 10 mL via INTRAVENOUS

## 2021-09-17 NOTE — Progress Notes (Signed)
  Echocardiogram 2D Echocardiogram has been performed.  Darlina Sicilian M 09/17/2021, 10:35 AM

## 2021-09-17 NOTE — Progress Notes (Signed)
Pt arrived on unit

## 2021-09-17 NOTE — Consult Note (Signed)
Neurology Consultation  Reason for Consult: MRI brain with multiple small infarctions from multiple vascular territories concerning for embolic etiology Referring Physician: Dr. Thereasa Solo  CC: Transient imbalance  History is obtained from: Patient, Chart review  HPI: Robert Warner is a 80 y.o. male with a medical history significant for type 2 diabetes mellitus, hyperlipidemia, essential hypertension, and arthritis with recent identification of multiple liver masses on MRI 08/2021 pending liver biopsy who presented to [facility] 11/4 for evaluation of imbalance with gait.  Patient states that he was last in his usual state of health on 11/3 when he went to bed at 22:00 and when he woke up 11/4 at around 09:00 he felt imbalance when getting out of the bed.  He states that the room was not spinning but he felt like he was unsteady with his gait and that his feet were not going where they were supposed to be while walking.  Patient states that sometimes he does stumble with walking and felt like this would improve however his balance got progressively worse throughout the day and he called his primary care provider who advised him to be seen in the emergency department immediately. Per chart review, patient's wife endorsed some intermittent confusion as well, though patient does not support this during interview.  Imaging was complete revealing multiple small foci of restricted diffusion throughout the bilateral cerebral and cerebellar hemispheres as well as the right thalamus consistent with acute infarcts with additional subacute infarcts in the vermis and left aspect of the splenium of the corpus callosum concerning for embolic etiology due to the different vascular territories involved.  Neurology was consulted for further evaluation and management.  LKW: 11/3 22:00 TNK given?: no, patient outside of the window for thrombolytic therapy IR Thrombectomy? No, presentation is not consistent with LVO Modified  Rankin Scale: 0-Completely asymptomatic and back to baseline post- stroke  ROS: A complete ROS was performed and is negative except as noted in the HPI.   Past Medical History:  Diagnosis Date   Arthritis    Diabetes mellitus    Hyperlipidemia    Hypertension    Past Surgical History:  Procedure Laterality Date   APPENDECTOMY  1968   CATARACT EXTRACTION Bilateral 07/2000   states 2 weeks ago (first of sept) OD due in Dec    COLONOSCOPY  2004,2009   Negative, Dr. Sharlett Iles    KIDNEY STONE SURGERY  10/2018   PROSTATE BIOPSY  2006   Dr.Sigmund Tannebaum   Family History  Problem Relation Age of Onset   Stroke Father 59   Hypertension Mother    Coronary artery disease Mother    Diabetes Maternal Aunt    Pancreatic cancer Brother    Stroke Paternal Grandmother    Breast cancer Paternal Aunt    Coronary artery disease Paternal Aunt    Coronary artery disease Maternal Grandmother    Coronary artery disease Maternal Uncle        2 Maternal Uncles    Diabetes Maternal Uncle    Stroke Sister    Heart disease Neg Hx    Social History:   reports that he quit smoking about 54 years ago. His smoking use included cigarettes. He has a 5.00 pack-year smoking history. He has never used smokeless tobacco. He reports current alcohol use of about 2.0 standard drinks per week. He reports that he does not use drugs.  Medications  Current Facility-Administered Medications:    acetaminophen (TYLENOL) tablet 650 mg, 650 mg, Oral, Q6H  PRN, 650 mg at 09/17/21 0916 **OR** [DISCONTINUED] acetaminophen (TYLENOL) suppository 650 mg, 650 mg, Rectal, Q6H PRN, Shalhoub, Sherryll Burger, MD   [COMPLETED] aspirin tablet 325 mg, 325 mg, Oral, Once, 325 mg at 09/16/21 2240 **FOLLOWED BY** aspirin EC tablet 81 mg, 81 mg, Oral, Daily, Shalhoub, Sherryll Burger, MD, 81 mg at 09/17/21 1101   clopidogrel (PLAVIX) tablet 75 mg, 75 mg, Oral, Daily, Shalhoub, Sherryll Burger, MD, 75 mg at 09/17/21 1020   enoxaparin (LOVENOX) injection  40 mg, 40 mg, Subcutaneous, Q24H, Shalhoub, Sherryll Burger, MD, 40 mg at 09/16/21 2241   hydrALAZINE (APRESOLINE) injection 10 mg, 10 mg, Intravenous, Q6H PRN, Shalhoub, Sherryll Burger, MD   insulin aspart (novoLOG) injection 0-15 Units, 0-15 Units, Subcutaneous, TID AC & HS, Shalhoub, Sherryll Burger, MD, 3 Units at 09/17/21 1236   latanoprost (XALATAN) 0.005 % ophthalmic solution 1 drop, 1 drop, Both Eyes, QHS, Shalhoub, Sherryll Burger, MD, 1 drop at 09/16/21 2242   ondansetron (ZOFRAN) tablet 4 mg, 4 mg, Oral, Q6H PRN **OR** ondansetron (ZOFRAN) injection 4 mg, 4 mg, Intravenous, Q6H PRN, Shalhoub, Sherryll Burger, MD   polyethylene glycol (MIRALAX / GLYCOLAX) packet 17 g, 17 g, Oral, Daily PRN, Shalhoub, Sherryll Burger, MD   pravastatin (PRAVACHOL) tablet 20 mg, 20 mg, Oral, Daily, Shalhoub, Sherryll Burger, MD, 20 mg at 09/17/21 1020   sodium chloride flush (NS) 0.9 % injection 10 mL, 10 mL, Intravenous, PRN, Shalhoub, Sherryll Burger, MD, 10 mL at 09/17/21 0929   tamsulosin (FLOMAX) capsule 0.4 mg, 0.4 mg, Oral, QPC supper, Shalhoub, Sherryll Burger, MD, 0.4 mg at 09/16/21 2240  Current Outpatient Medications:    Cholecalciferol (VITAMIN D3) 2000 UNITS TABS, Take 2,000 Units by mouth daily., Disp: , Rfl:    metFORMIN (GLUCOPHAGE-XR) 750 MG 24 hr tablet, TAKE 1 TABLET(750 MG) BY MOUTH DAILY WITH BREAKFAST (Patient taking differently: Take 750 mg by mouth daily with breakfast.), Disp: 90 tablet, Rfl: 0   pravastatin (PRAVACHOL) 20 MG tablet, TAKE 1 TABLET(20 MG) BY MOUTH DAILY (Patient taking differently: Take 20 mg by mouth daily.), Disp: 90 tablet, Rfl: 0   silodosin (RAPAFLO) 8 MG CAPS capsule, Take 8 mg by mouth at bedtime., Disp: , Rfl:    TRAVATAN Z 0.004 % SOLN ophthalmic solution, Place 1 drop into both eyes at bedtime., Disp: , Rfl:    glucose blood (FREESTYLE LITE) test strip, CHECK BLOOD SUGAR ONCE DAILY.  Dx:E11.9, Disp: 100 each, Rfl: 3   Lancets (FREESTYLE) lancets, Check blood sugar once daily, Disp: 100 each, Rfl: 3   Semaglutide  (RYBELSUS) 7 MG TABS, Take 7 mg by mouth daily. (Patient not taking: No sig reported), Disp: 90 tablet, Rfl: 3  Exam: Current vital signs: BP 122/83   Pulse 72   Temp 98.1 F (36.7 C) (Oral)   Resp 11   Ht 5\' 10"  (1.778 m)   Wt 90.7 kg   SpO2 100%   BMI 28.70 kg/m  Vital signs in last 24 hours: Temp:  [98 F (36.7 C)-98.1 F (36.7 C)] 98.1 F (36.7 C) (11/05 0055) Pulse Rate:  [66-94] 72 (11/05 1445) Resp:  [11-26] 11 (11/05 1445) BP: (101-188)/(71-120) 122/83 (11/05 1445) SpO2:  [95 %-100 %] 100 % (11/05 1445) Weight:  [90.7 kg] 90.7 kg (11/04 1538)  GENERAL: Awake, alert, in no acute distress Psych: Affect appropriate for situation, patient is calm and cooperative with examination Head: Normocephalic and atraumatic, without obvious abnormality EENT: Normal conjunctivae, dry mucous membranes, no OP obstruction, arcus senilis present bilaterally  LUNGS: Normal respiratory effort. Non-labored breathing on room air.  CV: Regular rate and rhythm on telemetry, no pedal edema noted ABDOMEN: Soft, non-tender, non-distended Extremities: warm, well perfused, without obvious deformity  NEURO:  Mental Status: Awake, alert, and oriented to person, place, time, and situation. He is able to provide a clear and coherent history of present illness. Speech/Language: speech is mildly dysarthric but at baseline per patient  Naming, repetition, fluency, and comprehension intact without aphasia  No neglect is noted Cranial Nerves:  II: PERRL 3 mm/brisk. Visual fields full.  III, IV, VI: EOMI without ptosis, nystagmus, or gaze preference.  V: Sensation is intact to light touch and symmetrical to face.  VII: Face is symmetric resting and smiling.  VIII: Hearing is intact to voice IX, X: Palate elevation is symmetric. Phonation normal.  XI: Normal sternocleidomastoid and trapezius muscle strength XII: Tongue protrudes midline without fasciculations.   Motor: 5/5 strength is all muscle  groups.  Tone is normal. Bulk is normal.  Sensation: Intact to light touch bilaterally in all four extremities. No extinction to DSS present.  Coordination: FTN intact bilaterally. HKS intact bilaterally. No pronator drift. Alternating hand movements.  DTRs: 2+ and symmetric patellae and biceps  Gait: Deferred  NIHSS: 0  Labs I have reviewed labs in epic and the results pertinent to this consultation are: CBC    Component Value Date/Time   WBC 4.0 09/17/2021 0500   RBC 4.94 09/17/2021 0500   HGB 14.2 09/17/2021 0500   HCT 42.3 09/17/2021 0500   PLT 198 09/17/2021 0500   MCV 85.6 09/17/2021 0500   MCH 28.7 09/17/2021 0500   MCHC 33.6 09/17/2021 0500   RDW 13.7 09/17/2021 0500   LYMPHSABS 1.2 09/17/2021 0500   MONOABS 0.3 09/17/2021 0500   EOSABS 0.0 09/17/2021 0500   BASOSABS 0.0 09/17/2021 0500   CMP     Component Value Date/Time   NA 140 09/16/2021 1617   K 4.9 09/16/2021 1617   CL 103 09/16/2021 1617   CO2 28 09/16/2021 1607   GLUCOSE 160 (H) 09/16/2021 1617   BUN 20 09/16/2021 1617   CREATININE 1.20 09/16/2021 1617   CALCIUM 9.2 09/16/2021 1607   PROT 7.0 09/16/2021 1607   ALBUMIN 3.5 09/16/2021 1607   AST 28 09/16/2021 1607   ALT 19 09/16/2021 1607   ALKPHOS 69 09/16/2021 1607   BILITOT 0.7 09/16/2021 1607   GFRNONAA >60 09/16/2021 1607   GFRAA 95 06/15/2008 0000   Lipid Panel     Component Value Date/Time   CHOL 164 09/17/2021 0500   TRIG 49 09/17/2021 0500   TRIG 56 10/02/2006 0939   HDL 57 09/17/2021 0500   CHOLHDL 2.9 09/17/2021 0500   VLDL 10 09/17/2021 0500   LDLCALC 97 09/17/2021 0500   LDLDIRECT 64.7 03/24/2013 0838   Lab Results  Component Value Date   HGBA1C 6.9 (H) 09/17/2021   Imaging I have reviewed the images obtained:  MRI examination of the brain 11/4: Multiple small foci of restricted diffusion throughout the bilateral cerebral and cerebellar hemispheres, as well as the right thalamus, consistent with acute infarcts. Additional  subacute infarcts in the vermis and left aspect of the splenium of the corpus callosum. Given different vascular territories, an embolic etiology is favored.  CT angio head and neck 11/4: 1. No emergent large vessel occlusion or high-grade stenosis of the intracranial or cervical arteries. 2. 9 mm right upper lobe pulmonary nodule. Consider one of the following in 3 months for  both low-risk and high-risk individuals: (a) repeat chest CT, (b) follow-up PET-CT, or (c) tissue sampling. This recommendation follows the consensus statement: Guidelines for Management of Incidental Pulmonary Nodules Detected on CT Images: From the Fleischner Society 2017; Radiology 2017; 284:228-243.  TTE 11/5: 1. Left ventricular ejection fraction, by estimation, is 60 to 65%. The  left ventricle has normal function. The left ventricle has no regional wall motion abnormalities. There is mild left ventricular hypertrophy.  Left ventricular diastolic parameters are consistent with Grade I diastolic dysfunction (impaired relaxation).   2. Right ventricular systolic function is normal. The right ventricular size is normal.   3. The mitral valve is normal in structure. No evidence of mitral valve regurgitation. No evidence of mitral stenosis.   4. The aortic valve is tricuspid. Aortic valve regurgitation is not visualized. No aortic stenosis is present.   5. Aortic dilatation noted. There is mild dilatation of the ascending aorta, measuring 38 mm.   6. Agitated saline contrast bubble study was positive with shunting observed within 3-6 cardiac cycles suggestive of interatrial shunt.   Assessment: 80 y.o. male who presented to [facility] 11/4 for evaluation of acute onset of gait imbalance on waking with progression throughout the day that has since resolved. MRI imaging was obtained with multiple areas of restricted diffusion in various vascular regions concerning for embolic stroke etiology.  - Assessment reveals patient without  ongoing neurologic deficits and an NIHSS of 0.  - Imaging results as above.  - Echocardiogram with evidence of intra-atrial shunt as possible etiology of infarctions. Will evaluate for DVT.  - Stroke risk factors include patient's advanced age, type 2 diabetes mellitus, hyperlipidemia, hypertension, and possible malignancy with MRI evidence of multiple liver masses pending biopsy.   Impression: Acute ischemic strokes - cardioembolic   Recommendations: - Pending transfer to [facility] for further stroke work up and evaluation - Statin for LDL goal of < 70 - Frequent neuro checks - Prophylactic therapy-Antiplatelet med: Aspirin - dose 325mg  PO or 300mg  PR followed by ASA 81 mg PO daily  - Risk factor modification - Telemetry monitoring - PT consult, OT consult, Speech consult -LE Doppler given PFO on echo - Stroke team to follow  Pt seen by NP/Neuro and later by MD. Note/plan to be edited by MD as needed.  Anibal Henderson, AGAC-NP [facility] Pager: [phone]  Attending Neurohospitalist Addendum Patient seen and examined with APP/Resident. Agree with the history and physical as documented above. Agree with the plan as documented, which I helped formulate. I have independently reviewed the chart, obtained history, review of systems and examined the patient.I have personally reviewed pertinent head/neck/spine imaging (CT/MRI). Please feel free to call with any questions.  -- Amie Portland, MD Neurologist [facility] Pager: [phone]

## 2021-09-17 NOTE — Progress Notes (Signed)
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  MRI of the brain revealed multiple small foci of restricted diffusion throughout the bilateral cerebellar and cerebral hemispheres consistent with acute infarct.  The case was discussed with the on-call Neurologist who recommended transfer to [facility] for full stroke evaluation.  Consultants:  Neurology/Stroke Team   Code Status: FULL CODE  Antimicrobials:  None  DVT prophylaxis: Lovenox  Subjective: Resting comfortably in bed.  No new symptoms since presentation.  Feels that his sense of gait instability has improved some.  Denies headache nausea vomiting chest pain or shortness of breath.  Denies dysphagia.  Assessment & Plan:  Acute multifocal strokes -suspected embolic etiology MRI most consistent with multifocal embolic CVA -primary symptom is unsteady gait -TTE pending - ASA and Plavix initiated - continue statin - CTa head and neck w/o large vessel disease identified - A1c and lipid panel pending -PT/OT/SLP evaluations pending -observe permissive hypertension - Neurology to see in consultation once transferred to [facility]   Incidentally noted liver masses First appreciated October 22 -outpatient work-up has been plan to include IR guided biopsy as outpatient 09/20/2021 - no further evaluation indicated during this hospital stay at present  DM2 On oral medication alone at home -monitor on SSI  HTN Practice permissive hypertension in setting of acute CVAs  HLD Continue usual home medical therapy -follow-up lipid  panel  BPH Continue usual home medical therapy   Family Communication: Spoke with his very pleasant wife at bedside Status is: Inpatient   Objective: Blood pressure (!) 168/100, pulse 79, temperature 98.1 F (36.7 C), temperature source Oral, resp. rate 18, height 5\' 10"  (1.778 m), weight 90.7 kg, SpO2 100 %.  Intake/Output Summary (Last 24 hours) at 09/17/2021 1000 Last data filed at 09/17/2021 0900 Gross per 24 hour  Intake --  Output 500 ml  Net -500 ml   Filed Weights   09/16/21 1538  Weight: 90.7 kg    Examination: General: No acute respiratory distress Lungs: Clear to auscultation bilaterally without wheezes or crackles Cardiovascular: Regular rate and rhythm without murmur gallop or rub normal S1 and S2 Abdomen: Nontender, nondistended, soft, bowel sounds positive, no rebound, no ascites, no appreciable mass Extremities: No significant cyanosis, clubbing, or edema bilateral lower extremities Neuro: Alert and oriented x4, cranial nerves II through XII intact bilaterally, 4+5 strength bilateral upper and lower extremities  CBC: Recent Labs  Lab 09/13/21 0719 09/16/21 1607 09/16/21 1617 09/17/21 0500  WBC 5.6 4.2  --  4.0  NEUTROABS 2.9 2.1  --  2.5  HGB 14.0 13.5 14.3 14.2  HCT 42.9 41.4 42.0 42.3  MCV 85.7 86.8  --  85.6  PLT 162.0 162  --  564   Basic Metabolic Panel: Recent Labs  Lab 09/13/21 0719 09/16/21 1607 09/16/21 1617  NA 138 138 140  K 4.7 5.0 4.9  CL 100 104 103  CO2 29 28  --   GLUCOSE 186* 162* 160*  BUN 16 19 20   CREATININE 1.23 1.05 1.20  CALCIUM 9.6 9.2  --    GFR: Estimated  Creatinine Clearance: 53.8 mL/min (by C-G formula based on SCr of 1.2 mg/dL).  Liver Function Tests: Recent Labs  Lab 09/13/21 0719 09/16/21 1607  AST 25 28  ALT 15 19  ALKPHOS 80 69  BILITOT 1.2 0.7  PROT 7.1 7.0  ALBUMIN 4.1 3.5    Recent Labs  Lab 09/16/21 1903  AMMONIA 23    Coagulation Profile: Recent Labs  Lab 09/16/21 1607  INR 1.1      HbA1C: Hemoglobin A1C  Date/Time Value Ref Range Status  08/29/2021 08:38 AM 6.9 (A) 4.0 - 5.6 % Final  05/04/2021 07:27 AM 6.3 (A) 4.0 - 5.6 % Final  07/04/2017 12:00 AM 7.4  Final   Hgb A1c MFr Bld  Date/Time Value Ref Range Status  01/28/2018 09:13 AM 7.4 (H) 4.6 - 6.5 % Final    Comment:    Glycemic Control Guidelines for People with Diabetes:Non Diabetic:  <6%Goal of Therapy: <7%Additional Action Suggested:  >8%   06/14/2016 09:53 AM 6.8 (H) 4.6 - 6.5 % Final    Comment:    Glycemic Control Guidelines for People with Diabetes:Non Diabetic:  <6%Goal of Therapy: <7%Additional Action Suggested:  >8%     CBG: Recent Labs  Lab 09/16/21 2303 09/17/21 0751  GLUCAP 97 142*    Recent Results (from the past 240 hour(s))  Resp Panel by RT-PCR (Flu A&B, Covid) Nasopharyngeal Swab     Status: None   Collection Time: 09/16/21  6:00 PM   Specimen: Nasopharyngeal Swab; Nasopharyngeal(NP) swabs in vial transport medium  Result Value Ref Range Status   SARS Coronavirus 2 by RT PCR NEGATIVE NEGATIVE Final    Comment: (NOTE) SARS-CoV-2 target nucleic acids are NOT DETECTED.  The SARS-CoV-2 RNA is generally detectable in upper respiratory specimens during the acute phase of infection. The lowest concentration of SARS-CoV-2 viral copies this assay can detect is 138 copies/mL. A negative result does not preclude SARS-Cov-2 infection and should not be used as the sole basis for treatment or other patient management decisions. A negative result may occur with  improper specimen collection/handling, submission of specimen other than nasopharyngeal swab, presence of viral mutation(s) within the areas targeted by this assay, and inadequate number of viral copies(<138 copies/mL). A negative result must be combined with clinical observations, patient history, and epidemiological information. The expected result is Negative.  Fact Sheet for Patients:   EntrepreneurPulse.com.au  Fact Sheet for Healthcare Providers:  IncredibleEmployment.be  This test is no t yet approved or cleared by the [place] FDA and  has been authorized for detection and/or diagnosis of SARS-CoV-2 by FDA under an Emergency Use Authorization (EUA). This EUA will remain  in effect (meaning this test can be used) for the duration of the COVID-19 declaration under Section 564(b)(1) of the Act, 21 U.S.C.section 360bbb-3(b)(1), unless the authorization is terminated  or revoked sooner.       Influenza A by PCR NEGATIVE NEGATIVE Final   Influenza B by PCR NEGATIVE NEGATIVE Final    Comment: (NOTE) The Xpert Xpress SARS-CoV-2/FLU/RSV plus assay is intended as an aid in the diagnosis of influenza from Nasopharyngeal swab specimens and should not be used as a sole basis for treatment. Nasal washings and aspirates are unacceptable for Xpert Xpress SARS-CoV-2/FLU/RSV testing.  Fact Sheet for Patients: EntrepreneurPulse.com.au  Fact Sheet for Healthcare Providers: IncredibleEmployment.be  This test is not yet approved or cleared by the [place] FDA and has been authorized for detection and/or diagnosis of SARS-CoV-2 by FDA under an Emergency  Use Authorization (EUA). This EUA will remain in effect (meaning this test can be used) for the duration of the COVID-19 declaration under Section 564(b)(1) of the Act, 21 U.S.C. section 360bbb-3(b)(1), unless the authorization is terminated or revoked.  Performed at [facility], [place] [address], [place],  [address]      Scheduled Meds:  aspirin EC  81 mg Oral Daily   clopidogrel  75 mg Oral Daily   enoxaparin (LOVENOX) injection  40 mg Subcutaneous Q24H   insulin aspart  0-15 Units Subcutaneous TID AC & HS   latanoprost  1 drop Both Eyes QHS   pravastatin  20 mg Oral Daily   tamsulosin  0.4 mg Oral QPC supper       LOS: 1 day   Cherene Altes, MD [facility] Office  [phone] Pager - Text Page per Amion  If 7PM-7AM, please contact night-coverage per Amion 09/17/2021, 10:00 AM

## 2021-09-18 ENCOUNTER — Inpatient Hospital Stay (HOSPITAL_COMMUNITY): Payer: Medicare HMO

## 2021-09-18 DIAGNOSIS — I639 Cerebral infarction, unspecified: Secondary | ICD-10-CM

## 2021-09-18 LAB — COMPREHENSIVE METABOLIC PANEL
ALT: 18 U/L (ref 0–44)
AST: 29 U/L (ref 15–41)
Albumin: 3.2 g/dL — ABNORMAL LOW (ref 3.5–5.0)
Alkaline Phosphatase: 68 U/L (ref 38–126)
Anion gap: 5 (ref 5–15)
BUN: 13 mg/dL (ref 8–23)
CO2: 27 mmol/L (ref 22–32)
Calcium: 9 mg/dL (ref 8.9–10.3)
Chloride: 104 mmol/L (ref 98–111)
Creatinine, Ser: 1.28 mg/dL — ABNORMAL HIGH (ref 0.61–1.24)
GFR, Estimated: 56 mL/min — ABNORMAL LOW (ref >60)
Glucose, Bld: 130 mg/dL — ABNORMAL HIGH (ref 70–99)
Potassium: 3.7 mmol/L (ref 3.5–5.1)
Sodium: 136 mmol/L (ref 135–145)
Total Bilirubin: 0.9 mg/dL (ref 0.3–1.2)
Total Protein: 6.3 g/dL — ABNORMAL LOW (ref 6.5–8.1)

## 2021-09-18 LAB — CBC
HCT: 41.4 % (ref 39.0–52.0)
Hemoglobin: 13.6 g/dL (ref 13.0–17.0)
MCH: 27.9 pg (ref 26.0–34.0)
MCHC: 32.9 g/dL (ref 30.0–36.0)
MCV: 85 fL (ref 80.0–100.0)
Platelets: 177 10*3/uL (ref 150–400)
RBC: 4.87 MIL/uL (ref 4.22–5.81)
RDW: 12.8 % (ref 11.5–15.5)
WBC: 4.9 10*3/uL (ref 4.0–10.5)
nRBC: 0 % (ref 0.0–0.2)

## 2021-09-18 LAB — GLUCOSE, CAPILLARY
Glucose-Capillary: 106 mg/dL — ABNORMAL HIGH (ref 70–99)
Glucose-Capillary: 147 mg/dL — ABNORMAL HIGH (ref 70–99)
Glucose-Capillary: 206 mg/dL — ABNORMAL HIGH (ref 70–99)
Glucose-Capillary: 285 mg/dL — ABNORMAL HIGH (ref 70–99)

## 2021-09-18 LAB — MAGNESIUM: Magnesium: 1.9 mg/dL (ref 1.7–2.4)

## 2021-09-18 MED ORDER — PRAVASTATIN SODIUM 40 MG PO TABS
40.0000 mg | ORAL_TABLET | Freq: Every day | ORAL | Status: DC
  Administered 2021-09-19: 40 mg via ORAL
  Filled 2021-09-18: qty 1

## 2021-09-18 NOTE — Evaluation (Addendum)
Physical Therapy Evaluation Patient Details Name: Robert Warner MRN: [id number] DOB: [birth year] Today's Date: 09/18/2021  History of Present Illness  80 yo male presents to [facility] hospital on 09/16/2021 with instability. MRI demonstrates multiple infarcts in cerebral and cerebellar territories most likely embolic CVA. PMHx: arhtritis, HTN, DM, HLD.  Clinical Impression  Pt presents to PT with deficits in high level gait and balance tasks. Pt demonstrates increased sway with head turns or with eyes closed at this time. Pt scores 18/24 on DGI, indicating an increased risk for falls. Pt will benefit from continued acute PT services to reduce falls risk and to aide in a return to independence. PT recommends discharge home with outpatient PT services.       Recommendations for follow up therapy are one component of a multi-disciplinary discharge planning process, led by the attending physician.  Recommendations may be updated based on patient status, additional functional criteria and insurance authorization.  Follow Up Recommendations Outpatient PT    Assistance Recommended at Discharge PRN  Functional Status Assessment Patient has had a recent decline in their functional status and demonstrates the ability to make significant improvements in function in a reasonable and predictable amount of time.  Equipment Recommendations  None recommended by PT    Recommendations for Other Services       Precautions / Restrictions Precautions Precautions: Fall Restrictions Weight Bearing Restrictions: No      Mobility  Bed Mobility Overal bed mobility: Independent                  Transfers Overall transfer level: Independent Equipment used: None Transfers: Sit to/from Stand Sit to Stand: Independent           General transfer comment: catches self, but takes a few steps backwards everytime he stands or transfers    Ambulation/Gait Ambulation/Gait assistance: Supervision Gait  Distance (Feet): 600 Feet Assistive device: None Gait Pattern/deviations: Step-through pattern Gait velocity: functional Gait velocity interpretation: >2.62 ft/sec, indicative of community ambulatory General Gait Details: pt with steady step-through gait, performs head turns, changes gait speed, changes step length, turns quickyl and stops abruptly all without loss of balance  Stairs            Wheelchair Mobility    Modified Rankin (Stroke Patients Only) Modified Rankin (Stroke Patients Only) Pre-Morbid Rankin Score: No symptoms Modified Rankin: No significant disability     Balance Overall balance assessment: Needs assistance Sitting-balance support: No upper extremity supported;Feet supported Sitting balance-Leahy Scale: Good     Standing balance support: No upper extremity supported;During functional activity Standing balance-Leahy Scale: Good           Rhomberg - Eyes Opened: 20 (stopped test at 20 seconds) Rhomberg - Eyes Closed: 20 (stopped test at 20 seconds)     Standardized Balance Assessment Standardized Balance Assessment : Dynamic Gait Index   Dynamic Gait Index Level Surface: Normal Change in Gait Speed: Normal Gait with Horizontal Head Turns: Mild Impairment Gait with Vertical Head Turns: Mild Impairment Gait and Pivot Turn: Mild Impairment Step Over Obstacle: Mild Impairment Step Around Obstacles: Mild Impairment Steps: Mild Impairment Total Score: 18       Pertinent Vitals/Pain Pain Assessment: No/denies pain    Home Living Family/patient expects to be discharged to:: Private residence Living Arrangements: Spouse/significant other Available Help at Discharge: Family;Available 24 hours/day Type of Home: House Home Access: Stairs to enter Entrance Stairs-Rails: Can reach both Entrance Stairs-Number of Steps: garage 6 steps w/ railing; front steps  10 steps b/l railing Alternate Level Stairs-Number of Steps: full flight does not use  upstairs Home Layout: Two level;Able to live on main level with bedroom/bathroom Home Equipment: Shower seat;Grab bars - toilet;Grab bars - tub/shower;Adaptive equipment;Rolling Walker (2 wheels) Additional Comments: does not use AD    Prior Function Prior Level of Function : Independent/Modified Independent             Mobility Comments: no AD; driving; avid golfer ADLs Comments: independent     Hand Dominance   Dominant Hand: Right    Extremity/Trunk Assessment   Upper Extremity Assessment Upper Extremity Assessment: Overall WFL for tasks assessed    Lower Extremity Assessment Lower Extremity Assessment: Overall WFL for tasks assessed    Cervical / Trunk Assessment Cervical / Trunk Assessment: Normal  Communication   Communication: No difficulties  Cognition Arousal/Alertness: Awake/alert Behavior During Therapy: WFL for tasks assessed/performed Overall Cognitive Status: Within Functional Limits for tasks assessed                                 General Comments: Short Blesses test with no errors.        General Comments General comments (skin integrity, edema, etc.): VSS on RA    Exercises     Assessment/Plan    PT Assessment Patient needs continued PT services  PT Problem List Decreased balance       PT Treatment Interventions Balance training;Neuromuscular re-education;Gait training    PT Goals (Current goals can be found in the Care Plan section)  Acute Rehab PT Goals Patient Stated Goal: to go home and improve balance PT Goal Formulation: With patient Time For Goal Achievement: 10/02/21 Potential to Achieve Goals: Good Additional Goals Additional Goal #1: Pt will demonstrate the ability to maintain SLS with eyes closed for >10 seconds with each leg    Frequency Min 4X/week   Barriers to discharge        Co-evaluation               AM-PAC PT "6 Clicks" Mobility  Outcome Measure Help needed turning from your back to  your side while in a flat bed without using bedrails?: None Help needed moving from lying on your back to sitting on the side of a flat bed without using bedrails?: None Help needed moving to and from a bed to a chair (including a wheelchair)?: None Help needed standing up from a chair using your arms (e.g., wheelchair or bedside chair)?: None Help needed to walk in hospital room?: A Little Help needed climbing 3-5 steps with a railing? : A Little 6 Click Score: 22    End of Session   Activity Tolerance: Patient tolerated treatment well Patient left: in chair;with call bell/phone within reach Nurse Communication: Mobility status PT Visit Diagnosis: Unsteadiness on feet (R26.81)    Time: 1700-1749 PT Time Calculation (min) (ACUTE ONLY): 15 min   Charges:   PT Evaluation $PT Eval Low Complexity: [place], PT, DPT Acute Rehabilitation Pager: [phone] Office [phone]   Zenaida Niece 09/18/2021, 10:29 AM

## 2021-09-18 NOTE — Progress Notes (Signed)
[facility]  PROGRESS NOTE  Robert Warner VPX:[id number] DOB: 10-24-1941 DOA: 09/16/2021 PCP: [name], MD   Brief HPI:    80 year old male with history of diabetes mellitus type 2, hypertension, hyperlipidemia, BPH who is currently undergoing valuation for incidentally discovered multiple liver masses on MRI from 10/22 came to ED with acute onset of unsteady gait first appreciated when he woke up from sleep on 11/4 at 9 AM  In the ED CT head showed no acute findings.  MRI brain revealed multiple small foci of restricted diffusion throughout the bilateral cerebellar and cerebral hemispheres consistent with acute infarct.  Neurology was consulted.  Patient was transferred [facility] [place].    Subjective   Patient seen and examined, denies any complaints.   Assessment/Plan:    Acute multifocal embolic strokes -MRI shows multifocal embolic CVA -Echocardiogram shows intra atrial shunt as possibility of infarctions; neurology is planning to do transcranial Doppler with bubble study  -Lower extremity venous duplex is negative for DVT -Hemoglobin A1c is 6.9, LDL 97 -Patient currently on aspirin and Plavix -Neurology following -PT recommends outpatient PT   Incidental liver masses -First noted on September 03, 2021, outpatient work-up -Plan was to get IR guided liver biopsy as outpatient on 09/20/2021 -Patient plans to go for the procedure if discharged home in a.m.  Pulmonary nodule -9 mm right upper lobe pulmonary nodule seen on CTA head and neck -Recommend to get repeat CT chest in 3 months -Patient's PCP has arranged for liver biopsy, can follow-up with the PCP for further recommendations regarding pulmonary nodule.  Diabetes mellitus type 2 -CBG well controlled -Continue sliding scale insulin NovoLog  Hypertension -Blood pressure well controlled -Allow permissive hypertension  Hyperlipidemia -Continue pravastatin -LDL is 97 as  above  BPH -Continue tamsulosin  CKD stage III -Creatinine 1.28 -Likely at baseline    Scheduled medications:    aspirin EC  81 mg Oral Daily   clopidogrel  75 mg Oral Daily   enoxaparin (LOVENOX) injection  40 mg Subcutaneous Q24H   insulin aspart  0-15 Units Subcutaneous TID AC & HS   latanoprost  1 drop Both Eyes QHS   [START ON 09/19/2021] pravastatin  40 mg Oral Daily   tamsulosin  0.4 mg Oral QPC supper     Data Reviewed:   CBG:  Recent Labs  Lab 09/17/21 1818 09/17/21 2121 09/18/21 0609 09/18/21 1239 09/18/21 1518  GLUCAP 88 260* 106* 147* 206*    SpO2: 98 %    Vitals:   09/18/21 0400 09/18/21 0806 09/18/21 1239 09/18/21 1520  BP: (!) 142/90 130/85 (!) 139/106 137/86  Pulse: 81 85 72 77  Resp: 19 18 18 18   Temp: 98.2 F (36.8 C) (!) 97.5 F (36.4 C) 98.7 F (37.1 C) 98 F (36.7 C)  TempSrc:  Oral Oral Oral  SpO2: 100% 100% 100% 98%  Weight:      Height:         Intake/Output Summary (Last 24 hours) at 09/18/2021 1523 Last data filed at 09/18/2021 0400 Gross per 24 hour  Intake 60 ml  Output 625 ml  Net -565 ml    11/04 1901 - 11/06 0700 In: 60 [P.O.:60] Out: 1125 [Urine:1125]  Filed Weights   09/16/21 1538  Weight: 90.7 kg    Data Reviewed: Basic Metabolic Panel: Recent Labs  Lab 09/13/21 0719 09/16/21 1607 09/16/21 1617 09/18/21 0020  NA 138 138 140 136  K 4.7 5.0 4.9 3.7  CL  100 104 103 104  CO2 29 28  --  27  GLUCOSE 186* 162* 160* 130*  BUN 16 19 20 13   CREATININE 1.23 1.05 1.20 1.28*  CALCIUM 9.6 9.2  --  9.0  MG  --   --   --  1.9   Liver Function Tests: Recent Labs  Lab 09/13/21 0719 09/16/21 1607 09/18/21 0020  AST 25 28 29   ALT 15 19 18   ALKPHOS 80 69 68  BILITOT 1.2 0.7 0.9  PROT 7.1 7.0 6.3*  ALBUMIN 4.1 3.5 3.2*   No results for input(s): LIPASE, AMYLASE in the last 168 hours. Recent Labs  Lab 09/16/21 1903  AMMONIA 23   CBC: Recent Labs  Lab 09/13/21 0719 09/16/21 1607 09/16/21 1617  09/17/21 0500 09/18/21 0020  WBC 5.6 4.2  --  4.0 4.9  NEUTROABS 2.9 2.1  --  2.5  --   HGB 14.0 13.5 14.3 14.2 13.6  HCT 42.9 41.4 42.0 42.3 41.4  MCV 85.7 86.8  --  85.6 85.0  PLT 162.0 162  --  198 177   Cardiac Enzymes: No results for input(s): CKTOTAL, CKMB, CKMBINDEX, TROPONINI in the last 168 hours. BNP (last 3 results) No results for input(s): BNP in the last 8760 hours.  ProBNP (last 3 results) No results for input(s): PROBNP in the last 8760 hours.  CBG: Recent Labs  Lab 09/17/21 1818 09/17/21 2121 09/18/21 0609 09/18/21 1239 09/18/21 1518  GLUCAP 88 260* 106* 147* 206*       Radiology Reports  CT ANGIO HEAD W OR WO CONTRAST  Result Date: 09/16/2021 CLINICAL DATA:  Dizziness EXAM: CT ANGIOGRAPHY HEAD AND NECK TECHNIQUE: Multidetector CT imaging of the head and neck was performed using the standard protocol during bolus administration of intravenous contrast. Multiplanar CT image reconstructions and MIPs were obtained to evaluate the vascular anatomy. Carotid stenosis measurements (when applicable) are obtained utilizing NASCET criteria, using the distal internal carotid diameter as the denominator. CONTRAST:  62mL OMNIPAQUE IOHEXOL 350 MG/ML SOLN COMPARISON:  None. FINDINGS: CTA NECK FINDINGS SKELETON: There is no bony spinal canal stenosis. No lytic or blastic lesion. OTHER NECK: Normal pharynx, larynx and major salivary glands. No cervical lymphadenopathy. Unremarkable thyroid gland. UPPER CHEST: Right upper lobe nodule measures 9 mm (series 5, image 8). AORTIC ARCH: There is no calcific atherosclerosis of the aortic arch. There is no aneurysm, dissection or hemodynamically significant stenosis of the visualized portion of the aorta. Conventional 3 vessel aortic branching pattern. The visualized proximal subclavian arteries are widely patent. RIGHT CAROTID SYSTEM: Normal without aneurysm, dissection or stenosis. LEFT CAROTID SYSTEM: Normal without aneurysm, dissection  or stenosis. VERTEBRAL ARTERIES: Left dominant configuration. Both origins are clearly patent. There is no dissection, occlusion or flow-limiting stenosis to the skull base (V1-V3 segments). CTA HEAD FINDINGS POSTERIOR CIRCULATION: --Vertebral arteries: Normal V4 segments. --Inferior cerebellar arteries: Normal. --Basilar artery: Normal. --Superior cerebellar arteries: Normal. --Posterior cerebral arteries (PCA): Normal. ANTERIOR CIRCULATION: --Intracranial internal carotid arteries: Normal. --Anterior cerebral arteries (ACA): Normal. Both A1 segments are present. Patent anterior communicating artery (a-comm). --Middle cerebral arteries (MCA): Normal. VENOUS SINUSES: As permitted by contrast timing, patent. ANATOMIC VARIANTS: None Review of the MIP images confirms the above findings. IMPRESSION: 1. No emergent large vessel occlusion or high-grade stenosis of the intracranial or cervical arteries. 2. 9 mm right upper lobe pulmonary nodule. Consider one of the following in 3 months for both low-risk and high-risk individuals: (a) repeat chest CT, (b) follow-up PET-CT, or (c) tissue  sampling. This recommendation follows the consensus statement: Guidelines for Management of Incidental Pulmonary Nodules Detected on CT Images: From the Fleischner Society 2017; Radiology 2017; 284:228-243. Electronically Signed   By: Ulyses Jarred M.D.   On: 09/16/2021 22:52   CT HEAD WO CONTRAST  Result Date: 09/16/2021 CLINICAL DATA:  Altered mental status. EXAM: CT HEAD WITHOUT CONTRAST TECHNIQUE: Contiguous axial images were obtained from the base of the skull through the vertex without intravenous contrast. COMPARISON:  None. FINDINGS: Brain: Mild chronic ischemic white matter disease is noted. No mass effect or midline shift is noted. Ventricular size is within normal limits. There is no evidence of mass lesion, hemorrhage or acute infarction. Vascular: No hyperdense vessel or unexpected calcification. Skull: Normal. Negative for  fracture or focal lesion. Sinuses/Orbits: No acute finding. Other: None. IMPRESSION: No acute intracranial abnormality seen. Electronically Signed   By: [name] M.D.   On: 09/16/2021 16:18   CT ANGIO NECK W OR WO CONTRAST  Result Date: 09/16/2021 CLINICAL DATA:  Dizziness EXAM: CT ANGIOGRAPHY HEAD AND NECK TECHNIQUE: Multidetector CT imaging of the head and neck was performed using the standard protocol during bolus administration of intravenous contrast. Multiplanar CT image reconstructions and MIPs were obtained to evaluate the vascular anatomy. Carotid stenosis measurements (when applicable) are obtained utilizing NASCET criteria, using the distal internal carotid diameter as the denominator. CONTRAST:  77mL OMNIPAQUE IOHEXOL 350 MG/ML SOLN COMPARISON:  None. FINDINGS: CTA NECK FINDINGS SKELETON: There is no bony spinal canal stenosis. No lytic or blastic lesion. OTHER NECK: Normal pharynx, larynx and major salivary glands. No cervical lymphadenopathy. Unremarkable thyroid gland. UPPER CHEST: Right upper lobe nodule measures 9 mm (series 5, image 8). AORTIC ARCH: There is no calcific atherosclerosis of the aortic arch. There is no aneurysm, dissection or hemodynamically significant stenosis of the visualized portion of the aorta. Conventional 3 vessel aortic branching pattern. The visualized proximal subclavian arteries are widely patent. RIGHT CAROTID SYSTEM: Normal without aneurysm, dissection or stenosis. LEFT CAROTID SYSTEM: Normal without aneurysm, dissection or stenosis. VERTEBRAL ARTERIES: Left dominant configuration. Both origins are clearly patent. There is no dissection, occlusion or flow-limiting stenosis to the skull base (V1-V3 segments). CTA HEAD FINDINGS POSTERIOR CIRCULATION: --Vertebral arteries: Normal V4 segments. --Inferior cerebellar arteries: Normal. --Basilar artery: Normal. --Superior cerebellar arteries: Normal. --Posterior cerebral arteries (PCA): Normal. ANTERIOR  CIRCULATION: --Intracranial internal carotid arteries: Normal. --Anterior cerebral arteries (ACA): Normal. Both A1 segments are present. Patent anterior communicating artery (a-comm). --Middle cerebral arteries (MCA): Normal. VENOUS SINUSES: As permitted by contrast timing, patent. ANATOMIC VARIANTS: None Review of the MIP images confirms the above findings. IMPRESSION: 1. No emergent large vessel occlusion or high-grade stenosis of the intracranial or cervical arteries. 2. 9 mm right upper lobe pulmonary nodule. Consider one of the following in 3 months for both low-risk and high-risk individuals: (a) repeat chest CT, (b) follow-up PET-CT, or (c) tissue sampling. This recommendation follows the consensus statement: Guidelines for Management of Incidental Pulmonary Nodules Detected on CT Images: From the Fleischner Society 2017; Radiology 2017; 284:228-243. Electronically Signed   By: Ulyses Jarred M.D.   On: 09/16/2021 22:52   MR Brain Wo Contrast (neuro protocol)  Result Date: 09/16/2021 CLINICAL DATA:  Dizziness EXAM: MRI HEAD WITHOUT CONTRAST TECHNIQUE: Multiplanar, multiecho pulse sequences of the brain and surrounding structures were obtained without intravenous contrast. COMPARISON:  No prior MRI, correlation is made with 09/16/2021 CT head FINDINGS: Brain: Multiple small foci of restricted diffusion with ADC correlates in the cerebral  cortex bilaterally, as well as cerebral white matter, right thalamus, and bilateral cerebellar hemispheres, likely acute infarcts, with additional increased signal on diffusion-weighted imaging without ADC correlate in the vermis and left aspect of the splenium of the corpus callosum, likely subacute infarcts. No evidence of hemorrhage, mass, mass effect, or midline shift. Confluent T2 hyperintense signal in the periventricular white matter and pons, likely the sequela of severe chronic small vessel ischemic disease. Vascular: Normal flow voids. Skull and upper cervical  spine: Normal marrow signal. Sinuses/Orbits: No acute finding. Status post bilateral lens replacements. Other: The mastoids are well aerated. IMPRESSION: Multiple small foci of restricted diffusion throughout the bilateral cerebral and cerebellar hemispheres, as well as the right thalamus, consistent with acute infarcts. Additional subacute infarcts in the vermis and left aspect of the splenium of the corpus callosum. Given different vascular territories, an embolic etiology is favored. These results were called by telephone at the time of interpretation on 09/16/2021 at 7:47 pm to provider HALEY SAGE , who verbally acknowledged these results. Electronically Signed   By: Merilyn Baba M.D.   On: 09/16/2021 19:48   ECHOCARDIOGRAM COMPLETE BUBBLE STUDY  Result Date: 09/17/2021    ECHOCARDIOGRAM REPORT   Patient Name:   Robert Warner Date of Exam: 09/17/2021 Medical Rec #:  [id number]       Height:       70.0 in Accession #:    [id number]      Weight:       200.0 lb Date of Birth:  [birth year]       BSA:          2.087 m Patient Age:    1 years        BP:           133/95 mmHg Patient Gender: M               HR:           67 bpm. Exam Location:  Inpatient Procedure: 2D Echo, Cardiac Doppler, Color Doppler and Intracardiac            Opacification Agent Indications:    Stroke 434.91 / I63.9  History:        Patient has no prior history of Echocardiogram examinations.                 Risk Factors:Hypertension, Diabetes and Dyslipidemia.  Sonographer:    Darlina Sicilian RDCS Referring Phys: [id number] [place]  1. Left ventricular ejection fraction, by estimation, is 60 to 65%. The left ventricle has normal function. The left ventricle has no regional wall motion abnormalities. There is mild left ventricular hypertrophy. Left ventricular diastolic parameters are consistent with Grade I diastolic dysfunction (impaired relaxation).  2. Right ventricular systolic function is normal. The right ventricular  size is normal.  3. The mitral valve is normal in structure. No evidence of mitral valve regurgitation. No evidence of mitral stenosis.  4. The aortic valve is tricuspid. Aortic valve regurgitation is not visualized. No aortic stenosis is present.  5. Aortic dilatation noted. There is mild dilatation of the ascending aorta, measuring 38 mm.  6. Agitated saline contrast bubble study was positive with shunting observed within 3-6 cardiac cycles suggestive of interatrial shunt. FINDINGS  Left Ventricle: Left ventricular ejection fraction, by estimation, is 60 to 65%. The left ventricle has normal function. The left ventricle has no regional wall motion abnormalities. Definity contrast agent was given IV to delineate the left  ventricular  endocardial borders. The left ventricular internal cavity size was normal in size. There is mild left ventricular hypertrophy. Left ventricular diastolic parameters are consistent with Grade I diastolic dysfunction (impaired relaxation). Normal left ventricular filling pressure. Right Ventricle: The right ventricular size is normal. No increase in right ventricular wall thickness. Right ventricular systolic function is normal. Left Atrium: Left atrial size was normal in size. Right Atrium: Right atrial size was normal in size. Pericardium: There is no evidence of pericardial effusion. Mitral Valve: The mitral valve is normal in structure. No evidence of mitral valve regurgitation. No evidence of mitral valve stenosis. Tricuspid Valve: The tricuspid valve is normal in structure. Tricuspid valve regurgitation is not demonstrated. No evidence of tricuspid stenosis. Aortic Valve: The aortic valve is tricuspid. Aortic valve regurgitation is not visualized. No aortic stenosis is present. Aortic valve mean gradient measures 2.6 mmHg. Aortic valve peak gradient measures 5.5 mmHg. Aortic valve area, by VTI measures 3.14 cm. Pulmonic Valve: The pulmonic valve was not well visualized. Pulmonic  valve regurgitation is not visualized. No evidence of pulmonic stenosis. Aorta: The aortic root is normal in size and structure and aortic dilatation noted. There is mild dilatation of the ascending aorta, measuring 38 mm. IAS/Shunts: The interatrial septum was not well visualized. Agitated saline contrast was given intravenously to evaluate for intracardiac shunting. Agitated saline contrast bubble study was positive with shunting observed within 3-6 cardiac cycles suggestive of interatrial shunt.  LEFT VENTRICLE PLAX 2D LVIDd:         4.00 cm   Diastology LVIDs:         2.90 cm   LV e' medial:    4.68 cm/s LV PW:         1.00 cm   LV E/e' medial:  12.0 LV IVS:        1.20 cm   LV e' lateral:   5.77 cm/s LVOT diam:     2.40 cm   LV E/e' lateral: 9.8 LV SV:         74 LV SV Index:   35 LVOT Area:     4.52 cm  RIGHT VENTRICLE RV S prime:     12.80 cm/s LEFT ATRIUM             Index       RIGHT ATRIUM          Index LA diam:        3.10 cm 1.49 cm/m  RA Area:     7.64 cm LA Vol (A2C):   15.3 ml 7.33 ml/m  RA Volume:   10.60 ml 5.08 ml/m LA Vol (A4C):   15.2 ml 7.28 ml/m LA Biplane Vol: 16.2 ml 7.76 ml/m  AORTIC VALVE AV Area (Vmax):    3.30 cm AV Area (Vmean):   3.23 cm AV Area (VTI):     3.14 cm AV Vmax:           117.28 cm/s AV Vmean:          74.266 cm/s AV VTI:            0.235 m AV Peak Grad:      5.5 mmHg AV Mean Grad:      2.6 mmHg LVOT Vmax:         85.52 cm/s LVOT Vmean:        53.105 cm/s LVOT VTI:          0.163 m LVOT/AV VTI ratio: 0.69  AORTA Ao  Root diam: 3.80 cm Ao Asc diam:  3.80 cm MITRAL VALVE MV Area (PHT): 3.23 cm    SHUNTS MV Decel Time: 235 msec    Systemic VTI:  0.16 m MV E velocity: 56.30 cm/s  Systemic Diam: 2.40 cm MV A velocity: 99.40 cm/s MV E/A ratio:  0.57 Carlyle Dolly MD Electronically signed by Carlyle Dolly MD Signature Date/Time: 09/17/2021/11:35:46 AM    Final    VAS [place] LOWER EXTREMITY VENOUS (DVT)  Result Date: 09/18/2021  Lower Venous DVT Study Patient Name:   Robert Warner  Date of Exam:   09/18/2021 Medical Rec #: [id number]        Accession #:    [id number] Date of Birth: [birth year]        Patient Gender: M Patient Age:   21 years Exam Location:  [facility] Procedure:      VAS [place] LOWER EXTREMITY VENOUS (DVT) Referring Phys: JEFFREY MCCLUNG --------------------------------------------------------------------------------  Indications: Stroke.  Comparison Study: No prior study on file Performing Technologist: Sharion Dove RVS  Examination Guidelines: A complete evaluation includes B-mode imaging, spectral Doppler, color Doppler, and power Doppler as needed of all accessible portions of each vessel. Bilateral testing is considered an integral part of a complete examination. Limited examinations for reoccurring indications may be performed as noted. The reflux portion of the exam is performed with the patient in reverse Trendelenburg.  +---------+---------------+---------+-----------+----------+--------------+ RIGHT    CompressibilityPhasicitySpontaneityPropertiesThrombus Aging +---------+---------------+---------+-----------+----------+--------------+ CFV      Full           Yes      Yes                                 +---------+---------------+---------+-----------+----------+--------------+ SFJ      Full                                                        +---------+---------------+---------+-----------+----------+--------------+ FV Prox  Full                                                        +---------+---------------+---------+-----------+----------+--------------+ FV Mid   Full                                                        +---------+---------------+---------+-----------+----------+--------------+ FV DistalFull                                                        +---------+---------------+---------+-----------+----------+--------------+ PFV      Full                                                         +---------+---------------+---------+-----------+----------+--------------+  POP      Full           Yes      Yes                                 +---------+---------------+---------+-----------+----------+--------------+ PTV      Full                                                        +---------+---------------+---------+-----------+----------+--------------+ PERO     Full                                                        +---------+---------------+---------+-----------+----------+--------------+   +---------+---------------+---------+-----------+----------+--------------+ LEFT     CompressibilityPhasicitySpontaneityPropertiesThrombus Aging +---------+---------------+---------+-----------+----------+--------------+ CFV      Full           Yes      Yes                                 +---------+---------------+---------+-----------+----------+--------------+ SFJ      Full                                                        +---------+---------------+---------+-----------+----------+--------------+ FV Prox  Full                                                        +---------+---------------+---------+-----------+----------+--------------+ FV Mid   Full                                                        +---------+---------------+---------+-----------+----------+--------------+ FV DistalFull                                                        +---------+---------------+---------+-----------+----------+--------------+ PFV      Full                                                        +---------+---------------+---------+-----------+----------+--------------+ POP      Full           No       Yes                                 +---------+---------------+---------+-----------+----------+--------------+  PTV      Full                                                         +---------+---------------+---------+-----------+----------+--------------+ PERO     Full                                                        +---------+---------------+---------+-----------+----------+--------------+     Summary: BILATERAL: - No evidence of deep vein thrombosis seen in the lower extremities, bilaterally. Bilateral popliteal, posterior tibial, and peroneal veins are dilated with rouleaux flow, but are easily compressible. -No evidence of popliteal cyst, bilaterally.   *See table(s) above for measurements and observations. Electronically signed by Monica Martinez MD on 09/18/2021 at 11:16:48 AM.    Final        Antibiotics: Anti-infectives (From admission, onward)    None         DVT prophylaxis: Lovenox  Code Status: Full code  Family Communication: No family at bedside   Consultants: Neurology  Procedures: Carotid duplex Echocardiogram    Objective    Physical Examination:   General-appears in no acute distress Heart-S1-S2, regular, no murmur auscultated Lungs-clear to auscultation bilaterally, no wheezing or crackles auscultated Abdomen-soft, nontender, no organomegaly Extremities-no edema in the lower extremities Neuro-alert, oriented x3, no focal deficit noted   Status is: Inpatient  Dispo: The patient is from: Home              Anticipated d/c is to: Home              Anticipated d/c date is: 09/19/2021              Patient currently not stable for discharge  Barrier to discharge-undergoing evaluation for stroke  COVID-19 Labs  No results for input(s): DDIMER, FERRITIN, LDH, CRP in the last 72 hours.  Lab Results  Component Value Date   [place] NEGATIVE 09/16/2021   [place] Not Detected 12/24/2020   SARSCOV2NAA Detected (A) 12/17/2020            Recent Results (from the past 240 hour(s))  Resp Panel by RT-PCR (Flu A&B, Covid) Nasopharyngeal Swab     Status: None   Collection Time: 09/16/21  6:00 PM    Specimen: Nasopharyngeal Swab; Nasopharyngeal(NP) swabs in vial transport medium  Result Value Ref Range Status   SARS Coronavirus 2 by RT PCR NEGATIVE NEGATIVE Final    Comment: (NOTE) SARS-CoV-2 target nucleic acids are NOT DETECTED.  The SARS-CoV-2 RNA is generally detectable in upper respiratory specimens during the acute phase of infection. The lowest concentration of SARS-CoV-2 viral copies this assay can detect is 138 copies/mL. A negative result does not preclude SARS-Cov-2 infection and should not be used as the sole basis for treatment or other patient management decisions. A negative result may occur with  improper specimen collection/handling, submission of specimen other than nasopharyngeal swab, presence of viral mutation(s) within the areas targeted by this assay, and inadequate number of viral copies(<138 copies/mL). A negative result must be combined with clinical observations, patient history, and epidemiological information. The expected result is Negative.  Fact Sheet for Patients:  EntrepreneurPulse.com.au  Fact Sheet for Healthcare Providers:  IncredibleEmployment.be  This test is no t yet approved or cleared by the [place] FDA and  has been authorized for detection and/or diagnosis of SARS-CoV-2 by FDA under an Emergency Use Authorization (EUA). This EUA will remain  in effect (meaning this test can be used) for the duration of the COVID-19 declaration under Section 564(b)(1) of the Act, 21 U.S.C.section 360bbb-3(b)(1), unless the authorization is terminated  or revoked sooner.       Influenza A by PCR NEGATIVE NEGATIVE Final   Influenza B by PCR NEGATIVE NEGATIVE Final    Comment: (NOTE) The Xpert Xpress SARS-CoV-2/FLU/RSV plus assay is intended as an aid in the diagnosis of influenza from Nasopharyngeal swab specimens and should not be used as a sole basis for treatment. Nasal washings and aspirates are  unacceptable for Xpert Xpress SARS-CoV-2/FLU/RSV testing.  Fact Sheet for Patients: EntrepreneurPulse.com.au  Fact Sheet for Healthcare Providers: IncredibleEmployment.be  This test is not yet approved or cleared by the [place] FDA and has been authorized for detection and/or diagnosis of SARS-CoV-2 by FDA under an Emergency Use Authorization (EUA). This EUA will remain in effect (meaning this test can be used) for the duration of the COVID-19 declaration under Section 564(b)(1) of the Act, 21 U.S.C. section 360bbb-3(b)(1), unless the authorization is terminated or revoked.  Performed at [facility], [place] [address] [place], [place] [address]     Oswald Hillock   [facility] If 7PM-7AM, please contact night-coverage at [email or web address], Office  [phone]   09/18/2021, 3:23 PM  LOS: 2 days

## 2021-09-18 NOTE — Progress Notes (Signed)
VASCULAR LAB    Bilateral lower extremity venous duplex has been performed.  See CV proc for preliminary results.   Sanna Porcaro, RVT 09/18/2021, 10:21 AM

## 2021-09-18 NOTE — Progress Notes (Signed)
STROKE TEAM PROGRESS NOTE   INTERVAL HISTORY Patient was seen in room with one family member at the bedside.  He has been hemodynamically stable and is in no acute distress.  He was transferred in from [facility] for investigation of multiple embolic appearing infarcts.  He was found to have an atrial level shunt on echocardiogram yesterday.  Of note, he is scheduled to have a biopsy of a liver mass on Tuesday and does not wish to miss this procedure.  Vitals:   09/17/21 2035 09/18/21 0008 09/18/21 0400 09/18/21 0806  BP: (!) 138/92 120/82 (!) 142/90 130/85  Pulse: 78 71 81 85  Resp: 18 17 19 18   Temp: 98.2 F (36.8 C) 98.1 F (36.7 C) 98.2 F (36.8 C) (!) 97.5 F (36.4 C)  TempSrc: Oral   Oral  SpO2: 100% 100% 100% 100%  Weight:      Height:       CBC:  Recent Labs  Lab 09/16/21 1607 09/16/21 1617 09/17/21 0500 09/18/21 0020  WBC 4.2  --  4.0 4.9  NEUTROABS 2.1  --  2.5  --   HGB 13.5   < > 14.2 13.6  HCT 41.4   < > 42.3 41.4  MCV 86.8  --  85.6 85.0  PLT 162  --  198 177   < > = values in this interval not displayed.   Basic Metabolic Panel:  Recent Labs  Lab 09/16/21 1607 09/16/21 1617 09/18/21 0020  NA 138 140 136  K 5.0 4.9 3.7  CL 104 103 104  CO2 28  --  27  GLUCOSE 162* 160* 130*  BUN 19 20 13   CREATININE 1.05 1.20 1.28*  CALCIUM 9.2  --  9.0  MG  --   --  1.9   Lipid Panel:  Recent Labs  Lab 09/17/21 0500  CHOL 164  TRIG 49  HDL 57  CHOLHDL 2.9  VLDL 10  LDLCALC 97   HgbA1c:  Recent Labs  Lab 09/17/21 0500  HGBA1C 6.9*   Urine Drug Screen:  Recent Labs  Lab 09/16/21 2140  LABOPIA NONE DETECTED  COCAINSCRNUR NONE DETECTED  LABBENZ NONE DETECTED  AMPHETMU NONE DETECTED  THCU NONE DETECTED  LABBARB NONE DETECTED    Alcohol Level  Recent Labs  Lab 09/16/21 1607  ETH <10    IMAGING past 24 hours VAS [place] LOWER EXTREMITY VENOUS (DVT)  Result Date: 09/18/2021  Lower Venous DVT Study Patient Name:  EUSTACE HUR  Date  of Exam:   09/18/2021 Medical Rec #: [id number]        Accession #:    [id number] Date of Birth: 17-Nov-1940        Patient Gender: M Patient Age:   89 years Exam Location:  [facility] Procedure:      VAS [place] LOWER EXTREMITY VENOUS (DVT) Referring Phys: JEFFREY MCCLUNG --------------------------------------------------------------------------------  Indications: Stroke.  Comparison Study: No prior study on file Performing Technologist: Sharion Dove RVS  Examination Guidelines: A complete evaluation includes B-mode imaging, spectral Doppler, color Doppler, and power Doppler as needed of all accessible portions of each vessel. Bilateral testing is considered an integral part of a complete examination. Limited examinations for reoccurring indications may be performed as noted. The reflux portion of the exam is performed with the patient in reverse Trendelenburg.  +---------+---------------+---------+-----------+----------+--------------+ RIGHT    CompressibilityPhasicitySpontaneityPropertiesThrombus Aging +---------+---------------+---------+-----------+----------+--------------+ CFV      Full           Yes  Yes                                 +---------+---------------+---------+-----------+----------+--------------+ SFJ      Full                                                        +---------+---------------+---------+-----------+----------+--------------+ FV Prox  Full                                                        +---------+---------------+---------+-----------+----------+--------------+ FV Mid   Full                                                        +---------+---------------+---------+-----------+----------+--------------+ FV DistalFull                                                        +---------+---------------+---------+-----------+----------+--------------+ PFV      Full                                                         +---------+---------------+---------+-----------+----------+--------------+ POP      Full           Yes      Yes                                 +---------+---------------+---------+-----------+----------+--------------+ PTV      Full                                                        +---------+---------------+---------+-----------+----------+--------------+ PERO     Full                                                        +---------+---------------+---------+-----------+----------+--------------+   +---------+---------------+---------+-----------+----------+--------------+ LEFT     CompressibilityPhasicitySpontaneityPropertiesThrombus Aging +---------+---------------+---------+-----------+----------+--------------+ CFV      Full           Yes      Yes                                 +---------+---------------+---------+-----------+----------+--------------+ SFJ      Full                                                        +---------+---------------+---------+-----------+----------+--------------+  FV Prox  Full                                                        +---------+---------------+---------+-----------+----------+--------------+ FV Mid   Full                                                        +---------+---------------+---------+-----------+----------+--------------+ FV DistalFull                                                        +---------+---------------+---------+-----------+----------+--------------+ PFV      Full                                                        +---------+---------------+---------+-----------+----------+--------------+ POP      Full           No       Yes                                 +---------+---------------+---------+-----------+----------+--------------+ PTV      Full                                                         +---------+---------------+---------+-----------+----------+--------------+ PERO     Full                                                        +---------+---------------+---------+-----------+----------+--------------+     Summary: BILATERAL: - No evidence of deep vein thrombosis seen in the lower extremities, bilaterally. Bilateral popliteal, posterior tibial, and peroneal veins are dilated with rouleaux flow, but are easily compressible. -No evidence of popliteal cyst, bilaterally.   *See table(s) above for measurements and observations.    Preliminary     PHYSICAL EXAM General: Patient is an alert, well-developed male in no acute distress  NEURO:  Mental Status: AA&Ox3  Speech/Language: speech is without dysarthria or aphasia.  Naming, repetition, fluency, and comprehension intact.  Cranial Nerves:  II: PERRL. Visual fields full.  III, IV, VI: EOMI. Eyelids elevate symmetrically.  V: Sensation is intact to light touch and symmetrical to face.  VII: Smile is symmetrical. Able to puff cheeks and raise eyebrows.  VIII: hearing intact to voice. IX, X: Palate elevates symmetrically. Phonation is normal.  SF:SELTRVUY shrug 5/5. XII: tongue is midline without fasciculations. Motor: 5/5 strength to all muscle groups tested.  Tone: is normal and bulk is normal Sensation-  Intact to light touch bilaterally. Extinction absent to light touch to DSS.  Coordination: FTN intact bilaterally, HKS: no ataxia in BLE.No drift.  Gait- deferred    ASSESSMENT/PLAN Mr. SUMEDH SHINSATO is a 80 y.o. male with history of DM, HLD, HTN and a liver mass presenting with imbalance and difficulties with gait.   MRI reveals multiple acute infarcts in bilateral cerebral and cerebellar hemispheres presenting an embolic picture.  Echocardiogram with bubble study reveals an atrial level shunt.  Will plan to perform transcranial doppler with bubble study tomorrow and then consult cardiology to determine course of  action.  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  CTA head & neck No LVO or high grade stenosis, incidental 74mm right pulmonary nodule MRI  Multiple small acute infarcts in bilateral cerebral and cerebellar hemispheres and right thalamus, subacute infarcts in vermis and left splenium of corpus callosum 2D Echo EF 60-65%. Grade 1 diastolic dysfunction, interatrial shunt Venous ultrasound of BLE- no evidence for DVT LDL 97 HgbA1c 6.9 VTE prophylaxis - lovenox    Diet   Diet heart healthy/carb modified Room service appropriate? Yes; Fluid consistency: Thin   No antithrombotic prior to admission, now on aspirin 81 mg daily and clopidogrel 75 mg daily.  Therapy recommendations:  outpatient PT, no OT follow up Disposition:  home  Hypertension Home meds:  none Stable Permissive hypertension (OK if < 220/120) but gradually normalize in 5-7 days Long-term BP goal normotensive  Hyperlipidemia Home meds:  pravastatin 20 mg daily, increase to 40 mg daily LDL 97, goal < 70 High intensity statin continues Continue statin at discharge  Diabetes type II Controlled Home meds:  metformin 750 mg daily, semaglutide 7 mg daily HgbA1c 6.9, goal < 7.0 CBGs Recent Labs    09/17/21 1818 09/17/21 2121 09/18/21 0609  GLUCAP 88 260* 106*    SSI  Likely PFO Echocardiogram with bubble study reveals atrial level shunt Will perform TCD with bubble study tomorrow Contact cardiology after bubble study to discuss options  Other Stroke Risk Factors Advanced Age >/= 52  Former cigarette smoker Family hx stroke (father, grandfather and sister)  Other Active Problems Liver mass Patient is scheduled for outpatient biopsy of liver mass at [place] [facility] on Tuesday If patient will not be discharged in time for this appointment, procedure may be able to be performed while inpatient  Incidental pulmonary  nodule Patient aware of finding, primary team to advise on follow-up  Hospital day # 2  ATTENDING ATTESTATION:  Transfer from [facility] for embolic stroke workup. No deficits on exam. +PFO on echo. Recommend TCD tomorrow. LE doppler neg. He has outpt liver bx planned at [facility] on Tuesday. Plan is to get him discharged so he can make that apt.   Pulmonary nodule on CTA. Discussed with Dr. Darrick Meigs today. He will monitor.  Dr. Reeves Forth evaluated pt independently, reviewed imaging, chart, labs. Discussed and formulated plan with the APP. Please see APP note above for details.   Total 30 minutes spent on counseling patient and coordinating care, writing notes and reviewing chart.    Pratham Cassatt,MD    To contact Stroke Continuity provider, please refer to [email or web address] After hours, contact General Neurology

## 2021-09-18 NOTE — Evaluation (Signed)
Occupational Therapy Evaluation Patient Details Name: Robert Warner MRN: [id number] DOB: 09-25-1941 Today's Date: 09/18/2021   History of Present Illness Pt is an 80 yo male s/p small, multiple infarcts in cerebral and cerebellar territories most likely embolic CVA. PMHx: arhtritis, HTN, DM, HLD.   Clinical Impression   Pt PTA: Pt independent living with spouse. Pt currently, supervisionA advised, but pt aware of stepping backwards and catches himself with transfers and mobility. Pt transferring on/off commode, in and out of shower and performing own ADL routine in standing with supervision. Strength 5/5 in BUEs. Ambulatory in hallway avoiding obstacles and only slight unsteadiness. Pt with no errors on Short Blessed test and A/O x4. No further OT skilled warranted at this time. OT signing off- thank you.     Recommendations for follow up therapy are one component of a multi-disciplinary discharge planning process, led by the attending physician.  Recommendations may be updated based on patient status, additional functional criteria and insurance authorization.   Follow Up Recommendations  No OT follow up    Assistance Recommended at Discharge Frequent or constant Supervision/Assistance  Functional Status Assessment  Patient has had a recent decline in their functional status and demonstrates the ability to make significant improvements in function in a reasonable and predictable amount of time.  Equipment Recommendations  None recommended by OT    Recommendations for Other Services       Precautions / Restrictions Precautions Precautions: Fall Restrictions Weight Bearing Restrictions: No      Mobility Bed Mobility Overal bed mobility: Independent                  Transfers Overall transfer level: Needs assistance Equipment used: None Transfers: Sit to/from Stand Sit to Stand: Supervision           General transfer comment: catches self, but takes a few steps  backwards everytime he stands or transfers      Balance Overall balance assessment: Mild deficits observed, not formally tested                                         ADL either performed or assessed with clinical judgement   ADL Overall ADL's : Modified independent                                       General ADL Comments: SupervisionA advised, but pt aware of stepping backwards and catches himself with transfers. Pt transferring on/off commode, in and out of shower and performing own ADL routine in standing. Strength 5/5 in BUEs.     Vision Baseline Vision/History: 1 Wears glasses Ability to See in Adequate Light: 0 Adequate Patient Visual Report: No change from baseline Vision Assessment?: Yes Eye Alignment: Within Functional Limits Ocular Range of Motion: Within Functional Limits Additional Comments: reading clock, phone and laminated signs     Perception     Praxis      Pertinent Vitals/Pain Pain Assessment: No/denies pain     Hand Dominance Right   Extremity/Trunk Assessment Upper Extremity Assessment Upper Extremity Assessment: Overall WFL for tasks assessed   Lower Extremity Assessment Lower Extremity Assessment: Overall WFL for tasks assessed;Defer to PT evaluation   Cervical / Trunk Assessment Cervical / Trunk Assessment: Normal   Communication Communication Communication: No difficulties  Cognition Arousal/Alertness: Awake/alert Behavior During Therapy: WFL for tasks assessed/performed Overall Cognitive Status: Within Functional Limits for tasks assessed                                 General Comments: Short Blesses test with no errors.     General Comments  Ambulatory in hallway avoiding obstacles and only slight unsteadiness.    Exercises     Shoulder Instructions      Home Living Family/patient expects to be discharged to:: Private residence Living Arrangements: Spouse/significant  other Available Help at Discharge: Family;Available 24 hours/day Type of Home: House Home Access: Stairs to enter [employer] of Steps: garage 6 steps w/ railing; front steps 10 steps b/l railing Entrance Stairs-Rails: Can reach both Home Layout: Two level;Able to live on main level with bedroom/bathroom Alternate Level Stairs-Number of Steps: full flight does not use upstairs Alternate Level Stairs-Rails: Can reach both Bathroom Shower/Tub: Occupational psychologist: Handicapped height     Home Equipment: Shower seat;Grab bars - toilet;Grab bars - tub/shower;Adaptive equipment;Rolling Walker (2 wheels)   Additional Comments: does not use AD      Prior Functioning/Environment Prior Level of Function : Independent/Modified Independent             Mobility Comments: no AD; driving ADLs Comments: independent        OT Problem List: Decreased activity tolerance      OT Treatment/Interventions:      OT Goals(Current goals can be found in the care plan section) Acute Rehab OT Goals Patient Stated Goal: to go home OT Goal Formulation: All assessment and education complete, DC therapy Potential to Achieve Goals: Good  OT Frequency:     Barriers to D/C:            Co-evaluation              AM-PAC OT "6 Clicks" Daily Activity     Outcome Measure Help from another person eating meals?: None Help from another person taking care of personal grooming?: None Help from another person toileting, which includes using toliet, bedpan, or urinal?: A Little Help from another person bathing (including washing, rinsing, drying)?: A Little Help from another person to put on and taking off regular upper body clothing?: None Help from another person to put on and taking off regular lower body clothing?: A Little 6 Click Score: 21   End of Session Equipment Utilized During Treatment: Gait belt Nurse Communication: Mobility status  Activity Tolerance:  Patient tolerated treatment well Patient left: in bed;with call bell/phone within reach;Other (comment) (PT in room)  OT Visit Diagnosis: Unsteadiness on feet (R26.81)                Time: 8563-1497 OT Time Calculation (min): 30 min Charges:  OT General Charges $OT Visit: 1 Visit OT Evaluation $OT Eval Moderate Complexity: 1 Mod OT Treatments $Self Care/Home Management : 8-22 mins  Jefferey Pica, OTR/L Acute Rehabilitation Services Pager: [phone] Office: [place] 09/18/2021, 9:15 AM

## 2021-09-18 NOTE — Plan of Care (Signed)
Pt is in bed resting. Denies pain. Pt is ambulatory but unsteady and requires +1 assist.   Problem: Health Behavior/Discharge Planning: Goal: Ability to manage health-related needs will improve Outcome: Progressing   Problem: Clinical Measurements: Goal: Ability to maintain clinical measurements within normal limits will improve Outcome: Progressing Goal: Will remain free from infection Outcome: Progressing Goal: Diagnostic test results will improve Outcome: Progressing Goal: Respiratory complications will improve Outcome: Progressing Goal: Cardiovascular complication will be avoided Outcome: Progressing   Problem: Clinical Measurements: Goal: Ability to maintain clinical measurements within normal limits will improve Outcome: Progressing Goal: Will remain free from infection Outcome: Progressing Goal: Diagnostic test results will improve Outcome: Progressing Goal: Respiratory complications will improve Outcome: Progressing Goal: Cardiovascular complication will be avoided Outcome: Progressing   Problem: Activity: Goal: Risk for activity intolerance will decrease Outcome: Progressing   Problem: Nutrition: Goal: Adequate nutrition will be maintained Outcome: Progressing   Problem: Coping: Goal: Level of anxiety will decrease Outcome: Progressing   Problem: Elimination: Goal: Will not experience complications related to bowel motility Outcome: Progressing Goal: Will not experience complications related to urinary retention Outcome: Progressing   Problem: Pain Managment: Goal: General experience of comfort will improve Outcome: Progressing   Problem: Safety: Goal: Ability to remain free from injury will improve Outcome: Progressing   Problem: Skin Integrity: Goal: Risk for impaired skin integrity will decrease Outcome: Progressing   Problem: Education: Goal: Knowledge of disease or condition will improve Outcome: Progressing

## 2021-09-19 ENCOUNTER — Other Ambulatory Visit: Payer: Self-pay | Admitting: Radiology

## 2021-09-19 ENCOUNTER — Inpatient Hospital Stay (HOSPITAL_COMMUNITY): Payer: Medicare HMO

## 2021-09-19 DIAGNOSIS — I639 Cerebral infarction, unspecified: Secondary | ICD-10-CM

## 2021-09-19 LAB — GLUCOSE, CAPILLARY
Glucose-Capillary: 144 mg/dL — ABNORMAL HIGH (ref 70–99)
Glucose-Capillary: 177 mg/dL — ABNORMAL HIGH (ref 70–99)
Glucose-Capillary: 133 mg/dL — ABNORMAL HIGH (ref 70–99)

## 2021-09-19 MED ORDER — CLOPIDOGREL BISULFATE 75 MG PO TABS: 75.0000 mg | ORAL_TABLET | Freq: Every day | ORAL | 0 refills | Status: DC

## 2021-09-19 MED ORDER — ASPIRIN EC 81 MG PO TBEC: 81.0000 mg | DELAYED_RELEASE_TABLET | Freq: Every day | ORAL | 0 refills | Status: DC

## 2021-09-19 MED ORDER — PRAVASTATIN SODIUM 40 MG PO TABS: 40.0000 mg | ORAL_TABLET | Freq: Every day | ORAL | 0 refills | Status: DC

## 2021-09-19 NOTE — TOC Initial Note (Signed)
Transition of Care [facility]) - Initial/Assessment Note    Patient Details  Name: Robert Warner MRN: [id number] Date of Birth: 1940-11-23  Transition of Care [facility]) CM/SW Contact:    Pollie Friar, RN Phone Number: 09/19/2021, 10:26 AM  Clinical Narrative:                 Patient is from home with spouse that can provide needed supervision.  Pt denies any issues with his home medications. Wife and daughter can provide transportation. Recommendations for outpatient therapy. Pt prefers to attend at [facility]. Referral sent and information on the AVS. Pt has transport home when medically ready.  Expected Discharge Plan: OP Rehab Barriers to Discharge: Continued Medical Work up   Patient Goals and CMS Choice     Choice offered to / list presented to : Patient, Spouse  Expected Discharge Plan and Services Expected Discharge Plan: OP Rehab   Discharge Planning Services: CM Consult   Living arrangements for the past 2 months: Single Family Home                                      Prior Living Arrangements/Services Living arrangements for the past 2 months: Single Family Home Lives with:: Spouse Patient language and need for interpreter reviewed:: Yes Do you feel safe going back to the place where you live?: Yes      Need for Family Participation in Patient Care: No (Comment) Care giver support system in place?: Yes (comment)   Criminal Activity/Legal Involvement Pertinent to Current Situation/Hospitalization: No - Comment as needed  Activities of Daily Living Home Assistive Devices/Equipment: Eyeglasses, Hearing aid (bilateral hearing aides, reading glasses) ADL Screening (condition at time of admission) Patient's cognitive ability adequate to safely complete daily activities?: Yes Is the patient deaf or have difficulty hearing?: Yes (wears 2 hearing aides) Does the patient have difficulty seeing, even when wearing glasses/contacts?: No Does the patient have  difficulty concentrating, remembering, or making decisions?: Yes (normal memory problems for "80 years old") Patient able to express need for assistance with ADLs?: Yes Does the patient have difficulty dressing or bathing?: No Independently performs ADLs?: Yes (appropriate for developmental age) Does the patient have difficulty walking or climbing stairs?: Yes (today) Weakness of Legs: Both Weakness of Arms/Hands: Both  Permission Sought/Granted                  Emotional Assessment Appearance:: Appears stated age Attitude/Demeanor/Rapport: Engaged Affect (typically observed): Accepting Orientation: : Oriented to Self, Oriented to Place, Oriented to  Time, Oriented to Situation   Psych Involvement: No (comment)  Admission diagnosis:  Acute embolic stroke [facility]) [Y85.0] Cerebrovascular accident (CVA) due to embolism of precerebral artery ([place]) [I63.10] Patient Active Problem List   Diagnosis Date Noted   Acute embolic stroke ([place]) 27/74/1287   Mixed diabetic hyperlipidemia associated with type 2 diabetes mellitus ([place]) 09/16/2021   Liver masses 09/16/2021   Essential hypertension 03/12/2012   Type 2 diabetes mellitus without complication, without long-term current use of insulin ([place]) 06/15/2008   BPH (benign prostatic hyperplasia) 06/15/2008   PCP:  [name], MD Pharmacy:   [facility], [address] [place] [address] Phone: [phone] Fax: [phone]     Social Determinants of Health (SDOH) Interventions    Readmission Risk Interventions No flowsheet  data found.

## 2021-09-19 NOTE — Progress Notes (Signed)
TCD w/ bubble study completed.   Please see CV Proc for preliminary results.   Edeline Greening, RDMS, RVT  

## 2021-09-19 NOTE — Discharge Summary (Signed)
Physician Discharge Summary  Robert Warner TSV:[id number] DOB: [birth year] DOA: 09/16/2021  PCP: [name], MD  Admit date: 09/16/2021 Discharge date: 09/19/2021  Admitted From: HOME Disposition:  HOM  Recommendations for Outpatient Follow-up:  Follow up with PCP in 1-2 weeks Please FU W/ ir FOR BIOPSY OF LIVER MASS ON coming Friday.  Home Health:no  Equipment/Devices: none  Discharge Condition: Stable Code Status:   Code Status: Full Code Diet recommendation:  Diet Order             Diet heart healthy/carb modified Room service appropriate? Yes; Fluid consistency: Thin  Diet effective now                    Brief/Interim Summary: 80 year old male with history of diabetes mellitus type 2, hypertension, hyperlipidemia, BPH who is currently undergoing valuation for incidentally discovered multiple liver masses on MRI from 10/22 came to ED with acute onset of unsteady gait first appreciated when he woke up from sleep on 11/4 at 9 AM   In the ED CT head showed no acute findings.  MRI brain revealed multiple small foci of restricted diffusion throughout the bilateral cerebellar and cerebral hemispheres consistent with acute infarct.  Neurology was consulted.  Patient was transferred to [facility]  Patient was seen by neurology completed a stroke work-up-significant for multifocal embolic stroke on MRI, echo with intra-atrial shunt, transcranial Doppler with bubble study advised by neurology, lower extremity Dopplers negative LDL 97 HbA1c 6.9 patient placed on aspirin and Plavix but due to need for biopsy antiplatelets stopped until biopsy by neurology.  Continue statin.TDC was abnormal but no indication for therapy, likley chronic per neurology and okay to discharge home today.  Discharge Diagnoses:   Multifocal embolic stroke on MRI, echo with intra-atrial shunt, transcranial Doppler with bubble study advised by neurology, lower extremity Dopplers negative LDL 97 HbA1c 6.9  patient placed on aspirin and Plavix but due to need for biopsy antiplatelets stopped until biopsy by neurology.  Continue statin.TDC was abnormal but no indication for therapy, likley chronic per neurology and okay to discharge home toda  Incidental liver masses noted in October 22 being followed up for liver biopsy by IR 11/8.  Spoke with Wagner/IR NP to arrange for liver biopsy and neurology recommending holding antiplatelet until then. IR needs asa held x 3 days and plavix x 5 days before biopsy- IR will arrange the f/u Patient was scheduled to get biopsy at [facility] long 11/8. Per IR " he can go to [facility] Fri 11/11-----HOLD ASA/Plavix till 09/24/21. he must be at [facility] short stay Ctr at 11 am- needs driver back and forth- needs someone at home with him overnight; npo after MN" INSTRUCTION PROVIDED TO PATIENT  Pulmonary nodule 9 mm right upper lobe seen in the CTA will need CT chest in 3 months-again follow-up with liver biopsy.  Type 2 diabetes mellitus A1c stable blood sugar control Hypertension blood pressure stable allow permissive hypertension while here resume home meds upon discharge HLD LDL 97 continue pravastatin BPH on Flomax CKD stage IIIa-at stable baseline function.  Consults: IR NEUROLOGY  Subjective: AAOX3 Discharge Exam: Vitals:   09/19/21 0502 09/19/21 0800  BP: 133/88 (!) 115/99  Pulse: 84 91  Resp: 18 18  Temp: 98 F (36.7 C) 97.9 F (36.6 C)  SpO2: 100% 100%   General: Pt is alert, awake, not in acute distress Cardiovascular: RRR, S1/S2 +, no rubs, no gallops Respiratory: CTA bilaterally, no wheezing, no rhonchi Abdominal:  Soft, NT, ND, bowel sounds + Extremities: no edema, no cyanosis  Discharge Instructions  Discharge Instructions     Ambulatory referral to Physical Therapy   Complete by: As directed    Discharge instructions   Complete by: As directed    Follow-up with IR radiology for biopsy. Follow-up with primary care doctor to get repeat CT scan 3  months for Pulmonary nodules.  You are scheduled to have biopsy at [facility] Fri 11/11-----HOLD ASA/Plavix til 11/12.  you must be at [facility] short stay Ctr at 11 am- needs driver back and forth- needs someone at home with you overnight; do not eat anything after midnight on 09/23/21 for biopsy that day.   Please call call MD or return to ER for similar or worsening recurring problem that brought you to hospital or if any fever,nausea/vomiting,abdominal pain, uncontrolled pain, chest pain,  shortness of breath or any other alarming symptoms.  Please follow-up your doctor as instructed in a week time and call the office for appointment.  Please avoid alcohol, smoking, or any other illicit substance and maintain healthy habits including taking your regular medications as prescribed.  You were cared for by a hospitalist during your hospital stay. If you have any questions about your discharge medications or the care you received while you were in the hospital after you are discharged, you can call the unit and ask to speak with the hospitalist on call if the hospitalist that took care of you is not available.  Once you are discharged, your primary care physician will handle any further medical issues. Please note that NO REFILLS for any discharge medications will be authorized once you are discharged, as it is imperative that you return to your primary care physician (or establish a relationship with a primary care physician if you do not have one) for your aftercare needs so that they can reassess your need for medications and monitor your lab values   Increase activity slowly   Complete by: As directed       Allergies as of 09/19/2021   No Known Allergies      Medication List     TAKE these medications    aspirin EC 81 MG tablet Take 1 tablet (81 mg total) by mouth daily. Swallow whole. Start taking on: September 25, 2021   clopidogrel 75 MG tablet Commonly known as: Plavix Take 1 tablet (75 mg  total) by mouth daily. Start after conforming with IR Doctor after biopsy Start taking on: September 25, 2021   freestyle lancets Check blood sugar once daily   FREESTYLE LITE test strip Generic drug: glucose blood CHECK BLOOD SUGAR ONCE DAILY.  Dx:E11.9   metFORMIN 750 MG 24 hr tablet Commonly known as: GLUCOPHAGE-XR TAKE 1 TABLET(750 MG) BY MOUTH DAILY WITH BREAKFAST What changed: See the new instructions.   pravastatin 40 MG tablet Commonly known as: PRAVACHOL Take 1 tablet (40 mg total) by mouth daily. Start taking on: September 20, 2021 What changed:  medication strength See the new instructions.   Rybelsus 7 MG Tabs Generic drug: Semaglutide Take 7 mg by mouth daily.   silodosin 8 MG Caps capsule Commonly known as: RAPAFLO Take 8 mg by mouth at bedtime.   Travatan Z 0.004 % Soln ophthalmic solution Generic drug: Travoprost (BAK Free) Place 1 drop into both eyes at bedtime.   Vitamin D3 50 MCG (2000 UT) Tabs Take 2,000 Units by mouth daily.        Follow-up Information  [facility]. Schedule an appointment as soon as possible for a visit.   Specialty: Rehabilitation Why: If you have not received a phone call by 12 pm day after d/c please reach out and schedule an appointment Contact information: [place]. [address] [place], [address] [phone] [place]        [name], MD Follow up in 1 week(s).   Specialty: Family Medicine Contact information: [place] [address] [phone]                No Known Allergies  The results of significant diagnostics from this hospitalization (including imaging, microbiology, ancillary and laboratory) are listed below for reference.    Microbiology: Recent Results (from the past 240 hour(s))  Resp Panel by RT-PCR (Flu A&B, Covid) Nasopharyngeal Swab     Status: None   Collection Time: 09/16/21  6:00 PM    Specimen: Nasopharyngeal Swab; Nasopharyngeal(NP) swabs in vial transport medium  Result Value Ref Range Status   SARS Coronavirus 2 by RT PCR NEGATIVE NEGATIVE Final    Comment: (NOTE) SARS-CoV-2 target nucleic acids are NOT DETECTED.  The SARS-CoV-2 RNA is generally detectable in upper respiratory specimens during the acute phase of infection. The lowest concentration of SARS-CoV-2 viral copies this assay can detect is 138 copies/mL. A negative result does not preclude SARS-Cov-2 infection and should not be used as the sole basis for treatment or other patient management decisions. A negative result may occur with  improper specimen collection/handling, submission of specimen other than nasopharyngeal swab, presence of viral mutation(s) within the areas targeted by this assay, and inadequate number of viral copies(<138 copies/mL). A negative result must be combined with clinical observations, patient history, and epidemiological information. The expected result is Negative.  Fact Sheet for Patients:  EntrepreneurPulse.com.au  Fact Sheet for Healthcare Providers:  IncredibleEmployment.be  This test is no t yet approved or cleared by the [place] FDA and  has been authorized for detection and/or diagnosis of SARS-CoV-2 by FDA under an Emergency Use Authorization (EUA). This EUA will remain  in effect (meaning this test can be used) for the duration of the COVID-19 declaration under Section 564(b)(1) of the Act, 21 U.S.C.section 360bbb-3(b)(1), unless the authorization is terminated  or revoked sooner.       Influenza A by PCR NEGATIVE NEGATIVE Final   Influenza B by PCR NEGATIVE NEGATIVE Final    Comment: (NOTE) The Xpert Xpress SARS-CoV-2/FLU/RSV plus assay is intended as an aid in the diagnosis of influenza from Nasopharyngeal swab specimens and should not be used as a sole basis for treatment. Nasal washings and aspirates are  unacceptable for Xpert Xpress SARS-CoV-2/FLU/RSV testing.  Fact Sheet for Patients: EntrepreneurPulse.com.au  Fact Sheet for Healthcare Providers: IncredibleEmployment.be  This test is not yet approved or cleared by the [place] FDA and has been authorized for detection and/or diagnosis of SARS-CoV-2 by FDA under an Emergency Use Authorization (EUA). This EUA will remain in effect (meaning this test can be used) for the duration of the COVID-19 declaration under Section 564(b)(1) of the Act, 21 U.S.C. section 360bbb-3(b)(1), unless the authorization is terminated or revoked.  Performed at [facility], [place] [address], [place], [place] [address]     Procedures/Studies: CT ANGIO HEAD W OR WO CONTRAST  Result Date: 09/16/2021 CLINICAL DATA:  Dizziness EXAM: CT ANGIOGRAPHY HEAD AND NECK TECHNIQUE: Multidetector CT imaging of the head and neck was performed using the  standard protocol during bolus administration of intravenous contrast. Multiplanar CT image reconstructions and MIPs were obtained to evaluate the vascular anatomy. Carotid stenosis measurements (when applicable) are obtained utilizing NASCET criteria, using the distal internal carotid diameter as the denominator. CONTRAST:  9mL OMNIPAQUE IOHEXOL 350 MG/ML SOLN COMPARISON:  None. FINDINGS: CTA NECK FINDINGS SKELETON: There is no bony spinal canal stenosis. No lytic or blastic lesion. OTHER NECK: Normal pharynx, larynx and major salivary glands. No cervical lymphadenopathy. Unremarkable thyroid gland. UPPER CHEST: Right upper lobe nodule measures 9 mm (series 5, image 8). AORTIC ARCH: There is no calcific atherosclerosis of the aortic arch. There is no aneurysm, dissection or hemodynamically significant stenosis of the visualized portion of the aorta. Conventional 3 vessel aortic branching pattern. The visualized proximal subclavian arteries are widely patent. RIGHT CAROTID  SYSTEM: Normal without aneurysm, dissection or stenosis. LEFT CAROTID SYSTEM: Normal without aneurysm, dissection or stenosis. VERTEBRAL ARTERIES: Left dominant configuration. Both origins are clearly patent. There is no dissection, occlusion or flow-limiting stenosis to the skull base (V1-V3 segments). CTA HEAD FINDINGS POSTERIOR CIRCULATION: --Vertebral arteries: Normal V4 segments. --Inferior cerebellar arteries: Normal. --Basilar artery: Normal. --Superior cerebellar arteries: Normal. --Posterior cerebral arteries (PCA): Normal. ANTERIOR CIRCULATION: --Intracranial internal carotid arteries: Normal. --Anterior cerebral arteries (ACA): Normal. Both A1 segments are present. Patent anterior communicating artery (a-comm). --Middle cerebral arteries (MCA): Normal. VENOUS SINUSES: As permitted by contrast timing, patent. ANATOMIC VARIANTS: None Review of the MIP images confirms the above findings. IMPRESSION: 1. No emergent large vessel occlusion or high-grade stenosis of the intracranial or cervical arteries. 2. 9 mm right upper lobe pulmonary nodule. Consider one of the following in 3 months for both low-risk and high-risk individuals: (a) repeat chest CT, (b) follow-up PET-CT, or (c) tissue sampling. This recommendation follows the consensus statement: Guidelines for Management of Incidental Pulmonary Nodules Detected on CT Images: From the Fleischner Society 2017; Radiology 2017; 284:228-243. Electronically Signed   By: Ulyses Jarred M.D.   On: 09/16/2021 22:52   CT HEAD WO CONTRAST  Result Date: 09/16/2021 CLINICAL DATA:  Altered mental status. EXAM: CT HEAD WITHOUT CONTRAST TECHNIQUE: Contiguous axial images were obtained from the base of the skull through the vertex without intravenous contrast. COMPARISON:  None. FINDINGS: Brain: Mild chronic ischemic white matter disease is noted. No mass effect or midline shift is noted. Ventricular size is within normal limits. There is no evidence of mass lesion,  hemorrhage or acute infarction. Vascular: No hyperdense vessel or unexpected calcification. Skull: Normal. Negative for fracture or focal lesion. Sinuses/Orbits: No acute finding. Other: None. IMPRESSION: No acute intracranial abnormality seen. Electronically Signed   By: [name] M.D.   On: 09/16/2021 16:18   CT ANGIO NECK W OR WO CONTRAST  Result Date: 09/16/2021 CLINICAL DATA:  Dizziness EXAM: CT ANGIOGRAPHY HEAD AND NECK TECHNIQUE: Multidetector CT imaging of the head and neck was performed using the standard protocol during bolus administration of intravenous contrast. Multiplanar CT image reconstructions and MIPs were obtained to evaluate the vascular anatomy. Carotid stenosis measurements (when applicable) are obtained utilizing NASCET criteria, using the distal internal carotid diameter as the denominator. CONTRAST:  85mL OMNIPAQUE IOHEXOL 350 MG/ML SOLN COMPARISON:  None. FINDINGS: CTA NECK FINDINGS SKELETON: There is no bony spinal canal stenosis. No lytic or blastic lesion. OTHER NECK: Normal pharynx, larynx and major salivary glands. No cervical lymphadenopathy. Unremarkable thyroid gland. UPPER CHEST: Right upper lobe nodule measures 9 mm (series 5, image 8). AORTIC ARCH: There is no calcific atherosclerosis  of the aortic arch. There is no aneurysm, dissection or hemodynamically significant stenosis of the visualized portion of the aorta. Conventional 3 vessel aortic branching pattern. The visualized proximal subclavian arteries are widely patent. RIGHT CAROTID SYSTEM: Normal without aneurysm, dissection or stenosis. LEFT CAROTID SYSTEM: Normal without aneurysm, dissection or stenosis. VERTEBRAL ARTERIES: Left dominant configuration. Both origins are clearly patent. There is no dissection, occlusion or flow-limiting stenosis to the skull base (V1-V3 segments). CTA HEAD FINDINGS POSTERIOR CIRCULATION: --Vertebral arteries: Normal V4 segments. --Inferior cerebellar arteries: Normal. --Basilar  artery: Normal. --Superior cerebellar arteries: Normal. --Posterior cerebral arteries (PCA): Normal. ANTERIOR CIRCULATION: --Intracranial internal carotid arteries: Normal. --Anterior cerebral arteries (ACA): Normal. Both A1 segments are present. Patent anterior communicating artery (a-comm). --Middle cerebral arteries (MCA): Normal. VENOUS SINUSES: As permitted by contrast timing, patent. ANATOMIC VARIANTS: None Review of the MIP images confirms the above findings. IMPRESSION: 1. No emergent large vessel occlusion or high-grade stenosis of the intracranial or cervical arteries. 2. 9 mm right upper lobe pulmonary nodule. Consider one of the following in 3 months for both low-risk and high-risk individuals: (a) repeat chest CT, (b) follow-up PET-CT, or (c) tissue sampling. This recommendation follows the consensus statement: Guidelines for Management of Incidental Pulmonary Nodules Detected on CT Images: From the Fleischner Society 2017; Radiology 2017; 284:228-243. Electronically Signed   By: Ulyses Jarred M.D.   On: 09/16/2021 22:52   MR Brain Wo Contrast (neuro protocol)  Result Date: 09/16/2021 CLINICAL DATA:  Dizziness EXAM: MRI HEAD WITHOUT CONTRAST TECHNIQUE: Multiplanar, multiecho pulse sequences of the brain and surrounding structures were obtained without intravenous contrast. COMPARISON:  No prior MRI, correlation is made with 09/16/2021 CT head FINDINGS: Brain: Multiple small foci of restricted diffusion with ADC correlates in the cerebral cortex bilaterally, as well as cerebral white matter, right thalamus, and bilateral cerebellar hemispheres, likely acute infarcts, with additional increased signal on diffusion-weighted imaging without ADC correlate in the vermis and left aspect of the splenium of the corpus callosum, likely subacute infarcts. No evidence of hemorrhage, mass, mass effect, or midline shift. Confluent T2 hyperintense signal in the periventricular white matter and pons, likely the  sequela of severe chronic small vessel ischemic disease. Vascular: Normal flow voids. Skull and upper cervical spine: Normal marrow signal. Sinuses/Orbits: No acute finding. Status post bilateral lens replacements. Other: The mastoids are well aerated. IMPRESSION: Multiple small foci of restricted diffusion throughout the bilateral cerebral and cerebellar hemispheres, as well as the right thalamus, consistent with acute infarcts. Additional subacute infarcts in the vermis and left aspect of the splenium of the corpus callosum. Given different vascular territories, an embolic etiology is favored. These results were called by telephone at the time of interpretation on 09/16/2021 at 7:47 pm to provider HALEY SAGE , who verbally acknowledged these results. Electronically Signed   By: Merilyn Baba M.D.   On: 09/16/2021 19:48   MR LIVER W WO CONTRAST  Result Date: 09/06/2021 CLINICAL DATA:  Evaluate liver mass seen on recent abdominal sonogram. EXAM: MRI ABDOMEN WITHOUT AND WITH CONTRAST TECHNIQUE: Multiplanar multisequence MR imaging of the abdomen was performed both before and after the administration of intravenous contrast. CONTRAST:  2mL MULTIHANCE GADOBENATE DIMEGLUMINE 529 MG/ML IV SOLN COMPARISON:  08/05/2021 FINDINGS: Lower chest: No acute findings. Hepatobiliary: There are multifocal rim enhancing lesions identified throughout both lobes of liver which are worrisome for metastatic disease. Index lesion within lateral dome of right hepatic lobe measures 1.1 cm, image 26/16. Segment 4a lesion measures 1.6 by 1.4 cm,  image 34/16. Segment 2 lesion measures 0.8 x 0.7 cm, image 34/16. Within the posteromedial margin of right hepatic lobe subcapsular lesion measures 2.5 x 2.0 cm, image 67/16. The gallbladder is difficult to visualize being filled with stones and partially contracted. No intrahepatic bile duct dilatation. The common bile duct is upper limits of normal measuring 6 mm. Pancreas: No signs of  pancreatic inflammation. The main pancreatic duct appears ectatic with scattered areas of side branch duct ectasia. Spleen:  Within normal limits in size and appearance. Adrenals/Urinary Tract: Normal adrenal glands. Right kidney cyst measures 7.0 x 4.2 cm, image 11/7. No internal septation or mural nodule. No suspicious kidney mass or hydronephrosis identified bilaterally Stomach/Bowel: Visualized portions within the abdomen are unremarkable. Vascular/Lymphatic: Normal appearance of the abdominal aorta. No adenopathy identified. Other:  None. Musculoskeletal: No suspicious bone lesions identified. IMPRESSION: 1. There are multifocal rim enhancing lesions identified throughout both lobes of liver which are worrisome for metastatic disease. Recommend correlation with tissue sampling. 2. Right kidney cyst. 3. The gallbladder is difficult to visualize being filled with stones and partially contracted. No intrahepatic bile duct dilatation. 4. The main pancreatic duct appears ectatic with scattered areas of side branch duct ectasia. Electronically Signed   By: Kerby Moors M.D.   On: 09/06/2021 08:15   VAS [place] TRANSCRANIAL DOPPLER W BUBBLES  Result Date: 09/19/2021  Transcranial Doppler with Bubble [name]  Date of Exam:   09/19/2021 Medical Rec #: [id number]        Accession #:    [id number] Date of Birth: 23-Jan-1941        Patient Gender: M Patient Age:   21 years Exam Location:  [facility] Procedure:      VAS [place] TRANSCRANIAL DOPPLER W BUBBLES Referring Phys: Elwin Sleight DE [place] TORRE --------------------------------------------------------------------------------  Indications: Stroke. Comparison Study: 09-17-2021 Echo complete w/ bubble was positive for                   intracardiac shunting. Performing Technologist: Darlin Coco RDMS, RVT  Examination Guidelines: A complete evaluation includes B-mode imaging, spectral Doppler, color Doppler, and power Doppler as needed of all accessible  portions of each vessel. Bilateral testing is considered an integral part of a complete examination. Limited examinations for reoccurring indications may be performed as noted.  Summary:  A vascular evaluation was performed. The right middle cerebral artery was studied. An IV was inserted into the patient's left forearm. Verbal informed consent was obtained.  Between 15 and 30 high intensity transient signals (HITS) were observed at rest, and between 30 and 100 were observed with valsalva, indicating a Spencer grade 3 patent foramen ovale (PFO). *See table(s) above for TCD measurements and observations.    Preliminary    ECHOCARDIOGRAM COMPLETE BUBBLE STUDY  Result Date: 09/17/2021    ECHOCARDIOGRAM REPORT   Patient Name:   Robert Warner Date of Exam: 09/17/2021 Medical Rec #:  [id number]       Height:       70.0 in Accession #:    [id number]      Weight:       200.0 lb Date of Birth:  27-Sep-1941       BSA:          2.087 m Patient Age:    20 years        BP:           133/95 mmHg Patient Gender: M  HR:           67 bpm. Exam Location:  Inpatient Procedure: 2D Echo, Cardiac Doppler, Color Doppler and Intracardiac            Opacification Agent Indications:    Stroke 434.91 / I63.9  History:        Patient has no prior history of Echocardiogram examinations.                 Risk Factors:Hypertension, Diabetes and Dyslipidemia.  Sonographer:    Darlina Sicilian RDCS Referring Phys: [id number] [place]  1. Left ventricular ejection fraction, by estimation, is 60 to 65%. The left ventricle has normal function. The left ventricle has no regional wall motion abnormalities. There is mild left ventricular hypertrophy. Left ventricular diastolic parameters are consistent with Grade I diastolic dysfunction (impaired relaxation).  2. Right ventricular systolic function is normal. The right ventricular size is normal.  3. The mitral valve is normal in structure. No evidence of mitral valve  regurgitation. No evidence of mitral stenosis.  4. The aortic valve is tricuspid. Aortic valve regurgitation is not visualized. No aortic stenosis is present.  5. Aortic dilatation noted. There is mild dilatation of the ascending aorta, measuring 38 mm.  6. Agitated saline contrast bubble study was positive with shunting observed within 3-6 cardiac cycles suggestive of interatrial shunt. FINDINGS  Left Ventricle: Left ventricular ejection fraction, by estimation, is 60 to 65%. The left ventricle has normal function. The left ventricle has no regional wall motion abnormalities. Definity contrast agent was given IV to delineate the left ventricular  endocardial borders. The left ventricular internal cavity size was normal in size. There is mild left ventricular hypertrophy. Left ventricular diastolic parameters are consistent with Grade I diastolic dysfunction (impaired relaxation). Normal left ventricular filling pressure. Right Ventricle: The right ventricular size is normal. No increase in right ventricular wall thickness. Right ventricular systolic function is normal. Left Atrium: Left atrial size was normal in size. Right Atrium: Right atrial size was normal in size. Pericardium: There is no evidence of pericardial effusion. Mitral Valve: The mitral valve is normal in structure. No evidence of mitral valve regurgitation. No evidence of mitral valve stenosis. Tricuspid Valve: The tricuspid valve is normal in structure. Tricuspid valve regurgitation is not demonstrated. No evidence of tricuspid stenosis. Aortic Valve: The aortic valve is tricuspid. Aortic valve regurgitation is not visualized. No aortic stenosis is present. Aortic valve mean gradient measures 2.6 mmHg. Aortic valve peak gradient measures 5.5 mmHg. Aortic valve area, by VTI measures 3.14 cm. Pulmonic Valve: The pulmonic valve was not well visualized. Pulmonic valve regurgitation is not visualized. No evidence of pulmonic stenosis. Aorta: The aortic  root is normal in size and structure and aortic dilatation noted. There is mild dilatation of the ascending aorta, measuring 38 mm. IAS/Shunts: The interatrial septum was not well visualized. Agitated saline contrast was given intravenously to evaluate for intracardiac shunting. Agitated saline contrast bubble study was positive with shunting observed within 3-6 cardiac cycles suggestive of interatrial shunt.  LEFT VENTRICLE PLAX 2D LVIDd:         4.00 cm   Diastology LVIDs:         2.90 cm   LV e' medial:    4.68 cm/s LV PW:         1.00 cm   LV E/e' medial:  12.0 LV IVS:        1.20 cm   LV e' lateral:  5.77 cm/s LVOT diam:     2.40 cm   LV E/e' lateral: 9.8 LV SV:         74 LV SV Index:   35 LVOT Area:     4.52 cm  RIGHT VENTRICLE RV S prime:     12.80 cm/s LEFT ATRIUM             Index       RIGHT ATRIUM          Index LA diam:        3.10 cm 1.49 cm/m  RA Area:     7.64 cm LA Vol (A2C):   15.3 ml 7.33 ml/m  RA Volume:   10.60 ml 5.08 ml/m LA Vol (A4C):   15.2 ml 7.28 ml/m LA Biplane Vol: 16.2 ml 7.76 ml/m  AORTIC VALVE AV Area (Vmax):    3.30 cm AV Area (Vmean):   3.23 cm AV Area (VTI):     3.14 cm AV Vmax:           117.28 cm/s AV Vmean:          74.266 cm/s AV VTI:            0.235 m AV Peak Grad:      5.5 mmHg AV Mean Grad:      2.6 mmHg LVOT Vmax:         85.52 cm/s LVOT Vmean:        53.105 cm/s LVOT VTI:          0.163 m LVOT/AV VTI ratio: 0.69  AORTA Ao Root diam: 3.80 cm Ao Asc diam:  3.80 cm MITRAL VALVE MV Area (PHT): 3.23 cm    SHUNTS MV Decel Time: 235 msec    Systemic VTI:  0.16 m MV E velocity: 56.30 cm/s  Systemic Diam: 2.40 cm MV A velocity: 99.40 cm/s MV E/A ratio:  0.57 Carlyle Dolly MD Electronically signed by Carlyle Dolly MD Signature Date/Time: 09/17/2021/11:35:46 AM    Final    VAS [place] LOWER EXTREMITY VENOUS (DVT)  Result Date: 09/18/2021  Lower Venous DVT Study [name]  Date of Exam:   09/18/2021 Medical Rec #: [id number]        Accession #:     [id number] Date of Birth: [birth year]        Patient Gender: M Patient Age:   29 years Exam Location:  [facility] Procedure:      VAS [place] LOWER EXTREMITY VENOUS (DVT) Referring Phys: JEFFREY MCCLUNG --------------------------------------------------------------------------------  Indications: Stroke.  Comparison Study: No prior study on file Performing Technologist: Sharion Dove RVS  Examination Guidelines: A complete evaluation includes B-mode imaging, spectral Doppler, color Doppler, and power Doppler as needed of all accessible portions of each vessel. Bilateral testing is considered an integral part of a complete examination. Limited examinations for reoccurring indications may be performed as noted. The reflux portion of the exam is performed with the patient in reverse Trendelenburg.  +---------+---------------+---------+-----------+----------+--------------+ RIGHT    CompressibilityPhasicitySpontaneityPropertiesThrombus Aging +---------+---------------+---------+-----------+----------+--------------+ CFV      Full           Yes      Yes                                 +---------+---------------+---------+-----------+----------+--------------+ SFJ      Full                                                        +---------+---------------+---------+-----------+----------+--------------+  FV Prox  Full                                                        +---------+---------------+---------+-----------+----------+--------------+ FV Mid   Full                                                        +---------+---------------+---------+-----------+----------+--------------+ FV DistalFull                                                        +---------+---------------+---------+-----------+----------+--------------+ PFV      Full                                                        +---------+---------------+---------+-----------+----------+--------------+  POP      Full           Yes      Yes                                 +---------+---------------+---------+-----------+----------+--------------+ PTV      Full                                                        +---------+---------------+---------+-----------+----------+--------------+ PERO     Full                                                        +---------+---------------+---------+-----------+----------+--------------+   +---------+---------------+---------+-----------+----------+--------------+ LEFT     CompressibilityPhasicitySpontaneityPropertiesThrombus Aging +---------+---------------+---------+-----------+----------+--------------+ CFV      Full           Yes      Yes                                 +---------+---------------+---------+-----------+----------+--------------+ SFJ      Full                                                        +---------+---------------+---------+-----------+----------+--------------+ FV Prox  Full                                                        +---------+---------------+---------+-----------+----------+--------------+  FV Mid   Full                                                        +---------+---------------+---------+-----------+----------+--------------+ FV DistalFull                                                        +---------+---------------+---------+-----------+----------+--------------+ PFV      Full                                                        +---------+---------------+---------+-----------+----------+--------------+ POP      Full           No       Yes                                 +---------+---------------+---------+-----------+----------+--------------+ PTV      Full                                                        +---------+---------------+---------+-----------+----------+--------------+ PERO     Full                                                         +---------+---------------+---------+-----------+----------+--------------+     Summary: BILATERAL: - No evidence of deep vein thrombosis seen in the lower extremities, bilaterally. Bilateral popliteal, posterior tibial, and peroneal veins are dilated with rouleaux flow, but are easily compressible. -No evidence of popliteal cyst, bilaterally.   *See table(s) above for measurements and observations. Electronically signed by Monica Martinez MD on 09/18/2021 at 11:16:48 AM.    Final     Labs: BNP (last 3 results) No results for input(s): BNP in the last 8760 hours. Basic Metabolic Panel: Recent Labs  Lab 09/13/21 0719 09/16/21 1607 09/16/21 1617 09/18/21 0020  NA 138 138 140 136  K 4.7 5.0 4.9 3.7  CL 100 104 103 104  CO2 29 28  --  27  GLUCOSE 186* 162* 160* 130*  BUN 16 19 20 13   CREATININE 1.23 1.05 1.20 1.28*  CALCIUM 9.6 9.2  --  9.0  MG  --   --   --  1.9   Liver Function Tests: Recent Labs  Lab 09/13/21 0719 09/16/21 1607 09/18/21 0020  AST 25 28 29   ALT 15 19 18   ALKPHOS 80 69 68  BILITOT 1.2 0.7 0.9  PROT 7.1 7.0 6.3*  ALBUMIN 4.1 3.5 3.2*   No results for input(s): LIPASE, AMYLASE in the last 168 hours. Recent Labs  Lab 09/16/21 1903  AMMONIA 23   CBC: Recent  Labs  Lab 09/13/21 0719 09/16/21 1607 09/16/21 1617 09/17/21 0500 09/18/21 0020  WBC 5.6 4.2  --  4.0 4.9  NEUTROABS 2.9 2.1  --  2.5  --   HGB 14.0 13.5 14.3 14.2 13.6  HCT 42.9 41.4 42.0 42.3 41.4  MCV 85.7 86.8  --  85.6 85.0  PLT 162.0 162  --  198 177   Cardiac Enzymes: No results for input(s): CKTOTAL, CKMB, CKMBINDEX, TROPONINI in the last 168 hours. BNP: Invalid input(s): POCBNP CBG: Recent Labs  Lab 09/18/21 1239 09/18/21 1518 09/18/21 2243 09/19/21 0824 09/19/21 1121  GLUCAP 147* 206* 285* 144* 177*   D-Dimer No results for input(s): DDIMER in the last 72 hours. Hgb A1c Recent Labs    09/17/21 0500  HGBA1C 6.9*   Lipid Profile Recent  Labs    09/17/21 0500  CHOL 164  HDL 57  LDLCALC 97  TRIG 49  CHOLHDL 2.9   Thyroid function studies No results for input(s): TSH, T4TOTAL, T3FREE, THYROIDAB in the last 72 hours.  Invalid input(s): FREET3 Anemia work up No results for input(s): VITAMINB12, FOLATE, FERRITIN, TIBC, IRON, RETICCTPCT in the last 72 hours. Urinalysis    Component Value Date/Time   COLORURINE STRAW (A) 09/16/2021 2140   APPEARANCEUR CLEAR 09/16/2021 2140   LABSPEC 1.009 09/16/2021 2140   PHURINE 7.0 09/16/2021 2140   GLUCOSEU NEGATIVE 09/16/2021 2140   HGBUR SMALL (A) 09/16/2021 2140   HGBUR negative 06/15/2008 0000   BILIRUBINUR NEGATIVE 09/16/2021 2140   KETONESUR NEGATIVE 09/16/2021 2140   PROTEINUR NEGATIVE 09/16/2021 2140   UROBILINOGEN 0.2 06/15/2008 0000   NITRITE NEGATIVE 09/16/2021 2140   LEUKOCYTESUR NEGATIVE 09/16/2021 2140   Sepsis Labs Invalid input(s): PROCALCITONIN,  WBC,  LACTICIDVEN Microbiology Recent Results (from the past 240 hour(s))  Resp Panel by RT-PCR (Flu A&B, Covid) Nasopharyngeal Swab     Status: None   Collection Time: 09/16/21  6:00 PM   Specimen: Nasopharyngeal Swab; Nasopharyngeal(NP) swabs in vial transport medium  Result Value Ref Range Status   SARS Coronavirus 2 by RT PCR NEGATIVE NEGATIVE Final    Comment: (NOTE) SARS-CoV-2 target nucleic acids are NOT DETECTED.  The SARS-CoV-2 RNA is generally detectable in upper respiratory specimens during the acute phase of infection. The lowest concentration of SARS-CoV-2 viral copies this assay can detect is 138 copies/mL. A negative result does not preclude SARS-Cov-2 infection and should not be used as the sole basis for treatment or other patient management decisions. A negative result may occur with  improper specimen collection/handling, submission of specimen other than nasopharyngeal swab, presence of viral mutation(s) within the areas targeted by this assay, and inadequate number of viral copies(<138  copies/mL). A negative result must be combined with clinical observations, patient history, and epidemiological information. The expected result is Negative.  Fact Sheet for Patients:  EntrepreneurPulse.com.au  Fact Sheet for Healthcare Providers:  IncredibleEmployment.be  This test is no t yet approved or cleared by the [place] FDA and  has been authorized for detection and/or diagnosis of SARS-CoV-2 by FDA under an Emergency Use Authorization (EUA). This EUA will remain  in effect (meaning this test can be used) for the duration of the COVID-19 declaration under Section 564(b)(1) of the Act, 21 U.S.C.section 360bbb-3(b)(1), unless the authorization is terminated  or revoked sooner.       Influenza A by PCR NEGATIVE NEGATIVE Final   Influenza B by PCR NEGATIVE NEGATIVE Final    Comment: (NOTE) The Xpert Xpress SARS-CoV-2/FLU/RSV plus assay  is intended as an aid in the diagnosis of influenza from Nasopharyngeal swab specimens and should not be used as a sole basis for treatment. Nasal washings and aspirates are unacceptable for Xpert Xpress SARS-CoV-2/FLU/RSV testing.  Fact Sheet for Patients: EntrepreneurPulse.com.au  Fact Sheet for Healthcare Providers: IncredibleEmployment.be  This test is not yet approved or cleared by the [place] FDA and has been authorized for detection and/or diagnosis of SARS-CoV-2 by FDA under an Emergency Use Authorization (EUA). This EUA will remain in effect (meaning this test can be used) for the duration of the COVID-19 declaration under Section 564(b)(1) of the Act, 21 U.S.C. section 360bbb-3(b)(1), unless the authorization is terminated or revoked.  Performed at [facility], [place] [address], [place], [place] [address]      Time coordinating discharge: 35 minutes  SIGNED: Antonieta Pert, MD  [facility] 09/19/2021, 3:32 PM  If  7PM-7AM, please contact night-coverage [email or web address]

## 2021-09-19 NOTE — Progress Notes (Signed)
STROKE TEAM PROGRESS NOTE   INTERVAL HISTORY Patient was seen in room with his wife at the bedside.   His neurological exam is unchanged.  Vital signs are stable.  Patient has received aspirin and Plavix for 3 days but was supposed to undergo biopsy for his liver mass tomorrow and is concerned about rescheduling this and would like this to be done prior to being discharged. Transcranial Doppler bubble study was performed at the bedside and was positive for small to medium size right-to-left shunt.  Vitals:   09/18/21 2025 09/19/21 0143 09/19/21 0502 09/19/21 0800  BP: 108/76 135/89 133/88 (!) 115/99  Pulse: 75 77 84 91  Resp: 18 18 18 18   Temp: (!) 97.3 F (36.3 C) (!) 97.5 F (36.4 C) 98 F (36.7 C) 97.9 F (36.6 C)  TempSrc: Oral  Oral Oral  SpO2: 99% 100% 100% 100%  Weight:      Height:       CBC:  Recent Labs  Lab 09/16/21 1607 09/16/21 1617 09/17/21 0500 09/18/21 0020  WBC 4.2  --  4.0 4.9  NEUTROABS 2.1  --  2.5  --   HGB 13.5   < > 14.2 13.6  HCT 41.4   < > 42.3 41.4  MCV 86.8  --  85.6 85.0  PLT 162  --  198 177   < > = values in this interval not displayed.   Basic Metabolic Panel:  Recent Labs  Lab 09/16/21 1607 09/16/21 1617 09/18/21 0020  NA 138 140 136  K 5.0 4.9 3.7  CL 104 103 104  CO2 28  --  27  GLUCOSE 162* 160* 130*  BUN 19 20 13   CREATININE 1.05 1.20 1.28*  CALCIUM 9.2  --  9.0  MG  --   --  1.9   Lipid Panel:  Recent Labs  Lab 09/17/21 0500  CHOL 164  TRIG 49  HDL 57  CHOLHDL 2.9  VLDL 10  LDLCALC 97   HgbA1c:  Recent Labs  Lab 09/17/21 0500  HGBA1C 6.9*   Urine Drug Screen:  Recent Labs  Lab 09/16/21 2140  LABOPIA NONE DETECTED  COCAINSCRNUR NONE DETECTED  LABBENZ NONE DETECTED  AMPHETMU NONE DETECTED  THCU NONE DETECTED  LABBARB NONE DETECTED    Alcohol Level  Recent Labs  Lab 09/16/21 1607  ETH <10    IMAGING past 24 hours VAS [place] TRANSCRANIAL DOPPLER W BUBBLES  Result Date: 09/19/2021  Transcranial  Doppler with Bubble [name]  Date of Exam:   09/19/2021 Medical Rec #: [id number]        Accession #:    [id number] Date of Birth: [birth year]        Patient Gender: M Patient Age:   80 years Exam Location:  [facility] Procedure:      VAS [place] TRANSCRANIAL DOPPLER W BUBBLES Referring Phys: Elwin Sleight DE LA TORRE --------------------------------------------------------------------------------  Indications: Stroke. Comparison Study: 09-17-2021 Echo complete w/ bubble was positive for                   intracardiac shunting. Performing Technologist: Darlin Coco RDMS, RVT  Examination Guidelines: A complete evaluation includes B-mode imaging, spectral Doppler, color Doppler, and power Doppler as needed of all accessible portions of each vessel. Bilateral testing is considered an integral part of a complete examination. Limited examinations for reoccurring indications may be performed as noted.  Summary:  A vascular evaluation was performed. The right middle cerebral artery  was studied. An IV was inserted into the patient's left forearm. Verbal informed consent was obtained.  Between 15 and 30 high intensity transient signals (HITS) were observed at rest, and between 30 and 100 were observed with valsalva, indicating a Spencer grade 3 patent foramen ovale (PFO). *See table(s) above for TCD measurements and observations.    Preliminary     PHYSICAL EXAM General: Patient is an alert, mildly obese African-American male in no acute distress  NEURO:  Mental Status: AA&Ox3  Speech/Language: speech is without dysarthria or aphasia.  Naming, repetition, fluency, and comprehension intact.  Cranial Nerves:  II: PERRL. Visual fields full.  III, IV, VI: EOMI. Eyelids elevate symmetrically.  V: Sensation is intact to light touch and symmetrical to face.  VII: Smile is symmetrical. Able to puff cheeks and raise eyebrows.  VIII: hearing intact to voice. IX, X: Palate elevates symmetrically.  Phonation is normal.  FB:PZWCHENI shrug 5/5. XII: tongue is midline without fasciculations. Motor: 5/5 strength to all muscle groups tested.  Tone: is normal and bulk is normal Sensation- Intact to light touch bilaterally. Extinction absent to light touch to DSS.  Coordination: FTN intact bilaterally, HKS: no ataxia in BLE.No drift.  Gait- deferred    ASSESSMENT/PLAN Robert Warner is a 80 y.o. male with history of DM, HLD, HTN and a liver mass presenting with imbalance and difficulties with gait.   MRI reveals multiple acute infarcts in bilateral cerebral and cerebellar hemispheres presenting an embolic picture.  Echocardiogram with bubble study reveals an atrial level shunt.  Will plan to perform transcranial doppler with bubble study tomorrow and then consult cardiology to determine course of action.  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 fib  CT head No acute abnormality.  CTA head & neck No LVO or high grade stenosis, incidental 73mm right pulmonary nodule MRI  Multiple small acute infarcts in bilateral cerebral and cerebellar hemispheres and right thalamus, subacute infarcts in vermis and left splenium of corpus callosum 2D Echo EF 60-65%. Grade 1 diastolic dysfunction, interatrial shunt Venous ultrasound of BLE- no evidence for DVT LDL 97 HgbA1c 6.9 VTE prophylaxis - lovenox    Diet   Diet heart healthy/carb modified Room service appropriate? Yes; Fluid consistency: Thin   No antithrombotic prior to admission, now on aspirin 81 mg daily and clopidogrel 75 mg daily.  Therapy recommendations:  outpatient PT, no OT follow up Disposition:  home  Hypertension Home meds:  none Stable Permissive hypertension (OK if < 220/120) but gradually normalize in 5-7 days Long-term BP goal  normotensive  Hyperlipidemia Home meds:  pravastatin 20 mg daily, increase to 40 mg daily LDL 97, goal < 70 High intensity statin continues Continue statin at discharge  Diabetes type II Controlled Home meds:  metformin 750 mg daily, semaglutide 7 mg daily HgbA1c 6.9, goal < 7.0 CBGs Recent Labs    09/18/21 2243 09/19/21 0824 09/19/21 1121  GLUCAP 285* 144* 177*    SSI  Likely PFO Echocardiogram with bubble study reveals atrial level shunt Will perform TCD with bubble study tomorrow Contact cardiology after bubble study to discuss options  Other Stroke Risk Factors Advanced Age >/= 24  Former cigarette smoker Family hx stroke (father, grandfather and sister)  Other Active Problems Liver mass Patient is scheduled for outpatient biopsy of liver mass at [facility] on Tuesday If patient will not be discharged  in time for this appointment, procedure may be able to be performed while inpatient  Incidental pulmonary nodule Patient aware of finding, primary team to advise on follow-up  Hospital day # 3  Patient has bilateral embolic infarcts of indeterminate etiology and with a recent diagnosis of suspected hepatic cancer recommend holding antiplatelet therapy despite his acute stroke in order to get the liver biopsy done as soon as possible and then start antiplatelet therapy after that.  Discussed with interventional radiology and they will schedule liver biopsy on Friday hence hold antiplatelet therapy today and resume it after the biopsy.  Long discussion with patient and wife regarding risk-benefit of holding antiplatelet therapy versus delaying liver biopsy and diagnosis of liver cancer and potential treatment options.  They agree with the plan.  Maintain aggressive risk factor modification.  Patient also has a small to medium sized PFO which is likely an incidental finding and not the cause of stroke. ROPE score of 2 gives him 0% chance that the stroke is due to PFO Long  discussion with patient, wife, Dr. Maren Beach and interventional radiology team.  Greater than 50% time during this 35-minute visit was spent in counseling and coordination of care and discussion with care team.  Antony Contras MD To contact Stroke Continuity provider, please refer to [email or web address] After hours, contact General Neurology

## 2021-09-19 NOTE — Plan of Care (Signed)
  Problem: Education: Goal: Knowledge of General Education information will improve Description: Including pain rating scale, medication(s)/side effects and non-pharmacologic comfort measures Outcome: Completed/Met   Problem: Health Behavior/Discharge Planning: Goal: Ability to manage health-related needs will improve Outcome: Completed/Met   Problem: Clinical Measurements: Goal: Ability to maintain clinical measurements within normal limits will improve Outcome: Completed/Met   Problem: Activity: Goal: Risk for activity intolerance will decrease Outcome: Completed/Met   Problem: Nutrition: Goal: Adequate nutrition will be maintained Outcome: Completed/Met

## 2021-09-19 NOTE — Care Management Important Message (Signed)
Important Message  Patient Details  Name: Robert Warner MRN: [id number] Date of Birth: 07/28/1941   Medicare Important Message Given:  Yes     Aizah Gehlhausen [address] 09/19/2021, 3:33 PM

## 2021-09-19 NOTE — Progress Notes (Signed)
Physical Therapy Treatment Patient Details Name: Robert Warner MRN: [id number] DOB: 1941-01-07 Today's Date: 09/19/2021   History of Present Illness Pt is 80 yo male presents to [facility] hospital on 09/16/2021 with instability. MRI demonstrates multiple infarcts in cerebral and cerebellar territories most likely embolic CVA. Also found to have incidental liver masses. PMHx: arhtritis, HTN, DM, HLD.    PT Comments    Pt making excellent progress.  Session focused on high level balance activities.  Pt scoring 23/24 on the DGI and did well with all activities -particularly considering 80 yo.  He felt most challenged with balance disk activities and balance recover strategies.  States still feels a little off compared to baseline and goals not met yet.  Continue plan of care but did decrease frequency to 3xweek due to good progress and high level   Recommendations for follow up therapy are one component of a multi-disciplinary discharge planning process, led by the attending physician.  Recommendations may be updated based on patient status, additional functional criteria and insurance authorization.  Follow Up Recommendations  Outpatient PT     Assistance Recommended at Discharge PRN  Equipment Recommendations  None recommended by PT    Recommendations for Other Services       Precautions / Restrictions Precautions Precautions: Fall     Mobility  Bed Mobility Overal bed mobility: Independent                  Transfers Overall transfer level: Independent                      Ambulation/Gait Ambulation/Gait assistance: Supervision Gait Distance (Feet): 600 Feet Assistive device: None Gait Pattern/deviations: WFL(Within Functional Limits)       General Gait Details: Normal gait - focused on balance (see below)   Stairs Stairs: Yes Stairs assistance: Supervision Stair Management: Alternating pattern;No rails Number of Stairs: 5     Wheelchair Mobility     Modified Rankin (Stroke Patients Only) Modified Rankin (Stroke Patients Only) Pre-Morbid Rankin Score: No symptoms Modified Rankin: No significant disability     Balance Overall balance assessment: Modified Independent   Sitting balance-Leahy Scale: Normal       Standing balance-Leahy Scale: Good                 High Level Balance Comments: [place] EO and EC 30 secs (stopped then); Tandem eyes open 30 sec each leg; SLS eyes open 8 sec on R 5 sec on L; pick item from floor no difficulty; stepping over and around objects without difficulty; marching without difficulty; Standing on balance disk (holding rail to get up but then no hands) - 1 min x 2 with weight shifting R/L/Front/back, min guard and occasional hands in high guard (pt enjoyed the challenge); Balance recovery strategies -pt leaned into therapist and therapist removed support, no warning, varied time, 3x all direction -pt recovering forward/R/L with ankle strategy and posterior with hip strategy. Standardized Balance Assessment Standardized Balance Assessment : Dynamic Gait Index   Dynamic Gait Index Level Surface: Normal Change in Gait Speed: Normal Gait with Horizontal Head Turns: Normal Gait with Vertical Head Turns: Normal Gait and Pivot Turn: Normal Step Over Obstacle: Normal Step Around Obstacles: Normal Steps: Normal Total Score: 24      Cognition Arousal/Alertness: Awake/alert Behavior During Therapy: WFL for tasks assessed/performed Overall Cognitive Status: Within Functional Limits for tasks assessed  Exercises      General Comments        Pertinent Vitals/Pain Pain Assessment: No/denies pain    Home Living                          Prior Function            PT Goals (current goals can now be found in the care plan section) Progress towards PT goals: Progressing toward goals    Frequency    Min  3X/week      PT Plan Frequency needs to be updated (decreased to 3xweek due to high level/outpt level)    Co-evaluation              AM-PAC PT "6 Clicks" Mobility   Outcome Measure  Help needed turning from your back to your side while in a flat bed without using bedrails?: None Help needed moving from lying on your back to sitting on the side of a flat bed without using bedrails?: None Help needed moving to and from a bed to a chair (including a wheelchair)?: None Help needed standing up from a chair using your arms (e.g., wheelchair or bedside chair)?: None Help needed to walk in hospital room?: None Help needed climbing 3-5 steps with a railing? : A Little 6 Click Score: 23    End of Session Equipment Utilized During Treatment: Gait belt Activity Tolerance: Patient tolerated treatment well Patient left: in bed;with call bell/phone within reach;with family/visitor present (no alarm - safe to mobilize independently on unit) Nurse Communication: Mobility status PT Visit Diagnosis: Unsteadiness on feet (R26.81)     Time: 0626-9485 PT Time Calculation (min) (ACUTE ONLY): 17 min  Charges:  $Neuromuscular Re-education: 8-22 mins                     Abran Richard, PT Acute Rehab Services Pager [phone] [facility] [phone]    Karlton Lemon 09/19/2021, 11:31 AM

## 2021-09-19 NOTE — Evaluation (Signed)
Speech Language Pathology Evaluation Patient Details Name: Robert Warner MRN: [id number] DOB: [birth year] Today's Date: 09/19/2021 Time: 3295-1884 SLP Time Calculation (min) (ACUTE ONLY): 35 min  Problem List:  Patient Active Problem List   Diagnosis Date Noted   Acute embolic stroke ([place]) 16/60/6301   Mixed diabetic hyperlipidemia associated with type 2 diabetes mellitus ([place]) 09/16/2021   Liver masses 09/16/2021   Essential hypertension 03/12/2012   Type 2 diabetes mellitus without complication, without long-term current use of insulin ([place]) 06/15/2008   BPH (benign prostatic hyperplasia) 06/15/2008   Past Medical History:  Past Medical History:  Diagnosis Date   Arthritis    Diabetes mellitus    Hyperlipidemia    Hypertension    Past Surgical History:  Past Surgical History:  Procedure Laterality Date   APPENDECTOMY  1968   CATARACT EXTRACTION Bilateral 07/2000   states 2 weeks ago (first of sept) OD due in Dec    COLONOSCOPY  2004,2009   Negative, Dr. Sharlett Iles    KIDNEY STONE SURGERY  10/2018   PROSTATE BIOPSY  2006   Dr.Sigmund Tannebaum   HPI:  80yo male admitted 09/16/21 with balance issues. PMH: DM2, HTN, HLD, prostatic hyperplasia, multiple liver masses.  MRI demonstrates multiple infarcts in cerebral and cerebellar territories most likely embolic CVA.   Assessment / Plan / Recommendation Clinical Impression  Pt seen for evaluation of cognitive linguistic function. The [place] Mental Status (SLUMS) Examination was administered. Pt scored 26/30, raising concern for mild neurocognitive deficits. Pt had difficulty with delayed recall, remembering 2/5 words independently. Difficulty noted on 4-digit reversal task as well. Pt reports awareness of memory deficits, and indicates strategies in place to maximize functional recall (writing information/appointments down). No further ST intervention is recommended at this time. If needs arise in the future, please  do not hesitate to reconsult.    SLP Assessment  SLP Recommendation/Assessment: Patient does not need any further Speech Language Pathology Services    Recommendations for follow up therapy are one component of a multi-disciplinary discharge planning process, led by the attending physician.  Recommendations may be updated based on patient status, additional functional criteria and insurance authorization.    Follow Up Recommendations  None       SLP Evaluation Cognition  Overall Cognitive Status: Within Functional Limits for tasks assessed Arousal/Alertness: Awake/alert Orientation Level: Oriented X4       Comprehension  Auditory Comprehension Overall Auditory Comprehension: Appears within functional limits for tasks assessed    Expression Expression Primary Mode of Expression: Verbal Verbal Expression Overall Verbal Expression: Appears within functional limits for tasks assessed Written Expression Dominant Hand: Right   Oral / Motor  Oral Motor/Sensory Function Overall Oral Motor/Sensory Function: Within functional limits Motor Speech Overall Motor Speech: Appears within functional limits for tasks assessed   GO                   Maverik Foot B. Quentin Ore, [facility], [place] Speech Language Pathologist Office: [phone]  Shonna Chock 09/19/2021, 2:05 PM

## 2021-09-19 NOTE — Progress Notes (Addendum)
   This pt was scheduled for liver lesion biopsy as OP at [facility] for 09/20/21. Now inpt at [facility]--- CVA.  Has had 3-4 doses of ASA 325 and Plavix 75 mg.  I have spoken to Dr Serafina Royals and Dr York Pellant agree- must now come off ASA Plavix per SIR protocol.  Bx can be rescheduled as OP at later date Must be off Plavix 5 days and ASA 325 mg 3 days before biopsy to be safe.  MD aware. I messaged MD with phone # of OP IR schedulers  ADDENDUM:  Per Dr Leonie Man and Dr Lupita Leash---- since pt will be off ASA and Plavix 5 days by 11/11 (LD was 11/6) We have rescheduled pt as OP 11/11 Fri at [facility] Radiology Pt to be at [place] at 1100 am NPO after MN Must have a driver back and forth Must have someone at home overnight  Can restart ASA/Plavix 11/12

## 2021-09-20 ENCOUNTER — Observation Stay (HOSPITAL_COMMUNITY)
Admission: EM | Admit: 2021-09-20 | Discharge: 2021-09-22 | Disposition: A | Discharge status: Home / Self Care | Payer: Medicare HMO | Attending: Emergency Medicine | Admitting: Emergency Medicine

## 2021-09-20 ENCOUNTER — Other Ambulatory Visit: Payer: Self-pay

## 2021-09-20 ENCOUNTER — Ambulatory Visit (HOSPITAL_COMMUNITY): Payer: Medicare HMO

## 2021-09-20 ENCOUNTER — Telehealth: Payer: Self-pay | Admitting: Family Medicine

## 2021-09-20 ENCOUNTER — Encounter (HOSPITAL_COMMUNITY): Payer: Self-pay | Admitting: Emergency Medicine

## 2021-09-20 ENCOUNTER — Telehealth: Payer: Self-pay

## 2021-09-20 ENCOUNTER — Emergency Department (HOSPITAL_COMMUNITY): Payer: Medicare HMO

## 2021-09-20 DIAGNOSIS — I10 Essential (primary) hypertension: Secondary | ICD-10-CM | POA: Diagnosis present

## 2021-09-20 DIAGNOSIS — Z20822 Contact with and (suspected) exposure to covid-19: Secondary | ICD-10-CM | POA: Insufficient documentation

## 2021-09-20 DIAGNOSIS — I129 Hypertensive chronic kidney disease with stage 1 through stage 4 chronic kidney disease, or unspecified chronic kidney disease: Secondary | ICD-10-CM | POA: Insufficient documentation

## 2021-09-20 DIAGNOSIS — R0789 Other chest pain: Secondary | ICD-10-CM | POA: Diagnosis not present

## 2021-09-20 DIAGNOSIS — Z7982 Long term (current) use of aspirin: Secondary | ICD-10-CM | POA: Diagnosis not present

## 2021-09-20 DIAGNOSIS — E1122 Type 2 diabetes mellitus with diabetic chronic kidney disease: Secondary | ICD-10-CM | POA: Diagnosis not present

## 2021-09-20 DIAGNOSIS — Z87891 Personal history of nicotine dependence: Secondary | ICD-10-CM | POA: Diagnosis not present

## 2021-09-20 DIAGNOSIS — Z794 Long term (current) use of insulin: Secondary | ICD-10-CM | POA: Insufficient documentation

## 2021-09-20 DIAGNOSIS — E119 Type 2 diabetes mellitus without complications: Secondary | ICD-10-CM | POA: Diagnosis not present

## 2021-09-20 DIAGNOSIS — Q248 Other specified congenital malformations of heart: Secondary | ICD-10-CM

## 2021-09-20 DIAGNOSIS — Z79899 Other long term (current) drug therapy: Secondary | ICD-10-CM | POA: Diagnosis not present

## 2021-09-20 DIAGNOSIS — N183 Chronic kidney disease, stage 3 unspecified: Secondary | ICD-10-CM

## 2021-09-20 DIAGNOSIS — N1831 Chronic kidney disease, stage 3a: Secondary | ICD-10-CM | POA: Diagnosis not present

## 2021-09-20 DIAGNOSIS — Z8673 Personal history of transient ischemic attack (TIA), and cerebral infarction without residual deficits: Secondary | ICD-10-CM | POA: Diagnosis not present

## 2021-09-20 DIAGNOSIS — R079 Chest pain, unspecified: Secondary | ICD-10-CM | POA: Diagnosis not present

## 2021-09-20 DIAGNOSIS — R072 Precordial pain: Secondary | ICD-10-CM | POA: Diagnosis not present

## 2021-09-20 DIAGNOSIS — E1165 Type 2 diabetes mellitus with hyperglycemia: Secondary | ICD-10-CM

## 2021-09-20 DIAGNOSIS — R160 Hepatomegaly, not elsewhere classified: Secondary | ICD-10-CM | POA: Diagnosis present

## 2021-09-20 DIAGNOSIS — Z7984 Long term (current) use of oral hypoglycemic drugs: Secondary | ICD-10-CM | POA: Diagnosis not present

## 2021-09-20 HISTORY — DX: Cerebral infarction, unspecified: I63.9

## 2021-09-20 LAB — BASIC METABOLIC PANEL
Anion gap: 13 (ref 5–15)
BUN: 21 mg/dL (ref 8–23)
CO2: 24 mmol/L (ref 22–32)
Calcium: 8.9 mg/dL (ref 8.9–10.3)
Chloride: 97 mmol/L — ABNORMAL LOW (ref 98–111)
Creatinine, Ser: 1.32 mg/dL — ABNORMAL HIGH (ref 0.61–1.24)
GFR, Estimated: 54 mL/min — ABNORMAL LOW (ref >60)
Glucose, Bld: 239 mg/dL — ABNORMAL HIGH (ref 70–99)
Potassium: 4.2 mmol/L (ref 3.5–5.1)
Sodium: 134 mmol/L — ABNORMAL LOW (ref 135–145)

## 2021-09-20 LAB — CBC
HCT: 41.8 % (ref 39.0–52.0)
Hemoglobin: 13.7 g/dL (ref 13.0–17.0)
MCH: 28 pg (ref 26.0–34.0)
MCHC: 32.8 g/dL (ref 30.0–36.0)
MCV: 85.3 fL (ref 80.0–100.0)
Platelets: 187 10*3/uL (ref 150–400)
RBC: 4.9 MIL/uL (ref 4.22–5.81)
RDW: 12.9 % (ref 11.5–15.5)
WBC: 5.9 10*3/uL (ref 4.0–10.5)
nRBC: 0 % (ref 0.0–0.2)

## 2021-09-20 LAB — TROPONIN I (HIGH SENSITIVITY)
Troponin I (High Sensitivity): 36 ng/L — ABNORMAL HIGH (ref <18)
Troponin I (High Sensitivity): 40 ng/L — ABNORMAL HIGH (ref <18)

## 2021-09-20 IMAGING — CR DG CHEST 2V
2 series · 2 of 2 positions shown · non-contrast
Comparison: [DATE]

CLINICAL DATA: Chest pain

EXAM:
CHEST - 2 VIEW

[chest pa]
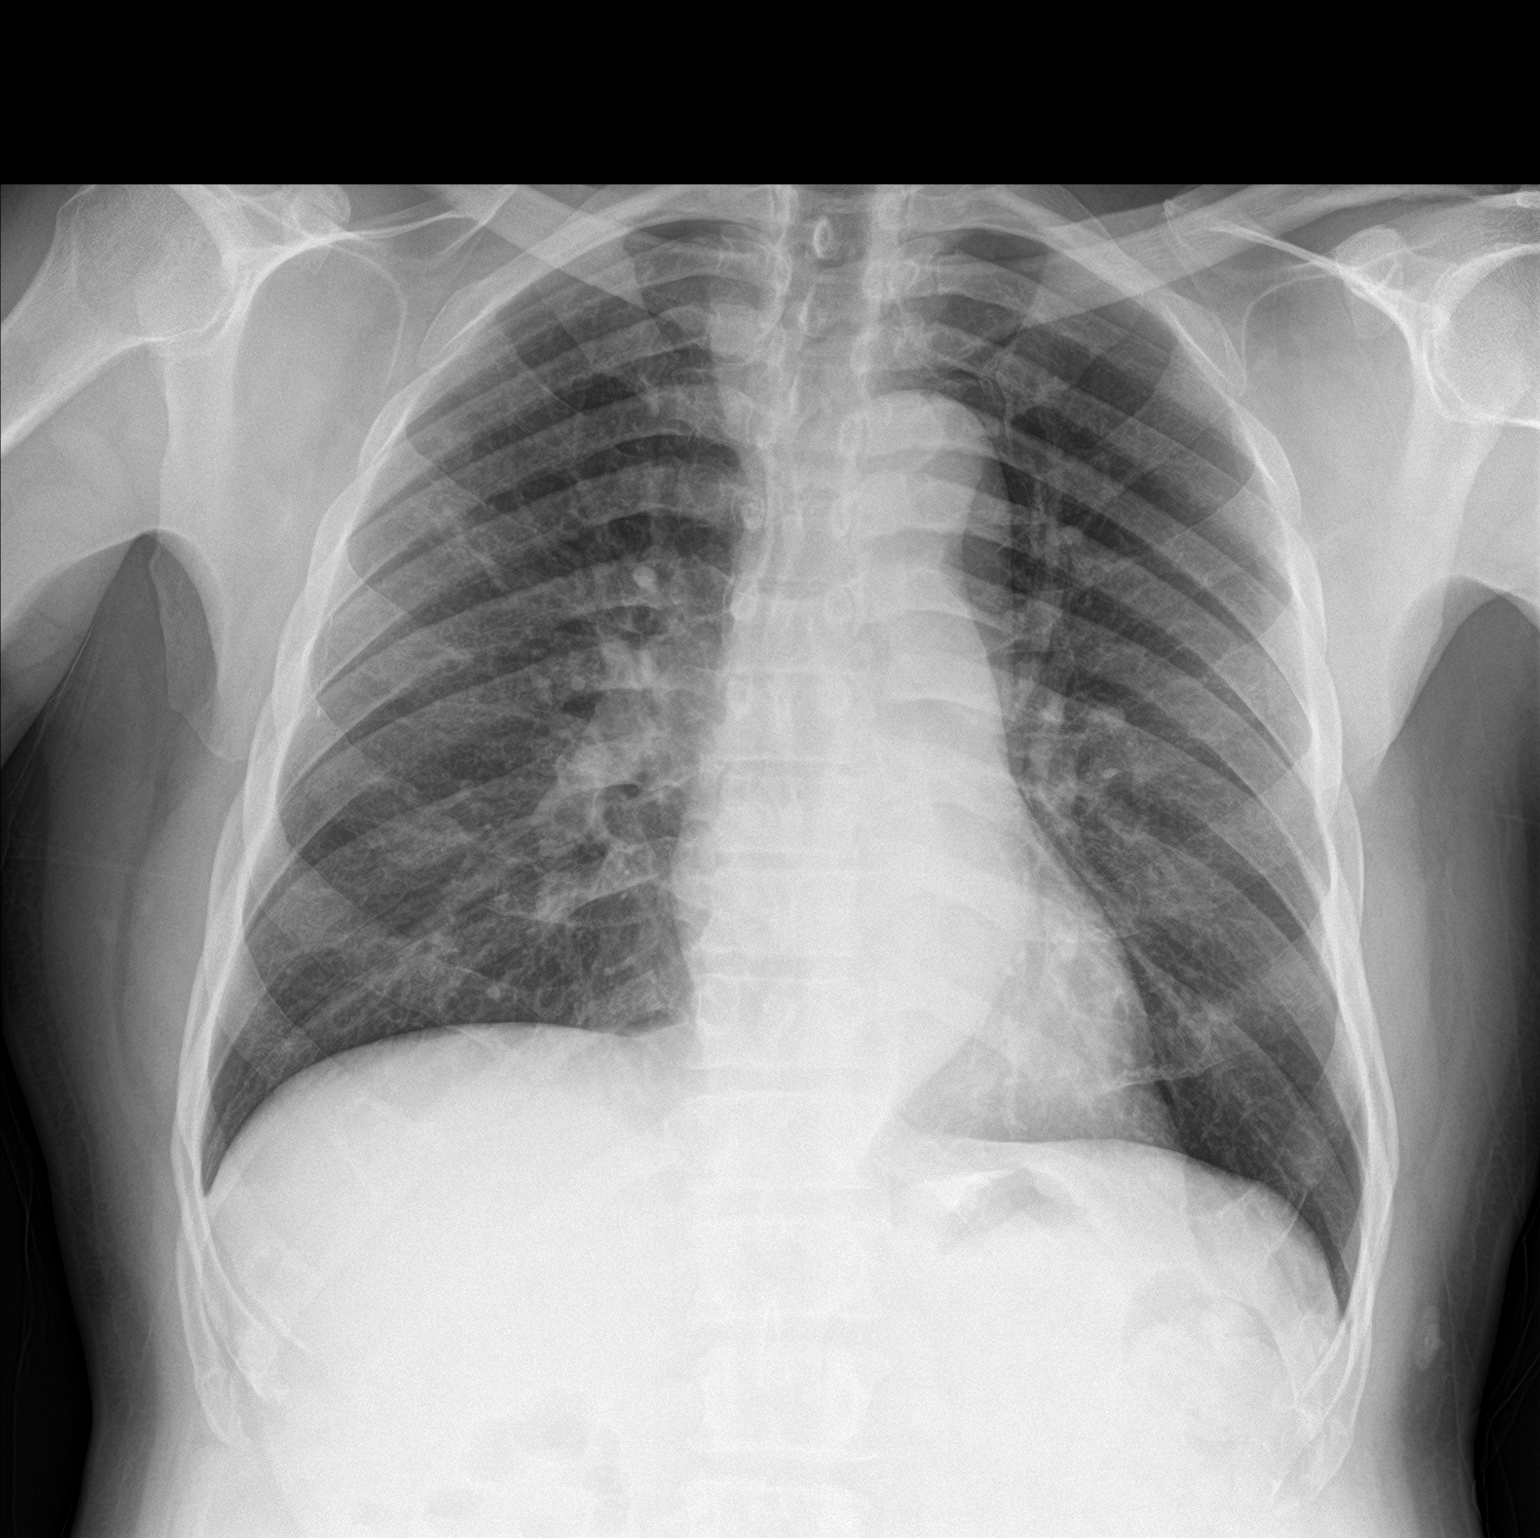

[chest lat]
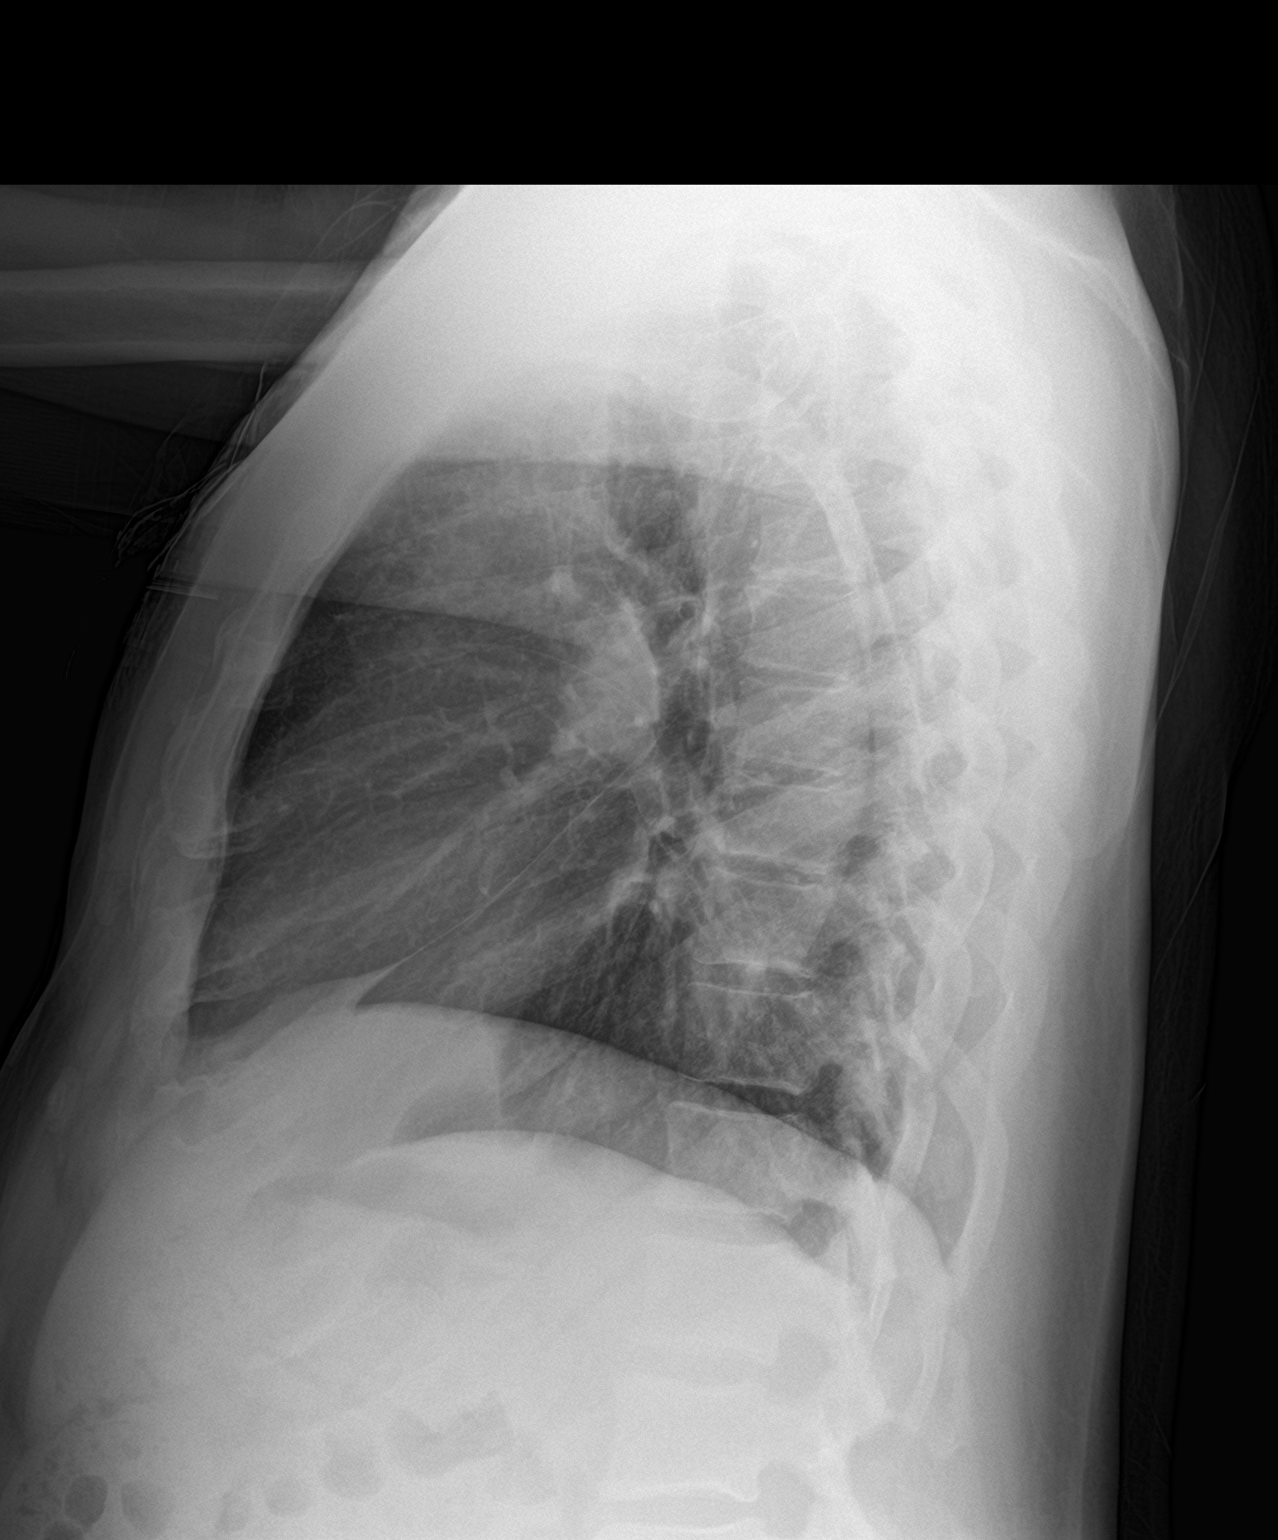

[2 of 2 positions shown; findings below may reference images not displayed]

FINDINGS: The heart size and mediastinal contours are within normal limits.
Both lungs are clear. The visualized skeletal structures are
unremarkable.
IMPRESSION: No active cardiopulmonary disease.

## 2021-09-20 MED ORDER — ALUM & MAG HYDROXIDE-SIMETH 200-200-20 MG/5ML PO SUSP
30.0000 mL | Freq: Once | ORAL | Status: AC
  Administered 2021-09-20: 30 mL via ORAL
  Filled 2021-09-20: qty 30

## 2021-09-20 NOTE — ED Provider Notes (Signed)
[place] EMERGENCY DEPARTMENT Provider Note   CSN: [id number] Arrival date & time: 09/20/21  2025     History Chief Complaint  Patient presents with   Chest Pain    Robert Warner is a 80 y.o. male.  81 yo M with chief complaints of chest discomfort.  This occurred after he had dinner.  Pain complaints area on the left side of his chest felt sharp.  Felt like indigestion.  Was just in the hospital with a stroke.  Was found to have possible lesions on his liver and is scheduled for a biopsy on Friday.  Stop taking his dual antiplatelet therapy yesterday.  His symptoms have improved significantly.  He denies any difficulty breathing denies diaphoresis nausea or vomiting.  Denies exertional symptoms.  Denies history of MI prior to his stroke he denied being on medications for hypertension hyperlipidemia.  Denies family history.  Patient has a history of diabetes is a non-smoker.  Denies history of PE or DVT  The history is provided by the patient and the spouse.  Chest Pain Pain location:  L chest Pain quality: sharp   Pain radiates to:  Does not radiate Pain severity:  Moderate Onset quality:  Gradual Duration:  2 hours Timing:  Constant Progression:  Partially resolved Chronicity:  New Relieved by:  Nothing Worsened by:  Nothing Ineffective treatments:  None tried Associated symptoms: shortness of breath   Associated symptoms: no abdominal pain, no fever, no headache, no palpitations and no vomiting       Past Medical History:  Diagnosis Date   Arthritis    Diabetes mellitus    Hyperlipidemia    Hypertension    Stroke [facility])     Patient Active Problem List   Diagnosis Date Noted   Chest pain 09/21/2021   Recent cerebrovascular accident (CVA) 09/21/2021   Interatrial cardiac shunt 09/21/2021   CKD (chronic kidney disease) stage 3, GFR 30-59 ml/min ([place]) 23/53/6144   Acute embolic stroke ([place]) 31/54/0086   Mixed diabetic hyperlipidemia  associated with type 2 diabetes mellitus ([place]) 09/16/2021   Liver masses 09/16/2021   Essential hypertension 03/12/2012   Type 2 diabetes mellitus without complication, without long-term current use of insulin ([place]) 06/15/2008   BPH (benign prostatic hyperplasia) 06/15/2008    Past Surgical History:  Procedure Laterality Date   APPENDECTOMY  1968   CATARACT EXTRACTION Bilateral 07/2000   states 2 weeks ago (first of sept) OD due in Dec    COLONOSCOPY  2004,2009   Negative, Dr. Sharlett Iles    KIDNEY STONE SURGERY  10/2018   PROSTATE BIOPSY  2006   Dr.Sigmund Tannebaum       Family History  Problem Relation Age of Onset   Stroke Father 25   Hypertension Mother    Coronary artery disease Mother    Diabetes Maternal Aunt    Pancreatic cancer Brother    Stroke Paternal Grandmother    Breast cancer Paternal Aunt    Coronary artery disease Paternal Aunt    Coronary artery disease Maternal Grandmother    Coronary artery disease Maternal Uncle        2 Maternal Uncles    Diabetes Maternal Uncle    Stroke Sister    Heart disease Neg Hx     Social History   Tobacco Use   Smoking status: Former    Packs/day: 0.50    Years: 10.00    Pack years: 5.00    Types: Cigarettes  Quit date: 11/13/1966    Years since quitting: 54.8   Smokeless tobacco: Never  Vaping Use   Vaping Use: Never used  Substance Use Topics   Alcohol use: Yes    Alcohol/week: 2.0 standard drinks    Types: 2 Glasses of wine per week    Comment: Occasional wine or beer   Drug use: No    Home Medications Prior to Admission medications   Medication Sig Start Date End Date Taking? Authorizing Provider  aspirin EC 81 MG tablet Take 1 tablet (81 mg total) by mouth daily. Swallow whole. 09/25/21 10/25/21  Antonieta Pert, MD  Cholecalciferol (VITAMIN D3) 2000 UNITS TABS Take 2,000 Units by mouth daily.    [provider]  clopidogrel (PLAVIX) 75 MG tablet Take 1 tablet (75 mg total) by mouth daily. Start  after conforming with IR Doctor after biopsy 09/25/21 10/25/21  Antonieta Pert, MD  glucose blood (FREESTYLE LITE) test strip CHECK BLOOD SUGAR ONCE DAILY.  Dx:E11.9 08/04/20   Burchette, Alinda Sierras, MD  Lancets (FREESTYLE) lancets Check blood sugar once daily 03/15/12   Hendricks Limes, MD  metFORMIN (GLUCOPHAGE-XR) 750 MG 24 hr tablet TAKE 1 TABLET(750 MG) BY MOUTH DAILY WITH BREAKFAST Patient taking differently: Take 750 mg by mouth daily with breakfast. 08/22/21   Burchette, Alinda Sierras, MD  pravastatin (PRAVACHOL) 40 MG tablet Take 1 tablet (40 mg total) by mouth daily. 09/20/21 10/20/21  Antonieta Pert, MD  Semaglutide (RYBELSUS) 7 MG TABS Take 7 mg by mouth daily. Patient not taking: No sig reported 05/23/21   [name], MD  silodosin (RAPAFLO) 8 MG CAPS capsule Take 8 mg by mouth at bedtime.    [provider]  TRAVATAN Z 0.004 % SOLN ophthalmic solution Place 1 drop into both eyes at bedtime. 11/12/16   [provider]    Allergies    Patient has no known allergies.  Review of Systems   Review of Systems  Constitutional:  Negative for chills and fever.  HENT:  Negative for congestion and facial swelling.   Eyes:  Negative for discharge and visual disturbance.  Respiratory:  Positive for shortness of breath.   Cardiovascular:  Positive for chest pain. Negative for palpitations.  Gastrointestinal:  Negative for abdominal pain, diarrhea and vomiting.  Musculoskeletal:  Negative for arthralgias and myalgias.  Skin:  Negative for color change and rash.  Neurological:  Negative for tremors, syncope and headaches.  Psychiatric/Behavioral:  Negative for confusion and dysphoric mood.    Physical Exam Updated Vital Signs BP (!) 145/93 (BP Location: Right Arm)   Pulse 72   Temp 98 F (36.7 C) (Oral)   Resp 18   Ht 5\' 10"  (1.778 m)   Wt 88.3 kg   SpO2 100%   BMI 27.92 kg/m   Physical Exam Vitals and nursing note reviewed.  Constitutional:      Appearance: He is  well-developed.  HENT:     Head: Normocephalic and atraumatic.  Eyes:     Pupils: Pupils are equal, round, and reactive to light.  Neck:     Vascular: No JVD.  Cardiovascular:     Rate and Rhythm: Normal rate and regular rhythm.     Heart sounds: No murmur heard.   No friction rub. No gallop.  Pulmonary:     Effort: No respiratory distress.     Breath sounds: No wheezing.  Chest:     Chest wall: No tenderness.  Abdominal:     General: There  is no distension.     Tenderness: There is no abdominal tenderness. There is no guarding or rebound.  Musculoskeletal:        General: Normal range of motion.     Cervical back: Normal range of motion and neck supple.  Skin:    Coloration: Skin is not pale.     Findings: No rash.  Neurological:     Mental Status: He is alert and oriented to person, place, and time.  Psychiatric:        Behavior: Behavior normal.    ED Results / Procedures / Treatments   Labs (all labs ordered are listed, but only abnormal results are displayed) Labs Reviewed  BASIC METABOLIC PANEL - Abnormal; Notable for the following components:      Result Value   Sodium 134 (*)    Chloride 97 (*)    Glucose, Bld 239 (*)    Creatinine, Ser 1.32 (*)    GFR, Estimated 54 (*)    All other components within normal limits  BASIC METABOLIC PANEL - Abnormal; Notable for the following components:   Glucose, Bld 140 (*)    All other components within normal limits  TROPONIN I (HIGH SENSITIVITY) - Abnormal; Notable for the following components:   Troponin I (High Sensitivity) 36 (*)    All other components within normal limits  TROPONIN I (HIGH SENSITIVITY) - Abnormal; Notable for the following components:   Troponin I (High Sensitivity) 40 (*)    All other components within normal limits  TROPONIN I (HIGH SENSITIVITY) - Abnormal; Notable for the following components:   Troponin I (High Sensitivity) 34 (*)    All other components within normal limits  TROPONIN I  (HIGH SENSITIVITY) - Abnormal; Notable for the following components:   Troponin I (High Sensitivity) 35 (*)    All other components within normal limits  RESP PANEL BY RT-PCR (FLU A&B, COVID) ARPGX2  CBC  CBC    EKG EKG Interpretation  Date/Time:  Tuesday September 20 2021 20:37:27 EST Ventricular Rate:  91 PR Interval:  154 QRS Duration: 86 QT Interval:  358 QTC Calculation: 440 R Axis:   -63 Text Interpretation: Normal sinus rhythm Pulmonary disease pattern Left anterior fascicular block Abnormal ECG No significant change since last tracing Confirmed by Deno Etienne [phone]) on 09/20/2021 9:50:35 PM  Radiology DG Chest 2 View  Result Date: 09/20/2021 CLINICAL DATA:  Chest pain EXAM: CHEST - 2 VIEW COMPARISON:  10/27/2008 FINDINGS: The heart size and mediastinal contours are within normal limits. Both lungs are clear. The visualized skeletal structures are unremarkable. IMPRESSION: No active cardiopulmonary disease. Electronically Signed   By: Donavan Foil M.D.   On: 09/20/2021 21:19    Procedures Procedures   Medications Ordered in ED Medications  pravastatin (PRAVACHOL) tablet 40 mg (40 mg Oral Given 09/21/21 1022)  tamsulosin (FLOMAX) capsule 0.4 mg (0.4 mg Oral Given 09/21/21 1023)  latanoprost (XALATAN) 0.005 % ophthalmic solution 1 drop (has no administration in time range)  sodium chloride flush (NS) 0.9 % injection 3 mL (3 mLs Intravenous Given 09/21/21 1023)  sodium chloride flush (NS) 0.9 % injection 3 mL (has no administration in time range)  0.9 %  sodium chloride infusion (has no administration in time range)  acetaminophen (TYLENOL) tablet 650 mg (has no administration in time range)    Or  acetaminophen (TYLENOL) suppository 650 mg (has no administration in time range)  nitroGLYCERIN (NITROSTAT) SL tablet 0.4 mg (has no administration in time range)  influenza vaccine adjuvanted (FLUAD) injection 0.5 mL (has no administration in time range)  hydrocortisone cream 1 %  (has no administration in time range)  alum & mag hydroxide-simeth (MAALOX/MYLANTA) 200-200-20 MG/5ML suspension 30 mL (30 mLs Oral Given 09/20/21 2228)    ED Course  I have reviewed the triage vital signs and the nursing notes.  Pertinent labs & imaging results that were available during my care of the patient were reviewed by me and considered in my medical decision making (see chart for details).    MDM Rules/Calculators/A&P                           80 yo M with a chief complaints of pinpoint left-sided chest discomfort.  This occurred just after eating dinner.  Symptoms have improved significantly.  Atypical in nature and symptoms completely resolved now without intervention.  Chest x-ray viewed by me without focal infiltrate.  No significant anemia.  Will obtain a delta troponin.  EKG without concerning finding.  First trop elevated at 36, with atypical symptoms will await second trop.  Trop rising to 40, will discuss with medicine for admission.   The patients results and plan were reviewed and discussed.   Any x-rays performed were independently reviewed by myself.   Differential diagnosis were considered with the presenting HPI.  Medications  pravastatin (PRAVACHOL) tablet 40 mg (40 mg Oral Given 09/21/21 1022)  tamsulosin (FLOMAX) capsule 0.4 mg (0.4 mg Oral Given 09/21/21 1023)  latanoprost (XALATAN) 0.005 % ophthalmic solution 1 drop (has no administration in time range)  sodium chloride flush (NS) 0.9 % injection 3 mL (3 mLs Intravenous Given 09/21/21 1023)  sodium chloride flush (NS) 0.9 % injection 3 mL (has no administration in time range)  0.9 %  sodium chloride infusion (has no administration in time range)  acetaminophen (TYLENOL) tablet 650 mg (has no administration in time range)    Or  acetaminophen (TYLENOL) suppository 650 mg (has no administration in time range)  nitroGLYCERIN (NITROSTAT) SL tablet 0.4 mg (has no administration in time range)  influenza vaccine  adjuvanted (FLUAD) injection 0.5 mL (has no administration in time range)  hydrocortisone cream 1 % (has no administration in time range)  alum & mag hydroxide-simeth (MAALOX/MYLANTA) 200-200-20 MG/5ML suspension 30 mL (30 mLs Oral Given 09/20/21 2228)    Vitals:   09/21/21 0330 09/21/21 0356 09/21/21 0416 09/21/21 1144  BP: (!) 135/91  (!) 139/97 (!) 145/93  Pulse: 73  76 72  Resp: 12  18 18   Temp:  98.1 F (36.7 C) 97.8 F (36.6 C) 98 F (36.7 C)  TempSrc:  Oral Oral Oral  SpO2: 96%  100%   Weight:   88.3 kg   Height:   5\' 10"  (1.778 m)     Final diagnoses:  Chest pain, rule out acute myocardial infarction     Final Clinical Impression(s) / ED Diagnoses Final diagnoses:  Chest pain, rule out acute myocardial infarction    Rx / DC Orders ED Discharge Orders          Ordered    Increase activity slowly        Pending    Discharge instructions       Comments: Follow-up with your outpatient lung mass work-up.  Follow-up with PCP cardiology neurology.  Please call call MD or return to ER for similar or worsening recurring problem that brought you to hospital or if any fever,nausea/vomiting,abdominal pain, uncontrolled  pain, chest pain,  shortness of breath or any other alarming symptoms.  Please follow-up your doctor as instructed in a week time and call the office for appointment.  Please avoid alcohol, smoking, or any other illicit substance and maintain healthy habits including taking your regular medications as prescribed.  You were cared for by a hospitalist during your hospital stay. If you have any questions about your discharge medications or the care you received while you were in the hospital after you are discharged, you can call the unit and ask to speak with the hospitalist on call if the hospitalist that took care of you is not available.  Once you are discharged, your primary care physician will handle any further medical issues. Please note that NO REFILLS  for any discharge medications will be authorized once you are discharged, as it is imperative that you return to your primary care physician (or establish a relationship with a primary care physician if you do not have one) for your aftercare needs so that they can reassess your need for medications and monitor your lab values   Pending    Diet - low sodium heart healthy        Pending             Deno Etienne, DO 09/21/21 1457

## 2021-09-20 NOTE — Telephone Encounter (Signed)
Patient's wife called because they are having some confusion with patient's medication and would like to discuss it. Patient was discharged from hospital last night and is scheduled for Monday to be seen for hospital follow up.   Good callback number is  [phone]      Please advise

## 2021-09-20 NOTE — Telephone Encounter (Signed)
Spoke with the patients wife. She stated that the hospital shows he should be taking the Rybelsus but was told by Dr. Elease Hashimoto to stop this medication and just take metformin. They would like to know which medication he should take for now and then can discuss further at his appointment.

## 2021-09-20 NOTE — ED Provider Notes (Signed)
Emergency Medicine Provider Triage Evaluation Note  Robert Warner , a 80 y.o. male  was evaluated in triage.  Pt complains of chest pain.  He states that he was home this evening when he had gradual onset of sharp left sided chest pain that is nonradiating.  He states that this started after dinner and he initially thought it was indigestion but it did not abate.  He denies any associated shortness of breath, nausea, diaphoresis.  He denies any lightheadedness or dizziness.  He was apparently just discharged after being hospitalized for stroke.  Review of Systems  Positive: Chest pain Negative: Lightheadedness or dizziness  Physical Exam  BP (!) 138/93   Pulse 77   Temp 98.2 F (36.8 C) (Oral)   Resp 16   SpO2 100%  Gen:   Awake, no distress   Resp:  Normal effort, clear to auscultation bilaterally MSK:   Moves extremities without difficulty  Other:  S1/S2 without murmur, pulses 2+ bilaterally  Medical Decision Making  Medically screening exam initiated at 8:43 PM.  Appropriate orders placed.  Robert Warner was informed that the remainder of the evaluation will be completed by another provider, this initial triage assessment does not replace that evaluation, and the importance of remaining in the ED until their evaluation is complete.     Mickie Hillier, PA-C 09/20/21 2045    Godfrey Pick, MD 09/21/21 [phone]

## 2021-09-20 NOTE — Telephone Encounter (Signed)
ATC, unable to leave a message.  

## 2021-09-20 NOTE — Telephone Encounter (Signed)
Per spouse  Transition Care Management Follow-up Telephone Call Date of discharge and from where: 09/19/2021 Robert Warner How have you been since you were released from the hospital? Doing really well Any questions or concerns? Yes, was concerned that is only on metformin for for blood sugar  Items Reviewed: Did the pt receive and understand the discharge instructions provided? Yes  Medications obtained and verified? Yes  Other? No  Any new allergies since your discharge? No  Dietary orders reviewed? Yes Do you have support at home? Yes   Home Care and Equipment/Supplies: Were home health services ordered? not applicable If so, what is the name of the agency? N/a  Has the agency set up a time to come to the patient's home? not applicable Were any new equipment or medical supplies ordered?  No What is the name of the medical supply agency? N/a Were you able to get the supplies/equipment? not applicable Do you have any questions related to the use of the equipment or supplies? No  Functional Questionnaire: (I = Independent and D = Dependent) ADLs: I  Bathing/Dressing- I  Meal Prep- I  Eating- I  Maintaining continence- I  Transferring/Ambulation- I  Managing Meds- I  Follow up appointments reviewed:  PCP Hospital f/u appt confirmed? Yes  Scheduled to see Dr. Elease Hashimoto on 09/26/2021 @ 11:15. Are transportation arrangements needed? No  If their condition worsens, is the pt aware to call PCP or go to the Emergency Dept.? Yes Was the patient provided with contact information for the PCP's office or ED? Yes Was to pt encouraged to call back with questions or concerns? Yes

## 2021-09-20 NOTE — ED Triage Notes (Signed)
Pt arrives for cp that started approx 30 min ago, pt reports mid sternal cp that radiates to L chest and into neck. Denies shob, N/V, dizziness, and lightheadedness. Pt was just hospitalized for strokes, released yesterday.

## 2021-09-21 ENCOUNTER — Telehealth: Payer: Self-pay

## 2021-09-21 ENCOUNTER — Encounter (HOSPITAL_COMMUNITY): Payer: Self-pay | Admitting: Family Medicine

## 2021-09-21 DIAGNOSIS — E785 Hyperlipidemia, unspecified: Secondary | ICD-10-CM | POA: Diagnosis not present

## 2021-09-21 DIAGNOSIS — Z8673 Personal history of transient ischemic attack (TIA), and cerebral infarction without residual deficits: Secondary | ICD-10-CM

## 2021-09-21 DIAGNOSIS — I10 Essential (primary) hypertension: Secondary | ICD-10-CM | POA: Diagnosis not present

## 2021-09-21 DIAGNOSIS — R072 Precordial pain: Secondary | ICD-10-CM

## 2021-09-21 DIAGNOSIS — Q248 Other specified congenital malformations of heart: Secondary | ICD-10-CM

## 2021-09-21 DIAGNOSIS — R079 Chest pain, unspecified: Secondary | ICD-10-CM | POA: Diagnosis not present

## 2021-09-21 DIAGNOSIS — N183 Chronic kidney disease, stage 3 unspecified: Secondary | ICD-10-CM

## 2021-09-21 LAB — BASIC METABOLIC PANEL
Anion gap: 8 (ref 5–15)
BUN: 18 mg/dL (ref 8–23)
CO2: 26 mmol/L (ref 22–32)
Calcium: 9 mg/dL (ref 8.9–10.3)
Chloride: 101 mmol/L (ref 98–111)
Creatinine, Ser: 1.17 mg/dL (ref 0.61–1.24)
GFR, Estimated: >60 mL/min (ref >60)
Glucose, Bld: 140 mg/dL — ABNORMAL HIGH (ref 70–99)
Potassium: 4.3 mmol/L (ref 3.5–5.1)
Sodium: 135 mmol/L (ref 135–145)

## 2021-09-21 LAB — CBC
HCT: 40.3 % (ref 39.0–52.0)
Hemoglobin: 13.1 g/dL (ref 13.0–17.0)
MCH: 27.9 pg (ref 26.0–34.0)
MCHC: 32.5 g/dL (ref 30.0–36.0)
MCV: 85.7 fL (ref 80.0–100.0)
Platelets: 195 10*3/uL (ref 150–400)
RBC: 4.7 MIL/uL (ref 4.22–5.81)
RDW: 12.9 % (ref 11.5–15.5)
WBC: 5.1 10*3/uL (ref 4.0–10.5)
nRBC: 0 % (ref 0.0–0.2)

## 2021-09-21 LAB — TROPONIN I (HIGH SENSITIVITY)
Troponin I (High Sensitivity): 34 ng/L — ABNORMAL HIGH (ref <18)
Troponin I (High Sensitivity): 35 ng/L — ABNORMAL HIGH (ref <18)

## 2021-09-21 LAB — RESP PANEL BY RT-PCR (FLU A&B, COVID) ARPGX2
Influenza A by PCR: NEGATIVE
Influenza B by PCR: NEGATIVE
SARS Coronavirus 2 by RT PCR: NEGATIVE

## 2021-09-21 MED ORDER — PRAVASTATIN SODIUM 40 MG PO TABS
40.0000 mg | ORAL_TABLET | Freq: Every day | ORAL | Status: DC
  Administered 2021-09-21: 40 mg via ORAL
  Filled 2021-09-21: qty 1

## 2021-09-21 MED ORDER — LATANOPROST 0.005 % OP SOLN
1.0000 [drp] | Freq: Every day | OPHTHALMIC | Status: DC
  Administered 2021-09-21: 1 [drp] via OPHTHALMIC
  Filled 2021-09-21: qty 2.5

## 2021-09-21 MED ORDER — TAMSULOSIN HCL 0.4 MG PO CAPS
0.4000 mg | ORAL_CAPSULE | Freq: Every day | ORAL | Status: DC
  Administered 2021-09-21 – 2021-09-22 (×2): 0.4 mg via ORAL
  Filled 2021-09-21 (×2): qty 1

## 2021-09-21 MED ORDER — INFLUENZA VAC A&B SA ADJ QUAD 0.5 ML IM PRSY
0.5000 mL | PREFILLED_SYRINGE | INTRAMUSCULAR | Status: AC
  Filled 2021-09-21: qty 0.5

## 2021-09-21 MED ORDER — ACETAMINOPHEN 650 MG RE SUPP: 650.0000 mg | Freq: Four times a day (QID) | RECTAL | Status: DC | PRN

## 2021-09-21 MED ORDER — ACETAMINOPHEN 325 MG PO TABS: 650.0000 mg | ORAL_TABLET | Freq: Four times a day (QID) | ORAL | Status: DC | PRN

## 2021-09-21 MED ORDER — SODIUM CHLORIDE 0.9 % IV SOLN: 250.0000 mL | INTRAVENOUS | Status: DC | PRN

## 2021-09-21 MED ORDER — METOPROLOL TARTRATE 100 MG PO TABS
100.0000 mg | ORAL_TABLET | Freq: Once | ORAL | Status: AC
  Administered 2021-09-22: 100 mg via ORAL
  Filled 2021-09-21: qty 1

## 2021-09-21 MED ORDER — METFORMIN HCL ER 750 MG PO TB24
750.0000 mg | ORAL_TABLET | Freq: Every day | ORAL | Status: DC
  Filled 2021-09-21: qty 1

## 2021-09-21 MED ORDER — ATORVASTATIN CALCIUM 40 MG PO TABS
40.0000 mg | ORAL_TABLET | Freq: Every day | ORAL | Status: DC
Start: 2021-09-21 — End: 2021-09-22
  Administered 2021-09-22: 40 mg via ORAL
  Filled 2021-09-21: qty 1

## 2021-09-21 MED ORDER — SODIUM CHLORIDE 0.9% FLUSH: 3.0000 mL | INTRAVENOUS | Status: DC | PRN

## 2021-09-21 MED ORDER — HYDROCORTISONE 1 % EX CREA
TOPICAL_CREAM | Freq: Three times a day (TID) | CUTANEOUS | Status: DC | PRN
  Filled 2021-09-21 (×2): qty 28

## 2021-09-21 MED ORDER — NITROGLYCERIN 0.4 MG SL SUBL: 0.4000 mg | SUBLINGUAL_TABLET | SUBLINGUAL | Status: DC | PRN

## 2021-09-21 MED ORDER — SODIUM CHLORIDE 0.9% FLUSH
3.0000 mL | Freq: Two times a day (BID) | INTRAVENOUS | Status: DC
  Administered 2021-09-21 (×2): 3 mL via INTRAVENOUS

## 2021-09-21 NOTE — H&P (Signed)
History and Physical    [name]:[id number] DOB: 1941/07/10 DOA: 09/20/2021  PCP: [name], MD   Patient coming from: Home  Chief Complaint: Chest pain  HPI: Robert Warner is a 80 y.o. male with medical history significant for DMT2, recent CVA, HTN, HLD just discharged from the hospital after a recent CVA.  With the stroke he was found to have a liver mass and is scheduled for biopsy on September 23, 2021 and is therefore off of aspirin and Plavix.  This was the plan according to the discharge summary and neurology when he was discharged.  Reports after he arrived home he ate dinner last night and then began to feel some chest pressure and tightness that he describes as a sharp pain underneath his sternum associated with some shortness of breath and indigestion.  Did not have any nausea vomiting or diaphoresis.  Symptoms have improved and he no longer has the chest pressure.  States he has never had heart problems in the past.  He has no history of PE or DVT.  Fever, chills, syncope.  He has no known sick contacts. Lives with his wife.  Denies tobacco alcohol or illicit drug use.  ED Course: In the emergency room patient been hemodynamically stable.  Initial troponins  were mildly elevated at 36-40. No ischemic changes on EKG.  CBC unremarkable.  Sodium 134 potassium 4.2 chloride 97 bicarb 24 creatinine 1.32 BUN 21 glucose 239.  Cardiology was consulted by the ER physician and hospitalist service was asked to evaluate and manage patient overnight for further work-up  Review of Systems:  General: Denies fever, chills, weight loss, night sweats.  Denies dizziness.  Denies change in appetite HENT: Denies head trauma, headache, denies change in hearing, tinnitus.  Denies nasal congestion or bleeding.  Denies sore throat.  Denies difficulty swallowing Eyes: Denies blurry vision, pain in eye, drainage.  Denies discoloration of eyes. Neck: Denies pain.  Denies swelling.  Denies pain  with movement. Cardiovascular: Reports chest pain. Denies palpitations.  Denies edema.  Denies orthopnea Respiratory: Reports shortness of breath.  Denies wheezing.  Denies sputum production Gastrointestinal: Denies abdominal pain, swelling.  Denies nausea, vomiting, diarrhea.  Denies melena.  Denies hematemesis. Musculoskeletal: Denies limitation of movement.  Denies deformity or swelling. Denies arthralgias or myalgias. Genitourinary: Denies pelvic pain.  Denies urinary frequency or hesitancy.  Denies dysuria.  Skin: Denies rash.  Denies petechiae, purpura, ecchymosis. Neurological: Denies syncope.  Denies seizure activity. Denies slurred speech, drooping face.  Denies visual change. Psychiatric: Denies depression, anxiety.  Denies hallucinations.  Past Medical History:  Diagnosis Date   Arthritis    Diabetes mellitus    Hyperlipidemia    Hypertension    Stroke [facility])     Past Surgical History:  Procedure Laterality Date   APPENDECTOMY  1968   CATARACT EXTRACTION Bilateral 07/2000   states 2 weeks ago (first of sept) OD due in Dec    COLONOSCOPY  2004,2009   Negative, Dr. Sharlett Iles    KIDNEY STONE SURGERY  10/2018   PROSTATE BIOPSY  2006   Dr.Sigmund Tannebaum    Social History  reports that he quit smoking about 54 years ago. His smoking use included cigarettes. He has a 5.00 pack-year smoking history. He has never used smokeless tobacco. He reports current alcohol use of about 2.0 standard drinks per week. He reports that he does not use drugs.  No Known Allergies  Family History  Problem Relation Age of Onset  Stroke Father 48   Hypertension Mother    Coronary artery disease Mother    Diabetes Maternal Aunt    Pancreatic cancer Brother    Stroke Paternal Grandmother    Breast cancer Paternal Aunt    Coronary artery disease Paternal Aunt    Coronary artery disease Maternal Grandmother    Coronary artery disease Maternal Uncle        2 Maternal Uncles    Diabetes  Maternal Uncle    Stroke Sister    Heart disease Neg Hx      Prior to Admission medications   Medication Sig Start Date End Date Taking? Authorizing Provider  aspirin EC 81 MG tablet Take 1 tablet (81 mg total) by mouth daily. Swallow whole. 09/25/21 10/25/21  Antonieta Pert, MD  Cholecalciferol (VITAMIN D3) 2000 UNITS TABS Take 2,000 Units by mouth daily.    [provider]  clopidogrel (PLAVIX) 75 MG tablet Take 1 tablet (75 mg total) by mouth daily. Start after conforming with IR Doctor after biopsy 09/25/21 10/25/21  Antonieta Pert, MD  glucose blood (FREESTYLE LITE) test strip CHECK BLOOD SUGAR ONCE DAILY.  Dx:E11.9 08/04/20   Burchette, Alinda Sierras, MD  Lancets (FREESTYLE) lancets Check blood sugar once daily 03/15/12   Hendricks Limes, MD  metFORMIN (GLUCOPHAGE-XR) 750 MG 24 hr tablet TAKE 1 TABLET(750 MG) BY MOUTH DAILY WITH BREAKFAST Patient taking differently: Take 750 mg by mouth daily with breakfast. 08/22/21   Burchette, Alinda Sierras, MD  pravastatin (PRAVACHOL) 40 MG tablet Take 1 tablet (40 mg total) by mouth daily. 09/20/21 10/20/21  Antonieta Pert, MD  Semaglutide (RYBELSUS) 7 MG TABS Take 7 mg by mouth daily. Patient not taking: No sig reported 05/23/21   [name], MD  silodosin (RAPAFLO) 8 MG CAPS capsule Take 8 mg by mouth at bedtime.    [provider]  TRAVATAN Z 0.004 % SOLN ophthalmic solution Place 1 drop into both eyes at bedtime. 11/12/16   [provider]    Physical Exam: Vitals:   09/20/21 2200 09/20/21 2230 09/20/21 2300 09/21/21 0015  BP: 128/87 (!) 124/91 (!) 135/91 135/84  Pulse: 72 76 75 66  Resp: 14 18 17 18   Temp:      TempSrc:      SpO2: 99% 100% 96% 98%    Constitutional: NAD, calm, comfortable Vitals:   09/20/21 2200 09/20/21 2230 09/20/21 2300 09/21/21 0015  BP: 128/87 (!) 124/91 (!) 135/91 135/84  Pulse: 72 76 75 66  Resp: 14 18 17 18   Temp:      TempSrc:      SpO2: 99% 100% 96% 98%   General: WDWN, Alert and oriented  x3.  Eyes: EOMI, PERRL, conjunctivae normal.  Sclera nonicteric HENT:  [place]/AT, external ears normal.  Nares patent without epistasis.  Mucous membranes are moist. Posterior pharynx clear  Neck: Soft, normal range of motion, supple, no masses, no thyromegaly.  Trachea midline Respiratory: clear to auscultation bilaterally, no wheezing, no crackles. Normal respiratory effort. No accessory muscle use.  Cardiovascular: Regular rate and rhythm, no murmurs / rubs / gallops. No extremity edema. 2+ pedal pulses.  Abdomen: Soft, no tenderness, nondistended, no rebound or guarding.  No masses palpated. Bowel sounds normoactive Musculoskeletal: FROM. no clubbing / cyanosis. No joint deformity upper and lower extremities. Normal muscle tone.  Skin: Warm, dry, intact no rashes, lesions, ulcers. No induration Neurologic: CN 2-12 grossly intact.  Normal speech. Strength 5/5 in all extremities.   Psychiatric: Normal  judgment and insight.  Normal mood.    Labs on Admission: I have personally reviewed following labs and imaging studies  CBC: Recent Labs  Lab 09/16/21 1607 09/16/21 1617 09/17/21 0500 09/18/21 0020 09/20/21 2050  WBC 4.2  --  4.0 4.9 5.9  NEUTROABS 2.1  --  2.5  --   --   HGB 13.5 14.3 14.2 13.6 13.7  HCT 41.4 42.0 42.3 41.4 41.8  MCV 86.8  --  85.6 85.0 85.3  PLT 162  --  198 177 096    Basic Metabolic Panel: Recent Labs  Lab 09/16/21 1607 09/16/21 1617 09/18/21 0020 09/20/21 2050  NA 138 140 136 134*  K 5.0 4.9 3.7 4.2  CL 104 103 104 97*  CO2 28  --  27 24  GLUCOSE 162* 160* 130* 239*  BUN 19 20 13 21   CREATININE 1.05 1.20 1.28* 1.32*  CALCIUM 9.2  --  9.0 8.9  MG  --   --  1.9  --     GFR: Estimated Creatinine Clearance: 48.9 mL/min (A) (by C-G formula based on SCr of 1.32 mg/dL (H)).  Liver Function Tests: Recent Labs  Lab 09/16/21 1607 09/18/21 0020  AST 28 29  ALT 19 18  ALKPHOS 69 68  BILITOT 0.7 0.9  PROT 7.0 6.3*  ALBUMIN 3.5 3.2*    Urine  analysis:    Component Value Date/Time   COLORURINE STRAW (A) 09/16/2021 2140   APPEARANCEUR CLEAR 09/16/2021 2140   LABSPEC 1.009 09/16/2021 2140   PHURINE 7.0 09/16/2021 2140   GLUCOSEU NEGATIVE 09/16/2021 2140   HGBUR SMALL (A) 09/16/2021 2140   HGBUR negative 06/15/2008 0000   BILIRUBINUR NEGATIVE 09/16/2021 2140   KETONESUR NEGATIVE 09/16/2021 2140   PROTEINUR NEGATIVE 09/16/2021 2140   UROBILINOGEN 0.2 06/15/2008 0000   NITRITE NEGATIVE 09/16/2021 2140   LEUKOCYTESUR NEGATIVE 09/16/2021 2140    Radiological Exams on Admission: DG Chest 2 View  Result Date: 09/20/2021 CLINICAL DATA:  Chest pain EXAM: CHEST - 2 VIEW COMPARISON:  10/27/2008 FINDINGS: The heart size and mediastinal contours are within normal limits. Both lungs are clear. The visualized skeletal structures are unremarkable. IMPRESSION: No active cardiopulmonary disease. Electronically Signed   By: Donavan Foil M.D.   On: 09/20/2021 21:19   VAS [place] TRANSCRANIAL DOPPLER W BUBBLES  Result Date: 09/20/2021  Transcranial Doppler with Bubble [name]  Date of Exam:   09/19/2021 Medical Rec #: [id number]        Accession #:    [id number] Date of Birth: 04/27/1941        Patient Gender: M Patient Age:   40 years Exam Location:  [facility] Procedure:      VAS [place] TRANSCRANIAL DOPPLER W BUBBLES Referring Phys: Elwin Sleight [place] --------------------------------------------------------------------------------  Indications: Stroke. Comparison Study: 09-17-2021 Echo complete w/ bubble was positive for                   intracardiac shunting. Performing Technologist: Darlin Coco RDMS, RVT  Examination Guidelines: A complete evaluation includes B-mode imaging, spectral Doppler, color Doppler, and power Doppler as needed of all accessible portions of each vessel. Bilateral testing is considered an integral part of a complete examination. Limited examinations for reoccurring indications may be performed as  noted.  Summary:  A vascular evaluation was performed. The right middle cerebral artery was studied. An IV was inserted into the patient's left forearm. Verbal informed consent was obtained.  Between 15 and  30 high intensity transient signals (HITS) were observed at rest, and between 30 and 100 were observed with valsalva, indicating a Spencer grade 3 patent foramen ovale (PFO). Positive TCD Bubble study indicative of a medium size right to left shunt *See table(s) above for TCD measurements and observations.  Diagnosing physician: Antony Contras MD Electronically signed by Antony Contras MD on 09/20/2021 at 5:55:43 PM.    Final     EKG: Independently reviewed.  EKG shows normal sinus rhythm with left anterior fascicular block.  No acute ST elevation or depression.  QTc 440  Assessment/Plan Principal Problem:   Chest pain Patient replaced on cardiac telemetry for observation for chest pain.  Obtain serial troponin levels.  If troponins remain negative we will proceed with stress test.  If troponins become positive will consult cardiology.  Antiplatelet therapy with aspirin daily.  Check lipid panel.  Continue statin therapy.  Monitor blood pressure.  Nitroglycerin as needed.  Supplemental oxygen as needed to maintain O2 sat between 92-96%  Active Problems:   Essential hypertension Hx of HTN but not on medications at this time.     Type 2 diabetes mellitus without complication, without long-term current use of insulin Continue Metformin. Recheck blood glucose with labs in am. Had HgbA1c recently which was 6.9 so will not repeat    Recent cerebrovascular accident (CVA) Pt with recent CVA and evalauted by neurology. DAPT held in anticipation of biopsy this Friday for liver lesion.  Has intracardiac shunt on echo.     Interatrial cardiac shunt Follow up with cardiology for determination if repair procedure indicated.     CKD (chronic kidney disease) stage 3, GFR 30-59 ml/min Stable.    Liver  masses Biopsy planned for 09/24/11  DVT prophylaxis: SCDs for DVT prophylaxis. Is not on DAPT or anticoagulation in anticipation of liver biopsy on 09/23/21.   Code Status:   Full Code  Family Communication:  Diagnosis and plan discussed with patient and his wife is at bedside.  They verbalized understanding agree with plan.  Further recommendations to follow as clinical indicated Disposition Plan:   Patient is from:  Home  Anticipated DC to:  Home  Anticipated DC date:  Anticipate less than 2 midnight stay  Consults called:  Cardiology was consulted by ER physician  Admission status:  Observation   Yevonne Aline Dannika Hilgeman MD [facility]  How to contact the [facility] Attending or Consulting provider [place] or covering provider during after hours [place], for this patient?   Check the care team in [facility] and look for a) attending/consulting [facility] provider listed and b) the [facility] team listed Log into [email or web address] and use [place]'s universal password to access. If you do not have the password, please contact the hospital operator. Locate the [facility] provider you are looking for under [facility] and page to a number that you can be directly reached. If you still have difficulty reaching the provider, please page the [facility] (Director on Call) for the Hospitalists listed on amion for assistance.  09/21/2021, 1:27 AM

## 2021-09-21 NOTE — Consult Note (Signed)
Cardiology Consultation:   Patient ID: Robert Warner; [id number]; 1941/03/17   Admit date: 09/20/2021 Date of Consult: 09/21/2021  Primary Care Provider: [name], MD Primary Cardiologist: None  Primary Electrophysiologist:  None   Patient Profile:   Robert Warner is a 80 y.o. male with a hx of CVA (recent), diabetes, hypertension, CKD  IIIa and dyslipidemia.  Who is being seen today for the evaluation of chest pain at the request of Antonieta Pert, MD.  History of Present Illness:   Mr. Husmann was just discharged from the hospital when he got home and had some chest discomfort.    Of note reviewing his most recent hospitalization he was admitted with unsteady gait on 11/4 and discharged on 11/7.  He was found to have multifocal embolic strokes on MRI.  He had an interatrial shunt on echo.  Lower extremity Dopplers were negative for DVT.  He was treated with aspirin and Plavix but because he has is to undergo biopsy of newly discovered liver masses Plavix and aspirin was held.Marland Kitchen  He was scheduled for biopsy yesterday as a matter fact.  This is now rescheduled for Friday.  He was also found to have a 9 mm right upper lobe liver nodule.  Plan was for 72-month follow-up of this.  He said he developed sharp chest discomfort.  This was mid chest.  He never had this before.  It was 6-10 out of intensity.  There was no radiation.  No jaw or arm discomfort.  He got Mylanta and slowly had resolution but it did last for several hours.  He did not have associated nausea vomiting or diaphoresis.  He did not have any palpitations, presyncope or syncope..   In the emergency room his EKG demonstrated no acute changes.  Cardiac enzymes were minimally elevated but flat and nondiagnostic.  He has been active up until getting abdominal discomfort and subsequently getting work-up as above.  Prior to all of this he had been sometimes pushing a self-propelled lawnmower without symptoms.  He never had any  other cardiac work-up.  Past Medical History:  Diagnosis Date   Arthritis    Diabetes mellitus    Hyperlipidemia    Hypertension    Stroke [facility])     Past Surgical History:  Procedure Laterality Date   APPENDECTOMY  1968   CATARACT EXTRACTION Bilateral 07/2000   states 2 weeks ago (first of sept) OD due in Dec    COLONOSCOPY  2004,2009   Negative, Dr. Sharlett Iles    KIDNEY STONE SURGERY  10/2018   PROSTATE BIOPSY  2006   Dr.Sigmund Tannebaum     Home Medications:  Prior to Admission medications   Medication Sig Start Date End Date Taking? Authorizing Provider  aspirin EC 81 MG tablet Take 1 tablet (81 mg total) by mouth daily. Swallow whole. 09/25/21 10/25/21  Antonieta Pert, MD  Cholecalciferol (VITAMIN D3) 2000 UNITS TABS Take 2,000 Units by mouth daily.    [provider]  clopidogrel (PLAVIX) 75 MG tablet Take 1 tablet (75 mg total) by mouth daily. Start after conforming with IR Doctor after biopsy 09/25/21 10/25/21  Antonieta Pert, MD  glucose blood (FREESTYLE LITE) test strip CHECK BLOOD SUGAR ONCE DAILY.  Dx:E11.9 08/04/20   Burchette, Alinda Sierras, MD  Lancets (FREESTYLE) lancets Check blood sugar once daily 03/15/12   Hendricks Limes, MD  metFORMIN (GLUCOPHAGE-XR) 750 MG 24 hr tablet TAKE 1 TABLET(750 MG) BY MOUTH DAILY WITH BREAKFAST Patient taking differently:  Take 750 mg by mouth daily with breakfast. 08/22/21   Burchette, Alinda Sierras, MD  pravastatin (PRAVACHOL) 40 MG tablet Take 1 tablet (40 mg total) by mouth daily. 09/20/21 10/20/21  Antonieta Pert, MD  Semaglutide (RYBELSUS) 7 MG TABS Take 7 mg by mouth daily. Patient not taking: No sig reported 05/23/21   [name], MD  silodosin (RAPAFLO) 8 MG CAPS capsule Take 8 mg by mouth at bedtime.    [provider]  TRAVATAN Z 0.004 % SOLN ophthalmic solution Place 1 drop into both eyes at bedtime. 11/12/16   [provider]    Inpatient Medications: Scheduled Meds:  atorvastatin  40 mg Oral Daily   [START  ON 09/22/2021] influenza vaccine adjuvanted  0.5 mL Intramuscular Tomorrow-1000   latanoprost  1 drop Both Eyes QHS   [START ON 09/22/2021] metoprolol tartrate  100 mg Oral Once   sodium chloride flush  3 mL Intravenous Q12H   tamsulosin  0.4 mg Oral Daily   Continuous Infusions:  sodium chloride     PRN Meds: sodium chloride, acetaminophen **OR** acetaminophen, hydrocortisone cream, nitroGLYCERIN, sodium chloride flush  Allergies:   No Known Allergies  Social History:   Social History   Socioeconomic History   Marital status: Married    Spouse name: Not on file   Number of children: 2   Years of education: Not on file   Highest education level: Not on file  Occupational History   Occupation: retired Airline pilot  Tobacco Use   Smoking status: Not on file   Smokeless tobacco: Never  Vaping Use   Vaping Use: Never used  Substance and Sexual Activity   Alcohol use: Yes    Alcohol/week: 2.0 standard drinks    Types: 2 Glasses of wine per week    Comment: Occasional wine or beer   Drug use: No   Sexual activity: Not on file  Other Topics Concern   Not on file  Social History Narrative   Not on file   Social Determinants of Health   Financial Resource Strain: Low Risk    Difficulty of Paying Living Expenses: Not hard at all  Food Insecurity: No Food Insecurity   Worried About Charity fundraiser in the Last Year: Never true   [place] in the Last Year: Never true  Transportation Needs: No Transportation Needs   Lack of Transportation (Medical): No   Lack of Transportation (Non-Medical): No  Physical Activity: Sufficiently Active   Days of Exercise per Week: 3 days   Minutes of Exercise per Session: 60 min  Stress: No Stress Concern Present   Feeling of Stress : Not at all  Social Connections: Socially Integrated   Frequency of Communication with Friends and Family: Three times a week   Frequency of Social Gatherings with Friends and Family: Three times a  week   Attends Religious Services: More than 4 times per year   Active Member of Clubs or Organizations: Yes   Attends Music therapist: More than 4 times per year   Marital Status: Married  Human resources officer Violence: Not At Risk   Fear of Current or Ex-Partner: No   Emotionally Abused: No   Physically Abused: No   Sexually Abused: No  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  He went to [employer].  He has two daughters.  He is married.   Family History:    Family History  Problem Relation Age of Onset  Stroke Father 54   Hypertension Mother    Coronary artery disease Mother    Diabetes Maternal Aunt    Pancreatic cancer Brother    Stroke Paternal Grandmother    Breast cancer Paternal Aunt    Coronary artery disease Paternal Aunt    Coronary artery disease Maternal Grandmother    Coronary artery disease Maternal Uncle        2 Maternal Uncles    Diabetes Maternal Uncle    Stroke Sister    Heart disease Neg Hx      ROS:  Please see the history of present illness.  Abdominal pain and constipation All other ROS reviewed and negative.     Physical Exam/Data:   Vitals:   09/21/21 0330 09/21/21 0356 09/21/21 0416 09/21/21 1144  BP: (!) 135/91  (!) 139/97 (!) 145/93  Pulse: 73  76 72  Resp: 12  18 18   Temp:  98.1 F (36.7 C) 97.8 F (36.6 C) 98 F (36.7 C)  TempSrc:  Oral Oral Oral  SpO2: 96%  100%   Weight:   88.3 kg   Height:   5\' 10"  (1.778 m)    No intake or output data in the 24 hours ending 09/21/21 1517 Filed Weights   09/21/21 0416  Weight: 88.3 kg   Body mass index is 27.92 kg/m.  GENERAL:  Well appearing HEENT:   Pupils equal round and reactive, fundi not visualized, oral mucosa unremarkable NECK:  No  jugular venous distention, waveform within normal limits, carotid upstroke brisk and symmetric, no bruits, no thyromegaly LYMPHATICS:  No cervical, inguinal adenopathy LUNGS:   Clear to auscultation bilaterally BACK:  No CVA  tenderness CHEST:   Unremarkable HEART:  PMI not displaced or sustained,S1 and S2 within normal limits, no S3, no S4, no clicks, no rubs, no murmurs ABD:  Flat, positive bowel sounds normal in frequency in pitch, no bruits, no rebound, no guarding, no midline pulsatile mass, no hepatomegaly, no splenomegaly EXT:  2 plus pulses throughout, no edema, no cyanosis no clubbing SKIN:  No rashes no nodules NEURO:   Cranial nerves II through XII grossly intact, motor grossly intact throughout PSYCH:    Cognitively intact, oriented to person place and time   EKG:  The EKG was personally reviewed and demonstrates: Normal sinus rhythm, rate 91, left axis deviation, no acute ST-T wave changes. Telemetry:  Telemetry was personally reviewed and demonstrates:  NSR  Relevant CV Studies:  ECHO:    1. Left ventricular ejection fraction, by estimation, is 60 to 65%. The  left ventricle has normal function. The left ventricle has no regional  wall motion abnormalities. There is mild left ventricular hypertrophy.  Left ventricular diastolic parameters  are consistent with Grade I diastolic dysfunction (impaired relaxation).   2. Right ventricular systolic function is normal. The right ventricular  size is normal.   3. The mitral valve is normal in structure. No evidence of mitral valve  regurgitation. No evidence of mitral stenosis.   4. The aortic valve is tricuspid. Aortic valve regurgitation is not  visualized. No aortic stenosis is present.   5. Aortic dilatation noted. There is mild dilatation of the ascending  aorta, measuring 38 mm.   6. Agitated saline contrast bubble study was positive with shunting  observed within 3-6 cardiac cycles suggestive of interatrial shunt.   Laboratory Data:  Chemistry Recent Labs  Lab 09/18/21 0020 09/20/21 2050 09/21/21 0454  NA 136 134* 135  K 3.7 4.2 4.3  CL 104 97* 101  CO2 27 24 26   GLUCOSE 130* 239* 140*  BUN 13 21 18   CREATININE 1.28* 1.32* 1.17   CALCIUM 9.0 8.9 9.0  GFRNONAA 56* 54* >60  ANIONGAP 5 13 8     Recent Labs  Lab 09/16/21 1607 09/18/21 0020  PROT 7.0 6.3*  ALBUMIN 3.5 3.2*  AST 28 29  ALT 19 18  ALKPHOS 69 68  BILITOT 0.7 0.9   Hematology Recent Labs  Lab 09/18/21 0020 09/20/21 2050 09/21/21 0454  WBC 4.9 5.9 5.1  RBC 4.87 4.90 4.70  HGB 13.6 13.7 13.1  HCT 41.4 41.8 40.3  MCV 85.0 85.3 85.7  MCH 27.9 28.0 27.9  MCHC 32.9 32.8 32.5  RDW 12.8 12.9 12.9  PLT 177 187 195   Cardiac EnzymesNo results for input(s): TROPONINI in the last 168 hours. No results for input(s): TROPIPOC in the last 168 hours.  BNPNo results for input(s): BNP, PROBNP in the last 168 hours.  DDimer No results for input(s): DDIMER in the last 168 hours.  Radiology/Studies:  DG Chest 2 View  Result Date: 09/20/2021 CLINICAL DATA:  Chest pain EXAM: CHEST - 2 VIEW COMPARISON:  10/27/2008 FINDINGS: The heart size and mediastinal contours are within normal limits. Both lungs are clear. The visualized skeletal structures are unremarkable. IMPRESSION: No active cardiopulmonary disease. Electronically Signed   By: Donavan Foil M.D.   On: 09/20/2021 21:19   VAS [place] TRANSCRANIAL DOPPLER W BUBBLES  Result Date: 09/20/2021  Transcranial Doppler with Bubble Patient Name:  BYFORD SCHOOLS  Date of Exam:   09/19/2021 Medical Rec #: [id number]        Accession #:    [id number] Date of Birth: November 02, 1941        Patient Gender: M Patient Age:   64 years Exam Location:  [facility] Procedure:      VAS [place] TRANSCRANIAL DOPPLER W BUBBLES Referring Phys: Elwin Sleight [place] --------------------------------------------------------------------------------  Indications: Stroke. Comparison Study: 09-17-2021 Echo complete w/ bubble was positive for                   intracardiac shunting. Performing Technologist: Darlin Coco RDMS, RVT  Examination Guidelines: A complete evaluation includes B-mode imaging, spectral Doppler, color Doppler, and power Doppler as  needed of all accessible portions of each vessel. Bilateral testing is considered an integral part of a complete examination. Limited examinations for reoccurring indications may be performed as noted.  Summary:  A vascular evaluation was performed. The right middle cerebral artery was studied. An IV was inserted into the patient's left forearm. Verbal informed consent was obtained.  Between 15 and 30 high intensity transient signals (HITS) were observed at rest, and between 30 and 100 were observed with valsalva, indicating a Spencer grade 3 patent foramen ovale (PFO). Positive TCD Bubble study indicative of a medium size right to left shunt *See table(s) above for TCD measurements and observations.  Diagnosing physician: Antony Contras MD Electronically signed by Antony Contras MD on 09/20/2021 at 5:55:43 PM.    Final    VAS [place] LOWER EXTREMITY VENOUS (DVT)  Result Date: 09/18/2021  Lower Venous DVT Study Patient Name:  NAZIAH WECKERLY  Date of Exam:   09/18/2021 Medical Rec #: [id number]        Accession #:    [id number] Date of Birth: 06-Apr-1941        Patient Gender: M Patient Age:   54 years Exam Location:  [facility] Procedure:  VAS [place] LOWER EXTREMITY VENOUS (DVT) Referring Phys: JEFFREY MCCLUNG --------------------------------------------------------------------------------  Indications: Stroke.  Comparison Study: No prior study on file Performing Technologist: Sharion Dove RVS  Examination Guidelines: A complete evaluation includes B-mode imaging, spectral Doppler, color Doppler, and power Doppler as needed of all accessible portions of each vessel. Bilateral testing is considered an integral part of a complete examination. Limited examinations for reoccurring indications may be performed as noted. The reflux portion of the exam is performed with the patient in reverse Trendelenburg.  +---------+---------------+---------+-----------+----------+--------------+ RIGHT     CompressibilityPhasicitySpontaneityPropertiesThrombus Aging +---------+---------------+---------+-----------+----------+--------------+ CFV      Full           Yes      Yes                                 +---------+---------------+---------+-----------+----------+--------------+ SFJ      Full                                                        +---------+---------------+---------+-----------+----------+--------------+ FV Prox  Full                                                        +---------+---------------+---------+-----------+----------+--------------+ FV Mid   Full                                                        +---------+---------------+---------+-----------+----------+--------------+ FV DistalFull                                                        +---------+---------------+---------+-----------+----------+--------------+ PFV      Full                                                        +---------+---------------+---------+-----------+----------+--------------+ POP      Full           Yes      Yes                                 +---------+---------------+---------+-----------+----------+--------------+ PTV      Full                                                        +---------+---------------+---------+-----------+----------+--------------+ PERO     Full                                                        +---------+---------------+---------+-----------+----------+--------------+   +---------+---------------+---------+-----------+----------+--------------+  LEFT     CompressibilityPhasicitySpontaneityPropertiesThrombus Aging +---------+---------------+---------+-----------+----------+--------------+ CFV      Full           Yes      Yes                                 +---------+---------------+---------+-----------+----------+--------------+ SFJ      Full                                                         +---------+---------------+---------+-----------+----------+--------------+ FV Prox  Full                                                        +---------+---------------+---------+-----------+----------+--------------+ FV Mid   Full                                                        +---------+---------------+---------+-----------+----------+--------------+ FV DistalFull                                                        +---------+---------------+---------+-----------+----------+--------------+ PFV      Full                                                        +---------+---------------+---------+-----------+----------+--------------+ POP      Full           No       Yes                                 +---------+---------------+---------+-----------+----------+--------------+ PTV      Full                                                        +---------+---------------+---------+-----------+----------+--------------+ PERO     Full                                                        +---------+---------------+---------+-----------+----------+--------------+     Summary: BILATERAL: - No evidence of deep vein thrombosis seen in the lower extremities, bilaterally. Bilateral popliteal, posterior tibial, and peroneal veins are dilated with rouleaux flow, but are easily compressible. -No evidence of popliteal cyst, bilaterally.   *See table(s) above  for measurements and observations. Electronically signed by Monica Martinez MD on 09/18/2021 at 11:16:48 AM.    Final     Assessment and Plan:   CHEST PAIN: Chest pain has predominantly nonanginal features.  However, he has significant cardiovascular risk factors.  His troponins are minimally elevated and nondiagnostic.  Pretest probability of obstructive coronary disease is at least moderately high.  Coronary CTA is indicated and we will arrange this.  HTN: His blood pressures are  mildly elevated.  He likely will need blood pressure medication and would be appropriate for ACE or ARB with diabetes.  We can institute this before discharge.  DYSLIPIDEMIA:   LDL was 97 with an HDL of 57.  He is going to switch from pravastatin to Lipitor.  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  Restart Plavix and aspirin when able.  DM: A1c of 6.9.  Per the primary team.  For questions or updates, please contact Christine Please consult [email or web address] for contact info under Cardiology/STEMI.   Signed, Minus Breeding, MD  09/21/2021 3:17 PM

## 2021-09-21 NOTE — Telephone Encounter (Signed)
Patient called and was informed of message patient stated he was readmitted into the hospital

## 2021-09-21 NOTE — Progress Notes (Signed)
PROGRESS NOTE    VIRAAT VANPATTEN  TJQ:[id number] DOB: 1941-02-19 DOA: 09/20/2021 PCP: [name], MD   Chief Complaint  Patient presents with   Chest Pain  Brief Narrative/Hospital Course: [name], 80 y.o. male with PMH of DMT2, recent CVA, HTN, HLD who was discharged 11/7 from [facility] after acute CVA, presents to the ED with complaint of chest pressure and tightness. Patient was placed on aspirin Plavix for acute severe but given that he has a liver mass and did not biopsy it was on hold x5 days for biopsy on coming Friday. In the ED troponin mildly positive no ischemic change in the EKG. cardiologist consulted and patient was admitted for further work-up   Subjective: No chest pain this am No shortness of breath. No new complaints Assessment & Plan:  Chest pain Mildly positive troponin: Chest pain appears to have nonanginal features given his cardiovascular factors recent stroke and slightly positive troponin cardiology consulted, planning for coronary CTA tomorrow.  Currently no chest pain.  T2DM: Blood sugar well controlled.  Monitor Recent Labs  Lab 09/18/21 1518 09/18/21 2243 09/19/21 0824 09/19/21 1121 09/19/21 1641  GLUCAP 206* 285* 144* 177* 133*     Essential hypertension: Mildly elevated, monitor and adjust per cardiology  Recent CVA-multifocal embolic stroke LDL 97 HDL 57 continue Lipitor.  Intracardiac started on echo and positive transcranial Doppler. antiplatelets on hold pending biopsy  Liver masses noted in October 22-for biopsy after holding his Plavix x5 days, this is planned as outpatient at [facility] long on coming Friday  9 mm pulmonary nodule seen on CTA will need CT chest in 3 months, continue biopsy of the liver and further work-up as outpatient  Interatrial cardiac shunt: Negative for DVT, per neurology no further work-up management likely chronic.  BPH on Flomax CKD 3 IIIa-stable Recent Labs  Lab 09/16/21 1607 09/16/21 1617  09/18/21 0020 09/20/21 2050 09/21/21 0454  BUN 19 20 13 21 18   CREATININE 1.05 1.20 1.28* 1.32* 1.17    DVT prophylaxis: SCDs Start: 09/21/21 0427 Code Status:   Code Status: Full Code Family Communication: plan of care discussed with patient at bedside. Status is: Observation  Remains hospitalized for ongoing management of chest pain and cardiology eval.   Objective: Vitals last 24 hrs: Vitals:   09/21/21 0330 09/21/21 0356 09/21/21 0416 09/21/21 1144  BP: (!) 135/91  (!) 139/97 (!) 145/93  Pulse: 73  76 72  Resp: 12  18 18   Temp:  98.1 F (36.7 C) 97.8 F (36.6 C) 98 F (36.7 C)  TempSrc:  Oral Oral Oral  SpO2: 96%  100%   Weight:   88.3 kg   Height:   5\' 10"  (1.778 m)    Weight change:  No intake or output data in the 24 hours ending 09/21/21 1508 Net IO Since Admission: No IO data has been entered for this period [09/21/21 1508]   Physical Examination: General exam: AA0x3, weak,older than stated age. HEENT:Oral mucosa moist, Ear/Nose WNL grossly,dentition normal. Respiratory system: B/l clear BS, no use of accessory muscle, non tender. Cardiovascular system: S1 & S2 +,No JVD. Gastrointestinal system: Abdomen soft, NT,ND, BS+. Nervous System:Alert, awake, moving extremities. Extremities: edema none, distal peripheral pulses palpable.  Skin: No rashes, no icterus. MSK: Normal muscle bulk, tone, power.  Medications reviewed:  Scheduled Meds:  [START ON 09/22/2021] influenza vaccine adjuvanted  0.5 mL Intramuscular Tomorrow-1000   latanoprost  1 drop Both Eyes QHS   pravastatin  40 mg Oral  Daily   sodium chloride flush  3 mL Intravenous Q12H   tamsulosin  0.4 mg Oral Daily   Continuous Infusions:  sodium chloride     Diet Order             Diet Heart Room service appropriate? Yes; Fluid consistency: Thin  Diet effective now                          Weight change:   Wt Readings from Last 3 Encounters:  09/21/21 88.3 kg  09/16/21 90.7 kg   08/29/21 91.1 kg     Consultants:see note  Procedures:see note Antimicrobials: Anti-infectives (From admission, onward)    None      Culture/Microbiology No results found for: SDES, SPECREQUEST, CULT, REPTSTATUS  Other culture-see note  Unresulted Labs (From admission, onward)    None     Data Reviewed: I have personally reviewed following labs and imaging studies CBC: Recent Labs  Lab 09/16/21 1607 09/16/21 1617 09/17/21 0500 09/18/21 0020 09/20/21 2050 09/21/21 0454  WBC 4.2  --  4.0 4.9 5.9 5.1  NEUTROABS 2.1  --  2.5  --   --   --   HGB 13.5 14.3 14.2 13.6 13.7 13.1  HCT 41.4 42.0 42.3 41.4 41.8 40.3  MCV 86.8  --  85.6 85.0 85.3 85.7  PLT 162  --  198 177 187 409   Basic Metabolic Panel: Recent Labs  Lab 09/16/21 1607 09/16/21 1617 09/18/21 0020 09/20/21 2050 09/21/21 0454  NA 138 140 136 134* 135  K 5.0 4.9 3.7 4.2 4.3  CL 104 103 104 97* 101  CO2 28  --  27 24 26   GLUCOSE 162* 160* 130* 239* 140*  BUN 19 20 13 21 18   CREATININE 1.05 1.20 1.28* 1.32* 1.17  CALCIUM 9.2  --  9.0 8.9 9.0  MG  --   --  1.9  --   --    GFR: Estimated Creatinine Clearance: 54.5 mL/min (by C-G formula based on SCr of 1.17 mg/dL). Liver Function Tests: Recent Labs  Lab 09/16/21 1607 09/18/21 0020  AST 28 29  ALT 19 18  ALKPHOS 69 68  BILITOT 0.7 0.9  PROT 7.0 6.3*  ALBUMIN 3.5 3.2*   No results for input(s): LIPASE, AMYLASE in the last 168 hours. Recent Labs  Lab 09/16/21 1903  AMMONIA 23   Coagulation Profile: Recent Labs  Lab 09/16/21 1607  INR 1.1   Cardiac Enzymes: No results for input(s): CKTOTAL, CKMB, CKMBINDEX, TROPONINI in the last 168 hours. BNP (last 3 results) No results for input(s): PROBNP in the last 8760 hours. HbA1C: No results for input(s): HGBA1C in the last 72 hours. CBG: Recent Labs  Lab 09/18/21 1518 09/18/21 2243 09/19/21 0824 09/19/21 1121 09/19/21 1641  GLUCAP 206* 285* 144* 177* 133*   Lipid Profile: No  results for input(s): CHOL, HDL, LDLCALC, TRIG, CHOLHDL, LDLDIRECT in the last 72 hours. Thyroid Function Tests: No results for input(s): TSH, T4TOTAL, FREET4, T3FREE, THYROIDAB in the last 72 hours. Anemia Panel: No results for input(s): VITAMINB12, FOLATE, FERRITIN, TIBC, IRON, RETICCTPCT in the last 72 hours. Sepsis Labs: No results for input(s): PROCALCITON, LATICACIDVEN in the last 168 hours.  Recent Results (from the past 240 hour(s))  Resp Panel by RT-PCR (Flu A&B, Covid) Nasopharyngeal Swab     Status: None   Collection Time: 09/16/21  6:00 PM   Specimen: Nasopharyngeal Swab; Nasopharyngeal(NP) swabs in vial transport medium  Result Value Ref Range Status   SARS Coronavirus 2 by RT PCR NEGATIVE NEGATIVE Final    Comment: (NOTE) SARS-CoV-2 target nucleic acids are NOT DETECTED.  The SARS-CoV-2 RNA is generally detectable in upper respiratory specimens during the acute phase of infection. The lowest concentration of SARS-CoV-2 viral copies this assay can detect is 138 copies/mL. A negative result does not preclude SARS-Cov-2 infection and should not be used as the sole basis for treatment or other patient management decisions. A negative result may occur with  improper specimen collection/handling, submission of specimen other than nasopharyngeal swab, presence of viral mutation(s) within the areas targeted by this assay, and inadequate number of viral copies(<138 copies/mL). A negative result must be combined with clinical observations, patient history, and epidemiological information. The expected result is Negative.  Fact Sheet for Patients:  EntrepreneurPulse.com.au  Fact Sheet for Healthcare Providers:  IncredibleEmployment.be  This test is no t yet approved or cleared by the [place] FDA and  has been authorized for detection and/or diagnosis of SARS-CoV-2 by FDA under an Emergency Use Authorization (EUA). This EUA will remain   in effect (meaning this test can be used) for the duration of the COVID-19 declaration under Section 564(b)(1) of the Act, 21 U.S.C.section 360bbb-3(b)(1), unless the authorization is terminated  or revoked sooner.       Influenza A by PCR NEGATIVE NEGATIVE Final   Influenza B by PCR NEGATIVE NEGATIVE Final    Comment: (NOTE) The Xpert Xpress SARS-CoV-2/FLU/RSV plus assay is intended as an aid in the diagnosis of influenza from Nasopharyngeal swab specimens and should not be used as a sole basis for treatment. Nasal washings and aspirates are unacceptable for Xpert Xpress SARS-CoV-2/FLU/RSV testing.  Fact Sheet for Patients: EntrepreneurPulse.com.au  Fact Sheet for Healthcare Providers: IncredibleEmployment.be  This test is not yet approved or cleared by the [place] FDA and has been authorized for detection and/or diagnosis of SARS-CoV-2 by FDA under an Emergency Use Authorization (EUA). This EUA will remain in effect (meaning this test can be used) for the duration of the COVID-19 declaration under Section 564(b)(1) of the Act, 21 U.S.C. section 360bbb-3(b)(1), unless the authorization is terminated or revoked.  Performed at [facility], [place] [address], [place], [place] [address]   Resp Panel by RT-PCR (Flu A&B, Covid) Nasopharyngeal Swab     Status: None   Collection Time: 09/21/21  2:13 AM   Specimen: Nasopharyngeal Swab; Nasopharyngeal(NP) swabs in vial transport medium  Result Value Ref Range Status   SARS Coronavirus 2 by RT PCR NEGATIVE NEGATIVE Final    Comment: (NOTE) SARS-CoV-2 target nucleic acids are NOT DETECTED.  The SARS-CoV-2 RNA is generally detectable in upper respiratory specimens during the acute phase of infection. The lowest concentration of SARS-CoV-2 viral copies this assay can detect is 138 copies/mL. A negative result does not preclude SARS-Cov-2 infection and should not be used as  the sole basis for treatment or other patient management decisions. A negative result may occur with  improper specimen collection/handling, submission of specimen other than nasopharyngeal swab, presence of viral mutation(s) within the areas targeted by this assay, and inadequate number of viral copies(<138 copies/mL). A negative result must be combined with clinical observations, patient history, and epidemiological information. The expected result is Negative.  Fact Sheet for Patients:  EntrepreneurPulse.com.au  Fact Sheet for Healthcare Providers:  IncredibleEmployment.be  This test is no t yet approved or cleared by the [place] and  has been authorized for  detection and/or diagnosis of SARS-CoV-2 by FDA under an Emergency Use Authorization (EUA). This EUA will remain  in effect (meaning this test can be used) for the duration of the COVID-19 declaration under Section 564(b)(1) of the Act, 21 U.S.C.section 360bbb-3(b)(1), unless the authorization is terminated  or revoked sooner.       Influenza A by PCR NEGATIVE NEGATIVE Final   Influenza B by PCR NEGATIVE NEGATIVE Final    Comment: (NOTE) The Xpert Xpress SARS-CoV-2/FLU/RSV plus assay is intended as an aid in the diagnosis of influenza from Nasopharyngeal swab specimens and should not be used as a sole basis for treatment. Nasal washings and aspirates are unacceptable for Xpert Xpress SARS-CoV-2/FLU/RSV testing.  Fact Sheet for Patients: EntrepreneurPulse.com.au  Fact Sheet for Healthcare Providers: IncredibleEmployment.be  This test is not yet approved or cleared by the [place] FDA and has been authorized for detection and/or diagnosis of SARS-CoV-2 by FDA under an Emergency Use Authorization (EUA). This EUA will remain in effect (meaning this test can be used) for the duration of the COVID-19 declaration under Section 564(b)(1) of  the Act, 21 U.S.C. section 360bbb-3(b)(1), unless the authorization is terminated or revoked.  Performed at [facility], [place] [address] [place], [place] [address]      Radiology Studies: DG Chest 2 View  Result Date: 09/20/2021 CLINICAL DATA:  Chest pain EXAM: CHEST - 2 VIEW COMPARISON:  10/27/2008 FINDINGS: The heart size and mediastinal contours are within normal limits. Both lungs are clear. The visualized skeletal structures are unremarkable. IMPRESSION: No active cardiopulmonary disease. Electronically Signed   By: Donavan Foil M.D.   On: 09/20/2021 21:19     LOS: 0 days   Antonieta Pert, MD [facility]  09/21/2021, 3:08 PM

## 2021-09-21 NOTE — Telephone Encounter (Signed)
See other note from 11/9 for further documentation.

## 2021-09-21 NOTE — ED Provider Notes (Signed)
D/w dr Tonie Griffith for admission to hospitalist Cardiology consultation is currently pending Pt had recent stroke and was just in hospital earlier this week    Ripley Fraise, MD 09/21/21 [phone]

## 2021-09-22 ENCOUNTER — Observation Stay (HOSPITAL_COMMUNITY): Payer: Medicare HMO

## 2021-09-22 ENCOUNTER — Other Ambulatory Visit: Payer: Self-pay | Admitting: Internal Medicine

## 2021-09-22 DIAGNOSIS — I10 Essential (primary) hypertension: Secondary | ICD-10-CM

## 2021-09-22 DIAGNOSIS — E785 Hyperlipidemia, unspecified: Secondary | ICD-10-CM | POA: Diagnosis not present

## 2021-09-22 DIAGNOSIS — R079 Chest pain, unspecified: Secondary | ICD-10-CM | POA: Diagnosis not present

## 2021-09-22 DIAGNOSIS — R072 Precordial pain: Secondary | ICD-10-CM | POA: Diagnosis not present

## 2021-09-22 LAB — BASIC METABOLIC PANEL
Anion gap: 6 (ref 5–15)
BUN: 15 mg/dL (ref 8–23)
CO2: 26 mmol/L (ref 22–32)
Calcium: 9 mg/dL (ref 8.9–10.3)
Chloride: 103 mmol/L (ref 98–111)
Creatinine, Ser: 1.12 mg/dL (ref 0.61–1.24)
GFR, Estimated: >60 mL/min (ref >60)
Glucose, Bld: 167 mg/dL — ABNORMAL HIGH (ref 70–99)
Potassium: 4.4 mmol/L (ref 3.5–5.1)
Sodium: 135 mmol/L (ref 135–145)

## 2021-09-22 LAB — GLUCOSE, CAPILLARY
Glucose-Capillary: 141 mg/dL — ABNORMAL HIGH (ref 70–99)
Glucose-Capillary: 150 mg/dL — ABNORMAL HIGH (ref 70–99)
Glucose-Capillary: 187 mg/dL — ABNORMAL HIGH (ref 70–99)

## 2021-09-22 LAB — PLATELET INHIBITION P2Y12

## 2021-09-22 MED ORDER — IOHEXOL 350 MG/ML SOLN
100.0000 mL | Freq: Once | INTRAVENOUS | Status: AC | PRN
  Administered 2021-09-22: 100 mL via INTRAVENOUS

## 2021-09-22 MED ORDER — INSULIN ASPART 100 UNIT/ML IJ SOLN: 0.0000 [IU] | Freq: Every day | INTRAMUSCULAR | Status: DC

## 2021-09-22 MED ORDER — INSULIN ASPART 100 UNIT/ML IJ SOLN
2.0000 [IU] | Freq: Once | INTRAMUSCULAR | Status: AC
  Administered 2021-09-22: 2 [IU] via SUBCUTANEOUS

## 2021-09-22 MED ORDER — ATORVASTATIN CALCIUM 40 MG PO TABS: 40.0000 mg | ORAL_TABLET | Freq: Every day | ORAL | 0 refills | Status: DC

## 2021-09-22 MED ORDER — INSULIN ASPART 100 UNIT/ML IJ SOLN: 0.0000 [IU] | Freq: Three times a day (TID) | INTRAMUSCULAR | Status: DC

## 2021-09-22 MED ORDER — NITROGLYCERIN 0.4 MG SL SUBL
SUBLINGUAL_TABLET | SUBLINGUAL | Status: AC
  Filled 2021-09-22: qty 2

## 2021-09-22 NOTE — Care Management Obs Status (Signed)
[name] NOTIFICATION   Patient Details  Name: Robert Warner MRN: [id number] Date of Birth: 03-04-1941   Medicare Observation Status Notification Given:  Yes    Bethena Roys, RN 09/22/2021, 11:47 AM

## 2021-09-22 NOTE — Progress Notes (Signed)
Pt called and stated "You did not give me insulin after my dinner. I feel my sugar is very high." Checked Pt's CBG. CBG = 187. Paged [facility] on call

## 2021-09-22 NOTE — Discharge Summary (Signed)
Physician Discharge Summary  Robert Warner IWL:[id number] DOB: 1941-10-16 DOA: 09/20/2021  PCP: [name], MD  Admit date: 09/20/2021 Discharge date: 09/22/2021  Admitted From: home Disposition:  home  Recommendations for Outpatient Follow-up:  Follow up with PCP in 1-2 weeks  Home Health:no  Equipment/Devices: none  Discharge Condition: Stable Code Status:   Code Status: Prior Diet recommendation:  Diet Order             Diet - low sodium heart healthy                    Brief/Interim Summary:  80 y.o. male with PMH of DMT2, recent CVA, HTN, HLD who was discharged 11/7 from [facility] after acute CVA, presents to the ED with complaint of chest pressure and tightness. Patient was placed on aspirin Plavix for acute severe but given that he has a liver mass and did not biopsy it was on hold x5 days for biopsy on coming Friday. In the ED troponin mildly positive no ischemic change in the EKG. cardiologist consulted and patient was admitted for further work-up Patient was seen by cardiology felt to be nonanginal but given his significant risk factors recent stroke cardiology kept him overnight to obtain CT coronaries CT coronaries were obtained and no acute finding) by cardiology and okay to discharge home.  Discharge Diagnoses:   hest pain Mildly positive troponin: Chest pain appears to have nonanginal features given his cardiovascular factors recent stroke and slightly positive troponin cardiology consulted and ordered coronary CTA that was unremarkable and patient was discharged home after discussion with the cardiology cardiology. Currently no chest pain.  His antiplatelets aspirin and Plavix are held for liver biopsy tomorrow.   T2DM: Blood sugar well controlled.  Holding p.o. meds for now continue sliding scale insulin insulin while inpatient Recent Labs  Lab 09/19/21 1641 09/22/21 0040 09/22/21 0829 09/22/21 1135 09/23/21 1057  GLUCAP 133* 187* 150* 141* 130*      Essential hypertension: Mildly elevated, monitor and adjust per cardiology.   Recent CVA-multifocal embolic stroke LDL 97 HDL 57 continue Lipitor.  Intracardiac started on echo and positive transcranial Doppler. antiplatelets on hold pending biopsy   Liver masses noted in October 22-for biopsy after holding his Plavix x5 days, this is planned as outpatient at [facility] long on coming Friday 09/23/21   9 mm pulmonary nodule seen on CTA will need CT chest in 3 months, continue biopsy of the liver and further work-up as outpatient   Interatrial cardiac shunt: Negative for DVT, per neurology no further work-up management likely chronic.   BPH on Flomax  CKD 3 IIIa-stable Recent Labs  Lab 09/18/21 0020 09/20/21 2050 09/21/21 0454 09/22/21 0700 09/23/21 1118  BUN 13 21 18 15 22   CREATININE 1.28* 1.32* 1.17 1.12 1.28*      Consults: Cardiology  Subjective: Patient was alert awake oriented no chest pain.  Eager to go home waiting for biopsy tomorrow. Discharge Exam: Vitals:   09/22/21 1119 09/22/21 1135  BP: 127/83   Pulse:  60  Resp:  16  Temp:  98.8 F (37.1 C)  SpO2:  97%   General: Pt is alert, awake, not in acute distress Cardiovascular: RRR, S1/S2 +, no rubs, no gallops Respiratory: CTA bilaterally, no wheezing, no rhonchi Abdominal: Soft, NT, ND, bowel sounds + Extremities: no edema, no cyanosis  Discharge Instructions  Discharge Instructions     Diet - low sodium heart healthy   Complete by: As directed  Discharge instructions   Complete by: As directed    Follow-up with your outpatient lung mass work-up.  Follow-up with PCP cardiology neurology.  Please call call MD or return to ER for similar or worsening recurring problem that brought you to hospital or if any fever,nausea/vomiting,abdominal pain, uncontrolled pain, chest pain,  shortness of breath or any other alarming symptoms.  Please follow-up your doctor as instructed in a week time and call the  office for appointment.  Please avoid alcohol, smoking, or any other illicit substance and maintain healthy habits including taking your regular medications as prescribed.  You were cared for by a hospitalist during your hospital stay. If you have any questions about your discharge medications or the care you received while you were in the hospital after you are discharged, you can call the unit and ask to speak with the hospitalist on call if the hospitalist that took care of you is not available.  Once you are discharged, your primary care physician will handle any further medical issues. Please note that NO REFILLS for any discharge medications will be authorized once you are discharged, as it is imperative that you return to your primary care physician (or establish a relationship with a primary care physician if you do not have one) for your aftercare needs so that they can reassess your need for medications and monitor your lab values   Increase activity slowly   Complete by: As directed       Allergies as of 09/22/2021   No Known Allergies      Medication List     STOP taking these medications    pravastatin 40 MG tablet Commonly known as: PRAVACHOL       TAKE these medications    aspirin EC 81 MG tablet Take 1 tablet (81 mg total) by mouth daily. Swallow whole. Start taking on: September 25, 2021 Notes to patient: Restart Sunday, November 13th (Confirm with Interventional Radiology MD after biopsy is done)   atorvastatin 40 MG tablet Commonly known as: LIPITOR Take 1 tablet (40 mg total) by mouth daily.   clopidogrel 75 MG tablet Commonly known as: Plavix Take 1 tablet (75 mg total) by mouth daily. Start after conforming with IR Doctor after biopsy Start taking on: September 25, 2021   freestyle lancets Check blood sugar once daily Notes to patient: As Before, As Directed   FREESTYLE LITE test strip Generic drug: glucose blood CHECK BLOOD SUGAR ONCE DAILY.   Dx:E11.9 Notes to patient: As Before, As Directed   metFORMIN 750 MG 24 hr tablet Commonly known as: GLUCOPHAGE-XR TAKE 1 TABLET(750 MG) BY MOUTH DAILY WITH BREAKFAST What changed: See the new instructions.   silodosin 8 MG Caps capsule Commonly known as: RAPAFLO Take 8 mg by mouth at bedtime.   Travatan Z 0.004 % Soln ophthalmic solution Generic drug: Travoprost (BAK Free) Place 1 drop into both eyes at bedtime.   Vitamin D3 50 MCG (2000 UT) Tabs Take 2,000 Units by mouth daily.        No Known Allergies  The results of significant diagnostics from this hospitalization (including imaging, microbiology, ancillary and laboratory) are listed below for reference.    Microbiology: Recent Results (from the past 240 hour(s))  Resp Panel by RT-PCR (Flu A&B, Covid) Nasopharyngeal Swab     Status: None   Collection Time: 09/16/21  6:00 PM   Specimen: Nasopharyngeal Swab; Nasopharyngeal(NP) swabs in vial transport medium  Result Value Ref Range Status   SARS  Coronavirus 2 by RT PCR NEGATIVE NEGATIVE Final    Comment: (NOTE) SARS-CoV-2 target nucleic acids are NOT DETECTED.  The SARS-CoV-2 RNA is generally detectable in upper respiratory specimens during the acute phase of infection. The lowest concentration of SARS-CoV-2 viral copies this assay can detect is 138 copies/mL. A negative result does not preclude SARS-Cov-2 infection and should not be used as the sole basis for treatment or other patient management decisions. A negative result may occur with  improper specimen collection/handling, submission of specimen other than nasopharyngeal swab, presence of viral mutation(s) within the areas targeted by this assay, and inadequate number of viral copies(<138 copies/mL). A negative result must be combined with clinical observations, patient history, and epidemiological information. The expected result is Negative.  Fact Sheet for Patients:   EntrepreneurPulse.com.au  Fact Sheet for Healthcare Providers:  IncredibleEmployment.be  This test is no t yet approved or cleared by the [place] FDA and  has been authorized for detection and/or diagnosis of SARS-CoV-2 by FDA under an Emergency Use Authorization (EUA). This EUA will remain  in effect (meaning this test can be used) for the duration of the COVID-19 declaration under Section 564(b)(1) of the Act, 21 U.S.C.section 360bbb-3(b)(1), unless the authorization is terminated  or revoked sooner.       Influenza A by PCR NEGATIVE NEGATIVE Final   Influenza B by PCR NEGATIVE NEGATIVE Final    Comment: (NOTE) The Xpert Xpress SARS-CoV-2/FLU/RSV plus assay is intended as an aid in the diagnosis of influenza from Nasopharyngeal swab specimens and should not be used as a sole basis for treatment. Nasal washings and aspirates are unacceptable for Xpert Xpress SARS-CoV-2/FLU/RSV testing.  Fact Sheet for Patients: EntrepreneurPulse.com.au  Fact Sheet for Healthcare Providers: IncredibleEmployment.be  This test is not yet approved or cleared by the [place] FDA and has been authorized for detection and/or diagnosis of SARS-CoV-2 by FDA under an Emergency Use Authorization (EUA). This EUA will remain in effect (meaning this test can be used) for the duration of the COVID-19 declaration under Section 564(b)(1) of the Act, 21 U.S.C. section 360bbb-3(b)(1), unless the authorization is terminated or revoked.  Performed at [facility], [place] [address] [place], [place] [address]   Resp Panel by RT-PCR (Flu A&B, Covid) Nasopharyngeal Swab     Status: None   Collection Time: 09/21/21  2:13 AM   Specimen: Nasopharyngeal Swab; Nasopharyngeal(NP) swabs in vial transport medium  Result Value Ref Range Status   SARS Coronavirus 2 by RT PCR NEGATIVE NEGATIVE Final    Comment:  (NOTE) SARS-CoV-2 target nucleic acids are NOT DETECTED.  The SARS-CoV-2 RNA is generally detectable in upper respiratory specimens during the acute phase of infection. The lowest concentration of SARS-CoV-2 viral copies this assay can detect is 138 copies/mL. A negative result does not preclude SARS-Cov-2 infection and should not be used as the sole basis for treatment or other patient management decisions. A negative result may occur with  improper specimen collection/handling, submission of specimen other than nasopharyngeal swab, presence of viral mutation(s) within the areas targeted by this assay, and inadequate number of viral copies(<138 copies/mL). A negative result must be combined with clinical observations, patient history, and epidemiological information. The expected result is Negative.  Fact Sheet for Patients:  EntrepreneurPulse.com.au  Fact Sheet for Healthcare Providers:  IncredibleEmployment.be  This test is no t yet approved or cleared by the [place] FDA and  has been authorized for detection and/or diagnosis of SARS-CoV-2 by FDA under  an Emergency Use Authorization (EUA). This EUA will remain  in effect (meaning this test can be used) for the duration of the COVID-19 declaration under Section 564(b)(1) of the Act, 21 U.S.C.section 360bbb-3(b)(1), unless the authorization is terminated  or revoked sooner.       Influenza A by PCR NEGATIVE NEGATIVE Final   Influenza B by PCR NEGATIVE NEGATIVE Final    Comment: (NOTE) The Xpert Xpress SARS-CoV-2/FLU/RSV plus assay is intended as an aid in the diagnosis of influenza from Nasopharyngeal swab specimens and should not be used as a sole basis for treatment. Nasal washings and aspirates are unacceptable for Xpert Xpress SARS-CoV-2/FLU/RSV testing.  Fact Sheet for Patients: EntrepreneurPulse.com.au  Fact Sheet for Healthcare  Providers: IncredibleEmployment.be  This test is not yet approved or cleared by the [place] FDA and has been authorized for detection and/or diagnosis of SARS-CoV-2 by FDA under an Emergency Use Authorization (EUA). This EUA will remain in effect (meaning this test can be used) for the duration of the COVID-19 declaration under Section 564(b)(1) of the Act, 21 U.S.C. section 360bbb-3(b)(1), unless the authorization is terminated or revoked.  Performed at [facility], [place] [address] [place],  [address]     Procedures/Studies: CT ANGIO HEAD W OR WO CONTRAST  Result Date: 09/16/2021 CLINICAL DATA:  Dizziness EXAM: CT ANGIOGRAPHY HEAD AND NECK TECHNIQUE: Multidetector CT imaging of the head and neck was performed using the standard protocol during bolus administration of intravenous contrast. Multiplanar CT image reconstructions and MIPs were obtained to evaluate the vascular anatomy. Carotid stenosis measurements (when applicable) are obtained utilizing NASCET criteria, using the distal internal carotid diameter as the denominator. CONTRAST:  61mL OMNIPAQUE IOHEXOL 350 MG/ML SOLN COMPARISON:  None. FINDINGS: CTA NECK FINDINGS SKELETON: There is no bony spinal canal stenosis. No lytic or blastic lesion. OTHER NECK: Normal pharynx, larynx and major salivary glands. No cervical lymphadenopathy. Unremarkable thyroid gland. UPPER CHEST: Right upper lobe nodule measures 9 mm (series 5, image 8). AORTIC ARCH: There is no calcific atherosclerosis of the aortic arch. There is no aneurysm, dissection or hemodynamically significant stenosis of the visualized portion of the aorta. Conventional 3 vessel aortic branching pattern. The visualized proximal subclavian arteries are widely patent. RIGHT CAROTID SYSTEM: Normal without aneurysm, dissection or stenosis. LEFT CAROTID SYSTEM: Normal without aneurysm, dissection or stenosis. VERTEBRAL ARTERIES: Left dominant  configuration. Both origins are clearly patent. There is no dissection, occlusion or flow-limiting stenosis to the skull base (V1-V3 segments). CTA HEAD FINDINGS POSTERIOR CIRCULATION: --Vertebral arteries: Normal V4 segments. --Inferior cerebellar arteries: Normal. --Basilar artery: Normal. --Superior cerebellar arteries: Normal. --Posterior cerebral arteries (PCA): Normal. ANTERIOR CIRCULATION: --Intracranial internal carotid arteries: Normal. --Anterior cerebral arteries (ACA): Normal. Both A1 segments are present. Patent anterior communicating artery (a-comm). --Middle cerebral arteries (MCA): Normal. VENOUS SINUSES: As permitted by contrast timing, patent. ANATOMIC VARIANTS: None Review of the MIP images confirms the above findings. IMPRESSION: 1. No emergent large vessel occlusion or high-grade stenosis of the intracranial or cervical arteries. 2. 9 mm right upper lobe pulmonary nodule. Consider one of the following in 3 months for both low-risk and high-risk individuals: (a) repeat chest CT, (b) follow-up PET-CT, or (c) tissue sampling. This recommendation follows the consensus statement: Guidelines for Management of Incidental Pulmonary Nodules Detected on CT Images: From the Fleischner Society 2017; Radiology 2017; 284:228-243. Electronically Signed   By: Ulyses Jarred M.D.   On: 09/16/2021 22:52   DG Chest 2 View  Result Date: 09/20/2021 CLINICAL DATA:  Chest pain EXAM: CHEST - 2 VIEW COMPARISON:  10/27/2008 FINDINGS: The heart size and mediastinal contours are within normal limits. Both lungs are clear. The visualized skeletal structures are unremarkable. IMPRESSION: No active cardiopulmonary disease. Electronically Signed   By: Donavan Foil M.D.   On: 09/20/2021 21:19   CT HEAD WO CONTRAST  Result Date: 09/16/2021 CLINICAL DATA:  Altered mental status. EXAM: CT HEAD WITHOUT CONTRAST TECHNIQUE: Contiguous axial images were obtained from the base of the skull through the vertex without intravenous  contrast. COMPARISON:  None. FINDINGS: Brain: Mild chronic ischemic white matter disease is noted. No mass effect or midline shift is noted. Ventricular size is within normal limits. There is no evidence of mass lesion, hemorrhage or acute infarction. Vascular: No hyperdense vessel or unexpected calcification. Skull: Normal. Negative for fracture or focal lesion. Sinuses/Orbits: No acute finding. Other: None. IMPRESSION: No acute intracranial abnormality seen. Electronically Signed   By: [name] M.D.   On: 09/16/2021 16:18   CT ANGIO NECK W OR WO CONTRAST  Result Date: 09/16/2021 CLINICAL DATA:  Dizziness EXAM: CT ANGIOGRAPHY HEAD AND NECK TECHNIQUE: Multidetector CT imaging of the head and neck was performed using the standard protocol during bolus administration of intravenous contrast. Multiplanar CT image reconstructions and MIPs were obtained to evaluate the vascular anatomy. Carotid stenosis measurements (when applicable) are obtained utilizing NASCET criteria, using the distal internal carotid diameter as the denominator. CONTRAST:  12mL OMNIPAQUE IOHEXOL 350 MG/ML SOLN COMPARISON:  None. FINDINGS: CTA NECK FINDINGS SKELETON: There is no bony spinal canal stenosis. No lytic or blastic lesion. OTHER NECK: Normal pharynx, larynx and major salivary glands. No cervical lymphadenopathy. Unremarkable thyroid gland. UPPER CHEST: Right upper lobe nodule measures 9 mm (series 5, image 8). AORTIC ARCH: There is no calcific atherosclerosis of the aortic arch. There is no aneurysm, dissection or hemodynamically significant stenosis of the visualized portion of the aorta. Conventional 3 vessel aortic branching pattern. The visualized proximal subclavian arteries are widely patent. RIGHT CAROTID SYSTEM: Normal without aneurysm, dissection or stenosis. LEFT CAROTID SYSTEM: Normal without aneurysm, dissection or stenosis. VERTEBRAL ARTERIES: Left dominant configuration. Both origins are clearly patent. There is  no dissection, occlusion or flow-limiting stenosis to the skull base (V1-V3 segments). CTA HEAD FINDINGS POSTERIOR CIRCULATION: --Vertebral arteries: Normal V4 segments. --Inferior cerebellar arteries: Normal. --Basilar artery: Normal. --Superior cerebellar arteries: Normal. --Posterior cerebral arteries (PCA): Normal. ANTERIOR CIRCULATION: --Intracranial internal carotid arteries: Normal. --Anterior cerebral arteries (ACA): Normal. Both A1 segments are present. Patent anterior communicating artery (a-comm). --Middle cerebral arteries (MCA): Normal. VENOUS SINUSES: As permitted by contrast timing, patent. ANATOMIC VARIANTS: None Review of the MIP images confirms the above findings. IMPRESSION: 1. No emergent large vessel occlusion or high-grade stenosis of the intracranial or cervical arteries. 2. 9 mm right upper lobe pulmonary nodule. Consider one of the following in 3 months for both low-risk and high-risk individuals: (a) repeat chest CT, (b) follow-up PET-CT, or (c) tissue sampling. This recommendation follows the consensus statement: Guidelines for Management of Incidental Pulmonary Nodules Detected on CT Images: From the Fleischner Society 2017; Radiology 2017; 284:228-243. Electronically Signed   By: Ulyses Jarred M.D.   On: 09/16/2021 22:52   MR Brain Wo Contrast (neuro protocol)  Result Date: 09/16/2021 CLINICAL DATA:  Dizziness EXAM: MRI HEAD WITHOUT CONTRAST TECHNIQUE: Multiplanar, multiecho pulse sequences of the brain and surrounding structures were obtained without intravenous contrast. COMPARISON:  No prior MRI, correlation is made with 09/16/2021 CT head FINDINGS: Brain: Multiple small  foci of restricted diffusion with ADC correlates in the cerebral cortex bilaterally, as well as cerebral white matter, right thalamus, and bilateral cerebellar hemispheres, likely acute infarcts, with additional increased signal on diffusion-weighted imaging without ADC correlate in the vermis and left aspect of  the splenium of the corpus callosum, likely subacute infarcts. No evidence of hemorrhage, mass, mass effect, or midline shift. Confluent T2 hyperintense signal in the periventricular white matter and pons, likely the sequela of severe chronic small vessel ischemic disease. Vascular: Normal flow voids. Skull and upper cervical spine: Normal marrow signal. Sinuses/Orbits: No acute finding. Status post bilateral lens replacements. Other: The mastoids are well aerated. IMPRESSION: Multiple small foci of restricted diffusion throughout the bilateral cerebral and cerebellar hemispheres, as well as the right thalamus, consistent with acute infarcts. Additional subacute infarcts in the vermis and left aspect of the splenium of the corpus callosum. Given different vascular territories, an embolic etiology is favored. These results were called by telephone at the time of interpretation on 09/16/2021 at 7:47 pm to provider HALEY SAGE , who verbally acknowledged these results. Electronically Signed   By: Merilyn Baba M.D.   On: 09/16/2021 19:48   MR LIVER W WO CONTRAST  Result Date: 09/06/2021 CLINICAL DATA:  Evaluate liver mass seen on recent abdominal sonogram. EXAM: MRI ABDOMEN WITHOUT AND WITH CONTRAST TECHNIQUE: Multiplanar multisequence MR imaging of the abdomen was performed both before and after the administration of intravenous contrast. CONTRAST:  5mL MULTIHANCE GADOBENATE DIMEGLUMINE 529 MG/ML IV SOLN COMPARISON:  08/05/2021 FINDINGS: Lower chest: No acute findings. Hepatobiliary: There are multifocal rim enhancing lesions identified throughout both lobes of liver which are worrisome for metastatic disease. Index lesion within lateral dome of right hepatic lobe measures 1.1 cm, image 26/16. Segment 4a lesion measures 1.6 by 1.4 cm, image 34/16. Segment 2 lesion measures 0.8 x 0.7 cm, image 34/16. Within the posteromedial margin of right hepatic lobe subcapsular lesion measures 2.5 x 2.0 cm, image 67/16. The  gallbladder is difficult to visualize being filled with stones and partially contracted. No intrahepatic bile duct dilatation. The common bile duct is upper limits of normal measuring 6 mm. Pancreas: No signs of pancreatic inflammation. The main pancreatic duct appears ectatic with scattered areas of side branch duct ectasia. Spleen:  Within normal limits in size and appearance. Adrenals/Urinary Tract: Normal adrenal glands. Right kidney cyst measures 7.0 x 4.2 cm, image 11/7. No internal septation or mural nodule. No suspicious kidney mass or hydronephrosis identified bilaterally Stomach/Bowel: Visualized portions within the abdomen are unremarkable. Vascular/Lymphatic: Normal appearance of the abdominal aorta. No adenopathy identified. Other:  None. Musculoskeletal: No suspicious bone lesions identified. IMPRESSION: 1. There are multifocal rim enhancing lesions identified throughout both lobes of liver which are worrisome for metastatic disease. Recommend correlation with tissue sampling. 2. Right kidney cyst. 3. The gallbladder is difficult to visualize being filled with stones and partially contracted. No intrahepatic bile duct dilatation. 4. The main pancreatic duct appears ectatic with scattered areas of side branch duct ectasia. Electronically Signed   By: Kerby Moors M.D.   On: 09/06/2021 08:15   CT CORONARY MORPH W/CTA COR W/SCORE W/CA W/CM &/OR WO/CM  Addendum Date: 09/22/2021   ADDENDUM REPORT: 09/22/2021 15:35 EXAM: OVER-READ INTERPRETATION  CT CHEST The following report is an over-read performed by radiologist Dr. Rebekah Chesterfield [facility] on 09/22/2021. This over-read does not include interpretation of cardiac or coronary anatomy or pathology. The coronary calcium score and cardiac CTA interpretation by the cardiologist  is attached. COMPARISON:  None. FINDINGS: Within the visualized portions of the thorax there are no suspicious appearing pulmonary nodules or masses, there is  no acute consolidative airspace disease, no pleural effusions, no pneumothorax and no lymphadenopathy. Visualized portions of the upper abdomen are unremarkable. There are no aggressive appearing lytic or blastic lesions noted in the visualized portions of the skeleton. IMPRESSION: 1. No significant incidental noncardiac findings are noted. Electronically Signed   By: Vinnie Langton M.D.   On: 09/22/2021 15:35   Result Date: 09/22/2021 CLINICAL DATA:  49M with chest pain EXAM: Cardiac/Coronary CTA TECHNIQUE: The patient was scanned on a [employer]. FINDINGS: A 100 kV prospective scan was triggered in the descending thoracic aorta at 111 HU's. Axial non-contrast 3 mm slices were carried out through the heart. The data set was analyzed on a dedicated work station and scored using the Clifford. Gantry rotation speed was 250 msecs and collimation was .6 mm. No beta blockade and 0.8 mg of sl NTG was given. The 3D data set was reconstructed in 5% intervals of the 67-82 % of the R-R cycle. Diastolic phases were analyzed on a dedicated work station using MPR, MIP and VRT modes. The patient received 80 cc of contrast. Coronary Arteries:  Normal coronary origin.  Right dominance. RCA is a large dominant artery that gives rise to PDA and PLA. There is no plaque. Left main is a large artery that gives rise to LAD and LCX arteries. LAD is a large vessel that has no plaque. LCX is a non-dominant artery that gives rise to one large OM1 branch. There is no plaque. There is a ramus with no plaque. Other findings: Left Ventricle: Normal size. PFO Left Atrium: Mild enlargement Pulmonary Veins: Normal configuration Right Ventricle: Mild enlargement Right Atrium: Mild enlargement Cardiac valves: No calcifications Thoracic aorta: Mild dilatation ascending aorta measuring 98mm Pulmonary Arteries: Normal size Systemic Veins: Normal drainage Pericardium: Normal thickness IMPRESSION: 1. Coronary calcium score of 0. 2.  Normal coronary origin with right dominance. 3. No evidence of CAD. CAD-RADS 0. No evidence of CAD (0%). Consider non-atherosclerotic causes of chest pain. Electronically Signed: By: Oswaldo Milian M.D. On: 09/22/2021 14:57   VAS [place] TRANSCRANIAL DOPPLER W BUBBLES  Result Date: 09/20/2021  Transcranial Doppler with Bubble Patient Name:  KINGSTIN HEIMS  Date of Exam:   09/19/2021 Medical Rec #: [id number]        Accession #:    [id number] Date of Birth: May 07, 1941        Patient Gender: M Patient Age:   6 years Exam Location:  [facility] Procedure:      VAS [place] TRANSCRANIAL DOPPLER W BUBBLES Referring Phys: Elwin Sleight DE LA TORRE --------------------------------------------------------------------------------  Indications: Stroke. Comparison Study: 09-17-2021 Echo complete w/ bubble was positive for                   intracardiac shunting. Performing Technologist: Darlin Coco RDMS, RVT  Examination Guidelines: A complete evaluation includes B-mode imaging, spectral Doppler, color Doppler, and power Doppler as needed of all accessible portions of each vessel. Bilateral testing is considered an integral part of a complete examination. Limited examinations for reoccurring indications may be performed as noted.  Summary:  A vascular evaluation was performed. The right middle cerebral artery was studied. An IV was inserted into the patient's left forearm. Verbal informed consent was obtained.  Between 15 and 30 high intensity transient signals (HITS) were observed at rest, and between 30  and 100 were observed with valsalva, indicating a Spencer grade 3 patent foramen ovale (PFO). Positive TCD Bubble study indicative of a medium size right to left shunt *See table(s) above for TCD measurements and observations.  Diagnosing physician: Antony Contras MD Electronically signed by Antony Contras MD on 09/20/2021 at 5:55:43 PM.    Final    ECHOCARDIOGRAM COMPLETE BUBBLE STUDY  Result Date: 09/17/2021     ECHOCARDIOGRAM REPORT   Patient Name:   ALVEN ALVERIO Date of Exam: 09/17/2021 Medical Rec #:  [id number]       Height:       70.0 in Accession #:    [id number]      Weight:       200.0 lb Date of Birth:  1941/05/05       BSA:          2.087 m Patient Age:    26 years        BP:           133/95 mmHg Patient Gender: M               HR:           67 bpm. Exam Location:  Inpatient Procedure: 2D Echo, Cardiac Doppler, Color Doppler and Intracardiac            Opacification Agent Indications:    Stroke 434.91 / I63.9  History:        Patient has no prior history of Echocardiogram examinations.                 Risk Factors:Hypertension, Diabetes and Dyslipidemia.  Sonographer:    Darlina Sicilian RDCS Referring Phys: [id number] Home  1. Left ventricular ejection fraction, by estimation, is 60 to 65%. The left ventricle has normal function. The left ventricle has no regional wall motion abnormalities. There is mild left ventricular hypertrophy. Left ventricular diastolic parameters are consistent with Grade I diastolic dysfunction (impaired relaxation).  2. Right ventricular systolic function is normal. The right ventricular size is normal.  3. The mitral valve is normal in structure. No evidence of mitral valve regurgitation. No evidence of mitral stenosis.  4. The aortic valve is tricuspid. Aortic valve regurgitation is not visualized. No aortic stenosis is present.  5. Aortic dilatation noted. There is mild dilatation of the ascending aorta, measuring 38 mm.  6. Agitated saline contrast bubble study was positive with shunting observed within 3-6 cardiac cycles suggestive of interatrial shunt. FINDINGS  Left Ventricle: Left ventricular ejection fraction, by estimation, is 60 to 65%. The left ventricle has normal function. The left ventricle has no regional wall motion abnormalities. Definity contrast agent was given IV to delineate the left ventricular  endocardial borders. The left ventricular  internal cavity size was normal in size. There is mild left ventricular hypertrophy. Left ventricular diastolic parameters are consistent with Grade I diastolic dysfunction (impaired relaxation). Normal left ventricular filling pressure. Right Ventricle: The right ventricular size is normal. No increase in right ventricular wall thickness. Right ventricular systolic function is normal. Left Atrium: Left atrial size was normal in size. Right Atrium: Right atrial size was normal in size. Pericardium: There is no evidence of pericardial effusion. Mitral Valve: The mitral valve is normal in structure. No evidence of mitral valve regurgitation. No evidence of mitral valve stenosis. Tricuspid Valve: The tricuspid valve is normal in structure. Tricuspid valve regurgitation is not demonstrated. No evidence of tricuspid stenosis. Aortic Valve: The aortic valve is  tricuspid. Aortic valve regurgitation is not visualized. No aortic stenosis is present. Aortic valve mean gradient measures 2.6 mmHg. Aortic valve peak gradient measures 5.5 mmHg. Aortic valve area, by VTI measures 3.14 cm. Pulmonic Valve: The pulmonic valve was not well visualized. Pulmonic valve regurgitation is not visualized. No evidence of pulmonic stenosis. Aorta: The aortic root is normal in size and structure and aortic dilatation noted. There is mild dilatation of the ascending aorta, measuring 38 mm. IAS/Shunts: The interatrial septum was not well visualized. Agitated saline contrast was given intravenously to evaluate for intracardiac shunting. Agitated saline contrast bubble study was positive with shunting observed within 3-6 cardiac cycles suggestive of interatrial shunt.  LEFT VENTRICLE PLAX 2D LVIDd:         4.00 cm   Diastology LVIDs:         2.90 cm   LV e' medial:    4.68 cm/s LV PW:         1.00 cm   LV E/e' medial:  12.0 LV IVS:        1.20 cm   LV e' lateral:   5.77 cm/s LVOT diam:     2.40 cm   LV E/e' lateral: 9.8 LV SV:         74 LV SV  Index:   35 LVOT Area:     4.52 cm  RIGHT VENTRICLE RV S prime:     12.80 cm/s LEFT ATRIUM             Index       RIGHT ATRIUM          Index LA diam:        3.10 cm 1.49 cm/m  RA Area:     7.64 cm LA Vol (A2C):   15.3 ml 7.33 ml/m  RA Volume:   10.60 ml 5.08 ml/m LA Vol (A4C):   15.2 ml 7.28 ml/m LA Biplane Vol: 16.2 ml 7.76 ml/m  AORTIC VALVE AV Area (Vmax):    3.30 cm AV Area (Vmean):   3.23 cm AV Area (VTI):     3.14 cm AV Vmax:           117.28 cm/s AV Vmean:          74.266 cm/s AV VTI:            0.235 m AV Peak Grad:      5.5 mmHg AV Mean Grad:      2.6 mmHg LVOT Vmax:         85.52 cm/s LVOT Vmean:        53.105 cm/s LVOT VTI:          0.163 m LVOT/AV VTI ratio: 0.69  AORTA Ao Root diam: 3.80 cm Ao Asc diam:  3.80 cm MITRAL VALVE MV Area (PHT): 3.23 cm    SHUNTS MV Decel Time: 235 msec    Systemic VTI:  0.16 m MV E velocity: 56.30 cm/s  Systemic Diam: 2.40 cm MV A velocity: 99.40 cm/s MV E/A ratio:  0.57 Carlyle Dolly MD Electronically signed by Carlyle Dolly MD Signature Date/Time: 09/17/2021/11:35:46 AM    Final    VAS [place] LOWER EXTREMITY VENOUS (DVT)  Result Date: 09/18/2021  Lower Venous DVT Study Patient Name:  DENCIL CAYSON  Date of Exam:   09/18/2021 Medical Rec #: [id number]        Accession #:    [id number] Date of Birth: 01/26/1941        Patient Gender:  M Patient Age:   50 years Exam Location:  [facility] Procedure:      VAS [place] LOWER EXTREMITY VENOUS (DVT) Referring Phys: JEFFREY MCCLUNG --------------------------------------------------------------------------------  Indications: Stroke.  Comparison Study: No prior study on file Performing Technologist: Sharion Dove RVS  Examination Guidelines: A complete evaluation includes B-mode imaging, spectral Doppler, color Doppler, and power Doppler as needed of all accessible portions of each vessel. Bilateral testing is considered an integral part of a complete examination. Limited examinations for reoccurring indications  may be performed as noted. The reflux portion of the exam is performed with the patient in reverse Trendelenburg.  +---------+---------------+---------+-----------+----------+--------------+ RIGHT    CompressibilityPhasicitySpontaneityPropertiesThrombus Aging +---------+---------------+---------+-----------+----------+--------------+ CFV      Full           Yes      Yes                                 +---------+---------------+---------+-----------+----------+--------------+ SFJ      Full                                                        +---------+---------------+---------+-----------+----------+--------------+ FV Prox  Full                                                        +---------+---------------+---------+-----------+----------+--------------+ FV Mid   Full                                                        +---------+---------------+---------+-----------+----------+--------------+ FV DistalFull                                                        +---------+---------------+---------+-----------+----------+--------------+ PFV      Full                                                        +---------+---------------+---------+-----------+----------+--------------+ POP      Full           Yes      Yes                                 +---------+---------------+---------+-----------+----------+--------------+ PTV      Full                                                        +---------+---------------+---------+-----------+----------+--------------+ PERO  Full                                                        +---------+---------------+---------+-----------+----------+--------------+   +---------+---------------+---------+-----------+----------+--------------+ LEFT     CompressibilityPhasicitySpontaneityPropertiesThrombus Aging +---------+---------------+---------+-----------+----------+--------------+ CFV       Full           Yes      Yes                                 +---------+---------------+---------+-----------+----------+--------------+ SFJ      Full                                                        +---------+---------------+---------+-----------+----------+--------------+ FV Prox  Full                                                        +---------+---------------+---------+-----------+----------+--------------+ FV Mid   Full                                                        +---------+---------------+---------+-----------+----------+--------------+ FV DistalFull                                                        +---------+---------------+---------+-----------+----------+--------------+ PFV      Full                                                        +---------+---------------+---------+-----------+----------+--------------+ POP      Full           No       Yes                                 +---------+---------------+---------+-----------+----------+--------------+ PTV      Full                                                        +---------+---------------+---------+-----------+----------+--------------+ PERO     Full                                                        +---------+---------------+---------+-----------+----------+--------------+  Summary: BILATERAL: - No evidence of deep vein thrombosis seen in the lower extremities, bilaterally. Bilateral popliteal, posterior tibial, and peroneal veins are dilated with rouleaux flow, but are easily compressible. -No evidence of popliteal cyst, bilaterally.   *See table(s) above for measurements and observations. Electronically signed by Monica Martinez MD on 09/18/2021 at 11:16:48 AM.    Final     Labs: BNP (last 3 results) No results for input(s): BNP in the last 8760 hours. Basic Metabolic Panel: Recent Labs  Lab 09/18/21 0020 09/20/21 2050 09/21/21 0454  09/22/21 0700 09/23/21 1118  NA 136 134* 135 135 135  K 3.7 4.2 4.3 4.4 4.2  CL 104 97* 101 103 103  CO2 27 24 26 26 25   GLUCOSE 130* 239* 140* 167* 131*  BUN 13 21 18 15 22   CREATININE 1.28* 1.32* 1.17 1.12 1.28*  CALCIUM 9.0 8.9 9.0 9.0 9.1  MG 1.9  --   --   --   --    Liver Function Tests: Recent Labs  Lab 09/16/21 1607 09/18/21 0020 09/23/21 1118  AST 28 29 40  ALT 19 18 36  ALKPHOS 69 68 80  BILITOT 0.7 0.9 1.0  PROT 7.0 6.3* 7.3  ALBUMIN 3.5 3.2* 3.6   No results for input(s): LIPASE, AMYLASE in the last 168 hours. Recent Labs  Lab 09/16/21 1903  AMMONIA 23   CBC: Recent Labs  Lab 09/16/21 1607 09/16/21 1617 09/17/21 0500 09/18/21 0020 09/20/21 2050 09/21/21 0454 09/23/21 1118  WBC 4.2  --  4.0 4.9 5.9 5.1 5.0  NEUTROABS 2.1  --  2.5  --   --   --   --   HGB 13.5   < > 14.2 13.6 13.7 13.1 13.7  HCT 41.4   < > 42.3 41.4 41.8 40.3 41.7  MCV 86.8  --  85.6 85.0 85.3 85.7 85.6  PLT 162  --  198 177 187 195 181   < > = values in this interval not displayed.   Cardiac Enzymes: No results for input(s): CKTOTAL, CKMB, CKMBINDEX, TROPONINI in the last 168 hours. BNP: Invalid input(s): POCBNP CBG: Recent Labs  Lab 09/19/21 1641 09/22/21 0040 09/22/21 0829 09/22/21 1135 09/23/21 1057  GLUCAP 133* 187* 150* 141* 130*   D-Dimer No results for input(s): DDIMER in the last 72 hours. Hgb A1c No results for input(s): HGBA1C in the last 72 hours. Lipid Profile No results for input(s): CHOL, HDL, LDLCALC, TRIG, CHOLHDL, LDLDIRECT in the last 72 hours. Thyroid function studies No results for input(s): TSH, T4TOTAL, T3FREE, THYROIDAB in the last 72 hours.  Invalid input(s): FREET3 Anemia work up No results for input(s): VITAMINB12, FOLATE, FERRITIN, TIBC, IRON, RETICCTPCT in the last 72 hours. Urinalysis    Component Value Date/Time   COLORURINE STRAW (A) 09/16/2021 2140   APPEARANCEUR CLEAR 09/16/2021 2140   LABSPEC 1.009 09/16/2021 2140   PHURINE  7.0 09/16/2021 2140   GLUCOSEU NEGATIVE 09/16/2021 2140   HGBUR SMALL (A) 09/16/2021 2140   HGBUR negative 06/15/2008 0000   BILIRUBINUR NEGATIVE 09/16/2021 2140   KETONESUR NEGATIVE 09/16/2021 2140   PROTEINUR NEGATIVE 09/16/2021 2140   UROBILINOGEN 0.2 06/15/2008 0000   NITRITE NEGATIVE 09/16/2021 2140   LEUKOCYTESUR NEGATIVE 09/16/2021 2140   Sepsis Labs Invalid input(s): PROCALCITONIN,  WBC,  LACTICIDVEN Microbiology Recent Results (from the past 240 hour(s))  Resp Panel by RT-PCR (Flu A&B, Covid) Nasopharyngeal Swab     Status: None   Collection Time: 09/16/21  6:00 PM  Specimen: Nasopharyngeal Swab; Nasopharyngeal(NP) swabs in vial transport medium  Result Value Ref Range Status   SARS Coronavirus 2 by RT PCR NEGATIVE NEGATIVE Final    Comment: (NOTE) SARS-CoV-2 target nucleic acids are NOT DETECTED.  The SARS-CoV-2 RNA is generally detectable in upper respiratory specimens during the acute phase of infection. The lowest concentration of SARS-CoV-2 viral copies this assay can detect is 138 copies/mL. A negative result does not preclude SARS-Cov-2 infection and should not be used as the sole basis for treatment or other patient management decisions. A negative result may occur with  improper specimen collection/handling, submission of specimen other than nasopharyngeal swab, presence of viral mutation(s) within the areas targeted by this assay, and inadequate number of viral copies(<138 copies/mL). A negative result must be combined with clinical observations, patient history, and epidemiological information. The expected result is Negative.  Fact Sheet for Patients:  EntrepreneurPulse.com.au  Fact Sheet for Healthcare Providers:  IncredibleEmployment.be  This test is no t yet approved or cleared by the [place] FDA and  has been authorized for detection and/or diagnosis of SARS-CoV-2 by FDA under an Emergency Use Authorization  (EUA). This EUA will remain  in effect (meaning this test can be used) for the duration of the COVID-19 declaration under Section 564(b)(1) of the Act, 21 U.S.C.section 360bbb-3(b)(1), unless the authorization is terminated  or revoked sooner.       Influenza A by PCR NEGATIVE NEGATIVE Final   Influenza B by PCR NEGATIVE NEGATIVE Final    Comment: (NOTE) The Xpert Xpress SARS-CoV-2/FLU/RSV plus assay is intended as an aid in the diagnosis of influenza from Nasopharyngeal swab specimens and should not be used as a sole basis for treatment. Nasal washings and aspirates are unacceptable for Xpert Xpress SARS-CoV-2/FLU/RSV testing.  Fact Sheet for Patients: EntrepreneurPulse.com.au  Fact Sheet for Healthcare Providers: IncredibleEmployment.be  This test is not yet approved or cleared by the [place] FDA and has been authorized for detection and/or diagnosis of SARS-CoV-2 by FDA under an Emergency Use Authorization (EUA). This EUA will remain in effect (meaning this test can be used) for the duration of the COVID-19 declaration under Section 564(b)(1) of the Act, 21 U.S.C. section 360bbb-3(b)(1), unless the authorization is terminated or revoked.  Performed at [facility], [place] [address], [place], [place] [address]   Resp Panel by RT-PCR (Flu A&B, Covid) Nasopharyngeal Swab     Status: None   Collection Time: 09/21/21  2:13 AM   Specimen: Nasopharyngeal Swab; Nasopharyngeal(NP) swabs in vial transport medium  Result Value Ref Range Status   SARS Coronavirus 2 by RT PCR NEGATIVE NEGATIVE Final    Comment: (NOTE) SARS-CoV-2 target nucleic acids are NOT DETECTED.  The SARS-CoV-2 RNA is generally detectable in upper respiratory specimens during the acute phase of infection. The lowest concentration of SARS-CoV-2 viral copies this assay can detect is 138 copies/mL. A negative result does not preclude SARS-Cov-2 infection  and should not be used as the sole basis for treatment or other patient management decisions. A negative result may occur with  improper specimen collection/handling, submission of specimen other than nasopharyngeal swab, presence of viral mutation(s) within the areas targeted by this assay, and inadequate number of viral copies(<138 copies/mL). A negative result must be combined with clinical observations, patient history, and epidemiological information. The expected result is Negative.  Fact Sheet for Patients:  EntrepreneurPulse.com.au  Fact Sheet for Healthcare Providers:  IncredibleEmployment.be  This test is no t yet approved or cleared by  the [employer] and  has been authorized for detection and/or diagnosis of SARS-CoV-2 by FDA under an Emergency Use Authorization (EUA). This EUA will remain  in effect (meaning this test can be used) for the duration of the COVID-19 declaration under Section 564(b)(1) of the Act, 21 U.S.C.section 360bbb-3(b)(1), unless the authorization is terminated  or revoked sooner.       Influenza A by PCR NEGATIVE NEGATIVE Final   Influenza B by PCR NEGATIVE NEGATIVE Final    Comment: (NOTE) The Xpert Xpress SARS-CoV-2/FLU/RSV plus assay is intended as an aid in the diagnosis of influenza from Nasopharyngeal swab specimens and should not be used as a sole basis for treatment. Nasal washings and aspirates are unacceptable for Xpert Xpress SARS-CoV-2/FLU/RSV testing.  Fact Sheet for Patients: EntrepreneurPulse.com.au  Fact Sheet for Healthcare Providers: IncredibleEmployment.be  This test is not yet approved or cleared by the [place] FDA and has been authorized for detection and/or diagnosis of SARS-CoV-2 by FDA under an Emergency Use Authorization (EUA). This EUA will remain in effect (meaning this test can be used) for the duration of the COVID-19 declaration  under Section 564(b)(1) of the Act, 21 U.S.C. section 360bbb-3(b)(1), unless the authorization is terminated or revoked.  Performed at [facility], [place] [address], [place], [place] [address]      Time coordinating discharge: 25 minutes  SIGNED: Antonieta Pert, MD  [facility] 09/23/2021, 12:10 PM  If 7PM-7AM, please contact night-coverage [email or web address]

## 2021-09-22 NOTE — Plan of Care (Signed)

## 2021-09-22 NOTE — Progress Notes (Signed)
Progress Note  Patient Name: Robert Warner Date of Encounter: 09/22/2021  Primary Cardiologist:   None   Subjective   He is having abdominal pain but no chest pain.  No SOB.    Inpatient Medications    Scheduled Meds:  atorvastatin  40 mg Oral Daily   influenza vaccine adjuvanted  0.5 mL Intramuscular Tomorrow-1000   insulin aspart  0-15 Units Subcutaneous TID WC   insulin aspart  0-5 Units Subcutaneous QHS   latanoprost  1 drop Both Eyes QHS   nitroGLYCERIN       sodium chloride flush  3 mL Intravenous Q12H   tamsulosin  0.4 mg Oral Daily   Continuous Infusions:  sodium chloride     PRN Meds: sodium chloride, acetaminophen **OR** acetaminophen, hydrocortisone cream, nitroGLYCERIN, sodium chloride flush   Vital Signs    Vitals:   09/22/21 0903 09/22/21 1049 09/22/21 1119 09/22/21 1135  BP:  (!) 124/93 127/83   Pulse:    60  Resp: 18 18  16   Temp:    98.8 F (37.1 C)  TempSrc:    Oral  SpO2:    97%  Weight:      Height:       No intake or output data in the 24 hours ending 09/22/21 1330 Filed Weights   09/21/21 0416  Weight: 88.3 kg    Telemetry    NSR - Personally Reviewed  ECG    NA - Personally Reviewed  Physical Exam   GEN: No acute distress.   Neck: No  JVD Cardiac: RRR, no murmurs, rubs, or gallops.  Respiratory: Clear  to auscultation bilaterally. GI: Soft, nontender, non-distended  MS: No  edema; No deformity. Neuro:  Nonfocal  Psych: Normal affect   Labs    Chemistry Recent Labs  Lab 09/16/21 1607 09/16/21 1617 09/18/21 0020 09/20/21 2050 09/21/21 0454 09/22/21 0700  NA 138   < > 136 134* 135 135  K 5.0   < > 3.7 4.2 4.3 4.4  CL 104   < > 104 97* 101 103  CO2 28  --  27 24 26 26   GLUCOSE 162*   < > 130* 239* 140* 167*  BUN 19   < > 13 21 18 15   CREATININE 1.05   < > 1.28* 1.32* 1.17 1.12  CALCIUM 9.2  --  9.0 8.9 9.0 9.0  PROT 7.0  --  6.3*  --   --   --   ALBUMIN 3.5  --  3.2*  --   --   --   AST 28  --  29  --    --   --   ALT 19  --  18  --   --   --   ALKPHOS 69  --  68  --   --   --   BILITOT 0.7  --  0.9  --   --   --   GFRNONAA >60  --  56* 54* >60 >60  ANIONGAP 6  --  5 13 8 6    < > = values in this interval not displayed.     Hematology Recent Labs  Lab 09/18/21 0020 09/20/21 2050 09/21/21 0454  WBC 4.9 5.9 5.1  RBC 4.87 4.90 4.70  HGB 13.6 13.7 13.1  HCT 41.4 41.8 40.3  MCV 85.0 85.3 85.7  MCH 27.9 28.0 27.9  MCHC 32.9 32.8 32.5  RDW 12.8 12.9 12.9  PLT 177 187 195  Cardiac EnzymesNo results for input(s): TROPONINI in the last 168 hours. No results for input(s): TROPIPOC in the last 168 hours.   BNPNo results for input(s): BNP, PROBNP in the last 168 hours.   DDimer No results for input(s): DDIMER in the last 168 hours.   Radiology    DG Chest 2 View  Result Date: 09/20/2021 CLINICAL DATA:  Chest pain EXAM: CHEST - 2 VIEW COMPARISON:  10/27/2008 FINDINGS: The heart size and mediastinal contours are within normal limits. Both lungs are clear. The visualized skeletal structures are unremarkable. IMPRESSION: No active cardiopulmonary disease. Electronically Signed   By: Donavan Foil M.D.   On: 09/20/2021 21:19    Cardiac Studies   CT:  0 calcium.  No CAD  Patient Profile     80 y.o. male without prior cardiac history who we are asked by Dr. Lupita Leash for evaluation of chest pain.   Assessment & Plan    CHEST PAIN:    Non anginal.  No further cardiac work up.  CT as above.      HTN:   BP is somewhat labile.  Follow at home.  No change in therapy.   DYSLIPIDEMIA:   LDL was mildly elevated.  In this situation I think the goal LDL is less than 100 because of the diabetes so I think Pravachol 40 mg as he was on is fine.  I discontinued the Lipitor and please resume Pravachol at discharge.    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  Restart Plavix and aspirin when able after the biopsy.    DM: A1c of 6.9.  Per the primary team.  No cardiology  follow up needed.   For questions or updates, please contact [facility] Please consult [email or web address] for contact info under Cardiology/STEMI.   Signed, Minus Breeding, MD  09/22/2021, 1:30 PM

## 2021-09-23 ENCOUNTER — Telehealth: Payer: Self-pay | Admitting: Family Medicine

## 2021-09-23 ENCOUNTER — Encounter (HOSPITAL_COMMUNITY): Payer: Self-pay

## 2021-09-23 ENCOUNTER — Other Ambulatory Visit: Payer: Self-pay

## 2021-09-23 ENCOUNTER — Ambulatory Visit (HOSPITAL_COMMUNITY)
Admit: 2021-09-23 | Discharge: 2021-09-23 | Disposition: A | Discharge status: Home / Self Care | Payer: Medicare HMO | Attending: Family Medicine | Admitting: Family Medicine

## 2021-09-23 DIAGNOSIS — C229 Malignant neoplasm of liver, not specified as primary or secondary: Secondary | ICD-10-CM | POA: Diagnosis not present

## 2021-09-23 DIAGNOSIS — R160 Hepatomegaly, not elsewhere classified: Secondary | ICD-10-CM

## 2021-09-23 DIAGNOSIS — Z8673 Personal history of transient ischemic attack (TIA), and cerebral infarction without residual deficits: Secondary | ICD-10-CM | POA: Insufficient documentation

## 2021-09-23 DIAGNOSIS — I639 Cerebral infarction, unspecified: Secondary | ICD-10-CM

## 2021-09-23 DIAGNOSIS — Z7902 Long term (current) use of antithrombotics/antiplatelets: Secondary | ICD-10-CM | POA: Insufficient documentation

## 2021-09-23 DIAGNOSIS — E119 Type 2 diabetes mellitus without complications: Secondary | ICD-10-CM | POA: Diagnosis not present

## 2021-09-23 DIAGNOSIS — C227 Other specified carcinomas of liver: Secondary | ICD-10-CM | POA: Diagnosis not present

## 2021-09-23 DIAGNOSIS — E785 Hyperlipidemia, unspecified: Secondary | ICD-10-CM | POA: Diagnosis not present

## 2021-09-23 DIAGNOSIS — Z7982 Long term (current) use of aspirin: Secondary | ICD-10-CM | POA: Diagnosis not present

## 2021-09-23 DIAGNOSIS — K7689 Other specified diseases of liver: Secondary | ICD-10-CM | POA: Diagnosis not present

## 2021-09-23 DIAGNOSIS — I10 Essential (primary) hypertension: Secondary | ICD-10-CM | POA: Insufficient documentation

## 2021-09-23 DIAGNOSIS — K769 Liver disease, unspecified: Secondary | ICD-10-CM | POA: Diagnosis present

## 2021-09-23 LAB — PROTIME-INR
INR: 1.1 (ref 0.8–1.2)
Prothrombin Time: 13.8 seconds (ref 11.4–15.2)

## 2021-09-23 LAB — COMPREHENSIVE METABOLIC PANEL
ALT: 36 U/L (ref 0–44)
AST: 40 U/L (ref 15–41)
Albumin: 3.6 g/dL (ref 3.5–5.0)
Alkaline Phosphatase: 80 U/L (ref 38–126)
Anion gap: 7 (ref 5–15)
BUN: 22 mg/dL (ref 8–23)
CO2: 25 mmol/L (ref 22–32)
Calcium: 9.1 mg/dL (ref 8.9–10.3)
Chloride: 103 mmol/L (ref 98–111)
Creatinine, Ser: 1.28 mg/dL — ABNORMAL HIGH (ref 0.61–1.24)
GFR, Estimated: 56 mL/min — ABNORMAL LOW (ref >60)
Glucose, Bld: 131 mg/dL — ABNORMAL HIGH (ref 70–99)
Potassium: 4.2 mmol/L (ref 3.5–5.1)
Sodium: 135 mmol/L (ref 135–145)
Total Bilirubin: 1 mg/dL (ref 0.3–1.2)
Total Protein: 7.3 g/dL (ref 6.5–8.1)

## 2021-09-23 LAB — CBC
HCT: 41.7 % (ref 39.0–52.0)
Hemoglobin: 13.7 g/dL (ref 13.0–17.0)
MCH: 28.1 pg (ref 26.0–34.0)
MCHC: 32.9 g/dL (ref 30.0–36.0)
MCV: 85.6 fL (ref 80.0–100.0)
Platelets: 181 10*3/uL (ref 150–400)
RBC: 4.87 MIL/uL (ref 4.22–5.81)
RDW: 12.9 % (ref 11.5–15.5)
WBC: 5 10*3/uL (ref 4.0–10.5)
nRBC: 0 % (ref 0.0–0.2)

## 2021-09-23 LAB — GLUCOSE, CAPILLARY: Glucose-Capillary: 130 mg/dL — ABNORMAL HIGH (ref 70–99)

## 2021-09-23 IMAGING — US US BIOPSY CORE LIVER
1 series · 13 of 13 positions shown · non-contrast
Comparison: none

INDICATION: Multiple liver masses

[Series 1: us core biopsy (liver) mc & wl · 13 of 13 slices shown]
[im 1/13]
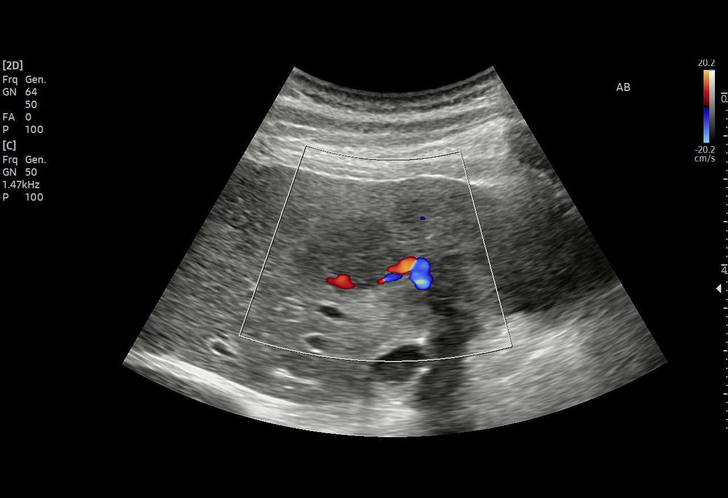
[im 2/13]
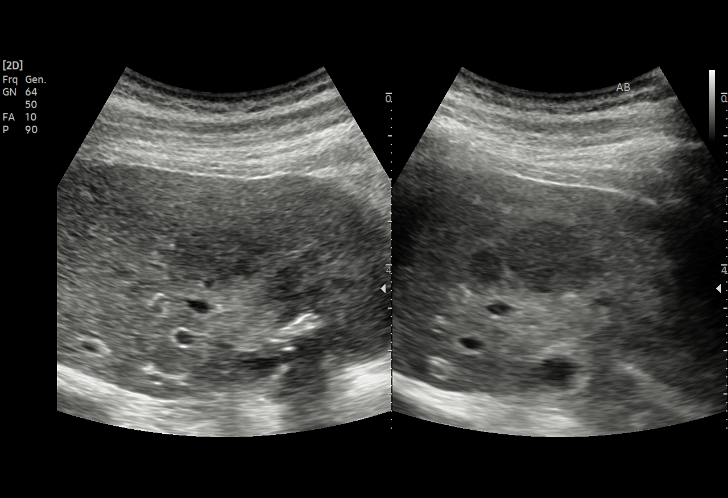
[im 3/13]
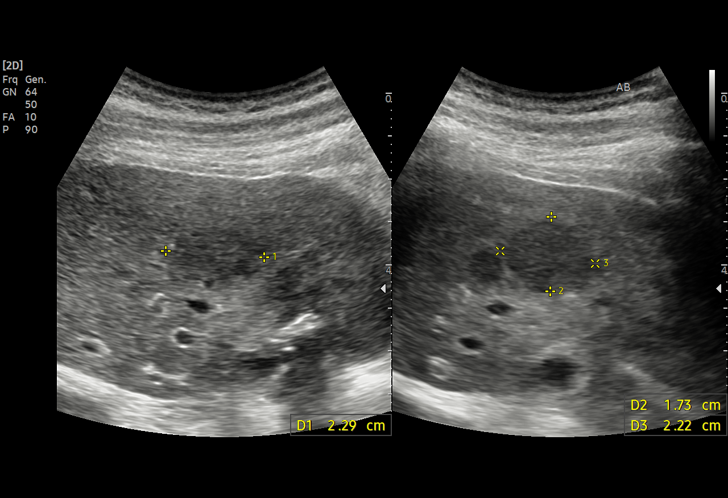
[im 4/13]
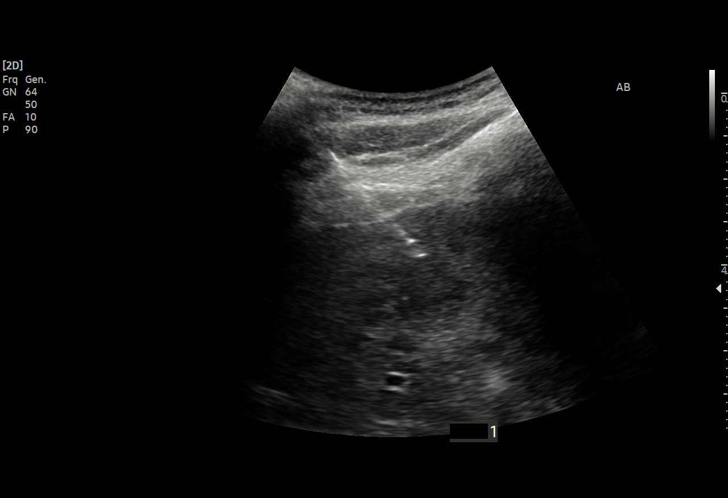
[im 5/13]
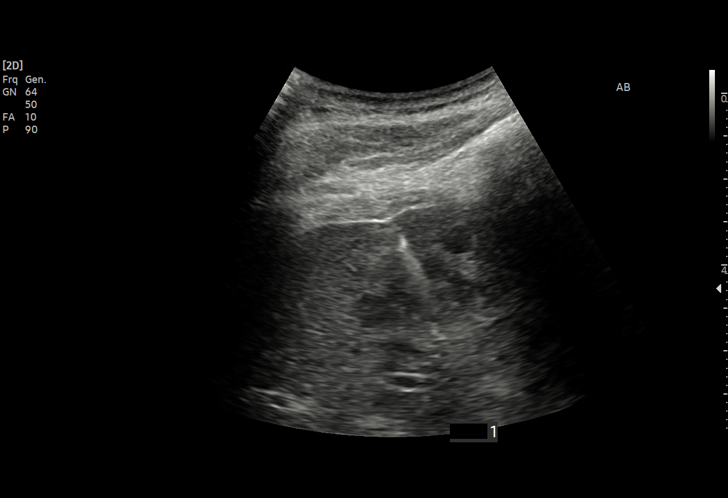
[im 6/13]
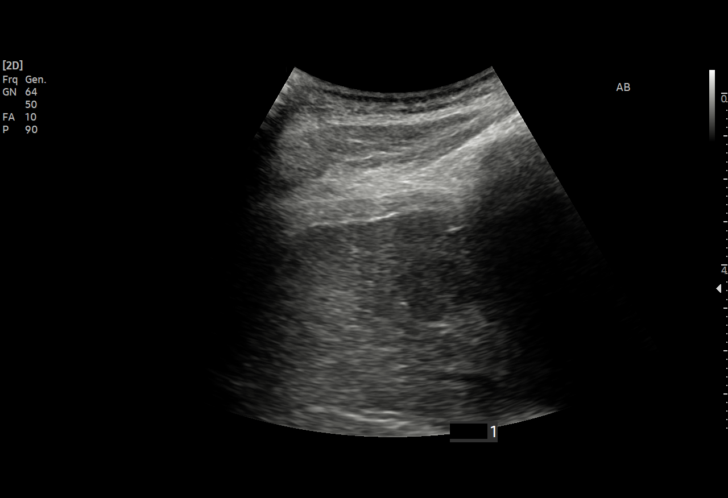
[im 7/13]
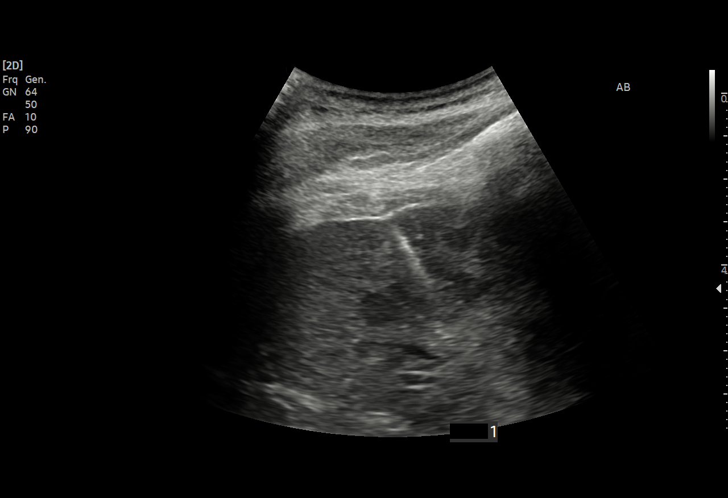
[im 8/13]
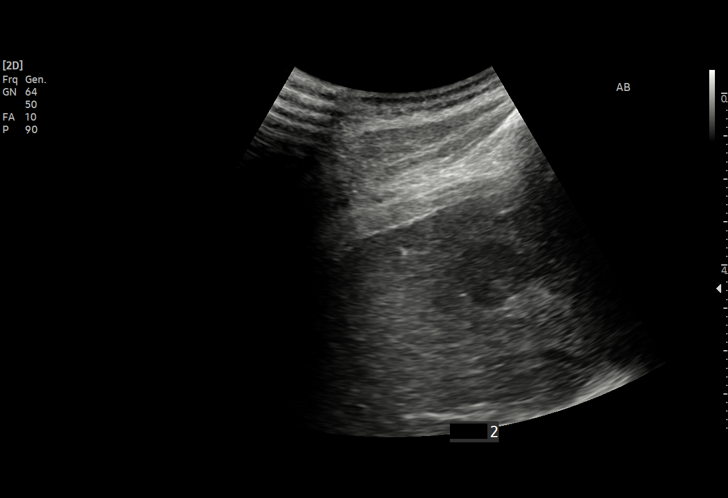
[im 9/13]
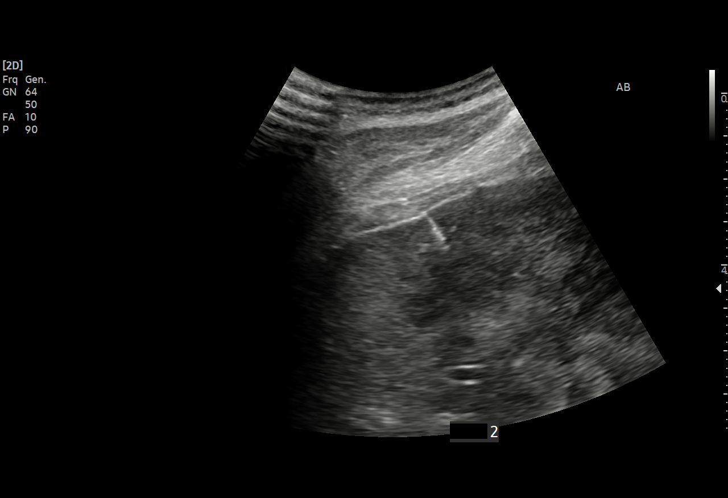
[im 10/13]
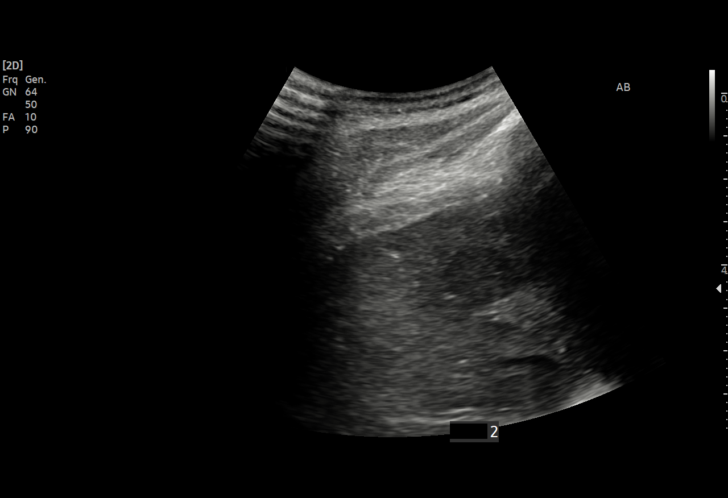
[im 11/13]
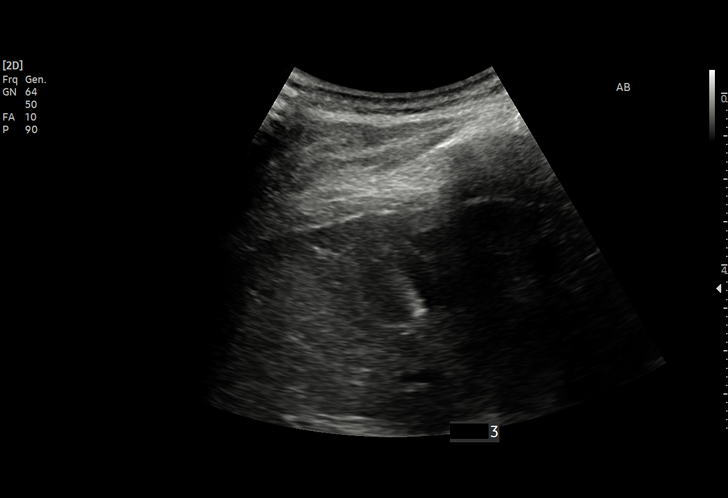
[im 12/13]
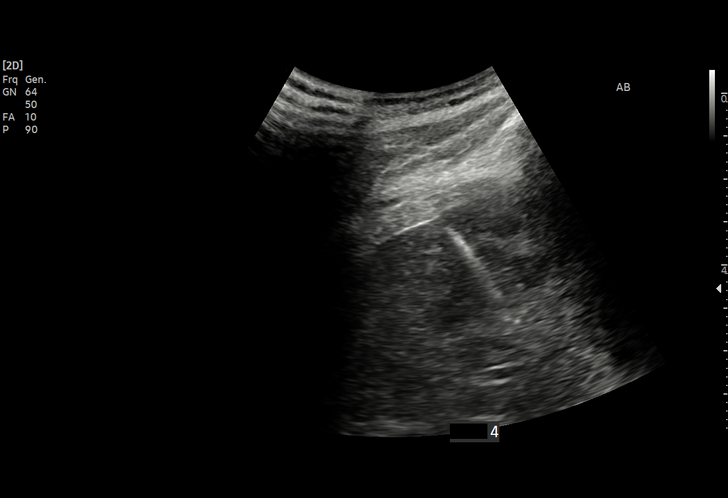
[im 13/13]
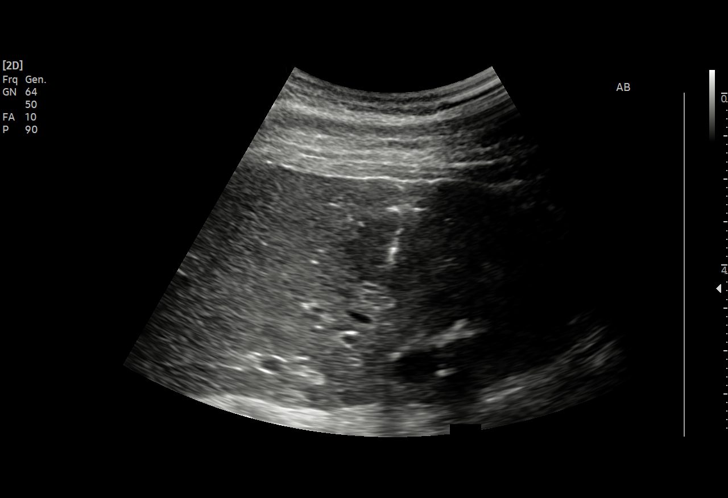

[13 of 13 positions shown; findings below may reference images not displayed]

EXAM:
Ultrasound-guided right liver mass biopsy

MEDICATIONS:
None.

ANESTHESIA/SEDATION:
Moderate (conscious) sedation was employed during this procedure. A
total of Versed 0.5 mg and Fentanyl 50 mcg was administered
intravenously.

Moderate Sedation Time: 16 minutes. The patient's level of
consciousness and vital signs were monitored continuously by
radiology nursing throughout the procedure under my direct
supervision.

COMPLICATIONS:
None immediate.

PROCEDURE:
Informed written consent was obtained from the patient after a
thorough discussion of the procedural risks, benefits and
alternatives. All questions were addressed. Maximal Sterile Barrier
Technique was utilized including caps, mask, sterile gowns, sterile
gloves, sterile drape, hand hygiene and skin antiseptic. A timeout
was performed prior to the initiation of the procedure.

Patient position supine on the ultrasound table.

Right upper quadrant skin prepped and draped in usual sterile
fashion.

Following local lidocaine administration, 17 gauge introducer needle
was advanced into the right hepatic lobe, and four 18 gauge cores
were obtained utilizing continuous ultrasound guidance.

Gelfoam slurry was administered through the introducer needle at the
biopsy site.

Samples were sent to pathology in formalin.

Needle removed and hemostasis achieved with 5 minutes of manual
compression.

Post procedure ultrasound images showed no evidence of significant
hemorrhage.
IMPRESSION: Ultrasound-guided biopsy of right liver mass as above.

## 2021-09-23 MED ORDER — LIDOCAINE HCL (PF) 1 % IJ SOLN
INTRAMUSCULAR | Status: AC | PRN
  Administered 2021-09-23: 10 mL via INTRADERMAL

## 2021-09-23 MED ORDER — SODIUM CHLORIDE 0.9 % IV SOLN
2.0000 g | INTRAVENOUS | Status: DC
  Filled 2021-09-23: qty 20

## 2021-09-23 MED ORDER — MIDAZOLAM HCL 2 MG/2ML IJ SOLN
INTRAMUSCULAR | Status: AC
  Filled 2021-09-23: qty 2

## 2021-09-23 MED ORDER — FENTANYL CITRATE (PF) 100 MCG/2ML IJ SOLN
INTRAMUSCULAR | Status: AC | PRN
  Administered 2021-09-23: 25 ug via INTRAVENOUS

## 2021-09-23 MED ORDER — FENTANYL CITRATE (PF) 100 MCG/2ML IJ SOLN
INTRAMUSCULAR | Status: AC
  Filled 2021-09-23: qty 2

## 2021-09-23 MED ORDER — MIDAZOLAM HCL 2 MG/2ML IJ SOLN
INTRAMUSCULAR | Status: AC | PRN
  Administered 2021-09-23: .5 mg via INTRAVENOUS

## 2021-09-23 MED ORDER — SODIUM CHLORIDE 0.9 % IV SOLN: INTRAVENOUS | Status: DC

## 2021-09-23 MED ORDER — GELATIN ABSORBABLE 12-7 MM EX MISC
CUTANEOUS | Status: AC
  Filled 2021-09-23: qty 1

## 2021-09-23 MED ORDER — LIDOCAINE HCL 1 % IJ SOLN
INTRAMUSCULAR | Status: AC
  Filled 2021-09-23: qty 20

## 2021-09-23 NOTE — Procedures (Signed)
Interventional Radiology Procedure Note  Procedure: US guided liver lesion biopsy  Indication: Multiple liver lesions  Findings: Please refer to procedural dictation for full description.  Complications: None  EBL: < 10 mL  Ellsie Violette, MD [phone]   

## 2021-09-23 NOTE — H&P (Signed)
Chief Complaint: Patient was seen in consultation today for image guided liver lesion biopsy at the request of Burchette,Bruce W  Referring Physician(s): [name]  Supervising Physician: Mir, Sharen Heck  Patient Status: [facility] - Out-pt  History of Present Illness: Robert Warner is a 80 y.o. male with PMHs of HTN, HLD, DM, and liver mass seen on [place] on 08/05/21 and MR 09/06/21.  IR was requested for image guided liver lesion biopsy by his PCP, and the case was approved by Dr. Vernard Gambles.  Patient was originally scheduled for a liver lesion biopsy at [facility] on 09/20/21; however, patient has suffered from CVA and was hospitalized at [facility] from 09/16/2021 to 09/19/2021, and he was placed on aspirin 325 mg and Plavix 75 mg per neurology.   IR was requested for image guided liver lesion biopsy while patient was hospitalized at [facility], however, liver lesion biopsy could not performed due to dual antiplatelet treatment.   After discussion with Dr. Leonie Man from neurology and Dr. Lupita Leash from [facility], patient received last dose of DAPT on 09/18/2021, and patient was scheduled for liver lesion biopsy at [facility] on 09/23/2021.   IR was requested for image guided liver lesion biopsy. Case was reviewed and approved by ultrasound-guided liver lesion biopsy by Dr. Vernard Gambles. Patient presents to [facility] IR today for the procedure.  Patient laying in bed, not in acute distress.  Denise headache, fever, chills, shortness of breath, cough, chest pain, abdominal pain, nausea ,vomiting, and bleeding.    Past Medical History:  Diagnosis Date   Arthritis    Diabetes mellitus    Hyperlipidemia    Hypertension    Stroke [facility])     Past Surgical History:  Procedure Laterality Date   APPENDECTOMY  1968   CATARACT EXTRACTION Bilateral 07/2000   states 2 weeks ago (first of sept) OD due in Dec    COLONOSCOPY  2004,2009   Negative, Dr. Sharlett Iles    KIDNEY STONE SURGERY  10/2018   PROSTATE BIOPSY   2006   Dr.Sigmund Tannebaum    Allergies: Patient has no known allergies.  Medications: Prior to Admission medications   Medication Sig Start Date End Date Taking? Authorizing Provider  aspirin EC 81 MG tablet Take 1 tablet (81 mg total) by mouth daily. Swallow whole. 09/25/21 10/25/21  Antonieta Pert, MD  atorvastatin (LIPITOR) 40 MG tablet Take 1 tablet (40 mg total) by mouth daily. 09/23/21 10/23/21  Antonieta Pert, MD  Cholecalciferol (VITAMIN D3) 2000 UNITS TABS Take 2,000 Units by mouth daily.    [provider]  clopidogrel (PLAVIX) 75 MG tablet Take 1 tablet (75 mg total) by mouth daily. Start after conforming with IR Doctor after biopsy 09/25/21 10/25/21  Antonieta Pert, MD  glucose blood (FREESTYLE LITE) test strip CHECK BLOOD SUGAR ONCE DAILY.  Dx:E11.9 08/04/20   Burchette, Alinda Sierras, MD  Lancets (FREESTYLE) lancets Check blood sugar once daily 03/15/12   Hendricks Limes, MD  metFORMIN (GLUCOPHAGE-XR) 750 MG 24 hr tablet TAKE 1 TABLET(750 MG) BY MOUTH DAILY WITH BREAKFAST Patient taking differently: Take 750 mg by mouth daily with breakfast. 08/22/21   Burchette, Alinda Sierras, MD  silodosin (RAPAFLO) 8 MG CAPS capsule Take 8 mg by mouth at bedtime.    [provider]  TRAVATAN Z 0.004 % SOLN ophthalmic solution Place 1 drop into both eyes at bedtime. 11/12/16   [provider]     Family History  Problem Relation Age of Onset   Stroke  Father 71   Hypertension Mother    Coronary artery disease Mother    Diabetes Maternal Aunt    Pancreatic cancer Brother    Stroke Paternal Grandmother    Breast cancer Paternal Aunt    Coronary artery disease Paternal Aunt    Coronary artery disease Maternal Grandmother    Coronary artery disease Maternal Uncle        2 Maternal Uncles    Diabetes Maternal Uncle    Stroke Sister    Heart disease Neg Hx     Social History   Socioeconomic History   Marital status: Married    Spouse name: Not on file   Number of children: 2    Years of education: Not on file   Highest education level: Not on file  Occupational History   Occupation: retired Airline pilot  Tobacco Use   Smoking status: Not on file   Smokeless tobacco: Never  Vaping Use   Vaping Use: Never used  Substance and Sexual Activity   Alcohol use: Yes    Alcohol/week: 2.0 standard drinks    Types: 2 Glasses of wine per week    Comment: Occasional wine or beer   Drug use: No   Sexual activity: Not on file  Other Topics Concern   Not on file  Social History Narrative   Not on file   Social Determinants of Health   Financial Resource Strain: Low Risk    Difficulty of Paying Living Expenses: Not hard at all  Food Insecurity: No Food Insecurity   Worried About Charity fundraiser in the Last Year: Never true   [place] in the Last Year: Never true  Transportation Needs: No Transportation Needs   Lack of Transportation (Medical): No   Lack of Transportation (Non-Medical): No  Physical Activity: Sufficiently Active   Days of Exercise per Week: 3 days   Minutes of Exercise per Session: 60 min  Stress: No Stress Concern Present   Feeling of Stress : Not at all  Social Connections: Socially Integrated   Frequency of Communication with Friends and Family: Three times a week   Frequency of Social Gatherings with Friends and Family: Three times a week   Attends Religious Services: More than 4 times per year   Active Member of Clubs or Organizations: Yes   Attends Archivist Meetings: More than 4 times per year   Marital Status: Married     Review of Systems: A 12 point ROS discussed and pertinent positives are indicated in the HPI above.  All other systems are negative.  Vital Signs: BP (!) 143/95   Pulse 75   Temp 98.1 F (36.7 C) (Oral)   Resp 14   SpO2 99%   Physical Exam Vitals and nursing note reviewed.  Constitutional:      General: Patient is not in acute distress.    Appearance: Normal appearance. Patient is  not ill-appearing.  HENT:     Head: Normocephalic and atraumatic.     Mouth/Throat:     Mouth: Mucous membranes are moist.     Pharynx: Oropharynx is clear.  Cardiovascular:     Rate and Rhythm: Normal rate and regular rhythm.     Pulses: Normal pulses.     Heart sounds: Normal heart sounds.  Pulmonary:     Effort: Pulmonary effort is normal.     Breath sounds: Normal breath sounds.  Abdominal:     General: Abdomen is flat. Bowel sounds are  normal.     Palpations: Abdomen is soft.  Musculoskeletal:     Cervical back: Neck supple.  Skin:    General: Skin is warm and dry.     Coloration: Skin is not jaundiced or pale.  Neurological:     Mental Status: Patient is alert and oriented to person, place, and time.  Psychiatric:        Mood and Affect: Mood normal.        Behavior: Behavior normal.        Judgment: Judgment normal.    MD Evaluation Airway: WNL Heart: WNL Abdomen: WNL Chest/ Lungs: WNL ASA  Classification: 3 Mallampati/Airway Score: Two  Imaging: CT ANGIO HEAD W OR WO CONTRAST  Result Date: 09/16/2021 CLINICAL DATA:  Dizziness EXAM: CT ANGIOGRAPHY HEAD AND NECK TECHNIQUE: Multidetector CT imaging of the head and neck was performed using the standard protocol during bolus administration of intravenous contrast. Multiplanar CT image reconstructions and MIPs were obtained to evaluate the vascular anatomy. Carotid stenosis measurements (when applicable) are obtained utilizing NASCET criteria, using the distal internal carotid diameter as the denominator. CONTRAST:  42mL OMNIPAQUE IOHEXOL 350 MG/ML SOLN COMPARISON:  None. FINDINGS: CTA NECK FINDINGS SKELETON: There is no bony spinal canal stenosis. No lytic or blastic lesion. OTHER NECK: Normal pharynx, larynx and major salivary glands. No cervical lymphadenopathy. Unremarkable thyroid gland. UPPER CHEST: Right upper lobe nodule measures 9 mm (series 5, image 8). AORTIC ARCH: There is no calcific atherosclerosis of the  aortic arch. There is no aneurysm, dissection or hemodynamically significant stenosis of the visualized portion of the aorta. Conventional 3 vessel aortic branching pattern. The visualized proximal subclavian arteries are widely patent. RIGHT CAROTID SYSTEM: Normal without aneurysm, dissection or stenosis. LEFT CAROTID SYSTEM: Normal without aneurysm, dissection or stenosis. VERTEBRAL ARTERIES: Left dominant configuration. Both origins are clearly patent. There is no dissection, occlusion or flow-limiting stenosis to the skull base (V1-V3 segments). CTA HEAD FINDINGS POSTERIOR CIRCULATION: --Vertebral arteries: Normal V4 segments. --Inferior cerebellar arteries: Normal. --Basilar artery: Normal. --Superior cerebellar arteries: Normal. --Posterior cerebral arteries (PCA): Normal. ANTERIOR CIRCULATION: --Intracranial internal carotid arteries: Normal. --Anterior cerebral arteries (ACA): Normal. Both A1 segments are present. Patent anterior communicating artery (a-comm). --Middle cerebral arteries (MCA): Normal. VENOUS SINUSES: As permitted by contrast timing, patent. ANATOMIC VARIANTS: None Review of the MIP images confirms the above findings. IMPRESSION: 1. No emergent large vessel occlusion or high-grade stenosis of the intracranial or cervical arteries. 2. 9 mm right upper lobe pulmonary nodule. Consider one of the following in 3 months for both low-risk and high-risk individuals: (a) repeat chest CT, (b) follow-up PET-CT, or (c) tissue sampling. This recommendation follows the consensus statement: Guidelines for Management of Incidental Pulmonary Nodules Detected on CT Images: From the Fleischner Society 2017; Radiology 2017; 284:228-243. Electronically Signed   By: Ulyses Jarred M.D.   On: 09/16/2021 22:52   DG Chest 2 View  Result Date: 09/20/2021 CLINICAL DATA:  Chest pain EXAM: CHEST - 2 VIEW COMPARISON:  10/27/2008 FINDINGS: The heart size and mediastinal contours are within normal limits. Both lungs are  clear. The visualized skeletal structures are unremarkable. IMPRESSION: No active cardiopulmonary disease. Electronically Signed   By: Donavan Foil M.D.   On: 09/20/2021 21:19   CT HEAD WO CONTRAST  Result Date: 09/16/2021 CLINICAL DATA:  Altered mental status. EXAM: CT HEAD WITHOUT CONTRAST TECHNIQUE: Contiguous axial images were obtained from the base of the skull through the vertex without intravenous contrast. COMPARISON:  None. FINDINGS: Brain: Mild  chronic ischemic white matter disease is noted. No mass effect or midline shift is noted. Ventricular size is within normal limits. There is no evidence of mass lesion, hemorrhage or acute infarction. Vascular: No hyperdense vessel or unexpected calcification. Skull: Normal. Negative for fracture or focal lesion. Sinuses/Orbits: No acute finding. Other: None. IMPRESSION: No acute intracranial abnormality seen. Electronically Signed   By: [name] M.D.   On: 09/16/2021 16:18   CT ANGIO NECK W OR WO CONTRAST  Result Date: 09/16/2021 CLINICAL DATA:  Dizziness EXAM: CT ANGIOGRAPHY HEAD AND NECK TECHNIQUE: Multidetector CT imaging of the head and neck was performed using the standard protocol during bolus administration of intravenous contrast. Multiplanar CT image reconstructions and MIPs were obtained to evaluate the vascular anatomy. Carotid stenosis measurements (when applicable) are obtained utilizing NASCET criteria, using the distal internal carotid diameter as the denominator. CONTRAST:  80mL OMNIPAQUE IOHEXOL 350 MG/ML SOLN COMPARISON:  None. FINDINGS: CTA NECK FINDINGS SKELETON: There is no bony spinal canal stenosis. No lytic or blastic lesion. OTHER NECK: Normal pharynx, larynx and major salivary glands. No cervical lymphadenopathy. Unremarkable thyroid gland. UPPER CHEST: Right upper lobe nodule measures 9 mm (series 5, image 8). AORTIC ARCH: There is no calcific atherosclerosis of the aortic arch. There is no aneurysm, dissection or  hemodynamically significant stenosis of the visualized portion of the aorta. Conventional 3 vessel aortic branching pattern. The visualized proximal subclavian arteries are widely patent. RIGHT CAROTID SYSTEM: Normal without aneurysm, dissection or stenosis. LEFT CAROTID SYSTEM: Normal without aneurysm, dissection or stenosis. VERTEBRAL ARTERIES: Left dominant configuration. Both origins are clearly patent. There is no dissection, occlusion or flow-limiting stenosis to the skull base (V1-V3 segments). CTA HEAD FINDINGS POSTERIOR CIRCULATION: --Vertebral arteries: Normal V4 segments. --Inferior cerebellar arteries: Normal. --Basilar artery: Normal. --Superior cerebellar arteries: Normal. --Posterior cerebral arteries (PCA): Normal. ANTERIOR CIRCULATION: --Intracranial internal carotid arteries: Normal. --Anterior cerebral arteries (ACA): Normal. Both A1 segments are present. Patent anterior communicating artery (a-comm). --Middle cerebral arteries (MCA): Normal. VENOUS SINUSES: As permitted by contrast timing, patent. ANATOMIC VARIANTS: None Review of the MIP images confirms the above findings. IMPRESSION: 1. No emergent large vessel occlusion or high-grade stenosis of the intracranial or cervical arteries. 2. 9 mm right upper lobe pulmonary nodule. Consider one of the following in 3 months for both low-risk and high-risk individuals: (a) repeat chest CT, (b) follow-up PET-CT, or (c) tissue sampling. This recommendation follows the consensus statement: Guidelines for Management of Incidental Pulmonary Nodules Detected on CT Images: From the Fleischner Society 2017; Radiology 2017; 284:228-243. Electronically Signed   By: Ulyses Jarred M.D.   On: 09/16/2021 22:52   MR Brain Wo Contrast (neuro protocol)  Result Date: 09/16/2021 CLINICAL DATA:  Dizziness EXAM: MRI HEAD WITHOUT CONTRAST TECHNIQUE: Multiplanar, multiecho pulse sequences of the brain and surrounding structures were obtained without intravenous  contrast. COMPARISON:  No prior MRI, correlation is made with 09/16/2021 CT head FINDINGS: Brain: Multiple small foci of restricted diffusion with ADC correlates in the cerebral cortex bilaterally, as well as cerebral white matter, right thalamus, and bilateral cerebellar hemispheres, likely acute infarcts, with additional increased signal on diffusion-weighted imaging without ADC correlate in the vermis and left aspect of the splenium of the corpus callosum, likely subacute infarcts. No evidence of hemorrhage, mass, mass effect, or midline shift. Confluent T2 hyperintense signal in the periventricular white matter and pons, likely the sequela of severe chronic small vessel ischemic disease. Vascular: Normal flow voids. Skull and upper cervical spine: Normal marrow  signal. Sinuses/Orbits: No acute finding. Status post bilateral lens replacements. Other: The mastoids are well aerated. IMPRESSION: Multiple small foci of restricted diffusion throughout the bilateral cerebral and cerebellar hemispheres, as well as the right thalamus, consistent with acute infarcts. Additional subacute infarcts in the vermis and left aspect of the splenium of the corpus callosum. Given different vascular territories, an embolic etiology is favored. These results were called by telephone at the time of interpretation on 09/16/2021 at 7:47 pm to provider Robert Warner , who verbally acknowledged these results. Electronically Signed   By: Merilyn Baba M.D.   On: 09/16/2021 19:48   MR LIVER W WO CONTRAST  Result Date: 09/06/2021 CLINICAL DATA:  Evaluate liver mass seen on recent abdominal sonogram. EXAM: MRI ABDOMEN WITHOUT AND WITH CONTRAST TECHNIQUE: Multiplanar multisequence MR imaging of the abdomen was performed both before and after the administration of intravenous contrast. CONTRAST:  62mL MULTIHANCE GADOBENATE DIMEGLUMINE 529 MG/ML IV SOLN COMPARISON:  08/05/2021 FINDINGS: Lower chest: No acute findings. Hepatobiliary: There are  multifocal rim enhancing lesions identified throughout both lobes of liver which are worrisome for metastatic disease. Index lesion within lateral dome of right hepatic lobe measures 1.1 cm, image 26/16. Segment 4a lesion measures 1.6 by 1.4 cm, image 34/16. Segment 2 lesion measures 0.8 x 0.7 cm, image 34/16. Within the posteromedial margin of right hepatic lobe subcapsular lesion measures 2.5 x 2.0 cm, image 67/16. The gallbladder is difficult to visualize being filled with stones and partially contracted. No intrahepatic bile duct dilatation. The common bile duct is upper limits of normal measuring 6 mm. Pancreas: No signs of pancreatic inflammation. The main pancreatic duct appears ectatic with scattered areas of side branch duct ectasia. Spleen:  Within normal limits in size and appearance. Adrenals/Urinary Tract: Normal adrenal glands. Right kidney cyst measures 7.0 x 4.2 cm, image 11/7. No internal septation or mural nodule. No suspicious kidney mass or hydronephrosis identified bilaterally Stomach/Bowel: Visualized portions within the abdomen are unremarkable. Vascular/Lymphatic: Normal appearance of the abdominal aorta. No adenopathy identified. Other:  None. Musculoskeletal: No suspicious bone lesions identified. IMPRESSION: 1. There are multifocal rim enhancing lesions identified throughout both lobes of liver which are worrisome for metastatic disease. Recommend correlation with tissue sampling. 2. Right kidney cyst. 3. The gallbladder is difficult to visualize being filled with stones and partially contracted. No intrahepatic bile duct dilatation. 4. The main pancreatic duct appears ectatic with scattered areas of side branch duct ectasia. Electronically Signed   By: Kerby Moors M.D.   On: 09/06/2021 08:15   CT CORONARY MORPH W/CTA COR W/SCORE W/CA W/CM &/OR WO/CM  Addendum Date: 09/22/2021   ADDENDUM REPORT: 09/22/2021 15:35 EXAM: OVER-READ INTERPRETATION  CT CHEST The following report is an  over-read performed by radiologist Dr. Rebekah Chesterfield [facility] on 09/22/2021. This over-read does not include interpretation of cardiac or coronary anatomy or pathology. The coronary calcium score and cardiac CTA interpretation by the cardiologist is attached. COMPARISON:  None. FINDINGS: Within the visualized portions of the thorax there are no suspicious appearing pulmonary nodules or masses, there is no acute consolidative airspace disease, no pleural effusions, no pneumothorax and no lymphadenopathy. Visualized portions of the upper abdomen are unremarkable. There are no aggressive appearing lytic or blastic lesions noted in the visualized portions of the skeleton. IMPRESSION: 1. No significant incidental noncardiac findings are noted. Electronically Signed   By: Vinnie Langton M.D.   On: 09/22/2021 15:35   Result Date: 09/22/2021 CLINICAL DATA:  51M with  chest pain EXAM: Cardiac/Coronary CTA TECHNIQUE: The patient was scanned on a [employer]. FINDINGS: A 100 kV prospective scan was triggered in the descending thoracic aorta at 111 HU's. Axial non-contrast 3 mm slices were carried out through the heart. The data set was analyzed on a dedicated work station and scored using the [place]. Gantry rotation speed was 250 msecs and collimation was .6 mm. No beta blockade and 0.8 mg of sl NTG was given. The 3D data set was reconstructed in 5% intervals of the 67-82 % of the R-R cycle. Diastolic phases were analyzed on a dedicated work station using MPR, MIP and VRT modes. The patient received 80 cc of contrast. Coronary Arteries:  Normal coronary origin.  Right dominance. RCA is a large dominant artery that gives rise to PDA and PLA. There is no plaque. Left main is a large artery that gives rise to LAD and LCX arteries. LAD is a large vessel that has no plaque. LCX is a non-dominant artery that gives rise to one large OM1 branch. There is no plaque. There is a ramus with no  plaque. Other findings: Left Ventricle: Normal size. PFO Left Atrium: Mild enlargement Pulmonary Veins: Normal configuration Right Ventricle: Mild enlargement Right Atrium: Mild enlargement Cardiac valves: No calcifications Thoracic aorta: Mild dilatation ascending aorta measuring 68mm Pulmonary Arteries: Normal size Systemic Veins: Normal drainage Pericardium: Normal thickness IMPRESSION: 1. Coronary calcium score of 0. 2. Normal coronary origin with right dominance. 3. No evidence of CAD. CAD-RADS 0. No evidence of CAD (0%). Consider non-atherosclerotic causes of chest pain. Electronically Signed: By: Oswaldo Milian M.D. On: 09/22/2021 14:57   VAS [place] TRANSCRANIAL DOPPLER W BUBBLES  Result Date: 09/20/2021  Transcranial Doppler with Bubble [name]  Date of Exam:   09/19/2021 Medical Rec #: [id number]        Accession #:    [id number] Date of Birth: 28-Jun-1941        Patient Gender: M Patient Age:   63 years Exam Location:  [facility] Procedure:      VAS [place] TRANSCRANIAL DOPPLER W BUBBLES Referring Phys: Elwin Sleight [place] --------------------------------------------------------------------------------  Indications: Stroke. Comparison Study: 09-17-2021 Echo complete w/ bubble was positive for                   intracardiac shunting. Performing Technologist: Darlin Coco RDMS, RVT  Examination Guidelines: A complete evaluation includes B-mode imaging, spectral Doppler, color Doppler, and power Doppler as needed of all accessible portions of each vessel. Bilateral testing is considered an integral part of a complete examination. Limited examinations for reoccurring indications may be performed as noted.  Summary:  A vascular evaluation was performed. The right middle cerebral artery was studied. An IV was inserted into the patient's left forearm. Verbal informed consent was obtained.  Between 15 and 30 high intensity transient signals (HITS) were observed at rest, and between 30  and 100 were observed with valsalva, indicating a Spencer grade 3 patent foramen ovale (PFO). Positive TCD Bubble study indicative of a medium size right to left shunt *See table(s) above for TCD measurements and observations.  Diagnosing physician: Antony Contras MD Electronically signed by Antony Contras MD on 09/20/2021 at 5:55:43 PM.    Final    ECHOCARDIOGRAM COMPLETE BUBBLE STUDY  Result Date: 09/17/2021    ECHOCARDIOGRAM REPORT   Patient Name:   Robert Warner Date of Exam: 09/17/2021 Medical Rec #:  [id number]  Height:       70.0 in Accession #:    [id number]      Weight:       200.0 lb Date of Birth:  1941/04/02       BSA:          2.087 m Patient Age:    22 years        BP:           133/95 mmHg Patient Gender: M               HR:           67 bpm. Exam Location:  Inpatient Procedure: 2D Echo, Cardiac Doppler, Color Doppler and Intracardiac            Opacification Agent Indications:    Stroke 434.91 / I63.9  History:        Patient has no prior history of Echocardiogram examinations.                 Risk Factors:Hypertension, Diabetes and Dyslipidemia.  Sonographer:    Darlina Sicilian RDCS Referring Phys: [id number] [place]  1. Left ventricular ejection fraction, by estimation, is 60 to 65%. The left ventricle has normal function. The left ventricle has no regional wall motion abnormalities. There is mild left ventricular hypertrophy. Left ventricular diastolic parameters are consistent with Grade I diastolic dysfunction (impaired relaxation).  2. Right ventricular systolic function is normal. The right ventricular size is normal.  3. The mitral valve is normal in structure. No evidence of mitral valve regurgitation. No evidence of mitral stenosis.  4. The aortic valve is tricuspid. Aortic valve regurgitation is not visualized. No aortic stenosis is present.  5. Aortic dilatation noted. There is mild dilatation of the ascending aorta, measuring 38 mm.  6. Agitated saline contrast  bubble study was positive with shunting observed within 3-6 cardiac cycles suggestive of interatrial shunt. FINDINGS  Left Ventricle: Left ventricular ejection fraction, by estimation, is 60 to 65%. The left ventricle has normal function. The left ventricle has no regional wall motion abnormalities. Definity contrast agent was given IV to delineate the left ventricular  endocardial borders. The left ventricular internal cavity size was normal in size. There is mild left ventricular hypertrophy. Left ventricular diastolic parameters are consistent with Grade I diastolic dysfunction (impaired relaxation). Normal left ventricular filling pressure. Right Ventricle: The right ventricular size is normal. No increase in right ventricular wall thickness. Right ventricular systolic function is normal. Left Atrium: Left atrial size was normal in size. Right Atrium: Right atrial size was normal in size. Pericardium: There is no evidence of pericardial effusion. Mitral Valve: The mitral valve is normal in structure. No evidence of mitral valve regurgitation. No evidence of mitral valve stenosis. Tricuspid Valve: The tricuspid valve is normal in structure. Tricuspid valve regurgitation is not demonstrated. No evidence of tricuspid stenosis. Aortic Valve: The aortic valve is tricuspid. Aortic valve regurgitation is not visualized. No aortic stenosis is present. Aortic valve mean gradient measures 2.6 mmHg. Aortic valve peak gradient measures 5.5 mmHg. Aortic valve area, by VTI measures 3.14 cm. Pulmonic Valve: The pulmonic valve was not well visualized. Pulmonic valve regurgitation is not visualized. No evidence of pulmonic stenosis. Aorta: The aortic root is normal in size and structure and aortic dilatation noted. There is mild dilatation of the ascending aorta, measuring 38 mm. IAS/Shunts: The interatrial septum was not well visualized. Agitated saline contrast was given intravenously to evaluate for intracardiac shunting.  Agitated saline contrast bubble study was positive with shunting observed within 3-6 cardiac cycles suggestive of interatrial shunt.  LEFT VENTRICLE PLAX 2D LVIDd:         4.00 cm   Diastology LVIDs:         2.90 cm   LV e' medial:    4.68 cm/s LV PW:         1.00 cm   LV E/e' medial:  12.0 LV IVS:        1.20 cm   LV e' lateral:   5.77 cm/s LVOT diam:     2.40 cm   LV E/e' lateral: 9.8 LV SV:         74 LV SV Index:   35 LVOT Area:     4.52 cm  RIGHT VENTRICLE RV S prime:     12.80 cm/s LEFT ATRIUM             Index       RIGHT ATRIUM          Index LA diam:        3.10 cm 1.49 cm/m  RA Area:     7.64 cm LA Vol (A2C):   15.3 ml 7.33 ml/m  RA Volume:   10.60 ml 5.08 ml/m LA Vol (A4C):   15.2 ml 7.28 ml/m LA Biplane Vol: 16.2 ml 7.76 ml/m  AORTIC VALVE AV Area (Vmax):    3.30 cm AV Area (Vmean):   3.23 cm AV Area (VTI):     3.14 cm AV Vmax:           117.28 cm/s AV Vmean:          74.266 cm/s AV VTI:            0.235 m AV Peak Grad:      5.5 mmHg AV Mean Grad:      2.6 mmHg LVOT Vmax:         85.52 cm/s LVOT Vmean:        53.105 cm/s LVOT VTI:          0.163 m LVOT/AV VTI ratio: 0.69  AORTA Ao Root diam: 3.80 cm Ao Asc diam:  3.80 cm MITRAL VALVE MV Area (PHT): 3.23 cm    SHUNTS MV Decel Time: 235 msec    Systemic VTI:  0.16 m MV E velocity: 56.30 cm/s  Systemic Diam: 2.40 cm MV A velocity: 99.40 cm/s MV E/A ratio:  0.57 Carlyle Dolly MD Electronically signed by Carlyle Dolly MD Signature Date/Time: 09/17/2021/11:35:46 AM    Final    VAS [place] LOWER EXTREMITY VENOUS (DVT)  Result Date: 09/18/2021  Lower Venous DVT Study [name]  Date of Exam:   09/18/2021 Medical Rec #: [id number]        Accession #:    [id number] Date of Birth: December 02, 1940        Patient Gender: M Patient Age:   38 years Exam Location:  [facility] Procedure:      VAS [place] LOWER EXTREMITY VENOUS (DVT) Referring Phys: JEFFREY MCCLUNG  --------------------------------------------------------------------------------  Indications: Stroke.  Comparison Study: No prior study on file Performing Technologist: Sharion Dove RVS  Examination Guidelines: A complete evaluation includes B-mode imaging, spectral Doppler, color Doppler, and power Doppler as needed of all accessible portions of each vessel. Bilateral testing is considered an integral part of a complete examination. Limited examinations for reoccurring indications may be performed as noted. The reflux portion of the exam  is performed with the patient in reverse Trendelenburg.  +---------+---------------+---------+-----------+----------+--------------+ RIGHT    CompressibilityPhasicitySpontaneityPropertiesThrombus Aging +---------+---------------+---------+-----------+----------+--------------+ CFV      Full           Yes      Yes                                 +---------+---------------+---------+-----------+----------+--------------+ SFJ      Full                                                        +---------+---------------+---------+-----------+----------+--------------+ FV Prox  Full                                                        +---------+---------------+---------+-----------+----------+--------------+ FV Mid   Full                                                        +---------+---------------+---------+-----------+----------+--------------+ FV DistalFull                                                        +---------+---------------+---------+-----------+----------+--------------+ PFV      Full                                                        +---------+---------------+---------+-----------+----------+--------------+ POP      Full           Yes      Yes                                 +---------+---------------+---------+-----------+----------+--------------+ PTV      Full                                                         +---------+---------------+---------+-----------+----------+--------------+ PERO     Full                                                        +---------+---------------+---------+-----------+----------+--------------+   +---------+---------------+---------+-----------+----------+--------------+ LEFT     CompressibilityPhasicitySpontaneityPropertiesThrombus Aging +---------+---------------+---------+-----------+----------+--------------+ CFV      Full           Yes      Yes                                 +---------+---------------+---------+-----------+----------+--------------+  SFJ      Full                                                        +---------+---------------+---------+-----------+----------+--------------+ FV Prox  Full                                                        +---------+---------------+---------+-----------+----------+--------------+ FV Mid   Full                                                        +---------+---------------+---------+-----------+----------+--------------+ FV DistalFull                                                        +---------+---------------+---------+-----------+----------+--------------+ PFV      Full                                                        +---------+---------------+---------+-----------+----------+--------------+ POP      Full           No       Yes                                 +---------+---------------+---------+-----------+----------+--------------+ PTV      Full                                                        +---------+---------------+---------+-----------+----------+--------------+ PERO     Full                                                        +---------+---------------+---------+-----------+----------+--------------+     Summary: BILATERAL: - No evidence of deep vein thrombosis seen in the lower extremities,  bilaterally. Bilateral popliteal, posterior tibial, and peroneal veins are dilated with rouleaux flow, but are easily compressible. -No evidence of popliteal cyst, bilaterally.   *See table(s) above for measurements and observations. Electronically signed by Monica Martinez MD on 09/18/2021 at 11:16:48 AM.    Final     Labs:  CBC: Recent Labs    09/18/21 0020 09/20/21 2050 09/21/21 0454 09/23/21 1118  WBC 4.9 5.9 5.1 5.0  HGB 13.6 13.7 13.1 13.7  HCT 41.4 41.8 40.3 41.7  PLT 177 187 195 181  COAGS: Recent Labs    09/16/21 1607 09/23/21 1118  INR 1.1 1.1  APTT 25  --     BMP: Recent Labs    09/20/21 2050 09/21/21 0454 09/22/21 0700 09/23/21 1118  NA 134* 135 135 135  K 4.2 4.3 4.4 4.2  CL 97* 101 103 103  CO2 24 26 26 25   GLUCOSE 239* 140* 167* 131*  BUN 21 18 15 22   CALCIUM 8.9 9.0 9.0 9.1  CREATININE 1.32* 1.17 1.12 1.28*  GFRNONAA 54* >60 >60 56*    LIVER FUNCTION TESTS: Recent Labs    09/13/21 0719 09/16/21 1607 09/18/21 0020 09/23/21 1118  BILITOT 1.2 0.7 0.9 1.0  AST 25 28 29  40  ALT 15 19 18  36  ALKPHOS 80 69 68 80  PROT 7.1 7.0 6.3* 7.3  ALBUMIN 4.1 3.5 3.2* 3.6    TUMOR MARKERS: No results for input(s): AFPTM, CEA, CA199, CHROMGRNA in the last 8760 hours.  Assessment and Plan: 80 y.o. male with multifocal ring-enhancing lesions throughout both lobes of liver worrisome for metastatic disease, in need of tissue sampling for further evaluation and management.  IR was requested for image guided liver lesion biopsy.  Patient presents to [facility] IR today for the procedure.  NPO since 7 am VS HTN 143/95 CBC all w/in normal limit  INR 1.1  Last ASA and Plavix taken on 11/6   Risks and benefits of liver lesion bx was discussed with the patient and/or patient's family including, but not limited to bleeding, infection, damage to adjacent structures or low yield requiring additional tests.  All of the questions were answered and there is agreement  to proceed.  Consent signed and in chart.     Thank you for this interesting consult.  I greatly enjoyed meeting Robert Warner and look forward to participating in their care.  A copy of this report was sent to the requesting provider on this date.  Electronically Signed: Tera Mater, PA-C 09/23/2021, 12:33 PM   I spent a total of  30 Minutes   in face to face in clinical consultation, greater than 50% of which was counseling/coordinating care for liver lesion bx   This chart was dictated using voice recognition software.  Despite best efforts to proofread,  errors can occur which can change the documentation meaning.

## 2021-09-23 NOTE — Telephone Encounter (Signed)
Neurology order placed.    Seems odd that hospital did not place prior to his discharge

## 2021-09-23 NOTE — Discharge Instructions (Signed)
Interventional radiology phone numbers [phone] After hours [phone]  Liver Biopsy, Care After These instructions give you information on caring for yourself after your procedure. Your doctor may also give you more specific instructions. Call your doctor if you have any problems or questions after your procedure. What can I expect after the procedure? After the procedure, it is common to have: Pain and soreness where the biopsy was done. Bruising around the area where the biopsy was done. Sleepiness and be tired for a few days. Follow these instructions at home: Medicines Take over-the-counter and prescription medicines only as told by your doctor. If you were prescribed an antibiotic medicine, take it as told by your doctor. Do not stop taking the antibiotic even if you start to feel better. Do not take medicines such as aspirin and ibuprofen. These medicines can thin your blood. Do not take these medicines unless your doctor tells you to take them. If you are taking prescription pain medicine, take actions to prevent or treat constipation. Your doctor may recommend that you: Drink enough fluid to keep your pee (urine) clear or pale yellow. Take over-the-counter or prescription medicines. Eat foods that are high in fiber, such as fresh fruits and vegetables, whole grains, and beans. Limit foods that are high in fat and processed sugars, such as fried and sweet foods. Caring for your cut Follow instructions from your doctor about how to take care of your cuts from surgery (incisions). Make sure you: Wash your hands with soap and water before you change your bandage (dressing). If you cannot use soap and water, use hand sanitizer. Change your bandage as told by your doctor. Check your cuts every day for signs of infection. Check for: Redness, swelling, or more pain. Fluid or blood. Pus or a bad smell. Warmth. Do not take baths, swim, or use a hot tub until your doctor says it is  okay to do so. You may remove your dressing tomorrow and shower. Activity Rest at home for 1-2 days or as told by your doctor. Avoid sitting for a long time without moving. Get up to take short walks every 1-2 hours. Return to your normal activities as told by your doctor. Ask what activities are safe for you. Do not do these things in the first 24 hours: Drive. Use machinery. Take a bath or shower. Do not lift more than 10 pounds (4.5 kg) or play contact sports for the first 2 weeks.   General instructions Do not drink alcohol in the first week after the procedure. Have someone stay with you for at least 24 hours after the procedure. Get your test results. Ask your doctor or the department that is doing the test: When will my results be ready? How will I get my results? What are my treatment options? What other tests do I need? What are my next steps? Keep all follow-up visits as told by your doctor. This is important.   Contact a doctor if: A cut bleeds and leaves more than just a small spot of blood. A cut is red, puffs up (swells), or hurts more than before. Fluid or something else comes from a cut. A cut smells bad. You have a fever or chills. Get help right away if: You have swelling, bloating, or pain in your belly (abdomen). You get dizzy or faint. You have a rash. You feel sick to your stomach (nauseous) or throw up (vomit). You have trouble breathing, feel short of breath, or feel faint. Your chest   hurts. You have problems talking or seeing. You have trouble with your balance or moving your arms or legs. Summary After the procedure, it is common to have pain, soreness, bruising, and tiredness. Your doctor will tell you how to take care of yourself at home. Change your bandage, take your medicines, and limit your activities as told by your doctor. Call your doctor if you have symptoms of infection. Get help right away if your belly swells, your cut bleeds a lot, or you  have trouble talking or breathing. This information is not intended to replace advice given to you by your health care provider. Make sure you discuss any questions you have with your health care provider. Document Revised: 11/08/2017 Document Reviewed: 11/09/2017 Elsevier Patient Education  2021 [employer]     Moderate Conscious Sedation, Adult, Care After This sheet gives you information about how to care for yourself after your procedure. Your health care provider may also give you more specific instructions. If you have problems or questions, contact your health care provider. What can I expect after the procedure? After the procedure, it is common to have: Sleepiness for several hours. Impaired judgment for several hours. Difficulty with balance. Vomiting if you eat too soon. Follow these instructions at home: For the time period you were told by your health care provider: Rest. Do not participate in activities where you could fall or become injured. Do not drive or use machinery. Do not drink alcohol. Do not take sleeping pills or medicines that cause drowsiness. Do not make important decisions or sign legal documents. Do not take care of children on your own.     Eating and drinking Follow the diet recommended by your health care provider. Drink enough fluid to keep your urine pale yellow. If you vomit: Drink water, juice, or soup when you can drink without vomiting. Make sure you have little or no nausea before eating solid foods.   General instructions Take over-the-counter and prescription medicines only as told by your health care provider. Have a responsible adult stay with you for the time you are told. It is important to have someone help care for you until you are awake and alert. Do not smoke. Keep all follow-up visits as told by your health care provider. This is important. Contact a health care provider if: You are still sleepy or having trouble with  balance after 24 hours. You feel light-headed. You keep feeling nauseous or you keep vomiting. You develop a rash. You have a fever. You have redness or swelling around the IV site. Get help right away if: You have trouble breathing. You have new-onset confusion at home. Summary After the procedure, it is common to feel sleepy, have impaired judgment, or feel nauseous if you eat too soon. Rest after you get home. Know the things you should not do after the procedure. Follow the diet recommended by your health care provider and drink enough fluid to keep your urine pale yellow. Get help right away if you have trouble breathing or new-onset confusion at home. This information is not intended to replace advice given to you by your health care provider. Make sure you discuss any questions you have with your health care provider. Document Revised: 02/27/2020 Document Reviewed: 09/25/2019 Elsevier Patient Education  2021 [employer] Inc[employer]  

## 2021-09-23 NOTE — Telephone Encounter (Signed)
-----   Message from Viona Gilmore, [facility] sent at 09/22/2021  5:16 PM EST ----- Regarding: Neurology referral Hi,  I just got a message from a transitions of care nurse asking if you could put in an order for a neurology referral. Mr. Grafton wife specifically requested Dr. Leonie Man if possible.  Thanks! Maddie

## 2021-09-26 ENCOUNTER — Ambulatory Visit (INDEPENDENT_AMBULATORY_CARE_PROVIDER_SITE_OTHER): Payer: Medicare HMO | Admitting: Family Medicine

## 2021-09-26 VITALS — BP 140/70 | HR 65 | Temp 98.2°F | Wt 193.4 lb

## 2021-09-26 DIAGNOSIS — E1169 Type 2 diabetes mellitus with other specified complication: Secondary | ICD-10-CM

## 2021-09-26 DIAGNOSIS — R160 Hepatomegaly, not elsewhere classified: Secondary | ICD-10-CM | POA: Diagnosis not present

## 2021-09-26 DIAGNOSIS — Z23 Encounter for immunization: Secondary | ICD-10-CM

## 2021-09-26 DIAGNOSIS — I639 Cerebral infarction, unspecified: Secondary | ICD-10-CM | POA: Diagnosis not present

## 2021-09-26 DIAGNOSIS — E782 Mixed hyperlipidemia: Secondary | ICD-10-CM | POA: Diagnosis not present

## 2021-09-26 NOTE — Progress Notes (Signed)
Established Patient Office Visit  Subjective:  Patient ID: Robert Warner, male    DOB: 13-Oct-1941  Age: 80 y.o. MRN: [id number]  CC:  Chief Complaint  Patient presents with   Hospitalization Follow-up    HPI [name] presents for follow-up from a couple of recent hospital admissions.  His chronic problems include history of hypertension, type 2 diabetes, chronic kidney disease, BPH.  He had some recent nonspecific symptoms of bloating and poorly localized abdominal pain.  We obtain ultrasound on 9/23 which showed contracted gallbladder containing sludge and stones but negative for acute gallbladder disease.  5mm indeterminate mass right hepatic lobe.  Subsequent MRI showed multifocal rim-enhancing lesions identified throughout both lobes of the liver worrisome for metastatic disease.  Recommendation at that point for tissue sampling.  Patient scheduled for outpatient biopsy.  He woke up 11/4 with unsteady gait which is a new symptom.  In the ER CT showed no acute findings.  MRI brain showed multiple small foci of restricted diffusion bilateral cerebellar and cerebral hemispheres consistent with acute infarct.  Transferred to [facility].  Echocardiogram with intra atrial shunt.  Lower extremity Dopplers negative.  A1c 6.9%.  Patient placed on aspirin and Plavix but because of need for liver biopsy these were stopped.  Continued on statin with Lipitor but dosage increased to 40 mg.  There is also 9 mm pulmonary nodule right upper lobe seen on CT angiogram with recommendation to consider CT chest in 3 months.  Patient was discharged on the seventh and then readmitted the very next day with complaint of some chest pressure and tightness.  Mildly elevated troponin in the ED but EKG no acute ischemic changes.  Cardiology consulted.  was felt that he had nonanginal pain.  CT coronary was normal with coronary calcium score of 0.  No further chest pain since discharge.  Appetite is fair.  He does have  some lower abdominal pain sometimes postprandial slightly right of midline down around the umbilicus.  Denies any upper abdominal pain at this time.  No nausea or vomiting.  No stool changes.  Type 2 diabetes.  Currently takes just metformin.  Recently on Rybelsus.  Tolerated 3 mg but had some symptoms with 7 mg.  Recent fasting blood sugars mostly 1 40-1 50 range.  Past Medical History:  Diagnosis Date   Arthritis    Diabetes mellitus    Hyperlipidemia    Hypertension    Stroke [facility])     Past Surgical History:  Procedure Laterality Date   APPENDECTOMY  1968   CATARACT EXTRACTION Bilateral 07/2000   states 2 weeks ago (first of sept) OD due in Dec    COLONOSCOPY  2004,2009   Negative, Dr. Sharlett Iles    KIDNEY STONE SURGERY  10/2018   PROSTATE BIOPSY  2006   Dr.Sigmund Tannebaum    Family History  Problem Relation Age of Onset   Stroke Father 44   Hypertension Mother    Coronary artery disease Mother    Diabetes Maternal Aunt    Pancreatic cancer Brother    Stroke Paternal Grandmother    Breast cancer Paternal Aunt    Coronary artery disease Paternal Aunt    Coronary artery disease Maternal Grandmother    Coronary artery disease Maternal Uncle        2 Maternal Uncles    Diabetes Maternal Uncle    Stroke Sister    Heart disease Neg Hx     Social History   Socioeconomic History  Marital status: Married    Spouse name: Not on file   Number of children: 2   Years of education: Not on file   Highest education level: Not on file  Occupational History   Occupation: retired Airline pilot  Tobacco Use   Smoking status: Not on file   Smokeless tobacco: Never  Vaping Use   Vaping Use: Never used  Substance and Sexual Activity   Alcohol use: Yes    Alcohol/week: 2.0 standard drinks    Types: 2 Glasses of wine per week    Comment: Occasional wine or beer   Drug use: No   Sexual activity: Not on file  Other Topics Concern   Not on file  Social History Narrative    Not on file   Social Determinants of Health   Financial Resource Strain: Low Risk    Difficulty of Paying Living Expenses: Not hard at all  Food Insecurity: No Food Insecurity   Worried About Charity fundraiser in the Last Year: Never true   [place] in the Last Year: Never true  Transportation Needs: No Transportation Needs   Lack of Transportation (Medical): No   Lack of Transportation (Non-Medical): No  Physical Activity: Sufficiently Active   Days of Exercise per Week: 3 days   Minutes of Exercise per Session: 60 min  Stress: No Stress Concern Present   Feeling of Stress : Not at all  Social Connections: Socially Integrated   Frequency of Communication with Friends and Family: Three times a week   Frequency of Social Gatherings with Friends and Family: Three times a week   Attends Religious Services: More than 4 times per year   Active Member of Clubs or Organizations: Yes   Attends Music therapist: More than 4 times per year   Marital Status: Married  Human resources officer Violence: Not At Risk   Fear of Current or Ex-Partner: No   Emotionally Abused: No   Physically Abused: No   Sexually Abused: No    Outpatient Medications Prior to Visit  Medication Sig Dispense Refill   aspirin EC 81 MG tablet Take 1 tablet (81 mg total) by mouth daily. Swallow whole. 30 tablet 0   atorvastatin (LIPITOR) 40 MG tablet Take 1 tablet (40 mg total) by mouth daily. 30 tablet 0   Cholecalciferol (VITAMIN D3) 2000 UNITS TABS Take 2,000 Units by mouth daily.     clopidogrel (PLAVIX) 75 MG tablet Take 1 tablet (75 mg total) by mouth daily. Start after conforming with IR Doctor after biopsy 30 tablet 0   glucose blood (FREESTYLE LITE) test strip CHECK BLOOD SUGAR ONCE DAILY.  Dx:E11.9 100 each 3   Lancets (FREESTYLE) lancets Check blood sugar once daily 100 each 3   metFORMIN (GLUCOPHAGE-XR) 750 MG 24 hr tablet TAKE 1 TABLET(750 MG) BY MOUTH DAILY WITH BREAKFAST (Patient taking  differently: Take 750 mg by mouth daily with breakfast.) 90 tablet 0   silodosin (RAPAFLO) 8 MG CAPS capsule Take 8 mg by mouth at bedtime.     TRAVATAN Z 0.004 % SOLN ophthalmic solution Place 1 drop into both eyes at bedtime.     No facility-administered medications prior to visit.    No Known Allergies  ROS Review of Systems  Constitutional:  Positive for unexpected weight change. Negative for chills and fever.  Respiratory:  Negative for cough and shortness of breath.   Cardiovascular:  Negative for chest pain.  Gastrointestinal:  Positive for abdominal pain. Negative  for abdominal distention, blood in stool, diarrhea, nausea and vomiting.  Genitourinary:  Negative for dysuria and hematuria.  Neurological:  Negative for weakness.  Hematological:  Negative for adenopathy.     Objective:    Physical Exam Vitals reviewed.  Cardiovascular:     Rate and Rhythm: Normal rate and regular rhythm.  Pulmonary:     Effort: Pulmonary effort is normal.     Breath sounds: Normal breath sounds.  Abdominal:     Palpations: Abdomen is soft. There is no mass.     Tenderness: There is no abdominal tenderness. There is no guarding or rebound.  Musculoskeletal:     Right lower leg: No edema.     Left lower leg: No edema.  Neurological:     General: No focal deficit present.     Mental Status: He is alert and oriented to person, place, and time.     Cranial Nerves: No cranial nerve deficit.     Motor: No weakness.     Coordination: Coordination normal.    BP 140/70 (BP Location: Left Arm, Patient Position: Sitting, Cuff Size: Normal)   Pulse 65   Temp 98.2 F (36.8 C) (Oral)   Wt 193 lb 6.4 oz (87.7 kg)   SpO2 97%   BMI 27.75 kg/m  Wt Readings from Last 3 Encounters:  09/26/21 193 lb 6.4 oz (87.7 kg)  09/21/21 194 lb 9.6 oz (88.3 kg)  09/16/21 200 lb (90.7 kg)     Health Maintenance Due  Topic Date Due   Zoster Vaccines- Shingrix (1 of 2) Never done   TETANUS/TDAP   06/15/2018   FOOT EXAM  07/06/2020   COVID-19 Vaccine (5 - Booster for Pfizer series) 05/04/2021    There are no preventive care reminders to display for this patient.  Lab Results  Component Value Date   TSH 1.25 10/15/2013   Lab Results  Component Value Date   WBC 5.0 09/23/2021   HGB 13.7 09/23/2021   HCT 41.7 09/23/2021   MCV 85.6 09/23/2021   PLT 181 09/23/2021   Lab Results  Component Value Date   NA 135 09/23/2021   K 4.2 09/23/2021   CO2 25 09/23/2021   GLUCOSE 131 (H) 09/23/2021   BUN 22 09/23/2021   CREATININE 1.28 (H) 09/23/2021   BILITOT 1.0 09/23/2021   ALKPHOS 80 09/23/2021   AST 40 09/23/2021   ALT 36 09/23/2021   PROT 7.3 09/23/2021   ALBUMIN 3.6 09/23/2021   CALCIUM 9.1 09/23/2021   ANIONGAP 7 09/23/2021   GFR 54.73 (L) 09/13/2021   Lab Results  Component Value Date   CHOL 164 09/17/2021   Lab Results  Component Value Date   HDL 57 09/17/2021   Lab Results  Component Value Date   LDLCALC 97 09/17/2021   Lab Results  Component Value Date   TRIG 49 09/17/2021   Lab Results  Component Value Date   CHOLHDL 2.9 09/17/2021   Lab Results  Component Value Date   HGBA1C 6.9 (H) 09/17/2021      Assessment & Plan:   #1 recent acute embolic stroke.  Work-up as above.  Patient has insert atrial cardiac shunt.  There is now back on Plavix and aspirin following recent biopsy.  Recent increase in Lipitor to 40 mg daily.  Recent LDL cholesterol 97 with A1c 6.9% Recheck lipids in a couple months  #2 liver masses with concern for possible metastatic disease.  He had biopsy Friday which is still pending.  Will likely need urgent referral to oncology and consider possible PET scan depending on biopsy results.  #3 nonspecific 9 mm right upper lobe nodule noted on recent imaging.  Consider repeat CT scan in 3 months  #4 type 2 diabetes.  History of reasonable control with recent A1c 6.9%.  Continue metformin.  We did discuss other options such as  long-acting insulin but this point they wish to work weight and observe and continue close monitoring.  If blood sugar starts to climb consider once daily long-acting insulin such as Tyler Aas or Toujeo  #5 recent chest pain with mild elevated troponin but coronary calcium score of 0.  No further chest pain.  Over 40 minutes were spent discussing recent hospital admissions and multiple recent studies and current management regarding his diabetes and diet.   No orders of the defined types were placed in this encounter.   Follow-up: No follow-ups on file.    [name], MD

## 2021-09-27 ENCOUNTER — Telehealth: Payer: Self-pay | Admitting: Family Medicine

## 2021-09-27 NOTE — Telephone Encounter (Signed)
Spoke with the patient's wife she is aware that we have not gotten those results back and will call her once Dr. Elease Hashimoto has reviewed these.

## 2021-09-27 NOTE — Telephone Encounter (Signed)
Patient and patient spouse is requesting a phone call back from Dr.Burchette at [phone] with the biopsy results.  Please advise.

## 2021-09-28 ENCOUNTER — Telehealth: Payer: Self-pay | Admitting: *Deleted

## 2021-09-28 ENCOUNTER — Telehealth: Payer: Self-pay | Admitting: Family Medicine

## 2021-09-28 DIAGNOSIS — C229 Malignant neoplasm of liver, not specified as primary or secondary: Secondary | ICD-10-CM

## 2021-09-28 NOTE — Progress Notes (Signed)
New Hematology/Oncology Consult   Requesting MD: Robert Warner  [phone]  Reason for Consult: Adenocarcinoma of the liver  HPI: [name] is an 80 year old man found on abdominal ultrasound 08/05/2021 to have an indeterminate 47mm solid mass in the right hepatic lobe.  MRI of the liver on 09/06/2021 showed multifocal rim-enhancing lesions throughout both lobes of the liver.  He underwent biopsy of a right liver mass on 09/23/2021.  Pathology shows poorly differentiated adenocarcinoma positive for cytokeratin 7, CDX2 and cytokeratin 20 and negative for TTF-1, PSA and prostein.  Differential diagnosis includes pancreatobiliary and less likely upper gastrointestinal.  He was hospitalized 09/16/2021 through 09/19/2021 with acute onset unsteady gait.  MRI brain revealed multiple small foci of restricted diffusion throughout the bilateral cerebellar and cerebral hemispheres consistent with acute infarct.  He was transferred to [facility] for multiple embolic appearing infarcts.  He was found to have an atrial left shunt on echocardiogram.  Transcranial bubble study indicative of a medium size right to left shunt.  Lower extremity venous Doppler negative for DVT bilaterally.  He was placed on aspirin and Plavix.  He was readmitted 09/20/2021 with chest pressure.  CT coronary obtained with no acute findings.  He was discharged home 09/22/2021.  Of note, CT angio neck on 09/16/2021 showed a 9 mm right upper lobe pulmonary nodule.   Past Medical History:  Diagnosis Date   Arthritis    Diabetes mellitus    Hyperlipidemia    Hypertension    Stroke [facility])   :   Past Surgical History:  Procedure Laterality Date   APPENDECTOMY  1968   CATARACT EXTRACTION Bilateral 07/2000   states 2 weeks ago (first of sept) OD due in Dec    COLONOSCOPY  2004,2009   Negative, Dr. Sharlett Warner    KIDNEY STONE SURGERY  10/2018   PROSTATE BIOPSY  2006   Robert Warner     Current Outpatient  Medications:    aspirin EC 81 MG tablet, Take 1 tablet (81 mg total) by mouth daily. Swallow whole., Disp: 30 tablet, Rfl: 0   atorvastatin (LIPITOR) 40 MG tablet, Take 1 tablet (40 mg total) by mouth daily., Disp: 30 tablet, Rfl: 0   Cholecalciferol (VITAMIN D3) 2000 UNITS TABS, Take 2,000 Units by mouth daily., Disp: , Rfl:    clopidogrel (PLAVIX) 75 MG tablet, Take 1 tablet (75 mg total) by mouth daily. Start after conforming with IR Doctor after biopsy, Disp: 30 tablet, Rfl: 0   glucose blood (FREESTYLE LITE) test strip, CHECK BLOOD SUGAR ONCE DAILY.  Dx:E11.9, Disp: 100 each, Rfl: 3   Lancets (FREESTYLE) lancets, Check blood sugar once daily, Disp: 100 each, Rfl: 3   metFORMIN (GLUCOPHAGE-XR) 750 MG 24 hr tablet, TAKE 1 TABLET(750 MG) BY MOUTH DAILY WITH BREAKFAST (Patient taking differently: Take 750 mg by mouth daily with breakfast.), Disp: 90 tablet, Rfl: 0   silodosin (RAPAFLO) 8 MG CAPS capsule, Take 8 mg by mouth at bedtime., Disp: , Rfl:    TRAVATAN Z 0.004 % SOLN ophthalmic solution, Place 1 drop into both eyes at bedtime., Disp: , Rfl: :   No Known Allergies:  FH: Brother deceased with pancreas cancer  SOCIAL HISTORY: He lives in [place].  He is married.  He has 2 daughters, both reported to be in good health.  1 daughter lives locally, the other is in [place].  He is a retired Animal nutritionist.  Social EtOH intake.  He smoked as a teenager until age 79.  Review of  Systems: He reports upper abdominal pain beginning about 3 months ago.  He is no longer having pain.  He reports a good appetite but estimates 15 pounds of weight loss over the past 3 to 4 weeks.  No fevers or sweats.  Occasional left jaw pain.  No dysphagia.  Yesterday had mild nausea.  No bleeding.  No change in bowel habits except "less regular" when he was in the hospital.  No further chest discomfort.  Balance is better but still having some issues.  No urinary symptoms.  No back pain.  No numbness or tingling in the  hands or feet.  Physical Exam:  Blood pressure 132/82, pulse 82, temperature 97.8 F (36.6 C), temperature source Oral, resp. rate 20, height 5\' 10"  (1.778 m), weight 194 lb 9.6 oz (88.3 kg), SpO2 100 %.  HEENT:  Sclera anicteric. Lungs: Lungs clear bilaterally. Cardiac: Regular rate and rhythm. Abdomen: Abdomen soft and nontender.  No hepatosplenomegaly.  Tender at the right upper abdomen.  No mass. Vascular: No leg edema. Lymph nodes: No palpable cervical, supra-clavicular, axillary or inguinal lymph nodes. Neurologic: Alert and oriented.  Follows commands.   LABS:  No results for input(s): WBC, HGB, HCT, PLT in the last 72 hours.  No results for input(s): NA, K, CL, CO2, GLUCOSE, BUN, CREATININE, CALCIUM in the last 72 hours.    RADIOLOGY:  CT ANGIO HEAD W OR WO CONTRAST  Result Date: 09/16/2021 CLINICAL DATA:  Dizziness EXAM: CT ANGIOGRAPHY HEAD AND NECK TECHNIQUE: Multidetector CT imaging of the head and neck was performed using the standard protocol during bolus administration of intravenous contrast. Multiplanar CT image reconstructions and MIPs were obtained to evaluate the vascular anatomy. Carotid stenosis measurements (when applicable) are obtained utilizing NASCET criteria, using the distal internal carotid diameter as the denominator. CONTRAST:  38mL OMNIPAQUE IOHEXOL 350 MG/ML SOLN COMPARISON:  None. FINDINGS: CTA NECK FINDINGS SKELETON: There is no bony spinal canal stenosis. No lytic or blastic lesion. OTHER NECK: Normal pharynx, larynx and major salivary glands. No cervical lymphadenopathy. Unremarkable thyroid gland. UPPER CHEST: Right upper lobe nodule measures 9 mm (series 5, image 8). AORTIC ARCH: There is no calcific atherosclerosis of the aortic arch. There is no aneurysm, dissection or hemodynamically significant stenosis of the visualized portion of the aorta. Conventional 3 vessel aortic branching pattern. The visualized proximal subclavian arteries are widely  patent. RIGHT CAROTID SYSTEM: Normal without aneurysm, dissection or stenosis. LEFT CAROTID SYSTEM: Normal without aneurysm, dissection or stenosis. VERTEBRAL ARTERIES: Left dominant configuration. Both origins are clearly patent. There is no dissection, occlusion or flow-limiting stenosis to the skull base (V1-V3 segments). CTA HEAD FINDINGS POSTERIOR CIRCULATION: --Vertebral arteries: Normal V4 segments. --Inferior cerebellar arteries: Normal. --Basilar artery: Normal. --Superior cerebellar arteries: Normal. --Posterior cerebral arteries (PCA): Normal. ANTERIOR CIRCULATION: --Intracranial internal carotid arteries: Normal. --Anterior cerebral arteries (ACA): Normal. Both A1 segments are present. Patent anterior communicating artery (a-comm). --Middle cerebral arteries (MCA): Normal. VENOUS SINUSES: As permitted by contrast timing, patent. ANATOMIC VARIANTS: None Review of the MIP images confirms the above findings. IMPRESSION: 1. No emergent large vessel occlusion or high-grade stenosis of the intracranial or cervical arteries. 2. 9 mm right upper lobe pulmonary nodule. Consider one of the following in 3 months for both low-risk and high-risk individuals: (a) repeat chest CT, (b) follow-up PET-CT, or (c) tissue sampling. This recommendation follows the consensus statement: Guidelines for Management of Incidental Pulmonary Nodules Detected on CT Images: From the Fleischner Society 2017; Radiology 2017; 284:228-243. Electronically Signed  By: Ulyses Jarred M.D.   On: 09/16/2021 22:52   DG Chest 2 View  Result Date: 09/20/2021 CLINICAL DATA:  Chest pain EXAM: CHEST - 2 VIEW COMPARISON:  10/27/2008 FINDINGS: The heart size and mediastinal contours are within normal limits. Both lungs are clear. The visualized skeletal structures are unremarkable. IMPRESSION: No active cardiopulmonary disease. Electronically Signed   By: Donavan Foil M.D.   On: 09/20/2021 21:19   CT HEAD WO CONTRAST  Result Date:  09/16/2021 CLINICAL DATA:  Altered mental status. EXAM: CT HEAD WITHOUT CONTRAST TECHNIQUE: Contiguous axial images were obtained from the base of the skull through the vertex without intravenous contrast. COMPARISON:  None. FINDINGS: Brain: Mild chronic ischemic white matter disease is noted. No mass effect or midline shift is noted. Ventricular size is within normal limits. There is no evidence of mass lesion, hemorrhage or acute infarction. Vascular: No hyperdense vessel or unexpected calcification. Skull: Normal. Negative for fracture or focal lesion. Sinuses/Orbits: No acute finding. Other: None. IMPRESSION: No acute intracranial abnormality seen. Electronically Signed   By: [name] M.D.   On: 09/16/2021 16:18   CT ANGIO NECK W OR WO CONTRAST  Result Date: 09/16/2021 CLINICAL DATA:  Dizziness EXAM: CT ANGIOGRAPHY HEAD AND NECK TECHNIQUE: Multidetector CT imaging of the head and neck was performed using the standard protocol during bolus administration of intravenous contrast. Multiplanar CT image reconstructions and MIPs were obtained to evaluate the vascular anatomy. Carotid stenosis measurements (when applicable) are obtained utilizing NASCET criteria, using the distal internal carotid diameter as the denominator. CONTRAST:  17mL OMNIPAQUE IOHEXOL 350 MG/ML SOLN COMPARISON:  None. FINDINGS: CTA NECK FINDINGS SKELETON: There is no bony spinal canal stenosis. No lytic or blastic lesion. OTHER NECK: Normal pharynx, larynx and major salivary glands. No cervical lymphadenopathy. Unremarkable thyroid gland. UPPER CHEST: Right upper lobe nodule measures 9 mm (series 5, image 8). AORTIC ARCH: There is no calcific atherosclerosis of the aortic arch. There is no aneurysm, dissection or hemodynamically significant stenosis of the visualized portion of the aorta. Conventional 3 vessel aortic branching pattern. The visualized proximal subclavian arteries are widely patent. RIGHT CAROTID SYSTEM: Normal without  aneurysm, dissection or stenosis. LEFT CAROTID SYSTEM: Normal without aneurysm, dissection or stenosis. VERTEBRAL ARTERIES: Left dominant configuration. Both origins are clearly patent. There is no dissection, occlusion or flow-limiting stenosis to the skull base (V1-V3 segments). CTA HEAD FINDINGS POSTERIOR CIRCULATION: --Vertebral arteries: Normal V4 segments. --Inferior cerebellar arteries: Normal. --Basilar artery: Normal. --Superior cerebellar arteries: Normal. --Posterior cerebral arteries (PCA): Normal. ANTERIOR CIRCULATION: --Intracranial internal carotid arteries: Normal. --Anterior cerebral arteries (ACA): Normal. Both A1 segments are present. Patent anterior communicating artery (a-comm). --Middle cerebral arteries (MCA): Normal. VENOUS SINUSES: As permitted by contrast timing, patent. ANATOMIC VARIANTS: None Review of the MIP images confirms the above findings. IMPRESSION: 1. No emergent large vessel occlusion or high-grade stenosis of the intracranial or cervical arteries. 2. 9 mm right upper lobe pulmonary nodule. Consider one of the following in 3 months for both low-risk and high-risk individuals: (a) repeat chest CT, (b) follow-up PET-CT, or (c) tissue sampling. This recommendation follows the consensus statement: Guidelines for Management of Incidental Pulmonary Nodules Detected on CT Images: From the Fleischner Society 2017; Radiology 2017; 284:228-243. Electronically Signed   By: Ulyses Jarred M.D.   On: 09/16/2021 22:52   MR Brain Wo Contrast (neuro protocol)  Result Date: 09/16/2021 CLINICAL DATA:  Dizziness EXAM: MRI HEAD WITHOUT CONTRAST TECHNIQUE: Multiplanar, multiecho pulse sequences of the brain and  surrounding structures were obtained without intravenous contrast. COMPARISON:  No prior MRI, correlation is made with 09/16/2021 CT head FINDINGS: Brain: Multiple small foci of restricted diffusion with ADC correlates in the cerebral cortex bilaterally, as well as cerebral white matter,  right thalamus, and bilateral cerebellar hemispheres, likely acute infarcts, with additional increased signal on diffusion-weighted imaging without ADC correlate in the vermis and left aspect of the splenium of the corpus callosum, likely subacute infarcts. No evidence of hemorrhage, mass, mass effect, or midline shift. Confluent T2 hyperintense signal in the periventricular white matter and pons, likely the sequela of severe chronic small vessel ischemic disease. Vascular: Normal flow voids. Skull and upper cervical spine: Normal marrow signal. Sinuses/Orbits: No acute finding. Status post bilateral lens replacements. Other: The mastoids are well aerated. IMPRESSION: Multiple small foci of restricted diffusion throughout the bilateral cerebral and cerebellar hemispheres, as well as the right thalamus, consistent with acute infarcts. Additional subacute infarcts in the vermis and left aspect of the splenium of the corpus callosum. Given different vascular territories, an embolic etiology is favored. These results were called by telephone at the time of interpretation on 09/16/2021 at 7:47 pm to provider HALEY SAGE , who verbally acknowledged these results. Electronically Signed   By: Merilyn Baba M.D.   On: 09/16/2021 19:48   MR LIVER W WO CONTRAST  Result Date: 09/06/2021 CLINICAL DATA:  Evaluate liver mass seen on recent abdominal sonogram. EXAM: MRI ABDOMEN WITHOUT AND WITH CONTRAST TECHNIQUE: Multiplanar multisequence MR imaging of the abdomen was performed both before and after the administration of intravenous contrast. CONTRAST:  31mL MULTIHANCE GADOBENATE DIMEGLUMINE 529 MG/ML IV SOLN COMPARISON:  08/05/2021 FINDINGS: Lower chest: No acute findings. Hepatobiliary: There are multifocal rim enhancing lesions identified throughout both lobes of liver which are worrisome for metastatic disease. Index lesion within lateral dome of right hepatic lobe measures 1.1 cm, image 26/16. Segment 4a lesion measures  1.6 by 1.4 cm, image 34/16. Segment 2 lesion measures 0.8 x 0.7 cm, image 34/16. Within the posteromedial margin of right hepatic lobe subcapsular lesion measures 2.5 x 2.0 cm, image 67/16. The gallbladder is difficult to visualize being filled with stones and partially contracted. No intrahepatic bile duct dilatation. The common bile duct is upper limits of normal measuring 6 mm. Pancreas: No signs of pancreatic inflammation. The main pancreatic duct appears ectatic with scattered areas of side branch duct ectasia. Spleen:  Within normal limits in size and appearance. Adrenals/Urinary Tract: Normal adrenal glands. Right kidney cyst measures 7.0 x 4.2 cm, image 11/7. No internal septation or mural nodule. No suspicious kidney mass or hydronephrosis identified bilaterally Stomach/Bowel: Visualized portions within the abdomen are unremarkable. Vascular/Lymphatic: Normal appearance of the abdominal aorta. No adenopathy identified. Other:  None. Musculoskeletal: No suspicious bone lesions identified. IMPRESSION: 1. There are multifocal rim enhancing lesions identified throughout both lobes of liver which are worrisome for metastatic disease. Recommend correlation with tissue sampling. 2. Right kidney cyst. 3. The gallbladder is difficult to visualize being filled with stones and partially contracted. No intrahepatic bile duct dilatation. 4. The main pancreatic duct appears ectatic with scattered areas of side branch duct ectasia. Electronically Signed   By: Kerby Moors M.D.   On: 09/06/2021 08:15   CT CORONARY MORPH W/CTA COR W/SCORE W/CA W/CM &/OR WO/CM  Addendum Date: 09/22/2021   ADDENDUM REPORT: 09/22/2021 15:35 EXAM: OVER-READ INTERPRETATION  CT CHEST The following report is an over-read performed by radiologist Dr. Rebekah Chesterfield [facility] on 09/22/2021. This  over-read does not include interpretation of cardiac or coronary anatomy or pathology. The coronary calcium score and cardiac CTA  interpretation by the cardiologist is attached. COMPARISON:  None. FINDINGS: Within the visualized portions of the thorax there are no suspicious appearing pulmonary nodules or masses, there is no acute consolidative airspace disease, no pleural effusions, no pneumothorax and no lymphadenopathy. Visualized portions of the upper abdomen are unremarkable. There are no aggressive appearing lytic or blastic lesions noted in the visualized portions of the skeleton. IMPRESSION: 1. No significant incidental noncardiac findings are noted. Electronically Signed   By: Vinnie Langton M.D.   On: 09/22/2021 15:35   Result Date: 09/22/2021 CLINICAL DATA:  26M with chest pain EXAM: Cardiac/Coronary CTA TECHNIQUE: The patient was scanned on a [employer]. FINDINGS: A 100 kV prospective scan was triggered in the descending thoracic aorta at 111 HU's. Axial non-contrast 3 mm slices were carried out through the heart. The data set was analyzed on a dedicated work station and scored using the [place]. Gantry rotation speed was 250 msecs and collimation was .6 mm. No beta blockade and 0.8 mg of sl NTG was given. The 3D data set was reconstructed in 5% intervals of the 67-82 % of the R-R cycle. Diastolic phases were analyzed on a dedicated work station using MPR, MIP and VRT modes. The patient received 80 cc of contrast. Coronary Arteries:  Normal coronary origin.  Right dominance. RCA is a large dominant artery that gives rise to PDA and PLA. There is no plaque. Left main is a large artery that gives rise to LAD and LCX arteries. LAD is a large vessel that has no plaque. LCX is a non-dominant artery that gives rise to one large OM1 branch. There is no plaque. There is a ramus with no plaque. Other findings: Left Ventricle: Normal size. PFO Left Atrium: Mild enlargement Pulmonary Veins: Normal configuration Right Ventricle: Mild enlargement Right Atrium: Mild enlargement Cardiac valves: No calcifications Thoracic  aorta: Mild dilatation ascending aorta measuring 14mm Pulmonary Arteries: Normal size Systemic Veins: Normal drainage Pericardium: Normal thickness IMPRESSION: 1. Coronary calcium score of 0. 2. Normal coronary origin with right dominance. 3. No evidence of CAD. CAD-RADS 0. No evidence of CAD (0%). Consider non-atherosclerotic causes of chest pain. Electronically Signed: By: Oswaldo Milian M.D. On: 09/22/2021 14:57   VAS [place] TRANSCRANIAL DOPPLER W BUBBLES  Result Date: 09/20/2021  Transcranial Doppler with Bubble [name]  Date of Exam:   09/19/2021 Medical Rec #: [id number]        Accession #:    [id number] Date of Birth: 02-27-1941        Patient Gender: M Patient Age:   50 years Exam [facility]  [facility] Procedure:      VAS [place] TRANSCRANIAL DOPPLER W BUBBLES Referring Phys: Elwin Sleight [place] --------------------------------------------------------------------------------  Indications: Stroke. Comparison Study: 09-17-2021 Echo complete w/ bubble was positive for                   intracardiac shunting. Performing Technologist: Darlin Coco RDMS, RVT  Examination Guidelines: A complete evaluation includes B-mode imaging, spectral Doppler, color Doppler, and power Doppler as needed of all accessible portions of each vessel. Bilateral testing is considered an integral part of a complete examination. Limited examinations for reoccurring indications may be performed as noted.  Summary:  A vascular evaluation was performed. The right middle cerebral artery was studied. An IV was inserted into the patient's left  forearm. Verbal informed consent was obtained.  Between 15 and 30 high intensity transient signals (HITS) were observed at rest, and between 30 and 100 were observed with valsalva, indicating a Spencer grade 3 patent foramen ovale (PFO). Positive TCD Bubble study indicative of a medium size right to left shunt *See table(s) above for TCD measurements and observations.   Diagnosing physician: Antony Contras MD Electronically signed by Antony Contras MD on 09/20/2021 at 5:55:43 PM.    Final    [place] CORE BIOPSY (LIVER)  Result Date: 09/23/2021 INDICATION: Multiple liver masses EXAM: Ultrasound-guided right liver mass biopsy MEDICATIONS: None. ANESTHESIA/SEDATION: Moderate (conscious) sedation was employed during this procedure. A total of Versed 0.5 mg and Fentanyl 50 mcg was administered intravenously. Moderate Sedation Time: 16 minutes. The patient's level of consciousness and vital signs were monitored continuously by radiology nursing throughout the procedure under my direct supervision. COMPLICATIONS: None immediate. PROCEDURE: Informed written consent was obtained from the patient after a thorough discussion of the procedural risks, benefits and alternatives. All questions were addressed. Maximal Sterile Barrier Technique was utilized including caps, mask, sterile gowns, sterile gloves, sterile drape, hand hygiene and skin antiseptic. A timeout was performed prior to the initiation of the procedure. Patient position supine on the ultrasound table. Right upper quadrant skin prepped and draped in usual sterile fashion. Following local lidocaine administration, 17 gauge introducer needle was advanced into the right hepatic lobe, and four 18 gauge cores were obtained utilizing continuous ultrasound guidance. Gelfoam slurry was administered through the introducer needle at the biopsy site. Samples were sent to pathology in formalin. Needle removed and hemostasis achieved with 5 minutes of manual compression. Post procedure ultrasound images showed no evidence of significant hemorrhage. IMPRESSION: Ultrasound-guided biopsy of right liver mass as above. Electronically Signed   By: Miachel Roux M.D.   On: 09/23/2021 13:49   ECHOCARDIOGRAM COMPLETE BUBBLE STUDY  Result Date: 09/17/2021    ECHOCARDIOGRAM REPORT   Patient Name:   Robert Warner Date of Exam: 09/17/2021 Medical Rec #:   [id number]       Height:       70.0 in Accession #:    [id number]      Weight:       200.0 lb Date of Birth:  [birth year]       BSA:          2.087 m Patient Age:    68 years        BP:           133/95 mmHg Patient Gender: M               HR:           67 bpm. Exam Location:  Inpatient Procedure: 2D Echo, Cardiac Doppler, Color Doppler and Intracardiac            Opacification Agent Indications:    Stroke 434.91 / I63.9  History:        Patient has no prior history of Echocardiogram examinations.                 Risk Factors:Hypertension, Diabetes and Dyslipidemia.  Sonographer:    Darlina Sicilian RDCS Referring Phys: [id number] [place]  1. Left ventricular ejection fraction, by estimation, is 60 to 65%. The left ventricle has normal function. The left ventricle has no regional wall motion abnormalities. There is mild left ventricular hypertrophy. Left ventricular diastolic parameters are consistent with Grade I diastolic dysfunction (impaired relaxation).  2. Right ventricular systolic function is normal. The right ventricular size is normal.  3. The mitral valve is normal in structure. No evidence of mitral valve regurgitation. No evidence of mitral stenosis.  4. The aortic valve is tricuspid. Aortic valve regurgitation is not visualized. No aortic stenosis is present.  5. Aortic dilatation noted. There is mild dilatation of the ascending aorta, measuring 38 mm.  6. Agitated saline contrast bubble study was positive with shunting observed within 3-6 cardiac cycles suggestive of interatrial shunt. FINDINGS  Left Ventricle: Left ventricular ejection fraction, by estimation, is 60 to 65%. The left ventricle has normal function. The left ventricle has no regional wall motion abnormalities. Definity contrast agent was given IV to delineate the left ventricular  endocardial borders. The left ventricular internal cavity size was normal in size. There is mild left ventricular hypertrophy. Left ventricular  diastolic parameters are consistent with Grade I diastolic dysfunction (impaired relaxation). Normal left ventricular filling pressure. Right Ventricle: The right ventricular size is normal. No increase in right ventricular wall thickness. Right ventricular systolic function is normal. Left Atrium: Left atrial size was normal in size. Right Atrium: Right atrial size was normal in size. Pericardium: There is no evidence of pericardial effusion. Mitral Valve: The mitral valve is normal in structure. No evidence of mitral valve regurgitation. No evidence of mitral valve stenosis. Tricuspid Valve: The tricuspid valve is normal in structure. Tricuspid valve regurgitation is not demonstrated. No evidence of tricuspid stenosis. Aortic Valve: The aortic valve is tricuspid. Aortic valve regurgitation is not visualized. No aortic stenosis is present. Aortic valve mean gradient measures 2.6 mmHg. Aortic valve peak gradient measures 5.5 mmHg. Aortic valve area, by VTI measures 3.14 cm. Pulmonic Valve: The pulmonic valve was not well visualized. Pulmonic valve regurgitation is not visualized. No evidence of pulmonic stenosis. Aorta: The aortic root is normal in size and structure and aortic dilatation noted. There is mild dilatation of the ascending aorta, measuring 38 mm. IAS/Shunts: The interatrial septum was not well visualized. Agitated saline contrast was given intravenously to evaluate for intracardiac shunting. Agitated saline contrast bubble study was positive with shunting observed within 3-6 cardiac cycles suggestive of interatrial shunt.  LEFT VENTRICLE PLAX 2D LVIDd:         4.00 cm   Diastology LVIDs:         2.90 cm   LV e' medial:    4.68 cm/s LV PW:         1.00 cm   LV E/e' medial:  12.0 LV IVS:        1.20 cm   LV e' lateral:   5.77 cm/s LVOT diam:     2.40 cm   LV E/e' lateral: 9.8 LV SV:         74 LV SV Index:   35 LVOT Area:     4.52 cm  RIGHT VENTRICLE RV S prime:     12.80 cm/s LEFT ATRIUM              Index       RIGHT ATRIUM          Index LA diam:        3.10 cm 1.49 cm/m  RA Area:     7.64 cm LA Vol (A2C):   15.3 ml 7.33 ml/m  RA Volume:   10.60 ml 5.08 ml/m LA Vol (A4C):   15.2 ml 7.28 ml/m LA Biplane Vol: 16.2 ml 7.76 ml/m  AORTIC VALVE AV Area (  Vmax):    3.30 cm AV Area (Vmean):   3.23 cm AV Area (VTI):     3.14 cm AV Vmax:           117.28 cm/s AV Vmean:          74.266 cm/s AV VTI:            0.235 m AV Peak Grad:      5.5 mmHg AV Mean Grad:      2.6 mmHg LVOT Vmax:         85.52 cm/s LVOT Vmean:        53.105 cm/s LVOT VTI:          0.163 m LVOT/AV VTI ratio: 0.69  AORTA Ao Root diam: 3.80 cm Ao Asc diam:  3.80 cm MITRAL VALVE MV Area (PHT): 3.23 cm    SHUNTS MV Decel Time: 235 msec    Systemic VTI:  0.16 m MV E velocity: 56.30 cm/s  Systemic Diam: 2.40 cm MV A velocity: 99.40 cm/s MV E/A ratio:  0.57 Carlyle Dolly MD Electronically signed by Carlyle Dolly MD Signature Date/Time: 09/17/2021/11:35:46 AM    Final    VAS [place] LOWER EXTREMITY VENOUS (DVT)  Result Date: 09/18/2021  Lower Venous DVT Study [name]  Date of Exam:   09/18/2021 Medical Rec #: [id number]        Accession #:    [id number] Date of Birth: 1941/06/16        Patient Gender: M Patient Age:   68 years Exam Location:  [facility] Procedure:      VAS [place] LOWER EXTREMITY VENOUS (DVT) Referring Phys: JEFFREY MCCLUNG --------------------------------------------------------------------------------  Indications: Stroke.  Comparison Study: No prior study on file Performing Technologist: Sharion Dove RVS  Examination Guidelines: A complete evaluation includes B-mode imaging, spectral Doppler, color Doppler, and power Doppler as needed of all accessible portions of each vessel. Bilateral testing is considered an integral part of a complete examination. Limited examinations for reoccurring indications may be performed as noted. The reflux portion of the exam is performed with the patient in reverse  Trendelenburg.  +---------+---------------+---------+-----------+----------+--------------+ RIGHT    CompressibilityPhasicitySpontaneityPropertiesThrombus Aging +---------+---------------+---------+-----------+----------+--------------+ CFV      Full           Yes      Yes                                 +---------+---------------+---------+-----------+----------+--------------+ SFJ      Full                                                        +---------+---------------+---------+-----------+----------+--------------+ FV Prox  Full                                                        +---------+---------------+---------+-----------+----------+--------------+ FV Mid   Full                                                        +---------+---------------+---------+-----------+----------+--------------+  FV DistalFull                                                        +---------+---------------+---------+-----------+----------+--------------+ PFV      Full                                                        +---------+---------------+---------+-----------+----------+--------------+ POP      Full           Yes      Yes                                 +---------+---------------+---------+-----------+----------+--------------+ PTV      Full                                                        +---------+---------------+---------+-----------+----------+--------------+ PERO     Full                                                        +---------+---------------+---------+-----------+----------+--------------+   +---------+---------------+---------+-----------+----------+--------------+ LEFT     CompressibilityPhasicitySpontaneityPropertiesThrombus Aging +---------+---------------+---------+-----------+----------+--------------+ CFV      Full           Yes      Yes                                  +---------+---------------+---------+-----------+----------+--------------+ SFJ      Full                                                        +---------+---------------+---------+-----------+----------+--------------+ FV Prox  Full                                                        +---------+---------------+---------+-----------+----------+--------------+ FV Mid   Full                                                        +---------+---------------+---------+-----------+----------+--------------+ FV DistalFull                                                        +---------+---------------+---------+-----------+----------+--------------+  PFV      Full                                                        +---------+---------------+---------+-----------+----------+--------------+ POP      Full           No       Yes                                 +---------+---------------+---------+-----------+----------+--------------+ PTV      Full                                                        +---------+---------------+---------+-----------+----------+--------------+ PERO     Full                                                        +---------+---------------+---------+-----------+----------+--------------+     Summary: BILATERAL: - No evidence of deep vein thrombosis seen in the lower extremities, bilaterally. Bilateral popliteal, posterior tibial, and peroneal veins are dilated with rouleaux flow, but are easily compressible. -No evidence of popliteal cyst, bilaterally.   *See table(s) above for measurements and observations. Electronically signed by Monica Martinez MD on 09/18/2021 at 11:16:48 AM.    Final     Assessment and Plan:   Poorly differentiated adenocarcinoma involving the liver Abdominal ultrasound 08/05/2021-indeterminate 34mm solid mass in the right hepatic lobe.   MRI of the liver 09/06/2021-multifocal rim-enhancing lesions  throughout both lobes of the liver.  Index lesion within the lateral dome of right hepatic lobe measures 1.1 cm, segment 4A lesion measures 1.6 x 1.4 cm, segment 2 lesion measures 0.8 x 0.7 cm, posteromedial margin of the right hepatic lobe subcapsular lesion measures 2.5 x 2.0 cm 09/16/2021 CT angio neck-9 mm right upper lobe nodule Biopsy of a right liver mass on 09/23/2021.  Pathology shows poorly differentiated adenocarcinoma positive for cytokeratin 7, CDX2 and cytokeratin 20 and negative for TTF-1, PSA and prostein.  Differential diagnoses include pancreatobiliary and less likely upper GI. Hospital admission 09/16/2021 - 09/19/2021 with gait disturbance-multifocal embolic stroke on MRI 15/11/7614, echo with interatrial shunt, transcranial Doppler with bubble study indicative of a medium size right to left shunt, lower extremity Doppler studies negative for DVT Hospital admission 09/20/2021 - 09/22/2021 with chest pain-CT coronary with no acute findings.   Diabetes Hypertension BPH Chronic kidney disease  Robert Warner has been diagnosed with poorly differentiated adenocarcinoma involving the liver.  Primary site is not known.  [name] reviewed the diagnosis with him and his wife at today's visit.  We are referring him for a staging PET scan.  We will obtain labs today to include a chemistry panel, CA 19-9 and CEA.  He will return for follow-up on 10/11/2021 to review the results and discuss a treatment plan.  Patient seen with [name].   Ned Card, NP 09/29/2021, 10:33 AM   This was a shared visit  with Ned Card.  Robert Warner was interviewed and examined.  We reviewed the MRI images with him.  He has been diagnosed with metastatic adenocarcinoma involving a liver biopsy.  There is no apparent primary tumor site on review of his physical exam and imaging.  We suspect a GI primary.  He will be referred for a PET scan and return for an office visit 10/11/2021.  I was present for  greater than 50% of today's visit.  I performed medical decision making.  Julieanne Manson, MD

## 2021-09-28 NOTE — Telephone Encounter (Signed)
Patient had some recent dyspepsia.  Was sent for ultrasound which showed question of right liver lobe lesion.  Subsequent MRI showed multiple liver lesions in both lobes.  Biopsy results came back yesterday poorly differentiated adenocarcinoma. I discussed with patient and his wife last evening.  I have placed urgent referral to oncology.  Have phone call placed to oncology to discuss further.

## 2021-09-29 ENCOUNTER — Other Ambulatory Visit: Payer: Self-pay

## 2021-09-29 ENCOUNTER — Inpatient Hospital Stay: Payer: Medicare HMO

## 2021-09-29 ENCOUNTER — Encounter: Payer: Self-pay | Admitting: Nurse Practitioner

## 2021-09-29 ENCOUNTER — Telehealth: Payer: Self-pay

## 2021-09-29 ENCOUNTER — Inpatient Hospital Stay (HOSPITAL_BASED_OUTPATIENT_CLINIC_OR_DEPARTMENT_OTHER): Payer: Medicare HMO | Admitting: Nurse Practitioner

## 2021-09-29 ENCOUNTER — Ambulatory Visit: Payer: TRICARE For Life (TFL) | Admitting: Neurology

## 2021-09-29 ENCOUNTER — Encounter: Payer: Self-pay | Admitting: *Deleted

## 2021-09-29 VITALS — BP 132/82 | HR 82 | Temp 97.8°F | Resp 20 | Ht 70.0 in | Wt 194.6 lb

## 2021-09-29 DIAGNOSIS — I6310 Cerebral infarction due to embolism of unspecified precerebral artery: Secondary | ICD-10-CM | POA: Diagnosis not present

## 2021-09-29 DIAGNOSIS — C229 Malignant neoplasm of liver, not specified as primary or secondary: Secondary | ICD-10-CM

## 2021-09-29 DIAGNOSIS — R978 Other abnormal tumor markers: Secondary | ICD-10-CM | POA: Diagnosis not present

## 2021-09-29 DIAGNOSIS — C801 Malignant (primary) neoplasm, unspecified: Secondary | ICD-10-CM | POA: Diagnosis not present

## 2021-09-29 DIAGNOSIS — R293 Abnormal posture: Secondary | ICD-10-CM | POA: Diagnosis not present

## 2021-09-29 DIAGNOSIS — R2681 Unsteadiness on feet: Secondary | ICD-10-CM | POA: Diagnosis not present

## 2021-09-29 DIAGNOSIS — R2689 Other abnormalities of gait and mobility: Secondary | ICD-10-CM | POA: Diagnosis not present

## 2021-09-29 DIAGNOSIS — M6281 Muscle weakness (generalized): Secondary | ICD-10-CM | POA: Diagnosis not present

## 2021-09-29 LAB — CMP (CANCER CENTER ONLY)
ALT: 29 U/L (ref 0–44)
AST: 37 U/L (ref 15–41)
Albumin: 4.2 g/dL (ref 3.5–5.0)
Alkaline Phosphatase: 74 U/L (ref 38–126)
Anion gap: 4 — ABNORMAL LOW (ref 5–15)
BUN: 21 mg/dL (ref 8–23)
CO2: 30 mmol/L (ref 22–32)
Calcium: 10 mg/dL (ref 8.9–10.3)
Chloride: 100 mmol/L (ref 98–111)
Creatinine: 1.24 mg/dL (ref 0.61–1.24)
GFR, Estimated: 58 mL/min — ABNORMAL LOW (ref >60)
Glucose, Bld: 153 mg/dL — ABNORMAL HIGH (ref 70–99)
Potassium: 4.8 mmol/L (ref 3.5–5.1)
Sodium: 134 mmol/L — ABNORMAL LOW (ref 135–145)
Total Bilirubin: 0.6 mg/dL (ref 0.3–1.2)
Total Protein: 7.7 g/dL (ref 6.5–8.1)

## 2021-09-29 LAB — CEA (ACCESS): CEA (CHCC): 33.79 ng/mL — ABNORMAL HIGH (ref 0.00–5.00)

## 2021-09-29 NOTE — Progress Notes (Signed)
PATIENT NAVIGATOR PROGRESS NOTE  Name: Robert Warner Date: 09/29/2021 MRN: [id number]  DOB: [birth year]   Reason for visit:  Initial visit with Dr Benay Spice and Ned Card NP  Comments:  Met with [name] during initial visit with Dr Benay Spice, reviewed any needs, transportation etc. No needs identified.  End of visit reviewed upcoming test. Lab test today  PET scan scheduled for 11/28  Dr Benay Spice on 11/29  Gave contact information and encouraged to call with any issues or concerns    Time spent counseling/coordinating care: 45-60 minutes

## 2021-09-30 ENCOUNTER — Other Ambulatory Visit: Payer: Self-pay | Admitting: Nurse Practitioner

## 2021-09-30 ENCOUNTER — Encounter: Payer: Self-pay | Admitting: Physical Therapy

## 2021-09-30 ENCOUNTER — Telehealth: Payer: Self-pay | Admitting: Pharmacist

## 2021-09-30 ENCOUNTER — Ambulatory Visit: Payer: Medicare HMO | Attending: Internal Medicine | Admitting: Physical Therapy

## 2021-09-30 ENCOUNTER — Telehealth: Payer: Self-pay | Admitting: Nurse Practitioner

## 2021-09-30 ENCOUNTER — Encounter: Payer: Self-pay | Admitting: Nurse Practitioner

## 2021-09-30 DIAGNOSIS — M6281 Muscle weakness (generalized): Secondary | ICD-10-CM

## 2021-09-30 DIAGNOSIS — R293 Abnormal posture: Secondary | ICD-10-CM

## 2021-09-30 DIAGNOSIS — R2681 Unsteadiness on feet: Secondary | ICD-10-CM

## 2021-09-30 DIAGNOSIS — R978 Other abnormal tumor markers: Secondary | ICD-10-CM | POA: Insufficient documentation

## 2021-09-30 DIAGNOSIS — I6310 Cerebral infarction due to embolism of unspecified precerebral artery: Secondary | ICD-10-CM | POA: Insufficient documentation

## 2021-09-30 DIAGNOSIS — C229 Malignant neoplasm of liver, not specified as primary or secondary: Secondary | ICD-10-CM

## 2021-09-30 DIAGNOSIS — C801 Malignant (primary) neoplasm, unspecified: Secondary | ICD-10-CM | POA: Insufficient documentation

## 2021-09-30 DIAGNOSIS — R2689 Other abnormalities of gait and mobility: Secondary | ICD-10-CM | POA: Diagnosis not present

## 2021-09-30 LAB — CANCER ANTIGEN 19-9: CA 19-9: 81 U/mL — ABNORMAL HIGH (ref 0–35)

## 2021-09-30 MED ORDER — PANTOPRAZOLE SODIUM 20 MG PO TBEC: 20.0000 mg | DELAYED_RELEASE_TABLET | Freq: Every day | ORAL | 1 refills | Status: DC

## 2021-09-30 NOTE — Telephone Encounter (Signed)
I called Robert Warner regarding the MyChart message we received.  I spoke with his wife.  He will begin Protonix 20 mg daily.

## 2021-09-30 NOTE — Patient Instructions (Signed)
Access Code: [email or web address] URL: [email or web address] Date: 09/30/2021 Prepared by: Grayling Congress  Exercises Supine Bridge with Resistance Band - 1 x daily - 5 x weekly - 2 sets - 10 reps Sidelying Hip Abduction - 1 x daily - 5 x weekly - 2 sets - 10 reps Narrow Stance with Counter Support - 1 x daily - 5 x weekly - 3 sets - 30 sec hold Tandem Walking with Counter Support - 1 x daily - 5 x weekly - 2 sets - 10 reps Heel Toe Raises with Counter Support - 1 x daily - 5 x weekly - 2 sets - 10 reps

## 2021-09-30 NOTE — Chronic Care Management (AMB) (Signed)
    Chronic Care Management Pharmacy Assistant   Name: Robert Warner  MRN: [id number] DOB: 11-22-1940   09/30/21 APPOINTMENT REMINDER   Called Patient No answer, left message of appointment on 10/03/21 at 10 via telephone visit with [name], Pharm D.   Notified to have all medications, supplements, blood pressure and/or blood sugar logs available during appointment and to return call if need to reschedule.   Care Gaps: BP - 132/82 (09/29/21) Zoster Vaccines - Overdue TDAP - Overdue Foot Exam - Overdue COVID Booster - Overdue AWV-9/22 Lab Results  Component Value Date   HGBA1C 6.9 (H) 09/17/2021    Star Rating Drug: Atorvastatin (Lipitor) 40 mg - Last filled 09/24/21 30 DS at [facility] Metformin 750 mg - Last filled 09/05/21 90 DS at [facility] Januvia 100 mg - Last filled 05/23/21 90 DS at [facility] Pravastatin 40 mg - Last filled 09/21/21 30 DS at [facility] Rybelsus 7 mg - Last filled 08/31/21 90 DS at [facility]  Any gaps in medications fill history? Call to [facility] verified the above dates as accurate  Medications: Outpatient Encounter Medications as of 09/30/2021  Medication Sig   aspirin EC 81 MG tablet Take 1 tablet (81 mg total) by mouth daily. Swallow whole.   atorvastatin (LIPITOR) 40 MG tablet Take 1 tablet (40 mg total) by mouth daily.   Cholecalciferol (VITAMIN D3) 2000 UNITS TABS Take 2,000 Units by mouth daily.   clopidogrel (PLAVIX) 75 MG tablet Take 1 tablet (75 mg total) by mouth daily. Start after conforming with IR Doctor after biopsy   glucose blood (FREESTYLE LITE) test strip CHECK BLOOD SUGAR ONCE DAILY.  Dx:E11.9   Lancets (FREESTYLE) lancets Check blood sugar once daily   metFORMIN (GLUCOPHAGE-XR) 750 MG 24 hr tablet TAKE 1 TABLET(750 MG) BY MOUTH DAILY WITH BREAKFAST (Patient taking differently: Take 750 mg by mouth daily with breakfast.)   silodosin (RAPAFLO) 8 MG CAPS capsule Take 8 mg by mouth at bedtime.   TRAVATAN Z 0.004 % SOLN  ophthalmic solution Place 1 drop into both eyes at bedtime.   No facility-administered encounter medications on file as of 09/30/2021.    [place] Clinical Pharmacist Assistant [phone]

## 2021-09-30 NOTE — Therapy (Signed)
[facility] [address], [place], [place], [address] Phone: [phone]   Fax:  [phone]  Physical Therapy Evaluation  Patient Details  Name: Robert Warner MRN: [id number] Date of Birth: [birth year] Referring Provider (PT): Antonieta Pert, MD   Encounter Date: 09/30/2021   PT End of Session - 09/30/21 1159     Visit Number 1    Number of Visits 10    Date for PT Re-Evaluation 11/11/21    Authorization Type [employer]    PT Start Time 1108    PT Stop Time 1148    PT Time Calculation (min) 40 min    Equipment Utilized During Treatment Gait belt    Activity Tolerance Patient tolerated treatment well    Behavior During Therapy [facility] for tasks assessed/performed             Past Medical History:  Diagnosis Date   Arthritis    Diabetes mellitus    Hyperlipidemia    Hypertension    Stroke [facility])     Past Surgical History:  Procedure Laterality Date   APPENDECTOMY  1968   CATARACT EXTRACTION Bilateral 07/2000   states 2 weeks ago (first of sept) OD due in Dec    COLONOSCOPY  2004,2009   Negative, Dr. Sharlett Iles    KIDNEY STONE SURGERY  10/2018   PROSTATE BIOPSY  2006   Dr.Sigmund Tannebaum    There were no vitals filed for this visit.    Subjective Assessment - 09/30/21 1110     Subjective Patient reports going to the ED on 11/04 for instability, found to have multiple strokes. After DC on 11/07, had another ED visit for chest pain on 11/10 but workup negative. Reports still having stomach pain that he had before he went to the hospital. Worse after meals and nausea. Seeing MD for abdominal issues that may be tied to incidental finding of cancerous liver lesions on MRI. Continuing workup to figure out where this CA was spread from. Reports borrowing SPC from wife. Feels a little off balance, especially going to one direction. Denies weakness, N/T, dizziness. Straining to talk and some soreness in jaws when chewing. No AD at  [facility].    Pertinent History DMT2, recent CVA, HTN, HLD    Limitations Lifting;Standing;Walking;Writing    Diagnostic tests 09/16/21 brain MRI: Multiple small foci of restricted diffusion throughout the bilateral cerebral and cerebellar hemispheres, as well as the right thalamus,  consistent with acute infarcts. Additional subacute infarcts in the  vermis and left aspect of the splenium of the corpus callosum. Given  different vascular territories, an embolic etiology is favored.    Patient Stated Goals improve balance    Currently in Pain? Yes    Pain Score 3     Pain Location Abdomen    Pain Descriptors / Indicators Aching    Pain Type Acute pain                [facility] PT Assessment - 09/30/21 1117       Assessment   Medical Diagnosis Cerebrovascular accident (CVA) due to embolism of precerebral artery    Referring Provider (PT) Antonieta Pert, MD    Onset Date/Surgical Date 09/16/21    Next MD Visit not scheduled    Prior Therapy no      Precautions   Precautions --   currently undergoing work up for liver CA     Balance Screen   Has the patient fallen in  the past 6 months No    Has the patient had a decrease in activity level because of a fear of falling?  No    Is the patient reluctant to leave their home because of a fear of falling?  No      Home Ecologist residence    Living Arrangements Spouse/significant other    Available Help at Discharge Family    Type of [place] to enter    Entrance Stairs-Number of Steps 5+8    Entrance Stairs-Rails Cannot reach both;Can reach both;Left;Right    Home Layout Two level;Able to live on main level with bedroom/bathroom    Alternate Level Stairs-Number of Steps 12-15    Alternate Level Stairs-Rails Right    Home Equipment Equality - single point;Walker - 4 wheels;Shower seat;Grab bars - tub/shower;Grab bars - toilet      Prior Function   Level of Independence Independent     Vocation Retired    Environmental health practitioner   Overall Cognitive Status Within Functional Limits for tasks assessed      Sensation   Light Touch Appears Intact      Coordination   Finger Nose Finger Test mild dysmetria B which improved with increased reps   B alt pron/sup normal   Heel Shin Test intact      Posture/Postural Control   Posture/Postural Control Postural limitations    Postural Limitations Rounded Shoulders;Forward head;Increased thoracic kyphosis;Posterior pelvic tilt      Tone   Assessment Location Right Lower Extremity;Left Lower Extremity      ROM / Strength   AROM / PROM / Strength AROM;Strength      AROM   AROM Assessment Site Ankle    Right/Left Ankle Right;Left    Right Ankle Dorsiflexion 15    Left Ankle Dorsiflexion 17      Strength   Overall Strength Comments in sitting    Strength Assessment Site Hip;Knee;Ankle    Right/Left Hip Right;Left    Right Hip Flexion 4+/5    Right Hip ABduction 4+/5    Right Hip ADduction 4+/5    Left Hip Flexion 4/5    Left Hip ABduction 4/5    Left Hip ADduction 4/5    Right/Left Knee Right;Left    Right Knee Flexion 4+/5    Right Knee Extension 4+/5    Left Knee Flexion 4/5    Left Knee Extension 4/5    Right/Left Ankle Right;Left    Right Ankle Dorsiflexion 4+/5    Right Ankle Plantar Flexion 4/5    Left Ankle Dorsiflexion 4+/5    Left Ankle Plantar Flexion 4/5      Palpation   Palpation comment denies pain      Ambulation/Gait   Assistive device None    Gait Pattern Step-through pattern;Decreased stance time - left;Decreased stance time - right;Poor foot clearance - left;Poor foot clearance - right;Trunk flexed   narrow BOS   Ambulation Surface Level;Indoor    Gait velocity decreased      Functional Gait  Assessment   Gait assessed  Yes    Gait Level Surface Walks 20 ft in less than 7 sec but greater than 5.5 sec, uses assistive device, slower speed, mild gait deviations, or deviates 6-10 in  outside of the 12 in walkway width.    Change in Gait Speed Able to smoothly change walking speed without loss of balance or gait  deviation. Deviate no more than 6 in outside of the 12 in walkway width.    Gait with Horizontal Head Turns Performs head turns smoothly with slight change in gait velocity (eg, minor disruption to smooth gait path), deviates 6-10 in outside 12 in walkway width, or uses an assistive device.    Gait with Vertical Head Turns Performs task with slight change in gait velocity (eg, minor disruption to smooth gait path), deviates 6 - 10 in outside 12 in walkway width or uses assistive device    Gait and Pivot Turn Pivot turns safely in greater than 3 sec and stops with no loss of balance, or pivot turns safely within 3 sec and stops with mild imbalance, requires small steps to catch balance.    Step Over Obstacle Is able to step over 2 stacked shoe boxes taped together (9 in total height) without changing gait speed. No evidence of imbalance.    Gait with Narrow Base of Support Ambulates 4-7 steps.    Gait with Eyes Closed Walks 20 ft, uses assistive device, slower speed, mild gait deviations, deviates 6-10 in outside 12 in walkway width. Ambulates 20 ft in less than 9 sec but greater than 7 sec.    Ambulating Backwards Walks 20 ft, uses assistive device, slower speed, mild gait deviations, deviates 6-10 in outside 12 in walkway width.    Steps Alternating feet, must use rail.   per pt's report   Total Score 21      RLE Tone   RLE Tone Within Functional Limits      LLE Tone   LLE Tone Within Functional Limits                        Objective measurements completed on examination: See above findings.                PT Education - 09/30/21 1159     Education Details prognosis, POC, HEP- Access Code [email or web address]    Person(s) Educated Patient    Methods Explanation;Demonstration;Tactile cues;Verbal cues;Handout    Comprehension Verbalized  understanding              PT Short Term Goals - 09/30/21 1205       PT SHORT TERM GOAL #1   Title Patient to be independent with initial HEP.    Time 3    Period Weeks    Status New    Target Date 10/21/21               PT Long Term Goals - 09/30/21 1205       PT LONG TERM GOAL #1   Title Patient to be independent with advanced HEP.    Time 6    Period Weeks    Status New    Target Date 11/11/21      PT LONG TERM GOAL #2   Title Patient to complete TUG in <14 sec with LRAD in order to decrease risk of falls.    Time 6    Period Weeks    Status New    Target Date 11/11/21      PT LONG TERM GOAL #3   Title Patient to score at least 23/30 on FGA in order to decrease risk of falls.    Time 6    Period Weeks    Status New    Target Date 11/11/21      PT LONG TERM GOAL #4   Title  Patient to demonstrate B LE strength >/=4+/5.    Time 6    Period Weeks    Status New    Target Date 11/11/21      PT LONG TERM GOAL #5   Title Patient will ambulate over outdoor surfaces with LRAD while performing head turns to scan environment with good stability in order to indicate safe community mobility.    Time 6    Period Weeks    Status New    Target Date 11/11/21                    Plan - 09/30/21 1200     Clinical Impression Statement Patient is an 80 y/o M presenting to [place] [facility] with c/o imbalance s/p multiple embolic strokes found on MRI 09/16/21. Also with incidental finding of cancerous liver lesions; continues to undergo workup for this. Patient bringing in [facility] today but not using it to ambulate. Notes imbalance with gait, with LOB to either direction but denies falls, weakness, N/T, dizziness. Patient is active for age and would like to return to golf safely. Patient today presenting with rounded posture, mild dysmetria with coordination testing, L>R LE weakness, gait deviations, and imbalance. Patient's score on FGA indicates an increased risk of falls.   Patient was educated on gentle strengthening and balance HEP and reported understanding. Prior to current episode, patient was independent. Would benefit from skilled PT services 2x/week for 3 weeks followed by 1/week for 3 weeks to address aforementioned impairments in order to optimize level of function.    Personal Factors and Comorbidities Age;Fitness;Comorbidity 3+;Time since onset of injury/illness/exacerbation;Past/Current Experience    Comorbidities DMT2, recent CVA, HTN, HLD    Examination-Activity Limitations Bathing;Locomotion Level;Transfers;Reach Overhead;Bend;Carry;Squat;Dressing;Stairs;Hygiene/Grooming;Stand;Lift    Examination-Participation Restrictions [employer];Shop;Community Activity;Cleaning;Church;Meal Prep    Stability/Clinical Decision Making Evolving/Moderate complexity    Clinical Decision Making Moderate    Rehab Potential Good    PT Frequency Other (comment)   2x/week for 3 weeks followed by 1/week for 3 weeks   PT Treatment/Interventions ADLs/Self Care Home Management;Canalith Repostioning;Cryotherapy;Electrical Stimulation;DME Instruction;Ultrasound;Moist Heat;Gait training;Stair training;Functional mobility training;Therapeutic activities;Therapeutic exercise;Balance training;Neuromuscular re-education;Manual techniques;Patient/family education;Passive range of motion;Dry needling;Energy conservation;Vestibular;Vasopneumatic Device;Taping    PT Next Visit Plan reassess HEP; progress L LE strengthening and higher level balance; gait training with SPC    Consulted and Agree with Plan of Care Patient             Patient will benefit from skilled therapeutic intervention in order to improve the following deficits and impairments:  Abnormal gait, Decreased coordination, Decreased range of motion, Decreased safety awareness, Decreased activity tolerance, Decreased balance, Impaired flexibility, Improper body mechanics, Postural dysfunction, Decreased  strength  Visit Diagnosis: Unsteadiness on feet  Muscle weakness (generalized)  Other abnormalities of gait and mobility  Abnormal posture     Problem List Patient Active Problem List   Diagnosis Date Noted   Chest pain 09/21/2021   Recent cerebrovascular accident (CVA) 09/21/2021   Interatrial cardiac shunt 09/21/2021   CKD (chronic kidney disease) stage 3, GFR 30-59 ml/min (HCC) 09/38/1829   Acute embolic stroke ([place]) 93/71/6967   Mixed diabetic hyperlipidemia associated with type 2 diabetes mellitus ([place]) 09/16/2021   Liver masses 09/16/2021   Essential hypertension 03/12/2012   Type 2 diabetes mellitus without complication, without long-term current use of insulin ([place]) 06/15/2008   BPH (benign prostatic hyperplasia) 06/15/2008     Janene Harvey, PT, DPT 09/30/21 12:10 PM   [facility] [address], [place], [place], [address] Phone: [phone]   Fax:  [phone]  Name: Robert Warner MRN: [id number] Date of Birth: 04-19-1941

## 2021-10-03 ENCOUNTER — Encounter: Payer: Self-pay | Admitting: *Deleted

## 2021-10-03 ENCOUNTER — Encounter: Payer: Self-pay | Admitting: Physical Therapy

## 2021-10-03 ENCOUNTER — Ambulatory Visit (INDEPENDENT_AMBULATORY_CARE_PROVIDER_SITE_OTHER): Payer: Medicare HMO | Admitting: Pharmacist

## 2021-10-03 ENCOUNTER — Other Ambulatory Visit: Payer: Self-pay

## 2021-10-03 ENCOUNTER — Ambulatory Visit: Payer: Medicare HMO | Admitting: Physical Therapy

## 2021-10-03 DIAGNOSIS — C801 Malignant (primary) neoplasm, unspecified: Secondary | ICD-10-CM | POA: Diagnosis not present

## 2021-10-03 DIAGNOSIS — E1169 Type 2 diabetes mellitus with other specified complication: Secondary | ICD-10-CM

## 2021-10-03 DIAGNOSIS — R2689 Other abnormalities of gait and mobility: Secondary | ICD-10-CM | POA: Diagnosis not present

## 2021-10-03 DIAGNOSIS — E119 Type 2 diabetes mellitus without complications: Secondary | ICD-10-CM

## 2021-10-03 DIAGNOSIS — C229 Malignant neoplasm of liver, not specified as primary or secondary: Secondary | ICD-10-CM | POA: Diagnosis not present

## 2021-10-03 DIAGNOSIS — E782 Mixed hyperlipidemia: Secondary | ICD-10-CM

## 2021-10-03 DIAGNOSIS — M6281 Muscle weakness (generalized): Secondary | ICD-10-CM

## 2021-10-03 DIAGNOSIS — I6310 Cerebral infarction due to embolism of unspecified precerebral artery: Secondary | ICD-10-CM | POA: Diagnosis not present

## 2021-10-03 DIAGNOSIS — R2681 Unsteadiness on feet: Secondary | ICD-10-CM

## 2021-10-03 DIAGNOSIS — R293 Abnormal posture: Secondary | ICD-10-CM | POA: Diagnosis not present

## 2021-10-03 DIAGNOSIS — R978 Other abnormal tumor markers: Secondary | ICD-10-CM | POA: Diagnosis not present

## 2021-10-03 NOTE — Therapy (Signed)
[facility] [address] [place], [place], [address] Phone: [phone]   Fax:  [phone]  Physical Therapy Treatment  Patient Details  Name: Robert Warner MRN: [id number] Date of Birth: 06-Dec-1940 Referring Provider (PT): Antonieta Pert, MD   Encounter Date: 10/03/2021   PT End of Session - 10/03/21 1235     Visit Number 2    Number of Visits 10    Date for PT Re-Evaluation 11/11/21    Authorization Type [employer]    Authorization Time Period 9 visits 11/18-12/30    Authorization - Visit Number 1    Authorization - Number of Visits 9    PT Start Time 4008    PT Stop Time 1230    PT Time Calculation (min) 42 min    Equipment Utilized During Treatment Gait belt    Activity Tolerance Patient tolerated treatment well    Behavior During Therapy [facility] for tasks assessed/performed;Impulsive             Past Medical History:  Diagnosis Date   Arthritis    Diabetes mellitus    Hyperlipidemia    Hypertension    Stroke [facility])     Past Surgical History:  Procedure Laterality Date   APPENDECTOMY  1968   CATARACT EXTRACTION Bilateral 07/2000   states 2 weeks ago (first of sept) OD due in Dec    COLONOSCOPY  2004,2009   Negative, Dr. Sharlett Iles    KIDNEY STONE SURGERY  10/2018   PROSTATE BIOPSY  2006   Dr.Sigmund Tannebaum    There were no vitals filed for this visit.   Subjective Assessment - 10/03/21 1149     Subjective Denies new falls. Still feels off balance but feels like he is making progress. Has a new scan scheduled with oncology.    Pertinent History DMT2, recent CVA, HTN, HLD    Diagnostic tests 09/16/21 brain MRI: Multiple small foci of restricted diffusion throughout the bilateral cerebral and cerebellar hemispheres, as well as the right thalamus,  consistent with acute infarcts. Additional subacute infarcts in the  vermis and left aspect of the splenium of the corpus callosum. Given  different vascular  territories, an embolic etiology is favored.    Patient Stated Goals improve balance    Currently in Pain? No/denies                [facility] PT Assessment - 10/03/21 0001       Standardized Balance Assessment   Standardized Balance Assessment Timed Up and Go Test      Timed Up and Go Test   Normal TUG (seconds) 11.9   no AD                          [facility] Adult PT Treatment/Exercise - 10/03/21 0001       Ambulation/Gait   Ambulation Distance (Feet) 86 Feet    Assistive device Straight cane    Gait Pattern Step-through pattern;Decreased stance time - left;Decreased stance time - right;Poor foot clearance - left;Poor foot clearance - right;Trunk flexed    Ambulation Surface Level;Indoor    Gait velocity WFL    Gait Comments gait training with SPC; cues for sequencing with good carryover      Exercises   Exercises Knee/Hip      Knee/Hip Exercises: Supine   Bridges with Clamshell Strengthening;Both;1 set;10 reps   red TB; cues to maintain hips neutral and void valsalva  Knee/Hip Exercises: Sidelying   Hip ABduction Strengthening;Right;Left;1 set;10 reps    Hip ABduction Limitations manual cues for positioning and alignment                 Balance Exercises - 10/03/21 0001       Balance Exercises: Standing   Standing Eyes Opened Narrow base of support (BOS);1 rep;30 secs;Limitations    Standing Eyes Opened Limitations in II bars    Standing Eyes Closed Narrow base of support (BOS);1 rep;30 secs;Limitations    Standing Eyes Closed Limitations in II bars    Tandem Gait Forward;Retro;4 reps   cueing to maintain EO; in II bars   Heel Raises Both;10 reps;Limitations    Heel Raises Limitations B heel/toe raise without UEs on II bar   limited toe raise in R   Other Standing Exercises ant/pos rockerboard static balance 30", wt shifts x15   intermittnet UE support   Other Standing Exercises Comments walking on heels/toes with intermittent UE support on  II bars 4x length of II bars each   more difficulty on heels               PT Education - 10/03/21 1234     Education Details review of HEP and provided notes for improved carryover; removed romberg d/t little challenge today; advised patient to use SPC with ambulation, especially is walking outside for exercise    Person(s) Educated Patient    Methods Explanation;Demonstration;Tactile cues;Verbal cues;Handout    Comprehension Verbalized understanding;Returned demonstration              PT Short Term Goals - 10/03/21 1238       PT SHORT TERM GOAL #1   Title Patient to be independent with initial HEP.    Time 3    Period Weeks    Status On-going    Target Date 10/21/21               PT Long Term Goals - 10/03/21 1239       PT LONG TERM GOAL #1   Title Patient to be independent with advanced HEP.    Time 6    Period Weeks    Status On-going      PT LONG TERM GOAL #2   Title Patient to complete TUG in <14 sec with LRAD in order to decrease risk of falls.    Time 6    Period Weeks    Status Achieved      PT LONG TERM GOAL #3   Title Patient to score at least 23/30 on FGA in order to decrease risk of falls.    Time 6    Period Weeks    Status On-going      PT LONG TERM GOAL #4   Title Patient to demonstrate B LE strength >/=4+/5.    Time 6    Period Weeks    Status On-going      PT LONG TERM GOAL #5   Title Patient will ambulate over outdoor surfaces with LRAD while performing head turns to scan environment with good stability in order to indicate safe community mobility.    Time 6    Period Weeks    Status On-going                   Plan - 10/03/21 1236     Clinical Impression Statement Patient arrived to session with report of feeling that he is making progress and reports compliance with  HEP. Patient demonstrated good gait speed with TUG testing, indicating a decreased risk of falls. Worked on gait training with SPC, providing cues  for AD sequencing with good carryover and gait speed demonstrated. Patient requited corrective cueing to maintain proper form with review of HEP. Requiring cues to stay on task and maintain carryover d/t intermittent difficulty maintaining attention. Balance exercises were performed with intermittent UE support. Patient with limited ankle and hip strategy, opting for reaching strategy today. Patient reported understanding of all edu provided today and without complaints at end of session.    Comorbidities DMT2, recent CVA, HTN, HLD    PT Treatment/Interventions ADLs/Self Care Home Management;Canalith Repostioning;Cryotherapy;Electrical Stimulation;DME Instruction;Ultrasound;Moist Heat;Gait training;Stair training;Functional mobility training;Therapeutic activities;Therapeutic exercise;Balance training;Neuromuscular re-education;Manual techniques;Patient/family education;Passive range of motion;Dry needling;Energy conservation;Vestibular;Vasopneumatic Device;Taping    PT Next Visit Plan progress L LE strengthening and higher level balance; gait training with SPC    Consulted and Agree with Plan of Care Patient             Patient will benefit from skilled therapeutic intervention in order to improve the following deficits and impairments:  Abnormal gait, Decreased coordination, Decreased range of motion, Decreased safety awareness, Decreased activity tolerance, Decreased balance, Impaired flexibility, Improper body mechanics, Postural dysfunction, Decreased strength  Visit Diagnosis: Unsteadiness on feet  Muscle weakness (generalized)  Other abnormalities of gait and mobility  Abnormal posture     Problem List Patient Active Problem List   Diagnosis Date Noted   Chest pain 09/21/2021   Recent cerebrovascular accident (CVA) 09/21/2021   Interatrial cardiac shunt 09/21/2021   CKD (chronic kidney disease) stage 3, GFR 30-59 ml/min ([place]) 95/63/8756   Acute embolic stroke ([place]) 43/32/9518    Mixed diabetic hyperlipidemia associated with type 2 diabetes mellitus ([place]) 09/16/2021   Liver masses 09/16/2021   Essential hypertension 03/12/2012   Type 2 diabetes mellitus without complication, without long-term current use of insulin ([place]) 06/15/2008   BPH (benign prostatic hyperplasia) 06/15/2008    Janene Harvey, PT, DPT 10/03/21 12:40 PM   [facility] [address], [place], [place], [address] Phone: [phone]   Fax:  [phone]  Name: Robert Warner MRN: [id number] Date of Birth: 06/06/1941

## 2021-10-03 NOTE — Patient Instructions (Addendum)
Hi Robert Warner,  It was great to catch up with you! Keep up the good work with checking your blood sugars regularly. You may want to try to check them at different times of the day just to see how much variation there is.  Please reach out to me if you have any questions or need anything before our follow up!  Best, Robert Warner  Robert Warner, PharmD, [facility] [place]   Visit Information   Goals Addressed   None    Patient Care Plan: CCM Pharmacy Care Plan     Problem Identified: Problem: Hyperlipidemia, Diabetes, BPH, and Glaucoma      Long-Range Goal: Patient-Specific Goal   Start Date: 05/23/2021  Expected End Date: 05/23/2022  Recent Progress: On track  Priority: High  Note:   Current Barriers:  Unable to independently monitor therapeutic efficacy Suboptimal therapeutic regimen for diabetes  Pharmacist Clinical Goal(s):  Patient will achieve adherence to monitoring guidelines and medication adherence to achieve therapeutic efficacy through collaboration with PharmD and provider.   Interventions: 1:1 collaboration with [name], MD regarding development and update of comprehensive plan of care as evidenced by provider attestation and co-signature Inter-disciplinary care team collaboration (see longitudinal plan of care) Comprehensive medication review performed; medication list updated in electronic medical record  Hyperlipidemia: (LDL goal < 70) -Uncontrolled -Current treatment: Atorvastatin 40 mg 1 tablet daily -Medications previously tried: pravastatin  -Current dietary patterns: eating a vegetable with every meal; does not eat much fried foods -Current exercise habits: at least 4 days a week - not going right now; doing PT right now -Educated on Cholesterol goals;  Importance of limiting foods high in cholesterol; -Counseled on diet and exercise extensively Recommended to continue current  medication  Diabetes (A1c goal <7%) -Controlled -Current medications: Metformin 750 mg XR 1 tablet daily with breakfast -Medications previously tried: metformin (higher dose of 500 mg twice daily caused stomach upset), Januvia (switch to Rybelsus), Rybelsus (possible side effects) -Current home glucose readings fasting glucose: 140-150 post prandial glucose: does not check -Denies hypoglycemic/hyperglycemic symptoms -Current meal patterns:  breakfast: 2% milk and cereal (raisin bran or cheerios and bananas); grits & eggs & sausage and cheese on Sundays lunch: salad sometimes; [place] and ham sandwich, potato chips dinner: picks it up Tues and Fri; chicken; catfish with potatoes and vegetables; longhorn ribeye with baked potato and salad; vegetable with dinner snacks: very seldom dessert, peanuts, dried raisins (doesn't eat after dinner) drinks: water, Gatorade, iced tea (sweet) -Current exercise: 4 days a week - on hold right now -Educated on A1c and blood sugar goals; Exercise goal of 150 minutes per week; Carbohydrate counting and/or plate method -Counseled to check feet daily and get yearly eye exams -Counseled on diet and exercise extensively Recommended to continue current medication  BPH (Goal: minimize symptoms) -Controlled -Current treatment  Silodosin 8 mg 1 capsule daily at bedtime -Medications previously tried: none  -Recommended to continue current medication  History of stroke (Goal: prevent strokes) -Controlled -Current treatment  Clopidogrel 75 mg 1 tablet daily Aspirin 81 mg 1 tablet daily Atorvastatin 40 mg 1 tablet daily -Medications previously tried: none  -Counseled on monitoring for bleeding and limiting use of NSAIDs.  Glaucoma (Goal: minimize intraocular pressure) -Controlled -Current treatment  Travatan Z 0.004% apply as directed -Medications previously tried: none  -Recommended to continue current medication   Health Maintenance -Vaccine gaps:  shingrix (patient got at the [place]), tetanus -Current therapy:  Vitamin D 2000 units  1 tablet daily Vitamin B12 1000 mcg 1 tablet daily -Educated on Cost vs benefit of each product must be carefully weighed by individual consumer -Patient is satisfied with current therapy and denies issues -Recommended to continue current medication  Patient Goals/Self-Care Activities Patient will:  - take medications as prescribed check glucose daily, document, and provide at future appointments target a minimum of 150 minutes of moderate intensity exercise weekly  Follow Up Plan: Telephone follow up appointment with care management team member scheduled for: 6 months       Patient verbalizes understanding of instructions provided today and agrees to view in [place].  Telephone follow up appointment with pharmacy team member scheduled for:6 months  Robert Warner, [facility]

## 2021-10-03 NOTE — Progress Notes (Signed)
Chronic Care Management Pharmacy Note  10/03/2021 Name:  Robert Warner MRN:  [id number] DOB:  17-Jun-1941  Summary: A1c at goal < 7% LDL not at goal < 70  Recommendations/Changes made from today's visit: -Recommend repeat lipid panel -Consider SGTL2 therapy or trial of alternative GLP1 depending on next A1c -Recommended alternating different times of day for checking blood sugars  Plan: Follow up in 1-2 months for DM assessment   Subjective: Robert Warner is an 80 y.o. year old male who is a primary patient of Burchette, Alinda Sierras, MD.  The CCM team was consulted for assistance with disease management and care coordination needs.    Engaged with patient by telephone for follow up visit in response to provider referral for pharmacy case management and/or care coordination services.   Consent to Services:  The patient was given information about Chronic Care Management services, agreed to services, and gave verbal consent prior to initiation of services.  Please see initial visit note for detailed documentation.   Patient Care Team: [name], MD as PCP - General (Family Medicine) [name], MD as Consulting Physician (Ophthalmology) Rosemary Holms, [facility] as Consulting Physician (Podiatry) Minor, Orlie Pollen, DDS (Dentistry) Armbruster, Carlota Raspberry, MD as Consulting Physician (Gastroenterology) Festus Aloe, MD as Consulting Physician (Urology) Viona Gilmore, [facility] as Pharmacist (Pharmacist) Lin Givens, RN as Oncology Nurse Navigator  Recent office visits: 09/26/21 [name], MD: Patient presented for hospitalization follow up. Influenza vaccine administered. Plan for biopsy and ordered urgent referral to oncology.  08/29/21 Burchette, Alinda Sierras, MD: Patient presented for type 2 diabetes follow up. D/c'd Rybelsus and consider GI referral if symptoms do not improve.  08/10/21 Randel Pigg, LPN: Patient presented for AWV.  05/04/21 [name], MD: Patient presented for type 2 diabetes follow up. Continue with current medications.  05/04/21 Burchette, Alinda Sierras, MD seen for type 2 diabetes follow up.  Recent consult visits: 09/29/21 Ned Card, NP (hematology/oncology): Patient presented for adenocarcinoma consult. Plan for PET scan.  01/27/21 Karen Kays, NP (urology): Unable to access notes.  12/08/20 [name] Ophthalmology seen for follow-up for angle glaucoma, bilateral, moderate stage.   Hospital visits: 11/8-11/10/22 Patient admitted to [facility] for chest pain.  Started aspirin and Plavix.  11/4-11/7/22 Patient admitted to [facility] for CVA. D/c'd pravastatin and started atorvastatin, and restarted aspirin and Plavix.   Objective:  Lab Results  Component Value Date   CREATININE 1.24 09/29/2021   BUN 21 09/29/2021   GFR 54.73 (L) 09/13/2021   GFRNONAA 58 (L) 09/29/2021   GFRAA 95 06/15/2008   NA 134 (L) 09/29/2021   K 4.8 09/29/2021   CALCIUM 10.0 09/29/2021   CO2 30 09/29/2021   GLUCOSE 153 (H) 09/29/2021    Lab Results  Component Value Date/Time   HGBA1C 6.9 (H) 09/17/2021 05:00 AM   HGBA1C 6.9 (A) 08/29/2021 08:38 AM   HGBA1C 6.3 (A) 05/04/2021 07:27 AM   HGBA1C 7.4 (H) 01/28/2018 09:13 AM   HGBA1C 7.4 07/04/2017 12:00 AM   GFR 54.73 (L) 09/13/2021 07:19 AM   GFR 57.78 (L) 01/31/2021 01:41 PM   MICROALBUR <0.7 01/31/2021 01:41 PM   MICROALBUR <0.7 07/07/2019 09:09 AM    Last diabetic Eye exam:  Lab Results  Component Value Date/Time   HMDIABEYEEXA No Retinopathy 08/10/2021 12:00 AM    Last diabetic Foot exam:  Lab Results  Component Value Date/Time   HMDIABFOOTEX normal 06/07/2015 12:00 AM  Lab Results  Component Value Date   CHOL 164 09/17/2021   HDL 57 09/17/2021   LDLCALC 97 09/17/2021   LDLDIRECT 64.7 03/24/2013   TRIG 49 09/17/2021   CHOLHDL 2.9 09/17/2021    Hepatic Function Latest Ref Rng & Units 09/29/2021  09/23/2021 09/18/2021  Total Protein 6.5 - 8.1 g/dL 7.7 7.3 6.3(L)  Albumin 3.5 - 5.0 g/dL 4.2 3.6 3.2(L)  AST 15 - 41 U/L 37 40 29  ALT 0 - 44 U/L 29 36 18  Alk Phosphatase 38 - 126 U/L 74 80 68  Total Bilirubin 0.3 - 1.2 mg/dL 0.6 1.0 0.9  Bilirubin, Direct 0.0 - 0.3 mg/dL - - -    Lab Results  Component Value Date/Time   TSH 1.25 10/15/2013 10:15 AM   TSH 0.86 07/26/2011 10:03 AM    CBC Latest Ref Rng & Units 09/23/2021 09/21/2021 09/20/2021  WBC 4.0 - 10.5 K/uL 5.0 5.1 5.9  Hemoglobin 13.0 - 17.0 g/dL 13.7 13.1 13.7  Hematocrit 39.0 - 52.0 % 41.7 40.3 41.8  Platelets 150 - 400 K/uL 181 195 187    No results found for: VD25OH  Clinical ASCVD: No  The ASCVD Risk score (Arnett DK, et al., 2019) failed to calculate for the following reasons:   The 2019 ASCVD risk score is only valid for ages 31 to 8   The patient has a prior MI or stroke diagnosis    Depression screen [facility] 2/9 08/29/2021 08/10/2021 08/10/2021  Decreased Interest 0 0 0  Down, Depressed, Hopeless 0 0 0  PHQ - 2 Score 0 0 0  Altered sleeping - - -  Tired, decreased energy - - -  Change in appetite - - -  Feeling bad or failure about yourself  - - -  Trouble concentrating - - -  Moving slowly or fidgety/restless - - -  Suicidal thoughts - - -  PHQ-9 Score - - -  Difficult doing work/chores - - -      Social History   Tobacco Use  Smoking Status Not on file  Smokeless Tobacco Never   BP Readings from Last 3 Encounters:  09/29/21 132/82  09/26/21 140/70  09/23/21 (!) 149/97   Pulse Readings from Last 3 Encounters:  09/29/21 82  09/26/21 65  09/23/21 67   Wt Readings from Last 3 Encounters:  09/29/21 194 lb 9.6 oz (88.3 kg)  09/26/21 193 lb 6.4 oz (87.7 kg)  09/21/21 194 lb 9.6 oz (88.3 kg)   BMI Readings from Last 3 Encounters:  09/29/21 27.92 kg/m  09/26/21 27.75 kg/m  09/21/21 27.92 kg/m    Assessment/Interventions: Review of patient past medical history, allergies, medications,  health status, including review of consultants reports, laboratory and other test data, was performed as part of comprehensive evaluation and provision of chronic care management services.   SDOH:  (Social Determinants of Health) assessments and interventions performed: No   SDOH Screenings   Alcohol Screen: Low Risk    Last Alcohol Screening Score (AUDIT): 0  Depression (PHQ2-9): Low Risk    PHQ-2 Score: 0  Financial Resource Strain: Low Risk    Difficulty of Paying Living Expenses: Not hard at all  Food Insecurity: No Food Insecurity   Worried About Charity fundraiser in the Last Year: Never true   Ran Out of Food in the Last Year: Never true  Housing: Low Risk    Last Housing Risk Score: 0  Physical Activity: Sufficiently Active   Days of Exercise  per Week: 3 days   Minutes of Exercise per Session: 60 min  Social Connections: Socially Integrated   Frequency of Communication with Friends and Family: Three times a week   Frequency of Social Gatherings with Friends and Family: Three times a week   Attends Religious Services: More than 4 times per year   Active Member of Clubs or Organizations: Yes   Attends Music therapist: More than 4 times per year   Marital Status: Married  Stress: No Stress Concern Present   Feeling of Stress : Not at all  Tobacco Use: Unknown   Smoking Tobacco Use: Never Assessed   Smokeless Tobacco Use: Never   Passive Exposure: Not on file  Transportation Needs: No Transportation Needs   Lack of Transportation (Medical): No   Lack of Transportation (Non-Medical): No    CCM Care Plan  No Known Allergies  Medications Reviewed Today     Reviewed by June Leap, PT (Physical Therapist) on 10/03/21 at 32  Med List Status: <None>   Medication Order Taking? Sig Documenting Provider Last Dose Status Informant  aspirin EC 81 MG tablet [id number] No Take 1 tablet (81 mg total) by mouth daily. Swallow whole. Antonieta Pert, MD Taking  Active   atorvastatin (LIPITOR) 40 MG tablet [id number] No Take 1 tablet (40 mg total) by mouth daily. Antonieta Pert, MD Taking Active   Cholecalciferol (VITAMIN D3) 2000 UNITS TABS [id number] No Take 2,000 Units by mouth daily. [provider] Taking Active Multiple Informants  clopidogrel (PLAVIX) 75 MG tablet [id number] No Take 1 tablet (75 mg total) by mouth daily. Start after conforming with IR Doctor after biopsy Antonieta Pert, MD Taking Active   glucose blood (FREESTYLE LITE) test strip [id number] No CHECK BLOOD SUGAR ONCE DAILY.  Dx:E11.9 [name], MD Taking Active Multiple Informants  Lancets (FREESTYLE) lancets [id number] No Check blood sugar once daily Hendricks Limes, MD Taking Active Multiple Informants  metFORMIN (GLUCOPHAGE-XR) 750 MG 24 hr tablet [id number] No TAKE 1 TABLET(750 MG) BY MOUTH DAILY WITH BREAKFAST  Patient taking differently: Take 750 mg by mouth daily with breakfast.   [name], MD Taking Active   pantoprazole (PROTONIX) 20 MG tablet [id number]  TAKE 1 TABLET(20 MG) BY MOUTH DAILY [name], NP  Active   silodosin (RAPAFLO) 8 MG CAPS capsule [id number] No Take 8 mg by mouth at bedtime. [provider] Taking Active Multiple Informants  TRAVATAN Z 0.004 % SOLN ophthalmic solution [id number] No Place 1 drop into both eyes at bedtime. [provider] Taking Active Multiple Informants            Patient Active Problem List   Diagnosis Date Noted   Chest pain 09/21/2021   Recent cerebrovascular accident (CVA) 09/21/2021   Interatrial cardiac shunt 09/21/2021   CKD (chronic kidney disease) stage 3, GFR 30-59 ml/min ([place]) 65/68/1275   Acute embolic stroke ([place]) 17/00/1749   Mixed diabetic hyperlipidemia associated with type 2 diabetes mellitus ([place]) 09/16/2021   Liver masses 09/16/2021   Essential hypertension 03/12/2012   Type 2 diabetes mellitus without complication, without long-term current use of insulin ([place]) 06/15/2008    BPH (benign prostatic hyperplasia) 06/15/2008    Immunization History  Administered Date(s) Administered   Fluad Quad(high Dose 65+) 07/07/2019, 08/25/2020, 09/26/2021   Influenza Whole 08/02/2009, 08/13/2012   Influenza, High Dose Seasonal PF 08/04/2013, 07/14/2016, 08/01/2017   Influenza,inj,Quad PF,6+ Mos 07/31/2014   PFIZER Comirnaty(Gray Top)Covid-19 Tri-Sucrose Vaccine 03/09/2021   PFIZER(Purple  Top)SARS-COV-2 Vaccination 11/26/2019, 12/17/2019, 08/17/2020   Pneumococcal Conjugate-13 06/07/2015   Pneumococcal Polysaccharide-23 10/15/2013   Td 06/15/2008   Zoster, Live 07/31/2014   Patient hasn't started the pantoprazole yet as the Tylenol has been helping with stomach pain.  Conditions to be addressed/monitored:  Hyperlipidemia, Diabetes, BPH, and Glaucoma  Conditions addressed this visit: Hyperlipidemia, diabetes   Care Plan : CCM Pharmacy Care Plan  Updates made by Viona Gilmore, [place] since 10/03/2021 12:00 AM     Problem: Problem: Hyperlipidemia, Diabetes, BPH, and Glaucoma      Long-Range Goal: Patient-Specific Goal   Start Date: 05/23/2021  Expected End Date: 05/23/2022  Recent Progress: On track  Priority: High  Note:   Current Barriers:  Unable to independently monitor therapeutic efficacy Suboptimal therapeutic regimen for diabetes  Pharmacist Clinical Goal(s):  Patient will achieve adherence to monitoring guidelines and medication adherence to achieve therapeutic efficacy through collaboration with PharmD and provider.   Interventions: 1:1 collaboration with [name], MD regarding development and update of comprehensive plan of care as evidenced by provider attestation and co-signature Inter-disciplinary care team collaboration (see longitudinal plan of care) Comprehensive medication review performed; medication list updated in electronic medical record  Hyperlipidemia: (LDL goal < 70) -Uncontrolled -Current treatment: Atorvastatin 40  mg 1 tablet daily -Medications previously tried: pravastatin  -Current dietary patterns: eating a vegetable with every meal; does not eat much fried foods -Current exercise habits: at least 4 days a week - not going right now; doing PT right now -Educated on Cholesterol goals;  Importance of limiting foods high in cholesterol; -Counseled on diet and exercise extensively Recommended to continue current medication  Diabetes (A1c goal <7%) -Controlled -Current medications: Metformin 750 mg XR 1 tablet daily with breakfast -Medications previously tried: metformin (higher dose of 500 mg twice daily caused stomach upset), Januvia (switch to Rybelsus), Rybelsus (possible side effects) -Current home glucose readings fasting glucose: 140-150 post prandial glucose: does not check -Denies hypoglycemic/hyperglycemic symptoms -Current meal patterns:  breakfast: 2% milk and cereal (raisin bran or cheerios and bananas); grits & eggs & sausage and cheese on Sundays lunch: salad sometimes; [place] and ham sandwich, potato chips dinner: picks it up Tues and Fri; chicken; catfish with potatoes and vegetables; longhorn ribeye with baked potato and salad; vegetable with dinner snacks: very seldom dessert, peanuts, dried raisins (doesn't eat after dinner) drinks: water, Gatorade, iced tea (sweet) -Current exercise: 4 days a week - on hold right now -Educated on A1c and blood sugar goals; Exercise goal of 150 minutes per week; Carbohydrate counting and/or plate method -Counseled to check feet daily and get yearly eye exams -Counseled on diet and exercise extensively Recommended to continue current medication  BPH (Goal: minimize symptoms) -Controlled -Current treatment  Silodosin 8 mg 1 capsule daily at bedtime -Medications previously tried: none  -Recommended to continue current medication  History of stroke (Goal: prevent strokes) -Controlled -Current treatment  Clopidogrel 75 mg 1 tablet  daily Aspirin 81 mg 1 tablet daily Atorvastatin 40 mg 1 tablet daily -Medications previously tried: none  -Counseled on monitoring for bleeding and limiting use of NSAIDs.  Glaucoma (Goal: minimize intraocular pressure) -Controlled -Current treatment  Travatan Z 0.004% apply as directed -Medications previously tried: none  -Recommended to continue current medication   Health Maintenance -Vaccine gaps: shingrix (patient got at the [place]), tetanus -Current therapy:  Vitamin D 2000 units 1 tablet daily Vitamin B12 1000 mcg 1 tablet daily -Educated on Cost vs benefit of each product must  be carefully weighed by individual consumer -Patient is satisfied with current therapy and denies issues -Recommended to continue current medication  Patient Goals/Self-Care Activities Patient will:  - take medications as prescribed check glucose daily, document, and provide at future appointments target a minimum of 150 minutes of moderate intensity exercise weekly  Follow Up Plan: Telephone follow up appointment with care management team member scheduled for: 6 months      Medication Assistance: None required.  Patient affirms current coverage meets needs.  Compliance/Adherence/Medication fill history: Care Gaps: Shingrix, tetanus, COVID booster, foot exam Last A1c: 6.9% 09/17/21  Star-Rating Drugs: Atorvastatin (Lipitor) 40 mg - Last filled 09/24/21 30 DS at [facility] Metformin 750 mg - Last filled 09/05/21 90 DS at [facility] Rybelsus 7 mg - Last filled 08/31/21 90 DS at [facility]  Patient's preferred pharmacy is:  [facility], [address] AT [place] [address] Phone: [phone] Fax: [phone]  Uses pill box? Yes - has an AM and PM - bought a box but some are before and some are after Pt endorses 100% compliance  We discussed: Benefits of medication synchronization, packaging and  delivery as well as enhanced pharmacist oversight with [employer]. Patient decided to: Continue current medication management strategy  Care Plan and Follow Up Patient Decision:  Patient agrees to Care Plan and Follow-up.  Plan: Telephone follow up appointment with care management team member scheduled for:  6 months  [name], PharmD, [place] Pharmacist [place] [facility] [place] [phone]

## 2021-10-04 ENCOUNTER — Telehealth: Payer: Self-pay | Admitting: *Deleted

## 2021-10-04 NOTE — Telephone Encounter (Signed)
Notified Robert Warner that he may increase the Protonix to 40 mg daily as needed. OK to use Maalox prn as well.

## 2021-10-10 ENCOUNTER — Other Ambulatory Visit: Payer: Self-pay

## 2021-10-10 ENCOUNTER — Encounter (HOSPITAL_COMMUNITY)
Admission: RE | Admit: 2021-10-10 | Discharge: 2021-10-10 | Disposition: A | Discharge status: Home / Self Care | Payer: Medicare HMO | Source: Ambulatory Visit | Attending: Nurse Practitioner | Admitting: Nurse Practitioner

## 2021-10-10 DIAGNOSIS — C229 Malignant neoplasm of liver, not specified as primary or secondary: Secondary | ICD-10-CM | POA: Diagnosis not present

## 2021-10-10 DIAGNOSIS — C787 Secondary malignant neoplasm of liver and intrahepatic bile duct: Secondary | ICD-10-CM | POA: Diagnosis not present

## 2021-10-10 LAB — GLUCOSE, CAPILLARY: Glucose-Capillary: 133 mg/dL — ABNORMAL HIGH (ref 70–99)

## 2021-10-10 IMAGING — CT NM PET TUM IMG INITIAL (PI) SKULL BASE T - THIGH
1 of 8 series · 1 of 25 positions shown · non-contrast
Comparison: MRI abdomen [DATE]

CLINICAL DATA: Initial treatment strategy for metastatic
adenocarcinoma of the liver. Unknown primary.

EXAM:
NUCLEAR MEDICINE PET SKULL BASE TO THIGH
TECHNIQUE: 9.49 mCi F-18 FDG was injected intravenously. Full-ring PET imaging
was performed from the skull base to thigh after the radiotracer. CT
data was obtained and used for attenuation correction and anatomic
localization.
Fasting blood glucose: 133 mg/dl

[Series 4: ct sk_thigh 5.0 bf37 · axial · 5.0mm · 0.98mm/px · 1 of 232 slices shown]
[im 232/232  brain]
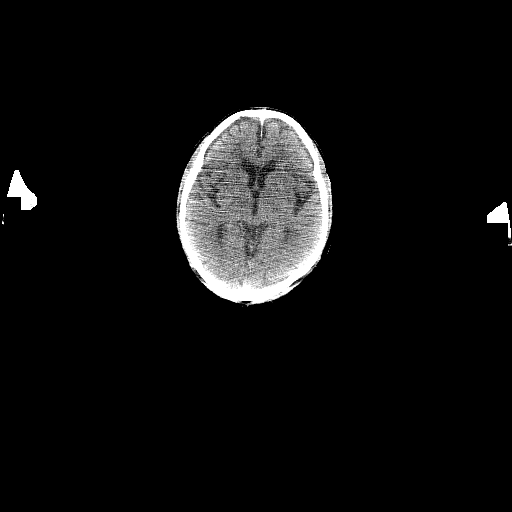

[1 of 25 positions shown; findings below may reference images not displayed]

FINDINGS: Mediastinal blood pool activity: SUV max

Liver activity: SUV max NA

NECK: No hypermetabolic lymph nodes in the neck.

Incidental CT findings: none

CHEST: Solitary 10 mm right upper lobe pulmonary nodule demonstrates
minimal FDG uptake with SUV max of 1.63. This is an indeterminate
finding but unlikely a primary lung neoplasm. Possible metastatic
focus.

7.5 mm left internal lymph node on image number 62/4 is
hypermetabolic with SUV max of 4.62. No enlarged or hypermetabolic
mediastinal or hilar lymph nodes. No chest wall mass,
supraclavicular or axillary adenopathy.

Incidental CT findings: Mild tortuosity and fusiform aneurysmal
dilatation of the descending thoracic aorta with maximum diameter
3.6 cm no atherosclerotic calcifications. No obvious coronary artery
calcifications.

ABDOMEN/PELVIS: Numerous hepatic lesions are identified as seen on
the prior MR examination. Larger coalescent lesion or possible
primary lesion in the segment 5 area with SUV max of 10.33. Multiple
other lesions are identified with SUV max ranging from 8.0 to 9.1.

Periportal lymphadenopathy. CS axis node measures 17 mm and has an
SUV max of 8.18.

Two adjacent lesions are noted in the mesentery in the right mid
abdomen with somewhat irregular margins. Maximum measurement of the
2 lesions is 3 cm on image 118/4. SUV max is 8.09. I do not see any
calcifications and carcinoid tumor is unlikely given the pathology
of liver lesions.

No hypermetabolic colonic lesion is identified to suggest a primary
colon cancer. No pancreatic lesion. No gastric lesion. No
retroperitoneal lymphadenopathy. Adrenal glands are unremarkable.

Incidental CT findings: Stable large right renal cyst. Incidental
Urolift device noted in the prostate gland. No hypermetabolic
prostate lesion.

SKELETON: No findings suspicious for osseous metastatic disease.

Incidental CT findings: none
IMPRESSION: 1. Numerous hepatic lesions are hypermetabolic as detailed above.
There is also hypermetabolic periportal lymphadenopathy. No obvious
primary lesion identified in the colon, stomach or bowel.
2. 3 cm mesenteric "mass" in the right mid abdomen most likely
metastatic adenopathy.
3. Solitary right upper lobe pulmonary nodule is weakly
hypermetabolic and could represent a metastatic focus. There is also
a hypermetabolic left internal mammary lymph node.
4. Fusiform aneurysmal dilatation of the descending thoracic aorta
with maximum measurement of 3.5 cm.

## 2021-10-10 MED ORDER — FLUDEOXYGLUCOSE F - 18 (FDG) INJECTION
9.0000 | Freq: Once | INTRAVENOUS | Status: AC | PRN
  Administered 2021-10-10: 13:00:00 9.49 via INTRAVENOUS

## 2021-10-11 ENCOUNTER — Encounter: Payer: Self-pay | Admitting: Physical Therapy

## 2021-10-11 ENCOUNTER — Encounter: Payer: Self-pay | Admitting: Nurse Practitioner

## 2021-10-11 ENCOUNTER — Ambulatory Visit: Payer: Medicare HMO | Admitting: Physical Therapy

## 2021-10-11 ENCOUNTER — Inpatient Hospital Stay (HOSPITAL_BASED_OUTPATIENT_CLINIC_OR_DEPARTMENT_OTHER): Payer: Medicare HMO | Admitting: Nurse Practitioner

## 2021-10-11 ENCOUNTER — Encounter: Payer: Self-pay | Admitting: *Deleted

## 2021-10-11 VITALS — BP 115/84 | HR 88 | Temp 98.1°F | Resp 18 | Ht 70.0 in | Wt 192.6 lb

## 2021-10-11 DIAGNOSIS — C801 Malignant (primary) neoplasm, unspecified: Secondary | ICD-10-CM | POA: Insufficient documentation

## 2021-10-11 DIAGNOSIS — R293 Abnormal posture: Secondary | ICD-10-CM | POA: Diagnosis not present

## 2021-10-11 DIAGNOSIS — Z7189 Other specified counseling: Secondary | ICD-10-CM | POA: Diagnosis not present

## 2021-10-11 DIAGNOSIS — R2681 Unsteadiness on feet: Secondary | ICD-10-CM

## 2021-10-11 DIAGNOSIS — M6281 Muscle weakness (generalized): Secondary | ICD-10-CM

## 2021-10-11 DIAGNOSIS — I6310 Cerebral infarction due to embolism of unspecified precerebral artery: Secondary | ICD-10-CM | POA: Diagnosis not present

## 2021-10-11 DIAGNOSIS — R2689 Other abnormalities of gait and mobility: Secondary | ICD-10-CM | POA: Diagnosis not present

## 2021-10-11 DIAGNOSIS — C229 Malignant neoplasm of liver, not specified as primary or secondary: Secondary | ICD-10-CM | POA: Diagnosis not present

## 2021-10-11 DIAGNOSIS — R978 Other abnormal tumor markers: Secondary | ICD-10-CM | POA: Diagnosis not present

## 2021-10-11 MED ORDER — TRAMADOL HCL 50 MG PO TABS: 50.0000 mg | ORAL_TABLET | Freq: Three times a day (TID) | ORAL | 0 refills | Status: DC | PRN

## 2021-10-11 NOTE — Therapy (Signed)
[facility] [place] [address] [place], [place], [address] Phone: [phone]   Fax:  [phone]  Physical Therapy Treatment  Patient Details  Name: Robert Warner MRN: [id number] Date of Birth: Jan 14, 1941 Referring Provider (PT): Antonieta Pert, MD   Encounter Date: 10/11/2021   PT End of Session - 10/11/21 1112     Visit Number 3    Number of Visits 10    Date for PT Re-Evaluation 11/11/21    Authorization Type [employer]    Authorization Time Period 9 visits 11/18-12/30    Authorization - Visit Number 2    Authorization - Number of Visits 9    PT Start Time 1017    PT Stop Time 1059    PT Time Calculation (min) 42 min    Equipment Utilized During Treatment Gait belt    Activity Tolerance Patient tolerated treatment well    Behavior During Therapy [facility] for tasks assessed/performed             Past Medical History:  Diagnosis Date   Arthritis    Diabetes mellitus    Hyperlipidemia    Hypertension    Stroke [facility])     Past Surgical History:  Procedure Laterality Date   APPENDECTOMY  1968   CATARACT EXTRACTION Bilateral 07/2000   states 2 weeks ago (first of sept) OD due in Dec    COLONOSCOPY  2004,2009   Negative, Dr. Sharlett Iles    KIDNEY STONE SURGERY  10/2018   PROSTATE BIOPSY  2006   Dr.Sigmund Tannebaum    There were no vitals filed for this visit.   Subjective Assessment - 10/11/21 1021     Subjective Meeting with oncologist about his PET scan results today. Feeling good. Denies recent falls. Has been using SPC.    Pertinent History DMT2, recent CVA, HTN, HLD    Diagnostic tests 09/16/21 brain MRI: Multiple small foci of restricted diffusion throughout the bilateral cerebral and cerebellar hemispheres, as well as the right thalamus,  consistent with acute infarcts. Additional subacute infarcts in the  vermis and left aspect of the splenium of the corpus callosum. Given  different vascular territories, an embolic  etiology is favored.    Patient Stated Goals improve balance    Currently in Pain? No/denies                               [facility] Adult PT Treatment/Exercise - 10/11/21 0001       Ambulation/Gait   Ambulation Distance (Feet) --   47ft indoors, 200 ft outdoors on grassy hills   Assistive device Straight cane    Gait Pattern Step-through pattern;Decreased stance time - left;Decreased stance time - right;Poor foot clearance - left;Poor foot clearance - right;Trunk flexed    Ambulation Surface Level;Indoor    Gait velocity WFL    Gait Comments gait training with SPC; reminder on proper SPC sequencing with good carryover after practice; adjusted SPC height; gait with and without SPC outdoors while maintaining conversation- no instability evident      Knee/Hip Exercises: Stretches   Gastroc Stretch Right;Left;1 rep;30 seconds    Gastroc Stretch Limitations each LE    Other Knee/Hip Stretches R/L sitting fig 4 and KTOS with self OP and cues to avoid pushing into pain 30" each      Knee/Hip Exercises: Aerobic   Nustep L5 x 6 min (UEs/LEs)      Knee/Hip  Exercises: Standing   Other Standing Knee Exercises sidestepping with red TB around ankles 4x62ft                 Balance Exercises - 10/11/21 0001       Balance Exercises: Standing   Tandem Gait Forward;Retro;4 reps;Limitations    Tandem Gait Limitations cues to touch heel to toe- improved stability following    Other Standing Exercises Comments alt toe tap on cone 10x, 2x20 on foam   minor UE support               PT Education - 10/11/21 1111     Education Details discussion on treatment plan and therapy goals; advised patient to continue using SPC with ambulation    Person(s) Educated Patient    Methods Explanation;Demonstration;Tactile cues;Verbal cues;Handout    Comprehension Verbalized understanding;Returned demonstration              PT Short Term Goals - 10/03/21 1238       PT  SHORT TERM GOAL #1   Title Patient to be independent with initial HEP.    Time 3    Period Weeks    Status On-going    Target Date 10/21/21               PT Long Term Goals - 10/03/21 1239       PT LONG TERM GOAL #1   Title Patient to be independent with advanced HEP.    Time 6    Period Weeks    Status On-going      PT LONG TERM GOAL #2   Title Patient to complete TUG in <14 sec with LRAD in order to decrease risk of falls.    Time 6    Period Weeks    Status Achieved      PT LONG TERM GOAL #3   Title Patient to score at least 23/30 on FGA in order to decrease risk of falls.    Time 6    Period Weeks    Status On-going      PT LONG TERM GOAL #4   Title Patient to demonstrate B LE strength >/=4+/5.    Time 6    Period Weeks    Status On-going      PT LONG TERM GOAL #5   Title Patient will ambulate over outdoor surfaces with LRAD while performing head turns to scan environment with good stability in order to indicate safe community mobility.    Time 6    Period Weeks    Status On-going                   Plan - 10/11/21 1112     Clinical Impression Statement Patient arrived to session with report using SPC at home and denies falls. Reviewed gait training with SPC with initial trouble with AD sequencing; improved with practice. Patient reported instability with balance exercises on compliant surface. Able to work on resisted hip strengthening in standing with CGA and intermittent min A d/t imbalance. Patient reports plans to return to golf next week, thus trialed gait training with and without AD outside. Patient was able to maintain conversation while ambulating on grassy surface without evident instability. Plan to continue reassessing and challenging balance in order to determine safe return to golf. Patient reported understanding and without complaints at end of session.    Comorbidities DMT2, recent CVA, HTN, HLD    PT Treatment/Interventions ADLs/Self  Care Home Management;Canalith  Repostioning;Cryotherapy;Electrical Stimulation;DME Instruction;Ultrasound;Moist Heat;Gait training;Stair training;Functional mobility training;Therapeutic activities;Therapeutic exercise;Balance training;Neuromuscular re-education;Manual techniques;Patient/family education;Passive range of motion;Dry needling;Energy conservation;Vestibular;Vasopneumatic Device;Taping    PT Next Visit Plan progress L LE strengthening and higher level balance; gait training with/without SPC outside    Consulted and Agree with Plan of Care Patient             Patient will benefit from skilled therapeutic intervention in order to improve the following deficits and impairments:  Abnormal gait, Decreased coordination, Decreased range of motion, Decreased safety awareness, Decreased activity tolerance, Decreased balance, Impaired flexibility, Improper body mechanics, Postural dysfunction, Decreased strength  Visit Diagnosis: Unsteadiness on feet  Muscle weakness (generalized)  Other abnormalities of gait and mobility  Abnormal posture     Problem List Patient Active Problem List   Diagnosis Date Noted   Chest pain 09/21/2021   Recent cerebrovascular accident (CVA) 09/21/2021   Interatrial cardiac shunt 09/21/2021   CKD (chronic kidney disease) stage 3, GFR 30-59 ml/min (HCC) 65/78/4696   Acute embolic stroke ([place]) 29/52/8413   Mixed diabetic hyperlipidemia associated with type 2 diabetes mellitus ([place]) 09/16/2021   Liver masses 09/16/2021   Essential hypertension 03/12/2012   Type 2 diabetes mellitus without complication, without long-term current use of insulin ([place]) 06/15/2008   BPH (benign prostatic hyperplasia) 06/15/2008     Janene Harvey, PT, DPT 10/11/21 11:17 AM   [facility] [address], [place], [place], [address] Phone: [phone]   Fax:  [phone]  Name: Robert Warner MRN: [id number] Date  of Birth: [birth year]

## 2021-10-11 NOTE — Progress Notes (Signed)
[facility] OFFICE PROGRESS NOTE   Diagnosis: Adenocarcinoma of the liver  INTERVAL HISTORY:   Mr. Gnau returns as scheduled.  He reports postprandial abdominal pain, severe at times.  He takes Tylenol for relief.  His wife reports the pain periodically "doubles him over".  No nausea or vomiting.  Bowels moving.  Objective:  Vital signs in last 24 hours:  Blood pressure 115/84, pulse 88, temperature 98.1 F (36.7 C), temperature source Oral, resp. rate 18, height 5\' 10"  (1.778 m), weight 192 lb 9.6 oz (87.4 kg), SpO2 100 %.    Lymphatics: No palpable axillary lymph nodes. Resp: Lungs clear bilaterally. Cardio: Regular rate and rhythm. GI: No hepatomegaly. Vascular: No leg edema. Breast: No palpable breast mass.   Lab Results:  Lab Results  Component Value Date   WBC 5.0 09/23/2021   HGB 13.7 09/23/2021   HCT 41.7 09/23/2021   MCV 85.6 09/23/2021   PLT 181 09/23/2021   NEUTROABS 2.5 09/17/2021    Imaging:  NM PET Image Initial (PI) Skull Base To Thigh  Result Date: 10/11/2021 CLINICAL DATA:  Initial treatment strategy for metastatic adenocarcinoma of the liver. Unknown primary. EXAM: NUCLEAR MEDICINE PET SKULL BASE TO THIGH TECHNIQUE: 9.49 mCi F-18 FDG was injected intravenously. Full-ring PET imaging was performed from the skull base to thigh after the radiotracer. CT data was obtained and used for attenuation correction and anatomic localization. Fasting blood glucose: 133 mg/dl COMPARISON:  MRI abdomen 09/03/2021 FINDINGS: Mediastinal blood pool activity: SUV max 2.41 Liver activity: SUV max NA NECK: No hypermetabolic lymph nodes in the neck. Incidental CT findings: none CHEST: Solitary 10 mm right upper lobe pulmonary nodule demonstrates minimal FDG uptake with SUV max of 1.63. This is an indeterminate finding but unlikely a primary lung neoplasm. Possible metastatic focus. 7.5 mm left internal lymph node on image number 00/9 is hypermetabolic with SUV  max of 4.62. No enlarged or hypermetabolic mediastinal or hilar lymph nodes. No chest wall mass, supraclavicular or axillary adenopathy. Incidental CT findings: Mild tortuosity and fusiform aneurysmal dilatation of the descending thoracic aorta with maximum diameter 3.6 cm no atherosclerotic calcifications. No obvious coronary artery calcifications. ABDOMEN/PELVIS: Numerous hepatic lesions are identified as seen on the prior MR examination. Larger coalescent lesion or possible primary lesion in the segment 5 area with SUV max of 10.33. Multiple other lesions are identified with SUV max ranging from 8.0 to 9.1. Periportal lymphadenopathy. CS axis node measures 17 mm and has an SUV max of 8.18. Two adjacent lesions are noted in the mesentery in the right mid abdomen with somewhat irregular margins. Maximum measurement of the 2 lesions is 3 cm on image 118/4. SUV max is 8.09. I do not see any calcifications and carcinoid tumor is unlikely given the pathology of liver lesions. No hypermetabolic colonic lesion is identified to suggest a primary colon cancer. No pancreatic lesion. No gastric lesion. No retroperitoneal lymphadenopathy. Adrenal glands are unremarkable. Incidental CT findings: Stable large right renal cyst. Incidental Urolift device noted in the prostate gland. No hypermetabolic prostate lesion. SKELETON: No findings suspicious for osseous metastatic disease. Incidental CT findings: none IMPRESSION: 1. Numerous hepatic lesions are hypermetabolic as detailed above. There is also hypermetabolic periportal lymphadenopathy. No obvious primary lesion identified in the colon, stomach or bowel. 2. 3 cm mesenteric "mass" in the right mid abdomen most likely metastatic adenopathy. 3. Solitary right upper lobe pulmonary nodule is weakly hypermetabolic and could represent a metastatic focus. There is also a hypermetabolic left  internal mammary lymph node. 4. Fusiform aneurysmal dilatation of the descending thoracic  aorta with maximum measurement of 3.5 cm. Electronically Signed   By: Marijo Sanes M.D.   On: 10/11/2021 08:56    Medications: I have reviewed the patient's current medications.  Assessment/Plan: Poorly differentiated adenocarcinoma involving the liver Abdominal ultrasound 08/05/2021-indeterminate 69mm solid mass in the right hepatic lobe.   MRI of the liver 09/06/2021-multifocal rim-enhancing lesions throughout both lobes of the liver.  Index lesion within the lateral dome of right hepatic lobe measures 1.1 cm, segment 4A lesion measures 1.6 x 1.4 cm, segment 2 lesion measures 0.8 x 0.7 cm, posteromedial margin of the right hepatic lobe subcapsular lesion measures 2.5 x 2.0 cm 09/16/2021 CT angio neck-9 mm right upper lobe nodule Biopsy of a right liver mass on 09/23/2021.  Pathology shows poorly differentiated adenocarcinoma positive for cytokeratin 7, CDX2 and cytokeratin 20 and negative for TTF-1, PSA and prostein.  Differential diagnoses include pancreatobiliary and less likely upper GI. 09/29/2021 CA 19-9 81, CEA 33 PET scan 10/10/2021-solitary 10 mm right upper lobe pulmonary nodule with minimal FDG uptake.  7.5 mm left internal mammary lymph node hypermetabolic with SUV max 0.81.  Numerous hepatic lesions.  Periportal lymphadenopathy SUV max 8.18.  2 adjacent lesions noted in the mesentery in the right mid abdomen with somewhat irregular margins, SUV 8.09.  No hypermetabolic colonic lesion identified to suggest a primary colon cancer.  No pancreatic lesion.  No gastric lesion.  No retroperitoneal lymphadenopathy. Hospital admission 09/16/2021 - 09/19/2021 with gait disturbance-multifocal embolic stroke on MRI 44/06/1855, echo with interatrial shunt, transcranial Doppler with bubble study indicative of a medium size right to left shunt, lower extremity Doppler studies negative for DVT Hospital admission 09/20/2021 - 09/22/2021 with chest pain-CT coronary with no acute findings.    Diabetes Hypertension BPH Chronic kidney disease  Disposition: [name] has been diagnosed with adenocarcinoma of unknown primary metastatic to the liver.  [name] reviewed the diagnosis, prognosis and treatment options with [name] and his wife at today's appointment.  PET scan report/images reviewed with them as well.  They understand that no therapy will be curative.    A GI primary is suspected.  He is experiencing abdominal pain after eating.  Referral made to gastroenterology to consider an upper endoscopy.  Prescription sent to his pharmacy for tramadol as needed.  He understands he should not drive while taking pain medication.  He will begin a full liquid diet.  Referral made to the [facility] dietitian.  [name] recommends beginning treatment on the FOLFOX regimen.  We had a preliminary discussion regarding the chemotherapy today.  He understands a Port-A-Cath is necessary.  We made a referral to Interventional Radiology.  He will attend a chemotherapy education class.  End-of-life issues/CODE STATUS reviewed.  He would like to be placed on NO CODE BLUE status.  Anticipate he will begin the first cycle of FOLFOX 10/19/2021.  We will see him in follow-up prior to treatment that day.  He will contact the office in the interim with any problems.  Patient seen with [name].    Ned Card ANP/GNP-BC   10/11/2021  11:37 AM This was a shared visit with Ned Card.  Mr. Sawa was interviewed and examined.  We reviewed the PET findings and images with Mr. Stoneking and his wife.  He has been diagnosed with a poorly differentiated adenocarcinoma involving the liver biopsy.  There are multiple liver metastases and upper abdominal adenopathy.  No apparent primary tumor site on the staging PET scan.  He most likely has an upper GI primary or cholangiocarcinoma.  We will make a referral to GI to consider an upper endoscopy to rule out a gastroesophageal, gastric, or  duodenal primary.  I recommend palliative chemotherapy with FOLFOX.  We refer him for Port-A-Cath placement.  He will attend a chemotherapy teaching class.  The plan is to initiate FOLFOX on 10/19/2021.  A chemotherapy plan was entered today.  I was present for greater than 50% of today's visit.  I performed medical decision making.  Julieanne Manson, MD

## 2021-10-11 NOTE — Progress Notes (Signed)
START OFF PATHWAY REGIMEN - Other   [id number]:[id number] (Leucovorin IV D1 + Fluorouracil IV D1/CIV D1,2 + Oxaliplatin IV D1) q14 Days:   A cycle is every 14 days:     Oxaliplatin      Leucovorin      Fluorouracil      Fluorouracil   **Always confirm dose/schedule in your pharmacy ordering system**  Patient Characteristics: Intent of Therapy: Non-Curative / Palliative Intent, Discussed with Patient 

## 2021-10-11 NOTE — Progress Notes (Signed)
PATIENT NAVIGATOR PROGRESS NOTE  Name: Robert Warner Date: 10/11/2021 MRN: [id number]  DOB: [birth year]   Reason for visit:  Met with Mr and Mrs Champine during Ned Card NP and Dr Benay Spice visit  Comments:  Discussed nutrition and gave pt and his wife "Eating Hints" book and went through it together. Education on [facility] placement and what to expect as well as education on FOLFOX therapy which is set to begin on 12/7. Patient ed session scheduled for 12/5. Referrals ordered for IR PAC placement, EGD with [facility] consult. Will keep pt and his wife updated with appts as they are scheduled. We discussed a stage IV diagnosis and the feelings surrounding that diagnosis. Continue to provide supportive presence.    Time spent counseling/coordinating care: > 60 minutes

## 2021-10-12 ENCOUNTER — Other Ambulatory Visit: Payer: Self-pay

## 2021-10-12 ENCOUNTER — Encounter (HOSPITAL_COMMUNITY): Payer: Self-pay | Admitting: Oncology

## 2021-10-12 ENCOUNTER — Ambulatory Visit: Payer: Medicare HMO | Attending: Internal Medicine

## 2021-10-12 ENCOUNTER — Other Ambulatory Visit (HOSPITAL_BASED_OUTPATIENT_CLINIC_OR_DEPARTMENT_OTHER): Payer: Self-pay

## 2021-10-12 ENCOUNTER — Telehealth: Payer: Self-pay | Admitting: Gastroenterology

## 2021-10-12 ENCOUNTER — Encounter: Payer: Self-pay | Admitting: Oncology

## 2021-10-12 ENCOUNTER — Encounter: Payer: Self-pay | Admitting: *Deleted

## 2021-10-12 ENCOUNTER — Telehealth: Payer: Self-pay

## 2021-10-12 DIAGNOSIS — E1169 Type 2 diabetes mellitus with other specified complication: Secondary | ICD-10-CM | POA: Diagnosis not present

## 2021-10-12 DIAGNOSIS — R160 Hepatomegaly, not elsewhere classified: Secondary | ICD-10-CM

## 2021-10-12 DIAGNOSIS — Z23 Encounter for immunization: Secondary | ICD-10-CM

## 2021-10-12 DIAGNOSIS — E782 Mixed hyperlipidemia: Secondary | ICD-10-CM

## 2021-10-12 DIAGNOSIS — E119 Type 2 diabetes mellitus without complications: Secondary | ICD-10-CM

## 2021-10-12 DIAGNOSIS — C801 Malignant (primary) neoplasm, unspecified: Secondary | ICD-10-CM

## 2021-10-12 MED ORDER — PFIZER COVID-19 VAC BIVALENT 30 MCG/0.3ML IM SUSP
INTRAMUSCULAR | 0 refills | Status: DC
  Filled 2021-10-12: qty 0.3, 1d supply, fill #0

## 2021-10-12 NOTE — Telephone Encounter (Signed)
Called and spoke with patient in regards to recommendations. He has been scheduled for an EGD in the [facility] with Dr. Havery [name] on Thursday, 10/13/21 at 11 am. Pt is aware that he will need to arrive on the 4th floor by 10 am with a care partner. Pt is aware that he will need to be NPO after midnight. Advised that I will send detailed instructions to him via my chart, he confirms that he has access. Pt has been advised that he can continue taking his Plavix. Pt verbalized understanding of all information and had no concerns at the end of the call.  Ambulatory referral to GI in epic.

## 2021-10-12 NOTE — Telephone Encounter (Signed)
Thanks Linna Hoff, Sorry to see this.  Brooklyn can you please schedule this patient for an EGD ASAP. I have an opening tomorrow AM if he can make that. I think okay to do a diagnostic EGD on Plavix, if he is free tomorrow. Otherwise if not can add on 730 case for me this Friday or next Tuesday. Can you let me know? Thanks

## 2021-10-12 NOTE — Telephone Encounter (Signed)
-----   Message -----  From: Milus Banister, MD  Sent: 10/12/2021   7:59 AM EST  To: Timothy Lasso, RN, [name], MD, *  Subject: RE: Patient needs EGD                           All, he is actually a patient of Steve's and I sent word to Richardson Landry already this morning about it.

## 2021-10-12 NOTE — Progress Notes (Signed)
The proposed treatment discussed in conference is for discussion purpose only and is not a binding recommendation.  The patients have not been physically examined, or presented with their treatment options.  Therefore, final treatment plans cannot be decided.  

## 2021-10-12 NOTE — Telephone Encounter (Signed)
Brooklyn thank you very much for turning this around so quickly and getting him scheduled, appreciate it.

## 2021-10-12 NOTE — Telephone Encounter (Signed)
-----   Message from Irving Copas., MD sent at 10/12/2021  7:48 AM EST ----- Regarding: Patient needs EGD Robert Warner, This patient was discussed in [facility] today. He needs an EGD to rule out an upper GI malignancy. Please schedule him after 5-day Plavix hold for EGD with myself/Jacobs/Gupta next available. Let us know when scheduled. Thanks. GM

## 2021-10-12 NOTE — Progress Notes (Signed)
Her2 and PDL 1 pathology testing added on for Accession number [id number]

## 2021-10-12 NOTE — Progress Notes (Signed)
   Covid-19 Vaccination Clinic  Name:  Robert Warner    MRN: [id number] DOB: August 19, 1941  10/12/2021  [name] was observed post Covid-19 immunization for 15 minutes without incident. He was provided with Vaccine Information Sheet and instruction to access the V-Safe system.   [name] was instructed to call 911 with any severe reactions post vaccine: Difficulty breathing  Swelling of face and throat  A fast heartbeat  A bad rash all over body  Dizziness and weakness   Immunizations Administered     Name Date Dose VIS Date Route   Pfizer Covid-19 Vaccine Bivalent Booster 10/12/2021 11:18 AM 0.3 mL 07/13/2021 Intramuscular   Manufacturer: [place]   Lot: [id number]   [place]: [phone]

## 2021-10-12 NOTE — Telephone Encounter (Signed)
Robert Warner, This is a patient of yours, discussed this AM at GI tumor board.  He has recently diagnosed adenocarcinoma of unclear etiology, multiple sites in his liver.  PAth suggests cholangio vs UGI primary and the team was hoping that he have an EGD.  Thanks  DJ

## 2021-10-13 ENCOUNTER — Encounter: Payer: Self-pay | Admitting: Gastroenterology

## 2021-10-13 ENCOUNTER — Other Ambulatory Visit (HOSPITAL_COMMUNITY): Payer: Self-pay | Admitting: Physician Assistant

## 2021-10-13 ENCOUNTER — Ambulatory Visit (AMBULATORY_SURGERY_CENTER): Payer: Medicare HMO | Admitting: Gastroenterology

## 2021-10-13 VITALS — BP 141/85 | HR 71 | Temp 96.9°F | Resp 17 | Ht 70.0 in | Wt 192.0 lb

## 2021-10-13 DIAGNOSIS — K2970 Gastritis, unspecified, without bleeding: Secondary | ICD-10-CM | POA: Diagnosis not present

## 2021-10-13 DIAGNOSIS — K449 Diaphragmatic hernia without obstruction or gangrene: Secondary | ICD-10-CM

## 2021-10-13 DIAGNOSIS — Z8719 Personal history of other diseases of the digestive system: Secondary | ICD-10-CM | POA: Diagnosis not present

## 2021-10-13 DIAGNOSIS — K222 Esophageal obstruction: Secondary | ICD-10-CM

## 2021-10-13 DIAGNOSIS — E119 Type 2 diabetes mellitus without complications: Secondary | ICD-10-CM | POA: Diagnosis not present

## 2021-10-13 DIAGNOSIS — C801 Malignant (primary) neoplasm, unspecified: Secondary | ICD-10-CM | POA: Diagnosis not present

## 2021-10-13 DIAGNOSIS — I10 Essential (primary) hypertension: Secondary | ICD-10-CM | POA: Diagnosis not present

## 2021-10-13 DIAGNOSIS — K2950 Unspecified chronic gastritis without bleeding: Secondary | ICD-10-CM | POA: Diagnosis not present

## 2021-10-13 DIAGNOSIS — B9681 Helicobacter pylori [H. pylori] as the cause of diseases classified elsewhere: Secondary | ICD-10-CM | POA: Diagnosis not present

## 2021-10-13 MED ORDER — SODIUM CHLORIDE 0.9 % IV SOLN: 500.0000 mL | Freq: Once | INTRAVENOUS | Status: DC

## 2021-10-13 NOTE — Patient Instructions (Signed)
YOU HAD AN ENDOSCOPIC PROCEDURE TODAY AT [facility]:   Refer to the procedure report that was given to you for any specific questions about what was found during the examination.  If the procedure report does not answer your questions, please call your gastroenterologist to clarify.  If you requested that your care partner not be given the details of your procedure findings, then the procedure report has been included in a sealed envelope for you to review at your convenience later.  YOU SHOULD EXPECT: Some feelings of bloating in the abdomen. Passage of more gas than usual.  Walking can help get rid of the air that was put into your GI tract during the procedure and reduce the bloating. If you had a lower endoscopy (such as a colonoscopy or flexible sigmoidoscopy) you may notice spotting of blood in your stool or on the toilet paper. If you underwent a bowel prep for your procedure, you may not have a normal bowel movement for a few days.  Please Note:  You might notice some irritation and congestion in your nose or some drainage.  This is from the oxygen used during your procedure.  There is no need for concern and it should clear up in a day or so.  SYMPTOMS TO REPORT IMMEDIATELY:    Following upper endoscopy (EGD)  Vomiting of blood or coffee ground material  New chest pain or pain under the shoulder blades  Painful or persistently difficult swallowing  New shortness of breath  Fever of 100F or higher  Black, tarry-looking stools  For urgent or emergent issues, a gastroenterologist can be reached at any hour by calling [phone]. Do not use MyChart messaging for urgent concerns.    DIET:  We do recommend a small meal at first, but then you may proceed to your regular diet.  Drink plenty of fluids but you should avoid alcoholic beverages for 24 hours.  ACTIVITY:  You should plan to take it easy for the rest of today and you should NOT DRIVE or use heavy machinery  until tomorrow (because of the sedation medicines used during the test).    FOLLOW UP: Our staff will call the number listed on your records 48-72 hours following your procedure to check on you and address any questions or concerns that you may have regarding the information given to you following your procedure. If we do not reach you, we will leave a message.  We will attempt to reach you two times.  During this call, we will ask if you have developed any symptoms of COVID 19. If you develop any symptoms (ie: fever, flu-like symptoms, shortness of breath, cough etc.) before then, please call [phone].  If you test positive for Covid 19 in the 2 weeks post procedure, please call and report this information to [place].    If any biopsies were taken you will be contacted by phone or by letter within the next 1-3 weeks.  Please call us at [phone] if you have not heard about the biopsies in 3 weeks.    SIGNATURES/CONFIDENTIALITY: You and/or your care partner have signed paperwork which will be entered into your electronic medical record.  These signatures attest to the fact that that the information above on your After Visit Summary has been reviewed and is understood.  Full responsibility of the confidentiality of this discharge information lies with you and/or your care-partner.    Continue present medications including Plavix.Information given on Gastritis.

## 2021-10-13 NOTE — Progress Notes (Signed)
Pt stable to RR 

## 2021-10-13 NOTE — Op Note (Signed)
[place] Patient Name: Robert Warner Procedure Date: 10/13/2021 11:07 AM MRN: [id number] Endoscopist: [name] , MD Age: 80 Referring MD:  Date of Birth: 06-19-1941 Gender: Male Account #: [id number] Procedure:                Upper GI endoscopy Indications:              Personal history of metastic malignant neoplasm of                            unknown primary - here for EGD to clear upper                            tract, patient endorses postprandial abdominal pain Medicines:                Monitored Anesthesia Care Procedure:                Pre-Anesthesia Assessment:                           - Prior to the procedure, a History and Physical                            was performed, and patient medications and                            allergies were reviewed. The patient's tolerance of                            previous anesthesia was also reviewed. The risks                            and benefits of the procedure and the sedation                            options and risks were discussed with the patient.                            All questions were answered, and informed consent                            was obtained. Prior Anticoagulants: The patient has                            taken Plavix (clopidogrel), last dose was day of                            procedure. ASA Grade Assessment: III - A patient                            with severe systemic disease. After reviewing the                            risks and benefits, the patient was deemed in  satisfactory condition to undergo the procedure.                           After obtaining informed consent, the endoscope was                            passed under direct vision. Throughout the                            procedure, the patient's blood pressure, pulse, and                            oxygen saturations were monitored continuously. The                             Endoscope was introduced through the mouth, and                            advanced to the second part of duodenum. The upper                            GI endoscopy was accomplished without difficulty.                            The patient tolerated the procedure well. Scope In: Scope Out: Findings:                 Esophagogastric landmarks were identified: the                            Z-line was found at 37 cm, the gastroesophageal                            junction was found at 37 cm and the upper extent of                            the gastric folds was found at 40 cm from the                            incisors.                           A 3 cm hiatal hernia was present.                           A widely patent Schatzki ring was found at the                            gastroesophageal junction.                           The exam of the esophagus was otherwise normal.                           Diffuse atrophic mucosa  was found in the entire                            examined stomach. Biopsies were taken with a cold                            forceps for Helicobacter pylori testing.                           The exam of the stomach was otherwise normal. No                            mass lesions.                           The duodenal bulb and second portion of the                            duodenum were normal. No mass lesions. Complications:            No immediate complications. Estimated blood loss:                            Minimal. Estimated Blood Loss:     Estimated blood loss was minimal. Impression:               - Esophagogastric landmarks identified.                           - 3 cm hiatal hernia.                           - Widely patent Schatzki ring.                           - Normal esophagus otherwise.                           - Gastric mucosal atrophy. Biopsied.                           - Normal duodenal bulb and second portion of the                             duodenum.                           No source for malignancy on EGD. Recommendation:           - Patient has a contact number available for                            emergencies. The signs and symptoms of potential                            delayed complications were discussed with the  patient. Return to normal activities tomorrow.                            Written discharge instructions were provided to the                            patient.                           - Resume previous diet.                           - Continue present medications including Plavix.                           - Await pathology results.                           - Will discuss results with your Oncologist and                            determine if any further testing is needed from our                            service to evaluate your bowel [name], MD 10/13/2021 11:58:45 AM This report has been signed electronically.

## 2021-10-13 NOTE — Progress Notes (Signed)
[facility] History and Physical   Primary Care Physician:  [name], MD   Reason for Procedure:   Malignancy of unknown primary - possibly upper GI tract primary, oncology is requesting EGD  Plan:    EGD     HPI: Robert Warner is a 80 y.o. male  here for an EGD to evaluate his upper GI tract. Unfortunately has been diagnosed with metastatic adenocarcinoma of unknown primary, oncology is suspicious for primary upper tract GI origin. They have requested an EGD to further evaluate. Patient is on plavix for history of CVA and is continuing this for this diagnostic exam.  Have discussed risks / benefits of the exam and anesthesia with the patient who agrees to proceed He denies any cardiopulmonary complaints. HE does have abdominal pain in his upper abdomen with eating.  Past Medical History:  Diagnosis Date   Arthritis    Diabetes mellitus    Hyperlipidemia    Hypertension    Stroke [facility])     Past Surgical History:  Procedure Laterality Date   APPENDECTOMY  1968   CATARACT EXTRACTION Bilateral 07/2000   states 2 weeks ago (first of sept) OD due in Dec    COLONOSCOPY  2004,2009   Negative, Dr. Sharlett Iles    KIDNEY STONE SURGERY  10/2018   PROSTATE BIOPSY  2006   Dr.Sigmund Tannebaum    Prior to Admission medications   Medication Sig Start Date End Date Taking? Authorizing Provider  acetaminophen (TYLENOL) 650 MG CR tablet Take 650 mg by mouth every 8 (eight) hours as needed for pain.    [provider]  aspirin EC 81 MG tablet Take 1 tablet (81 mg total) by mouth daily. Swallow whole. 09/25/21 10/25/21  Antonieta Pert, MD  atorvastatin (LIPITOR) 40 MG tablet Take 1 tablet (40 mg total) by mouth daily. 09/23/21 10/23/21  Antonieta Pert, MD  Cholecalciferol (VITAMIN D3) 2000 UNITS TABS Take 2,000 Units by mouth daily.    [provider]  clopidogrel (PLAVIX) 75 MG tablet Take 1 tablet (75 mg total) by mouth daily. Start after conforming with IR  Doctor after biopsy 09/25/21 10/25/21  Antonieta Pert, MD  COVID-19 mRNA bivalent vaccine, Pfizer, (PFIZER COVID-19 VAC BIVALENT) injection Inject into the muscle. 10/12/21   Carlyle Basques, MD  glucose blood (FREESTYLE LITE) test strip CHECK BLOOD SUGAR ONCE DAILY.  Dx:E11.9 08/04/20   Burchette, Alinda Sierras, MD  Lancets (FREESTYLE) lancets Check blood sugar once daily 03/15/12   Hendricks Limes, MD  metFORMIN (GLUCOPHAGE-XR) 750 MG 24 hr tablet TAKE 1 TABLET(750 MG) BY MOUTH DAILY WITH BREAKFAST Patient taking differently: Take 750 mg by mouth daily with breakfast. 08/22/21   Burchette, Alinda Sierras, MD  pantoprazole (PROTONIX) 20 MG tablet TAKE 1 TABLET(20 MG) BY MOUTH DAILY Patient not taking: Reported on 10/11/2021 09/30/21   [name], NP  silodosin (RAPAFLO) 8 MG CAPS capsule Take 8 mg by mouth at bedtime.    [provider]  traMADol (ULTRAM) 50 MG tablet Take 1 tablet (50 mg total) by mouth every 8 (eight) hours as needed for moderate pain. 10/11/21   [name], NP  TRAVATAN Z 0.004 % SOLN ophthalmic solution Place 1 drop into both eyes at bedtime. 11/12/16   [provider]    Current Outpatient Medications  Medication Sig Dispense Refill   acetaminophen (TYLENOL) 650 MG CR tablet Take 650 mg by mouth every 8 (eight) hours as needed for pain.     aspirin  EC 81 MG tablet Take 1 tablet (81 mg total) by mouth daily. Swallow whole. 30 tablet 0   atorvastatin (LIPITOR) 40 MG tablet Take 1 tablet (40 mg total) by mouth daily. 30 tablet 0   Cholecalciferol (VITAMIN D3) 2000 UNITS TABS Take 2,000 Units by mouth daily.     clopidogrel (PLAVIX) 75 MG tablet Take 1 tablet (75 mg total) by mouth daily. Start after conforming with IR Doctor after biopsy 30 tablet 0   COVID-19 mRNA bivalent vaccine, Pfizer, (PFIZER COVID-19 VAC BIVALENT) injection Inject into the muscle. 0.3 mL 0   glucose blood (FREESTYLE LITE) test strip CHECK BLOOD SUGAR ONCE DAILY.  Dx:E11.9 100 each 3   Lancets  (FREESTYLE) lancets Check blood sugar once daily 100 each 3   metFORMIN (GLUCOPHAGE-XR) 750 MG 24 hr tablet TAKE 1 TABLET(750 MG) BY MOUTH DAILY WITH BREAKFAST (Patient taking differently: Take 750 mg by mouth daily with breakfast.) 90 tablet 0   pantoprazole (PROTONIX) 20 MG tablet TAKE 1 TABLET(20 MG) BY MOUTH DAILY (Patient not taking: Reported on 10/11/2021) 90 tablet 0   silodosin (RAPAFLO) 8 MG CAPS capsule Take 8 mg by mouth at bedtime.     traMADol (ULTRAM) 50 MG tablet Take 1 tablet (50 mg total) by mouth every 8 (eight) hours as needed for moderate pain. 30 tablet 0   TRAVATAN Z 0.004 % SOLN ophthalmic solution Place 1 drop into both eyes at bedtime.     Current Facility-Administered Medications  Medication Dose Route Frequency Provider Last Rate Last Admin   0.9 %  sodium chloride infusion  500 mL Intravenous Once Cystal Shannahan, Carlota Raspberry, MD        Allergies as of 10/13/2021   (No Known Allergies)    Family History  Problem Relation Age of Onset   Stroke Father 62   Hypertension Mother    Coronary artery disease Mother    Diabetes Maternal Aunt    Pancreatic cancer Brother    Stroke Paternal Grandmother    Breast cancer Paternal Aunt    Coronary artery disease Paternal Aunt    Coronary artery disease Maternal Grandmother    Coronary artery disease Maternal Uncle        2 Maternal Uncles    Diabetes Maternal Uncle    Stroke Sister    Heart disease Neg Hx     Social History   Socioeconomic History   Marital status: Married    Spouse name: Not on file   Number of children: 2   Years of education: Not on file   Highest education level: Not on file  Occupational History   Occupation: retired Airline pilot  Tobacco Use   Smoking status: Not on file   Smokeless tobacco: Never  Vaping Use   Vaping Use: Never used  Substance and Sexual Activity   Alcohol use: Yes    Alcohol/week: 2.0 standard drinks    Types: 2 Glasses of wine per week    Comment: Occasional wine or  beer   Drug use: No   Sexual activity: Not on file  Other Topics Concern   Not on file  Social History Narrative   Not on file   Social Determinants of Health   Financial Resource Strain: Low Risk    Difficulty of Paying Living Expenses: Not hard at all  Food Insecurity: No Food Insecurity   Worried About Charity fundraiser in the Last Year: Never true   [place] in the Last Year: Never  true  Transportation Needs: No Transportation Needs   Lack of Transportation (Medical): No   Lack of Transportation (Non-Medical): No  Physical Activity: Sufficiently Active   Days of Exercise per Week: 3 days   Minutes of Exercise per Session: 60 min  Stress: No Stress Concern Present   Feeling of Stress : Not at all  Social Connections: Socially Integrated   Frequency of Communication with Friends and Family: Three times a week   Frequency of Social Gatherings with Friends and Family: Three times a week   Attends Religious Services: More than 4 times per year   Active Member of Clubs or Organizations: Yes   Attends Music therapist: More than 4 times per year   Marital Status: Married  Human resources officer Violence: Not At Risk   Fear of Current or Ex-Partner: No   Emotionally Abused: No   Physically Abused: No   Sexually Abused: No    Review of Systems: All other review of systems negative except as mentioned in the HPI.  Physical Exam: Vital signs BP 122/87   Pulse 86   Temp (!) 96.9 F (36.1 C) (Temporal)   Ht 5\' 10"  (1.778 m)   Wt 192 lb (87.1 kg)   SpO2 97%   BMI 27.55 kg/m   General:   Alert,  Well-developed, pleasant and cooperative in NAD Lungs:  Clear throughout to auscultation.   Heart:  Regular rate and rhythm Abdomen:  Soft, nontender and nondistended.   Neuro/Psych:  Alert and cooperative. Normal mood and affect. A and O x 3  Jolly Mango, MD [facility]

## 2021-10-14 ENCOUNTER — Ambulatory Visit: Payer: Medicare HMO | Admitting: Physical Therapy

## 2021-10-14 ENCOUNTER — Ambulatory Visit (HOSPITAL_COMMUNITY)
Admission: RE | Admit: 2021-10-14 | Discharge: 2021-10-14 | Disposition: A | Discharge status: Home / Self Care | Payer: Medicare HMO | Source: Ambulatory Visit | Attending: Nurse Practitioner | Admitting: Nurse Practitioner

## 2021-10-14 ENCOUNTER — Encounter: Payer: Self-pay | Admitting: Physical Therapy

## 2021-10-14 ENCOUNTER — Encounter (HOSPITAL_COMMUNITY): Payer: Self-pay

## 2021-10-14 ENCOUNTER — Other Ambulatory Visit: Payer: Self-pay

## 2021-10-14 DIAGNOSIS — R2681 Unsteadiness on feet: Secondary | ICD-10-CM | POA: Insufficient documentation

## 2021-10-14 DIAGNOSIS — E119 Type 2 diabetes mellitus without complications: Secondary | ICD-10-CM | POA: Insufficient documentation

## 2021-10-14 DIAGNOSIS — R293 Abnormal posture: Secondary | ICD-10-CM | POA: Insufficient documentation

## 2021-10-14 DIAGNOSIS — R2689 Other abnormalities of gait and mobility: Secondary | ICD-10-CM | POA: Insufficient documentation

## 2021-10-14 DIAGNOSIS — M6281 Muscle weakness (generalized): Secondary | ICD-10-CM

## 2021-10-14 DIAGNOSIS — I10 Essential (primary) hypertension: Secondary | ICD-10-CM | POA: Diagnosis not present

## 2021-10-14 DIAGNOSIS — C229 Malignant neoplasm of liver, not specified as primary or secondary: Secondary | ICD-10-CM | POA: Insufficient documentation

## 2021-10-14 DIAGNOSIS — Z452 Encounter for adjustment and management of vascular access device: Secondary | ICD-10-CM | POA: Diagnosis not present

## 2021-10-14 DIAGNOSIS — E785 Hyperlipidemia, unspecified: Secondary | ICD-10-CM | POA: Diagnosis not present

## 2021-10-14 DIAGNOSIS — C220 Liver cell carcinoma: Secondary | ICD-10-CM | POA: Diagnosis not present

## 2021-10-14 HISTORY — PX: IR IMAGING GUIDED PORT INSERTION: IMG5740

## 2021-10-14 LAB — GLUCOSE, CAPILLARY: Glucose-Capillary: 113 mg/dL — ABNORMAL HIGH (ref 70–99)

## 2021-10-14 IMAGING — US IR IMAGING GUIDED PORT INSERTION
1 series · 1 of 1 positions shown · non-contrast
Comparison: none

INDICATION: Adenocarcinoma of the liver

[Series 1: ir fluoro/shunt/fist · 1 of 1 slices shown]
[im 1/1]
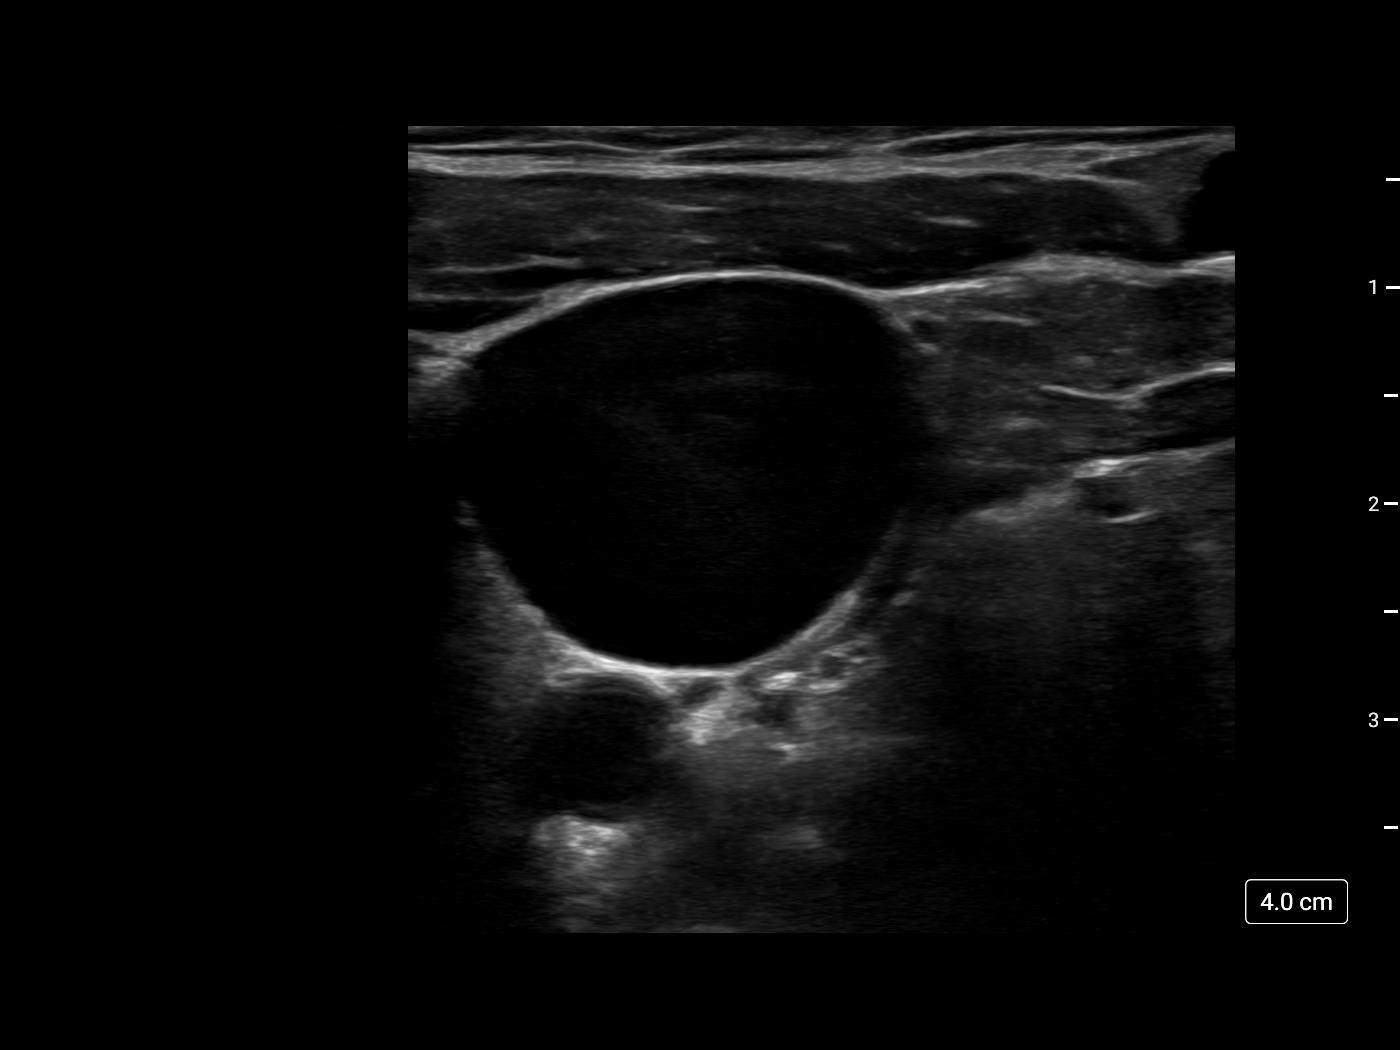

[1 of 1 positions shown; findings below may reference images not displayed]

EXAM:
IMPLANTED PORT A CATH PLACEMENT WITH ULTRASOUND AND FLUOROSCOPIC
GUIDANCE

MEDICATIONS:
None

ANESTHESIA/SEDATION:
Moderate (conscious) sedation was employed during this procedure. A
total of Versed 1 mg and Fentanyl 50 mcg was administered
intravenously.

Moderate Sedation Time: 14 minutes. The patient's level of
consciousness and vital signs were monitored continuously by
radiology nursing throughout the procedure under my direct
supervision.

FLUOROSCOPY TIME:  0 minutes, 30 seconds (2 mGy)

COMPLICATIONS:
None immediate.

PROCEDURE:
The procedure, risks, benefits, and alternatives were explained to
the patient. Questions regarding the procedure were encouraged and
answered. The patient understands and consents to the procedure.

A timeout was performed prior to the initiation of the procedure.

Patient positioned supine on the angiography table.

Right neck and anterior upper chest prepped and draped in the usual
sterile fashion. All elements of maximal sterile barrier were
utilized including, cap, mask, sterile gown, sterile gloves, large
sterile drape, hand scrubbing and 2% Chlorhexidine for skin
cleaning.

The right internal jugular vein was evaluated with ultrasound and
shown to be patent. A permanent ultrasound image was obtained and
placed in the patient's medical record. Local anesthesia was
provided with 1% lidocaine with epinephrine.

Using sterile gel and a sterile probe cover, the right internal
jugular vein was entered with a 21 ga needle during real time
ultrasound guidance.

0.018 inch guidewire placed and 21 ga needle exchanged for
transitional dilator set. Utilizing fluoroscopy, 0.035 inch
guidewire advanced through the needle without difficulty.

Attention then turned to the right anterior upper chest. Following
local lidocaine administration, a port pocket was created. The
catheter was connected to the port and brought from the pocket to
the venotomy site through a subcutaneous tunnel.

The catheter was cut to size and inserted through the peel-away
sheath. The catheter tip was positioned at the cavoatrial junction
using fluoroscopic guidance.

The port aspirated and flushed well. The port pocket was closed with
deep and superficial absorbable suture. The port pocket incision and
venotomy sites were also sealed with Dermabond.
IMPRESSION: Successful placement of a right internal jugular approach power
injectable Port-A-Cath. The catheter is ready for immediate use.

## 2021-10-14 MED ORDER — LIDOCAINE-EPINEPHRINE (PF) 2 %-1:200000 IJ SOLN
INTRAMUSCULAR | Status: AC
  Filled 2021-10-14: qty 20

## 2021-10-14 MED ORDER — MIDAZOLAM HCL 2 MG/2ML IJ SOLN
INTRAMUSCULAR | Status: AC | PRN
  Administered 2021-10-14: 1 mg via INTRAVENOUS

## 2021-10-14 MED ORDER — LIDOCAINE-EPINEPHRINE (PF) 2 %-1:200000 IJ SOLN
INTRAMUSCULAR | Status: AC | PRN
  Administered 2021-10-14: 10 mL via INTRADERMAL

## 2021-10-14 MED ORDER — SODIUM CHLORIDE 0.9 % IV SOLN: INTRAVENOUS | Status: DC

## 2021-10-14 MED ORDER — FENTANYL CITRATE (PF) 100 MCG/2ML IJ SOLN
INTRAMUSCULAR | Status: AC | PRN
  Administered 2021-10-14: 50 ug via INTRAVENOUS

## 2021-10-14 MED ORDER — HEPARIN SOD (PORK) LOCK FLUSH 100 UNIT/ML IV SOLN
INTRAVENOUS | Status: AC | PRN
  Administered 2021-10-14: 500 [IU] via INTRAVENOUS

## 2021-10-14 MED ORDER — FENTANYL CITRATE (PF) 100 MCG/2ML IJ SOLN
INTRAMUSCULAR | Status: AC
  Filled 2021-10-14: qty 2

## 2021-10-14 MED ORDER — HEPARIN SOD (PORK) LOCK FLUSH 100 UNIT/ML IV SOLN
INTRAVENOUS | Status: AC
  Filled 2021-10-14: qty 5

## 2021-10-14 MED ORDER — MIDAZOLAM HCL 2 MG/2ML IJ SOLN
INTRAMUSCULAR | Status: AC
  Filled 2021-10-14: qty 2

## 2021-10-14 NOTE — H&P (Signed)
Chief Complaint: Patient was seen in consultation today for Port-A-Cath placement  at the request of [name]  Referring Physician(s): [name]  Supervising Physician: Mir, Sharen Heck  Patient Status: [facility] - Out-pt  History of Present Illness: Robert Warner is a 80 y.o. male with PMH of HTN, HLD, DM and adenocarcinoma of the liver with unknown primary.  Patient has been referred by Ned Card, NP for Port-A-Cath placement to begin chemotherapy.  Past Medical History:  Diagnosis Date   Arthritis    Cancer ([place])    Cataract    Diabetes mellitus    Hyperlipidemia    Hypertension    Stroke [facility])     Past Surgical History:  Procedure Laterality Date   APPENDECTOMY  1968   CATARACT EXTRACTION Bilateral 07/2000   states 2 weeks ago (first of sept) OD due in Dec    COLONOSCOPY  2004,2009   Negative, Dr. Sharlett Iles    KIDNEY STONE SURGERY  10/2018   PROSTATE BIOPSY  2006   Dr.Sigmund Tannebaum    Allergies: Patient has no known allergies.  Medications: Prior to Admission medications   Medication Sig Start Date End Date Taking? Authorizing Provider  acetaminophen (TYLENOL) 650 MG CR tablet Take 650 mg by mouth every 8 (eight) hours as needed for pain.    [provider]  aspirin EC 81 MG tablet Take 1 tablet (81 mg total) by mouth daily. Swallow whole. 09/25/21 10/25/21  Antonieta Pert, MD  atorvastatin (LIPITOR) 40 MG tablet Take 1 tablet (40 mg total) by mouth daily. 09/23/21 10/23/21  Antonieta Pert, MD  Cholecalciferol (VITAMIN D3) 2000 UNITS TABS Take 2,000 Units by mouth daily.    [provider]  clopidogrel (PLAVIX) 75 MG tablet Take 1 tablet (75 mg total) by mouth daily. Start after conforming with IR Doctor after biopsy 09/25/21 10/25/21  Antonieta Pert, MD  COVID-19 mRNA bivalent vaccine, Pfizer, (PFIZER COVID-19 VAC BIVALENT) injection Inject into the muscle. 10/12/21   Carlyle Basques, MD  glucose blood (FREESTYLE LITE) test strip CHECK BLOOD  SUGAR ONCE DAILY.  Dx:E11.9 08/04/20   Burchette, Alinda Sierras, MD  Lancets (FREESTYLE) lancets Check blood sugar once daily 03/15/12   Hendricks Limes, MD  metFORMIN (GLUCOPHAGE-XR) 750 MG 24 hr tablet TAKE 1 TABLET(750 MG) BY MOUTH DAILY WITH BREAKFAST Patient taking differently: Take 750 mg by mouth daily with breakfast. 08/22/21   Burchette, Alinda Sierras, MD  pantoprazole (PROTONIX) 20 MG tablet TAKE 1 TABLET(20 MG) BY MOUTH DAILY Patient not taking: Reported on 10/13/2021 09/30/21   [name], NP  silodosin (RAPAFLO) 8 MG CAPS capsule Take 8 mg by mouth at bedtime.    [provider]  traMADol (ULTRAM) 50 MG tablet Take 1 tablet (50 mg total) by mouth every 8 (eight) hours as needed for moderate pain. Patient not taking: Reported on 10/13/2021 10/11/21   [name], NP  TRAVATAN Z 0.004 % SOLN ophthalmic solution Place 1 drop into both eyes at bedtime. 11/12/16   [provider]     Family History  Problem Relation Age of Onset   Hypertension Mother    Coronary artery disease Mother    Stroke Father 47   Stroke Sister    Pancreatic cancer Brother    Diabetes Maternal Aunt    Coronary artery disease Maternal Uncle        2 Maternal Uncles    Diabetes Maternal Uncle    Breast cancer Paternal Aunt    Coronary  artery disease Paternal Aunt    Coronary artery disease Maternal Grandmother    Stroke Paternal Grandmother    Heart disease Neg Hx    Colon cancer Neg Hx    Esophageal cancer Neg Hx    Rectal cancer Neg Hx    Stomach cancer Neg Hx     Social History   Socioeconomic History   Marital status: Married    Spouse name: Not on file   Number of children: 2   Years of education: Not on file   Highest education level: Not on file  Occupational History   Occupation: retired Airline pilot  Tobacco Use   Smoking status: Former    Packs/day: 0.50    Years: 10.00    Pack years: 5.00    Types: Cigarettes    Quit date: 11/13/1966    Years since quitting: 54.9    Smokeless tobacco: Never  Vaping Use   Vaping Use: Never used  Substance and Sexual Activity   Alcohol use: Not Currently    Alcohol/week: 2.0 standard drinks    Types: 2 Glasses of wine per week    Comment: Occasional wine or beer   Drug use: No   Sexual activity: Not on file  Other Topics Concern   Not on file  Social History Narrative   Not on file   Social Determinants of Health   Financial Resource Strain: Low Risk    Difficulty of Paying Living Expenses: Not hard at all  Food Insecurity: No Food Insecurity   Worried About Charity fundraiser in the Last Year: Never true   [place] in the Last Year: Never true  Transportation Needs: No Transportation Needs   Lack of Transportation (Medical): No   Lack of Transportation (Non-Medical): No  Physical Activity: Sufficiently Active   Days of Exercise per Week: 3 days   Minutes of Exercise per Session: 60 min  Stress: No Stress Concern Present   Feeling of Stress : Not at all  Social Connections: Socially Integrated   Frequency of Communication with Friends and Family: Three times a week   Frequency of Social Gatherings with Friends and Family: Three times a week   Attends Religious Services: More than 4 times per year   Active Member of Clubs or Organizations: Yes   Attends Archivist Meetings: More than 4 times per year   Marital Status: Married    Review of Systems: A 12 point ROS discussed and pertinent positives are indicated in the HPI above.  All other systems are negative.  Review of Systems  Constitutional:  Negative for chills and fever.  HENT:  Negative for nosebleeds.   Eyes:  Negative for visual disturbance.  Respiratory:  Negative for cough and shortness of breath.   Cardiovascular:  Negative for chest pain and leg swelling.  Gastrointestinal:  Negative for abdominal pain, blood in stool, nausea and vomiting.  Genitourinary:  Negative for hematuria.  Neurological:  Negative for dizziness,  light-headedness and headaches.   Vital Signs: BP (!) 150/99   Pulse 80   Temp 98.1 F (36.7 C) (Oral)   Resp 18   Ht 5\' 10"  (1.778 m)   Wt 192 lb (87.1 kg)   SpO2 98%   BMI 27.55 kg/m   Physical Exam Constitutional:      Appearance: Normal appearance.  HENT:     Head: Normocephalic and atraumatic.     Mouth/Throat:     Mouth: Mucous membranes are dry.  Pharynx: Oropharynx is clear.  Cardiovascular:     Rate and Rhythm: Normal rate and regular rhythm.     Pulses: Normal pulses.     Heart sounds: Normal heart sounds. No murmur heard.   No friction rub. No gallop.  Pulmonary:     Effort: Pulmonary effort is normal. No respiratory distress.     Breath sounds: Normal breath sounds. No stridor. No wheezing, rhonchi or rales.  Abdominal:     General: Bowel sounds are normal. There is no distension.     Palpations: Abdomen is soft.     Tenderness: There is no abdominal tenderness. There is no guarding.  Musculoskeletal:     Right lower leg: No edema.     Left lower leg: No edema.  Skin:    General: Skin is warm and dry.  Neurological:     Mental Status: He is alert and oriented to person, place, and time.  Psychiatric:        Mood and Affect: Mood normal.        Behavior: Behavior normal.        Thought Content: Thought content normal.        Judgment: Judgment normal.    Imaging: CT ANGIO HEAD W OR WO CONTRAST  Result Date: 09/16/2021 CLINICAL DATA:  Dizziness EXAM: CT ANGIOGRAPHY HEAD AND NECK TECHNIQUE: Multidetector CT imaging of the head and neck was performed using the standard protocol during bolus administration of intravenous contrast. Multiplanar CT image reconstructions and MIPs were obtained to evaluate the vascular anatomy. Carotid stenosis measurements (when applicable) are obtained utilizing NASCET criteria, using the distal internal carotid diameter as the denominator. CONTRAST:  81mL OMNIPAQUE IOHEXOL 350 MG/ML SOLN COMPARISON:  None. FINDINGS: CTA NECK  FINDINGS SKELETON: There is no bony spinal canal stenosis. No lytic or blastic lesion. OTHER NECK: Normal pharynx, larynx and major salivary glands. No cervical lymphadenopathy. Unremarkable thyroid gland. UPPER CHEST: Right upper lobe nodule measures 9 mm (series 5, image 8). AORTIC ARCH: There is no calcific atherosclerosis of the aortic arch. There is no aneurysm, dissection or hemodynamically significant stenosis of the visualized portion of the aorta. Conventional 3 vessel aortic branching pattern. The visualized proximal subclavian arteries are widely patent. RIGHT CAROTID SYSTEM: Normal without aneurysm, dissection or stenosis. LEFT CAROTID SYSTEM: Normal without aneurysm, dissection or stenosis. VERTEBRAL ARTERIES: Left dominant configuration. Both origins are clearly patent. There is no dissection, occlusion or flow-limiting stenosis to the skull base (V1-V3 segments). CTA HEAD FINDINGS POSTERIOR CIRCULATION: --Vertebral arteries: Normal V4 segments. --Inferior cerebellar arteries: Normal. --Basilar artery: Normal. --Superior cerebellar arteries: Normal. --Posterior cerebral arteries (PCA): Normal. ANTERIOR CIRCULATION: --Intracranial internal carotid arteries: Normal. --Anterior cerebral arteries (ACA): Normal. Both A1 segments are present. Patent anterior communicating artery (a-comm). --Middle cerebral arteries (MCA): Normal. VENOUS SINUSES: As permitted by contrast timing, patent. ANATOMIC VARIANTS: None Review of the MIP images confirms the above findings. IMPRESSION: 1. No emergent large vessel occlusion or high-grade stenosis of the intracranial or cervical arteries. 2. 9 mm right upper lobe pulmonary nodule. Consider one of the following in 3 months for both low-risk and high-risk individuals: (a) repeat chest CT, (b) follow-up PET-CT, or (c) tissue sampling. This recommendation follows the consensus statement: Guidelines for Management of Incidental Pulmonary Nodules Detected on CT Images: From the  Fleischner Society 2017; Radiology 2017; 284:228-243. Electronically Signed   By: Ulyses Jarred M.D.   On: 09/16/2021 22:52   DG Chest 2 View  Result Date: 09/20/2021 CLINICAL DATA:  Chest  pain EXAM: CHEST - 2 VIEW COMPARISON:  10/27/2008 FINDINGS: The heart size and mediastinal contours are within normal limits. Both lungs are clear. The visualized skeletal structures are unremarkable. IMPRESSION: No active cardiopulmonary disease. Electronically Signed   By: Donavan Foil M.D.   On: 09/20/2021 21:19   CT HEAD WO CONTRAST  Result Date: 09/16/2021 CLINICAL DATA:  Altered mental status. EXAM: CT HEAD WITHOUT CONTRAST TECHNIQUE: Contiguous axial images were obtained from the base of the skull through the vertex without intravenous contrast. COMPARISON:  None. FINDINGS: Brain: Mild chronic ischemic white matter disease is noted. No mass effect or midline shift is noted. Ventricular size is within normal limits. There is no evidence of mass lesion, hemorrhage or acute infarction. Vascular: No hyperdense vessel or unexpected calcification. Skull: Normal. Negative for fracture or focal lesion. Sinuses/Orbits: No acute finding. Other: None. IMPRESSION: No acute intracranial abnormality seen. Electronically Signed   By: [name] M.D.   On: 09/16/2021 16:18   CT ANGIO NECK W OR WO CONTRAST  Result Date: 09/16/2021 CLINICAL DATA:  Dizziness EXAM: CT ANGIOGRAPHY HEAD AND NECK TECHNIQUE: Multidetector CT imaging of the head and neck was performed using the standard protocol during bolus administration of intravenous contrast. Multiplanar CT image reconstructions and MIPs were obtained to evaluate the vascular anatomy. Carotid stenosis measurements (when applicable) are obtained utilizing NASCET criteria, using the distal internal carotid diameter as the denominator. CONTRAST:  17mL OMNIPAQUE IOHEXOL 350 MG/ML SOLN COMPARISON:  None. FINDINGS: CTA NECK FINDINGS SKELETON: There is no bony spinal canal stenosis.  No lytic or blastic lesion. OTHER NECK: Normal pharynx, larynx and major salivary glands. No cervical lymphadenopathy. Unremarkable thyroid gland. UPPER CHEST: Right upper lobe nodule measures 9 mm (series 5, image 8). AORTIC ARCH: There is no calcific atherosclerosis of the aortic arch. There is no aneurysm, dissection or hemodynamically significant stenosis of the visualized portion of the aorta. Conventional 3 vessel aortic branching pattern. The visualized proximal subclavian arteries are widely patent. RIGHT CAROTID SYSTEM: Normal without aneurysm, dissection or stenosis. LEFT CAROTID SYSTEM: Normal without aneurysm, dissection or stenosis. VERTEBRAL ARTERIES: Left dominant configuration. Both origins are clearly patent. There is no dissection, occlusion or flow-limiting stenosis to the skull base (V1-V3 segments). CTA HEAD FINDINGS POSTERIOR CIRCULATION: --Vertebral arteries: Normal V4 segments. --Inferior cerebellar arteries: Normal. --Basilar artery: Normal. --Superior cerebellar arteries: Normal. --Posterior cerebral arteries (PCA): Normal. ANTERIOR CIRCULATION: --Intracranial internal carotid arteries: Normal. --Anterior cerebral arteries (ACA): Normal. Both A1 segments are present. Patent anterior communicating artery (a-comm). --Middle cerebral arteries (MCA): Normal. VENOUS SINUSES: As permitted by contrast timing, patent. ANATOMIC VARIANTS: None Review of the MIP images confirms the above findings. IMPRESSION: 1. No emergent large vessel occlusion or high-grade stenosis of the intracranial or cervical arteries. 2. 9 mm right upper lobe pulmonary nodule. Consider one of the following in 3 months for both low-risk and high-risk individuals: (a) repeat chest CT, (b) follow-up PET-CT, or (c) tissue sampling. This recommendation follows the consensus statement: Guidelines for Management of Incidental Pulmonary Nodules Detected on CT Images: From the Fleischner Society 2017; Radiology 2017; 284:228-243.  Electronically Signed   By: Ulyses Jarred M.D.   On: 09/16/2021 22:52   MR Brain Wo Contrast (neuro protocol)  Result Date: 09/16/2021 CLINICAL DATA:  Dizziness EXAM: MRI HEAD WITHOUT CONTRAST TECHNIQUE: Multiplanar, multiecho pulse sequences of the brain and surrounding structures were obtained without intravenous contrast. COMPARISON:  No prior MRI, correlation is made with 09/16/2021 CT head FINDINGS: Brain: Multiple small foci  of restricted diffusion with ADC correlates in the cerebral cortex bilaterally, as well as cerebral white matter, right thalamus, and bilateral cerebellar hemispheres, likely acute infarcts, with additional increased signal on diffusion-weighted imaging without ADC correlate in the vermis and left aspect of the splenium of the corpus callosum, likely subacute infarcts. No evidence of hemorrhage, mass, mass effect, or midline shift. Confluent T2 hyperintense signal in the periventricular white matter and pons, likely the sequela of severe chronic small vessel ischemic disease. Vascular: Normal flow voids. Skull and upper cervical spine: Normal marrow signal. Sinuses/Orbits: No acute finding. Status post bilateral lens replacements. Other: The mastoids are well aerated. IMPRESSION: Multiple small foci of restricted diffusion throughout the bilateral cerebral and cerebellar hemispheres, as well as the right thalamus, consistent with acute infarcts. Additional subacute infarcts in the vermis and left aspect of the splenium of the corpus callosum. Given different vascular territories, an embolic etiology is favored. These results were called by telephone at the time of interpretation on 09/16/2021 at 7:47 pm to provider HALEY SAGE , who verbally acknowledged these results. Electronically Signed   By: Merilyn Baba M.D.   On: 09/16/2021 19:48   NM PET Image Initial (PI) Skull Base To Thigh  Result Date: 10/11/2021 CLINICAL DATA:  Initial treatment strategy for metastatic adenocarcinoma  of the liver. Unknown primary. EXAM: NUCLEAR MEDICINE PET SKULL BASE TO THIGH TECHNIQUE: 9.49 mCi F-18 FDG was injected intravenously. Full-ring PET imaging was performed from the skull base to thigh after the radiotracer. CT data was obtained and used for attenuation correction and anatomic localization. Fasting blood glucose: 133 mg/dl COMPARISON:  MRI abdomen 09/03/2021 FINDINGS: Mediastinal blood pool activity: SUV max 2.41 Liver activity: SUV max NA NECK: No hypermetabolic lymph nodes in the neck. Incidental CT findings: none CHEST: Solitary 10 mm right upper lobe pulmonary nodule demonstrates minimal FDG uptake with SUV max of 1.63. This is an indeterminate finding but unlikely a primary lung neoplasm. Possible metastatic focus. 7.5 mm left internal lymph node on image number 85/6 is hypermetabolic with SUV max of 3.14. No enlarged or hypermetabolic mediastinal or hilar lymph nodes. No chest wall mass, supraclavicular or axillary adenopathy. Incidental CT findings: Mild tortuosity and fusiform aneurysmal dilatation of the descending thoracic aorta with maximum diameter 3.6 cm no atherosclerotic calcifications. No obvious coronary artery calcifications. ABDOMEN/PELVIS: Numerous hepatic lesions are identified as seen on the prior MR examination. Larger coalescent lesion or possible primary lesion in the segment 5 area with SUV max of 10.33. Multiple other lesions are identified with SUV max ranging from 8.0 to 9.1. Periportal lymphadenopathy. CS axis node measures 17 mm and has an SUV max of 8.18. Two adjacent lesions are noted in the mesentery in the right mid abdomen with somewhat irregular margins. Maximum measurement of the 2 lesions is 3 cm on image 118/4. SUV max is 8.09. I do not see any calcifications and carcinoid tumor is unlikely given the pathology of liver lesions. No hypermetabolic colonic lesion is identified to suggest a primary colon cancer. No pancreatic lesion. No gastric lesion. No  retroperitoneal lymphadenopathy. Adrenal glands are unremarkable. Incidental CT findings: Stable large right renal cyst. Incidental Urolift device noted in the prostate gland. No hypermetabolic prostate lesion. SKELETON: No findings suspicious for osseous metastatic disease. Incidental CT findings: none IMPRESSION: 1. Numerous hepatic lesions are hypermetabolic as detailed above. There is also hypermetabolic periportal lymphadenopathy. No obvious primary lesion identified in the colon, stomach or bowel. 2. 3 cm mesenteric "mass" in the right  mid abdomen most likely metastatic adenopathy. 3. Solitary right upper lobe pulmonary nodule is weakly hypermetabolic and could represent a metastatic focus. There is also a hypermetabolic left internal mammary lymph node. 4. Fusiform aneurysmal dilatation of the descending thoracic aorta with maximum measurement of 3.5 cm. Electronically Signed   By: Marijo Sanes M.D.   On: 10/11/2021 08:56   CT CORONARY MORPH W/CTA COR W/SCORE W/CA W/CM &/OR WO/CM  Addendum Date: 09/22/2021   ADDENDUM REPORT: 09/22/2021 15:35 EXAM: OVER-READ INTERPRETATION  CT CHEST The following report is an over-read performed by radiologist Dr. Rebekah Chesterfield [facility] on 09/22/2021. This over-read does not include interpretation of cardiac or coronary anatomy or pathology. The coronary calcium score and cardiac CTA interpretation by the cardiologist is attached. COMPARISON:  None. FINDINGS: Within the visualized portions of the thorax there are no suspicious appearing pulmonary nodules or masses, there is no acute consolidative airspace disease, no pleural effusions, no pneumothorax and no lymphadenopathy. Visualized portions of the upper abdomen are unremarkable. There are no aggressive appearing lytic or blastic lesions noted in the visualized portions of the skeleton. IMPRESSION: 1. No significant incidental noncardiac findings are noted. Electronically Signed   By: Vinnie Langton M.D.   On: 09/22/2021 15:35   Result Date: 09/22/2021 CLINICAL DATA:  30M with chest pain EXAM: Cardiac/Coronary CTA TECHNIQUE: The patient was scanned on a [employer]. FINDINGS: A 100 kV prospective scan was triggered in the descending thoracic aorta at 111 HU's. Axial non-contrast 3 mm slices were carried out through the heart. The data set was analyzed on a dedicated work station and scored using the [place]. Gantry rotation speed was 250 msecs and collimation was .6 mm. No beta blockade and 0.8 mg of sl NTG was given. The 3D data set was reconstructed in 5% intervals of the 67-82 % of the R-R cycle. Diastolic phases were analyzed on a dedicated work station using MPR, MIP and VRT modes. The patient received 80 cc of contrast. Coronary Arteries:  Normal coronary origin.  Right dominance. RCA is a large dominant artery that gives rise to PDA and PLA. There is no plaque. Left main is a large artery that gives rise to LAD and LCX arteries. LAD is a large vessel that has no plaque. LCX is a non-dominant artery that gives rise to one large OM1 branch. There is no plaque. There is a ramus with no plaque. Other findings: Left Ventricle: Normal size. PFO Left Atrium: Mild enlargement Pulmonary Veins: Normal configuration Right Ventricle: Mild enlargement Right Atrium: Mild enlargement Cardiac valves: No calcifications Thoracic aorta: Mild dilatation ascending aorta measuring 43mm Pulmonary Arteries: Normal size Systemic Veins: Normal drainage Pericardium: Normal thickness IMPRESSION: 1. Coronary calcium score of 0. 2. Normal coronary origin with right dominance. 3. No evidence of CAD. CAD-RADS 0. No evidence of CAD (0%). Consider non-atherosclerotic causes of chest pain. Electronically Signed: By: Oswaldo Milian M.D. On: 09/22/2021 14:57   VAS [place] TRANSCRANIAL DOPPLER W BUBBLES  Result Date: 09/20/2021  Transcranial Doppler with Bubble [name]  Date of Exam:    09/19/2021 Medical Rec #: [id number]        Accession #:    [id number] Date of Birth: 10-23-1941        Patient Gender: M Patient Age:   25 years Exam Location:  [facility] Procedure:      VAS [place] TRANSCRANIAL DOPPLER W BUBBLES Referring Phys: Elwin Sleight [place] --------------------------------------------------------------------------------  Indications: Stroke. Comparison Study: 09-17-2021 Echo complete w/ bubble was positive for                   intracardiac shunting. Performing Technologist: Darlin Coco RDMS, RVT  Examination Guidelines: A complete evaluation includes B-mode imaging, spectral Doppler, color Doppler, and power Doppler as needed of all accessible portions of each vessel. Bilateral testing is considered an integral part of a complete examination. Limited examinations for reoccurring indications may be performed as noted.  Summary:  A vascular evaluation was performed. The right middle cerebral artery was studied. An IV was inserted into the patient's left forearm. Verbal informed consent was obtained.  Between 15 and 30 high intensity transient signals (HITS) were observed at rest, and between 30 and 100 were observed with valsalva, indicating a Spencer grade 3 patent foramen ovale (PFO). Positive TCD Bubble study indicative of a medium size right to left shunt *See table(s) above for TCD measurements and observations.  Diagnosing physician: Antony Contras MD Electronically signed by Antony Contras MD on 09/20/2021 at 5:55:43 PM.    Final    [place] CORE BIOPSY (LIVER)  Result Date: 09/23/2021 INDICATION: Multiple liver masses EXAM: Ultrasound-guided right liver mass biopsy MEDICATIONS: None. ANESTHESIA/SEDATION: Moderate (conscious) sedation was employed during this procedure. A total of Versed 0.5 mg and Fentanyl 50 mcg was administered intravenously. Moderate Sedation Time: 16 minutes. The patient's level of consciousness and vital signs were monitored continuously by radiology nursing  throughout the procedure under my direct supervision. COMPLICATIONS: None immediate. PROCEDURE: Informed written consent was obtained from the patient after a thorough discussion of the procedural risks, benefits and alternatives. All questions were addressed. Maximal Sterile Barrier Technique was utilized including caps, mask, sterile gowns, sterile gloves, sterile drape, hand hygiene and skin antiseptic. A timeout was performed prior to the initiation of the procedure. Patient position supine on the ultrasound table. Right upper quadrant skin prepped and draped in usual sterile fashion. Following local lidocaine administration, 17 gauge introducer needle was advanced into the right hepatic lobe, and four 18 gauge cores were obtained utilizing continuous ultrasound guidance. Gelfoam slurry was administered through the introducer needle at the biopsy site. Samples were sent to pathology in formalin. Needle removed and hemostasis achieved with 5 minutes of manual compression. Post procedure ultrasound images showed no evidence of significant hemorrhage. IMPRESSION: Ultrasound-guided biopsy of right liver mass as above. Electronically Signed   By: Miachel Roux M.D.   On: 09/23/2021 13:49   ECHOCARDIOGRAM COMPLETE BUBBLE STUDY  Result Date: 09/17/2021    ECHOCARDIOGRAM REPORT   Patient Name:   Robert Warner Date of Exam: 09/17/2021 Medical Rec #:  [id number]       Height:       70.0 in Accession #:    [id number]      Weight:       200.0 lb Date of Birth:  01-06-1941       BSA:          2.087 m Patient Age:    49 years        BP:           133/95 mmHg Patient Gender: M               HR:           67 bpm. Exam Location:  Inpatient Procedure: 2D Echo, Cardiac Doppler, Color Doppler and Intracardiac            Opacification Agent Indications:  Stroke 434.91 / I63.9  History:        Patient has no prior history of Echocardiogram examinations.                 Risk Factors:Hypertension, Diabetes and Dyslipidemia.   Sonographer:    Darlina Sicilian RDCS Referring Phys: [id number] [place]  1. Left ventricular ejection fraction, by estimation, is 60 to 65%. The left ventricle has normal function. The left ventricle has no regional wall motion abnormalities. There is mild left ventricular hypertrophy. Left ventricular diastolic parameters are consistent with Grade I diastolic dysfunction (impaired relaxation).  2. Right ventricular systolic function is normal. The right ventricular size is normal.  3. The mitral valve is normal in structure. No evidence of mitral valve regurgitation. No evidence of mitral stenosis.  4. The aortic valve is tricuspid. Aortic valve regurgitation is not visualized. No aortic stenosis is present.  5. Aortic dilatation noted. There is mild dilatation of the ascending aorta, measuring 38 mm.  6. Agitated saline contrast bubble study was positive with shunting observed within 3-6 cardiac cycles suggestive of interatrial shunt. FINDINGS  Left Ventricle: Left ventricular ejection fraction, by estimation, is 60 to 65%. The left ventricle has normal function. The left ventricle has no regional wall motion abnormalities. Definity contrast agent was given IV to delineate the left ventricular  endocardial borders. The left ventricular internal cavity size was normal in size. There is mild left ventricular hypertrophy. Left ventricular diastolic parameters are consistent with Grade I diastolic dysfunction (impaired relaxation). Normal left ventricular filling pressure. Right Ventricle: The right ventricular size is normal. No increase in right ventricular wall thickness. Right ventricular systolic function is normal. Left Atrium: Left atrial size was normal in size. Right Atrium: Right atrial size was normal in size. Pericardium: There is no evidence of pericardial effusion. Mitral Valve: The mitral valve is normal in structure. No evidence of mitral valve regurgitation. No evidence of mitral  valve stenosis. Tricuspid Valve: The tricuspid valve is normal in structure. Tricuspid valve regurgitation is not demonstrated. No evidence of tricuspid stenosis. Aortic Valve: The aortic valve is tricuspid. Aortic valve regurgitation is not visualized. No aortic stenosis is present. Aortic valve mean gradient measures 2.6 mmHg. Aortic valve peak gradient measures 5.5 mmHg. Aortic valve area, by VTI measures 3.14 cm. Pulmonic Valve: The pulmonic valve was not well visualized. Pulmonic valve regurgitation is not visualized. No evidence of pulmonic stenosis. Aorta: The aortic root is normal in size and structure and aortic dilatation noted. There is mild dilatation of the ascending aorta, measuring 38 mm. IAS/Shunts: The interatrial septum was not well visualized. Agitated saline contrast was given intravenously to evaluate for intracardiac shunting. Agitated saline contrast bubble study was positive with shunting observed within 3-6 cardiac cycles suggestive of interatrial shunt.  LEFT VENTRICLE PLAX 2D LVIDd:         4.00 cm   Diastology LVIDs:         2.90 cm   LV e' medial:    4.68 cm/s LV PW:         1.00 cm   LV E/e' medial:  12.0 LV IVS:        1.20 cm   LV e' lateral:   5.77 cm/s LVOT diam:     2.40 cm   LV E/e' lateral: 9.8 LV SV:         74 LV SV Index:   35 LVOT Area:     4.52 cm  RIGHT  VENTRICLE RV S prime:     12.80 cm/s LEFT ATRIUM             Index       RIGHT ATRIUM          Index LA diam:        3.10 cm 1.49 cm/m  RA Area:     7.64 cm LA Vol (A2C):   15.3 ml 7.33 ml/m  RA Volume:   10.60 ml 5.08 ml/m LA Vol (A4C):   15.2 ml 7.28 ml/m LA Biplane Vol: 16.2 ml 7.76 ml/m  AORTIC VALVE AV Area (Vmax):    3.30 cm AV Area (Vmean):   3.23 cm AV Area (VTI):     3.14 cm AV Vmax:           117.28 cm/s AV Vmean:          74.266 cm/s AV VTI:            0.235 m AV Peak Grad:      5.5 mmHg AV Mean Grad:      2.6 mmHg LVOT Vmax:         85.52 cm/s LVOT Vmean:        53.105 cm/s LVOT VTI:          0.163 m  LVOT/AV VTI ratio: 0.69  AORTA Ao Root diam: 3.80 cm Ao Asc diam:  3.80 cm MITRAL VALVE MV Area (PHT): 3.23 cm    SHUNTS MV Decel Time: 235 msec    Systemic VTI:  0.16 m MV E velocity: 56.30 cm/s  Systemic Diam: 2.40 cm MV A velocity: 99.40 cm/s MV E/A ratio:  0.57 Carlyle Dolly MD Electronically signed by Carlyle Dolly MD Signature Date/Time: 09/17/2021/11:35:46 AM    Final    VAS [place] LOWER EXTREMITY VENOUS (DVT)  Result Date: 09/18/2021  Lower Venous DVT Study [name]  Date of Exam:   09/18/2021 Medical Rec #: [id number]        Accession #:    [id number] Date of Birth: 1941/06/14        Patient Gender: M Patient Age:   13 years Exam Location:  [facility] Procedure:      VAS [place] LOWER EXTREMITY VENOUS (DVT) Referring Phys: JEFFREY MCCLUNG --------------------------------------------------------------------------------  Indications: Stroke.  Comparison Study: No prior study on file Performing Technologist: Sharion Dove RVS  Examination Guidelines: A complete evaluation includes B-mode imaging, spectral Doppler, color Doppler, and power Doppler as needed of all accessible portions of each vessel. Bilateral testing is considered an integral part of a complete examination. Limited examinations for reoccurring indications may be performed as noted. The reflux portion of the exam is performed with the patient in reverse Trendelenburg.  +---------+---------------+---------+-----------+----------+--------------+ RIGHT    CompressibilityPhasicitySpontaneityPropertiesThrombus Aging +---------+---------------+---------+-----------+----------+--------------+ CFV      Full           Yes      Yes                                 +---------+---------------+---------+-----------+----------+--------------+ SFJ      Full                                                        +---------+---------------+---------+-----------+----------+--------------+ FV  Prox  Full                                                         +---------+---------------+---------+-----------+----------+--------------+ FV Mid   Full                                                        +---------+---------------+---------+-----------+----------+--------------+ FV DistalFull                                                        +---------+---------------+---------+-----------+----------+--------------+ PFV      Full                                                        +---------+---------------+---------+-----------+----------+--------------+ POP      Full           Yes      Yes                                 +---------+---------------+---------+-----------+----------+--------------+ PTV      Full                                                        +---------+---------------+---------+-----------+----------+--------------+ PERO     Full                                                        +---------+---------------+---------+-----------+----------+--------------+   +---------+---------------+---------+-----------+----------+--------------+ LEFT     CompressibilityPhasicitySpontaneityPropertiesThrombus Aging +---------+---------------+---------+-----------+----------+--------------+ CFV      Full           Yes      Yes                                 +---------+---------------+---------+-----------+----------+--------------+ SFJ      Full                                                        +---------+---------------+---------+-----------+----------+--------------+ FV Prox  Full                                                        +---------+---------------+---------+-----------+----------+--------------+  FV Mid   Full                                                        +---------+---------------+---------+-----------+----------+--------------+ FV DistalFull                                                         +---------+---------------+---------+-----------+----------+--------------+ PFV      Full                                                        +---------+---------------+---------+-----------+----------+--------------+ POP      Full           No       Yes                                 +---------+---------------+---------+-----------+----------+--------------+ PTV      Full                                                        +---------+---------------+---------+-----------+----------+--------------+ PERO     Full                                                        +---------+---------------+---------+-----------+----------+--------------+     Summary: BILATERAL: - No evidence of deep vein thrombosis seen in the lower extremities, bilaterally. Bilateral popliteal, posterior tibial, and peroneal veins are dilated with rouleaux flow, but are easily compressible. -No evidence of popliteal cyst, bilaterally.   *See table(s) above for measurements and observations. Electronically signed by Monica Martinez MD on 09/18/2021 at 11:16:48 AM.    Final     Labs:  CBC: Recent Labs    09/18/21 0020 09/20/21 2050 09/21/21 0454 09/23/21 1118  WBC 4.9 5.9 5.1 5.0  HGB 13.6 13.7 13.1 13.7  HCT 41.4 41.8 40.3 41.7  PLT 177 187 195 181    COAGS: Recent Labs    09/16/21 1607 09/23/21 1118  INR 1.1 1.1  APTT 25  --     BMP: Recent Labs    09/21/21 0454 09/22/21 0700 09/23/21 1118 09/29/21 1248  NA 135 135 135 134*  K 4.3 4.4 4.2 4.8  CL 101 103 103 100  CO2 26 26 25 30   GLUCOSE 140* 167* 131* 153*  BUN 18 15 22 21   CALCIUM 9.0 9.0 9.1 10.0  CREATININE 1.17 1.12 1.28* 1.24  GFRNONAA >60 >60 56* 58*    LIVER FUNCTION TESTS: Recent Labs    09/16/21 1607 09/18/21 0020 09/23/21 1118 09/29/21 1248  BILITOT 0.7 0.9 1.0 0.6  AST 28 29 40 37  ALT 19 18 36 29  ALKPHOS 69 68 80 74  PROT 7.0 6.3* 7.3 7.7  ALBUMIN 3.5 3.2* 3.6 4.2    TUMOR  MARKERS: Recent Labs    09/29/21 1248  CEA 33.79*    Assessment and Plan: History of HTN, HLD, DM and adenocarcinoma of the liver with unknown primary.  Patient has been referred by Ned Card, NP for Port-A-Cath placement to begin chemotherapy.  Patient resting quietly on stretcher.  He is alert and oriented, calm and pleasant.  He is in no distress. Patient reports he is NPO per order. No labs needed today VSS  Risks and benefits of image guided port-a-catheter placement was discussed with the patient including, but not limited to bleeding, infection, pneumothorax, or fibrin sheath development and need for additional procedures.  All of the patient's questions were answered, patient is agreeable to proceed. Consent signed and in chart.   Thank you for this interesting consult.  I greatly enjoyed meeting Robert Warner and look forward to participating in their care.  A copy of this report was sent to the requesting provider on this date.  Electronically Signed: Tyson Alias, NP 10/14/2021, 12:29 PM   I spent a total of 30 minutes in face to face in clinical consultation, greater than 50% of which was counseling/coordinating care for Port-A-Cath placement.

## 2021-10-14 NOTE — Therapy (Signed)
[facility] [address], [place], [place], [address] Phone: [phone]   Fax:  [phone]  Physical Therapy Treatment  Patient Details  Name: Robert Warner MRN: [id number] Date of Birth: 1941/09/20 Referring Provider (PT): Antonieta Pert, MD   Encounter Date: 10/14/2021   PT End of Session - 10/14/21 1216     Visit Number 4    Number of Visits 10    Date for PT Re-Evaluation 11/11/21    Authorization Type [employer]    Authorization Time Period 9 visits 11/18-12/30    Authorization - Visit Number 3    Authorization - Number of Visits 9    PT Start Time 0845    PT Stop Time 0928    PT Time Calculation (min) 43 min    Equipment Utilized During Treatment Gait belt    Activity Tolerance Patient tolerated treatment well    Behavior During Therapy [facility] for tasks assessed/performed             Past Medical History:  Diagnosis Date   Arthritis    Cancer ([place])    Cataract    Diabetes mellitus    Hyperlipidemia    Hypertension    Stroke [facility])     Past Surgical History:  Procedure Laterality Date   APPENDECTOMY  1968   CATARACT EXTRACTION Bilateral 07/2000   states 2 weeks ago (first of sept) OD due in Dec    COLONOSCOPY  2004,2009   Negative, Dr. Sharlett Iles    KIDNEY STONE SURGERY  10/2018   PROSTATE BIOPSY  2006   Dr.Sigmund Tannebaum    There were no vitals filed for this visit.   Subjective Assessment - 10/14/21 0840     Subjective Had a scope in his stomach and everything was clear. However, still having abdominal pain. Will be getting a port for chemo today. Not using his cane much, "just in case."    Pertinent History DMT2, recent CVA, HTN, HLD    Diagnostic tests 09/16/21 brain MRI: Multiple small foci of restricted diffusion throughout the bilateral cerebral and cerebellar hemispheres, as well as the right thalamus,  consistent with acute infarcts. Additional subacute infarcts in the  vermis and left aspect of  the splenium of the corpus callosum. Given  different vascular territories, an embolic etiology is favored.    Patient Stated Goals improve balance    Currently in Pain? No/denies                               [facility] Adult PT Treatment/Exercise - 10/14/21 0001       Neuro Re-ed    Neuro Re-ed Details  R/L SLS ring toss game x2 with CGA/min A      Knee/Hip Exercises: Stretches   Other Knee/Hip Stretches R/L sitting fig 4 and KTOS with self OP and cues to avoid pushing into pain 30" each      Knee/Hip Exercises: Aerobic   Nustep L5 x 6 min (UEs/LEs)      Knee/Hip Exercises: Standing   Other Standing Knee Exercises 4 way hip with green TB each LE with 1 UE support 10x each   cueing to maintain upright posture; most cueing required with extension                Balance Exercises - 10/14/21 0001       Balance Exercises: Standing   Other Standing Exercises Comments  Resisted R/L trunk rotation in romberg 10x each with green TB; resisted D2 flexion/extension 10x each with green TB   cues to control movement throughout               PT Education - 10/14/21 1215     Education Details advised to bring [facility] to golf to ambulate on grassy surfaces and have someone supervise for the first couple of games to ensure max safety    Person(s) Educated Patient    Methods Explanation    Comprehension Verbalized understanding              PT Short Term Goals - 10/14/21 1227       PT SHORT TERM GOAL #1   Title Patient to be independent with initial HEP.    Time 3    Period Weeks    Status Achieved    Target Date 10/21/21               PT Long Term Goals - 10/03/21 1239       PT LONG TERM GOAL #1   Title Patient to be independent with advanced HEP.    Time 6    Period Weeks    Status On-going      PT LONG TERM GOAL #2   Title Patient to complete TUG in <14 sec with LRAD in order to decrease risk of falls.    Time 6    Period Weeks     Status Achieved      PT LONG TERM GOAL #3   Title Patient to score at least 23/30 on FGA in order to decrease risk of falls.    Time 6    Period Weeks    Status On-going      PT LONG TERM GOAL #4   Title Patient to demonstrate B LE strength >/=4+/5.    Time 6    Period Weeks    Status On-going      PT LONG TERM GOAL #5   Title Patient will ambulate over outdoor surfaces with LRAD while performing head turns to scan environment with good stability in order to indicate safe community mobility.    Time 6    Period Weeks    Status On-going                   Plan - 10/14/21 1216     Clinical Impression Statement Patient arrived to session with reports of having had an Endoscopy which was clear and notes that he will be getting a port put in today chemo. Arrived ambulating with SPC in hand, but not using it consistently. Initiated dynamic balance activities using resistance bands to simulate golf swing and assess patient's safety with these activities. Patient demonstrated good stability and control throughout. Continued working on progressive LE strengthening ther-ex with intermittent cues for form. Patient reported muscle fatigue which was eased with hip stretching. D/t patient's performance in today's and previous sessions, advised patient to return to golf with SPC to ambulate on grassy surfaces and to have someone there for supervisor for the first couple games since his hospitalization. Patient reported understanding. Standing dynamic balance exercises were performed with CGA/min A d/t imbalance. No complaints at end of session.    Comorbidities DMT2, recent CVA, HTN, HLD    PT Treatment/Interventions ADLs/Self Care Home Management;Canalith Repostioning;Cryotherapy;Electrical Stimulation;DME Instruction;Ultrasound;Moist Heat;Gait training;Stair training;Functional mobility training;Therapeutic activities;Therapeutic exercise;Balance training;Neuromuscular re-education;Manual  techniques;Patient/family education;Passive range of motion;Dry needling;Energy conservation;Vestibular;Vasopneumatic Device;Taping  PT Next Visit Plan progress L LE strengthening and higher level balance; gait training with/without SPC outside    Consulted and Agree with Plan of Care Patient             Patient will benefit from skilled therapeutic intervention in order to improve the following deficits and impairments:  Abnormal gait, Decreased coordination, Decreased range of motion, Decreased safety awareness, Decreased activity tolerance, Decreased balance, Impaired flexibility, Improper body mechanics, Postural dysfunction, Decreased strength  Visit Diagnosis: Unsteadiness on feet  Muscle weakness (generalized)  Other abnormalities of gait and mobility  Abnormal posture     Problem List Patient Active Problem List   Diagnosis Date Noted   Cancer with unknown primary site ([place]) 10/11/2021   Goals of care, counseling/discussion 10/11/2021   Chest pain 09/21/2021   Recent cerebrovascular accident (CVA) 09/21/2021   Interatrial cardiac shunt 09/21/2021   CKD (chronic kidney disease) stage 3, GFR 30-59 ml/min ([place]) 17/71/1657   Acute embolic stroke ([place]) 90/38/3338   Mixed diabetic hyperlipidemia associated with type 2 diabetes mellitus ([place]) 09/16/2021   Liver masses 09/16/2021   Essential hypertension 03/12/2012   Type 2 diabetes mellitus without complication, without long-term current use of insulin ([place]) 06/15/2008   BPH (benign prostatic hyperplasia) 06/15/2008    Janene Harvey, PT, DPT 10/14/21 12:28 PM    [facility] [address] [place], [place], [address] Phone: [phone]   Fax:  [phone]  Name: Robert Warner MRN: [id number] Date of Birth: 1941-10-09

## 2021-10-14 NOTE — Procedures (Signed)
Interventional Radiology Procedure Note  Procedure: Chest port  Indication: Hepatic malignancy  Findings: Please refer to procedural dictation for full description.  Complications: None  EBL: < 10 mL  Miachel Roux, MD [phone]

## 2021-10-14 NOTE — Discharge Instructions (Signed)

## 2021-10-15 ENCOUNTER — Other Ambulatory Visit: Payer: Self-pay | Admitting: Oncology

## 2021-10-17 ENCOUNTER — Inpatient Hospital Stay: Payer: Medicare HMO | Attending: Oncology

## 2021-10-17 ENCOUNTER — Ambulatory Visit: Payer: Medicare HMO | Admitting: Physical Therapy

## 2021-10-17 ENCOUNTER — Inpatient Hospital Stay: Payer: Medicare HMO

## 2021-10-17 ENCOUNTER — Telehealth: Payer: Self-pay

## 2021-10-17 ENCOUNTER — Other Ambulatory Visit: Payer: Self-pay

## 2021-10-17 DIAGNOSIS — I129 Hypertensive chronic kidney disease with stage 1 through stage 4 chronic kidney disease, or unspecified chronic kidney disease: Secondary | ICD-10-CM | POA: Insufficient documentation

## 2021-10-17 DIAGNOSIS — C787 Secondary malignant neoplasm of liver and intrahepatic bile duct: Secondary | ICD-10-CM | POA: Insufficient documentation

## 2021-10-17 DIAGNOSIS — Z452 Encounter for adjustment and management of vascular access device: Secondary | ICD-10-CM | POA: Insufficient documentation

## 2021-10-17 DIAGNOSIS — R2689 Other abnormalities of gait and mobility: Secondary | ICD-10-CM | POA: Insufficient documentation

## 2021-10-17 DIAGNOSIS — N189 Chronic kidney disease, unspecified: Secondary | ICD-10-CM | POA: Insufficient documentation

## 2021-10-17 DIAGNOSIS — R2681 Unsteadiness on feet: Secondary | ICD-10-CM | POA: Insufficient documentation

## 2021-10-17 DIAGNOSIS — R293 Abnormal posture: Secondary | ICD-10-CM | POA: Diagnosis not present

## 2021-10-17 DIAGNOSIS — E1122 Type 2 diabetes mellitus with diabetic chronic kidney disease: Secondary | ICD-10-CM | POA: Insufficient documentation

## 2021-10-17 DIAGNOSIS — Z5111 Encounter for antineoplastic chemotherapy: Secondary | ICD-10-CM | POA: Insufficient documentation

## 2021-10-17 DIAGNOSIS — M6281 Muscle weakness (generalized): Secondary | ICD-10-CM | POA: Diagnosis not present

## 2021-10-17 DIAGNOSIS — C801 Malignant (primary) neoplasm, unspecified: Secondary | ICD-10-CM | POA: Diagnosis not present

## 2021-10-17 DIAGNOSIS — C229 Malignant neoplasm of liver, not specified as primary or secondary: Secondary | ICD-10-CM

## 2021-10-17 LAB — CBC WITH DIFFERENTIAL (CANCER CENTER ONLY)
Abs Immature Granulocytes: 0.02 10*3/uL (ref 0.00–0.07)
Basophils Absolute: 0 10*3/uL (ref 0.0–0.1)
Basophils Relative: 0 %
Eosinophils Absolute: 0.1 10*3/uL (ref 0.0–0.5)
Eosinophils Relative: 2 %
HCT: 40.7 % (ref 39.0–52.0)
Hemoglobin: 13.4 g/dL (ref 13.0–17.0)
Immature Granulocytes: 0 %
Lymphocytes Relative: 26 %
Lymphs Abs: 1.3 10*3/uL (ref 0.7–4.0)
MCH: 27.8 pg (ref 26.0–34.0)
MCHC: 32.9 g/dL (ref 30.0–36.0)
MCV: 84.4 fL (ref 80.0–100.0)
Monocytes Absolute: 0.5 10*3/uL (ref 0.1–1.0)
Monocytes Relative: 10 %
Neutro Abs: 3.1 10*3/uL (ref 1.7–7.7)
Neutrophils Relative %: 62 %
Platelet Count: 190 10*3/uL (ref 150–400)
RBC: 4.82 MIL/uL (ref 4.22–5.81)
RDW: 13.1 % (ref 11.5–15.5)
WBC Count: 5 10*3/uL (ref 4.0–10.5)
nRBC: 0 % (ref 0.0–0.2)

## 2021-10-17 LAB — CMP (CANCER CENTER ONLY)
ALT: 34 U/L (ref 0–44)
AST: 41 U/L (ref 15–41)
Albumin: 4 g/dL (ref 3.5–5.0)
Alkaline Phosphatase: 97 U/L (ref 38–126)
Anion gap: 9 (ref 5–15)
BUN: 19 mg/dL (ref 8–23)
CO2: 26 mmol/L (ref 22–32)
Calcium: 9.9 mg/dL (ref 8.9–10.3)
Chloride: 99 mmol/L (ref 98–111)
Creatinine: 1.08 mg/dL (ref 0.61–1.24)
GFR, Estimated: >60 mL/min (ref >60)
Glucose, Bld: 259 mg/dL — ABNORMAL HIGH (ref 70–99)
Potassium: 4.2 mmol/L (ref 3.5–5.1)
Sodium: 134 mmol/L — ABNORMAL LOW (ref 135–145)
Total Bilirubin: 0.9 mg/dL (ref 0.3–1.2)
Total Protein: 7.1 g/dL (ref 6.5–8.1)

## 2021-10-17 MED ORDER — ONDANSETRON HCL 8 MG PO TABS: 8.0000 mg | ORAL_TABLET | Freq: Three times a day (TID) | ORAL | 1 refills | Status: AC | PRN

## 2021-10-17 MED ORDER — PROCHLORPERAZINE MALEATE 10 MG PO TABS: 10.0000 mg | ORAL_TABLET | Freq: Four times a day (QID) | ORAL | 1 refills | Status: AC | PRN

## 2021-10-17 MED ORDER — LIDOCAINE-PRILOCAINE 2.5-2.5 % EX CREA: 1.0000 "application " | TOPICAL_CREAM | CUTANEOUS | 1 refills | Status: AC | PRN

## 2021-10-17 NOTE — Telephone Encounter (Signed)
Patient returned your call, please advise. 

## 2021-10-17 NOTE — Progress Notes (Signed)
Expresses concern that he is having constant hard pain in stomach area that is not responding to Tramadol. He insists the Tramadol is 20 mg tablet. Informed him that Tramadol does not come in 20 mg, only 50 mg. After much discussion w/wife it was determined he was only taking the 20 mg pantoprazole daily and had not taken Tramadol yet. Encouraged him to take a Tramadol when he gets home and call if it does not help his pain. He agrees. Seemed to be some tension between he and his wife during education session today. He would not allow her to look at papers during class. She reports "he's in charge" to nurse and rolled her eyes. She was made aware of caregiver support group.

## 2021-10-17 NOTE — Telephone Encounter (Signed)
  Follow up Call-  Call back number 10/13/2021  Post procedure Call Back phone  # [phone]  Permission to leave phone message Yes  Some recent data might be hidden     Patient questions:  Do you have a fever, pain , or abdominal swelling? No. Pain Score  0 *  Have you tolerated food without any problems? Yes.    Have you been able to return to your normal activities? Yes.    Do you have any questions about your discharge instructions: Diet   No. Medications  No. Follow up visit  No.  Do you have questions or concerns about your Care? No.  Actions: * If pain score is 4 or above: No action needed, pain <4. Have you developed a fever since your procedure? no  2.   Have you had an respiratory symptoms (SOB or cough) since your procedure? no  3.   Have you tested positive for COVID 19 since your procedure no  4.   Have you had any family members/close contacts diagnosed with the COVID 19 since your procedure?  no   If yes to any of these questions please route to Joylene John, RN and Joella Prince, RN

## 2021-10-17 NOTE — Telephone Encounter (Signed)
First attempt follow up call to pt,  No answer

## 2021-10-18 ENCOUNTER — Encounter: Payer: Self-pay | Admitting: Family Medicine

## 2021-10-19 ENCOUNTER — Inpatient Hospital Stay: Payer: Medicare HMO | Admitting: Nutrition

## 2021-10-19 ENCOUNTER — Other Ambulatory Visit: Payer: Self-pay

## 2021-10-19 ENCOUNTER — Telehealth: Payer: Self-pay

## 2021-10-19 ENCOUNTER — Encounter: Payer: Self-pay | Admitting: *Deleted

## 2021-10-19 ENCOUNTER — Inpatient Hospital Stay (HOSPITAL_BASED_OUTPATIENT_CLINIC_OR_DEPARTMENT_OTHER): Payer: Medicare HMO | Admitting: Oncology

## 2021-10-19 ENCOUNTER — Inpatient Hospital Stay: Payer: Medicare HMO

## 2021-10-19 ENCOUNTER — Encounter: Payer: Self-pay | Admitting: Family Medicine

## 2021-10-19 VITALS — BP 148/91 | HR 80

## 2021-10-19 VITALS — BP 127/88 | HR 94 | Temp 98.1°F | Resp 20 | Ht 70.0 in | Wt 194.0 lb

## 2021-10-19 DIAGNOSIS — C801 Malignant (primary) neoplasm, unspecified: Secondary | ICD-10-CM

## 2021-10-19 DIAGNOSIS — C787 Secondary malignant neoplasm of liver and intrahepatic bile duct: Secondary | ICD-10-CM | POA: Diagnosis not present

## 2021-10-19 DIAGNOSIS — M6281 Muscle weakness (generalized): Secondary | ICD-10-CM | POA: Diagnosis not present

## 2021-10-19 DIAGNOSIS — A048 Other specified bacterial intestinal infections: Secondary | ICD-10-CM

## 2021-10-19 DIAGNOSIS — N189 Chronic kidney disease, unspecified: Secondary | ICD-10-CM | POA: Diagnosis not present

## 2021-10-19 DIAGNOSIS — Z452 Encounter for adjustment and management of vascular access device: Secondary | ICD-10-CM | POA: Diagnosis not present

## 2021-10-19 DIAGNOSIS — E1122 Type 2 diabetes mellitus with diabetic chronic kidney disease: Secondary | ICD-10-CM | POA: Diagnosis not present

## 2021-10-19 DIAGNOSIS — Z5111 Encounter for antineoplastic chemotherapy: Secondary | ICD-10-CM | POA: Diagnosis not present

## 2021-10-19 DIAGNOSIS — I129 Hypertensive chronic kidney disease with stage 1 through stage 4 chronic kidney disease, or unspecified chronic kidney disease: Secondary | ICD-10-CM | POA: Diagnosis not present

## 2021-10-19 DIAGNOSIS — R2681 Unsteadiness on feet: Secondary | ICD-10-CM | POA: Diagnosis not present

## 2021-10-19 MED ORDER — FLUOROURACIL CHEMO INJECTION 2.5 GM/50ML
400.0000 mg/m2 | Freq: Once | INTRAVENOUS | Status: AC
  Administered 2021-10-19: 850 mg via INTRAVENOUS
  Filled 2021-10-19: qty 17

## 2021-10-19 MED ORDER — DEXTROSE 5 % IV SOLN: Freq: Once | INTRAVENOUS | Status: AC

## 2021-10-19 MED ORDER — AMOXICILLIN 500 MG PO TABS: 1000.0000 mg | ORAL_TABLET | Freq: Two times a day (BID) | ORAL | 0 refills | Status: DC

## 2021-10-19 MED ORDER — CLOPIDOGREL BISULFATE 75 MG PO TABS: 75.0000 mg | ORAL_TABLET | Freq: Every day | ORAL | 1 refills | Status: AC

## 2021-10-19 MED ORDER — OXALIPLATIN CHEMO INJECTION 100 MG/20ML
85.0000 mg/m2 | Freq: Once | INTRAVENOUS | Status: AC
  Administered 2021-10-19: 175 mg via INTRAVENOUS
  Filled 2021-10-19: qty 35

## 2021-10-19 MED ORDER — LEUCOVORIN CALCIUM INJECTION 350 MG
400.0000 mg/m2 | Freq: Once | INTRAVENOUS | Status: AC
  Administered 2021-10-19: 832 mg via INTRAVENOUS
  Filled 2021-10-19: qty 41.6

## 2021-10-19 MED ORDER — SODIUM CHLORIDE 0.9 % IV SOLN
10.0000 mg | Freq: Once | INTRAVENOUS | Status: AC
  Administered 2021-10-19: 10 mg via INTRAVENOUS
  Filled 2021-10-19: qty 1

## 2021-10-19 MED ORDER — CLARITHROMYCIN 500 MG PO TABS: 500.0000 mg | ORAL_TABLET | Freq: Two times a day (BID) | ORAL | 0 refills | Status: DC

## 2021-10-19 MED ORDER — PANTOPRAZOLE SODIUM 20 MG PO TBEC: 20.0000 mg | DELAYED_RELEASE_TABLET | Freq: Two times a day (BID) | ORAL | 0 refills | Status: DC

## 2021-10-19 MED ORDER — ATORVASTATIN CALCIUM 40 MG PO TABS
40.0000 mg | ORAL_TABLET | Freq: Every day | ORAL | 1 refills | Status: AC
Start: 2021-10-19 — End: 2022-04-28

## 2021-10-19 MED ORDER — PALONOSETRON HCL INJECTION 0.25 MG/5ML
0.2500 mg | Freq: Once | INTRAVENOUS | Status: AC
  Administered 2021-10-19: 0.25 mg via INTRAVENOUS
  Filled 2021-10-19: qty 5

## 2021-10-19 MED ORDER — METRONIDAZOLE 500 MG PO TABS: 500.0000 mg | ORAL_TABLET | Freq: Two times a day (BID) | ORAL | 0 refills | Status: DC

## 2021-10-19 MED ORDER — SODIUM CHLORIDE 0.9 % IV SOLN
2000.0000 mg/m2 | INTRAVENOUS | Status: DC
  Administered 2021-10-19: 4150 mg via INTRAVENOUS
  Filled 2021-10-19: qty 83

## 2021-10-19 MED ORDER — ASPIRIN EC 81 MG PO TBEC: 81.0000 mg | DELAYED_RELEASE_TABLET | Freq: Every day | ORAL | 1 refills | Status: DC

## 2021-10-19 NOTE — Telephone Encounter (Signed)
Patient seen by Dr. Sherrill today ? ?Vitals are within treatment parameters. ? ?Labs reviewed by Dr. Sherrill and are within treatment parameters. ? ?Per physician team, patient is ready for treatment and there are NO modifications to the treatment plan.  ?

## 2021-10-19 NOTE — Progress Notes (Signed)
Patient presents for treatment. RN assessment completed along with the following:  Labs/vitals reviewed - Yes, and within treatment parameters.   Weight within 10% of previous measurement - Yes Oncology Treatment Attestation completed for current therapy- Yes, on date 10/11/21 Informed consent completed and reflects current therapy/intent - Yes, on date 10/19/21             Provider progress note reviewed - Yes, today's provider note was reviewed. Treatment/Antibody/Supportive plan reviewed - Yes, and there are no adjustments needed for today's treatment. S&H and other orders reviewed - Yes, and there are no additional orders identified. Previous treatment date reviewed - Yes, and the appropriate amount of time has elapsed between treatments. Clinic Hand Off Received from - Danae Orleans, LPN  Patient to proceed with treatment.

## 2021-10-19 NOTE — Progress Notes (Signed)
Pharmacist Chemotherapy Monitoring - Initial Assessment    Anticipated start date: 10/19/21   The following has been reviewed per standard work regarding the patient's treatment regimen: The patient's diagnosis, treatment plan and drug doses, and organ/hematologic function Lab orders and baseline tests specific to treatment regimen  The treatment plan start date, drug sequencing, and pre-medications Prior authorization status  Patient's documented medication list, including drug-drug interaction screen and prescriptions for anti-emetics and supportive care specific to the treatment regimen The drug concentrations, fluid compatibility, administration routes, and timing of the medications to be used The patient's access for treatment and lifetime cumulative dose history, if applicable  The patient's medication allergies and previous infusion related reactions, if applicable   Changes made to treatment plan:  N/A  Follow up needed:  N/A   Patrica Duel, RPH, 10/19/2021  7:48 AM

## 2021-10-19 NOTE — Progress Notes (Signed)
I[employer] testing ordered on Accession# [id number] today

## 2021-10-19 NOTE — Telephone Encounter (Signed)
-----   Message from Yetta Flock, MD sent at 10/19/2021  2:08 PM EST ----- [facility] can you help relay the following: -EGD showed H. pylori gastritis causing the appearance on endoscopy. In this light I am recommend the following treatment regimen for 14 days: Amoxicillin 1gm BID Clarithromycin 500mg  BID Flagyl 500mg  BID Protonix 20mg BID   Can you please order this if no signfiicant interactions noted.  He will need to hold Lipitor while taking clarithromycin.  Once done with therapy, the patient should wait one month and perform a stool study for H pylori antigen to ensure negative. The PPI needs to be held at least 2 weeks prior to submitting the stool sample. The patient should avoid alcohol while taking Flagyl.   Hopefully he can tolerate this okay with his chemotherapy regimen.  If he is too nauseated and intolerant of the antibiotics this may can be delayed until he can tolerate it.  Thanks

## 2021-10-19 NOTE — Telephone Encounter (Signed)
Patients wife is returning your call

## 2021-10-19 NOTE — Patient Instructions (Addendum)
[facility]   The chemotherapy medication bag should finish at 46 hours, 96 hours, or 7 days. For example, if your pump is scheduled for 46 hours and it was put on at 4:00 p.m., it should finish at 2:00 p.m. the day it is scheduled to come off regardless of your appointment time.     Estimated time to finish at  2:00 Friday, 10/21/21.   If the display on your pump reads "Low Volume" and it is beeping, take the batteries out of the pump and come to the cancer center for it to be taken off.   If the pump alarms go off prior to the pump reading "Low Volume" then call [phone] and someone can assist you.  If the plunger comes out and the chemotherapy medication is leaking out, please use your home chemo spill kit to clean up the spill. Do NOT use paper towels or other household products.  If you have problems or questions regarding your pump, please call either [phone] (24 hours a day) or the cancer center Monday-Friday 8:00 a.m.- 4:30 p.m. at the clinic number and we will assist you. If you are unable to get assistance, then go to the nearest Emergency Department and ask the staff to contact the IV team for assistance.  Discharge Instructions: Thank you for choosing [facility] to provide your oncology and hematology care.   If you have a lab appointment with the [facility], please go directly to the [facility] and check in at the registration area.   Wear comfortable clothing and clothing appropriate for easy access to any Portacath or PICC line.   We strive to give you quality time with your provider. You may need to reschedule your appointment if you arrive late (15 or more minutes).  Arriving late affects you and other patients whose appointments are after yours.  Also, if you miss three or more appointments without notifying the office, you may be dismissed from the clinic at the provider's discretion.      For prescription refill  requests, have your pharmacy contact our office and allow 72 hours for refills to be completed.    Today you received the following chemotherapy and/or immunotherapy agents oxaliplatin, leucovorin, fluorouracil      To help prevent nausea and vomiting after your treatment, we encourage you to take your nausea medication as directed.  BELOW ARE SYMPTOMS THAT SHOULD BE REPORTED IMMEDIATELY: *FEVER GREATER THAN 100.4 F (38 C) OR HIGHER *CHILLS OR SWEATING *NAUSEA AND VOMITING THAT IS NOT CONTROLLED WITH YOUR NAUSEA MEDICATION *UNUSUAL SHORTNESS OF BREATH *UNUSUAL BRUISING OR BLEEDING *URINARY PROBLEMS (pain or burning when urinating, or frequent urination) *BOWEL PROBLEMS (unusual diarrhea, constipation, pain near the anus) TENDERNESS IN MOUTH AND THROAT WITH OR WITHOUT PRESENCE OF ULCERS (sore throat, sores in mouth, or a toothache) UNUSUAL RASH, SWELLING OR PAIN  UNUSUAL VAGINAL DISCHARGE OR ITCHING   Items with * indicate a potential emergency and should be followed up as soon as possible or go to the Emergency Department if any problems should occur.  Please show the CHEMOTHERAPY ALERT CARD or IMMUNOTHERAPY ALERT CARD at check-in to the Emergency Department and triage nurse.  Should you have questions after your visit or need to cancel or reschedule your appointment, please contact [facility]  Dept: [phone]  and follow the prompts.  Office hours are 8:00 a.m. to 4:30 p.m. Monday - Friday. Please note that voicemails left  after 4:00 p.m. may not be returned until the following business day.  We are closed weekends and major holidays. You have access to a nurse at all times for urgent questions. Please call the main number to the clinic Dept: [phone] and follow the prompts.   For any non-urgent questions, you may also contact your provider using MyChart. We now offer e-Visits for anyone 82 and older to request care online for non-urgent symptoms. For  details visit [email or web address].   Also download the MyChart app! Go to the app store, search "MyChart", open the app, select [facility], and log in with your MyChart username and password.  Due to Covid, a mask is required upon entering the hospital/clinic. If you do not have a mask, one will be given to you upon arrival. For doctor visits, patients may have 1 support person aged 2 or older with them. For treatment visits, patients cannot have anyone with them due to current Covid guidelines and our immunocompromised population.   Oxaliplatin Injection What is this medication? OXALIPLATIN (ox AL i PLA tin) is a chemotherapy drug. It targets fast dividing cells, like cancer cells, and causes these cells to die. This medicine is used to treat cancers of the colon and rectum, and many other cancers. This medicine may be used for other purposes; ask your health care provider or pharmacist if you have questions. COMMON BRAND NAME(S): Eloxatin What should I tell my care team before I take this medication? They need to know if you have any of these conditions: heart disease history of irregular heartbeat liver disease low blood counts, like white cells, platelets, or red blood cells lung or breathing disease, like asthma take medicines that treat or prevent blood clots tingling of the fingers or toes, or other nerve disorder an unusual or allergic reaction to oxaliplatin, other chemotherapy, other medicines, foods, dyes, or preservatives pregnant or trying to get pregnant breast-feeding How should I use this medication? This drug is given as an infusion into a vein. It is administered in a hospital or clinic by a specially trained health care professional. Talk to your pediatrician regarding the use of this medicine in children. Special care may be needed. Overdosage: If you think you have taken too much of this medicine contact a poison control center or emergency room at once. NOTE:  This medicine is only for you. Do not share this medicine with others. What if I miss a dose? It is important not to miss a dose. Call your doctor or health care professional if you are unable to keep an appointment. What may interact with this medication? Do not take this medicine with any of the following medications: cisapride dronedarone pimozide thioridazine This medicine may also interact with the following medications: aspirin and aspirin-like medicines certain medicines that treat or prevent blood clots like warfarin, apixaban, dabigatran, and rivaroxaban cisplatin cyclosporine diuretics medicines for infection like acyclovir, adefovir, amphotericin B, bacitracin, cidofovir, foscarnet, ganciclovir, gentamicin, pentamidine, vancomycin NSAIDs, medicines for pain and inflammation, like ibuprofen or naproxen other medicines that prolong the QT interval (an abnormal heart rhythm) pamidronate zoledronic acid This list may not describe all possible interactions. Give your health care provider a list of all the medicines, herbs, non-prescription drugs, or dietary supplements you use. Also tell them if you smoke, drink alcohol, or use illegal drugs. Some items may interact with your medicine. What should I watch for while using this medication? Your condition will be monitored carefully while you are receiving this medicine.  You may need blood work done while you are taking this medicine. This medicine may make you feel generally unwell. This is not uncommon as chemotherapy can affect healthy cells as well as cancer cells. Report any side effects. Continue your course of treatment even though you feel ill unless your healthcare professional tells you to stop. This medicine can make you more sensitive to cold. Do not drink cold drinks or use ice. Cover exposed skin before coming in contact with cold temperatures or cold objects. When out in cold weather wear warm clothing and cover your mouth  and nose to warm the air that goes into your lungs. Tell your doctor if you get sensitive to the cold. Do not become pregnant while taking this medicine or for 9 months after stopping it. Women should inform their health care professional if they wish to become pregnant or think they might be pregnant. Men should not father a child while taking this medicine and for 6 months after stopping it. There is potential for serious side effects to an unborn child. Talk to your health care professional for more information. Do not breast-feed a child while taking this medicine or for 3 months after stopping it. This medicine has caused ovarian failure in some women. This medicine may make it more difficult to get pregnant. Talk to your health care professional if you are concerned about your fertility. This medicine has caused decreased sperm counts in some men. This may make it more difficult to father a child. Talk to your health care professional if you are concerned about your fertility. This medicine may increase your risk of getting an infection. Call your health care professional for advice if you get a fever, chills, or sore throat, or other symptoms of a cold or flu. Do not treat yourself. Try to avoid being around people who are sick. Avoid taking medicines that contain aspirin, acetaminophen, ibuprofen, naproxen, or ketoprofen unless instructed by your health care professional. These medicines may hide a fever. Be careful brushing or flossing your teeth or using a toothpick because you may get an infection or bleed more easily. If you have any dental work done, tell your dentist you are receiving this medicine. What side effects may I notice from receiving this medication? Side effects that you should report to your doctor or health care professional as soon as possible: allergic reactions like skin rash, itching or hives, swelling of the face, lips, or tongue breathing problems cough low blood counts  - this medicine may decrease the number of white blood cells, red blood cells, and platelets. You may be at increased risk for infections and bleeding nausea, vomiting pain, redness, or irritation at site where injected pain, tingling, numbness in the hands or feet signs and symptoms of bleeding such as bloody or black, tarry stools; red or dark brown urine; spitting up blood or brown material that looks like coffee grounds; red spots on the skin; unusual bruising or bleeding from the eyes, gums, or nose signs and symptoms of a dangerous change in heartbeat or heart rhythm like chest pain; dizziness; fast, irregular heartbeat; palpitations; feeling faint or lightheaded; falls signs and symptoms of infection like fever; chills; cough; sore throat; pain or trouble passing urine signs and symptoms of liver injury like dark yellow or brown urine; general ill feeling or flu-like symptoms; light-colored stools; loss of appetite; nausea; right upper belly pain; unusually weak or tired; yellowing of the eyes or skin signs and symptoms of low  red blood cells or anemia such as unusually weak or tired; feeling faint or lightheaded; falls signs and symptoms of muscle injury like dark urine; trouble passing urine or change in the amount of urine; unusually weak or tired; muscle pain; back pain Side effects that usually do not require medical attention (report to your doctor or health care professional if they continue or are bothersome): changes in taste diarrhea gas hair loss loss of appetite mouth sores This list may not describe all possible side effects. Call your doctor for medical advice about side effects. You may report side effects to FDA at 1-800-FDA-1088. Where should I keep my medication? This drug is given in a hospital or clinic and will not be stored at home. NOTE: This sheet is a summary. It may not cover all possible information. If you have questions about this medicine, talk to your doctor,  pharmacist, or health care provider.  2022 Elsevier/Gold Standard (2021-07-19 00:00:00)  Leucovorin injection What is this medication? LEUCOVORIN (loo koe VOR in) is used to prevent or treat the harmful effects of some medicines. This medicine is used to treat anemia caused by a low amount of folic acid in the body. It is also used with 5-fluorouracil (5-FU) to treat colon cancer. This medicine may be used for other purposes; ask your health care provider or pharmacist if you have questions. What should I tell my care team before I take this medication? They need to know if you have any of these conditions: anemia from low levels of vitamin B-12 in the blood an unusual or allergic reaction to leucovorin, folic acid, other medicines, foods, dyes, or preservatives pregnant or trying to get pregnant breast-feeding How should I use this medication? This medicine is for injection into a muscle or into a vein. It is given by a health care professional in a hospital or clinic setting. Talk to your pediatrician regarding the use of this medicine in children. Special care may be needed. Overdosage: If you think you have taken too much of this medicine contact a poison control center or emergency room at once. NOTE: This medicine is only for you. Do not share this medicine with others. What if I miss a dose? This does not apply. What may interact with this medication? capecitabine fluorouracil phenobarbital phenytoin primidone trimethoprim-sulfamethoxazole This list may not describe all possible interactions. Give your health care provider a list of all the medicines, herbs, non-prescription drugs, or dietary supplements you use. Also tell them if you smoke, drink alcohol, or use illegal drugs. Some items may interact with your medicine. What should I watch for while using this medication? Your condition will be monitored carefully while you are receiving this medicine. This medicine may  increase the side effects of 5-fluorouracil, 5-FU. Tell your doctor or health care professional if you have diarrhea or mouth sores that do not get better or that get worse. What side effects may I notice from receiving this medication? Side effects that you should report to your doctor or health care professional as soon as possible: allergic reactions like skin rash, itching or hives, swelling of the face, lips, or tongue breathing problems fever, infection mouth sores unusual bleeding or bruising unusually weak or tired Side effects that usually do not require medical attention (report to your doctor or health care professional if they continue or are bothersome): constipation or diarrhea loss of appetite nausea, vomiting This list may not describe all possible side effects. Call your doctor for medical advice  about side effects. You may report side effects to FDA at 1-800-FDA-1088. Where should I keep my medication? This drug is given in a hospital or clinic and will not be stored at home. NOTE: This sheet is a summary. It may not cover all possible information. If you have questions about this medicine, talk to your doctor, pharmacist, or health care provider.  2022 Elsevier/Gold Standard (2008-05-07 00:00:00)  Fluorouracil, 5-FU injection What is this medication? FLUOROURACIL, 5-FU (flure oh YOOR a sil) is a chemotherapy drug. It slows the growth of cancer cells. This medicine is used to treat many types of cancer like breast cancer, colon or rectal cancer, pancreatic cancer, and stomach cancer. This medicine may be used for other purposes; ask your health care provider or pharmacist if you have questions. COMMON BRAND NAME(S): Adrucil What should I tell my care team before I take this medication? They need to know if you have any of these conditions: blood disorders dihydropyrimidine dehydrogenase (DPD) deficiency infection (especially a virus infection such as chickenpox, cold  sores, or herpes) kidney disease liver disease malnourished, poor nutrition recent or ongoing radiation therapy an unusual or allergic reaction to fluorouracil, other chemotherapy, other medicines, foods, dyes, or preservatives pregnant or trying to get pregnant breast-feeding How should I use this medication? This drug is given as an infusion or injection into a vein. It is administered in a hospital or clinic by a specially trained health care professional. Talk to your pediatrician regarding the use of this medicine in children. Special care may be needed. Overdosage: If you think you have taken too much of this medicine contact a poison control center or emergency room at once. NOTE: This medicine is only for you. Do not share this medicine with others. What if I miss a dose? It is important not to miss your dose. Call your doctor or health care professional if you are unable to keep an appointment. What may interact with this medication? Do not take this medicine with any of the following medications: live virus vaccines This medicine may also interact with the following medications: medicines that treat or prevent blood clots like warfarin, enoxaparin, and dalteparin This list may not describe all possible interactions. Give your health care provider a list of all the medicines, herbs, non-prescription drugs, or dietary supplements you use. Also tell them if you smoke, drink alcohol, or use illegal drugs. Some items may interact with your medicine. What should I watch for while using this medication? Visit your doctor for checks on your progress. This drug may make you feel generally unwell. This is not uncommon, as chemotherapy can affect healthy cells as well as cancer cells. Report any side effects. Continue your course of treatment even though you feel ill unless your doctor tells you to stop. In some cases, you may be given additional medicines to help with side effects. Follow all  directions for their use. Call your doctor or health care professional for advice if you get a fever, chills or sore throat, or other symptoms of a cold or flu. Do not treat yourself. This drug decreases your body's ability to fight infections. Try to avoid being around people who are sick. This medicine may increase your risk to bruise or bleed. Call your doctor or health care professional if you notice any unusual bleeding. Be careful brushing and flossing your teeth or using a toothpick because you may get an infection or bleed more easily. If you have any dental work done, tell  your dentist you are receiving this medicine. Avoid taking products that contain aspirin, acetaminophen, ibuprofen, naproxen, or ketoprofen unless instructed by your doctor. These medicines may hide a fever. Do not become pregnant while taking this medicine. Women should inform their doctor if they wish to become pregnant or think they might be pregnant. There is a potential for serious side effects to an unborn child. Talk to your health care professional or pharmacist for more information. Do not breast-feed an infant while taking this medicine. Men should inform their doctor if they wish to father a child. This medicine may lower sperm counts. Do not treat diarrhea with over the counter products. Contact your doctor if you have diarrhea that lasts more than 2 days or if it is severe and watery. This medicine can make you more sensitive to the sun. Keep out of the sun. If you cannot avoid being in the sun, wear protective clothing and use sunscreen. Do not use sun lamps or tanning beds/booths. What side effects may I notice from receiving this medication? Side effects that you should report to your doctor or health care professional as soon as possible: allergic reactions like skin rash, itching or hives, swelling of the face, lips, or tongue low blood counts - this medicine may decrease the number of white blood cells, red  blood cells and platelets. You may be at increased risk for infections and bleeding. signs of infection - fever or chills, cough, sore throat, pain or difficulty passing urine signs of decreased platelets or bleeding - bruising, pinpoint red spots on the skin, black, tarry stools, blood in the urine signs of decreased red blood cells - unusually weak or tired, fainting spells, lightheadedness breathing problems changes in vision chest pain mouth sores nausea and vomiting pain, swelling, redness at site where injected pain, tingling, numbness in the hands or feet redness, swelling, or sores on hands or feet stomach pain unusual bleeding Side effects that usually do not require medical attention (report to your doctor or health care professional if they continue or are bothersome): changes in finger or toe nails diarrhea dry or itchy skin hair loss headache loss of appetite sensitivity of eyes to the light stomach upset unusually teary eyes This list may not describe all possible side effects. Call your doctor for medical advice about side effects. You may report side effects to FDA at 1-800-FDA-1088. Where should I keep my medication? This drug is given in a hospital or clinic and will not be stored at home. NOTE: This sheet is a summary. It may not cover all possible information. If you have questions about this medicine, talk to your doctor, pharmacist, or health care provider.  2022 Elsevier/Gold Standard (2021-07-19 00:00:00)

## 2021-10-19 NOTE — Telephone Encounter (Signed)
Left message to please call back. °

## 2021-10-19 NOTE — Progress Notes (Addendum)
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  Robert continues to have postprandial abdominal pain.  Robert is not taking antiacid therapy.  Robert takes tramadol intermittently. Robert underwent an upper endoscopy on 10/13/2021.  Atrophic mucosa was found in the stomach.  No mass lesion was found in the stomach or duodenum.  A biopsy from the stomach confirmed H. pylori gastritis.  Robert underwent Port-A-Cath placement 10/14/2021. Objective:  Vital signs in last 24 hours:  Blood pressure 127/88, pulse 94, temperature 98.1 F (36.7 C), temperature source Oral, resp. rate 20, height _0  (1.778 m), weight 194 lb (88 kg), SpO2 100 %.    Resp: Lungs clear bilaterally Cardio: Regular rate and rhythm GI: Tender in the right subcostal region, no mass Vascular: No leg edema   Portacath/PICC-without erythema  Lab Results:  Lab Results  Component Value Date   WBC 5.0 10/17/2021   HGB 13.4 10/17/2021   HCT 40.7 10/17/2021   MCV 84.4 10/17/2021   PLT 190 10/17/2021   NEUTROABS 3.1 10/17/2021    CMP  Lab Results  Component Value Date   NA 134 (L) 10/17/2021   K 4.2 10/17/2021   CL 99 10/17/2021   CO2 26 10/17/2021   GLUCOSE 259 (H) 10/17/2021   BUN 19 10/17/2021   CREATININE 1.08 10/17/2021   CALCIUM 9.9 10/17/2021   PROT 7.1 10/17/2021   ALBUMIN 4.0 10/17/2021   AST 41 10/17/2021   ALT 34 10/17/2021   ALKPHOS 97 10/17/2021   BILITOT 0.9 10/17/2021   GFRNONAA >60 10/17/2021   GFRAA 95 06/15/2008    Lab Results  Component Value Date   CEA 33.79 (H) 09/29/2021   EVO350 81 (H) 09/29/2021    Medications: I have reviewed the patient's current medications.   Assessment/Plan: Poorly differentiated adenocarcinoma involving the liver Abdominal ultrasound 08/05/2021-indeterminate 106m solid mass in the right hepatic lobe.   MRI of the liver 09/06/2021-multifocal rim-enhancing lesions  throughout both lobes of the liver.  Index lesion within the lateral dome of right hepatic lobe measures 1.1 cm, segment 4A lesion measures 1.6 x 1.4 cm, segment 2 lesion measures 0.8 x 0.7 cm, posteromedial margin of the right hepatic lobe subcapsular lesion measures 2.5 x 2.0 cm 09/16/2021 CT angio neck-9 mm right upper lobe nodule Biopsy of a right liver mass on 09/23/2021.  Pathology shows poorly differentiated adenocarcinoma positive for cytokeratin 7, CDX2 and cytokeratin 20 and negative for TTF-1, PSA and prostein.  Differential diagnoses include pancreatobiliary and less likely upper GI. HER2 positive by FISH PD-L1 combined positive score 0% MSS equivocal, tumor mutation burden 0, ERBB2 amplification equivocal 09/29/2021 CA 19-9 81, CEA 33 PET scan 10/10/2021-solitary 10 mm right upper lobe pulmonary nodule with minimal FDG uptake.  7.5 mm left internal mammary lymph node hypermetabolic with SUV max 40.93  Numerous hepatic lesions.  Periportal lymphadenopathy SUV max 8.18.  2 adjacent lesions noted in the mesentery in the right mid abdomen with somewhat irregular margins, SUV 8.09.  No hypermetabolic colonic lesion identified to suggest a primary colon cancer.  No pancreatic lesion.  No gastric lesion.  No retroperitoneal lymphadenopathy. Upper endoscopy 10/13/2021-no mass, H. pylori gastritis on gastric biopsy Cycle 1 FOLFOX 10/19/2021 Hospital admission 09/16/2021 - 09/19/2021 with gait disturbance-multifocal embolic stroke on MRI 181/06/2992 echo with interatrial shunt, transcranial Doppler with bubble study indicative of a medium size right to left shunt, lower extremity Doppler studies negative  for DVT Hospital admission 09/20/2021 - 09/22/2021 with chest pain-CT coronary with no acute findings.   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  No apparent primary tumor site has  been found on upper endoscopy or staging PET scan.  Robert has not undergone a recent colonoscopy, but there was no evidence for a colon primary on the staging PET scan and the immunohistochemical profile is not typical of colon cancer.  The differential diagnosis includes cholangiocarcinoma and an occult gastrointestinal primary.  I discussed treatment options with [name].  A Foundation 1 NGS study does not reveal an actionable mutation.  We are waiting on results from Her-2 and mismatch repair protein testing on the liver biopsy tissue.  I recommend treatment with FOLFOX.  We discussed the rationale for FOLFOX as this is a chemotherapy regimen with broad activity against gastrointestinal malignancies.  We also discussed gemcitabine/cisplatin +/- Durvalumab therapy.  I favor FOLFOX based on the toxicity profile and lack of a clear primary tumor site.  Robert agrees to proceed with FOLFOX.  We will consider adding HER2 directed therapy if the HER2 returns amplified on the [place] study.  [name] will complete cycle 1 FOLFOX today.  Robert will return for an office visit and chemotherapy in 2 weeks.   10/19/2021  10:31 AM

## 2021-10-19 NOTE — Progress Notes (Signed)
PATIENT NAVIGATOR PROGRESS NOTE  Name: ALIJAH AKRAM Date: 10/19/2021 MRN: [id number]  DOB: 1940/11/28   Reason for visit:  First chemo treatment  Comments:  Met with Dr and Mrs Gibbard at first chemo treatment. Port site is tender. He is ready to start treatment.  Will continue to follow    Time spent counseling/coordinating care: 15-30 minutes

## 2021-10-19 NOTE — Progress Notes (Signed)
80 year old male diagnoses with Adenocarcinoma in the liver. Receiving 1st cycle of FOLFOX today.  PMH includes DM, HLD, HTN, Stroke, CKD.  Medications include Vitamin D, Glucophage XR, Zofran, Protonix, Compazine, Tramadol.  Labs include Na 134 and Glucose 259.  Height: 5'10". Weight: 194 pounds. UBW: 200 pounds. BMI: 27.84  Patient reports he is consuming a normal diet. He is eating regular meals and drinking 1 Glucerna Shake daily. Usually has Bran flakes, banana, walnuts and 2% milk at breakfast. Denies nutrition impact symptoms.  Nutrition Diagnosis: Food and Nutrition Related Knowledge Deficit related to adenocarcinoma as evidenced by no prior need for nutrition related information.  Intervention: Educated to consume small amounts of food frequently throughout the day with adequate calories and protein. Continue Glucerna once daily. Educated goal of BS is 180 or below. Monitor regularly. Provided education on cold sensitivity, high protein foods and dehydration. Gave patient nutrition fact sheets and contact information.  Monitoring, Evaluation, Goals: Patient will tolerate adequate calories and protein for wt maintenance.  Next Visit:  To be scheduled as needed.

## 2021-10-20 ENCOUNTER — Encounter (HOSPITAL_COMMUNITY): Payer: Self-pay | Admitting: Oncology

## 2021-10-20 ENCOUNTER — Encounter: Payer: Self-pay | Admitting: *Deleted

## 2021-10-20 ENCOUNTER — Telehealth: Payer: Self-pay | Admitting: *Deleted

## 2021-10-20 NOTE — Progress Notes (Signed)
PATIENT NAVIGATOR PROGRESS NOTE  Name: Robert Warner Date: 10/20/2021 MRN: [id number]  DOB: [birth year]   Reason for visit:  F/U call after first chemo  Comments:  Spoke with patient after first FOLFOX, tolerating well, eating and drinking with some slight nausea but improved with antinausea medication. Reported that his blood glucose was over 200 this am, and informed patient this was likely due to Dexamethasone premed. We reviewed his meds and how to take. Will create a med list for him and give him tomorrow when he is here for pump d/c. We discussed Her2 findings and that Dr Benay Spice will address this with him next visit.  Encouraged him to call with any issues or questions    Time spent counseling/coordinating care: 30-45 minutes

## 2021-10-20 NOTE — Progress Notes (Signed)
Referral for 2nd opinion made to [facility] oncologist Dr Karmen Stabs. Appointment for 11/08/21 at 11:00, called and spoke with Mrs Flammer and relayed appt date and time.

## 2021-10-20 NOTE — Telephone Encounter (Signed)
See EGD path result note

## 2021-10-20 NOTE — Progress Notes (Signed)
MMR testing ordered on Accession# [id number]

## 2021-10-21 ENCOUNTER — Encounter: Payer: Self-pay | Admitting: Oncology

## 2021-10-21 ENCOUNTER — Inpatient Hospital Stay: Payer: Medicare HMO

## 2021-10-21 ENCOUNTER — Other Ambulatory Visit: Payer: Self-pay

## 2021-10-21 ENCOUNTER — Ambulatory Visit: Payer: Medicare HMO | Admitting: Physical Therapy

## 2021-10-21 VITALS — BP 141/94 | HR 94 | Temp 98.9°F | Resp 20

## 2021-10-21 DIAGNOSIS — R293 Abnormal posture: Secondary | ICD-10-CM

## 2021-10-21 DIAGNOSIS — R2681 Unsteadiness on feet: Secondary | ICD-10-CM | POA: Diagnosis not present

## 2021-10-21 DIAGNOSIS — Z5111 Encounter for antineoplastic chemotherapy: Secondary | ICD-10-CM | POA: Diagnosis not present

## 2021-10-21 DIAGNOSIS — C801 Malignant (primary) neoplasm, unspecified: Secondary | ICD-10-CM | POA: Diagnosis not present

## 2021-10-21 DIAGNOSIS — N189 Chronic kidney disease, unspecified: Secondary | ICD-10-CM | POA: Diagnosis not present

## 2021-10-21 DIAGNOSIS — R2689 Other abnormalities of gait and mobility: Secondary | ICD-10-CM

## 2021-10-21 DIAGNOSIS — I129 Hypertensive chronic kidney disease with stage 1 through stage 4 chronic kidney disease, or unspecified chronic kidney disease: Secondary | ICD-10-CM | POA: Diagnosis not present

## 2021-10-21 DIAGNOSIS — C787 Secondary malignant neoplasm of liver and intrahepatic bile duct: Secondary | ICD-10-CM | POA: Diagnosis not present

## 2021-10-21 DIAGNOSIS — M6281 Muscle weakness (generalized): Secondary | ICD-10-CM

## 2021-10-21 DIAGNOSIS — Z452 Encounter for adjustment and management of vascular access device: Secondary | ICD-10-CM | POA: Diagnosis not present

## 2021-10-21 DIAGNOSIS — E1122 Type 2 diabetes mellitus with diabetic chronic kidney disease: Secondary | ICD-10-CM | POA: Diagnosis not present

## 2021-10-21 MED ORDER — SODIUM CHLORIDE 0.9% FLUSH
10.0000 mL | INTRAVENOUS | Status: DC | PRN
  Administered 2021-10-21: 10 mL

## 2021-10-21 MED ORDER — HEPARIN SOD (PORK) LOCK FLUSH 100 UNIT/ML IV SOLN
500.0000 [IU] | Freq: Once | INTRAVENOUS | Status: AC | PRN
  Administered 2021-10-21: 500 [IU]

## 2021-10-21 NOTE — Therapy (Signed)
[facility] [address] [place], [place], [address] Phone: [phone]   Fax:  [phone]  Physical Therapy Treatment  Patient Details  Name: Robert Warner MRN: [id number] Date of Birth: 1941/10/05 Referring Provider (PT): Antonieta Pert, MD   Encounter Date: 10/21/2021   PT End of Session - 10/21/21 0941     Visit Number 5    Number of Visits 10    Date for PT Re-Evaluation 11/11/21    Authorization Type [employer]    Authorization Time Period 9 visits 11/18-12/30    Authorization - Visit Number 4    Authorization - Number of Visits 9    PT Start Time 0272    PT Stop Time 0936    PT Time Calculation (min) 45 min    Equipment Utilized During Treatment Gait belt    Activity Tolerance Patient tolerated treatment well    Behavior During Therapy [facility] for tasks assessed/performed             Past Medical History:  Diagnosis Date   Arthritis    Cancer ([place])    Cataract    Diabetes mellitus    Hyperlipidemia    Hypertension    Stroke [facility])     Past Surgical History:  Procedure Laterality Date   APPENDECTOMY  1968   CATARACT EXTRACTION Bilateral 07/2000   states 2 weeks ago (first of sept) OD due in Dec    COLONOSCOPY  2004,2009   Negative, Dr. Sharlett Iles    IR IMAGING GUIDED PORT INSERTION  10/14/2021   KIDNEY STONE SURGERY  10/2018   PROSTATE BIOPSY  2006   Dr.Sigmund Tannebaum    There were no vitals filed for this visit.   Subjective Assessment - 10/21/21 0859     Subjective Has been up and down since the chemo- nausea, indigestion. Port sights are healed up and nonpainful.    Pertinent History DMT2, recent CVA, HTN, HLD    Diagnostic tests 09/16/21 brain MRI: Multiple small foci of restricted diffusion throughout the bilateral cerebral and cerebellar hemispheres, as well as the right thalamus,  consistent with acute infarcts. Additional subacute infarcts in the  vermis and left aspect of the splenium of the  corpus callosum. Given  different vascular territories, an embolic etiology is favored.    Patient Stated Goals improve balance    Currently in Pain? No/denies                               [facility] Adult PT Treatment/Exercise - 10/21/21 0001       Neuro Re-ed    Neuro Re-ed Details  walking + throwing/catching ball 4x 102ft with cues for increased foot clearance      Knee/Hip Exercises: Stretches   Gastroc Stretch Right;Left;1 rep;30 seconds    Gastroc Stretch Limitations each LE      Knee/Hip Exercises: Aerobic   Nustep L5 x 6 min (LEs only)      Knee/Hip Exercises: Standing   Heel Raises Both;1 set;20 reps    Heel Raises Limitations B heel/toe raise without UE support   cues to increase ROM                Balance Exercises - 10/21/21 0001       Balance Exercises: Standing   Tandem Gait Forward;Retro;4 reps;Limitations    Tandem Gait Limitations cues to touch heel to toe- CGA   improved  stability   Other Standing Exercises grapevine in II bars with heavy cueing, demonstration, and CGA for safety    Other Standing Exercises Comments alt toe tap on cone 2x20 on foam with CGA and light B UE support                PT Education - 10/21/21 0941     Education Details update to HEP- Access Code [email or web address]    Person(s) Educated Patient    Methods Explanation;Demonstration;Tactile cues;Verbal cues;Handout    Comprehension Verbalized understanding              PT Short Term Goals - 10/14/21 1227       PT SHORT TERM GOAL #1   Title Patient to be independent with initial HEP.    Time 3    Period Weeks    Status Achieved    Target Date 10/21/21               PT Long Term Goals - 10/03/21 1239       PT LONG TERM GOAL #1   Title Patient to be independent with advanced HEP.    Time 6    Period Weeks    Status On-going      PT LONG TERM GOAL #2   Title Patient to complete TUG in <14 sec with LRAD in order to decrease risk of  falls.    Time 6    Period Weeks    Status Achieved      PT LONG TERM GOAL #3   Title Patient to score at least 23/30 on FGA in order to decrease risk of falls.    Time 6    Period Weeks    Status On-going      PT LONG TERM GOAL #4   Title Patient to demonstrate B LE strength >/=4+/5.    Time 6    Period Weeks    Status On-going      PT LONG TERM GOAL #5   Title Patient will ambulate over outdoor surfaces with LRAD while performing head turns to scan environment with good stability in order to indicate safe community mobility.    Time 6    Period Weeks    Status On-going                   Plan - 10/21/21 0942     Clinical Impression Statement Patient arrived to session with report of feeling "up and down" since receiving chemo. Notes no issues with his port. Patient appeared slightly more fatigue today and required sitting rest/water break after warm up, however tolerated remainder of session well. Worked on dynamic balance activities with CGA and intermittent light UE support d/t imbalance. Patient did demonstrate improved stability with tandem walking, however still with instability with SLS activities and gait with dual task. Updated HEP for increased challenge. Patient reported understanding and without complaints at end of session.    Comorbidities DMT2, recent CVA, HTN, HLD    PT Treatment/Interventions ADLs/Self Care Home Management;Canalith Repostioning;Cryotherapy;Electrical Stimulation;DME Instruction;Ultrasound;Moist Heat;Gait training;Stair training;Functional mobility training;Therapeutic activities;Therapeutic exercise;Balance training;Neuromuscular re-education;Manual techniques;Patient/family education;Passive range of motion;Dry needling;Energy conservation;Vestibular;Vasopneumatic Device;Taping    PT Next Visit Plan progress L LE strengthening and higher level balance; gait training with/without SPC outside    Consulted and Agree with Plan of Care Patient              Patient will benefit from skilled therapeutic intervention in order to improve the following deficits  and impairments:  Abnormal gait, Decreased coordination, Decreased range of motion, Decreased safety awareness, Decreased activity tolerance, Decreased balance, Impaired flexibility, Improper body mechanics, Postural dysfunction, Decreased strength  Visit Diagnosis: Unsteadiness on feet  Muscle weakness (generalized)  Other abnormalities of gait and mobility  Abnormal posture     Problem List Patient Active Problem List   Diagnosis Date Noted   Cancer with unknown primary site ([place]) 10/11/2021   Goals of care, counseling/discussion 10/11/2021   Chest pain 09/21/2021   Recent cerebrovascular accident (CVA) 09/21/2021   Interatrial cardiac shunt 09/21/2021   CKD (chronic kidney disease) stage 3, GFR 30-59 ml/min ([place]) 80/88/1103   Acute embolic stroke ([place]) 15/94/5859   Mixed diabetic hyperlipidemia associated with type 2 diabetes mellitus ([place]) 09/16/2021   Liver masses 09/16/2021   Essential hypertension 03/12/2012   Type 2 diabetes mellitus without complication, without long-term current use of insulin ([place]) 06/15/2008   BPH (benign prostatic hyperplasia) 06/15/2008     Janene Harvey, PT, DPT 10/21/21 9:46 AM   [facility] [address] [place], [place], [address] Phone: [phone]   Fax:  [phone]  Name: DOROTEO NICKOLSON MRN: [id number] Date of Birth: 1940-11-19

## 2021-10-24 ENCOUNTER — Ambulatory Visit: Payer: Medicare HMO | Admitting: Physical Therapy

## 2021-10-26 DIAGNOSIS — C787 Secondary malignant neoplasm of liver and intrahepatic bile duct: Secondary | ICD-10-CM | POA: Diagnosis not present

## 2021-10-26 DIAGNOSIS — C801 Malignant (primary) neoplasm, unspecified: Secondary | ICD-10-CM | POA: Diagnosis not present

## 2021-10-28 ENCOUNTER — Other Ambulatory Visit: Payer: Self-pay

## 2021-10-28 ENCOUNTER — Encounter: Payer: Self-pay | Admitting: *Deleted

## 2021-10-28 ENCOUNTER — Encounter: Payer: Self-pay | Admitting: Physical Therapy

## 2021-10-28 ENCOUNTER — Ambulatory Visit: Payer: Medicare HMO | Admitting: Physical Therapy

## 2021-10-28 DIAGNOSIS — R2681 Unsteadiness on feet: Secondary | ICD-10-CM

## 2021-10-28 DIAGNOSIS — R2689 Other abnormalities of gait and mobility: Secondary | ICD-10-CM

## 2021-10-28 DIAGNOSIS — C801 Malignant (primary) neoplasm, unspecified: Secondary | ICD-10-CM | POA: Diagnosis not present

## 2021-10-28 DIAGNOSIS — I129 Hypertensive chronic kidney disease with stage 1 through stage 4 chronic kidney disease, or unspecified chronic kidney disease: Secondary | ICD-10-CM | POA: Diagnosis not present

## 2021-10-28 DIAGNOSIS — N189 Chronic kidney disease, unspecified: Secondary | ICD-10-CM | POA: Diagnosis not present

## 2021-10-28 DIAGNOSIS — M6281 Muscle weakness (generalized): Secondary | ICD-10-CM

## 2021-10-28 DIAGNOSIS — R293 Abnormal posture: Secondary | ICD-10-CM

## 2021-10-28 DIAGNOSIS — Z452 Encounter for adjustment and management of vascular access device: Secondary | ICD-10-CM | POA: Diagnosis not present

## 2021-10-28 DIAGNOSIS — Z5111 Encounter for antineoplastic chemotherapy: Secondary | ICD-10-CM | POA: Diagnosis not present

## 2021-10-28 DIAGNOSIS — C787 Secondary malignant neoplasm of liver and intrahepatic bile duct: Secondary | ICD-10-CM | POA: Diagnosis not present

## 2021-10-28 DIAGNOSIS — E1122 Type 2 diabetes mellitus with diabetic chronic kidney disease: Secondary | ICD-10-CM | POA: Diagnosis not present

## 2021-10-28 NOTE — Therapy (Signed)
[facility] [address] [place], [place], [address] Phone: [phone]   Fax:  [phone]  Physical Therapy Treatment  Patient Details  Name: Robert Warner MRN: [id number] Date of Birth: November 30, 1940 Referring Provider (PT): Antonieta Pert, MD   Encounter Date: 10/28/2021   PT End of Session - 10/28/21 1057     Visit Number 6    Number of Visits 10    Date for PT Re-Evaluation 11/11/21    Authorization Type [employer]    Authorization Time Period 9 visits 11/18-12/30    Authorization - Visit Number 5    Authorization - Number of Visits 9    PT Start Time 2229    PT Stop Time 1057    PT Time Calculation (min) 42 min    Equipment Utilized During Treatment Gait belt    Activity Tolerance Patient tolerated treatment well    Behavior During Therapy WFL for tasks assessed/performed             Past Medical History:  Diagnosis Date   Arthritis    Cancer ([place])    Cataract    Diabetes mellitus    Hyperlipidemia    Hypertension    Stroke [facility])     Past Surgical History:  Procedure Laterality Date   APPENDECTOMY  1968   CATARACT EXTRACTION Bilateral 07/2000   states 2 weeks ago (first of sept) OD due in Dec    COLONOSCOPY  2004,2009   Negative, Dr. Sharlett Iles    IR IMAGING GUIDED PORT INSERTION  10/14/2021   KIDNEY STONE SURGERY  10/2018   PROSTATE BIOPSY  2006   Dr.Sigmund Tannebaum    There were no vitals filed for this visit.   Subjective Assessment - 10/28/21 1017     Subjective Feeling nauseous from the chemo and still having abdominal pain. Denies falls but still not steady.    Pertinent History DMT2, recent CVA, HTN, HLD    Diagnostic tests 09/16/21 brain MRI: Multiple small foci of restricted diffusion throughout the bilateral cerebral and cerebellar hemispheres, as well as the right thalamus,  consistent with acute infarcts. Additional subacute infarcts in the  vermis and left aspect of the splenium of the  corpus callosum. Given  different vascular territories, an embolic etiology is favored.    Patient Stated Goals improve balance    Currently in Pain? No/denies                               [facility] Adult PT Treatment/Exercise - 10/28/21 0001       Ambulation/Gait   Ambulation Distance (Feet) 172 Feet    Assistive device None    Gait Pattern Step-through pattern;Decreased stance time - left;Decreased stance time - right;Poor foot clearance - left;Poor foot clearance - right;Trunk flexed    Ambulation Surface Level;Indoor    Gait velocity slightly decreased    Gait Comments gait training without AD- patient required CGA d/t intermittent path deviation and scissoring      Knee/Hip Exercises: Stretches   Gastroc Stretch Right;Left;1 rep;30 seconds    Gastroc Stretch Limitations each LE      Knee/Hip Exercises: Aerobic   Nustep L5 x 6 min (UEs/LEs)      Knee/Hip Exercises: Standing   Hip Abduction Stengthening;Right;Left;1 set;Knee straight    Abduction Limitations red TB; cues to avoid trunk lean    Hip Extension Stengthening;Right;Left;1 set;10 reps;Knee straight  Extension Limitations red TB; cues to avoid trunk lean    Step Down Right;Left;1 set;10 reps;Hand Hold: 1;Step Height: 4"    Step Down Limitations anterior step down with heel touch    Functional Squat 2 sets;10 reps    Functional Squat Limitations squat to chair + 2 airex pads   cues for set up and shifting over toes to avoid posterior LOB                Balance Exercises - 10/28/21 0001       Balance Exercises: Standing   Other Standing Exercises Comments R/L forward/back step over 1/2 foam roll touching heel down on floor 10x each in II bars with CGA-min A                  PT Short Term Goals - 10/14/21 1227       PT SHORT TERM GOAL #1   Title Patient to be independent with initial HEP.    Time 3    Period Weeks    Status Achieved    Target Date 10/21/21                PT Long Term Goals - 10/03/21 1239       PT LONG TERM GOAL #1   Title Patient to be independent with advanced HEP.    Time 6    Period Weeks    Status On-going      PT LONG TERM GOAL #2   Title Patient to complete TUG in <14 sec with LRAD in order to decrease risk of falls.    Time 6    Period Weeks    Status Achieved      PT LONG TERM GOAL #3   Title Patient to score at least 23/30 on FGA in order to decrease risk of falls.    Time 6    Period Weeks    Status On-going      PT LONG TERM GOAL #4   Title Patient to demonstrate B LE strength >/=4+/5.    Time 6    Period Weeks    Status On-going      PT LONG TERM GOAL #5   Title Patient will ambulate over outdoor surfaces with LRAD while performing head turns to scan environment with good stability in order to indicate safe community mobility.    Time 6    Period Weeks    Status On-going                   Plan - 10/28/21 1057     Clinical Impression Statement Patient reports feeling nauseous from the chemo and still having abdominal pain. Monitored symptoms throughout session. Worked on dynamic balance exercises with focus on stepping strategy and stair navigation. CGA-min A was provided with balance activities d/t imbalance. Limited eccentric control evident with step downs, requiring cues to correct. Hip strengthening required cues to maintain chest up and knee extended throughout, with good carryover and effort. Patient performed gait training without AD- patient required CGA d/t intermittent path deviation and scissoring; encouraged patient to continue using SPC as a result. Patient tolerated session well and without complaints at end of session. Progressing well towards goals.    Comorbidities DMT2, recent CVA, HTN, HLD    PT Treatment/Interventions ADLs/Self Care Home Management;Canalith Repostioning;Cryotherapy;Electrical Stimulation;DME Instruction;Ultrasound;Moist Heat;Gait training;Stair  training;Functional mobility training;Therapeutic activities;Therapeutic exercise;Balance training;Neuromuscular re-education;Manual techniques;Patient/family education;Passive range of motion;Dry needling;Energy conservation;Vestibular;Vasopneumatic Device;Taping    PT Next  Visit Plan progress L LE strengthening and higher level balance; gait training with/without SPC outside    Consulted and Agree with Plan of Care Patient             Patient will benefit from skilled therapeutic intervention in order to improve the following deficits and impairments:  Abnormal gait, Decreased coordination, Decreased range of motion, Decreased safety awareness, Decreased activity tolerance, Decreased balance, Impaired flexibility, Improper body mechanics, Postural dysfunction, Decreased strength  Visit Diagnosis: Unsteadiness on feet  Muscle weakness (generalized)  Other abnormalities of gait and mobility  Abnormal posture     Problem List Patient Active Problem List   Diagnosis Date Noted   Cancer with unknown primary site ([place]) 10/11/2021   Goals of care, counseling/discussion 10/11/2021   Chest pain 09/21/2021   Recent cerebrovascular accident (CVA) 09/21/2021   Interatrial cardiac shunt 09/21/2021   CKD (chronic kidney disease) stage 3, GFR 30-59 ml/min ([place]) 33/43/5686   Acute embolic stroke ([place]) 16/83/7290   Mixed diabetic hyperlipidemia associated with type 2 diabetes mellitus ([place]) 09/16/2021   Liver masses 09/16/2021   Essential hypertension 03/12/2012   Type 2 diabetes mellitus without complication, without long-term current use of insulin ([place]) 06/15/2008   BPH (benign prostatic hyperplasia) 06/15/2008     Janene Harvey, PT, DPT 10/28/21 11:00 AM   [facility] [address], [place], [place], [address] Phone: [phone]   Fax:  [phone]  Name: QUINTO TIPPY MRN: [id number] Date of Birth: [birth year]

## 2021-10-30 ENCOUNTER — Other Ambulatory Visit: Payer: Self-pay | Admitting: Oncology

## 2021-11-01 ENCOUNTER — Encounter: Payer: Self-pay | Admitting: *Deleted

## 2021-11-01 LAB — SURGICAL PATHOLOGY

## 2021-11-01 NOTE — Progress Notes (Signed)
Received notification from Dr Saralyn Pilar in Pathology dept that there is not enough tissue for MMR or MSI on Liver biopsy dated 09/23/21. Dr Benay Spice informed

## 2021-11-02 ENCOUNTER — Encounter: Payer: Self-pay | Admitting: *Deleted

## 2021-11-02 ENCOUNTER — Telehealth: Payer: Self-pay

## 2021-11-02 ENCOUNTER — Inpatient Hospital Stay: Payer: Medicare HMO

## 2021-11-02 ENCOUNTER — Other Ambulatory Visit: Payer: Self-pay

## 2021-11-02 ENCOUNTER — Inpatient Hospital Stay (HOSPITAL_BASED_OUTPATIENT_CLINIC_OR_DEPARTMENT_OTHER): Payer: Medicare HMO | Admitting: Oncology

## 2021-11-02 VITALS — BP 173/95 | HR 78 | Temp 98.3°F | Resp 20

## 2021-11-02 VITALS — BP 150/99 | HR 66 | Temp 97.8°F | Resp 20 | Ht 70.0 in | Wt 192.6 lb

## 2021-11-02 DIAGNOSIS — Z452 Encounter for adjustment and management of vascular access device: Secondary | ICD-10-CM | POA: Diagnosis not present

## 2021-11-02 DIAGNOSIS — Z5111 Encounter for antineoplastic chemotherapy: Secondary | ICD-10-CM | POA: Diagnosis not present

## 2021-11-02 DIAGNOSIS — I129 Hypertensive chronic kidney disease with stage 1 through stage 4 chronic kidney disease, or unspecified chronic kidney disease: Secondary | ICD-10-CM | POA: Diagnosis not present

## 2021-11-02 DIAGNOSIS — N189 Chronic kidney disease, unspecified: Secondary | ICD-10-CM | POA: Diagnosis not present

## 2021-11-02 DIAGNOSIS — C801 Malignant (primary) neoplasm, unspecified: Secondary | ICD-10-CM

## 2021-11-02 DIAGNOSIS — C229 Malignant neoplasm of liver, not specified as primary or secondary: Secondary | ICD-10-CM | POA: Diagnosis not present

## 2021-11-02 DIAGNOSIS — R2681 Unsteadiness on feet: Secondary | ICD-10-CM | POA: Diagnosis not present

## 2021-11-02 DIAGNOSIS — M6281 Muscle weakness (generalized): Secondary | ICD-10-CM | POA: Diagnosis not present

## 2021-11-02 DIAGNOSIS — E1122 Type 2 diabetes mellitus with diabetic chronic kidney disease: Secondary | ICD-10-CM | POA: Diagnosis not present

## 2021-11-02 DIAGNOSIS — C787 Secondary malignant neoplasm of liver and intrahepatic bile duct: Secondary | ICD-10-CM | POA: Diagnosis not present

## 2021-11-02 LAB — CMP (CANCER CENTER ONLY)
ALT: 30 U/L (ref 0–44)
AST: 40 U/L (ref 15–41)
Albumin: 3.5 g/dL (ref 3.5–5.0)
Alkaline Phosphatase: 85 U/L (ref 38–126)
Anion gap: 5 (ref 5–15)
BUN: 15 mg/dL (ref 8–23)
CO2: 28 mmol/L (ref 22–32)
Calcium: 8.8 mg/dL — ABNORMAL LOW (ref 8.9–10.3)
Chloride: 97 mmol/L — ABNORMAL LOW (ref 98–111)
Creatinine: 1.13 mg/dL (ref 0.61–1.24)
GFR, Estimated: >60 mL/min (ref >60)
Glucose, Bld: 344 mg/dL — ABNORMAL HIGH (ref 70–99)
Potassium: 4.5 mmol/L (ref 3.5–5.1)
Sodium: 130 mmol/L — ABNORMAL LOW (ref 135–145)
Total Bilirubin: 0.7 mg/dL (ref 0.3–1.2)
Total Protein: 6.5 g/dL (ref 6.5–8.1)

## 2021-11-02 LAB — CBC WITH DIFFERENTIAL (CANCER CENTER ONLY)
Abs Immature Granulocytes: 0 10*3/uL (ref 0.00–0.07)
Basophils Absolute: 0 10*3/uL (ref 0.0–0.1)
Basophils Relative: 1 %
Eosinophils Absolute: 0.1 10*3/uL (ref 0.0–0.5)
Eosinophils Relative: 2 %
HCT: 36.5 % — ABNORMAL LOW (ref 39.0–52.0)
Hemoglobin: 12.2 g/dL — ABNORMAL LOW (ref 13.0–17.0)
Immature Granulocytes: 0 %
Lymphocytes Relative: 21 %
Lymphs Abs: 0.8 10*3/uL (ref 0.7–4.0)
MCH: 27.7 pg (ref 26.0–34.0)
MCHC: 33.4 g/dL (ref 30.0–36.0)
MCV: 83 fL (ref 80.0–100.0)
Monocytes Absolute: 0.4 10*3/uL (ref 0.1–1.0)
Monocytes Relative: 12 %
Neutro Abs: 2.4 10*3/uL (ref 1.7–7.7)
Neutrophils Relative %: 64 %
Platelet Count: 145 10*3/uL — ABNORMAL LOW (ref 150–400)
RBC: 4.4 MIL/uL (ref 4.22–5.81)
RDW: 13.4 % (ref 11.5–15.5)
WBC Count: 3.6 10*3/uL — ABNORMAL LOW (ref 4.0–10.5)
nRBC: 0 % (ref 0.0–0.2)

## 2021-11-02 MED ORDER — FLUOROURACIL CHEMO INJECTION 2.5 GM/50ML
400.0000 mg/m2 | Freq: Once | INTRAVENOUS | Status: AC
  Administered 2021-11-02: 13:00:00 850 mg via INTRAVENOUS
  Filled 2021-11-02: qty 17

## 2021-11-02 MED ORDER — PALONOSETRON HCL INJECTION 0.25 MG/5ML
0.2500 mg | Freq: Once | INTRAVENOUS | Status: AC
  Administered 2021-11-02: 10:00:00 0.25 mg via INTRAVENOUS
  Filled 2021-11-02: qty 5

## 2021-11-02 MED ORDER — SODIUM CHLORIDE 0.9 % IV SOLN
2000.0000 mg/m2 | INTRAVENOUS | Status: DC
  Administered 2021-11-02: 14:00:00 4150 mg via INTRAVENOUS
  Filled 2021-11-02: qty 83

## 2021-11-02 MED ORDER — SODIUM CHLORIDE 0.9 % IV SOLN
10.0000 mg | Freq: Once | INTRAVENOUS | Status: AC
  Administered 2021-11-02: 10:00:00 10 mg via INTRAVENOUS
  Filled 2021-11-02: qty 1

## 2021-11-02 MED ORDER — LEUCOVORIN CALCIUM INJECTION 350 MG
400.0000 mg/m2 | Freq: Once | INTRAVENOUS | Status: AC
  Administered 2021-11-02: 11:00:00 832 mg via INTRAVENOUS
  Filled 2021-11-02: qty 41.6

## 2021-11-02 MED ORDER — FLUCONAZOLE 100 MG PO TABS: 100.0000 mg | ORAL_TABLET | Freq: Every day | ORAL | 0 refills | Status: AC

## 2021-11-02 MED ORDER — SODIUM CHLORIDE 0.9 % IV SOLN
150.0000 mg | Freq: Once | INTRAVENOUS | Status: AC
  Administered 2021-11-02: 10:00:00 150 mg via INTRAVENOUS
  Filled 2021-11-02: qty 5

## 2021-11-02 MED ORDER — OXALIPLATIN CHEMO INJECTION 100 MG/20ML
65.0000 mg/m2 | Freq: Once | INTRAVENOUS | Status: AC
  Administered 2021-11-02: 11:00:00 135 mg via INTRAVENOUS
  Filled 2021-11-02: qty 20

## 2021-11-02 MED ORDER — TRAMADOL HCL 50 MG PO TABS: 50.0000 mg | ORAL_TABLET | Freq: Four times a day (QID) | ORAL | 0 refills | Status: DC | PRN

## 2021-11-02 MED ORDER — DEXTROSE 5 % IV SOLN: Freq: Once | INTRAVENOUS | Status: AC

## 2021-11-02 NOTE — Progress Notes (Signed)
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 We discussed upcoming appt with [facility] and called to clarify if it in person or virtual visit, awaiting call from [name] to clarify.  Discussed symptom management with them as well.     Time spent counseling/coordinating care: 45-60 minutes

## 2021-11-02 NOTE — Progress Notes (Signed)
Patient presents for treatment. RN assessment completed along with the following:  Labs/vitals reviewed - Yes, and within treatment parameters.   Weight within 10% of previous measurement - Yes Oncology Treatment Attestation completed for current therapy- Yes, on date 10/11/21 Informed consent completed and reflects current therapy/intent - Yes, on date 10/19/21             Provider progress note reviewed - Today's provider note is not yet available. I reviewed the most recent oncology provider progress note in chart dated 10/19/21. Treatment/Antibody/Supportive plan reviewed - dose reduction and emend added S&H and other orders reviewed - no Previous treatment date reviewed - Yes, and the appropriate amount of time has elapsed between treatments. Clinic Hand Off Received from - Lenox Ponds, LPN  Patient to proceed with treatment.

## 2021-11-02 NOTE — Progress Notes (Signed)
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  He completed cycle 1 FOLFOX 10/19/2021.  He had nausea for several days following chemotherapy.  No emesis.  He had diarrhea on day 4.  He reports 2 episodes of diarrhea.  This has resolved.  He continues to have postprandial abdominal pain.  Tramadol helps.  He has "hunger "pains at night.  He eats during the night, but continues to have postprandial pain.  He has persistent cold sensitivity.  No peripheral numbness.  Objective:  Vital signs in last 24 hours:  Blood pressure (!) 150/99, pulse 66, temperature 97.8 F (36.6 C), temperature source Oral, resp. rate 20, height _0  (1.778 m), weight 192 lb 9.6 oz (87.4 kg), SpO2 100 %.    HEENT: Thick white coat over the tongue, no buccal thrush or ulcers Resp: Lungs clear bilaterally Cardio: Regular rate and rhythm GI: No mass, no hepatosplenomegaly, mild tenderness in the right upper abdomen Vascular: No leg edema  Skin: Palms without erythema  Portacath/PICC-without erythema  Lab Results:  Lab Results  Component Value Date   WBC 3.6 (L) 11/02/2021   HGB 12.2 (L) 11/02/2021   HCT 36.5 (L) 11/02/2021   MCV 83.0 11/02/2021   PLT 145 (L) 11/02/2021   NEUTROABS 2.4 11/02/2021    CMP  Lab Results  Component Value Date   NA 130 (L) 11/02/2021   K 4.5 11/02/2021   CL 97 (L) 11/02/2021   CO2 28 11/02/2021   GLUCOSE 344 (H) 11/02/2021   BUN 15 11/02/2021   CREATININE 1.13 11/02/2021   CALCIUM 8.8 (L) 11/02/2021   PROT 6.5 11/02/2021   ALBUMIN 3.5 11/02/2021   AST 40 11/02/2021   ALT 30 11/02/2021   ALKPHOS 85 11/02/2021   BILITOT 0.7 11/02/2021   GFRNONAA >60 11/02/2021   GFRAA 95 06/15/2008    Lab Results  Component Value Date   CEA 33.79 (H) 09/29/2021   WVP710 81 (H) 09/29/2021    Medications: I have reviewed the patient's current medications.   Assessment/Plan:  Poorly  differentiated adenocarcinoma involving the liver Abdominal ultrasound 08/05/2021-indeterminate 61m solid mass in the right hepatic lobe.   MRI of the liver 09/06/2021-multifocal rim-enhancing lesions throughout both lobes of the liver.  Index lesion within the lateral dome of right hepatic lobe measures 1.1 cm, segment 4A lesion measures 1.6 x 1.4 cm, segment 2 lesion measures 0.8 x 0.7 cm, posteromedial margin of the right hepatic lobe subcapsular lesion measures 2.5 x 2.0 cm 09/16/2021 CT angio neck-9 mm right upper lobe nodule Biopsy of a right liver mass on 09/23/2021.  Pathology shows poorly differentiated adenocarcinoma positive for cytokeratin 7, CDX2 and cytokeratin 20 and negative for TTF-1, PSA and prostein.  Differential diagnoses include pancreatobiliary and less likely upper GI. HER2 positive by FISH PD-L1 combined positive score 0% MSS equivocal, tumor mutation burden 0, ERBB2 amplification equivocal 09/29/2021 CA 19-9 81, CEA 33 PET scan 10/10/2021-solitary 10 mm right upper lobe pulmonary nodule with minimal FDG uptake.  7.5 mm left internal mammary lymph node hypermetabolic with SUV max 46.26  Numerous hepatic lesions.  Periportal lymphadenopathy SUV max 8.18.  2 adjacent lesions noted in the mesentery in the right mid abdomen with somewhat irregular margins, SUV 8.09.  No hypermetabolic colonic lesion identified to suggest a primary colon cancer.  No pancreatic lesion.  No gastric lesion.  No retroperitoneal lymphadenopathy. Upper endoscopy 10/13/2021-no mass, H. pylori gastritis on  gastric biopsy Cycle 1 FOLFOX 10/19/2021 Cycle 2 FOLFOX 11/02/2021, Emend added, oxaliplatin dose reduced Hospital admission 09/16/2021 - 09/19/2021 with gait disturbance-multifocal embolic stroke on MRI 36/02/6802, echo with interatrial shunt, transcranial Doppler with bubble study indicative of a medium size right to left shunt, lower extremity Doppler studies negative for DVT Hospital admission 09/20/2021 -  09/22/2021 with chest pain-CT coronary with no acute findings.   Diabetes Hypertension BPH Chronic kidney disease    Disposition: 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  The chemotherapy was complicated by delayed nausea and prolonged cold sensitivity.  The platelet count is mildly decreased today.  I dose reduce the oxaliplatin.  Emend will be added to the antiemetic regimen.  He will continue tramadol as needed for pain.  I refilled the tramadol prescription.  We discussed results of molecular testing.  The HER2 returned positive by FISH.  He will be a candidate for HER2 directed therapy in the future.  He has an unknown primary carcinoma though I suspect cholangiocarcinoma or an upper gastrointestinal primary.  There was not adequate tissue to add mismatch repair protein testing on the liver biopsy.  We can submit peripheral blood for [employer] testing.  He is scheduled for a second opinion appointment with Dr. Fanny Skates next week.  [name] will return for an office visit and cycle 3 chemotherapy in 2 weeks.  [name], MD  11/02/2021  9:15 AM

## 2021-11-02 NOTE — Progress Notes (Signed)
Oxaliplatin dose reduced today and Emend added

## 2021-11-02 NOTE — Patient Instructions (Addendum)
[place]   The chemotherapy medication bag should finish at 46 hours, 96 hours, or 7 days. For example, if your pump is scheduled for 46 hours and it was put on at 4:00 p.m., it should finish at 2:00 p.m. the day it is scheduled to come off regardless of your appointment time.     Estimated time to finish at  Friday, 11:30 11/04/2021.   If the display on your pump reads "Low Volume" and it is beeping, take the batteries out of the pump and come to the cancer center for it to be taken off.   If the pump alarms go off prior to the pump reading "Low Volume" then call [phone] and someone can assist you.  If the plunger comes out and the chemotherapy medication is leaking out, please use your home chemo spill kit to clean up the spill. Do NOT use paper towels or other household products.  If you have problems or questions regarding your pump, please call either [phone] (24 hours a day) or the cancer center Monday-Friday 8:00 a.m.- 4:30 p.m. at the clinic number and we will assist you. If you are unable to get assistance, then go to the nearest Emergency Department and ask the staff to contact the IV team for assistance.  Discharge Instructions: Thank you for choosing [facility] to provide your oncology and hematology care.   If you have a lab appointment with the [facility], please go directly to the [facility] and check in at the registration area.   Wear comfortable clothing and clothing appropriate for easy access to any Portacath or PICC line.   We strive to give you quality time with your provider. You may need to reschedule your appointment if you arrive late (15 or more minutes).  Arriving late affects you and other patients whose appointments are after yours.  Also, if you miss three or more appointments without notifying the office, you may be dismissed from the clinic at the providers discretion.      For prescription refill  requests, have your pharmacy contact our office and allow 72 hours for refills to be completed.    Today you received the following chemotherapy and/or immunotherapy agents oxaliplatin, leucovorin, fluorouracil      To help prevent nausea and vomiting after your treatment, we encourage you to take your nausea medication as directed.  BELOW ARE SYMPTOMS THAT SHOULD BE REPORTED IMMEDIATELY: *FEVER GREATER THAN 100.4 F (38 C) OR HIGHER *CHILLS OR SWEATING *NAUSEA AND VOMITING THAT IS NOT CONTROLLED WITH YOUR NAUSEA MEDICATION *UNUSUAL SHORTNESS OF BREATH *UNUSUAL BRUISING OR BLEEDING *URINARY PROBLEMS (pain or burning when urinating, or frequent urination) *BOWEL PROBLEMS (unusual diarrhea, constipation, pain near the anus) TENDERNESS IN MOUTH AND THROAT WITH OR WITHOUT PRESENCE OF ULCERS (sore throat, sores in mouth, or a toothache) UNUSUAL RASH, SWELLING OR PAIN  UNUSUAL VAGINAL DISCHARGE OR ITCHING   Items with * indicate a potential emergency and should be followed up as soon as possible or go to the Emergency Department if any problems should occur.  Please show the CHEMOTHERAPY ALERT CARD or IMMUNOTHERAPY ALERT CARD at check-in to the Emergency Department and triage nurse.  Should you have questions after your visit or need to cancel or reschedule your appointment, please contact [facility]  Dept: [phone]  and follow the prompts.  Office hours are 8:00 a.m. to 4:30 p.m. Monday - Friday. Please note that voicemails left  after 4:00 p.m. may not be returned until the following business day.  We are closed weekends and major holidays. You have access to a nurse at all times for urgent questions. Please call the main number to the clinic Dept: [phone] and follow the prompts.   For any non-urgent questions, you may also contact your provider using MyChart. We now offer e-Visits for anyone 78 and older to request care online for non-urgent symptoms. For  details visit [email or web address].   Also download the MyChart app! Go to the app store, search "MyChart", open the app, select [facility], and log in with your MyChart username and password.  Due to Covid, a mask is required upon entering the hospital/clinic. If you do not have a mask, one will be given to you upon arrival. For doctor visits, patients may have 1 support person aged 64 or older with them. For treatment visits, patients cannot have anyone with them due to current Covid guidelines and our immunocompromised population.   Oxaliplatin Injection What is this medication? OXALIPLATIN (ox AL i PLA tin) is a chemotherapy drug. It targets fast dividing cells, like cancer cells, and causes these cells to die. This medicine is used to treat cancers of the colon and rectum, and many other cancers. This medicine may be used for other purposes; ask your health care provider or pharmacist if you have questions. COMMON BRAND NAME(S): Eloxatin What should I tell my care team before I take this medication? They need to know if you have any of these conditions: heart disease history of irregular heartbeat liver disease low blood counts, like white cells, platelets, or red blood cells lung or breathing disease, like asthma take medicines that treat or prevent blood clots tingling of the fingers or toes, or other nerve disorder an unusual or allergic reaction to oxaliplatin, other chemotherapy, other medicines, foods, dyes, or preservatives pregnant or trying to get pregnant breast-feeding How should I use this medication? This drug is given as an infusion into a vein. It is administered in a hospital or clinic by a specially trained health care professional. Talk to your pediatrician regarding the use of this medicine in children. Special care may be needed. Overdosage: If you think you have taken too much of this medicine contact a poison control center or emergency room at once. NOTE:  This medicine is only for you. Do not share this medicine with others. What if I miss a dose? It is important not to miss a dose. Call your doctor or health care professional if you are unable to keep an appointment. What may interact with this medication? Do not take this medicine with any of the following medications: cisapride dronedarone pimozide thioridazine This medicine may also interact with the following medications: aspirin and aspirin-like medicines certain medicines that treat or prevent blood clots like warfarin, apixaban, dabigatran, and rivaroxaban cisplatin cyclosporine diuretics medicines for infection like acyclovir, adefovir, amphotericin B, bacitracin, cidofovir, foscarnet, ganciclovir, gentamicin, pentamidine, vancomycin NSAIDs, medicines for pain and inflammation, like ibuprofen or naproxen other medicines that prolong the QT interval (an abnormal heart rhythm) pamidronate zoledronic acid This list may not describe all possible interactions. Give your health care provider a list of all the medicines, herbs, non-prescription drugs, or dietary supplements you use. Also tell them if you smoke, drink alcohol, or use illegal drugs. Some items may interact with your medicine. What should I watch for while using this medication? Your condition will be monitored carefully while you are receiving this medicine.  You may need blood work done while you are taking this medicine. This medicine may make you feel generally unwell. This is not uncommon as chemotherapy can affect healthy cells as well as cancer cells. Report any side effects. Continue your course of treatment even though you feel ill unless your healthcare professional tells you to stop. This medicine can make you more sensitive to cold. Do not drink cold drinks or use ice. Cover exposed skin before coming in contact with cold temperatures or cold objects. When out in cold weather wear warm clothing and cover your mouth  and nose to warm the air that goes into your lungs. Tell your doctor if you get sensitive to the cold. Do not become pregnant while taking this medicine or for 9 months after stopping it. Women should inform their health care professional if they wish to become pregnant or think they might be pregnant. Men should not father a child while taking this medicine and for 6 months after stopping it. There is potential for serious side effects to an unborn child. Talk to your health care professional for more information. Do not breast-feed a child while taking this medicine or for 3 months after stopping it. This medicine has caused ovarian failure in some women. This medicine may make it more difficult to get pregnant. Talk to your health care professional if you are concerned about your fertility. This medicine has caused decreased sperm counts in some men. This may make it more difficult to father a child. Talk to your health care professional if you are concerned about your fertility. This medicine may increase your risk of getting an infection. Call your health care professional for advice if you get a fever, chills, or sore throat, or other symptoms of a cold or flu. Do not treat yourself. Try to avoid being around people who are sick. Avoid taking medicines that contain aspirin, acetaminophen, ibuprofen, naproxen, or ketoprofen unless instructed by your health care professional. These medicines may hide a fever. Be careful brushing or flossing your teeth or using a toothpick because you may get an infection or bleed more easily. If you have any dental work done, tell your dentist you are receiving this medicine. What side effects may I notice from receiving this medication? Side effects that you should report to your doctor or health care professional as soon as possible: allergic reactions like skin rash, itching or hives, swelling of the face, lips, or tongue breathing problems cough low blood counts  - this medicine may decrease the number of white blood cells, red blood cells, and platelets. You may be at increased risk for infections and bleeding nausea, vomiting pain, redness, or irritation at site where injected pain, tingling, numbness in the hands or feet signs and symptoms of bleeding such as bloody or black, tarry stools; red or dark brown urine; spitting up blood or brown material that looks like coffee grounds; red spots on the skin; unusual bruising or bleeding from the eyes, gums, or nose signs and symptoms of a dangerous change in heartbeat or heart rhythm like chest pain; dizziness; fast, irregular heartbeat; palpitations; feeling faint or lightheaded; falls signs and symptoms of infection like fever; chills; cough; sore throat; pain or trouble passing urine signs and symptoms of liver injury like dark yellow or brown urine; general ill feeling or flu-like symptoms; light-colored stools; loss of appetite; nausea; right upper belly pain; unusually weak or tired; yellowing of the eyes or skin signs and symptoms of low  red blood cells or anemia such as unusually weak or tired; feeling faint or lightheaded; falls signs and symptoms of muscle injury like dark urine; trouble passing urine or change in the amount of urine; unusually weak or tired; muscle pain; back pain Side effects that usually do not require medical attention (report to your doctor or health care professional if they continue or are bothersome): changes in taste diarrhea gas hair loss loss of appetite mouth sores This list may not describe all possible side effects. Call your doctor for medical advice about side effects. You may report side effects to FDA at 1-800-FDA-1088. Where should I keep my medication? This drug is given in a hospital or clinic and will not be stored at home. NOTE: This sheet is a summary. It may not cover all possible information. If you have questions about this medicine, talk to your doctor,  pharmacist, or health care provider.  2022 Elsevier/Gold Standard (2021-07-19 00:00:00)  Leucovorin injection What is this medication? LEUCOVORIN (loo koe VOR in) is used to prevent or treat the harmful effects of some medicines. This medicine is used to treat anemia caused by a low amount of folic acid in the body. It is also used with 5-fluorouracil (5-FU) to treat colon cancer. This medicine may be used for other purposes; ask your health care provider or pharmacist if you have questions. What should I tell my care team before I take this medication? They need to know if you have any of these conditions: anemia from low levels of vitamin B-12 in the blood an unusual or allergic reaction to leucovorin, folic acid, other medicines, foods, dyes, or preservatives pregnant or trying to get pregnant breast-feeding How should I use this medication? This medicine is for injection into a muscle or into a vein. It is given by a health care professional in a hospital or clinic setting. Talk to your pediatrician regarding the use of this medicine in children. Special care may be needed. Overdosage: If you think you have taken too much of this medicine contact a poison control center or emergency room at once. NOTE: This medicine is only for you. Do not share this medicine with others. What if I miss a dose? This does not apply. What may interact with this medication? capecitabine fluorouracil phenobarbital phenytoin primidone trimethoprim-sulfamethoxazole This list may not describe all possible interactions. Give your health care provider a list of all the medicines, herbs, non-prescription drugs, or dietary supplements you use. Also tell them if you smoke, drink alcohol, or use illegal drugs. Some items may interact with your medicine. What should I watch for while using this medication? Your condition will be monitored carefully while you are receiving this medicine. This medicine may  increase the side effects of 5-fluorouracil, 5-FU. Tell your doctor or health care professional if you have diarrhea or mouth sores that do not get better or that get worse. What side effects may I notice from receiving this medication? Side effects that you should report to your doctor or health care professional as soon as possible: allergic reactions like skin rash, itching or hives, swelling of the face, lips, or tongue breathing problems fever, infection mouth sores unusual bleeding or bruising unusually weak or tired Side effects that usually do not require medical attention (report to your doctor or health care professional if they continue or are bothersome): constipation or diarrhea loss of appetite nausea, vomiting This list may not describe all possible side effects. Call your doctor for medical advice  about side effects. You may report side effects to FDA at 1-800-FDA-1088. Where should I keep my medication? This drug is given in a hospital or clinic and will not be stored at home. NOTE: This sheet is a summary. It may not cover all possible information. If you have questions about this medicine, talk to your doctor, pharmacist, or health care provider.  2022 Elsevier/Gold Standard (2008-05-07 00:00:00)  Fluorouracil, 5-FU injection What is this medication? FLUOROURACIL, 5-FU (flure oh YOOR a sil) is a chemotherapy drug. It slows the growth of cancer cells. This medicine is used to treat many types of cancer like breast cancer, colon or rectal cancer, pancreatic cancer, and stomach cancer. This medicine may be used for other purposes; ask your health care provider or pharmacist if you have questions. COMMON BRAND NAME(S): Adrucil What should I tell my care team before I take this medication? They need to know if you have any of these conditions: blood disorders dihydropyrimidine dehydrogenase (DPD) deficiency infection (especially a virus infection such as chickenpox, cold  sores, or herpes) kidney disease liver disease malnourished, poor nutrition recent or ongoing radiation therapy an unusual or allergic reaction to fluorouracil, other chemotherapy, other medicines, foods, dyes, or preservatives pregnant or trying to get pregnant breast-feeding How should I use this medication? This drug is given as an infusion or injection into a vein. It is administered in a hospital or clinic by a specially trained health care professional. Talk to your pediatrician regarding the use of this medicine in children. Special care may be needed. Overdosage: If you think you have taken too much of this medicine contact a poison control center or emergency room at once. NOTE: This medicine is only for you. Do not share this medicine with others. What if I miss a dose? It is important not to miss your dose. Call your doctor or health care professional if you are unable to keep an appointment. What may interact with this medication? Do not take this medicine with any of the following medications: live virus vaccines This medicine may also interact with the following medications: medicines that treat or prevent blood clots like warfarin, enoxaparin, and dalteparin This list may not describe all possible interactions. Give your health care provider a list of all the medicines, herbs, non-prescription drugs, or dietary supplements you use. Also tell them if you smoke, drink alcohol, or use illegal drugs. Some items may interact with your medicine. What should I watch for while using this medication? Visit your doctor for checks on your progress. This drug may make you feel generally unwell. This is not uncommon, as chemotherapy can affect healthy cells as well as cancer cells. Report any side effects. Continue your course of treatment even though you feel ill unless your doctor tells you to stop. In some cases, you may be given additional medicines to help with side effects. Follow all  directions for their use. Call your doctor or health care professional for advice if you get a fever, chills or sore throat, or other symptoms of a cold or flu. Do not treat yourself. This drug decreases your body's ability to fight infections. Try to avoid being around people who are sick. This medicine may increase your risk to bruise or bleed. Call your doctor or health care professional if you notice any unusual bleeding. Be careful brushing and flossing your teeth or using a toothpick because you may get an infection or bleed more easily. If you have any dental work done, tell  your dentist you are receiving this medicine. Avoid taking products that contain aspirin, acetaminophen, ibuprofen, naproxen, or ketoprofen unless instructed by your doctor. These medicines may hide a fever. Do not become pregnant while taking this medicine. Women should inform their doctor if they wish to become pregnant or think they might be pregnant. There is a potential for serious side effects to an unborn child. Talk to your health care professional or pharmacist for more information. Do not breast-feed an infant while taking this medicine. Men should inform their doctor if they wish to father a child. This medicine may lower sperm counts. Do not treat diarrhea with over the counter products. Contact your doctor if you have diarrhea that lasts more than 2 days or if it is severe and watery. This medicine can make you more sensitive to the sun. Keep out of the sun. If you cannot avoid being in the sun, wear protective clothing and use sunscreen. Do not use sun lamps or tanning beds/booths. What side effects may I notice from receiving this medication? Side effects that you should report to your doctor or health care professional as soon as possible: allergic reactions like skin rash, itching or hives, swelling of the face, lips, or tongue low blood counts - this medicine may decrease the number of white blood cells, red  blood cells and platelets. You may be at increased risk for infections and bleeding. signs of infection - fever or chills, cough, sore throat, pain or difficulty passing urine signs of decreased platelets or bleeding - bruising, pinpoint red spots on the skin, black, tarry stools, blood in the urine signs of decreased red blood cells - unusually weak or tired, fainting spells, lightheadedness breathing problems changes in vision chest pain mouth sores nausea and vomiting pain, swelling, redness at site where injected pain, tingling, numbness in the hands or feet redness, swelling, or sores on hands or feet stomach pain unusual bleeding Side effects that usually do not require medical attention (report to your doctor or health care professional if they continue or are bothersome): changes in finger or toe nails diarrhea dry or itchy skin hair loss headache loss of appetite sensitivity of eyes to the light stomach upset unusually teary eyes This list may not describe all possible side effects. Call your doctor for medical advice about side effects. You may report side effects to FDA at 1-800-FDA-1088. Where should I keep my medication? This drug is given in a hospital or clinic and will not be stored at home. NOTE: This sheet is a summary. It may not cover all possible information. If you have questions about this medicine, talk to your doctor, pharmacist, or health care provider.  2022 Elsevier/Gold Standard (2021-07-19 00:00:00)

## 2021-11-02 NOTE — Telephone Encounter (Addendum)
Patient seen by [name] today  Vitals are within treatment parameters.  Labs reviewed by [name] and are within treatment parameters.  Per physician team, patient is ready for treatment. Please note that modifications are being made to the treatment plan including dose reduction to oxaliplatin and Emend added to treatment

## 2021-11-03 ENCOUNTER — Encounter: Payer: Self-pay | Admitting: Physical Therapy

## 2021-11-03 ENCOUNTER — Encounter: Payer: Self-pay | Admitting: Oncology

## 2021-11-03 ENCOUNTER — Ambulatory Visit: Payer: Medicare HMO | Admitting: Physical Therapy

## 2021-11-03 DIAGNOSIS — E1122 Type 2 diabetes mellitus with diabetic chronic kidney disease: Secondary | ICD-10-CM | POA: Diagnosis not present

## 2021-11-03 DIAGNOSIS — R293 Abnormal posture: Secondary | ICD-10-CM

## 2021-11-03 DIAGNOSIS — I129 Hypertensive chronic kidney disease with stage 1 through stage 4 chronic kidney disease, or unspecified chronic kidney disease: Secondary | ICD-10-CM | POA: Diagnosis not present

## 2021-11-03 DIAGNOSIS — M6281 Muscle weakness (generalized): Secondary | ICD-10-CM

## 2021-11-03 DIAGNOSIS — C787 Secondary malignant neoplasm of liver and intrahepatic bile duct: Secondary | ICD-10-CM | POA: Diagnosis not present

## 2021-11-03 DIAGNOSIS — R2681 Unsteadiness on feet: Secondary | ICD-10-CM | POA: Diagnosis not present

## 2021-11-03 DIAGNOSIS — R2689 Other abnormalities of gait and mobility: Secondary | ICD-10-CM

## 2021-11-03 DIAGNOSIS — Z452 Encounter for adjustment and management of vascular access device: Secondary | ICD-10-CM | POA: Diagnosis not present

## 2021-11-03 DIAGNOSIS — Z5111 Encounter for antineoplastic chemotherapy: Secondary | ICD-10-CM | POA: Diagnosis not present

## 2021-11-03 DIAGNOSIS — N189 Chronic kidney disease, unspecified: Secondary | ICD-10-CM | POA: Diagnosis not present

## 2021-11-03 DIAGNOSIS — C801 Malignant (primary) neoplasm, unspecified: Secondary | ICD-10-CM | POA: Diagnosis not present

## 2021-11-03 NOTE — Therapy (Signed)
[facility] [address] [place], [place], [address] Phone: [phone]   Fax:  [phone]  Physical Therapy Progress Note  Patient Details  Name: Robert Warner MRN: [id number] Date of Birth: 05-30-1941 Referring Provider (PT): Antonieta Pert, MD   Encounter Date: 11/03/2021   PT End of Session - 11/03/21 1241     Visit Number 7    Number of Visits 10    Date for PT Re-Evaluation 11/11/21    Authorization Type [employer]    Authorization Time Period 9 visits 11/18-12/30    Authorization - Visit Number 6    Authorization - Number of Visits 9    PT Start Time 1150    PT Stop Time 1238    PT Time Calculation (min) 48 min    Equipment Utilized During Treatment Gait belt    Activity Tolerance Patient tolerated treatment well    Behavior During Therapy [facility] for tasks assessed/performed;Impulsive             Past Medical History:  Diagnosis Date   Arthritis    Cancer ([place])    Cataract    Diabetes mellitus    Hyperlipidemia    Hypertension    Stroke [facility])     Past Surgical History:  Procedure Laterality Date   APPENDECTOMY  1968   CATARACT EXTRACTION Bilateral 07/2000   states 2 weeks ago (first of sept) OD due in Dec    COLONOSCOPY  2004,2009   Negative, Dr. Sharlett Iles    IR IMAGING GUIDED PORT INSERTION  10/14/2021   KIDNEY STONE SURGERY  10/2018   PROSTATE BIOPSY  2006   Dr.Sigmund Tannebaum    There were no vitals filed for this visit.   Subjective Assessment - 11/03/21 1152     Subjective Feeling pretty good. Feels stronger and more stable since starting therapy, however still has occasional episodes of imbalance.    Pertinent History DMT2, recent CVA, HTN, HLD    Diagnostic tests 09/16/21 brain MRI: Multiple small foci of restricted diffusion throughout the bilateral cerebral and cerebellar hemispheres, as well as the right thalamus,  consistent with acute infarcts. Additional subacute infarcts in the  vermis  and left aspect of the splenium of the corpus callosum. Given  different vascular territories, an embolic etiology is favored.    Patient Stated Goals improve balance    Currently in Pain? No/denies                [facility] PT Assessment - 11/03/21 1202       Assessment   Medical Diagnosis Cerebrovascular accident (CVA) due to embolism of precerebral artery    Referring Provider (PT) Antonieta Pert, MD    Onset Date/Surgical Date 09/16/21      Strength   Right Hip Flexion 4+/5    Right Hip ABduction 4+/5    Right Hip ADduction 4+/5    Left Hip Flexion 4/5    Left Hip ABduction 4+/5    Left Hip ADduction 4/5    Right Knee Flexion 4+/5    Right Knee Extension 5/5    Left Knee Flexion 4+/5    Left Knee Extension 5/5    Right Ankle Dorsiflexion 4/5    Right Ankle Plantar Flexion 4/5    Left Ankle Dorsiflexion 4+/5    Left Ankle Plantar Flexion 4+/5      Functional Gait  Assessment   Gait assessed  Yes    Gait Level Surface Walks 20 ft  in less than 7 sec but greater than 5.5 sec, uses assistive device, slower speed, mild gait deviations, or deviates 6-10 in outside of the 12 in walkway width.    Change in Gait Speed Able to smoothly change walking speed without loss of balance or gait deviation. Deviate no more than 6 in outside of the 12 in walkway width.    Gait with Horizontal Head Turns Performs head turns smoothly with no change in gait. Deviates no more than 6 in outside 12 in walkway width    Gait with Vertical Head Turns Performs task with moderate change in gait velocity, slows down, deviates 10-15 in outside 12 in walkway width but recovers, can continue to walk.    Gait and Pivot Turn Pivot turns safely within 3 sec and stops quickly with no loss of balance.    Step Over Obstacle Is able to step over 2 stacked shoe boxes taped together (9 in total height) without changing gait speed. No evidence of imbalance.    Gait with Narrow Base of Support Ambulates 4-7 steps.    Gait  with Eyes Closed Walks 20 ft, uses assistive device, slower speed, mild gait deviations, deviates 6-10 in outside 12 in walkway width. Ambulates 20 ft in less than 9 sec but greater than 7 sec.    Ambulating Backwards Walks 20 ft, uses assistive device, slower speed, mild gait deviations, deviates 6-10 in outside 12 in walkway width.    Steps Alternating feet, no rail.    Total Score 23                           [facility] Adult PT Treatment/Exercise - 11/03/21 0001       Knee/Hip Exercises: Aerobic   Nustep L5 x 6 min (UEs/LEs)      Knee/Hip Exercises: Standing   Other Standing Knee Exercises review of new HEP including resisted marching, heel/toe raise, romberg with head nods, and resisted ankle DF                     PT Education - 11/03/21 1240     Education Details discussion on objective progress and remaining impairments; update to HEP- Access Code [email or web address] to be performed at counter top for safety; discussed 30 day hold until patient's schedule is more available    Person(s) Educated Patient    Methods Explanation;Demonstration;Tactile cues;Verbal cues;Handout    Comprehension Verbalized understanding;Returned demonstration              PT Short Term Goals - 11/03/21 1157       PT SHORT TERM GOAL #1   Title Patient to be independent with initial HEP.    Time 3    Period Weeks    Status Achieved    Target Date 10/21/21               PT Long Term Goals - 11/03/21 1157       PT LONG TERM GOAL #1   Title Patient to be independent with advanced HEP.    Time 6    Period Weeks    Status Achieved      PT LONG TERM GOAL #2   Title Patient to complete TUG in <14 sec with LRAD in order to decrease risk of falls.    Time 6    Period Weeks    Status Achieved      PT LONG TERM GOAL #3  Title Patient to score at least 23/30 on FGA in order to decrease risk of falls.    Time 6    Period Weeks    Status Achieved      PT LONG TERM GOAL  #4   Title Patient to demonstrate B LE strength >/=4+/5.    Time 6    Period Weeks    Status Partially Met   still limited in L hip flexion, adduction, and R ankle DF/PF     PT LONG TERM GOAL #5   Title Patient will ambulate over outdoor surfaces with LRAD while performing head turns to scan environment with good stability in order to indicate safe community mobility.    Time 6    Period Weeks    Status Partially Met   NT today d/t weather- has demonstrated good stability with SPC outdoors in past sessions                  Plan - 11/03/21 1242     Clinical Impression Statement Patient arrived to session with report of feeling well today. Notes improvements in strength in stability since starting PT, however still notes occasional episodes of imbalance. Strength testing revealed improvement in B knee strength; still limited in L hip flexion, adduction, and R ankle DF/PF. Gait training outside not tested today d/t weather, however patient reports feeling stable with SPC on outdoor surfaces and has demonstrated good stability on outdoor surfaces in past sessions. Patient scored 23/30 on FGA, indicating a decreased risk of falls. However, still with difficulty maintaining stability with narrow BOS. Updated and reviewed HEP for max benefit- patient tolerated these exercises well and reported understanding of HEP changes. D/t patient having several medical appointments for tx of CA, placing patient on 30 day hold until his schedule is more available. Patient in agreement.    Comorbidities DMT2, recent CVA, HTN, HLD    PT Treatment/Interventions ADLs/Self Care Home Management;Canalith Repostioning;Cryotherapy;Electrical Stimulation;DME Instruction;Ultrasound;Moist Heat;Gait training;Stair training;Functional mobility training;Therapeutic activities;Therapeutic exercise;Balance training;Neuromuscular re-education;Manual techniques;Patient/family education;Passive range of motion;Dry  needling;Energy conservation;Vestibular;Vasopneumatic Device;Taping    PT Next Visit Plan 30 day hold at this time    Consulted and Agree with Plan of Care Patient             Patient will benefit from skilled therapeutic intervention in order to improve the following deficits and impairments:  Abnormal gait, Decreased coordination, Decreased range of motion, Decreased safety awareness, Decreased activity tolerance, Decreased balance, Impaired flexibility, Improper body mechanics, Postural dysfunction, Decreased strength  Visit Diagnosis: Unsteadiness on feet  Muscle weakness (generalized)  Other abnormalities of gait and mobility  Abnormal posture     Problem List Patient Active Problem List   Diagnosis Date Noted   Cancer with unknown primary site ([place]) 10/11/2021   Goals of care, counseling/discussion 10/11/2021   Chest pain 09/21/2021   Recent cerebrovascular accident (CVA) 09/21/2021   Interatrial cardiac shunt 09/21/2021   CKD (chronic kidney disease) stage 3, GFR 30-59 ml/min ([place]) 50/01/7047   Acute embolic stroke ([place]) 88/91/6945   Mixed diabetic hyperlipidemia associated with type 2 diabetes mellitus ([place]) 09/16/2021   Liver masses 09/16/2021   Essential hypertension 03/12/2012   Type 2 diabetes mellitus without complication, without long-term current use of insulin ([place]) 06/15/2008   BPH (benign prostatic hyperplasia) 06/15/2008    Janene Harvey, PT, DPT 11/03/21 12:48 PM   [facility] [address] [place], [place], [address] Phone: [phone]   Fax:  [phone]  Name: Robert Warner MRN: [id number] Date of Birth: August 01, 1941

## 2021-11-04 ENCOUNTER — Ambulatory Visit: Payer: Medicare HMO | Admitting: Family Medicine

## 2021-11-04 ENCOUNTER — Other Ambulatory Visit: Payer: Self-pay

## 2021-11-04 ENCOUNTER — Telehealth: Payer: Self-pay | Admitting: *Deleted

## 2021-11-04 ENCOUNTER — Inpatient Hospital Stay: Payer: Medicare HMO

## 2021-11-04 ENCOUNTER — Ambulatory Visit: Payer: Medicare HMO | Admitting: Physical Therapy

## 2021-11-04 VITALS — BP 159/97 | HR 69 | Temp 98.2°F | Resp 20

## 2021-11-04 DIAGNOSIS — I129 Hypertensive chronic kidney disease with stage 1 through stage 4 chronic kidney disease, or unspecified chronic kidney disease: Secondary | ICD-10-CM | POA: Diagnosis not present

## 2021-11-04 DIAGNOSIS — E1122 Type 2 diabetes mellitus with diabetic chronic kidney disease: Secondary | ICD-10-CM | POA: Diagnosis not present

## 2021-11-04 DIAGNOSIS — C787 Secondary malignant neoplasm of liver and intrahepatic bile duct: Secondary | ICD-10-CM | POA: Diagnosis not present

## 2021-11-04 DIAGNOSIS — N189 Chronic kidney disease, unspecified: Secondary | ICD-10-CM | POA: Diagnosis not present

## 2021-11-04 DIAGNOSIS — R2681 Unsteadiness on feet: Secondary | ICD-10-CM | POA: Diagnosis not present

## 2021-11-04 DIAGNOSIS — Z452 Encounter for adjustment and management of vascular access device: Secondary | ICD-10-CM | POA: Diagnosis not present

## 2021-11-04 DIAGNOSIS — M6281 Muscle weakness (generalized): Secondary | ICD-10-CM | POA: Diagnosis not present

## 2021-11-04 DIAGNOSIS — C801 Malignant (primary) neoplasm, unspecified: Secondary | ICD-10-CM

## 2021-11-04 DIAGNOSIS — Z5111 Encounter for antineoplastic chemotherapy: Secondary | ICD-10-CM | POA: Diagnosis not present

## 2021-11-04 MED ORDER — HEPARIN SOD (PORK) LOCK FLUSH 100 UNIT/ML IV SOLN
500.0000 [IU] | Freq: Once | INTRAVENOUS | Status: AC | PRN
  Administered 2021-11-04: 12:00:00 500 [IU]

## 2021-11-04 MED ORDER — SODIUM CHLORIDE 0.9% FLUSH
10.0000 mL | INTRAVENOUS | Status: DC | PRN
  Administered 2021-11-04: 12:00:00 10 mL

## 2021-11-04 NOTE — Patient Instructions (Signed)

## 2021-11-08 DIAGNOSIS — C801 Malignant (primary) neoplasm, unspecified: Secondary | ICD-10-CM | POA: Diagnosis not present

## 2021-11-08 DIAGNOSIS — K7689 Other specified diseases of liver: Secondary | ICD-10-CM | POA: Diagnosis not present

## 2021-11-08 DIAGNOSIS — R109 Unspecified abdominal pain: Secondary | ICD-10-CM | POA: Diagnosis not present

## 2021-11-09 ENCOUNTER — Telehealth (INDEPENDENT_AMBULATORY_CARE_PROVIDER_SITE_OTHER): Payer: Medicare HMO | Admitting: Family Medicine

## 2021-11-09 ENCOUNTER — Other Ambulatory Visit: Payer: Self-pay | Admitting: *Deleted

## 2021-11-09 VITALS — Ht 70.0 in | Wt 179.0 lb

## 2021-11-09 DIAGNOSIS — E1165 Type 2 diabetes mellitus with hyperglycemia: Secondary | ICD-10-CM | POA: Diagnosis not present

## 2021-11-09 DIAGNOSIS — C801 Malignant (primary) neoplasm, unspecified: Secondary | ICD-10-CM

## 2021-11-09 MED ORDER — TOUJEO MAX SOLOSTAR 300 UNIT/ML ~~LOC~~ SOPN: 10.0000 [IU] | PEN_INJECTOR | Freq: Every day | SUBCUTANEOUS | 1 refills | Status: DC

## 2021-11-09 NOTE — Progress Notes (Signed)
Patient ID: Robert Warner, male   DOB: Dec 20, 1940, 80 y.o.   MRN: [id number]  This visit type was conducted due to national recommendations for restrictions regarding the COVID-19 pandemic in an effort to limit this patient's exposure and mitigate transmission in our community.   Virtual Visit via Video Note  I connected with Robert Warner  on 11/09/21 at  4:15 PM EST by a video enabled telemedicine application and verified that I am speaking with the correct person using two identifiers.  Location patient: home Location provider:work or home office Persons participating in the virtual visit: patient, provider  I discussed the limitations of evaluation and management by telemedicine and the availability of in person appointments. The patient expressed understanding and agreed to proceed.   HPI:  Patient has poorly differentiated adenocarcinoma involving the liver.  Recent ultrasound in September showing indeterminate 31mm solid mass right hepatic lobe.  MRI of the liver showed multiple rim-enhancing lesions throughout both lobes of the liver.  Biopsy of the right liver mass early November showed poorly differentiated adenocarcinoma.  Differential including pancreaticobiliary and less likely upper GI.  Patient currently receiving chemotherapy with FOLFOX Still has occasional postprandial pains but tramadol helps.  Pain has been relatively mild.  He called because of concern for elevated blood sugars.  He has had CBGs 200-300 range fasting recently.  He is currently only taking metformin.  Last A1c back in November was 6.9%.  Blood sugars have definitely escalated since he started chemo.  He has lost about 20 pounds overall.  Had previously been on Rybelsus.  Has taken insulin previously during hospitalizations but not otherwise as outpatient.   ROS: See pertinent positives and negatives per HPI.  Past Medical History:  Diagnosis Date   Arthritis    Cancer ([place])    Cataract    Diabetes  mellitus    Hyperlipidemia    Hypertension    Stroke [facility])     Past Surgical History:  Procedure Laterality Date   APPENDECTOMY  1968   CATARACT EXTRACTION Bilateral 07/2000   states 2 weeks ago (first of sept) OD due in Dec    COLONOSCOPY  2004,2009   Negative, Dr. Sharlett Iles    IR IMAGING GUIDED PORT INSERTION  10/14/2021   KIDNEY STONE SURGERY  10/2018   PROSTATE BIOPSY  2006   Dr.Sigmund Tannebaum    Family History  Problem Relation Age of Onset   Hypertension Mother    Coronary artery disease Mother    Stroke Father 56   Stroke Sister    Pancreatic cancer Brother    Diabetes Maternal Aunt    Coronary artery disease Maternal Uncle        2 Maternal Uncles    Diabetes Maternal Uncle    Breast cancer Paternal Aunt    Coronary artery disease Paternal Aunt    Coronary artery disease Maternal Grandmother    Stroke Paternal Grandmother    Heart disease Neg Hx    Colon cancer Neg Hx    Esophageal cancer Neg Hx    Rectal cancer Neg Hx    Stomach cancer Neg Hx     SOCIAL HX: Quit smoking 1968.  Lives with wife.  Very supportive family.   Current Outpatient Medications:    acetaminophen (TYLENOL) 650 MG CR tablet, Take 650 mg by mouth every 8 (eight) hours as needed for pain., Disp: , Rfl:    atorvastatin (LIPITOR) 40 MG tablet, Take 1 tablet (40 mg total) by mouth daily., Disp:  90 tablet, Rfl: 1   Cholecalciferol (VITAMIN D3) 2000 UNITS TABS, Take 2,000 Units by mouth daily., Disp: , Rfl:    lidocaine-prilocaine (EMLA) cream, Apply 1 application topically as needed. Apply 1/2 tablespoon to port site 2 hours prior to stick and cover with Press-and-Seal to numb site for access, Disp: 30 g, Rfl: 1   metFORMIN (GLUCOPHAGE-XR) 750 MG 24 hr tablet, TAKE 1 TABLET(750 MG) BY MOUTH DAILY WITH BREAKFAST (Patient taking differently: Take 750 mg by mouth daily with breakfast.), Disp: 90 tablet, Rfl: 0   ondansetron (ZOFRAN) 8 MG tablet, Take 1 tablet (8 mg total) by mouth every 8  (eight) hours as needed for nausea or vomiting. Start 72 hours after each IV chemotherapy administration, Disp: 30 tablet, Rfl: 1   prochlorperazine (COMPAZINE) 10 MG tablet, Take 1 tablet (10 mg total) by mouth every 6 (six) hours as needed for nausea., Disp: 60 tablet, Rfl: 1   silodosin (RAPAFLO) 8 MG CAPS capsule, Take 8 mg by mouth at bedtime., Disp: , Rfl:    traMADol (ULTRAM) 50 MG tablet, Take 1 tablet (50 mg total) by mouth every 6 (six) hours as needed for moderate pain., Disp: 60 tablet, Rfl: 0   TRAVATAN Z 0.004 % SOLN ophthalmic solution, Place 1 drop into both eyes at bedtime., Disp: , Rfl:    clopidogrel (PLAVIX) 75 MG tablet, Take 1 tablet (75 mg total) by mouth daily. Start after conforming with IR Doctor after biopsy (Patient not taking: Reported on 11/09/2021), Disp: 90 tablet, Rfl: 1   COVID-19 mRNA bivalent vaccine, Pfizer, (PFIZER COVID-19 VAC BIVALENT) injection, Inject into the muscle. (Patient not taking: Reported on 10/19/2021), Disp: 0.3 mL, Rfl: 0   glucose blood (FREESTYLE LITE) test strip, CHECK BLOOD SUGAR ONCE DAILY.  Dx:E11.9 (Patient not taking: Reported on 10/19/2021), Disp: 100 each, Rfl: 3   Lancets (FREESTYLE) lancets, Check blood sugar once daily (Patient not taking: Reported on 10/19/2021), Disp: 100 each, Rfl: 3   metroNIDAZOLE (FLAGYL) 500 MG tablet, Take 1 tablet (500 mg total) by mouth 2 (two) times daily. (Patient not taking: Reported on 11/09/2021), Disp: 28 tablet, Rfl: 0   pantoprazole (PROTONIX) 20 MG tablet, TAKE 1 TABLET(20 MG) BY MOUTH DAILY (Patient not taking: Reported on 10/13/2021), Disp: 90 tablet, Rfl: 0  EXAM:  VITALS per patient if applicable:  GENERAL: alert, oriented, appears well and in no acute distress  HEENT: atraumatic, conjunttiva clear, no obvious abnormalities on inspection of external nose and ears  NECK: normal movements of the head and neck  LUNGS: on inspection no signs of respiratory distress, breathing rate appears normal,  no obvious gross SOB, gasping or wheezing  CV: no obvious cyanosis  MS: moves all visible extremities without noticeable abnormality  PSYCH/NEURO: pleasant and cooperative, no obvious depression or anxiety, speech and thought processing grossly intact  ASSESSMENT AND PLAN:  Discussed the following assessment and plan:  Patient has type 2 diabetes.  His A1c has been stable but recent elevation in fasting blood sugars 200-300 range.  Patient, as above has poorly differentiated adenocarcinoma of the liver with uncertain primary.  -We discussed addition of insulin.  We will try Toujeo starting at 10 units once daily.  We did discuss titration up 2 units every few days until fasting blood sugars consistently 140 or less.  Could add short acting insulin but will try for simplicity to see if we can get reasonable control with once daily which will minimize hypoglycemia risk.     I  discussed the assessment and treatment plan with the patient. The patient was provided an opportunity to ask questions and all were answered. The patient agreed with the plan and demonstrated an understanding of the instructions.   The patient was advised to call back or seek an in-person evaluation if the symptoms worsen or if the condition fails to improve as anticipated.     [name], MD

## 2021-11-11 ENCOUNTER — Ambulatory Visit: Payer: Medicare HMO | Admitting: Physical Therapy

## 2021-11-12 ENCOUNTER — Encounter (HOSPITAL_COMMUNITY): Payer: Self-pay | Admitting: *Deleted

## 2021-11-12 ENCOUNTER — Ambulatory Visit (HOSPITAL_COMMUNITY)
Admission: EM | Admit: 2021-11-12 | Discharge: 2021-11-12 | Disposition: A | Discharge status: Home / Self Care | Payer: Medicare HMO | Attending: Physician Assistant | Admitting: Physician Assistant

## 2021-11-12 ENCOUNTER — Other Ambulatory Visit: Payer: Self-pay

## 2021-11-12 DIAGNOSIS — E1165 Type 2 diabetes mellitus with hyperglycemia: Secondary | ICD-10-CM

## 2021-11-12 DIAGNOSIS — R739 Hyperglycemia, unspecified: Secondary | ICD-10-CM

## 2021-11-12 DIAGNOSIS — Z794 Long term (current) use of insulin: Secondary | ICD-10-CM | POA: Diagnosis not present

## 2021-11-12 LAB — CBG MONITORING, ED: Glucose-Capillary: 409 mg/dL — ABNORMAL HIGH (ref 70–99)

## 2021-11-12 NOTE — ED Provider Notes (Signed)
[place]    CSN: [id number] Arrival date & time: 11/12/21  1106      History   Chief Complaint Chief Complaint  Patient presents with   Hyperglycemia    HPI Robert Warner is a 80 y.o. male.   Patient presents today companied by his wife who provides majority of history.  Reports that he recently had a video visit with his PCP a few days ago and was prescribed Toujeo due to persistent elevated blood sugars related to chemotherapy treatment.  Unfortunately, patient did not know how to use insulin pen and was told to come to urgent care by pharmacy for instruction.  He reports that this morning his blood sugar was approximately 300 and has been running high for the past several weeks.  Review of PCP last note indicates plan is to titrate long-acting insulin by adding 2 units with persistently elevated blood sugar readings until fasting blood sugar is 120 consistently.  Patient denies any current symptoms including lightheadedness, confusion, shortness of breath, fatigue, weakness, chest pain, nausea, vomiting.   Past Medical History:  Diagnosis Date   Arthritis    Cancer ([place])    Cataract    Diabetes mellitus    Hyperlipidemia    Hypertension    Stroke [facility])     Patient Active Problem List   Diagnosis Date Noted   Cancer with unknown primary site ([place]) 10/11/2021   Goals of care, counseling/discussion 10/11/2021   Chest pain 09/21/2021   Recent cerebrovascular accident (CVA) 09/21/2021   Interatrial cardiac shunt 09/21/2021   CKD (chronic kidney disease) stage 3, GFR 30-59 ml/min ([place]) 25/36/6440   Acute embolic stroke ([place]) 34/74/2595   Mixed diabetic hyperlipidemia associated with type 2 diabetes mellitus ([place]) 09/16/2021   Liver masses 09/16/2021   Essential hypertension 03/12/2012   Type 2 diabetes mellitus without complication, without long-term current use of insulin ([place]) 06/15/2008   BPH (benign prostatic hyperplasia) 06/15/2008    Past Surgical  History:  Procedure Laterality Date   APPENDECTOMY  1968   CATARACT EXTRACTION Bilateral 07/2000   states 2 weeks ago (first of sept) OD due in Dec    COLONOSCOPY  2004,2009   Negative, Dr. Sharlett Iles    IR IMAGING GUIDED PORT INSERTION  10/14/2021   KIDNEY STONE SURGERY  10/2018   PROSTATE BIOPSY  2006   Dr.Sigmund Tannebaum       Home Medications    Prior to Admission medications   Medication Sig Start Date End Date Taking? Authorizing Provider  acetaminophen (TYLENOL) 650 MG CR tablet Take 650 mg by mouth every 8 (eight) hours as needed for pain.    [provider]  atorvastatin (LIPITOR) 40 MG tablet Take 1 tablet (40 mg total) by mouth daily. 10/19/21 11/18/21  Burchette, Alinda Sierras, MD  Cholecalciferol (VITAMIN D3) 2000 UNITS TABS Take 2,000 Units by mouth daily.    [provider]  clopidogrel (PLAVIX) 75 MG tablet Take 1 tablet (75 mg total) by mouth daily. Start after conforming with IR Doctor after biopsy Patient not taking: Reported on 11/09/2021 10/19/21 11/18/21  [name], MD  COVID-19 mRNA bivalent vaccine, Pfizer, (PFIZER COVID-19 VAC BIVALENT) injection Inject into the muscle. Patient not taking: Reported on 10/19/2021 10/12/21   Carlyle Basques, MD  glucose blood (FREESTYLE LITE) test strip CHECK BLOOD SUGAR ONCE DAILY.  Dx:E11.9 Patient not taking: Reported on 10/19/2021 08/04/20   [name], MD  insulin glargine, 2 Unit Dial, (TOUJEO MAX SOLOSTAR) 300  UNIT/ML Solostar Pen Inject 10 Units into the skin daily. 11/09/21   Burchette, Alinda Sierras, MD  Lancets (FREESTYLE) lancets Check blood sugar once daily Patient not taking: Reported on 10/19/2021 03/15/12   Hendricks Limes, MD  lidocaine-prilocaine (EMLA) cream Apply 1 application topically as needed. Apply 1/2 tablespoon to port site 2 hours prior to stick and cover with Press-and-Seal to numb site for access 10/17/21   [name], MD  metFORMIN (GLUCOPHAGE-XR) 750 MG 24 hr tablet TAKE 1  TABLET(750 MG) BY MOUTH DAILY WITH BREAKFAST Patient taking differently: Take 750 mg by mouth daily with breakfast. 08/22/21   Burchette, Alinda Sierras, MD  metroNIDAZOLE (FLAGYL) 500 MG tablet Take 1 tablet (500 mg total) by mouth 2 (two) times daily. Patient not taking: Reported on 11/09/2021 10/19/21   Yetta Flock, MD  ondansetron (ZOFRAN) 8 MG tablet Take 1 tablet (8 mg total) by mouth every 8 (eight) hours as needed for nausea or vomiting. Start 72 hours after each IV chemotherapy administration 10/17/21   [name], MD  pantoprazole (PROTONIX) 20 MG tablet TAKE 1 TABLET(20 MG) BY MOUTH DAILY Patient not taking: Reported on 10/13/2021 09/30/21   [name], NP  prochlorperazine (COMPAZINE) 10 MG tablet Take 1 tablet (10 mg total) by mouth every 6 (six) hours as needed for nausea. 10/17/21   [name], MD  silodosin (RAPAFLO) 8 MG CAPS capsule Take 8 mg by mouth at bedtime.    [provider]  traMADol (ULTRAM) 50 MG tablet Take 1 tablet (50 mg total) by mouth every 6 (six) hours as needed for moderate pain. 11/02/21   [name], MD  TRAVATAN Z 0.004 % SOLN ophthalmic solution Place 1 drop into both eyes at bedtime. 11/12/16   [provider]    Family History Family History  Problem Relation Age of Onset   Hypertension Mother    Coronary artery disease Mother    Stroke Father 68   Stroke Sister    Pancreatic cancer Brother    Diabetes Maternal Aunt    Coronary artery disease Maternal Uncle        2 Maternal Uncles    Diabetes Maternal Uncle    Breast cancer Paternal Aunt    Coronary artery disease Paternal Aunt    Coronary artery disease Maternal Grandmother    Stroke Paternal Grandmother    Heart disease Neg Hx    Colon cancer Neg Hx    Esophageal cancer Neg Hx    Rectal cancer Neg Hx    Stomach cancer Neg Hx     Social History Social History   Tobacco Use   Smoking status: Former    Packs/day: 0.50    Years: 10.00    Pack  years: 5.00    Types: Cigarettes    Quit date: 11/13/1966    Years since quitting: 55.0   Smokeless tobacco: Never  Vaping Use   Vaping Use: Never used  Substance Use Topics   Alcohol use: Not Currently    Alcohol/week: 2.0 standard drinks    Types: 2 Glasses of wine per week    Comment: Occasional wine or beer   Drug use: No     Allergies   Patient has no known allergies.   Review of Systems Review of Systems  Constitutional:  Negative for activity change, appetite change, fatigue and fever.  Respiratory:  Negative for cough and shortness of breath.   Cardiovascular:  Negative for chest pain.  Gastrointestinal:  Negative for abdominal pain, diarrhea, nausea and vomiting.  Endocrine: Negative for polydipsia, polyphagia and polyuria.  Neurological:  Negative for dizziness, weakness, light-headedness and headaches.    Physical Exam Triage Vital Signs ED Triage Vitals  Enc Vitals Group     BP 11/12/21 1151 122/86     Pulse Rate 11/12/21 1151 89     Resp 11/12/21 1151 18     Temp 11/12/21 1151 98.2 F (36.8 C)     Temp src --      SpO2 11/12/21 1151 97 %     Weight --      Height --      Head Circumference --      Peak Flow --      Pain Score 11/12/21 1149 0     Pain Loc --      Pain Edu? --      Excl. in [place]? --    No data found.  Updated Vital Signs BP 122/86    Pulse 89    Temp 98.2 F (36.8 C)    Resp 18    SpO2 97%   Visual Acuity Right Eye Distance:   Left Eye Distance:   Bilateral Distance:    Right Eye Near:   Left Eye Near:    Bilateral Near:     Physical Exam Vitals reviewed.  Constitutional:      General: He is awake.     Appearance: Normal appearance. He is well-developed. He is not ill-appearing.     Comments: Very pleasant male appears stated age no acute distress  HENT:     Head: Normocephalic and atraumatic.  Cardiovascular:     Rate and Rhythm: Normal rate and regular rhythm.     Heart sounds: Normal heart sounds, S1 normal and S2  normal. No murmur heard. Pulmonary:     Effort: Pulmonary effort is normal.     Breath sounds: Normal breath sounds. No stridor. No wheezing, rhonchi or rales.     Comments: Clear to auscultation bilaterally Abdominal:     General: Bowel sounds are normal.     Palpations: Abdomen is soft.     Tenderness: There is no abdominal tenderness.  Neurological:     Mental Status: He is alert.  Psychiatric:        Behavior: Behavior is cooperative.     UC Treatments / Results  Labs (all labs ordered are listed, but only abnormal results are displayed) Labs Reviewed  CBG MONITORING, ED - Abnormal; Notable for the following components:      Result Value   Glucose-Capillary 409 (*)    All other components within normal limits    EKG   Radiology No results found.  Procedures Procedures (including critical care time)  Medications Ordered in UC Medications - No data to display  Initial Impression / Assessment and Plan / UC Course  I have reviewed the triage vital signs and the nursing notes.  Pertinent labs & imaging results that were available during my care of the patient were reviewed by me and considered in my medical decision making (see chart for details).     Blood sugar was significantly elevated at 409 today.  Discussed the importance of starting medication regimen as prescribed by PCP.  Reviewed how to give injection and use pen with patient and wife and first injection was given in clinic without complication or difficulty.  Manual for use of Toujeo pen was printed from the Internet by nursing staff  and provided to caregiver for reference.  Discussed the importance of monitoring blood sugar at home and addressing medication as prescribed by PCP.  We discussed that there is significant danger with taking too much of this medication and encouraged patient to verify dose with wife prior to injecting.  Given persistently elevated blood sugar today recommended that he avoid  carbohydrates and drink plenty fluid.  Discussed alarm symptoms that warrant emergent evaluation.  Strict return precautions given to which he and wife expressed understanding.  Final Clinical Impressions(s) / UC Diagnoses   Final diagnoses:  Type 2 diabetes mellitus with hyperglycemia, with long-term current use of insulin (HCC)  Hyperglycemia     Discharge Instructions      Continue using long-acting insulin every night as we discussed.  Make sure that both you and your wife verify the dose because it can be dangerous if you take too much of this medication.  Monitor your blood sugar closely and increase the dose by 2 units (12 units next week) until blood sugars in the morning are around 120.  Make sure you are drinking plenty of fluid.  If you develop any worsening symptoms including confusion, lightheadedness, increased hunger, increased thirst, shortness of breath you need to go to the emergency room.  Follow-up with your PCP first thing next week for further evaluation and management.    ED Prescriptions   None    PDMP not reviewed this encounter.   Terrilee Croak, PA-C 11/12/21 1234

## 2021-11-12 NOTE — Discharge Instructions (Signed)
Continue using long-acting insulin every night as we discussed.  Make sure that both you and your wife verify the dose because it can be dangerous if you take too much of this medication.  Monitor your blood sugar closely and increase the dose by 2 units (12 units next week) until blood sugars in the morning are around 120.  Make sure you are drinking plenty of fluid.  If you develop any worsening symptoms including confusion, lightheadedness, increased hunger, increased thirst, shortness of breath you need to go to the emergency room.  Follow-up with your PCP first thing next week for further evaluation and management.

## 2021-11-12 NOTE — ED Notes (Signed)
PA Erin aware Pt and wife have instructions on how to use insulin pen . Junie Panning ,PA updated on CBG results

## 2021-11-12 NOTE — ED Notes (Signed)
This Probation officer printed instructions on use of the specific  insulin pen pt has.  This was given to wife.

## 2021-11-12 NOTE — ED Triage Notes (Signed)
Pt new to using insuline. Pt and wife had a telephone  visit with PCP and were not provided instructions on how to give insuline shots. Pt has Toujeo Max in a insulin pen. Dose once a day. Pt is taking PO diabetic meds once a day. PT and Family need instruction on how to use insuline pen.

## 2021-11-13 ENCOUNTER — Encounter: Payer: Self-pay | Admitting: Oncology

## 2021-11-14 ENCOUNTER — Other Ambulatory Visit: Payer: Self-pay | Admitting: Oncology

## 2021-11-15 ENCOUNTER — Ambulatory Visit: Payer: Medicare HMO

## 2021-11-15 ENCOUNTER — Telehealth: Payer: Self-pay

## 2021-11-15 NOTE — Telephone Encounter (Signed)
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 She would like to know if he is to continue the metformin as well.

## 2021-11-15 NOTE — Telephone Encounter (Signed)
Pt & wife in office with questions about how to manage recent hyperglycemia since starting chemo. Pt had VV with PCP on 11/09/21 with plan of: -We discussed addition of insulin.  We will try Toujeo starting at 10 units once daily.  We did discuss titration up 2 units every few days until fasting blood sugars consistently 140 or less.  Could add short acting insulin but will try for simplicity to see if we can get reasonable control with once daily which will minimize hypoglycemia risk.  Pt started taking Toujeo on 11/12/21 b/c needed instructions on how to use pen. Since 11/10/21-11/14/21 fasting blood sugars have been:  327 321 409 375 327  Pt & wife concerned b/c blood sugars have been consistently elevated. Discussed 12/28 instructions with patient & wife. Both states "fasting" blood sugars are difficult for pt due to him snacking on Glucerna at 3am. They want to be sure they are increasing insulin appropriately. Discussed other options for appropriate snacks as well.

## 2021-11-16 ENCOUNTER — Encounter: Payer: Self-pay | Admitting: *Deleted

## 2021-11-16 ENCOUNTER — Encounter: Payer: Self-pay | Admitting: Oncology

## 2021-11-16 ENCOUNTER — Inpatient Hospital Stay: Payer: Medicare HMO

## 2021-11-16 ENCOUNTER — Other Ambulatory Visit: Payer: Self-pay

## 2021-11-16 ENCOUNTER — Inpatient Hospital Stay (HOSPITAL_BASED_OUTPATIENT_CLINIC_OR_DEPARTMENT_OTHER): Payer: Medicare HMO | Admitting: Oncology

## 2021-11-16 ENCOUNTER — Telehealth: Payer: Self-pay

## 2021-11-16 VITALS — BP 130/94 | HR 80 | Temp 98.1°F | Resp 18 | Ht 70.0 in | Wt 178.6 lb

## 2021-11-16 DIAGNOSIS — R4182 Altered mental status, unspecified: Secondary | ICD-10-CM | POA: Insufficient documentation

## 2021-11-16 DIAGNOSIS — R2681 Unsteadiness on feet: Secondary | ICD-10-CM | POA: Diagnosis not present

## 2021-11-16 DIAGNOSIS — D696 Thrombocytopenia, unspecified: Secondary | ICD-10-CM | POA: Insufficient documentation

## 2021-11-16 DIAGNOSIS — Z5111 Encounter for antineoplastic chemotherapy: Secondary | ICD-10-CM | POA: Insufficient documentation

## 2021-11-16 DIAGNOSIS — N189 Chronic kidney disease, unspecified: Secondary | ICD-10-CM | POA: Insufficient documentation

## 2021-11-16 DIAGNOSIS — C801 Malignant (primary) neoplasm, unspecified: Secondary | ICD-10-CM | POA: Insufficient documentation

## 2021-11-16 DIAGNOSIS — Z452 Encounter for adjustment and management of vascular access device: Secondary | ICD-10-CM | POA: Insufficient documentation

## 2021-11-16 DIAGNOSIS — I10 Essential (primary) hypertension: Secondary | ICD-10-CM | POA: Insufficient documentation

## 2021-11-16 DIAGNOSIS — R293 Abnormal posture: Secondary | ICD-10-CM | POA: Insufficient documentation

## 2021-11-16 DIAGNOSIS — C787 Secondary malignant neoplasm of liver and intrahepatic bile duct: Secondary | ICD-10-CM | POA: Insufficient documentation

## 2021-11-16 DIAGNOSIS — R2689 Other abnormalities of gait and mobility: Secondary | ICD-10-CM | POA: Diagnosis not present

## 2021-11-16 DIAGNOSIS — M6281 Muscle weakness (generalized): Secondary | ICD-10-CM | POA: Insufficient documentation

## 2021-11-16 LAB — CMP (CANCER CENTER ONLY)
ALT: 24 U/L (ref 0–44)
AST: 24 U/L (ref 15–41)
Albumin: 3.6 g/dL (ref 3.5–5.0)
Alkaline Phosphatase: 77 U/L (ref 38–126)
Anion gap: 8 (ref 5–15)
BUN: 24 mg/dL — ABNORMAL HIGH (ref 8–23)
CO2: 28 mmol/L (ref 22–32)
Calcium: 9.3 mg/dL (ref 8.9–10.3)
Chloride: 101 mmol/L (ref 98–111)
Creatinine: 1.09 mg/dL (ref 0.61–1.24)
GFR, Estimated: >60 mL/min (ref >60)
Glucose, Bld: 365 mg/dL — ABNORMAL HIGH (ref 70–99)
Potassium: 4.2 mmol/L (ref 3.5–5.1)
Sodium: 137 mmol/L (ref 135–145)
Total Bilirubin: 0.8 mg/dL (ref 0.3–1.2)
Total Protein: 6.4 g/dL — ABNORMAL LOW (ref 6.5–8.1)

## 2021-11-16 LAB — CBC WITH DIFFERENTIAL (CANCER CENTER ONLY)
Abs Immature Granulocytes: 0.01 10*3/uL (ref 0.00–0.07)
Basophils Absolute: 0 10*3/uL (ref 0.0–0.1)
Basophils Relative: 1 %
Eosinophils Absolute: 0.1 10*3/uL (ref 0.0–0.5)
Eosinophils Relative: 2 %
HCT: 36.9 % — ABNORMAL LOW (ref 39.0–52.0)
Hemoglobin: 12.2 g/dL — ABNORMAL LOW (ref 13.0–17.0)
Immature Granulocytes: 0 %
Lymphocytes Relative: 29 %
Lymphs Abs: 1.1 10*3/uL (ref 0.7–4.0)
MCH: 28 pg (ref 26.0–34.0)
MCHC: 33.1 g/dL (ref 30.0–36.0)
MCV: 84.6 fL (ref 80.0–100.0)
Monocytes Absolute: 0.4 10*3/uL (ref 0.1–1.0)
Monocytes Relative: 10 %
Neutro Abs: 2.3 10*3/uL (ref 1.7–7.7)
Neutrophils Relative %: 58 %
Platelet Count: 102 10*3/uL — ABNORMAL LOW (ref 150–400)
RBC: 4.36 MIL/uL (ref 4.22–5.81)
RDW: 14.2 % (ref 11.5–15.5)
WBC Count: 3.9 10*3/uL — ABNORMAL LOW (ref 4.0–10.5)
nRBC: 0 % (ref 0.0–0.2)

## 2021-11-16 MED ORDER — OXALIPLATIN CHEMO INJECTION 100 MG/20ML
65.0000 mg/m2 | Freq: Once | INTRAVENOUS | Status: AC
  Administered 2021-11-16: 130 mg via INTRAVENOUS
  Filled 2021-11-16: qty 10

## 2021-11-16 MED ORDER — SODIUM CHLORIDE 0.9 % IV SOLN
150.0000 mg | Freq: Once | INTRAVENOUS | Status: AC
  Administered 2021-11-16: 150 mg via INTRAVENOUS
  Filled 2021-11-16: qty 5

## 2021-11-16 MED ORDER — LEUCOVORIN CALCIUM INJECTION 350 MG
400.0000 mg/m2 | Freq: Once | INTRAVENOUS | Status: AC
  Administered 2021-11-16: 796 mg via INTRAVENOUS
  Filled 2021-11-16: qty 39.8

## 2021-11-16 MED ORDER — SODIUM CHLORIDE 0.9 % IV SOLN
10.0000 mg | Freq: Once | INTRAVENOUS | Status: AC
  Administered 2021-11-16: 10 mg via INTRAVENOUS
  Filled 2021-11-16: qty 1

## 2021-11-16 MED ORDER — SODIUM CHLORIDE 0.9 % IV SOLN
2000.0000 mg/m2 | INTRAVENOUS | Status: DC
  Administered 2021-11-16: 4000 mg via INTRAVENOUS
  Filled 2021-11-16: qty 80

## 2021-11-16 MED ORDER — FLUOROURACIL CHEMO INJECTION 2.5 GM/50ML
400.0000 mg/m2 | Freq: Once | INTRAVENOUS | Status: AC
  Administered 2021-11-16: 800 mg via INTRAVENOUS
  Filled 2021-11-16: qty 16

## 2021-11-16 MED ORDER — SODIUM CHLORIDE 0.9% FLUSH
10.0000 mL | INTRAVENOUS | Status: DC | PRN
  Administered 2021-11-16: 10 mL

## 2021-11-16 MED ORDER — PALONOSETRON HCL INJECTION 0.25 MG/5ML
0.2500 mg | Freq: Once | INTRAVENOUS | Status: AC
  Administered 2021-11-16: 0.25 mg via INTRAVENOUS
  Filled 2021-11-16: qty 5

## 2021-11-16 MED ORDER — DEXTROSE 5 % IV SOLN: Freq: Once | INTRAVENOUS | Status: AC

## 2021-11-16 NOTE — Progress Notes (Signed)
Patient presents for treatment. RN assessment completed along with the following:  Labs/vitals reviewed - Yes, and within treatment parameters.   Weight within 10% of previous measurement - Yes Oncology Treatment Attestation completed for current therapy- Yes, on date 10/11/2021. Informed consent completed and reflects current therapy/intent - Yes, on date 10/19/2021.             Provider progress note reviewed - Yes, today's provider note was reviewed. Treatment/Antibody/Supportive plan reviewed - Yes, and there are no adjustments needed for today's treatment. S&H and other orders reviewed - Yes, and there are no additional orders identified. Previous treatment date reviewed - Yes, and the appropriate amount of time has elapsed between treatments. Clinic Hand Off Received from - [name].   Patient to proceed with treatment.

## 2021-11-16 NOTE — Telephone Encounter (Signed)
Patient seen by Dr. Sherrill today ? ?Vitals are within treatment parameters. ? ?Labs reviewed by Dr. Sherrill and are within treatment parameters. ? ?Per physician team, patient is ready for treatment and there are NO modifications to the treatment plan.  ?

## 2021-11-16 NOTE — Progress Notes (Signed)
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  No nausea/vomiting, mouth sores, or diarrhea.  He had cold sensitivity following chemotherapy.  No neuropathy symptoms at present.  The postprandial abdominal pain resolved approximately a week ago. He is here today with his wife.  She reports he has been intermittently confused for the past few weeks.  He has difficulty checking his blood sugar and understanding which light switch to use for the bathroom.  He acknowledges confusion.  No headache, seizure, or focal neurologic symptoms.  No fall. He saw Dr. Fanny Skates on 11/08/2021.  She agrees with the current treatment regimen.  He reports a good appetite.  He is not taking pain medication. Objective:  Vital signs in last 24 hours:  Blood pressure (!) 130/94, pulse 80, temperature 98.1 F (36.7 C), temperature source Oral, resp. rate 18, height 5' 10"  (1.778 m), weight 176 lb 3.2 oz (79.9 kg), SpO2 98 %.    HEENT: No thrush or ulcers Resp: Lungs clear bilaterally Cardio: Regular rate and rhythm GI: No hepatosplenomegaly, mild tenderness in the right upper abdomen Vascular: No leg edema Neuro: Alert.  Oriented to place and year.  Follows commands.  The motor exam appears intact in the upper and lower extremities bilaterally Musculoskeletal 1.5 cm raised area at the left parietal skull  Portacath/PICC-without erythema  Lab Results:  Lab Results  Component Value Date   WBC 3.6 (L) 11/02/2021   HGB 12.2 (L) 11/02/2021   HCT 36.5 (L) 11/02/2021   MCV 83.0 11/02/2021   PLT 145 (L) 11/02/2021   NEUTROABS 2.4 11/02/2021    CMP  Lab Results  Component Value Date   NA 130 (L) 11/02/2021   K 4.5 11/02/2021   CL 97 (L) 11/02/2021   CO2 28 11/02/2021   GLUCOSE 344 (H) 11/02/2021   BUN 15 11/02/2021   CREATININE 1.13 11/02/2021   CALCIUM 8.8 (L) 11/02/2021    PROT 6.5 11/02/2021   ALBUMIN 3.5 11/02/2021   AST 40 11/02/2021   ALT 30 11/02/2021   ALKPHOS 85 11/02/2021   BILITOT 0.7 11/02/2021   GFRNONAA >60 11/02/2021   GFRAA 95 06/15/2008    Lab Results  Component Value Date   CEA 33.79 (H) 09/29/2021   ZOX096 81 (H) 09/29/2021    Medications: I have reviewed the patient's current medications.   Assessment/Plan: Poorly differentiated adenocarcinoma involving the liver Abdominal ultrasound 08/05/2021-indeterminate 78m solid mass in the right hepatic lobe.   MRI of the liver 09/06/2021-multifocal rim-enhancing lesions throughout both lobes of the liver.  Index lesion within the lateral dome of right hepatic lobe measures 1.1 cm, segment 4A lesion measures 1.6 x 1.4 cm, segment 2 lesion measures 0.8 x 0.7 cm, posteromedial margin of the right hepatic lobe subcapsular lesion measures 2.5 x 2.0 cm 09/16/2021 CT angio neck-9 mm right upper lobe nodule Biopsy of a right liver mass on 09/23/2021.  Pathology shows poorly differentiated adenocarcinoma positive for cytokeratin 7, CDX2 and cytokeratin 20 and negative for TTF-1, PSA and prostein.  Differential diagnoses include pancreatobiliary and less likely upper GI. HER2 positive by FISH PD-L1 combined positive score 0% MSS equivocal, tumor mutation burden 0, ERBB2 amplification equivocal 09/29/2021 CA 19-9 81, CEA 33 PET scan 10/10/2021-solitary 10 mm right upper lobe pulmonary nodule with minimal FDG uptake.  7.5 mm left internal mammary lymph node hypermetabolic with SUV max 40.45  Numerous hepatic lesions.  Periportal lymphadenopathy SUV max 8.18.  2 adjacent lesions noted in the mesentery in the right mid abdomen with somewhat irregular margins, SUV 8.09.  No hypermetabolic colonic lesion identified to suggest a primary colon cancer.  No pancreatic lesion.  No gastric lesion.  No retroperitoneal lymphadenopathy. Upper endoscopy 10/13/2021-no mass, H. pylori gastritis on gastric biopsy Cycle 1  FOLFOX 10/19/2021 Cycle 2 FOLFOX 11/02/2021, Emend added, oxaliplatin dose reduced Cycle 3 FOLFOX 11/16/2021 Hospital admission 09/16/2021 - 09/19/2021 with gait disturbance-multifocal embolic stroke on MRI 45/05/3343, echo with interatrial shunt, transcranial Doppler with bubble study indicative of a medium size right to left shunt, lower extremity Doppler studies negative for DVT Hospital admission 09/20/2021 - 09/22/2021 with chest pain-CT coronary with no acute findings.   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  He has tolerated the chemotherapy well to date.  The postprandial abdominal pain has resolved.  The plan is to proceed with cycle 3 FOLFOX today. He has lost weight over the past several weeks.  I adjusted the chemotherapy dose to his current weight.  At the etiology of the altered mental status is unclear.  I cannot relate confusion to his medical regimen or chemistry panel.  He will be referred for a brain CT.  He will return for an office visit and cycle 4 chemotherapy in 2 weeks.  We will plan for a restaging CT after cycle 4.    [name], MD  11/16/2021  10:45 AM

## 2021-11-16 NOTE — Patient Instructions (Signed)
[facility]  Discharge Instructions: Thank you for choosing [facility] to provide your oncology and hematology care.   If you have a lab appointment with the [facility], please go directly to the [facility] and check in at the registration area.   Wear comfortable clothing and clothing appropriate for easy access to any Portacath or PICC line.   We strive to give you quality time with your provider. You may need to reschedule your appointment if you arrive late (15 or more minutes).  Arriving late affects you and other patients whose appointments are after yours.  Also, if you miss three or more appointments without notifying the office, you may be dismissed from the clinic at the providers discretion.      For prescription refill requests, have your pharmacy contact our office and allow 72 hours for refills to be completed.    Today you received the following chemotherapy and/or immunotherapy agents oxaliplatin, leucovorin, and adruicil.    To help prevent nausea and vomiting after your treatment, we encourage you to take your nausea medication as directed.  BELOW ARE SYMPTOMS THAT SHOULD BE REPORTED IMMEDIATELY: *FEVER GREATER THAN 100.4 F (38 C) OR HIGHER *CHILLS OR SWEATING *NAUSEA AND VOMITING THAT IS NOT CONTROLLED WITH YOUR NAUSEA MEDICATION *UNUSUAL SHORTNESS OF BREATH *UNUSUAL BRUISING OR BLEEDING *URINARY PROBLEMS (pain or burning when urinating, or frequent urination) *BOWEL PROBLEMS (unusual diarrhea, constipation, pain near the anus) TENDERNESS IN MOUTH AND THROAT WITH OR WITHOUT PRESENCE OF ULCERS (sore throat, sores in mouth, or a toothache) UNUSUAL RASH, SWELLING OR PAIN  UNUSUAL VAGINAL DISCHARGE OR ITCHING   Items with * indicate a potential emergency and should be followed up as soon as possible or go to the Emergency Department if any problems should occur.  Please show the CHEMOTHERAPY ALERT CARD or IMMUNOTHERAPY  ALERT CARD at check-in to the Emergency Department and triage nurse.  Should you have questions after your visit or need to cancel or reschedule your appointment, please contact [facility]  Dept: [phone]  and follow the prompts.  Office hours are 8:00 a.m. to 4:30 p.m. Monday - Friday. Please note that voicemails left after 4:00 p.m. may not be returned until the following business day.  We are closed weekends and major holidays. You have access to a nurse at all times for urgent questions. Please call the main number to the clinic Dept: [phone] and follow the prompts.   For any non-urgent questions, you may also contact your provider using MyChart. We now offer e-Visits for anyone 2 and older to request care online for non-urgent symptoms. For details visit [email or web address].   Also download the MyChart app! Go to the app store, search "MyChart", open the app, select [facility], and log in with your MyChart username and password.  Due to Covid, a mask is required upon entering the hospital/clinic. If you do not have a mask, one will be given to you upon arrival. For doctor visits, patients may have 1 support person aged 52 or older with them. For treatment visits, patients cannot have anyone with them due to current Covid guidelines and our immunocompromised population.

## 2021-11-16 NOTE — Progress Notes (Signed)
PATIENT NAVIGATOR PROGRESS NOTE  Name: Robert Warner Date: 11/16/2021 MRN: [id number]  DOB: 06/14/1941   Reason for visit:  F/U visit with Dr Benay Spice  Comments:  Patient and wife describes having difficulty with checking blood glucose at home, difficulty with meter. His daughter will bring in meter this afternoon and will review with patient. 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 Difficulty sleeping, that he can go to sleep but cannot stay asleep for more than 5 hours. Awakens in middle of night and goes to living room.   Dr Benay Spice ordered Head CT for this Friday. It is scheduled for 1:30pm after portable pump d/c   Time spent counseling/coordinating care: 45-60 minutes

## 2021-11-17 ENCOUNTER — Encounter: Payer: Self-pay | Admitting: Family Medicine

## 2021-11-17 ENCOUNTER — Telehealth: Payer: Self-pay | Admitting: Pharmacist

## 2021-11-17 MED ORDER — FREESTYLE LITE TEST VI STRP: ORAL_STRIP | 1 refills | Status: DC

## 2021-11-17 NOTE — Chronic Care Management (AMB) (Signed)
Chronic Care Management Pharmacy Assistant   Name: Robert Warner  MRN: [id number] DOB: 1941/01/29  Reason for Encounter: Disease State / Diabetes Assessment   Conditions to be addressed/monitored: DMII  Recent office visits:  12/02/21 [name], MD - Patient presented for type 2 diabetes mellitus with hyperglycemia with long term current use of insulin and other concerns. Prescribed Lispro 5 units twice daily.  11/09/21 Burchette, Alinda Sierras, MD - Patient presented via video visit for poorly controlled type 2 diabetes. Prescribed Insulin Glargine 10 units. Changed Pantoprazole to 20 mg 1 time a day. Stopped Amoxicillin, Asprin and Clarithromycin  Recent consult visits:    11/30/21 [name], NP (Oncology) - Patient presented for Cancer with unknown primary site. Stopped Metronidazole 500 mg.  11/28/21 Frazier Butt, PT (Rehab) - Patient presented for muscle weakness and other concerns.  11/25/21 June Leap, PT (Rehab) - Patient presented for Unsteadiness on feet and other concerns.  11/18/21 Patients presented to [facility] for CT Head W/Wo Contrast  11/16/21 Patient presented  to [facility] for Oncology Infusion  11/16/21 [name], MD (Oncology) - Patient presented for Cancer treatment. No medication changes.   11/10/21 Bynum Bellows (Oncology) - Patient presented for cancer treatments, No medication changes.  11/03/21 Janene Harvey  (Rehab) - Patient presented for Unsteadiness on feet and other concerns. No medication changes  11/02/21 [name], MD (Oncology) -  Patient presented for Cancer treatment. Changed Tramadol to 50 mg every 6 hours  10/19/21 [name], MD (Oncology) - Patient presented for Cancer treatment. No medication changes.   10/11/21 [name], NP (Oncology) - Patient presented for Adenocarcinoma determined by biopsy of liver and other concerns. Prescribed Tramadol 50 mg      Hospital visits:  Medication Reconciliation was completed by comparing discharge summary, patients EMR and Pharmacy list, and upon discussion with patient.  Patient presented to [facility] on 12/04/21 due to Acute embolic stroke.Patient was present for 5 days.  New?Medications Started at [facility] Discharge:?? -started  apixaban [name]) cefadroxil (DURICEF) Start taking on: December 10, 2021 insulin aspart (novoLOG) polyethylene glycol (MIRALAX / GLYCOLAX) senna-docusate (Senokot-S  Medication Changes at Hospital Discharge: -Changed  Toujeo Max SoloStar  Medications Discontinued at Hospital Discharge: -Stopped  clopidogrel 75 MG tablet (PLAVIX) HumaLOG KwikPen 200 UNIT/ML KwikPen (insulin lispro)  Medications that remain the same after Hospital Discharge:??  -All other medications will remain the same.       Medication Reconciliation was completed by comparing discharge summary, patients EMR and Pharmacy list, and upon discussion with patient.  Patient presented to [facility] on 11/12/21 due to Type 2 diabetes with hyperglycemia with long- term current use of insulin.Patient was present for 1 hour.  New?Medications Started at [facility] Discharge:?? -started  None  Medication Changes at Hospital Discharge: -Changed  None  Medications Discontinued at Hospital Discharge: -Stopped  None  Medications that remain the same after Hospital Discharge:??  -All other medications will remain the same.    Medications: Outpatient Encounter Medications as of 11/17/2021  Medication Sig   acetaminophen (TYLENOL) 650 MG CR tablet Take 650 mg by mouth every 8 (eight) hours as needed for pain.   atorvastatin (LIPITOR) 40 MG tablet Take 1 tablet (40 mg total) by mouth daily.   Cholecalciferol (VITAMIN D3) 2000 UNITS TABS Take 2,000 Units by mouth daily.   clopidogrel (PLAVIX) 75 MG tablet Take 1  tablet (75 mg total) by mouth daily.  Start after conforming with IR Doctor after biopsy (Patient not taking: Reported on 11/09/2021)   COVID-19 mRNA bivalent vaccine, Pfizer, (PFIZER COVID-19 VAC BIVALENT) injection Inject into the muscle. (Patient not taking: Reported on 10/19/2021)   glucose blood (FREESTYLE LITE) test strip CHECK BLOOD SUGAR ONCE DAILY.  Dx:E11.9 (Patient not taking: Reported on 10/19/2021)   insulin glargine, 2 Unit Dial, (TOUJEO MAX SOLOSTAR) 300 UNIT/ML Solostar Pen Inject 10 Units into the skin daily.   Lancets (FREESTYLE) lancets Check blood sugar once daily (Patient not taking: Reported on 10/19/2021)   lidocaine-prilocaine (EMLA) cream Apply 1 application topically as needed. Apply 1/2 tablespoon to port site 2 hours prior to stick and cover with Press-and-Seal to numb site for access   metFORMIN (GLUCOPHAGE-XR) 750 MG 24 hr tablet TAKE 1 TABLET(750 MG) BY MOUTH DAILY WITH BREAKFAST (Patient taking differently: Take 750 mg by mouth daily with breakfast.)   metroNIDAZOLE (FLAGYL) 500 MG tablet Take 1 tablet (500 mg total) by mouth 2 (two) times daily. (Patient not taking: Reported on 11/09/2021)   ondansetron (ZOFRAN) 8 MG tablet Take 1 tablet (8 mg total) by mouth every 8 (eight) hours as needed for nausea or vomiting. Start 72 hours after each IV chemotherapy administration   pantoprazole (PROTONIX) 20 MG tablet TAKE 1 TABLET(20 MG) BY MOUTH DAILY (Patient not taking: Reported on 10/13/2021)   prochlorperazine (COMPAZINE) 10 MG tablet Take 1 tablet (10 mg total) by mouth every 6 (six) hours as needed for nausea.   silodosin (RAPAFLO) 8 MG CAPS capsule Take 8 mg by mouth at bedtime.   traMADol (ULTRAM) 50 MG tablet Take 1 tablet (50 mg total) by mouth every 6 (six) hours as needed for moderate pain.   TRAVATAN Z 0.004 % SOLN ophthalmic solution Place 1 drop into both eyes at bedtime.   No facility-administered encounter medications on file as of 11/17/2021.  Recent Relevant Labs: Lab Results  Component Value  Date/Time   HGBA1C 6.9 (H) 09/17/2021 05:00 AM   HGBA1C 6.9 (A) 08/29/2021 08:38 AM   HGBA1C 6.3 (A) 05/04/2021 07:27 AM   HGBA1C 7.4 (H) 01/28/2018 09:13 AM   HGBA1C 7.4 07/04/2017 12:00 AM   MICROALBUR <0.7 01/31/2021 01:41 PM   MICROALBUR <0.7 07/07/2019 09:09 AM    Kidney Function Lab Results  Component Value Date/Time   CREATININE 1.09 11/16/2021 09:54 AM   CREATININE 1.13 11/02/2021 08:19 AM   GFR 54.73 (L) 09/13/2021 07:19 AM   GFRNONAA >60 11/16/2021 09:54 AM   GFRAA 95 06/15/2008 12:00 AM   11/21/21 Call from patients wife she reports they have not been checking as his ins is no longer covering the freestyle supplies, is covering accu -check and they do not have that meter, message sent to Pharmacist to ask if prescription for that meter can be sent over to the pharmacy.  11/23/21 Current antihyperglycemic regimen:  Metformin 750 mg XR 1 tablet daily with breakfast What recent interventions/DTPs have been made to improve glycemic control:  Wife reports other than Chemo no new changes Have there been any recent hospitalizations or ED visits since last visit with CPP? No Patient denies hypoglycemic symptoms, including None Patient denies hyperglycemic symptoms, including none How often are you checking your blood sugar? before breakfast What are your blood sugars ranging?  Fasting: 222 on today Wife reports they have increased his medication to 12 on yesterday and will continue to increase for several days per provider instructions. She reports  he was in the 300's with his sugars but they are slowly coming down.  During the week, how often does your blood glucose drop below 70? Never   Adherence Review: Is the patient currently on a STATIN medication? Yes Is the patient currently on ACE/ARB medication? No Does the patient have >5 day gap between last estimated fill dates? No  Notes: Wife reports that only 9 lancets were in the box with the meter and they will be needing  more, Advised her there was a prescription sent over to [facility] on yesterday for a box of 100 lancets that they should be able to pick up. She advised she will do so. Also mentioned to her that I would check back in a few weeks to see how his sugars are trending and she was in agreement and would call me if anything happens prior.  12/13/21 Per chart notes patient is in SNF as of today  Care Gaps: BP - 132/82 (09/29/21) Zoster Vaccines - Overdue TDAP - Overdue Foot Exam - Overdue COVID Booster - Overdue AWV-9/22 Lab Results  Component Value Date   HGBA1C 6.9 (H) 09/17/2021    Star Rating Drugs: Atorvastatin (Lipitor) 40 mg - Last filled 10/19/21 90 DS at [facility] Metformin 750 mg - Last filled 08/31/21 90 DS at [facility] Pravastatin 40 mg - Last filled 09/21/21 30 DS at [facility] Rybelsus 7 mg - Last filled 08/31/21 90 DS at [facility] Pharmacist Assistant [phone]

## 2021-11-17 NOTE — Addendum Note (Signed)
Addended by: Otilio Miu on: 11/17/2021 04:51 PM   Modules accepted: Orders

## 2021-11-17 NOTE — Telephone Encounter (Signed)
Patient sent my chart message concerning test strips.    Reply message concerning Metformin was sent to patient via my chart.    Message complete.

## 2021-11-18 ENCOUNTER — Ambulatory Visit (HOSPITAL_BASED_OUTPATIENT_CLINIC_OR_DEPARTMENT_OTHER)
Admission: RE | Admit: 2021-11-18 | Discharge: 2021-11-18 | Disposition: A | Discharge status: Home / Self Care | Payer: Medicare HMO | Source: Ambulatory Visit | Attending: Oncology | Admitting: Oncology

## 2021-11-18 ENCOUNTER — Telehealth: Payer: Self-pay

## 2021-11-18 ENCOUNTER — Other Ambulatory Visit: Payer: Self-pay

## 2021-11-18 ENCOUNTER — Inpatient Hospital Stay: Payer: Medicare HMO

## 2021-11-18 ENCOUNTER — Ambulatory Visit: Payer: Medicare HMO | Admitting: Physical Therapy

## 2021-11-18 VITALS — BP 120/88 | HR 79 | Temp 98.5°F | Resp 20

## 2021-11-18 DIAGNOSIS — R2681 Unsteadiness on feet: Secondary | ICD-10-CM | POA: Diagnosis not present

## 2021-11-18 DIAGNOSIS — R519 Headache, unspecified: Secondary | ICD-10-CM | POA: Diagnosis not present

## 2021-11-18 DIAGNOSIS — M6281 Muscle weakness (generalized): Secondary | ICD-10-CM | POA: Diagnosis not present

## 2021-11-18 DIAGNOSIS — R2689 Other abnormalities of gait and mobility: Secondary | ICD-10-CM | POA: Diagnosis not present

## 2021-11-18 DIAGNOSIS — R293 Abnormal posture: Secondary | ICD-10-CM | POA: Diagnosis not present

## 2021-11-18 DIAGNOSIS — C801 Malignant (primary) neoplasm, unspecified: Secondary | ICD-10-CM

## 2021-11-18 MED ORDER — IOHEXOL 300 MG/ML  SOLN
100.0000 mL | Freq: Once | INTRAMUSCULAR | Status: AC | PRN
  Administered 2021-11-18: 75 mL via INTRAVENOUS

## 2021-11-18 MED ORDER — HEPARIN SOD (PORK) LOCK FLUSH 100 UNIT/ML IV SOLN
500.0000 [IU] | Freq: Once | INTRAVENOUS | Status: AC | PRN
  Administered 2021-11-18: 500 [IU]

## 2021-11-18 MED ORDER — SODIUM CHLORIDE 0.9% FLUSH
10.0000 mL | INTRAVENOUS | Status: DC | PRN
  Administered 2021-11-18: 10 mL

## 2021-11-18 NOTE — Telephone Encounter (Signed)
-----   Message from Hughie Closs, RN sent at 10/20/2021  8:23 AM EST ----- Call pt. And remind him to HOLD his Protonix for 2 weeks on 11/20/21, for stool test on 12/04/21

## 2021-11-18 NOTE — Telephone Encounter (Signed)
Lm on patient's home vm for him to return call.

## 2021-11-19 ENCOUNTER — Other Ambulatory Visit: Payer: Self-pay | Admitting: Family Medicine

## 2021-11-21 ENCOUNTER — Other Ambulatory Visit: Payer: Self-pay

## 2021-11-21 DIAGNOSIS — R3914 Feeling of incomplete bladder emptying: Secondary | ICD-10-CM | POA: Diagnosis not present

## 2021-11-21 DIAGNOSIS — N401 Enlarged prostate with lower urinary tract symptoms: Secondary | ICD-10-CM | POA: Diagnosis not present

## 2021-11-21 MED ORDER — ACCU-CHEK SOFTCLIX LANCETS MISC: 12 refills | Status: DC

## 2021-11-21 MED ORDER — GLUCOSE BLOOD VI STRP: ORAL_STRIP | 12 refills | Status: AC

## 2021-11-21 MED ORDER — ACCU-CHEK AVIVA PLUS W/DEVICE KIT: PACK | 0 refills | Status: DC

## 2021-11-21 NOTE — Telephone Encounter (Signed)
Pt's wife returned call. She is aware that patient needs to begin holding Protonix today. They will come by on 12/05/21 to pick up H. Pylori stool kit. Pt's wife knows that after pt leaves his stool sample he can resume Protonix. Pt's wife verbalized understanding and had no concerns at the end of the call.

## 2021-11-22 ENCOUNTER — Other Ambulatory Visit: Payer: Self-pay

## 2021-11-22 MED ORDER — ACCU-CHEK SOFTCLIX LANCETS MISC: 12 refills | Status: AC

## 2021-11-25 ENCOUNTER — Encounter: Payer: Self-pay | Admitting: Physical Therapy

## 2021-11-25 ENCOUNTER — Other Ambulatory Visit: Payer: Self-pay

## 2021-11-25 ENCOUNTER — Ambulatory Visit: Payer: Medicare HMO | Attending: Internal Medicine | Admitting: Physical Therapy

## 2021-11-25 DIAGNOSIS — R2681 Unsteadiness on feet: Secondary | ICD-10-CM | POA: Diagnosis not present

## 2021-11-25 DIAGNOSIS — R293 Abnormal posture: Secondary | ICD-10-CM

## 2021-11-25 DIAGNOSIS — M6281 Muscle weakness (generalized): Secondary | ICD-10-CM

## 2021-11-25 DIAGNOSIS — R2689 Other abnormalities of gait and mobility: Secondary | ICD-10-CM

## 2021-11-25 NOTE — Therapy (Signed)
[facility] [address] [place], [place], [address] Phone: [phone]   Fax:  [phone]  Physical Therapy Re-Evaluation  Patient Details  Name: Robert Warner MRN: [id number] Date of Birth: December 03, 1940 Referring Provider (PT): Antonieta Pert, MD   Encounter Date: 11/25/2021   PT End of Session - 11/25/21 1201     Visit Number 8    Number of Visits 24    Date for PT Re-Evaluation 01/20/22    Authorization Type [employer]    Authorization Time Period 9 visits 11/18-12/30    Authorization - Visit Number 7    Authorization - Number of Visits 9    PT Start Time 6384    PT Stop Time 1150    PT Time Calculation (min) 48 min    Equipment Utilized During Treatment Gait belt    Activity Tolerance Patient tolerated treatment well    Behavior During Therapy WFL for tasks assessed/performed             Past Medical History:  Diagnosis Date   Arthritis    Cancer ([place])    Cataract    Diabetes mellitus    Hyperlipidemia    Hypertension    Stroke [facility])     Past Surgical History:  Procedure Laterality Date   APPENDECTOMY  1968   CATARACT EXTRACTION Bilateral 07/2000   states 2 weeks ago (first of sept) OD due in Dec    COLONOSCOPY  2004,2009   Negative, Dr. Sharlett Iles    IR IMAGING GUIDED PORT INSERTION  10/14/2021   KIDNEY STONE SURGERY  10/2018   PROSTATE BIOPSY  2006   Dr.Sigmund Tannebaum    There were no vitals filed for this visit.   Subjective Assessment - 11/25/21 1104     Subjective Confused about if he had another stroke or not. Was told that it's possible that he experienced another one. Continues exercising but note that he can't keep up with the days and has trouble with memory. Has been having trouble using his glucose meter- levels have been between 200-300 but not over 300. Feels that his balance has gotten worse.    Pertinent History DMT2, recent CVA, HTN, HLD    Diagnostic tests 11/18/21 head CT: New  "lesion" in the left cerebellum when compared to brain MRI  09/16/2021. Appearance and rapid development is suspicious for a  subacute infarct. Metastatic focus is not excluded, recommend follow-up brain  MRI with contrast in the next 4-6 weeks.09/16/21 brain MRI: Multiple small foci of restricted diffusion throughout the bilateral cerebral and cerebellar hemispheres, as well as the right thalamus,  consistent with acute infarcts. Additional subacute infarcts in the  vermis and left aspect of the splenium of the corpus callosum. Given  different vascular territories, an embolic etiology is favored.    Patient Stated Goals improve balance    Currently in Pain? No/denies                [facility] PT Assessment - 11/25/21 1111       Assessment   Medical Diagnosis Cerebrovascular accident (CVA) due to embolism of precerebral artery    Referring Provider (PT) Antonieta Pert, MD    Onset Date/Surgical Date 09/16/21      Coordination   Gross Motor Movements are Fluid and Coordinated No    Coordination and Movement Description alt pronation/supination WFL    Finger Nose Finger Test L hand marked dysmetria    Heel Shin Test intact  AROM   Right Ankle Dorsiflexion 4    Left Ankle Dorsiflexion 14      Strength   Overall Strength Comments in sitting    Right Hip Flexion 4/5    Right Hip ABduction 4+/5    Right Hip ADduction 4+/5    Left Hip Flexion 4/5    Left Hip ABduction 4+/5    Left Hip ADduction 4+/5    Right Knee Flexion 4+/5    Right Knee Extension 5/5    Left Knee Flexion 4+/5    Left Knee Extension 4/5    Right Ankle Dorsiflexion 3+/5    Right Ankle Plantar Flexion 4/5    Left Ankle Dorsiflexion 4+/5    Left Ankle Plantar Flexion 4/5      Timed Up and Go Test   Normal TUG (seconds) 12.87   no AD; LOB during turn with min A required     Functional Gait  Assessment   Gait assessed  Yes    Gait Level Surface Walks 20 ft in less than 5.5 sec, no assistive devices, good speed,  no evidence for imbalance, normal gait pattern, deviates no more than 6 in outside of the 12 in walkway width.    Change in Gait Speed Able to smoothly change walking speed without loss of balance or gait deviation. Deviate no more than 6 in outside of the 12 in walkway width.    Gait with Horizontal Head Turns Performs head turns smoothly with slight change in gait velocity (eg, minor disruption to smooth gait path), deviates 6-10 in outside 12 in walkway width, or uses an assistive device.    Gait with Vertical Head Turns Performs task with slight change in gait velocity (eg, minor disruption to smooth gait path), deviates 6 - 10 in outside 12 in walkway width or uses assistive device    Gait and Pivot Turn Turns slowly, requires verbal cueing, or requires several small steps to catch balance following turn and stop    Step Over Obstacle Is able to step over one shoe box (4.5 in total height) but must slow down and adjust steps to clear box safely. May require verbal cueing.    Gait with Narrow Base of Support Ambulates 7-9 steps.    Gait with Eyes Closed Walks 20 ft, slow speed, abnormal gait pattern, evidence for imbalance, deviates 10-15 in outside 12 in walkway width. Requires more than 9 sec to ambulate 20 ft.    Ambulating Backwards Walks 20 ft, uses assistive device, slower speed, mild gait deviations, deviates 6-10 in outside 12 in walkway width.    Steps Alternating feet, no rail.    Total Score 20      RLE Tone   RLE Tone Within Functional Limits      LLE Tone   LLE Tone Within Functional Limits                                    PT Education - 11/25/21 1200     Education Details update to HEP-  Access Code [email or web address]; discussion on decline in today's outcome measure and POC moving forward; encouraged patient to continue using AD for max safety    Person(s) Educated Patient    Methods Explanation;Demonstration;Tactile cues;Verbal cues;Handout     Comprehension Verbalized understanding;Returned demonstration              PT Short Term Goals - 11/25/21 1204  PT SHORT TERM GOAL #1   Title Patient to be independent with initial HEP.    Time 3    Period Weeks    Status Achieved    Target Date 10/21/21               PT Long Term Goals - 11/25/21 1204       PT LONG TERM GOAL #1   Title Patient to be independent with advanced HEP.    Time 8    Period Weeks    Status Partially Met    Target Date 01/20/22      PT LONG TERM GOAL #2   Title Patient to complete TUG in <14 sec with LRAD in order to decrease risk of falls.    Time 6    Period Weeks    Status Achieved      PT LONG TERM GOAL #3   Title Patient to score at least 23/30 on FGA in order to decrease risk of falls.    Time 8    Period Weeks    Status On-going   20/30   Target Date 01/20/22      PT LONG TERM GOAL #4   Title Patient to demonstrate B LE strength >/=4+/5.    Time 8    Period Weeks    Status Partially Met    Target Date 01/20/22      PT LONG TERM GOAL #5   Title Patient will ambulate over outdoor surfaces with LRAD while performing head turns to scan environment with good stability in order to indicate safe community mobility.    Time 8    Period Weeks    Status Partially Met   NT today d/t time   Target Date 01/20/22                   Plan - 11/25/21 1202     Clinical Impression Statement Patient returned to [facility] after trouble with medical comorbidities including hyperglycemia as well as new lesion seen in L cerebellum on brain CT on 11/18/21. Patient reports worsening balance and memory as a result. Denies falls and reports continued [facility] use with ambulation. Patient demonstrated decline in R ankle dorsiflexion ROM, B LE strength, balance, L hand coordination, and gait speed since last seen in therapy on 11/03/21. FGA score indicates an increased risk of falls. Re-administered updated HEP- patient reported understanding.  Would benefit from additional skilled PT services 2x/week for 8 weeks to address current impairments and remaining goals.    Comorbidities DMT2, recent CVA, HTN, HLD    PT Frequency 2x / week    PT Duration 8 weeks    PT Treatment/Interventions ADLs/Self Care Home Management;Canalith Repostioning;Cryotherapy;Electrical Stimulation;DME Instruction;Ultrasound;Moist Heat;Gait training;Stair training;Functional mobility training;Therapeutic activities;Therapeutic exercise;Balance training;Neuromuscular re-education;Manual techniques;Patient/family education;Passive range of motion;Dry needling;Energy conservation;Vestibular;Vasopneumatic Device;Taping    PT Next Visit Plan R ankle stretching and strengthening, standing balance activities including head turns, turning, narrow BOS    Consulted and Agree with Plan of Care Patient             Patient will benefit from skilled therapeutic intervention in order to improve the following deficits and impairments:  Abnormal gait, Decreased coordination, Decreased range of motion, Decreased safety awareness, Decreased activity tolerance, Decreased balance, Impaired flexibility, Improper body mechanics, Postural dysfunction, Decreased strength  Visit Diagnosis: Unsteadiness on feet  Muscle weakness (generalized)  Other abnormalities of gait and mobility  Abnormal posture     Problem List Patient Active Problem  List   Diagnosis Date Noted   Cancer with unknown primary site [facility]) 10/11/2021   Goals of care, counseling/discussion 10/11/2021   Chest pain 09/21/2021   Recent cerebrovascular accident (CVA) 09/21/2021   Interatrial cardiac shunt 09/21/2021   CKD (chronic kidney disease) stage 3, GFR 30-59 ml/min ([place]) 44/61/9012   Acute embolic stroke ([place]) 22/41/1464   Mixed diabetic hyperlipidemia associated with type 2 diabetes mellitus ([place]) 09/16/2021   Liver masses 09/16/2021   Essential hypertension 03/12/2012   Type 2 diabetes mellitus  without complication, without long-term current use of insulin ([place]) 06/15/2008   BPH (benign prostatic hyperplasia) 06/15/2008      Janene Harvey, PT, DPT 11/25/21 12:07 PM   [facility] [address], [place], [place], [address] Phone: [phone]   Fax:  [phone]  Name: Robert Warner MRN: [id number] Date of Birth: 1941-08-29

## 2021-11-27 ENCOUNTER — Other Ambulatory Visit: Payer: Self-pay | Admitting: Oncology

## 2021-11-28 ENCOUNTER — Encounter: Payer: Self-pay | Admitting: Oncology

## 2021-11-28 ENCOUNTER — Ambulatory Visit: Payer: Medicare HMO | Admitting: Physical Therapy

## 2021-11-28 ENCOUNTER — Other Ambulatory Visit: Payer: Self-pay

## 2021-11-28 DIAGNOSIS — R2681 Unsteadiness on feet: Secondary | ICD-10-CM | POA: Diagnosis not present

## 2021-11-28 DIAGNOSIS — M6281 Muscle weakness (generalized): Secondary | ICD-10-CM | POA: Diagnosis not present

## 2021-11-28 DIAGNOSIS — R293 Abnormal posture: Secondary | ICD-10-CM | POA: Diagnosis not present

## 2021-11-28 DIAGNOSIS — R2689 Other abnormalities of gait and mobility: Secondary | ICD-10-CM

## 2021-11-28 NOTE — Therapy (Signed)
[facility] [address], [place], [place], [address] Phone: [phone]   Fax:  [phone]  Physical Therapy Treatment  Patient Details  Name: Robert Warner MRN: [id number] Date of Birth: [birth year] Referring Provider (PT): Antonieta Pert, MD [name], PCP)   Encounter Date: 11/28/2021   PT End of Session - 11/28/21 0852     Visit Number 9    Number of Visits 24    Date for PT Re-Evaluation 01/20/22    Authorization Type [employer] (submitted again at Jan re-eval visit)-awaiting reauth    Authorization Time Period 9 visits 11/18-12/30    PT Start Time 0848    PT Stop Time 0930    PT Time Calculation (min) 42 min    Equipment Utilized During Treatment Gait belt    Activity Tolerance Patient tolerated treatment well    Behavior During Therapy [facility] for tasks assessed/performed             Past Medical History:  Diagnosis Date   Arthritis    Cancer ([employer])    Cataract    Diabetes mellitus    Hyperlipidemia    Hypertension    Stroke [facility])     Past Surgical History:  Procedure Laterality Date   APPENDECTOMY  1968   CATARACT EXTRACTION Bilateral 07/2000   states 2 weeks ago (first of sept) OD due in Dec    COLONOSCOPY  2004,2009   Negative, Dr. Sharlett Iles    IR IMAGING GUIDED PORT INSERTION  10/14/2021   KIDNEY STONE SURGERY  10/2018   PROSTATE BIOPSY  2006   Dr.Sigmund Tannebaum    There were no vitals filed for this visit.   Subjective Assessment - 11/28/21 0849     Subjective No changes over the weekend.  Glucose under 200 this morning.  Been doing the exercises.  No falls.    Pertinent History DMT2, recent CVA, HTN, HLD    Diagnostic tests 11/18/21 head CT: New "lesion" in the left cerebellum when compared to brain MRI  09/16/2021. Appearance and rapid development is suspicious for a  subacute infarct. Metastatic focus is not excluded, recommend follow-up brain  MRI with contrast in the next 4-6  weeks.09/16/21 brain MRI: Multiple small foci of restricted diffusion throughout the bilateral cerebral and cerebellar hemispheres, as well as the right thalamus,  consistent with acute infarcts. Additional subacute infarcts in the  vermis and left aspect of the splenium of the corpus callosum. Given  different vascular territories, an embolic etiology is favored.    Patient Stated Goals improve balance    Currently in Pain? No/denies                               [facility] Adult PT Treatment/Exercise - 11/28/21 0001       Exercises   Exercises Knee/Hip;Ankle      Knee/Hip Exercises: Stretches   Gastroc Stretch Right;Left;3 reps;30 seconds    Gastroc Stretch Limitations Foot propped on 2" step, cues for technique      Knee/Hip Exercises: Aerobic   Nustep Level 4>5, 4 extremities x 6 minutes, steps/min > 100 throughout, as warm-up activity at beginning of session      Knee/Hip Exercises: Seated   Sit to Sand 3 sets;without UE support   5 reps, from mat height, solid ground, then on Airex, RLE tucked posteriorly.  Light UE support when standing on Airex  Ankle Exercises: Standing   Heel Raises Both;10 reps    Toe Raise 10 reps    Toe Raise Limitations BLEs; decreased ankle dorsiflexion RLE      Ankle Exercises: Seated   Heel Raises Right;10 reps;Limitations;Left   2 sets   Toe Raise 10 reps   2 sets, RLE   Toe Raise Limitations Review of HEP given last visit; initiated with green band, but too hard; went to use of red theraband    Other Seated Ankle Exercises Active ankle eversion, 2 sets x 10 reps, with initial assist to prevent hip external rotation                 Balance Exercises - 11/28/21 0001       Balance Exercises: Standing   Tandem Stance Eyes open;Upper extremity support 2;Limitations    Tandem Stance Time Head turns/headss x 10    Tandem Gait Forward;Retro;Limitations;3 reps    Tandem Gait Limitations progress to 2 reps of tandem march,  cues to lift RLE in marching position    Sidestepping Upper extremity support;3 reps;Theraband   red                 PT Short Term Goals - 11/25/21 1204       PT SHORT TERM GOAL #1   Title Patient to be independent with initial HEP.    Time 3    Period Weeks    Status Achieved    Target Date 10/21/21               PT Long Term Goals - 11/25/21 1204       PT LONG TERM GOAL #1   Title Patient to be independent with advanced HEP.    Time 8    Period Weeks    Status Partially Met    Target Date 01/20/22      PT LONG TERM GOAL #2   Title Patient to complete TUG in <14 sec with LRAD in order to decrease risk of falls.    Time 6    Period Weeks    Status Achieved      PT LONG TERM GOAL #3   Title Patient to score at least 23/30 on FGA in order to decrease risk of falls.    Time 8    Period Weeks    Status On-going   20/30   Target Date 01/20/22      PT LONG TERM GOAL #4   Title Patient to demonstrate B LE strength >/=4+/5.    Time 8    Period Weeks    Status Partially Met    Target Date 01/20/22      PT LONG TERM GOAL #5   Title Patient will ambulate over outdoor surfaces with LRAD while performing head turns to scan environment with good stability in order to indicate safe community mobility.    Time 8    Period Weeks    Status Partially Met   NT today d/t time   Target Date 01/20/22                   Plan - 11/28/21 0857     Clinical Impression Statement REview of ankle strengthening exercise given last visit, pt needing cues for correct technique.  Addressed ankle stretching, strengthening and balance activities in PT session.  Pt uses UE support through dynamic exercises in session.  He will continue to benefit from skilled PT towards goals for improved overall  functional mobility and decreased fall risk.    Comorbidities DMT2, recent CVA, HTN, HLD    PT Frequency 2x / week    PT Duration 8 weeks    PT Treatment/Interventions ADLs/Self  Care Home Management;Canalith Repostioning;Cryotherapy;Electrical Stimulation;DME Instruction;Ultrasound;Moist Heat;Gait training;Stair training;Functional mobility training;Therapeutic activities;Therapeutic exercise;Balance training;Neuromuscular re-education;Manual techniques;Patient/family education;Passive range of motion;Dry needling;Energy conservation;Vestibular;Vasopneumatic Device;Taping    PT Next Visit Plan 10th Visit Progress note; Continue R ankle stretching and strengthening, standing balance activities including head turns, turning, narrow BOS for balance    Consulted and Agree with Plan of Care Patient             Patient will benefit from skilled therapeutic intervention in order to improve the following deficits and impairments:  Abnormal gait, Decreased coordination, Decreased range of motion, Decreased safety awareness, Decreased activity tolerance, Decreased balance, Impaired flexibility, Improper body mechanics, Postural dysfunction, Decreased strength  Visit Diagnosis: Muscle weakness (generalized)  Unsteadiness on feet  Other abnormalities of gait and mobility     Problem List Patient Active Problem List   Diagnosis Date Noted   Cancer with unknown primary site ([place]) 10/11/2021   Goals of care, counseling/discussion 10/11/2021   Chest pain 09/21/2021   Recent cerebrovascular accident (CVA) 09/21/2021   Interatrial cardiac shunt 09/21/2021   CKD (chronic kidney disease) stage 3, GFR 30-59 ml/min ([place]) 16/74/2552   Acute embolic stroke ([place]) 58/94/8347   Mixed diabetic hyperlipidemia associated with type 2 diabetes mellitus ([place]) 09/16/2021   Liver masses 09/16/2021   Essential hypertension 03/12/2012   Type 2 diabetes mellitus without complication, without long-term current use of insulin ([place]) 06/15/2008   BPH (benign prostatic hyperplasia) 06/15/2008    Hetty Linhart W., PT 11/28/2021, 9:33 AM  [facility] [address] [place], [place], [address] Phone: [phone]   Fax:  [phone]  Name: Robert Warner MRN: [id number] Date of Birth: June 06, 1941

## 2021-11-30 ENCOUNTER — Encounter: Payer: Self-pay | Admitting: *Deleted

## 2021-11-30 ENCOUNTER — Inpatient Hospital Stay (HOSPITAL_BASED_OUTPATIENT_CLINIC_OR_DEPARTMENT_OTHER): Payer: Medicare HMO | Admitting: Nurse Practitioner

## 2021-11-30 ENCOUNTER — Encounter: Payer: Self-pay | Admitting: Oncology

## 2021-11-30 ENCOUNTER — Telehealth: Payer: Self-pay | Admitting: Nutrition

## 2021-11-30 ENCOUNTER — Inpatient Hospital Stay: Payer: Medicare HMO

## 2021-11-30 ENCOUNTER — Telehealth: Payer: Self-pay

## 2021-11-30 ENCOUNTER — Inpatient Hospital Stay: Payer: Medicare HMO | Admitting: Nutrition

## 2021-11-30 ENCOUNTER — Other Ambulatory Visit: Payer: Self-pay

## 2021-11-30 ENCOUNTER — Encounter: Payer: Self-pay | Admitting: Nurse Practitioner

## 2021-11-30 VITALS — BP 143/84 | HR 77

## 2021-11-30 VITALS — BP 107/73 | HR 62 | Temp 97.8°F | Resp 19 | Ht 70.0 in | Wt 176.2 lb

## 2021-11-30 DIAGNOSIS — R2689 Other abnormalities of gait and mobility: Secondary | ICD-10-CM | POA: Diagnosis not present

## 2021-11-30 DIAGNOSIS — R293 Abnormal posture: Secondary | ICD-10-CM | POA: Diagnosis not present

## 2021-11-30 DIAGNOSIS — C801 Malignant (primary) neoplasm, unspecified: Secondary | ICD-10-CM

## 2021-11-30 DIAGNOSIS — E119 Type 2 diabetes mellitus without complications: Secondary | ICD-10-CM

## 2021-11-30 DIAGNOSIS — M6281 Muscle weakness (generalized): Secondary | ICD-10-CM | POA: Diagnosis not present

## 2021-11-30 DIAGNOSIS — R2681 Unsteadiness on feet: Secondary | ICD-10-CM | POA: Diagnosis not present

## 2021-11-30 LAB — CBC WITH DIFFERENTIAL (CANCER CENTER ONLY)
Abs Immature Granulocytes: 0.05 10*3/uL (ref 0.00–0.07)
Basophils Absolute: 0 10*3/uL (ref 0.0–0.1)
Basophils Relative: 0 %
Eosinophils Absolute: 0 10*3/uL (ref 0.0–0.5)
Eosinophils Relative: 0 %
HCT: 35.2 % — ABNORMAL LOW (ref 39.0–52.0)
Hemoglobin: 11.8 g/dL — ABNORMAL LOW (ref 13.0–17.0)
Immature Granulocytes: 1 %
Lymphocytes Relative: 10 %
Lymphs Abs: 1 10*3/uL (ref 0.7–4.0)
MCH: 28.1 pg (ref 26.0–34.0)
MCHC: 33.5 g/dL (ref 30.0–36.0)
MCV: 83.8 fL (ref 80.0–100.0)
Monocytes Absolute: 0.8 10*3/uL (ref 0.1–1.0)
Monocytes Relative: 8 %
Neutro Abs: 7.7 10*3/uL (ref 1.7–7.7)
Neutrophils Relative %: 81 %
Platelet Count: 88 10*3/uL — ABNORMAL LOW (ref 150–400)
RBC: 4.2 MIL/uL — ABNORMAL LOW (ref 4.22–5.81)
RDW: 15 % (ref 11.5–15.5)
WBC Count: 9.5 10*3/uL (ref 4.0–10.5)
nRBC: 0 % (ref 0.0–0.2)

## 2021-11-30 LAB — CMP (CANCER CENTER ONLY)
ALT: 21 U/L (ref 0–44)
AST: 24 U/L (ref 15–41)
Albumin: 3.4 g/dL — ABNORMAL LOW (ref 3.5–5.0)
Alkaline Phosphatase: 77 U/L (ref 38–126)
Anion gap: 9 (ref 5–15)
BUN: 21 mg/dL (ref 8–23)
CO2: 27 mmol/L (ref 22–32)
Calcium: 9.1 mg/dL (ref 8.9–10.3)
Chloride: 95 mmol/L — ABNORMAL LOW (ref 98–111)
Creatinine: 1.21 mg/dL (ref 0.61–1.24)
GFR, Estimated: 60 mL/min — ABNORMAL LOW (ref >60)
Glucose, Bld: 412 mg/dL — ABNORMAL HIGH (ref 70–99)
Potassium: 4 mmol/L (ref 3.5–5.1)
Sodium: 131 mmol/L — ABNORMAL LOW (ref 135–145)
Total Bilirubin: 1.2 mg/dL (ref 0.3–1.2)
Total Protein: 6.1 g/dL — ABNORMAL LOW (ref 6.5–8.1)

## 2021-11-30 MED ORDER — OXALIPLATIN CHEMO INJECTION 100 MG/20ML
65.0000 mg/m2 | Freq: Once | INTRAVENOUS | Status: AC
  Administered 2021-11-30: 130 mg via INTRAVENOUS
  Filled 2021-11-30: qty 20

## 2021-11-30 MED ORDER — FLUOROURACIL CHEMO INJECTION 2.5 GM/50ML
400.0000 mg/m2 | Freq: Once | INTRAVENOUS | Status: AC
  Administered 2021-11-30: 800 mg via INTRAVENOUS
  Filled 2021-11-30: qty 16

## 2021-11-30 MED ORDER — SODIUM CHLORIDE 0.9 % IV SOLN
10.0000 mg | Freq: Once | INTRAVENOUS | Status: AC
  Administered 2021-11-30: 10 mg via INTRAVENOUS
  Filled 2021-11-30: qty 10

## 2021-11-30 MED ORDER — PALONOSETRON HCL INJECTION 0.25 MG/5ML
0.2500 mg | Freq: Once | INTRAVENOUS | Status: AC
  Administered 2021-11-30: 0.25 mg via INTRAVENOUS
  Filled 2021-11-30: qty 5

## 2021-11-30 MED ORDER — INSULIN ASPART 100 UNIT/ML IJ SOLN
10.0000 [IU] | Freq: Once | INTRAMUSCULAR | Status: AC
  Administered 2021-11-30: 10 [IU] via SUBCUTANEOUS
  Filled 2021-11-30: qty 0.1

## 2021-11-30 MED ORDER — SODIUM CHLORIDE 0.9 % IV SOLN
2000.0000 mg/m2 | INTRAVENOUS | Status: DC
  Administered 2021-11-30: 4000 mg via INTRAVENOUS
  Filled 2021-11-30: qty 80

## 2021-11-30 MED ORDER — LEUCOVORIN CALCIUM INJECTION 350 MG
400.0000 mg/m2 | Freq: Once | INTRAVENOUS | Status: AC
  Administered 2021-11-30: 796 mg via INTRAVENOUS
  Filled 2021-11-30: qty 17.5

## 2021-11-30 MED ORDER — SODIUM CHLORIDE 0.9 % IV SOLN
150.0000 mg | Freq: Once | INTRAVENOUS | Status: AC
  Administered 2021-11-30: 150 mg via INTRAVENOUS
  Filled 2021-11-30: qty 150

## 2021-11-30 MED ORDER — DEXTROSE 5 % IV SOLN: Freq: Once | INTRAVENOUS | Status: AC

## 2021-11-30 NOTE — Progress Notes (Signed)
Patient seen by Ned Card NP today  Vitals are within treatment parameters.  Labs reviewed by [name] and are not all within treatment parameters. OK to treat w/platelet count of 88,000  Per physician team, patient is ready for treatment and there are NO modifications to the treatment plan.

## 2021-11-30 NOTE — Telephone Encounter (Signed)
Note documented

## 2021-11-30 NOTE — Progress Notes (Signed)
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  Robert Warner completed cycle 3 FOLFOX 11/16/2021.  Robert Warner had a brain CT on 11/18/2021 due to intermittent confusion.  A new "lesion" in the left cerebellum was noted when compared to brain MRI 09/16/2021 with appearance and rapid development suspicious for a subacute infarct.  Follow-up brain MRI with contrast recommended in next 4 to 6 weeks.  Robert Warner and Robert Warner continue to note intermittent confusion.  Robert Warner is working with physical therapy for continued balance issues.  No nausea or vomiting.  No mouth sores.  Intermittent jaw pain.  Robert Warner has mild persistent cold sensitivity.  No numbness or tingling in the absence of cold exposure.  No diarrhea.  Poor appetite.  Objective:  Vital signs in last 24 hours:  Blood pressure 107/73, pulse 62, temperature 97.8 F (36.6 C), resp. rate 19, height _0  (1.778 m), weight 176 lb 3.2 oz (79.9 kg), SpO2 100 %.    HEENT: No thrush or ulcers. Resp: Lungs clear bilaterally. Cardio: Regular rate and rhythm. GI: Abdomen soft and nontender.  No hepatomegaly. Vascular: No leg edema. Neuro: Alert and oriented.  Follows commands. Port-A-Cath without erythema.  Lab Results:   Lab Results  Component Value Date   WBC 9.5 11/30/2021   HGB 11.8 (L) 11/30/2021   HCT 35.2 (L) 11/30/2021   MCV 83.8 11/30/2021   PLT 88 (L) 11/30/2021   NEUTROABS 7.7 11/30/2021    Imaging:  No results found.  Medications: I have reviewed the patient's current medications.  Assessment/Plan: Poorly differentiated adenocarcinoma involving the liver Abdominal ultrasound 08/05/2021-indeterminate 61m solid mass in the right hepatic lobe.   MRI of the liver 09/06/2021-multifocal rim-enhancing lesions throughout both lobes of the liver.  Index lesion within the lateral dome of right hepatic lobe measures 1.1 cm, segment 4A lesion measures 1.6 x 1.4 cm,  segment 2 lesion measures 0.8 x 0.7 cm, posteromedial margin of the right hepatic lobe subcapsular lesion measures 2.5 x 2.0 cm 09/16/2021 CT angio neck-9 mm right upper lobe nodule Biopsy of a right liver mass on 09/23/2021.  Pathology shows poorly differentiated adenocarcinoma positive for cytokeratin 7, CDX2 and cytokeratin 20 and negative for TTF-1, PSA and prostein.  Differential diagnoses include pancreatobiliary and less likely upper GI. HER2 positive by FISH PD-L1 combined positive score 0% MSS equivocal, tumor mutation burden 0, ERBB2 amplification equivocal 09/29/2021 CA 19-9 81, CEA 33 PET scan 10/10/2021-solitary 10 mm right upper lobe pulmonary nodule with minimal FDG uptake.  7.5 mm left internal mammary lymph node hypermetabolic with SUV max 43.76  Numerous hepatic lesions.  Periportal lymphadenopathy SUV max 8.18.  2 adjacent lesions noted in the mesentery in the right mid abdomen with somewhat irregular margins, SUV 8.09.  No hypermetabolic colonic lesion identified to suggest a primary colon cancer.  No pancreatic lesion.  No gastric lesion.  No retroperitoneal lymphadenopathy. Upper endoscopy 10/13/2021-no mass, H. pylori gastritis on gastric biopsy Cycle 1 FOLFOX 10/19/2021 Cycle 2 FOLFOX 11/02/2021, Emend added, oxaliplatin dose reduced Cycle 3 FOLFOX 11/16/2021 Cycle 4 FOLFOX 11/30/2021 Hospital admission 09/16/2021 - 09/19/2021 with gait disturbance-multifocal embolic stroke on MRI 128/01/1516 echo with interatrial shunt, transcranial Doppler with bubble study indicative of a medium size right to left shunt, lower extremity Doppler studies negative for DVT Hospital admission 09/20/2021 - 09/22/2021 with chest pain-CT coronary with no acute findings.   Diabetes Hypertension BPH Chronic kidney disease Brain CT  11/18/2021-new "lesion" in the left cerebellum when compared to brain MRI 09/16/2021.  Appearance of rapid development suspicious for a subacute infarct.  Metastatic focus not  excluded.  Follow-up brain MRI with contrast recommended in the next 4 to 6 weeks.  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  Robert Warner has completed 3 cycles of FOLFOX.  Plan to proceed with cycle 4 today as scheduled.  Restaging CTs after cycle 5.  CBC and chemistry panel reviewed.  Labs adequate to proceed with treatment.  Robert Warner has mild to moderate thrombocytopenia.  Robert Warner will contact the office with bleeding.  Glucose is elevated at 412.  We will repeat a fingerstick glucose in the infusion area, administer insulin if needed.  We will contact Dr. Erick Blinks office to assist with the elevated blood sugars.  [name] saw Dr. Leonie Man when Robert Warner was hospitalized in November of last year.  Robert Warner apparently had an outpatient appointment but decided to cancel it.  We will contact Dr. Clydene Fake office to reschedule.  Robert Warner will return for lab, follow-up, cycle 5 FOLFOX in 2 weeks.  Robert Warner will contact the office in the interim with any problems.  Plan reviewed with [name].  Ned Card ANP/GNP-BC   11/30/2021  10:36 AM

## 2021-11-30 NOTE — Patient Instructions (Signed)

## 2021-11-30 NOTE — Progress Notes (Signed)
Patient presents for treatment. RN assessment completed along with the following:  Labs/vitals reviewed - Yes, and Per [name], ok to treat with platelets 88.    Weight within 10% of previous measurement - Yes Informed consent completed and reflects current therapy/intent - Yes, on date 10/19/21             Provider progress note reviewed - Providers note is not yet available.  Provider note from 11/16/21 was reviewed.  Treatment/Antibody/Supportive plan reviewed - Yes, and there are no adjustments needed for today's treatment. S&H and other orders reviewed - Yes, and there are no additional orders identified. Previous treatment date reviewed - Yes, and the appropriate amount of time has elapsed between treatments. Clinic Hand Off Received from - Merceda Elks, RN CBG to be checked during infusion appointment.   Patient to proceed with treatment.    CBG checked at 1131, resulted at 406.  Ned Card, NP made aware. Per Ned Card, NP "please give 10 units of regular insulin and repeat CBG in 2 hours".  Repeat CBG 325 at 1410. Ned Card, NP made aware. Per Ned Card, NP "please give 10 more units of regular insulin, instruct wife to check closely at home and f/u with PCP". Patient has PCP appt scheduled for 12/02/21. Called and spoke to patient's wife and informed her of need to recheck blood glucose this evening before his next scheduled dose of insulin. Patient's wife verbalized understanding and also confirmed that patient has an appointment with his PCP on Friday.  Instructed wife to call office with any additional questions or concerns.

## 2021-11-30 NOTE — Progress Notes (Signed)
Nutrition follow up completed with wife on telephone at patient request. He states he cannot remember anything. Receiving FOLFOX for liver cancer.  Patient weighed 176.2 pounds today, decreased from 194 pounds Dec 7. This is a 9% wt loss in less than 2 months which is significant.  He is on insulin and metformin.  Labs include Glucose 412, Na 101 and albumin 3.4.  Patient is eating well. He consumes 3 meals daily with one Glucerna or equivalent. He has bran cereal with walnuts and banana and milk or sausage with cheese toast and applesauce for breakfast. For lunch she fixes tuna salad or grilled cheese on whole wheat bread with fresh fruit and soup. For dinner he eats a protein like chicken, salmon, or crab cake, baked sweet potato, vegetables and applesauce. He snacks on pecans or cheese.  Was told he could only have vegetables which have been washed in special cleaning solution for vegetables. (Not evidenced based information)  Patient has been going to sleep around 7 pm and waking up in the middle of the night and snacking. Wife says he is "off balance" on his sleep schedule. She tries to keep him up later but she has not been very successful.  Patient has an appointment on Friday to evaluate blood sugar issues.  Nutrition Diagnosis: Food and Nutrition Related Knowledge Deficit ongoing  Intervention: Educated to increase fiber when possible. Whole grain cereals and breads, fresh fruit, etc. Encouraged extra fat when feasible. Change to whole milk on cereal. Add egg at breakfast. Increase Glucerna or equivalent to 2-3 cartons daily. No need to wash fruits and vegetables with a special solution. This is not evidenced base. Recommend washing fruits and vegetables in cold running water and use a brush if possible on tough skins. Await MD decision on medications for high blood sugar. Suspect some of his weight loss related to uncontrolled blood sugar.  Monitoring, Evaluation,  Goals: Patient will tolerate adequate calories and protein to support weight maintenance/gain and achieve adequate glycemic control.  Next Visit: Wednesday, Feb 15 in infusion. May call wife if patient prefers.

## 2021-11-30 NOTE — Patient Instructions (Signed)
The chemotherapy medication bag should finish at 46 hours, 96 hours, or 7 days. For example, if your pump is scheduled for 46 hours and it was put on at 4:00 p.m., it should finish at 2:00 p.m. the day it is scheduled to come off regardless of your appointment time.     Estimated time to finish at 1pm on Friday 12/02/21.   If the display on your pump reads "Low Volume" and it is beeping, take the batteries out of the pump and come to the cancer center for it to be taken off.   If the pump alarms go off prior to the pump reading "Low Volume" then call [phone] and someone can assist you.  If the plunger comes out and the chemotherapy medication is leaking out, please use your home chemo spill kit to clean up the spill. Do NOT use paper towels or other household products.  If you have problems or questions regarding your pump, please call either [phone] (24 hours a day) or the cancer center Monday-Friday 8:00 a.m.- 4:30 p.m. at the clinic number and we will assist you. If you are unable to get assistance, then go to the nearest Emergency Department and ask the staff to contact the IV team for assistance.   [facility]  Discharge Instructions: Thank you for choosing [facility] to provide your oncology and hematology care.   If you have a lab appointment with the [facility], please go directly to the [facility] and check in at the registration area.   Wear comfortable clothing and clothing appropriate for easy access to any Portacath or PICC line.   We strive to give you quality time with your provider. You may need to reschedule your appointment if you arrive late (15 or more minutes).  Arriving late affects you and other patients whose appointments are after yours.  Also, if you miss three or more appointments without notifying the office, you may be dismissed from the clinic at the providers discretion.      For prescription refill  requests, have your pharmacy contact our office and allow 72 hours for refills to be completed.    Today you received the following chemotherapy and/or immunotherapy agents oxaliplatin, leucovorin, and adruicil.    To help prevent nausea and vomiting after your treatment, we encourage you to take your nausea medication as directed.  BELOW ARE SYMPTOMS THAT SHOULD BE REPORTED IMMEDIATELY: *FEVER GREATER THAN 100.4 F (38 C) OR HIGHER *CHILLS OR SWEATING *NAUSEA AND VOMITING THAT IS NOT CONTROLLED WITH YOUR NAUSEA MEDICATION *UNUSUAL SHORTNESS OF BREATH *UNUSUAL BRUISING OR BLEEDING *URINARY PROBLEMS (pain or burning when urinating, or frequent urination) *BOWEL PROBLEMS (unusual diarrhea, constipation, pain near the anus) TENDERNESS IN MOUTH AND THROAT WITH OR WITHOUT PRESENCE OF ULCERS (sore throat, sores in mouth, or a toothache) UNUSUAL RASH, SWELLING OR PAIN  UNUSUAL VAGINAL DISCHARGE OR ITCHING   Items with * indicate a potential emergency and should be followed up as soon as possible or go to the Emergency Department if any problems should occur.  Please show the CHEMOTHERAPY ALERT CARD or IMMUNOTHERAPY ALERT CARD at check-in to the Emergency Department and triage nurse.  Should you have questions after your visit or need to cancel or reschedule your appointment, please contact [facility]  Dept: [phone]  and follow the prompts.  Office hours are 8:00 a.m. to 4:30 p.m. Monday - Friday. Please note that voicemails left after  4:00 p.m. may not be returned until the following business day.  We are closed weekends and major holidays. You have access to a nurse at all times for urgent questions. Please call the main number to the clinic Dept: [phone] and follow the prompts.   For any non-urgent questions, you may also contact your provider using MyChart. We now offer e-Visits for anyone 87 and older to request care online for non-urgent symptoms. For  details visit [email or web address].   Also download the MyChart app! Go to the app store, search "MyChart", open the app, select [facility], and log in with your MyChart username and password.  Due to Covid, a mask is required upon entering the hospital/clinic. If you do not have a mask, one will be given to you upon arrival. For doctor visits, patients may have 1 support person aged 23 or older with them. For treatment visits, patients cannot have anyone with them due to current Covid guidelines and our immunocompromised population.   Oxaliplatin Injection What is this medication? OXALIPLATIN (ox AL i PLA tin) is a chemotherapy drug. It targets fast dividing cells, like cancer cells, and causes these cells to die. This medicine is used to treat cancers of the colon and rectum, and many other cancers. This medicine may be used for other purposes; ask your health care provider or pharmacist if you have questions. COMMON BRAND NAME(S): Eloxatin What should I tell my care team before I take this medication? They need to know if you have any of these conditions: heart disease history of irregular heartbeat liver disease low blood counts, like white cells, platelets, or red blood cells lung or breathing disease, like asthma take medicines that treat or prevent blood clots tingling of the fingers or toes, or other nerve disorder an unusual or allergic reaction to oxaliplatin, other chemotherapy, other medicines, foods, dyes, or preservatives pregnant or trying to get pregnant breast-feeding How should I use this medication? This drug is given as an infusion into a vein. It is administered in a hospital or clinic by a specially trained health care professional. Talk to your pediatrician regarding the use of this medicine in children. Special care may be needed. Overdosage: If you think you have taken too much of this medicine contact a poison control center or emergency room at once. NOTE:  This medicine is only for you. Do not share this medicine with others. What if I miss a dose? It is important not to miss a dose. Call your doctor or health care professional if you are unable to keep an appointment. What may interact with this medication? Do not take this medicine with any of the following medications: cisapride dronedarone pimozide thioridazine This medicine may also interact with the following medications: aspirin and aspirin-like medicines certain medicines that treat or prevent blood clots like warfarin, apixaban, dabigatran, and rivaroxaban cisplatin cyclosporine diuretics medicines for infection like acyclovir, adefovir, amphotericin B, bacitracin, cidofovir, foscarnet, ganciclovir, gentamicin, pentamidine, vancomycin NSAIDs, medicines for pain and inflammation, like ibuprofen or naproxen other medicines that prolong the QT interval (an abnormal heart rhythm) pamidronate zoledronic acid This list may not describe all possible interactions. Give your health care provider a list of all the medicines, herbs, non-prescription drugs, or dietary supplements you use. Also tell them if you smoke, drink alcohol, or use illegal drugs. Some items may interact with your medicine. What should I watch for while using this medication? Your condition will be monitored carefully while you are receiving this medicine. You  may need blood work done while you are taking this medicine. This medicine may make you feel generally unwell. This is not uncommon as chemotherapy can affect healthy cells as well as cancer cells. Report any side effects. Continue your course of treatment even though you feel ill unless your healthcare professional tells you to stop. This medicine can make you more sensitive to cold. Do not drink cold drinks or use ice. Cover exposed skin before coming in contact with cold temperatures or cold objects. When out in cold weather wear warm clothing and cover your mouth  and nose to warm the air that goes into your lungs. Tell your doctor if you get sensitive to the cold. Do not become pregnant while taking this medicine or for 9 months after stopping it. Women should inform their health care professional if they wish to become pregnant or think they might be pregnant. Men should not father a child while taking this medicine and for 6 months after stopping it. There is potential for serious side effects to an unborn child. Talk to your health care professional for more information. Do not breast-feed a child while taking this medicine or for 3 months after stopping it. This medicine has caused ovarian failure in some women. This medicine may make it more difficult to get pregnant. Talk to your health care professional if you are concerned about your fertility. This medicine has caused decreased sperm counts in some men. This may make it more difficult to father a child. Talk to your health care professional if you are concerned about your fertility. This medicine may increase your risk of getting an infection. Call your health care professional for advice if you get a fever, chills, or sore throat, or other symptoms of a cold or flu. Do not treat yourself. Try to avoid being around people who are sick. Avoid taking medicines that contain aspirin, acetaminophen, ibuprofen, naproxen, or ketoprofen unless instructed by your health care professional. These medicines may hide a fever. Be careful brushing or flossing your teeth or using a toothpick because you may get an infection or bleed more easily. If you have any dental work done, tell your dentist you are receiving this medicine. What side effects may I notice from receiving this medication? Side effects that you should report to your doctor or health care professional as soon as possible: allergic reactions like skin rash, itching or hives, swelling of the face, lips, or tongue breathing problems cough low blood counts  - this medicine may decrease the number of white blood cells, red blood cells, and platelets. You may be at increased risk for infections and bleeding nausea, vomiting pain, redness, or irritation at site where injected pain, tingling, numbness in the hands or feet signs and symptoms of bleeding such as bloody or black, tarry stools; red or dark brown urine; spitting up blood or brown material that looks like coffee grounds; red spots on the skin; unusual bruising or bleeding from the eyes, gums, or nose signs and symptoms of a dangerous change in heartbeat or heart rhythm like chest pain; dizziness; fast, irregular heartbeat; palpitations; feeling faint or lightheaded; falls signs and symptoms of infection like fever; chills; cough; sore throat; pain or trouble passing urine signs and symptoms of liver injury like dark yellow or brown urine; general ill feeling or flu-like symptoms; light-colored stools; loss of appetite; nausea; right upper belly pain; unusually weak or tired; yellowing of the eyes or skin signs and symptoms of low red  blood cells or anemia such as unusually weak or tired; feeling faint or lightheaded; falls signs and symptoms of muscle injury like dark urine; trouble passing urine or change in the amount of urine; unusually weak or tired; muscle pain; back pain Side effects that usually do not require medical attention (report to your doctor or health care professional if they continue or are bothersome): changes in taste diarrhea gas hair loss loss of appetite mouth sores This list may not describe all possible side effects. Call your doctor for medical advice about side effects. You may report side effects to FDA at 1-800-FDA-1088. Where should I keep my medication? This drug is given in a hospital or clinic and will not be stored at home. NOTE: This sheet is a summary. It may not cover all possible information. If you have questions about this medicine, talk to your doctor,  pharmacist, or health care provider.  2022 Elsevier/Gold Standard (2021-07-19 00:00:00)  Leucovorin injection What is this medication? LEUCOVORIN (loo koe VOR in) is used to prevent or treat the harmful effects of some medicines. This medicine is used to treat anemia caused by a low amount of folic acid in the body. It is also used with 5-fluorouracil (5-FU) to treat colon cancer. This medicine may be used for other purposes; ask your health care provider or pharmacist if you have questions. What should I tell my care team before I take this medication? They need to know if you have any of these conditions: anemia from low levels of vitamin B-12 in the blood an unusual or allergic reaction to leucovorin, folic acid, other medicines, foods, dyes, or preservatives pregnant or trying to get pregnant breast-feeding How should I use this medication? This medicine is for injection into a muscle or into a vein. It is given by a health care professional in a hospital or clinic setting. Talk to your pediatrician regarding the use of this medicine in children. Special care may be needed. Overdosage: If you think you have taken too much of this medicine contact a poison control center or emergency room at once. NOTE: This medicine is only for you. Do not share this medicine with others. What if I miss a dose? This does not apply. What may interact with this medication? capecitabine fluorouracil phenobarbital phenytoin primidone trimethoprim-sulfamethoxazole This list may not describe all possible interactions. Give your health care provider a list of all the medicines, herbs, non-prescription drugs, or dietary supplements you use. Also tell them if you smoke, drink alcohol, or use illegal drugs. Some items may interact with your medicine. What should I watch for while using this medication? Your condition will be monitored carefully while you are receiving this medicine. This medicine may  increase the side effects of 5-fluorouracil, 5-FU. Tell your doctor or health care professional if you have diarrhea or mouth sores that do not get better or that get worse. What side effects may I notice from receiving this medication? Side effects that you should report to your doctor or health care professional as soon as possible: allergic reactions like skin rash, itching or hives, swelling of the face, lips, or tongue breathing problems fever, infection mouth sores unusual bleeding or bruising unusually weak or tired Side effects that usually do not require medical attention (report to your doctor or health care professional if they continue or are bothersome): constipation or diarrhea loss of appetite nausea, vomiting This list may not describe all possible side effects. Call your doctor for medical advice about  side effects. You may report side effects to FDA at 1-800-FDA-1088. Where should I keep my medication? This drug is given in a hospital or clinic and will not be stored at home. NOTE: This sheet is a summary. It may not cover all possible information. If you have questions about this medicine, talk to your doctor, pharmacist, or health care provider.  2022 Elsevier/Gold Standard (2008-05-07 00:00:00)  Fluorouracil, 5-FU injection What is this medication? FLUOROURACIL, 5-FU (flure oh YOOR a sil) is a chemotherapy drug. It slows the growth of cancer cells. This medicine is used to treat many types of cancer like breast cancer, colon or rectal cancer, pancreatic cancer, and stomach cancer. This medicine may be used for other purposes; ask your health care provider or pharmacist if you have questions. COMMON BRAND NAME(S): Adrucil What should I tell my care team before I take this medication? They need to know if you have any of these conditions: blood disorders dihydropyrimidine dehydrogenase (DPD) deficiency infection (especially a virus infection such as chickenpox, cold  sores, or herpes) kidney disease liver disease malnourished, poor nutrition recent or ongoing radiation therapy an unusual or allergic reaction to fluorouracil, other chemotherapy, other medicines, foods, dyes, or preservatives pregnant or trying to get pregnant breast-feeding How should I use this medication? This drug is given as an infusion or injection into a vein. It is administered in a hospital or clinic by a specially trained health care professional. Talk to your pediatrician regarding the use of this medicine in children. Special care may be needed. Overdosage: If you think you have taken too much of this medicine contact a poison control center or emergency room at once. NOTE: This medicine is only for you. Do not share this medicine with others. What if I miss a dose? It is important not to miss your dose. Call your doctor or health care professional if you are unable to keep an appointment. What may interact with this medication? Do not take this medicine with any of the following medications: live virus vaccines This medicine may also interact with the following medications: medicines that treat or prevent blood clots like warfarin, enoxaparin, and dalteparin This list may not describe all possible interactions. Give your health care provider a list of all the medicines, herbs, non-prescription drugs, or dietary supplements you use. Also tell them if you smoke, drink alcohol, or use illegal drugs. Some items may interact with your medicine. What should I watch for while using this medication? Visit your doctor for checks on your progress. This drug may make you feel generally unwell. This is not uncommon, as chemotherapy can affect healthy cells as well as cancer cells. Report any side effects. Continue your course of treatment even though you feel ill unless your doctor tells you to stop. In some cases, you may be given additional medicines to help with side effects. Follow all  directions for their use. Call your doctor or health care professional for advice if you get a fever, chills or sore throat, or other symptoms of a cold or flu. Do not treat yourself. This drug decreases your body's ability to fight infections. Try to avoid being around people who are sick. This medicine may increase your risk to bruise or bleed. Call your doctor or health care professional if you notice any unusual bleeding. Be careful brushing and flossing your teeth or using a toothpick because you may get an infection or bleed more easily. If you have any dental work done, tell your  dentist you are receiving this medicine. Avoid taking products that contain aspirin, acetaminophen, ibuprofen, naproxen, or ketoprofen unless instructed by your doctor. These medicines may hide a fever. Do not become pregnant while taking this medicine. Women should inform their doctor if they wish to become pregnant or think they might be pregnant. There is a potential for serious side effects to an unborn child. Talk to your health care professional or pharmacist for more information. Do not breast-feed an infant while taking this medicine. Men should inform their doctor if they wish to father a child. This medicine may lower sperm counts. Do not treat diarrhea with over the counter products. Contact your doctor if you have diarrhea that lasts more than 2 days or if it is severe and watery. This medicine can make you more sensitive to the sun. Keep out of the sun. If you cannot avoid being in the sun, wear protective clothing and use sunscreen. Do not use sun lamps or tanning beds/booths. What side effects may I notice from receiving this medication? Side effects that you should report to your doctor or health care professional as soon as possible: allergic reactions like skin rash, itching or hives, swelling of the face, lips, or tongue low blood counts - this medicine may decrease the number of white blood cells, red  blood cells and platelets. You may be at increased risk for infections and bleeding. signs of infection - fever or chills, cough, sore throat, pain or difficulty passing urine signs of decreased platelets or bleeding - bruising, pinpoint red spots on the skin, black, tarry stools, blood in the urine signs of decreased red blood cells - unusually weak or tired, fainting spells, lightheadedness breathing problems changes in vision chest pain mouth sores nausea and vomiting pain, swelling, redness at site where injected pain, tingling, numbness in the hands or feet redness, swelling, or sores on hands or feet stomach pain unusual bleeding Side effects that usually do not require medical attention (report to your doctor or health care professional if they continue or are bothersome): changes in finger or toe nails diarrhea dry or itchy skin hair loss headache loss of appetite sensitivity of eyes to the light stomach upset unusually teary eyes This list may not describe all possible side effects. Call your doctor for medical advice about side effects. You may report side effects to FDA at 1-800-FDA-1088. Where should I keep my medication? This drug is given in a hospital or clinic and will not be stored at home. NOTE: This sheet is a summary. It may not cover all possible information. If you have questions about this medicine, talk to your doctor, pharmacist, or health care provider.  2022 Elsevier/Gold Standard (2021-07-19 00:00:00)

## 2021-11-30 NOTE — Progress Notes (Signed)
PATIENT NAVIGATOR PROGRESS NOTE  Name: Robert Warner Date: 11/30/2021 MRN: [id number]  DOB: February 18, 1941   Reason for visit:  F/U visit for chemo and blood sugars elevated  Comments:  His blood sugar is elevated today in clinic, he is having difficulty checking his blood sugar at home and difficulty obtaining fasting glucose due to getting up in the middle of the night and eating snacks.  Call placed to Dr Elease Hashimoto office and they will see him in clinic on Friday to evaluate.  Per Mrs Romagnoli he needs a simple plan, he is having difficulty managing simple tasks.  Will continue to follow and assist as needed.     Time spent counseling/coordinating care: 30-45 minutes

## 2021-11-30 NOTE — Telephone Encounter (Signed)
Called and spoke with the patient's wife to inform her the patient is schedule with Dr. Leonie Man on Feb. 17, 2023 at 8:30 check in at 8:00 am. Patient's wife voiced understanding of instruction and had no further question or concern at this time.

## 2021-12-01 ENCOUNTER — Telehealth: Payer: Self-pay

## 2021-12-01 NOTE — Telephone Encounter (Signed)
-----   Message from [name], NP sent at 11/30/2021  3:36 PM EST ----- Please call wife tomorrow and check on him, blood sugars. Thanks

## 2021-12-01 NOTE — Telephone Encounter (Signed)
Called and spoke with the patient 's wife. She stated the patient blood sugar was 274 and he had 14 units of insulin at 5 am.

## 2021-12-02 ENCOUNTER — Ambulatory Visit (INDEPENDENT_AMBULATORY_CARE_PROVIDER_SITE_OTHER): Payer: Medicare HMO | Admitting: Family Medicine

## 2021-12-02 ENCOUNTER — Inpatient Hospital Stay: Payer: Medicare HMO

## 2021-12-02 ENCOUNTER — Other Ambulatory Visit: Payer: Self-pay

## 2021-12-02 ENCOUNTER — Ambulatory Visit: Payer: Medicare HMO | Admitting: Rehabilitative and Restorative Service Providers"

## 2021-12-02 VITALS — BP 139/94 | HR 68 | Temp 98.2°F | Resp 20

## 2021-12-02 VITALS — BP 126/70 | HR 70 | Temp 98.3°F | Wt 179.4 lb

## 2021-12-02 DIAGNOSIS — C801 Malignant (primary) neoplasm, unspecified: Secondary | ICD-10-CM

## 2021-12-02 DIAGNOSIS — R293 Abnormal posture: Secondary | ICD-10-CM | POA: Diagnosis not present

## 2021-12-02 DIAGNOSIS — R2689 Other abnormalities of gait and mobility: Secondary | ICD-10-CM | POA: Diagnosis not present

## 2021-12-02 DIAGNOSIS — R2681 Unsteadiness on feet: Secondary | ICD-10-CM

## 2021-12-02 DIAGNOSIS — E1165 Type 2 diabetes mellitus with hyperglycemia: Secondary | ICD-10-CM

## 2021-12-02 DIAGNOSIS — M6281 Muscle weakness (generalized): Secondary | ICD-10-CM

## 2021-12-02 DIAGNOSIS — Z794 Long term (current) use of insulin: Secondary | ICD-10-CM

## 2021-12-02 MED ORDER — HUMALOG KWIKPEN 200 UNIT/ML ~~LOC~~ SOPN: PEN_INJECTOR | SUBCUTANEOUS | 2 refills | Status: DC

## 2021-12-02 MED ORDER — SODIUM CHLORIDE 0.9% FLUSH
10.0000 mL | INTRAVENOUS | Status: DC | PRN
  Administered 2021-12-02: 10 mL

## 2021-12-02 MED ORDER — HEPARIN SOD (PORK) LOCK FLUSH 100 UNIT/ML IV SOLN
500.0000 [IU] | Freq: Once | INTRAVENOUS | Status: AC | PRN
  Administered 2021-12-02: 500 [IU]

## 2021-12-02 MED ORDER — DEXCOM G6 SENSOR MISC: 1 refills | Status: AC

## 2021-12-02 MED ORDER — DEXCOM G6 RECEIVER DEVI: 0 refills | Status: AC

## 2021-12-02 NOTE — Progress Notes (Signed)
Established Patient Office Visit  Subjective:  Patient ID: Robert Warner, male    DOB: 11-Oct-1941  Age: 81 y.o. MRN: [id number]  CC:  Chief Complaint  Patient presents with   Follow-up    HPI [name] presents for follow-up to discuss recent elevated blood sugars.  He has longstanding history of type 2 diabetes.  Recently diagnosed adenocarcinoma from biopsy of liver with unknown primary.  He is currently receiving treatment with FOLFOX recent CT head 11/18/2021 secondary to intermittent confusion.  This showed a new lesion left cerebellum with question of subacute infarct versus met.  He has been referred to neurology and plan is to get follow-up MRI brain with contrast in the next few weeks.  Continues to be intermittently confused.  Has been working recently with physical therapy for balance issues.  Wife states he is actually eating fairly well.  They have been giving boost but also have Glucerna at home.  He is eating 3 meals daily.  He is continue to lose some weight.  They bring in several blood sugar readings and most of these were fastings.  Low of 196 and high of 436.  Multiple readings in the 2 and 300s.  They have checked some blood sugars presupper and these are also consistently over 200.  Currently on Toujeo 16 units at night.  Also takes metformin extended release 750 mg.  Recent GFR 60.  Wt Readings from Last 3 Encounters:  12/02/21 179 lb 6.4 oz (81.4 kg)  11/30/21 176 lb 3.2 oz (79.9 kg)  11/16/21 178 lb 9.6 oz (81 kg)     Past Medical History:  Diagnosis Date   Arthritis    Cancer ([place])    Cataract    Diabetes mellitus    Hyperlipidemia    Hypertension    Stroke [facility])     Past Surgical History:  Procedure Laterality Date   APPENDECTOMY  1968   CATARACT EXTRACTION Bilateral 07/2000   states 2 weeks ago (first of sept) OD due in Dec    COLONOSCOPY  2004,2009   Negative, Dr. Sharlett Iles    IR IMAGING GUIDED PORT INSERTION  10/14/2021   KIDNEY STONE  SURGERY  10/2018   PROSTATE BIOPSY  2006   Dr.Sigmund Tannebaum    Family History  Problem Relation Age of Onset   Hypertension Mother    Coronary artery disease Mother    Stroke Father 58   Stroke Sister    Pancreatic cancer Brother    Diabetes Maternal Aunt    Coronary artery disease Maternal Uncle        2 Maternal Uncles    Diabetes Maternal Uncle    Breast cancer Paternal Aunt    Coronary artery disease Paternal Aunt    Coronary artery disease Maternal Grandmother    Stroke Paternal Grandmother    Heart disease Neg Hx    Colon cancer Neg Hx    Esophageal cancer Neg Hx    Rectal cancer Neg Hx    Stomach cancer Neg Hx     Social History   Socioeconomic History   Marital status: Married    Spouse name: Not on file   Number of children: 2   Years of education: Not on file   Highest education level: Not on file  Occupational History   Occupation: retired Airline pilot  Tobacco Use   Smoking status: Former    Packs/day: 0.50    Years: 10.00    Pack years: 5.00  Types: Cigarettes    Quit date: 11/13/1966    Years since quitting: 55.0   Smokeless tobacco: Never  Vaping Use   Vaping Use: Never used  Substance and Sexual Activity   Alcohol use: Not Currently    Alcohol/week: 2.0 standard drinks    Types: 2 Glasses of wine per week    Comment: Occasional wine or beer   Drug use: No   Sexual activity: Not on file  Other Topics Concern   Not on file  Social History Narrative   Not on file   Social Determinants of Health   Financial Resource Strain: Low Risk    Difficulty of Paying Living Expenses: Not hard at all  Food Insecurity: No Food Insecurity   Worried About Charity fundraiser in the Last Year: Never true   Ran Out of Food in the Last Year: Never true  Transportation Needs: No Transportation Needs   Lack of Transportation (Medical): No   Lack of Transportation (Non-Medical): No  Physical Activity: Sufficiently Active   Days of Exercise per Week: 3  days   Minutes of Exercise per Session: 60 min  Stress: No Stress Concern Present   Feeling of Stress : Not at all  Social Connections: Socially Integrated   Frequency of Communication with Friends and Family: Three times a week   Frequency of Social Gatherings with Friends and Family: Three times a week   Attends Religious Services: More than 4 times per year   Active Member of Clubs or Organizations: Yes   Attends Music therapist: More than 4 times per year   Marital Status: Married  Human resources officer Violence: Not At Risk   Fear of Current or Ex-Partner: No   Emotionally Abused: No   Physically Abused: No   Sexually Abused: No    Outpatient Medications Prior to Visit  Medication Sig Dispense Refill   Accu-Chek Softclix Lancets lancets Use to test blood sugar levels once a day. 100 each 12   acetaminophen (TYLENOL) 650 MG CR tablet Take 650 mg by mouth every 8 (eight) hours as needed for pain.     Blood Glucose Monitoring Suppl (ACCU-CHEK AVIVA PLUS) w/Device KIT Use to check blood sugar once a day 1 kit 0   Cholecalciferol (VITAMIN D3) 2000 UNITS TABS Take 2,000 Units by mouth daily.     clopidogrel (PLAVIX) 75 MG tablet Take 75 mg by mouth daily.     glucose blood test strip Use as instructed 100 each 12   insulin glargine, 2 Unit Dial, (TOUJEO MAX SOLOSTAR) 300 UNIT/ML Solostar Pen Inject 10 Units into the skin daily. (Patient taking differently: Inject 20 Units into the skin daily.) 3 mL 1   lidocaine-prilocaine (EMLA) cream Apply 1 application topically as needed. Apply 1/2 tablespoon to port site 2 hours prior to stick and cover with Press-and-Seal to numb site for access 30 g 1   metFORMIN (GLUCOPHAGE-XR) 750 MG 24 hr tablet TAKE 1 TABLET BY MOUTH EVERY DAY WITH BREAKFAST 90 tablet 0   ondansetron (ZOFRAN) 8 MG tablet Take 1 tablet (8 mg total) by mouth every 8 (eight) hours as needed for nausea or vomiting. Start 72 hours after each IV chemotherapy administration  30 tablet 1   pantoprazole (PROTONIX) 20 MG tablet TAKE 1 TABLET(20 MG) BY MOUTH DAILY 90 tablet 0   prochlorperazine (COMPAZINE) 10 MG tablet Take 1 tablet (10 mg total) by mouth every 6 (six) hours as needed for nausea. 60 tablet 1  silodosin (RAPAFLO) 8 MG CAPS capsule Take 8 mg by mouth at bedtime.     traMADol (ULTRAM) 50 MG tablet Take 1 tablet (50 mg total) by mouth every 6 (six) hours as needed for moderate pain. 60 tablet 0   TRAVATAN Z 0.004 % SOLN ophthalmic solution Place 1 drop into both eyes at bedtime.     atorvastatin (LIPITOR) 40 MG tablet Take 1 tablet (40 mg total) by mouth daily. 90 tablet 1   sodium chloride flush (NS) 0.9 % injection 10 mL      No facility-administered medications prior to visit.    No Known Allergies  ROS Review of Systems  Constitutional:  Positive for appetite change.  Respiratory:  Negative for cough and shortness of breath.   Cardiovascular:  Negative for chest pain.  Genitourinary:  Negative for dysuria.  Neurological:  Negative for headaches.  Psychiatric/Behavioral:  Positive for confusion.      Objective:    Physical Exam Vitals reviewed.  Constitutional:      General: He is not in acute distress. Cardiovascular:     Rate and Rhythm: Normal rate.  Pulmonary:     Effort: Pulmonary effort is normal.     Breath sounds: Normal breath sounds.  Neurological:     Mental Status: He is alert.    BP 126/70 (BP Location: Left Arm, Patient Position: Sitting, Cuff Size: Normal)    Pulse 70    Temp 98.3 F (36.8 C) (Oral)    Wt 179 lb 6.4 oz (81.4 kg)    SpO2 99%    BMI 25.74 kg/m  Wt Readings from Last 3 Encounters:  12/02/21 179 lb 6.4 oz (81.4 kg)  11/30/21 176 lb 3.2 oz (79.9 kg)  11/16/21 178 lb 9.6 oz (81 kg)     Health Maintenance Due  Topic Date Due   Zoster Vaccines- Shingrix (1 of 2) Never done   TETANUS/TDAP  06/15/2018   FOOT EXAM  07/06/2020    There are no preventive care reminders to display for this  patient.  Lab Results  Component Value Date   TSH 1.25 10/15/2013   Lab Results  Component Value Date   WBC 9.5 11/30/2021   HGB 11.8 (L) 11/30/2021   HCT 35.2 (L) 11/30/2021   MCV 83.8 11/30/2021   PLT 88 (L) 11/30/2021   Lab Results  Component Value Date   NA 131 (L) 11/30/2021   K 4.0 11/30/2021   CO2 27 11/30/2021   GLUCOSE 412 (H) 11/30/2021   BUN 21 11/30/2021   CREATININE 1.21 11/30/2021   BILITOT 1.2 11/30/2021   ALKPHOS 77 11/30/2021   AST 24 11/30/2021   ALT 21 11/30/2021   PROT 6.1 (L) 11/30/2021   ALBUMIN 3.4 (L) 11/30/2021   CALCIUM 9.1 11/30/2021   ANIONGAP 9 11/30/2021   GFR 54.73 (L) 09/13/2021   Lab Results  Component Value Date   CHOL 164 09/17/2021   Lab Results  Component Value Date   HDL 57 09/17/2021   Lab Results  Component Value Date   LDLCALC 97 09/17/2021   Lab Results  Component Value Date   TRIG 49 09/17/2021   Lab Results  Component Value Date   CHOLHDL 2.9 09/17/2021   Lab Results  Component Value Date   HGBA1C 6.9 (H) 09/17/2021      Assessment & Plan:   #1 history of type 2 diabetes.  Recent poor control.  Has had consistent blood sugars over 200 with readings as high as  436.  Last A1c in November was 6.9%.  Currently on Toujeo 16 units and metformin.  We suggested the following  -Switch from boost to Glucerna -Increase Toujeo to 20 units daily -Add Humalog 5 units subcutaneous twice daily at breakfast and supper -We will see if we can get continuous cutaneous monitor to avoid frequent fingersticks.  Prescription sent for Dexcom -Wife to give [place] some feedback next week through phone call or MyChart regarding some readings  #2 adenocarcinoma by recent liver biopsy with unknown primary.  Followed closely by oncology.  Undergoing treatment with FOLFOX.  #3 recent intermittent confusion.  CT showing new lesion in cerebellum with question of subacute infarct versus met.  He has pending follow-up with neurology.  Meds  ordered this encounter  Medications   insulin lispro (HUMALOG KWIKPEN) 200 UNIT/ML KwikPen    Sig: Give 5 units [place] twice daily- with breakfast and supper.    Dispense:  3 mL    Refill:  2   Continuous Blood Gluc Sensor (DEXCOM G6 SENSOR) MISC    Sig: Use to check blood glucose levels as directed.    Dispense:  3 each    Refill:  1   Continuous Blood Gluc Receiver (DEXCOM G6 RECEIVER) DEVI    Sig: Use to check blood sugar levels as directed.    Dispense:  1 each    Refill:  0    Follow-up: No follow-ups on file.    [name], MD

## 2021-12-02 NOTE — Therapy (Signed)
[facility] [address] [place], [place], [address] Phone: [phone]   Fax:  [phone]  Physical Therapy Treatment  Patient Details  Name: Robert Warner MRN: [id number] Date of Birth: Mar 08, 1941 Referring Provider (PT): Antonieta Pert, MD   Encounter Date: 12/02/2021   PT End of Session - 12/02/21 0954     Visit Number 10    Number of Visits 24    Date for PT Re-Evaluation 01/20/22    Authorization Type [employer] (submitted again at Jan re-eval visit)-awaiting reauth    Authorization Time Period 16 visits thru 01/20/22    Authorization - Visit Number 2    Authorization - Number of Visits 16    Progress Note Due on Visit 10    PT Start Time 0933    PT Stop Time 1012    PT Time Calculation (min) 39 min    Equipment Utilized During Treatment Gait belt    Activity Tolerance Patient tolerated treatment well    Behavior During Therapy [facility] for tasks assessed/performed             Past Medical History:  Diagnosis Date   Arthritis    Cancer ([place])    Cataract    Diabetes mellitus    Hyperlipidemia    Hypertension    Stroke [facility])     Past Surgical History:  Procedure Laterality Date   APPENDECTOMY  1968   CATARACT EXTRACTION Bilateral 07/2000   states 2 weeks ago (first of sept) OD due in Dec    COLONOSCOPY  2004,2009   Negative, Dr. Sharlett Iles    IR IMAGING GUIDED PORT INSERTION  10/14/2021   KIDNEY STONE SURGERY  10/2018   PROSTATE BIOPSY  2006   Dr.Sigmund Tannebaum    There were no vitals filed for this visit.   Subjective Assessment - 12/02/21 0937     Subjective The patient notes he is working to manage his sugar with his physicians.  It was around "4 something" and he took metformin and insulin.  He notes he doesn't feel bad when it is high.  He has an appointment this afternoon with his MD re: blood sugar.  We discussed precautions due to elevated blood sugar and reduced intensity of activity today.     Pertinent History DMT2, recent CVA, HTN, HLD    Patient Stated Goals improve balance    Currently in Pain? No/denies                [facility] PT Assessment - 12/02/21 0947       Assessment   Medical Diagnosis Cerebrovascular accident (CVA) due to embolism of precerebral artery    Referring Provider (PT) Antonieta Pert, MD    Onset Date/Surgical Date 09/16/21      Strength   Overall Strength Comments from 11/25/20    Right Hip Flexion 4/5    Right Hip ABduction 4+/5    Right Hip ADduction 4+/5    Left Hip Flexion 4/5    Left Hip ABduction 4+/5    Left Hip ADduction 4+/5    Right Knee Flexion 4+/5    Right Knee Extension 5/5    Left Knee Flexion 4+/5    Left Knee Extension 4/5    Right Ankle Dorsiflexion 3+/5    Right Ankle Plantar Flexion 4/5    Left Ankle Dorsiflexion 4+/5    Left Ankle Plantar Flexion 4/5      Ambulation/Gait   Gait Comments gait with  SPC on indoor and outdoor surfaces working on Copywriter, advertising                           [facility] Adult PT Treatment/Exercise - 12/02/21 0947       Ambulation/Gait   Ambulation/Gait Yes    Ambulation/Gait Assistance 6: Modified independent (Device/Increase time)    Ambulation Distance (Feet) 250 Feet    Assistive device Straight cane    Ambulation Surface Level;Indoor    Gait velocity slightly decreased      Exercises   Exercises Knee/Hip;Ankle      Knee/Hip Exercises: Stretches   Passive Hamstring Stretch Right;Left;2 reps    Gastroc Stretch Right;Left;30 seconds;2 reps    Gastroc Stretch Limitations in standing at countertop      Knee/Hip Exercises: Standing   Hip Abduction Stengthening;Both;10 reps    Abduction Limitations sidestepping along countertop R And L sides      Knee/Hip Exercises: Supine   Bridges Strengthening;Both;10 reps                     PT Education - 12/02/21 0949     Education Details PT and patient discussed HEP: [id number].  We  discussed exercise modification if blood sugar elevated and provided a handout from epic re: exercise and blood sugar.    Person(s) Educated Patient    Methods Explanation;Handout    Comprehension Verbalized understanding              PT Short Term Goals - 11/25/21 1204       PT SHORT TERM GOAL #1   Title Patient to be independent with initial HEP.    Time 3    Period Weeks    Status Achieved    Target Date 10/21/21               PT Long Term Goals - 12/02/21 1150       PT LONG TERM GOAL #1   Title Patient to be independent with advanced HEP.    Time 8    Period Weeks    Status Partially Met    Target Date 01/20/22      PT LONG TERM GOAL #2   Title Patient to complete TUG in <14 sec with LRAD in order to decrease risk of falls.    Time 6    Period Weeks    Status Achieved      PT LONG TERM GOAL #3   Title Patient to score at least 23/30 on FGA in order to decrease risk of falls.    Baseline 20/30 at last assessment    Time 8    Period Weeks    Status On-going   20/30   Target Date 01/20/22      PT LONG TERM GOAL #4   Title Patient to demonstrate B LE strength >/=4+/5.    Time 8    Period Weeks    Status Partially Met    Target Date 01/20/22      PT LONG TERM GOAL #5   Title Patient will ambulate over outdoor surfaces with LRAD while performing head turns to scan environment with good stability in order to indicate safe community mobility.    Time 8    Period Weeks    Status Partially Met   NT today d/t time   Target Date 01/20/22  Plan - 12/02/21 1158     Clinical Impression Statement The patient reported elevated blood sugar this morning-- he had adjusted meds, but did not recheck to ensure it has lowered.  PT spent time educating re: risk of exercise during periods of hyperglycemia and printed a handout for home on diabetes and exercise.  We modified today's activities to be less focused on muscular strengthening and  worked more on flexibility, functional gait.  Plan to progress to patient tolerance.  Patient requested 1x/week-- scheduled that way and will have him discuss with primary PT next session.    Rehab Potential Good    PT Frequency 2x / week    PT Duration 8 weeks    PT Treatment/Interventions ADLs/Self Care Home Management;Canalith Repostioning;Cryotherapy;Electrical Stimulation;DME Instruction;Ultrasound;Moist Heat;Gait training;Stair training;Functional mobility training;Therapeutic activities;Therapeutic exercise;Balance training;Neuromuscular re-education;Manual techniques;Patient/family education;Passive range of motion;Dry needling;Energy conservation;Vestibular;Vasopneumatic Device;Taping    PT Next Visit Plan 10th Visit Progress note; Continue R ankle stretching and strengthening, standing balance activities including head turns, turning, narrow BOS for balance    Consulted and Agree with Plan of Care Patient             Patient will benefit from skilled therapeutic intervention in order to improve the following deficits and impairments:     Visit Diagnosis: Muscle weakness (generalized)  Unsteadiness on feet  Other abnormalities of gait and mobility  Abnormal posture     Problem List Patient Active Problem List   Diagnosis Date Noted   Cancer with unknown primary site ([place]) 10/11/2021   Goals of care, counseling/discussion 10/11/2021   Chest pain 09/21/2021   Recent cerebrovascular accident (CVA) 09/21/2021   Interatrial cardiac shunt 09/21/2021   CKD (chronic kidney disease) stage 3, GFR 30-59 ml/min ([place]) 71/24/5809   Acute embolic stroke ([place]) 98/33/8250   Mixed diabetic hyperlipidemia associated with type 2 diabetes mellitus ([place]) 09/16/2021   Liver masses 09/16/2021   Essential hypertension 03/12/2012   Type 2 diabetes mellitus without complication, without long-term current use of insulin ([place]) 06/15/2008   BPH (benign prostatic hyperplasia) 06/15/2008     Dylyn Mclaren, PT 12/02/2021, 12:02 PM  [facility] [address] [place], [place], [address] Phone: [phone]   Fax:  [phone]  Name: CALDEN DORSEY MRN: [id number] Date of Birth: 09-01-1941

## 2021-12-02 NOTE — Patient Instructions (Signed)
Increase the Tresiba to 20 units every night  Start the Humalog 5 units twice daily- with breakfast  and dinner  We will try to get the Dexcom continuous meter.  Try using the Glucerna instead of Boost  Give me some feedback in one week.

## 2021-12-02 NOTE — Patient Instructions (Signed)

## 2021-12-04 ENCOUNTER — Other Ambulatory Visit: Payer: Self-pay

## 2021-12-04 ENCOUNTER — Emergency Department (HOSPITAL_BASED_OUTPATIENT_CLINIC_OR_DEPARTMENT_OTHER): Payer: Medicare HMO

## 2021-12-04 ENCOUNTER — Encounter (HOSPITAL_BASED_OUTPATIENT_CLINIC_OR_DEPARTMENT_OTHER): Payer: Self-pay | Admitting: Emergency Medicine

## 2021-12-04 ENCOUNTER — Inpatient Hospital Stay (HOSPITAL_BASED_OUTPATIENT_CLINIC_OR_DEPARTMENT_OTHER)
Admission: EM | Admit: 2021-12-04 | Discharge: 2021-12-09 | DRG: 065 | Disposition: A | Discharge status: Skilled Nursing Facility | Payer: Medicare HMO | Attending: Internal Medicine | Admitting: Internal Medicine

## 2021-12-04 DIAGNOSIS — B961 Klebsiella pneumoniae [K. pneumoniae] as the cause of diseases classified elsewhere: Secondary | ICD-10-CM | POA: Diagnosis not present

## 2021-12-04 DIAGNOSIS — R338 Other retention of urine: Secondary | ICD-10-CM | POA: Diagnosis present

## 2021-12-04 DIAGNOSIS — I6340 Cerebral infarction due to embolism of unspecified cerebral artery: Secondary | ICD-10-CM | POA: Diagnosis not present

## 2021-12-04 DIAGNOSIS — C787 Secondary malignant neoplasm of liver and intrahepatic bile duct: Secondary | ICD-10-CM | POA: Diagnosis present

## 2021-12-04 DIAGNOSIS — M25552 Pain in left hip: Secondary | ICD-10-CM | POA: Diagnosis present

## 2021-12-04 DIAGNOSIS — I6389 Other cerebral infarction: Secondary | ICD-10-CM | POA: Diagnosis not present

## 2021-12-04 DIAGNOSIS — R29704 NIHSS score 4: Secondary | ICD-10-CM | POA: Diagnosis present

## 2021-12-04 DIAGNOSIS — Q248 Other specified congenital malformations of heart: Secondary | ICD-10-CM

## 2021-12-04 DIAGNOSIS — R531 Weakness: Secondary | ICD-10-CM | POA: Diagnosis not present

## 2021-12-04 DIAGNOSIS — R000 Tachycardia, unspecified: Secondary | ICD-10-CM | POA: Diagnosis not present

## 2021-12-04 DIAGNOSIS — Z8249 Family history of ischemic heart disease and other diseases of the circulatory system: Secondary | ICD-10-CM

## 2021-12-04 DIAGNOSIS — R109 Unspecified abdominal pain: Secondary | ICD-10-CM | POA: Diagnosis not present

## 2021-12-04 DIAGNOSIS — G8929 Other chronic pain: Secondary | ICD-10-CM | POA: Diagnosis present

## 2021-12-04 DIAGNOSIS — E870 Hyperosmolality and hypernatremia: Secondary | ICD-10-CM | POA: Diagnosis present

## 2021-12-04 DIAGNOSIS — I10 Essential (primary) hypertension: Secondary | ICD-10-CM

## 2021-12-04 DIAGNOSIS — R7881 Bacteremia: Secondary | ICD-10-CM | POA: Diagnosis not present

## 2021-12-04 DIAGNOSIS — D849 Immunodeficiency, unspecified: Secondary | ICD-10-CM | POA: Diagnosis not present

## 2021-12-04 DIAGNOSIS — Z79899 Other long term (current) drug therapy: Secondary | ICD-10-CM

## 2021-12-04 DIAGNOSIS — I82452 Acute embolism and thrombosis of left peroneal vein: Secondary | ICD-10-CM | POA: Diagnosis present

## 2021-12-04 DIAGNOSIS — R0602 Shortness of breath: Secondary | ICD-10-CM | POA: Diagnosis not present

## 2021-12-04 DIAGNOSIS — N183 Chronic kidney disease, stage 3 unspecified: Secondary | ICD-10-CM | POA: Diagnosis present

## 2021-12-04 DIAGNOSIS — Z794 Long term (current) use of insulin: Secondary | ICD-10-CM | POA: Diagnosis not present

## 2021-12-04 DIAGNOSIS — E785 Hyperlipidemia, unspecified: Secondary | ICD-10-CM | POA: Diagnosis present

## 2021-12-04 DIAGNOSIS — R404 Transient alteration of awareness: Secondary | ICD-10-CM | POA: Diagnosis not present

## 2021-12-04 DIAGNOSIS — N182 Chronic kidney disease, stage 2 (mild): Secondary | ICD-10-CM | POA: Diagnosis present

## 2021-12-04 DIAGNOSIS — Z20822 Contact with and (suspected) exposure to covid-19: Secondary | ICD-10-CM | POA: Diagnosis not present

## 2021-12-04 DIAGNOSIS — D6869 Other thrombophilia: Secondary | ICD-10-CM | POA: Diagnosis not present

## 2021-12-04 DIAGNOSIS — Z87891 Personal history of nicotine dependence: Secondary | ICD-10-CM

## 2021-12-04 DIAGNOSIS — C229 Malignant neoplasm of liver, not specified as primary or secondary: Secondary | ICD-10-CM

## 2021-12-04 DIAGNOSIS — G8194 Hemiplegia, unspecified affecting left nondominant side: Secondary | ICD-10-CM | POA: Diagnosis present

## 2021-12-04 DIAGNOSIS — C801 Malignant (primary) neoplasm, unspecified: Secondary | ICD-10-CM

## 2021-12-04 DIAGNOSIS — I639 Cerebral infarction, unspecified: Secondary | ICD-10-CM | POA: Diagnosis not present

## 2021-12-04 DIAGNOSIS — E860 Dehydration: Secondary | ICD-10-CM | POA: Diagnosis present

## 2021-12-04 DIAGNOSIS — Z7984 Long term (current) use of oral hypoglycemic drugs: Secondary | ICD-10-CM

## 2021-12-04 DIAGNOSIS — Z8673 Personal history of transient ischemic attack (TIA), and cerebral infarction without residual deficits: Secondary | ICD-10-CM

## 2021-12-04 DIAGNOSIS — N401 Enlarged prostate with lower urinary tract symptoms: Secondary | ICD-10-CM | POA: Diagnosis present

## 2021-12-04 DIAGNOSIS — Z7902 Long term (current) use of antithrombotics/antiplatelets: Secondary | ICD-10-CM

## 2021-12-04 DIAGNOSIS — G9349 Other encephalopathy: Secondary | ICD-10-CM | POA: Diagnosis present

## 2021-12-04 DIAGNOSIS — K5900 Constipation, unspecified: Secondary | ICD-10-CM | POA: Diagnosis not present

## 2021-12-04 DIAGNOSIS — Z7401 Bed confinement status: Secondary | ICD-10-CM | POA: Diagnosis not present

## 2021-12-04 DIAGNOSIS — E871 Hypo-osmolality and hyponatremia: Secondary | ICD-10-CM | POA: Diagnosis present

## 2021-12-04 DIAGNOSIS — I700 Atherosclerosis of aorta: Secondary | ICD-10-CM | POA: Diagnosis not present

## 2021-12-04 DIAGNOSIS — Z833 Family history of diabetes mellitus: Secondary | ICD-10-CM

## 2021-12-04 DIAGNOSIS — Q2112 Patent foramen ovale: Secondary | ICD-10-CM | POA: Diagnosis not present

## 2021-12-04 DIAGNOSIS — I63413 Cerebral infarction due to embolism of bilateral middle cerebral arteries: Principal | ICD-10-CM | POA: Diagnosis present

## 2021-12-04 DIAGNOSIS — D6959 Other secondary thrombocytopenia: Secondary | ICD-10-CM | POA: Diagnosis present

## 2021-12-04 DIAGNOSIS — N179 Acute kidney failure, unspecified: Secondary | ICD-10-CM | POA: Diagnosis not present

## 2021-12-04 DIAGNOSIS — E1165 Type 2 diabetes mellitus with hyperglycemia: Secondary | ICD-10-CM

## 2021-12-04 DIAGNOSIS — E1122 Type 2 diabetes mellitus with diabetic chronic kidney disease: Secondary | ICD-10-CM | POA: Diagnosis present

## 2021-12-04 DIAGNOSIS — C23 Malignant neoplasm of gallbladder: Secondary | ICD-10-CM | POA: Diagnosis not present

## 2021-12-04 DIAGNOSIS — R29818 Other symptoms and signs involving the nervous system: Secondary | ICD-10-CM | POA: Diagnosis not present

## 2021-12-04 DIAGNOSIS — F32A Depression, unspecified: Secondary | ICD-10-CM | POA: Diagnosis present

## 2021-12-04 DIAGNOSIS — Z823 Family history of stroke: Secondary | ICD-10-CM

## 2021-12-04 DIAGNOSIS — G459 Transient cerebral ischemic attack, unspecified: Secondary | ICD-10-CM | POA: Diagnosis not present

## 2021-12-04 DIAGNOSIS — T451X5A Adverse effect of antineoplastic and immunosuppressive drugs, initial encounter: Secondary | ICD-10-CM | POA: Diagnosis present

## 2021-12-04 DIAGNOSIS — I82409 Acute embolism and thrombosis of unspecified deep veins of unspecified lower extremity: Secondary | ICD-10-CM | POA: Diagnosis present

## 2021-12-04 LAB — DIFFERENTIAL
Abs Immature Granulocytes: 0.05 10*3/uL (ref 0.00–0.07)
Basophils Absolute: 0 10*3/uL (ref 0.0–0.1)
Basophils Relative: 0 %
Eosinophils Absolute: 0 10*3/uL (ref 0.0–0.5)
Eosinophils Relative: 0 %
Immature Granulocytes: 1 %
Lymphocytes Relative: 3 %
Lymphs Abs: 0.2 10*3/uL — ABNORMAL LOW (ref 0.7–4.0)
Monocytes Absolute: 0 10*3/uL — ABNORMAL LOW (ref 0.1–1.0)
Monocytes Relative: 1 %
Neutro Abs: 4.7 10*3/uL (ref 1.7–7.7)
Neutrophils Relative %: 95 %
Smear Review: DECREASED

## 2021-12-04 LAB — BASIC METABOLIC PANEL
Anion gap: 12 (ref 5–15)
BUN: 34 mg/dL — ABNORMAL HIGH (ref 8–23)
CO2: 26 mmol/L (ref 22–32)
Calcium: 8.7 mg/dL — ABNORMAL LOW (ref 8.9–10.3)
Chloride: 95 mmol/L — ABNORMAL LOW (ref 98–111)
Creatinine, Ser: 1.54 mg/dL — ABNORMAL HIGH (ref 0.61–1.24)
GFR, Estimated: 45 mL/min — ABNORMAL LOW (ref >60)
Glucose, Bld: 290 mg/dL — ABNORMAL HIGH (ref 70–99)
Potassium: 4.1 mmol/L (ref 3.5–5.1)
Sodium: 133 mmol/L — ABNORMAL LOW (ref 135–145)

## 2021-12-04 LAB — COMPREHENSIVE METABOLIC PANEL
ALT: 21 U/L (ref 0–44)
AST: 31 U/L (ref 15–41)
Albumin: 3.2 g/dL — ABNORMAL LOW (ref 3.5–5.0)
Alkaline Phosphatase: 73 U/L (ref 38–126)
Anion gap: 15 (ref 5–15)
BUN: 42 mg/dL — ABNORMAL HIGH (ref 8–23)
CO2: 22 mmol/L (ref 22–32)
Calcium: 8.8 mg/dL — ABNORMAL LOW (ref 8.9–10.3)
Chloride: 93 mmol/L — ABNORMAL LOW (ref 98–111)
Creatinine, Ser: 2.07 mg/dL — ABNORMAL HIGH (ref 0.61–1.24)
GFR, Estimated: 31 mL/min — ABNORMAL LOW (ref >60)
Glucose, Bld: 467 mg/dL — ABNORMAL HIGH (ref 70–99)
Potassium: 4.5 mmol/L (ref 3.5–5.1)
Sodium: 130 mmol/L — ABNORMAL LOW (ref 135–145)
Total Bilirubin: 0.8 mg/dL (ref 0.3–1.2)
Total Protein: 5.7 g/dL — ABNORMAL LOW (ref 6.5–8.1)

## 2021-12-04 LAB — I-STAT VENOUS BLOOD GAS, ED
Acid-base deficit: 1 mmol/L (ref 0.0–2.0)
Bicarbonate: 23.8 mmol/L (ref 20.0–28.0)
Calcium, Ion: 1.21 mmol/L (ref 1.15–1.40)
HCT: 36 % — ABNORMAL LOW (ref 39.0–52.0)
Hemoglobin: 12.2 g/dL — ABNORMAL LOW (ref 13.0–17.0)
O2 Saturation: 54 %
Patient temperature: 37
Potassium: 4.8 mmol/L (ref 3.5–5.1)
Sodium: 128 mmol/L — ABNORMAL LOW (ref 135–145)
TCO2: 25 mmol/L (ref 22–32)
pCO2, Ven: 41.2 mmHg — ABNORMAL LOW (ref 44.0–60.0)
pH, Ven: 7.371 (ref 7.250–7.430)
pO2, Ven: 29 mmHg — CL (ref 32.0–45.0)

## 2021-12-04 LAB — RAPID URINE DRUG SCREEN, HOSP PERFORMED
Amphetamines: NOT DETECTED
Barbiturates: NOT DETECTED
Benzodiazepines: NOT DETECTED
Cocaine: NOT DETECTED
Opiates: NOT DETECTED
Tetrahydrocannabinol: NOT DETECTED

## 2021-12-04 LAB — GLUCOSE, CAPILLARY
Glucose-Capillary: 237 mg/dL — ABNORMAL HIGH (ref 70–99)
Glucose-Capillary: 402 mg/dL — ABNORMAL HIGH (ref 70–99)

## 2021-12-04 LAB — URINALYSIS, ROUTINE W REFLEX MICROSCOPIC
Bilirubin Urine: NEGATIVE
Glucose, UA: >1000 mg/dL — AB
Ketones, ur: NEGATIVE mg/dL
Leukocytes,Ua: NEGATIVE
Nitrite: NEGATIVE
Specific Gravity, Urine: >1.046 — ABNORMAL HIGH (ref 1.005–1.030)
pH: 5 (ref 5.0–8.0)

## 2021-12-04 LAB — ETHANOL: Alcohol, Ethyl (B): <10 mg/dL (ref <10)

## 2021-12-04 LAB — TSH: TSH: 0.465 u[IU]/mL (ref 0.350–4.500)

## 2021-12-04 LAB — RESP PANEL BY RT-PCR (FLU A&B, COVID) ARPGX2
Influenza A by PCR: NEGATIVE
Influenza B by PCR: NEGATIVE
SARS Coronavirus 2 by RT PCR: NEGATIVE

## 2021-12-04 LAB — CBC
HCT: 33.8 % — ABNORMAL LOW (ref 39.0–52.0)
Hemoglobin: 11.3 g/dL — ABNORMAL LOW (ref 13.0–17.0)
MCH: 28.2 pg (ref 26.0–34.0)
MCHC: 33.4 g/dL (ref 30.0–36.0)
MCV: 84.3 fL (ref 80.0–100.0)
Platelets: 75 10*3/uL — ABNORMAL LOW (ref 150–400)
RBC: 4.01 MIL/uL — ABNORMAL LOW (ref 4.22–5.81)
RDW: 14.9 % (ref 11.5–15.5)
WBC: 4.9 10*3/uL (ref 4.0–10.5)
nRBC: 0 % (ref 0.0–0.2)

## 2021-12-04 LAB — BLOOD GAS, VENOUS
Acid-Base Excess: 2.4 mmol/L — ABNORMAL HIGH (ref 0.0–2.0)
Bicarbonate: 26.6 mmol/L (ref 20.0–28.0)
Drawn by: 4259
FIO2: 21
O2 Saturation: 79.1 %
Patient temperature: 37
pCO2, Ven: 42.9 mmHg — ABNORMAL LOW (ref 44.0–60.0)
pH, Ven: 7.41 (ref 7.250–7.430)
pO2, Ven: 44.4 mmHg (ref 32.0–45.0)

## 2021-12-04 LAB — CBG MONITORING, ED
Glucose-Capillary: 450 mg/dL — ABNORMAL HIGH (ref 70–99)
Glucose-Capillary: 382 mg/dL — ABNORMAL HIGH (ref 70–99)

## 2021-12-04 LAB — C-REACTIVE PROTEIN: CRP: 11.9 mg/dL — ABNORMAL HIGH (ref <1.0)

## 2021-12-04 LAB — PROTIME-INR
INR: 1.2 (ref 0.8–1.2)
Prothrombin Time: 14.7 seconds (ref 11.4–15.2)

## 2021-12-04 LAB — VITAMIN B12: Vitamin B-12: 1821 pg/mL — ABNORMAL HIGH (ref 180–914)

## 2021-12-04 LAB — APTT: aPTT: 28 seconds (ref 24–36)

## 2021-12-04 MED ORDER — STROKE: EARLY STAGES OF RECOVERY BOOK
Freq: Once | Status: AC
  Filled 2021-12-04: qty 1

## 2021-12-04 MED ORDER — SODIUM CHLORIDE 0.9 % IV SOLN: INTRAVENOUS | Status: DC

## 2021-12-04 MED ORDER — IOHEXOL 350 MG/ML SOLN
100.0000 mL | Freq: Once | INTRAVENOUS | Status: AC | PRN
  Administered 2021-12-04: 100 mL via INTRAVENOUS

## 2021-12-04 MED ORDER — ACETAMINOPHEN 325 MG PO TABS
650.0000 mg | ORAL_TABLET | ORAL | Status: DC | PRN
  Administered 2021-12-05 – 2021-12-09 (×11): 650 mg via ORAL
  Filled 2021-12-04 (×14): qty 2

## 2021-12-04 MED ORDER — INSULIN ASPART 100 UNIT/ML IJ SOLN
0.0000 [IU] | Freq: Three times a day (TID) | INTRAMUSCULAR | Status: DC
  Administered 2021-12-05: 3 [IU] via SUBCUTANEOUS
  Administered 2021-12-05 – 2021-12-06 (×2): 5 [IU] via SUBCUTANEOUS
  Administered 2021-12-06: 14:00:00 3 [IU] via SUBCUTANEOUS
  Administered 2021-12-07 (×2): 5 [IU] via SUBCUTANEOUS
  Administered 2021-12-07: 09:00:00 2 [IU] via SUBCUTANEOUS
  Administered 2021-12-08: 3 [IU] via SUBCUTANEOUS
  Administered 2021-12-08 – 2021-12-09 (×3): 5 [IU] via SUBCUTANEOUS
  Administered 2021-12-09 (×2): 3 [IU] via SUBCUTANEOUS

## 2021-12-04 MED ORDER — LACTATED RINGERS IV BOLUS
1000.0000 mL | Freq: Once | INTRAVENOUS | Status: AC
  Administered 2021-12-04: 1000 mL via INTRAVENOUS

## 2021-12-04 MED ORDER — LACTATED RINGERS IV SOLN
INTRAVENOUS | Status: DC
Start: 2021-12-04 — End: 2021-12-04

## 2021-12-04 MED ORDER — ACETAMINOPHEN 160 MG/5ML PO SOLN: 650.0000 mg | ORAL | Status: DC | PRN

## 2021-12-04 MED ORDER — SODIUM CHLORIDE 0.9% FLUSH
10.0000 mL | INTRAVENOUS | Status: DC | PRN
  Administered 2021-12-06: 04:00:00 10 mL

## 2021-12-04 MED ORDER — ENOXAPARIN SODIUM 40 MG/0.4ML IJ SOSY
40.0000 mg | PREFILLED_SYRINGE | INTRAMUSCULAR | Status: DC
  Administered 2021-12-04: 40 mg via SUBCUTANEOUS
  Filled 2021-12-04: qty 0.4

## 2021-12-04 MED ORDER — ATORVASTATIN CALCIUM 40 MG PO TABS
40.0000 mg | ORAL_TABLET | Freq: Every day | ORAL | Status: DC
  Administered 2021-12-05 – 2021-12-09 (×5): 40 mg via ORAL
  Filled 2021-12-04 (×5): qty 1

## 2021-12-04 MED ORDER — INSULIN GLARGINE-YFGN 100 UNIT/ML ~~LOC~~ SOLN
20.0000 [IU] | Freq: Every day | SUBCUTANEOUS | Status: DC
  Administered 2021-12-04 – 2021-12-05 (×2): 20 [IU] via SUBCUTANEOUS
  Filled 2021-12-04: qty 0.2
  Filled 2021-12-04: qty 10
  Filled 2021-12-04 (×2): qty 0.2

## 2021-12-04 MED ORDER — ACETAMINOPHEN 650 MG RE SUPP: 650.0000 mg | RECTAL | Status: DC | PRN

## 2021-12-04 MED ORDER — CHLORHEXIDINE GLUCONATE CLOTH 2 % EX PADS
6.0000 | MEDICATED_PAD | Freq: Every day | CUTANEOUS | Status: DC
  Administered 2021-12-05 – 2021-12-09 (×5): 6 via TOPICAL

## 2021-12-04 MED ORDER — ASPIRIN EC 81 MG PO TBEC: 81.0000 mg | DELAYED_RELEASE_TABLET | Freq: Every day | ORAL | Status: DC

## 2021-12-04 MED ORDER — CLOPIDOGREL BISULFATE 75 MG PO TABS
75.0000 mg | ORAL_TABLET | Freq: Every day | ORAL | Status: DC
  Administered 2021-12-05: 75 mg via ORAL
  Filled 2021-12-04: qty 1

## 2021-12-04 NOTE — Progress Notes (Addendum)
Called by RN.  Pt with sudden onset chills, rigors, sinus tach to 130s, and hypertensive 160/100.  No fever yet, but wondering if hes about to spike one.  Checking lactate and ordering blood cultures.  Update: 5 mins later temp now 99.3  rigors slightly improved, HR 110-120  Pt already given tylenol.

## 2021-12-04 NOTE — ED Provider Notes (Signed)
[facility] EMERGENCY DEPT Provider Note   CSN: [id number] Arrival date & time: 12/04/21  1250     History  No chief complaint on file.   Robert Warner is a 81 y.o. male.  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  The history is provided by the patient and the patient's family who are present bedside.  They state that his last known normal was possibly last night around 9:30 PM.  He woke up this morning with sudden onset dizziness and inability to ambulate.  He has had this problem previously but this is acute and new.  On chart review, he had prior embolic strokes on MRI in November 2022.  He denies any other complaints.  Home Medications Prior to Admission medications   Medication Sig Start Date End Date Taking? Authorizing Provider  Accu-Chek Softclix Lancets lancets Use to test blood sugar levels once a day. 11/22/21   Burchette, Alinda Sierras, MD  acetaminophen (TYLENOL) 650 MG CR tablet Take 650 mg by mouth every 8 (eight) hours as needed for pain.    [provider]  atorvastatin (LIPITOR) 40 MG tablet Take 1 tablet (40 mg total) by mouth daily. 10/19/21 11/18/21  Burchette, Alinda Sierras, MD  Blood Glucose Monitoring Suppl (ACCU-CHEK AVIVA PLUS) w/Device KIT Use to check blood sugar once a day 11/21/21   Burchette, Alinda Sierras, MD  Cholecalciferol (VITAMIN D3) 2000 UNITS TABS Take 2,000 Units by mouth daily.    [provider]  clopidogrel (PLAVIX) 75 MG tablet Take 75 mg by mouth daily.    [provider]  Continuous Blood Gluc Receiver (DEXCOM G6 RECEIVER) DEVI Use to check blood sugar levels as directed. 12/02/21   Burchette, Alinda Sierras, MD  Continuous Blood Gluc Sensor (DEXCOM G6 SENSOR) MISC Use to check blood glucose levels as directed. 12/02/21   Burchette, Alinda Sierras, MD  glucose blood test strip Use as instructed 11/21/21   Burchette,  Alinda Sierras, MD  insulin glargine, 2 Unit Dial, (TOUJEO MAX SOLOSTAR) 300 UNIT/ML Solostar Pen Inject 10 Units into the skin daily. Patient taking differently: Inject 20 Units into the skin daily. 11/09/21   Burchette, Alinda Sierras, MD  insulin lispro (HUMALOG KWIKPEN) 200 UNIT/ML KwikPen Give 5 units  twice daily- with breakfast and supper. 12/02/21   Burchette, Alinda Sierras, MD  lidocaine-prilocaine (EMLA) cream Apply 1 application topically as needed. Apply 1/2 tablespoon to port site 2 hours prior to stick and cover with Press-and-Seal to numb site for access 10/17/21   [name], MD  metFORMIN (GLUCOPHAGE-XR) 750 MG 24 hr tablet TAKE 1 TABLET BY MOUTH EVERY DAY WITH BREAKFAST 11/21/21   Burchette, Alinda Sierras, MD  ondansetron (ZOFRAN) 8 MG tablet Take 1 tablet (8 mg total) by mouth every 8 (eight) hours as needed for nausea or vomiting. Start 72 hours after each IV chemotherapy administration 10/17/21   [name], MD  pantoprazole (PROTONIX) 20 MG tablet TAKE 1 TABLET(20 MG) BY MOUTH DAILY 09/30/21   [name], NP  prochlorperazine (COMPAZINE) 10 MG tablet Take 1 tablet (10 mg total) by mouth every 6 (six) hours as needed for nausea. 10/17/21   [name], MD  silodosin (RAPAFLO) 8 MG CAPS capsule Take 8 mg by mouth at bedtime.    [provider]  traMADol (ULTRAM) 50 MG tablet Take 1 tablet (50  mg total) by mouth every 6 (six) hours as needed for moderate pain. 11/02/21   [name], MD  TRAVATAN Z 0.004 % SOLN ophthalmic solution Place 1 drop into both eyes at bedtime. 11/12/16   [provider]      Allergies    Patient has no known allergies.    Review of Systems   Review of Systems  Musculoskeletal:  Positive for gait problem.  Neurological:  Positive for dizziness.  All other systems reviewed and are negative.  Physical Exam Updated Vital Signs BP 129/89    Pulse 85    Temp 98.1 F (36.7 C) (Oral)    Resp 11    SpO2 100%  Physical Exam Vitals and  nursing note reviewed.  Constitutional:      General: He is not in acute distress.    Appearance: He is well-developed.  HENT:     Head: Normocephalic and atraumatic.  Eyes:     Conjunctiva/sclera: Conjunctivae normal.     Pupils: Pupils are equal, round, and reactive to light.  Cardiovascular:     Rate and Rhythm: Normal rate and regular rhythm.     Heart sounds: No murmur heard. Pulmonary:     Effort: Pulmonary effort is normal. No respiratory distress.     Breath sounds: Normal breath sounds.  Abdominal:     General: There is no distension.     Palpations: Abdomen is soft.     Tenderness: There is no abdominal tenderness. There is no guarding.  Musculoskeletal:        General: No swelling, deformity or signs of injury.     Cervical back: Neck supple.  Skin:    General: Skin is warm and dry.     Capillary Refill: Capillary refill takes less than 2 seconds.     Findings: No lesion or rash.  Neurological:     Mental Status: He is alert.     Comments: MENTAL STATUS EXAM:    Orientation: Alert and oriented to person, place and time.  Memory: Cooperative, follows commands well.  Language: Speech is clear and language is normal.   CRANIAL NERVES:    CN 2 (Optic): Visual fields intact to confrontation.  CN 3,4,6 (EOM): Pupils equal and reactive to light. Full extraocular eye movement without nystagmus.  CN 5 (Trigeminal): Facial sensation is normal, no weakness of masticatory muscles.  CN 7 (Facial): No facial weakness or asymmetry.  CN 8 (Auditory): Auditory acuity grossly normal.  CN 9,10 (Glossophar): The uvula is midline, the palate elevates symmetrically.  CN 11 (spinal access): Normal sternocleidomastoid and trapezius strength.  CN 12 (Hypoglossal): The tongue is midline. No atrophy or fasciculations.Marland Kitchen   MOTOR:  Muscle Strength: 5/5RUE, 5/5LUE, 5/5RLE, 5/5LLE.   COORDINATION:   Bilateral dysmetria on finger to nose.   SENSATION:   Intact to light touch all four  extremities.  GAIT: Gait ataxic   Psychiatric:        Mood and Affect: Mood normal.    ED Results / Procedures / Treatments   Labs (all labs ordered are listed, but only abnormal results are displayed) Labs Reviewed  CBC - Abnormal; Notable for the following components:      Result Value   RBC 4.01 (*)    Hemoglobin 11.3 (*)    HCT 33.8 (*)    Platelets 75 (*)    All other components within normal limits  DIFFERENTIAL - Abnormal; Notable for the following components:   Lymphs Abs 0.2 (*)  Monocytes Absolute 0.0 (*)    All other components within normal limits  COMPREHENSIVE METABOLIC PANEL - Abnormal; Notable for the following components:   Sodium 130 (*)    Chloride 93 (*)    Glucose, Bld 467 (*)    BUN 42 (*)    Creatinine, Ser 2.07 (*)    Calcium 8.8 (*)    Total Protein 5.7 (*)    Albumin 3.2 (*)    GFR, Estimated 31 (*)    All other components within normal limits  URINALYSIS, ROUTINE W REFLEX MICROSCOPIC - Abnormal; Notable for the following components:   Specific Gravity, Urine >1.046 (*)    Glucose, UA >1,000 (*)    Hgb urine dipstick MODERATE (*)    Protein, ur TRACE (*)    Bacteria, UA FEW (*)    All other components within normal limits  CBG MONITORING, ED - Abnormal; Notable for the following components:   Glucose-Capillary 450 (*)    All other components within normal limits  I-STAT VENOUS BLOOD GAS, ED - Abnormal; Notable for the following components:   pCO2, Ven 41.2 (*)    pO2, Ven 29.0 (*)    Sodium 128 (*)    HCT 36.0 (*)    Hemoglobin 12.2 (*)    All other components within normal limits  CBG MONITORING, ED - Abnormal; Notable for the following components:   Glucose-Capillary 382 (*)    All other components within normal limits  RESP PANEL BY RT-PCR (FLU A&B, COVID) ARPGX2  ETHANOL  PROTIME-INR  APTT  RAPID URINE DRUG SCREEN, HOSP PERFORMED  BLOOD GAS, VENOUS  C-REACTIVE PROTEIN  TSH  VITAMIN B12    EKG EKG  Interpretation  Date/Time:  Sunday December 04 2021 13:01:46 EST Ventricular Rate:  100 PR Interval:  155 QRS Duration: 85 QT Interval:  331 QTC Calculation: 427 R Axis:   -53 Text Interpretation: Sinus tachycardia Probable left atrial enlargement Left anterior fascicular block Abnormal R-wave progression, early transition Borderline ST elevation, lateral leads Confirmed by Regan Lemming ([id number]) on 12/04/2021 1:04:03 PM  Radiology CT ANGIO HEAD NECK W WO CM W PERF (CODE STROKE)  Result Date: 12/04/2021 CLINICAL DATA:  Acute neuro deficit. Left-sided weakness. History of metastatic disease. EXAM: CT ANGIOGRAPHY HEAD AND NECK CT PERFUSION BRAIN TECHNIQUE: Multidetector CT imaging of the head and neck was performed using the standard protocol during bolus administration of intravenous contrast. Multiplanar CT image reconstructions and MIPs were obtained to evaluate the vascular anatomy. Carotid stenosis measurements (when applicable) are obtained utilizing NASCET criteria, using the distal internal carotid diameter as the denominator. Multiphase CT imaging of the brain was performed following IV bolus contrast injection. Subsequent parametric perfusion maps were calculated using RAPID software. RADIATION DOSE REDUCTION: This exam was performed according to the departmental dose-optimization program which includes automated exposure control, adjustment of the mA and/or kV according to patient size and/or use of iterative reconstruction technique. CONTRAST:  18m OMNIPAQUE IOHEXOL 350 MG/ML SOLN COMPARISON:  CT head 11/18/2021 FINDINGS: CT HEAD FINDINGS Brain: Generalized atrophy. Chronic microvascular ischemic change throughout the white matter. Chronic infarct right thalamus. Interval contraction of hypodensity in the right cerebellum compatible with resolving infarct. Chronic infarct left superior cerebellum. Hypodensity in the central right cerebellum has progressed from prior studies and may be recent  extension of a previously noted infarct. This measures 1 cm. Chronic infarct right superior cerebellum unchanged Negative for hemorrhage or mass. Vascular: Negative for hyperdense vessel Skull: Negative Sinuses: Paranasal sinuses clear Orbits:  Bilateral cataract extraction. ASPECTS ([place] Stroke Program Early CT Score) - Ganglionic level infarction (caudate, lentiform nuclei, internal capsule, insula, M1-M3 cortex): 7 - Supraganglionic infarction (M4-M6 cortex): 3 Total score (0-10 with 10 being normal): 10 Review of the MIP images confirms the above findings CTA NECK FINDINGS Aortic arch: Normal aortic arch. Proximal great vessels widely patent. Bovine branching arch. Right carotid system: Right carotid widely patent without stenosis or atherosclerotic disease. Left carotid system: Left carotid widely patent. Negative for atherosclerotic disease or stenosis Vertebral arteries: Both vertebral arteries widely patent to the skull base. No significant stenosis. Skeleton: Degenerative changes in the cervical spine. No acute skeletal abnormality. Other neck: Negative for mass or edema in the neck. Port-A-Cath tip right jugular vein. Upper chest: 8.5 mm spiculated nodule right upper lobe unchanged from PET-CT 10/10/2021. No other lung nodule in the apices. Review of the MIP images confirms the above findings CTA HEAD FINDINGS Anterior circulation: Cavernous carotid widely patent bilaterally. Anterior and middle cerebral arteries widely patent bilaterally. No stenosis or vascular malformation. Posterior circulation: Both vertebral arteries patent to the basilar. Basilar widely patent. PICA patent bilaterally. AICA, superior cerebellar, posterior cerebral arteries patent without stenosis or large vessel occlusion. Negative for cerebrovascular malformation. Venous sinuses: Normal venous enhancement Anatomic variants: None Review of the MIP images confirms the above findings CT Brain Perfusion Findings: ASPECTS: 10 CBF  (<30%) Volume: 416m Perfusion (Tmax>6.0s) volume: 06mMismatch Volume: 16m54mnfarction Location:None IMPRESSION: 1. CT perfusion negative for acute infarct or ischemia 2. Negative for intracranial large vessel occlusion 3. No carotid or vertebral artery stenosis in the neck. No significant atherosclerotic disease. 4. CT head demonstrates new hypodensity right mid cerebellum. This could represent interval acute infarct or possibly metastatic disease given history. Follow-up MRI brain without and with contrast recommended. Additional chronic ischemic changes stable from prior studies. 5. 8.5 mm spiculated nodule right upper lobe, stable from prior PET-CT 10/10/2021 Electronically Signed   By: ChaFranchot GalloD.   On: 12/04/2021 14:08    Procedures Procedures    Medications Ordered in ED Medications  insulin glargine-yfgn (SEMGLEE) injection 20 Units (20 Units Subcutaneous Given 12/04/21 1525)  lactated ringers bolus 1,000 mL (0 mLs Intravenous Stopped 12/04/21 1959)  iohexol (OMNIPAQUE) 350 MG/ML injection 100 mL (100 mLs Intravenous Contrast Given 12/04/21 1341)    ED Course/ Medical Decision Making/ A&P Clinical Course as of 12/04/21 2027  Sun Dec 04, 2021  1308 Glucose-Capillary(!): 450 [JL]    Clinical Course User Index [JL] LawRegan LemmingD                           Medical Decision Making Amount and/or Complexity of Data Reviewed Labs: ordered. Decision-making details documented in ED Course. Radiology: ordered.  Risk Prescription drug management. Decision regarding hospitalization.   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  The history is provided by the patient and the patient's family who are present bedside.  They state that his last known normal was possibly last night around 9:30 PM.  He woke up this morning with sudden onset dizziness and inability to  ambulate.  He has had this problem previously but this is acute and new.  On chart review, he had prior embolic strokes on MRI in November 2022.  He denies any other complaints.  On arrival, the patient was afebrile,  hemodynamically stable, normal sinus rhythm noted on cardiac telemetry.  Physical exam significant for dysmetria bilaterally and ataxic gait.  Differential diagnosis includes new CVA, stroke record essence in the setting of the patient's initial blood glucose which was 450 on arrival.   Code stroke was called and teleneurology was consulted.  IV access was obtained and the patient was administered IV fluid bolus for his hyperglycemia.  I-STAT BMP did not reveal an anion gap acidosis and a blood gas revealed a normal pH of 7.37 with a bicarbonate of 23.8.  Low concern for DKA at this time.  The patient was administered 20 units of insulin glargine which is his home dose.   CTA imaging was negative for LVO with a new hypodensity in the right mid cerebellum.  The radiology read mention possible acute infarct versus possibly metastatic disease.  An 8.5 mm spiculated nodule was noted in the right upper lobe, stable from prior PET on 11/22.  Following evaluation, Dr. Posey Pronto of teleneurology recommended admission for further work-up to include MRI brain which was ordered.  Dr. Eliberto Ivory of hospitalist medicine then was consulted for admission and the patient was subsequently admitted in stable condition.  Plan for transfer to [facility] for further stroke work-up.   Final Clinical Impression(s) / ED Diagnoses Final diagnoses:  Cerebrovascular accident (CVA), unspecified mechanism [facility])    Rx / [place] Orders ED Discharge Orders     None         Regan Lemming, MD 12/04/21 2030

## 2021-12-04 NOTE — Assessment & Plan Note (Signed)
Hold home BP meds (not that I actually see any on his current med-rec) in setting of suspected acute stroke and allow permissive hypertension.

## 2021-12-04 NOTE — Significant Event (Signed)
Rapid Response Event Note   Reason for Call :  "Restless, shivering"  Initial Focused Assessment:  Pt laying in bed, eyes open with several blankets. Pt visibly showing signs of chills and rigors. Pt A/O complaining of left lower abdominal pain, trying to get comfortable. Bedside RN gave PRN Tylenol prior to arrival.  VS: HR 130s (ST), BP 164/102, O2 97% on RA, Temp 98.6. Reports pain 6/10  Repeat oral temp 30 mins later 99.3. Repeat VS BP 143/102, HR 114, O2 97%. Pt states he is feeling slightly better. Shivering occasionally.   Interventions:  MD notified by bedside RN   Plan of Care:  MD ordered lactate and blood cultures Continue to monitor pts temp Call RRT for further assistance or concerns.  Event Summary:   MD Notified: Alcario Drought, MD Call Time: 2334 Arrival Time: 2337 End Time: 0004  Mervyn Gay, RN

## 2021-12-04 NOTE — Assessment & Plan Note (Addendum)
Hypodensity on CT most worrisome for new acute embolic stroke.  Concerned for embolic stroke given hypercoagulable state (from metastatic CA), known R to L atrial shunt. 1. Neuro consult 2. Stroke pathway 3. Tele monitor 4. MRI brain 5. 2d echo 6. Tele monitor 7. PT/OT/SLP 8. Continue plavix for now 9. If embolic source (clot) is found, then switch to [facility] instead, for the moment however will just use DVT prophylaxis lovenox 1. Mild thrombocytopenia noted 2. Despite this, feel that risks/benefits would favor at least DVT ppx lovenox in setting of this pt with metastatic cancer who is suspected to be having thromboembolic events 3. Discussed with Dr. Leonel Ramsay 10. LE [place] to r/o DVT 1. If neg, consider MRV abd/pelvis to look for other embolic sources

## 2021-12-04 NOTE — Assessment & Plan Note (Signed)
Creat of 2.0 today up from 1.2 on 1/18 (4 days ago).  BUN:Cr ratio of 20:1 is most c/w pre-renal / ATN.  Additionally suspect dehydration present given Hyponatremia and very concentrated urine (specific gravity elevated on today's labs). 1. Got 1L LR in ED 2. NS at 100cc/hr for tonight 3. Strict intake and output 4. See hypernatremia below as well

## 2021-12-04 NOTE — Assessment & Plan Note (Signed)
Embolic stroke(s) in Nov in setting of confirmed Interatrial cardiac shunt and hypercoagulable state due to metastatic cancer.

## 2021-12-04 NOTE — Assessment & Plan Note (Signed)
Most likely dehydration (hypovolemic hyponatremia) given AKI in pre-renal pattern, very high urine specific gravity.  DDx includes new onset SIADH in setting of metastatic CA 1. Check Urine sodium 2. Check BMP Q6H 3. Normal saline for the moment (unless SIADH confirmed with elevated urine sodium, in which case will need to stop this).

## 2021-12-04 NOTE — H&P (Addendum)
History and Physical    PatientDSHAWN Warner BCW:[id number] DOB: 1941/07/02 DOA: 12/04/2021 DOS: the patient was seen and examined on 12/04/2021 PCP: [name], MD  Patient coming from: Home  Chief Complaint: Off Balance / dizziness  HPI: Robert Warner is a 81 y.o. 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  Pt with recent embolic strokes in Nov to cerebellum.  Medium sized R to L shunt of atrium noted on 2D echo at that time.  [place] LE neg for DVT at that time.  Pt has been on Plavix.  Brain CT 11/18/2021 new "lesion" in L cerebellum compared to MRI 09/16/21 Subacute infarct is suspected but met not excluded.  Today pt in to ED with new onset dizziness.  He woke up this morning with sudden onset dizziness and inability to ambulate.   Per pt and family: LKW last night around 930pm,   Review of Systems: As mentioned in the history of present illness. All other systems reviewed and are negative. Past Medical History:  Diagnosis Date   Arthritis    Cancer ([place])    Cataract    Diabetes mellitus    Hyperlipidemia    Stroke [facility])    Past Surgical History:  Procedure Laterality Date   APPENDECTOMY  1968   CATARACT EXTRACTION Bilateral 07/2000   states 2 weeks ago (first of sept) OD due in Dec    COLONOSCOPY  2004,2009   Negative, Dr. Sharlett Iles    IR IMAGING GUIDED PORT INSERTION  10/14/2021   KIDNEY STONE SURGERY  10/2018   PROSTATE BIOPSY  2006   Robert Warner   Social History:  reports that he quit smoking about 55 years ago. His smoking use included cigarettes. He has a 5.00 pack-year smoking history. He has never used smokeless tobacco. He reports that he does not currently use alcohol after a past usage of about 2.0 standard drinks per week. He reports that he does not use drugs.  No Known Allergies  Family History   Problem Relation Age of Onset   Hypertension Mother    Coronary artery disease Mother    Stroke Father 9   Stroke Sister    Pancreatic cancer Brother    Diabetes Maternal Aunt    Coronary artery disease Maternal Uncle        2 Maternal Uncles    Diabetes Maternal Uncle    Breast cancer Paternal Aunt    Coronary artery disease Paternal Aunt    Coronary artery disease Maternal Grandmother    Stroke Paternal Grandmother    Heart disease Neg Hx    Colon cancer Neg Hx    Esophageal cancer Neg Hx    Rectal cancer Neg Hx    Stomach cancer Neg Hx     Prior to Admission medications   Medication Sig Start Date End Date Taking? Authorizing Provider  Accu-Chek Softclix Lancets lancets Use to test blood sugar levels once a day. 11/22/21   Burchette, Alinda Sierras, MD  acetaminophen (TYLENOL) 650 MG CR tablet Take 650 mg by mouth every 8 (eight) hours as needed for pain.    [provider]  atorvastatin (LIPITOR) 40 MG tablet Take 1 tablet (40 mg total) by mouth daily. 10/19/21 11/18/21  Burchette, Alinda Sierras, MD  Blood Glucose Monitoring Suppl (ACCU-CHEK AVIVA PLUS) w/Device KIT Use to check blood sugar once a  day 11/21/21   [name], MD  Cholecalciferol (VITAMIN D3) 2000 UNITS TABS Take 2,000 Units by mouth daily.    [provider]  clopidogrel (PLAVIX) 75 MG tablet Take 75 mg by mouth daily.    [provider]  Continuous Blood Gluc Receiver (DEXCOM G6 RECEIVER) DEVI Use to check blood sugar levels as directed. 12/02/21   Burchette, Alinda Sierras, MD  Continuous Blood Gluc Sensor (DEXCOM G6 SENSOR) MISC Use to check blood glucose levels as directed. 12/02/21   Burchette, Alinda Sierras, MD  glucose blood test strip Use as instructed 11/21/21   Burchette, Alinda Sierras, MD  insulin glargine, 2 Unit Dial, (TOUJEO MAX SOLOSTAR) 300 UNIT/ML Solostar Pen Inject 10 Units into the skin daily. Patient taking differently: Inject 20 Units into the skin daily. 11/09/21   Burchette, Alinda Sierras, MD   insulin lispro (HUMALOG KWIKPEN) 200 UNIT/ML KwikPen Give 5 units [place] twice daily- with breakfast and supper. 12/02/21   Burchette, Alinda Sierras, MD  lidocaine-prilocaine (EMLA) cream Apply 1 application topically as needed. Apply 1/2 tablespoon to port site 2 hours prior to stick and cover with Press-and-Seal to numb site for access 10/17/21   [name], MD  metFORMIN (GLUCOPHAGE-XR) 750 MG 24 hr tablet TAKE 1 TABLET BY MOUTH EVERY DAY WITH BREAKFAST 11/21/21   Burchette, Alinda Sierras, MD  ondansetron (ZOFRAN) 8 MG tablet Take 1 tablet (8 mg total) by mouth every 8 (eight) hours as needed for nausea or vomiting. Start 72 hours after each IV chemotherapy administration 10/17/21   [name], MD  pantoprazole (PROTONIX) 20 MG tablet TAKE 1 TABLET(20 MG) BY MOUTH DAILY 09/30/21   [name], NP  prochlorperazine (COMPAZINE) 10 MG tablet Take 1 tablet (10 mg total) by mouth every 6 (six) hours as needed for nausea. 10/17/21   [name], MD  silodosin (RAPAFLO) 8 MG CAPS capsule Take 8 mg by mouth at bedtime.    [provider]  traMADol (ULTRAM) 50 MG tablet Take 1 tablet (50 mg total) by mouth every 6 (six) hours as needed for moderate pain. 11/02/21   [name], MD  TRAVATAN Z 0.004 % SOLN ophthalmic solution Place 1 drop into both eyes at bedtime. 11/12/16   [provider]    Physical Exam: Vitals:   12/04/21 1930 12/04/21 2005 12/04/21 2049 12/04/21 2114  BP: 129/89  129/84   Pulse: 85  86   Resp: 11  16   Temp:  98.1 F (36.7 C) 98.2 F (36.8 C)   TempSrc:  Oral Oral   SpO2: 100%  98%   Weight:   81.7 kg   Height:    _0  (1.778 m)   Constitutional: NAD, calm, comfortable Eyes: PERRL, lids and conjunctivae normal ENMT: Mucous membranes are moist. Posterior pharynx clear of any exudate or lesions.Normal dentition.  Neck: normal, supple, no masses, no thyromegaly Respiratory: clear to auscultation bilaterally, no wheezing, no crackles. Normal  respiratory effort. No accessory muscle use.  Cardiovascular: Regular rate and rhythm, no murmurs / rubs / gallops. No extremity edema. 2+ pedal pulses. No carotid bruits.  Abdomen: no tenderness, no masses palpated. No hepatosplenomegaly. Bowel sounds positive.  Musculoskeletal: no clubbing / cyanosis. No joint deformity upper and lower extremities. Good ROM, no contractures. Normal muscle tone.  Skin: no rashes, lesions, ulcers. No induration Neurologic: Dysmetria bilaterally on finger to nose, ataxic gait Psychiatric: Normal judgment and insight. Alert and oriented x 3. Normal mood.  Data Reviewed:  CT head demonstrates new hypodensity in R cerebellum worrisome for acute stroke when compared to earlier this month.  DDx includes new met. Creat 2.0 up from 1.2 on 1/18 Sodium 130 Urine specific gravity elevated  Assessment/Plan * Acute embolic stroke ([place])- (present on admission) Hypodensity on CT most worrisome for new acute embolic stroke.  Concerned for embolic stroke given hypercoagulable state (from metastatic CA), known R to L atrial shunt. Neuro consult Stroke pathway Tele monitor MRI brain 2d echo Tele monitor PT/OT/SLP Continue plavix for now If embolic source (clot) is found, then switch to [facility] instead, for the moment however will just use DVT prophylaxis lovenox Mild thrombocytopenia noted Despite this, feel that risks/benefits would favor at least DVT ppx lovenox in setting of this pt with metastatic cancer who is suspected to be having thromboembolic events Discussed with Dr. Leonel Ramsay LE [place] to r/o DVT If neg, consider MRV abd/pelvis to look for other embolic sources  Hyponatremia- (present on admission) Most likely dehydration (hypovolemic hyponatremia) given AKI in pre-renal pattern, very high urine specific gravity.  DDx includes new onset SIADH in setting of metastatic CA Check Urine sodium Check BMP Q6H Normal saline for the moment (unless SIADH confirmed  with elevated urine sodium, in which case will need to stop this).  AKI (acute kidney injury) ([place])- (present on admission) Creat of 2.0 today up from 1.2 on 1/18 (4 days ago).  BUN:Cr ratio of 20:1 is most c/w pre-renal / ATN.  Additionally suspect dehydration present given Hyponatremia and very concentrated urine (specific gravity elevated on today's labs). Got 1L LR in ED NS at 100cc/hr for tonight Strict intake and output See hypernatremia below as well  Cancer with unknown primary site [facility])- (present on admission) Got 4th cycle of FOLFOX on 1/18 Getting MRI brain to differentiate those hypo densities (stroke vs new mets) Adding [name] to treatment team so patient shows up on his list as per our routine when admitting an active chemo patient.  Interatrial cardiac shunt Confirmed on 2d echo in Nov.  See Stroke discussion above.  Recent cerebrovascular accident (CVA) Embolic stroke(s) in Nov in setting of confirmed Interatrial cardiac shunt and hypercoagulable state due to metastatic cancer.  Essential hypertension- (present on admission) Hold home BP meds (not that I actually see any on his current med-rec) in setting of suspected acute stroke and allow permissive hypertension.  Type 2 diabetes mellitus with hyperglycemia (HCC)- (present on admission) Continue home Lantus 20u QHS Mod scale SSI AC Hold metformin    Advance Care Planning:   Code Status: Full Code  Consults: Neuro  Family Communication: No family in room  Severity of Illness: The appropriate patient status for this patient is OBSERVATION. Observation status is judged to be reasonable and necessary in order to provide the required intensity of service to ensure the patient's safety. The patient's presenting symptoms, physical exam findings, and initial radiographic and laboratory data in the context of their medical condition is felt to place them at decreased risk for further clinical deterioration.  Furthermore, it is anticipated that the patient will be medically stable for discharge from the hospital within 2 midnights of admission.   Author: Etta Quill., DO 12/04/2021 10:26 PM  For on call review [email or web address].

## 2021-12-04 NOTE — Assessment & Plan Note (Signed)
Confirmed on 2d echo in Nov.  See Stroke discussion above.

## 2021-12-04 NOTE — Consult Note (Signed)
[facility] TeleNeurology Consult Services   Patient Name:   Robert Warner, Robert Warner Date of Birth:   [birth year] Identification Number:   MRN - [id number] Date of Service:   12/04/2021 13:23:19  Diagnosis: I63.9 - Cerebrovascular accident (CVA), unspecified mechanism ([place]) G93.49 - Encephalopathy Multifactorial  Impression: Pt is a 81 YOM with PMH of CANCER, HLD, DM II, prior embolic CVA who presented with off balance, left arms, fall, UI. NIHSS: 1. His last known normal not clear. CTAs negative for LVO. CT head read as right cerebellar hypodensities, but were possibly present on MRI from November 2022. Suspect he had hypercoag state from malignancy or other cardio emoblic process and possibly causing embolic strokes. He is uremic/hyperglycemi/hyponatremic as well today. Admit for further workup.    Monitor neuro checks/VS q4h with telemetry.  Recommend fall precautions and seizure precautions.  Goal SBP b/w 100-140.  Start PLAVIX + STATIN if no contraindications.  Get MRI BRAIN W/O (get W/ after renal function normalizes), and ECHO.  Get COVID, UDS, ETOH, ESR/CRP, TROP, CK, TSH, B12, BNP, LACTIC ACID, LIPID PANEL, and A1C.  Get WORKUP for TOXIC/METABOLIC/INFECTIOUS causes.  PT/OT/ST eval.    Metrics: Last Known Well: 12/03/2021 21:30:00 [facility] Notification Time: 12/04/2021 13:22:17 Arrival Time: 12/04/2021 12:50:00 Stamp Time: 12/04/2021 13:23:19 Initial Response Time: 12/04/2021 13:35:45 Symptoms: off balance, left arms, fall, UI. NIHSS Start Assessment Time: 12/04/2021 13:47:27 Patient is not a candidate for Thrombolytic. Thrombolytic Medical Decision: 12/04/2021 14:10:56 Patient was not deemed candidate for Thrombolytic because of following reasons: Last Well Known Above 4.5 Hours. Stroke in last 3 months.  I personally Reviewed the CT Head and it Showed  CTA:  1. CT perfusion negative for acute infarct or ischemia 2. Negative for intracranial large vessel  occlusion 3. No carotid or vertebral artery stenosis in the neck. No significant atherosclerotic disease. 4. CT head demonstrates new hypodensity right mid cerebellum. This could represent interval acute infarct or possibly metastatic disease given history. Follow-up MRI brain without and with contrast recommended. Additional chronic ischemic changes stable from prior studies. 5. 8.5 mm spiculated nodule right upper lobe, stable from prior PET-CT 10/10/2021  ED Physician notified of diagnostic impression and management plan on 12/04/2021 14:30:00  Advanced Imaging: CTA Head and Neck Completed.  LVO:No  Patient doesn't meet criteria for emergent NIR consideration   Our recommendations are outlined below.  Recommendations:        Stroke/Telemetry Floor       Neuro Checks       Bedside Swallow Eval       DVT Prophylaxis       IV Fluids, Normal Saline       Head of Bed 30 Degrees       Euglycemia and Avoid Hyperthermia (PRN Acetaminophen)  Routine Consultation with [facility] for Follow up Care  Sign Out:       Discussed with Emergency Department Provider    ------------------------------------------------------------------------------  History of Present Illness: Patient is a 81 year old Male.  Patient was brought by EMS for symptoms of off balance, left arms, fall, UI. Pt is a 81 YOM with PMH of CANCER, HLD, DM II, prior embolic CVA who presented with off balance, left arms, fall, UI. He had emoblic strokes in b/l cerebral/cerebellar hemispheres, and brainstem in November 2022. He had a CT head on 11/18/21 for episode of confusion. He had a fall around 0400-0500 and found on floor by wife, and couldn't get up. He had left arm weakness on exam in ED.  Past Medical History:      Diabetes Mellitus      Hyperlipidemia      Stroke      There is no history of Atrial Fibrillation      There is no history of Coronary Artery Disease      There is no history of Covid-19       There is no history of Seizures      There is no history of Migraine Headaches  Medications:  Anticoagulant use:  Unknown Antiplatelet use: Yes PLAVIX Reviewed EMR for current medications  Allergies:  Reviewed  Social History: Smoking: No Alcohol Use: No Drug Use: No  Family History:  There is no family history of premature cerebrovascular disease pertinent to this consultation  ROS : 14 Points Review of Systems was performed and was negative except mentioned in HPI.  Past Surgical History: There Is No Surgical History Contributory To Todays Visit    Examination: BP(140/88), Pulse(98), Blood Glucose(450) 1A: Level of Consciousness - Alert; keenly responsive + 0 1B: Ask Month and Age - Both Questions Right + 0 1C: Blink Eyes & Squeeze Hands - Performs Both Tasks + 0 2: Test Horizontal Extraocular Movements - Normal + 0 3: Test Visual Fields - No Visual Loss + 0 4: Test Facial Palsy (Use [place]imace if Obtunded) - Normal symmetry + 0 5A: Test Left Arm Motor Drift - Drift, but doesn't hit bed + 1 5B: Test Right Arm Motor Drift - No Drift for 10 Seconds + 0 6A: Test Left Leg Motor Drift - No Drift for 5 Seconds + 0 6B: Test Right Leg Motor Drift - No Drift for 5 Seconds + 0 7: Test Limb Ataxia (FNF/Heel-Shin) - No Ataxia + 0 8: Test Sensation - Normal; No sensory loss + 0 9: Test Language/Aphasia - Normal; No aphasia + 0 10: Test Dysarthria - Normal + 0 11: Test Extinction/Inattention - No abnormality + 0  NIHSS Score: 1   Pre-Morbid Modified Rankin Scale: Unable to assess   Patient/Family was informed the Neurology Consult would occur via TeleHealth consult by way of interactive audio and video telecommunications and consented to receiving care in this manner.   Patient is being evaluated for possible acute neurologic impairment and high probability of imminent or life-threatening deterioration. I spent total of 50 minutes providing care to this patient,  including time for face to face visit via telemedicine, review of medical records, imaging studies and discussion of findings with providers, the patient and/or family.   Dr Currie Paris   [facility] [phone]   Case [id number]

## 2021-12-04 NOTE — Assessment & Plan Note (Signed)
1. Continue home Lantus 20u QHS 2. Mod scale SSI AC 3. Hold metformin

## 2021-12-04 NOTE — Assessment & Plan Note (Signed)
1. Got 4th cycle of FOLFOX on 1/18 2. Getting MRI brain to differentiate those hypo densities (stroke vs new mets) 3. Adding [name] to treatment team so patient shows up on his list as per our routine when admitting an active chemo patient.

## 2021-12-04 NOTE — Progress Notes (Signed)
Received a phone call from Facility: [facility]   Requesting MD: Dr. Armandina Gemma  Patient with h/o T2DM, HTN, hx of embolic stroke, HLD, CKD stage 3, unknown primary cancer presenting with feeling off balance from baseline. LKW around 9:30pm. Concern for new stroke. Already on ASA and plavix.   Vitals stable.  Pertinent labs: platelets 75, sodium 130 but sugar 467, BUN 42, creatinine 2.07 (1.0-1.2),  CTA head/neck: negative for large vessel occlusion. New hypodensity in right mid cerebellum. ? Acute infarct vs. Possibly metastatic dx. 8.80mm spiculated nodule in RUL. Stable from PET 11/22  In ED code stroke activated, neurology consulted. Given 1L IVF   Plan of care: place on tele, I ordered labs requested by neuro except for a1c/lipid panel. Will need stroke w/u and let neurology know when he is here. Brain MRI order in . Asked for EDP to give home long acting insulin as well.    The patient will be accepted for admission to telemetry at [facility] when bed is available.   Nursing staff, Please call the [place] number at the top of Amion at the time of the patient's arrival so that the patient can be paged to the admitting physician.   Casimer Bilis, M.D. [facility]

## 2021-12-05 ENCOUNTER — Observation Stay (HOSPITAL_COMMUNITY): Payer: Medicare HMO

## 2021-12-05 ENCOUNTER — Telehealth: Payer: Self-pay | Admitting: *Deleted

## 2021-12-05 ENCOUNTER — Ambulatory Visit: Payer: Medicare HMO | Admitting: Physical Therapy

## 2021-12-05 ENCOUNTER — Observation Stay (HOSPITAL_BASED_OUTPATIENT_CLINIC_OR_DEPARTMENT_OTHER): Payer: Medicare HMO

## 2021-12-05 DIAGNOSIS — R109 Unspecified abdominal pain: Secondary | ICD-10-CM | POA: Diagnosis not present

## 2021-12-05 DIAGNOSIS — I6389 Other cerebral infarction: Secondary | ICD-10-CM | POA: Diagnosis not present

## 2021-12-05 DIAGNOSIS — I639 Cerebral infarction, unspecified: Secondary | ICD-10-CM

## 2021-12-05 DIAGNOSIS — R0602 Shortness of breath: Secondary | ICD-10-CM | POA: Diagnosis not present

## 2021-12-05 DIAGNOSIS — I6340 Cerebral infarction due to embolism of unspecified cerebral artery: Secondary | ICD-10-CM | POA: Diagnosis not present

## 2021-12-05 DIAGNOSIS — N179 Acute kidney failure, unspecified: Secondary | ICD-10-CM | POA: Diagnosis not present

## 2021-12-05 DIAGNOSIS — I82452 Acute embolism and thrombosis of left peroneal vein: Secondary | ICD-10-CM

## 2021-12-05 DIAGNOSIS — I10 Essential (primary) hypertension: Secondary | ICD-10-CM | POA: Diagnosis not present

## 2021-12-05 DIAGNOSIS — Q2112 Patent foramen ovale: Secondary | ICD-10-CM

## 2021-12-05 DIAGNOSIS — R29818 Other symptoms and signs involving the nervous system: Secondary | ICD-10-CM | POA: Diagnosis not present

## 2021-12-05 DIAGNOSIS — I700 Atherosclerosis of aorta: Secondary | ICD-10-CM | POA: Diagnosis not present

## 2021-12-05 DIAGNOSIS — M25552 Pain in left hip: Secondary | ICD-10-CM | POA: Diagnosis not present

## 2021-12-05 DIAGNOSIS — R531 Weakness: Secondary | ICD-10-CM | POA: Diagnosis not present

## 2021-12-05 LAB — BASIC METABOLIC PANEL
Anion gap: 9 (ref 5–15)
BUN: 31 mg/dL — ABNORMAL HIGH (ref 8–23)
CO2: 25 mmol/L (ref 22–32)
Calcium: 8.2 mg/dL — ABNORMAL LOW (ref 8.9–10.3)
Chloride: 99 mmol/L (ref 98–111)
Creatinine, Ser: 1.42 mg/dL — ABNORMAL HIGH (ref 0.61–1.24)
GFR, Estimated: 49 mL/min — ABNORMAL LOW (ref >60)
Glucose, Bld: 150 mg/dL — ABNORMAL HIGH (ref 70–99)
Potassium: 3.6 mmol/L (ref 3.5–5.1)
Sodium: 133 mmol/L — ABNORMAL LOW (ref 135–145)

## 2021-12-05 LAB — C-REACTIVE PROTEIN: CRP: 16.3 mg/dL — ABNORMAL HIGH (ref <1.0)

## 2021-12-05 LAB — CBC
HCT: 32.7 % — ABNORMAL LOW (ref 39.0–52.0)
Hemoglobin: 11.2 g/dL — ABNORMAL LOW (ref 13.0–17.0)
MCH: 28.7 pg (ref 26.0–34.0)
MCHC: 34.3 g/dL (ref 30.0–36.0)
MCV: 83.8 fL (ref 80.0–100.0)
Platelets: 56 10*3/uL — ABNORMAL LOW (ref 150–400)
RBC: 3.9 MIL/uL — ABNORMAL LOW (ref 4.22–5.81)
RDW: 14.9 % (ref 11.5–15.5)
WBC: 5.1 10*3/uL (ref 4.0–10.5)
nRBC: 0 % (ref 0.0–0.2)

## 2021-12-05 LAB — BLOOD CULTURE ID PANEL (REFLEXED) - BCID2

## 2021-12-05 LAB — LIPID PANEL
Cholesterol: 118 mg/dL (ref 0–200)
HDL: 35 mg/dL — ABNORMAL LOW (ref >40)
LDL Cholesterol: 59 mg/dL (ref 0–99)
Total CHOL/HDL Ratio: 3.4 RATIO
Triglycerides: 120 mg/dL (ref <150)
VLDL: 24 mg/dL (ref 0–40)

## 2021-12-05 LAB — ECHOCARDIOGRAM LIMITED
Area-P 1/2: 2.96 cm2
Height: 70 in
S' Lateral: 3.3 cm
Single Plane A2C EF: 37.9 %
Weight: 2881.85 oz

## 2021-12-05 LAB — GLUCOSE, CAPILLARY
Glucose-Capillary: 101 mg/dL — ABNORMAL HIGH (ref 70–99)
Glucose-Capillary: 193 mg/dL — ABNORMAL HIGH (ref 70–99)
Glucose-Capillary: 205 mg/dL — ABNORMAL HIGH (ref 70–99)
Glucose-Capillary: 198 mg/dL — ABNORMAL HIGH (ref 70–99)

## 2021-12-05 LAB — HEMOGLOBIN A1C
Hgb A1c MFr Bld: 11.9 % — ABNORMAL HIGH (ref 4.8–5.6)
Mean Plasma Glucose: 295 mg/dL

## 2021-12-05 LAB — BRAIN NATRIURETIC PEPTIDE: B Natriuretic Peptide: 181.6 pg/mL — ABNORMAL HIGH (ref 0.0–100.0)

## 2021-12-05 LAB — LACTIC ACID, PLASMA
Lactic Acid, Venous: 4.2 mmol/L (ref 0.5–1.9)
Lactic Acid, Venous: 1.8 mmol/L (ref 0.5–1.9)

## 2021-12-05 LAB — MAGNESIUM: Magnesium: 1.6 mg/dL — ABNORMAL LOW (ref 1.7–2.4)

## 2021-12-05 LAB — PROCALCITONIN: Procalcitonin: 40.42 ng/mL

## 2021-12-05 IMAGING — DX DG CHEST 1V PORT
1 series · 1 of 1 positions shown · non-contrast
Comparison: Chest radiographs [DATE] and earlier.

CLINICAL DATA: 80-year-old male with shortness of breath.
Metastatic disease unknown primary.

EXAM:
PORTABLE CHEST 1 VIEW

[chest ap]
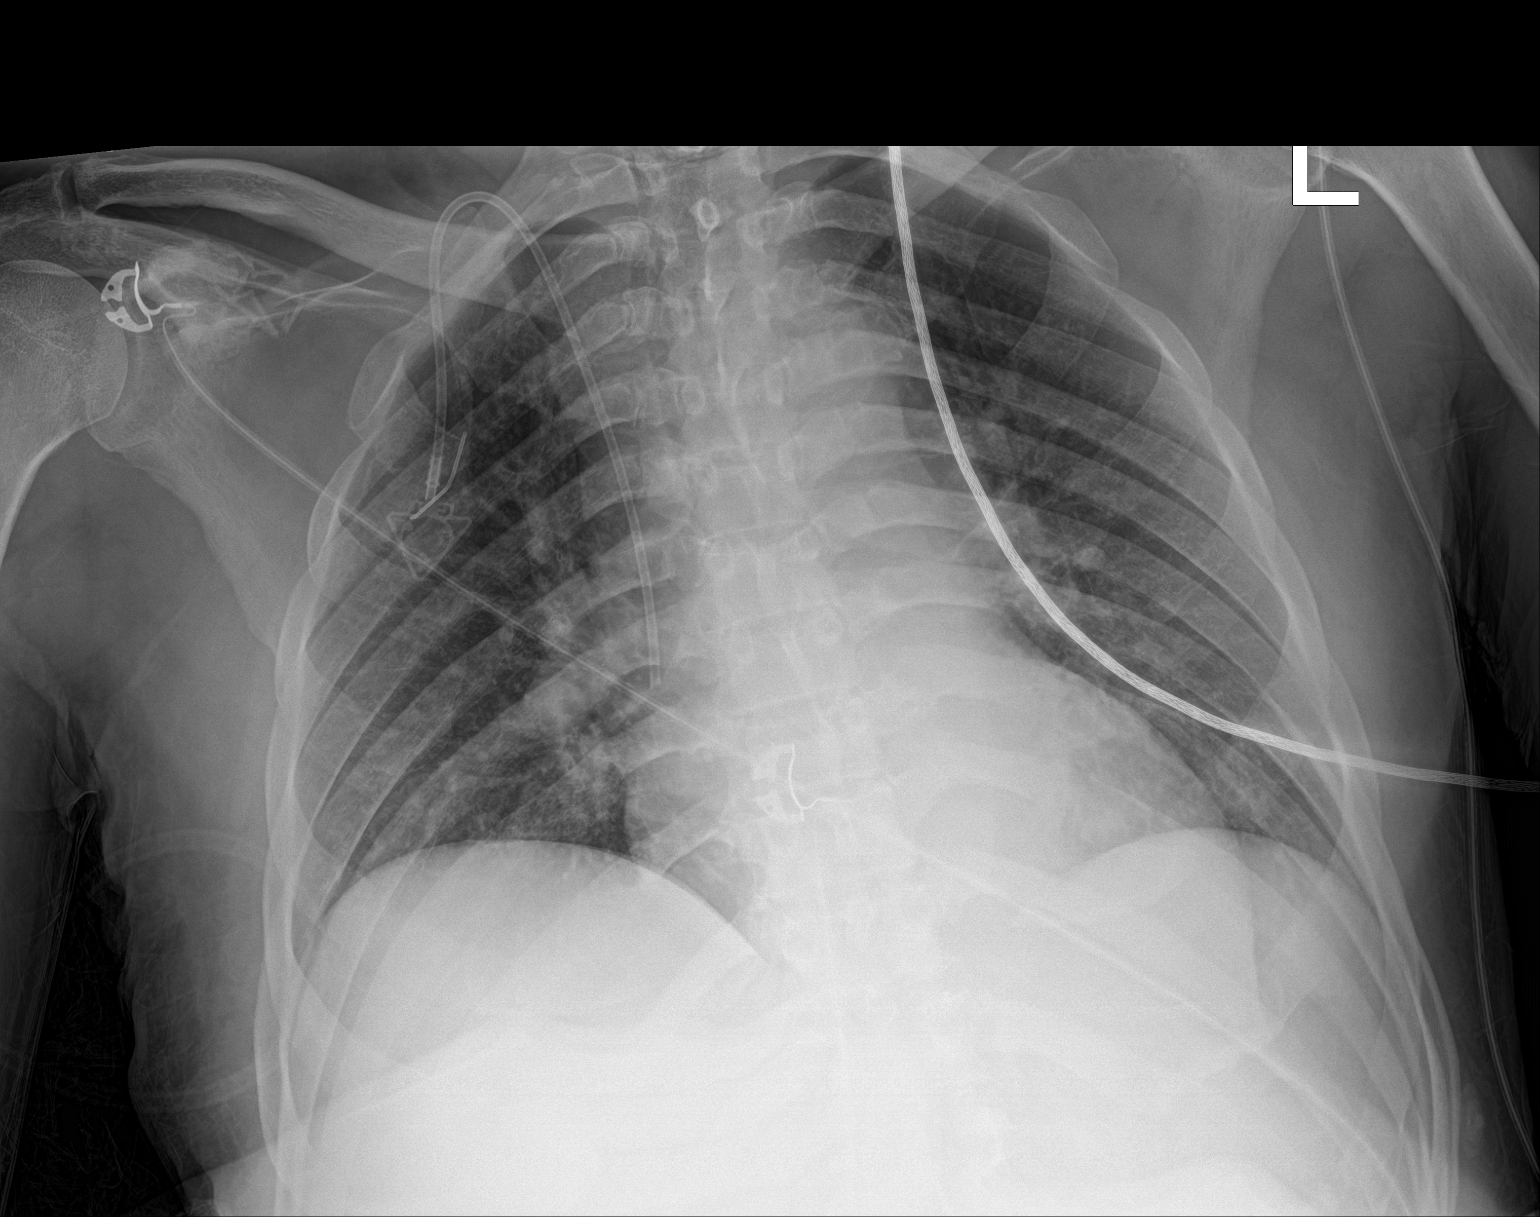

[1 of 1 positions shown; findings below may reference images not displayed]

FINDINGS: Portable AP semi upright view at [ES] hours. Right chest power port
is new since last year. Lower lung volumes. Mediastinal contours
remain within normal limits. Visualized tracheal air column is
within normal limits. Diffuse crowding of lung markings with no
pneumothorax, pleural effusion, consolidation, or overt edema. No
lung nodule identified. Visible osseous structures within normal
limits. Paucity of bowel gas.
IMPRESSION: Low lung volumes, otherwise no acute cardiopulmonary abnormality.

## 2021-12-05 IMAGING — MR MR HEAD W/O CM
6 of 10 series · 29 of 48 positions shown · non-contrast
Comparison: CTA head and neck yesterday.  Brain MRI [DATE].

CLINICAL DATA: 80-year-old male acute neurologic deficit. Left side
weakness. Metastatic disease unknown primary. Widely scattered small
embolic appearing cerebral and cerebellar infarcts in [REDACTED]. New
cerebellar hypodensity on CT yesterday.

EXAM:
MRI HEAD WITHOUT CONTRAST
TECHNIQUE: Multiplanar, multiecho pulse sequences of the brain and surrounding
structures were obtained without intravenous contrast.

[Series 2: DWI · axial · 3.0mm · 0.94mm/px · z∈[-85,+63]mm · 9 of 101 slices shown (1 of 2)]
[im 1/101]
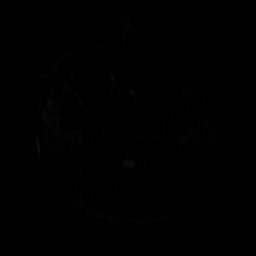
[im 13/101]
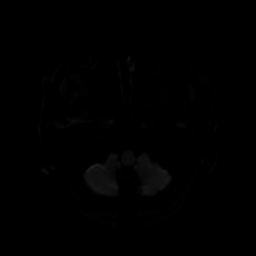
[im 26/101]
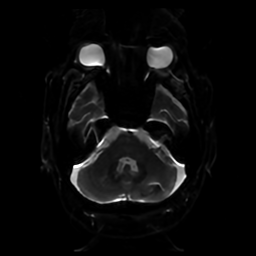
[im 38/101]
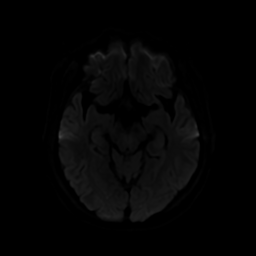
[im 51/101]
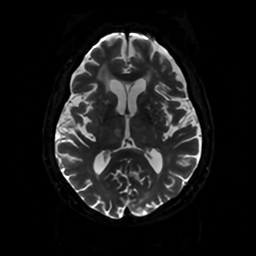
[im 63/101]
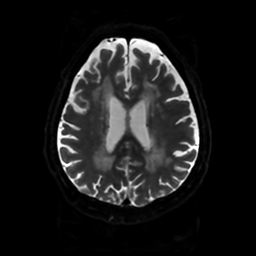
[im 76/101]
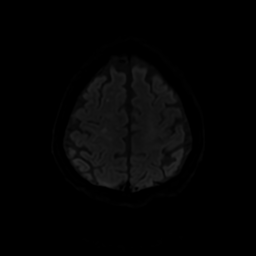
[im 88/101]
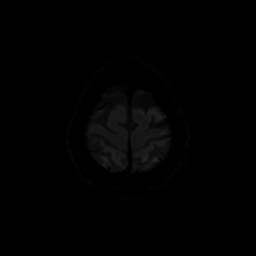
[im 101/101]
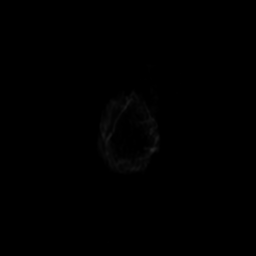

[Series 3: DWI · coronal · 4.0mm · 0.94mm/px · 7 of 74 slices shown (2 of 2)]
[im 1/74]
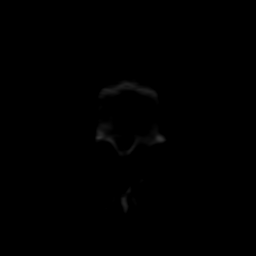
[im 13/74]
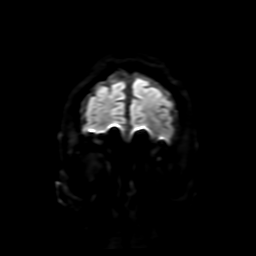
[im 25/74]
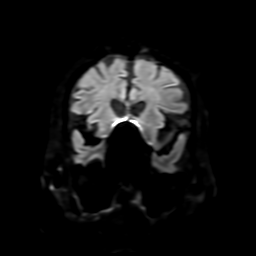
[im 37/74]
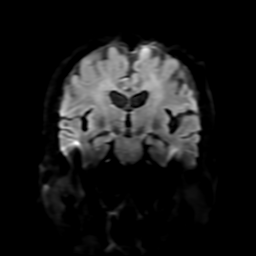
[im 49/74]
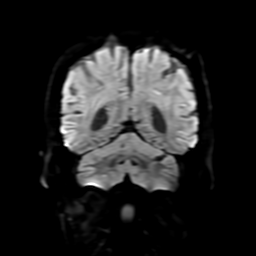
[im 61/74]
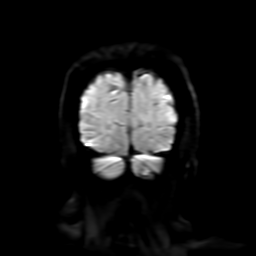
[im 74/74]
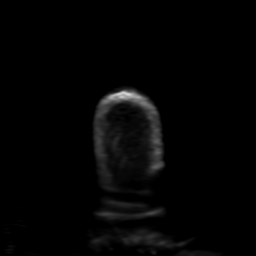

[Series 4: FLAIR · sagittal · 5.0mm · 0.23mm/px · 2 of 26 slices shown (1 of 2)]
[im 1/26]
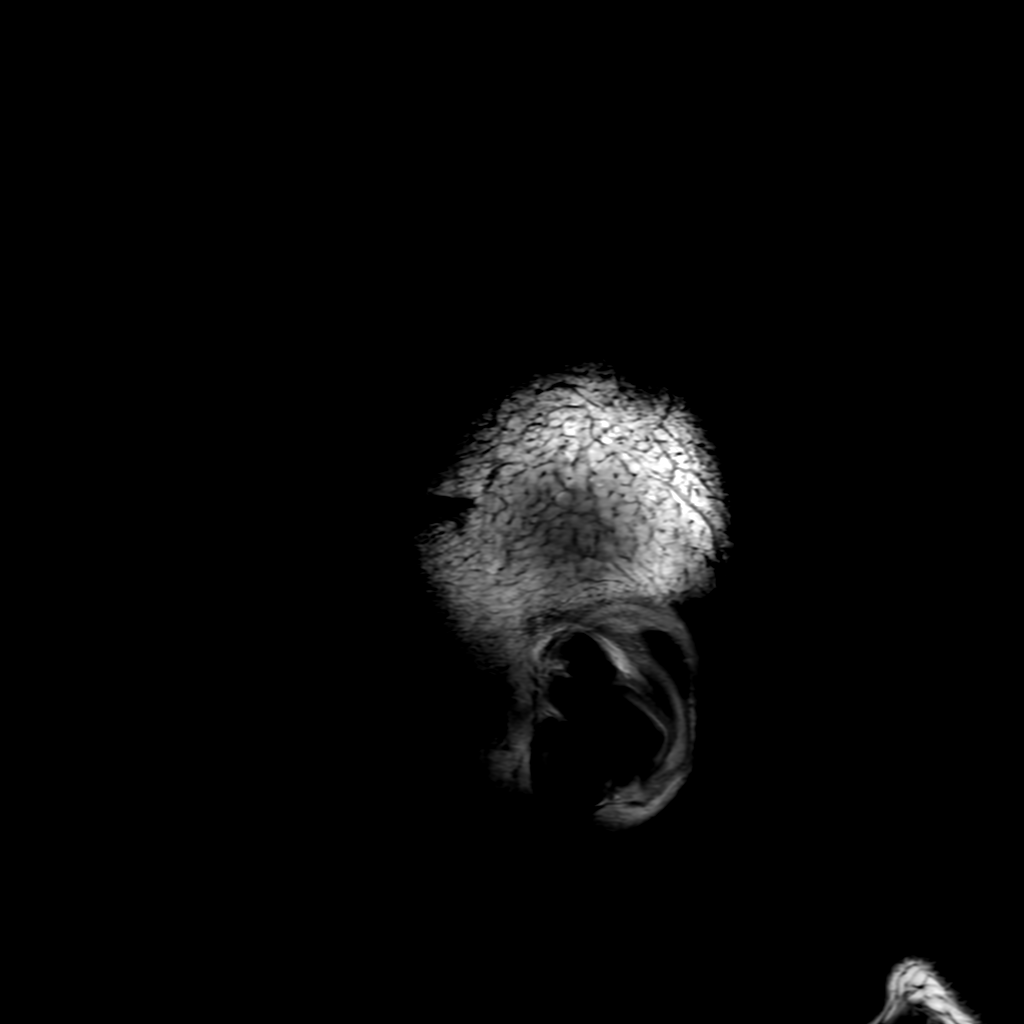
[im 26/26]
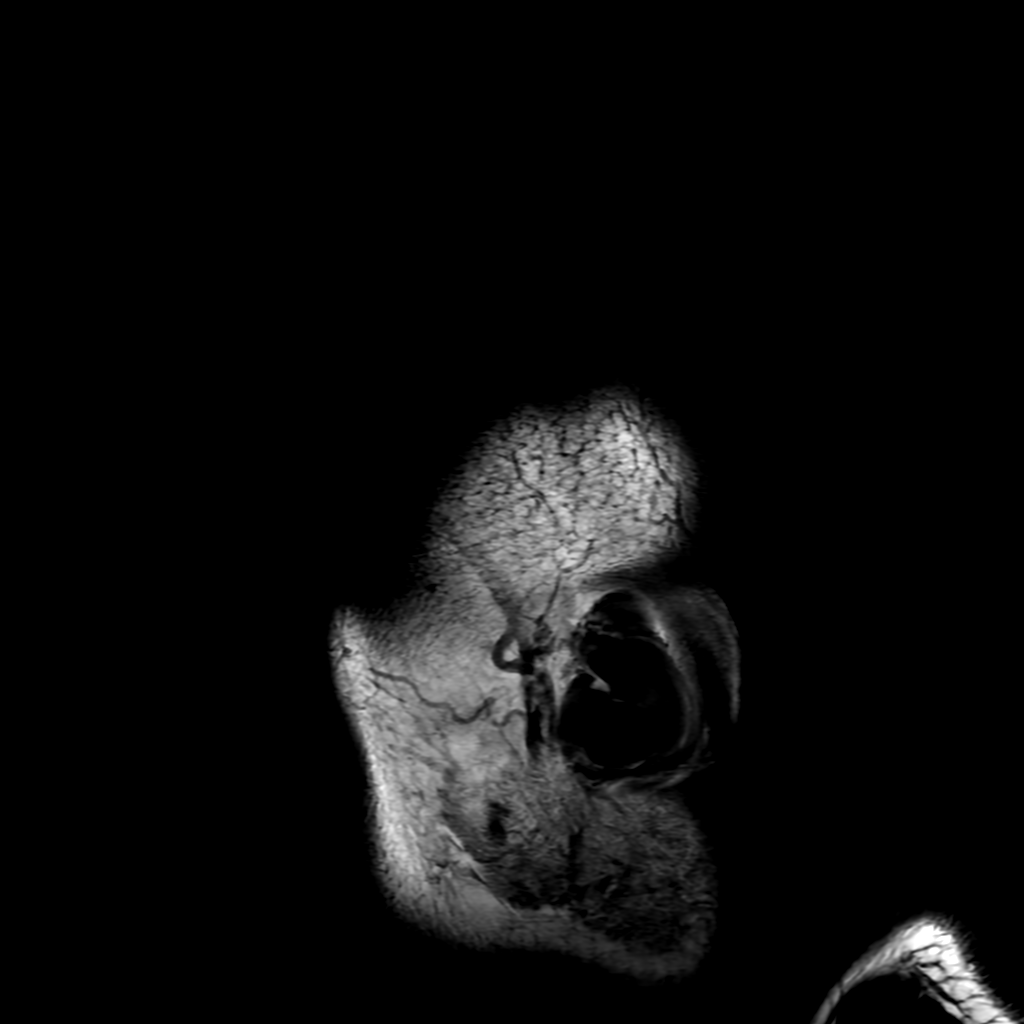

[Series 6: FLAIR · axial · 4.0mm · 0.45mm/px · z∈[-86,+62]mm · 3 of 35 slices shown (2 of 2)]
[im 1/35]
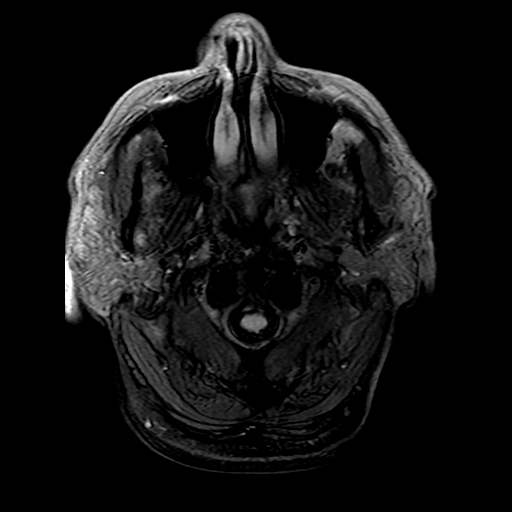
[im 18/35]
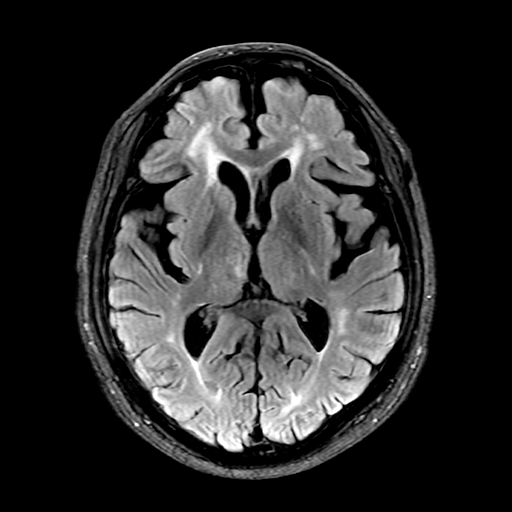
[im 35/35]
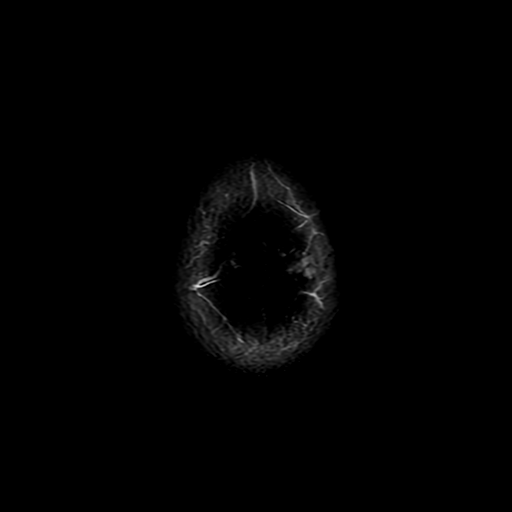

[Series 250: ADC · axial · 3.0mm · 0.94mm/px · z∈[-85,+63]mm · 5 of 50 slices shown (1 of 2)]
[im 1/50]
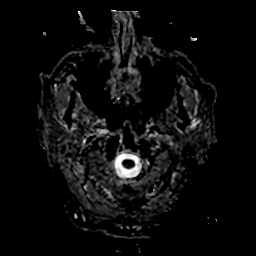
[im 13/50]
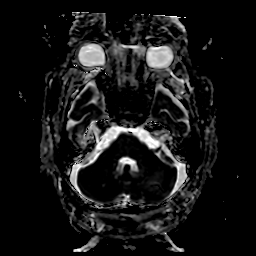
[im 25/50]
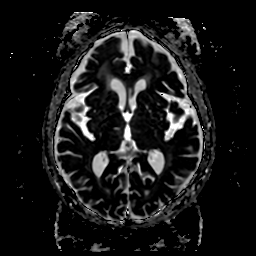
[im 37/50]
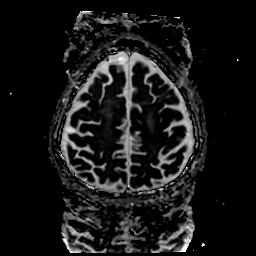
[im 50/50]
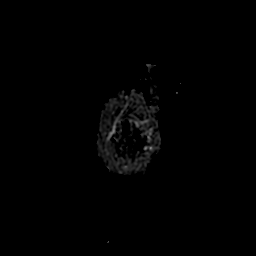

[Series 350: ADC · coronal · 4.0mm · 0.94mm/px · 3 of 35 slices shown (2 of 2)]
[im 1/35]
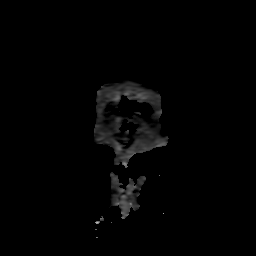
[im 18/35]
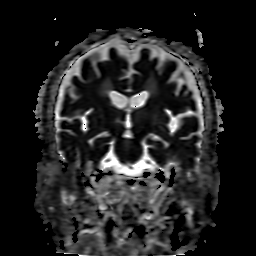
[im 35/35]
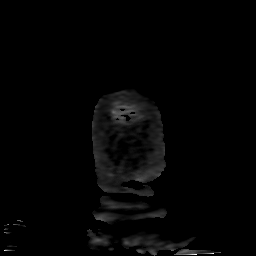

[29 of 48 positions shown; findings below may reference images not displayed]

FINDINGS: Brain: Mostly expected evolution of the scattered small bilateral
cerebral and cerebellar infarcts seen in [REDACTED], including in the
right thalamus. But petechial hemorrhagic conversion of multiple
bilateral cerebellar infarcts since that time, including confluent
petechial hemorrhage in the left hemisphere on series 7, image 23
with faint associated cerebellar edema. And multiple such small
hemorrhagic infarcts are new since [REDACTED] but nonacute.

No contrast administered.

On DWI there is a small contralateral central right cerebellar area
of diffusion restriction now (series 2, image 11). a and a small
focus of white matter restricted diffusion in the left splenium
(series 2, image 28) with T2 and FLAIR hyperintensity. Also
questionable punctate restricted diffusion in the left occipital
pole white matter, although may be susceptibility artifact from
chronic microhemorrhage there also.

Furthermore, there are several small bilateral frontal and parietal
lobe central white matter foci of abnormal diffusion, although most
appear facilitated on ADC (series 2, image 33).

Fewer new but chronic microhemorrhages scattered in the
supratentorial brain. Underlying confluent bilateral cerebral white
matter T2 and FLAIR hyperintensity which is advanced but
nonspecific. Stable similar T2 and FLAIR hyperintensity in the pons.
Small areas of developing cortical encephalomalacia corresponding to
interval small cortical infarcts such as medial superior right
parietal lobe series 6, image 29.

No midline shift, obvious mass lesion, ventriculomegaly, extra-axial
collection or acute intracranial hemorrhage. Cervicomedullary
junction and pituitary are within normal limits.

Vascular: Major intracranial vascular flow voids are stable.

Skull and upper cervical spine: Heterogeneous bone marrow signal is
indeterminate in the visible cervical spine, not definitely
progressed since [REDACTED]. No destructive osseous lesion is
identified.

Sinuses/Orbits: Stable, negative.

Other: Mastoids are clear. Visible internal auditory structures
appear normal.
IMPRESSION: 1. No contrast administered, limiting evaluation for metastatic
disease.

2. Progression of embolic appearing small infarcts scattered in the
bilateral cerebral and cerebellar hemispheres since [REDACTED]. Many
such infarcts demonstrate petechial hemorrhage (including confluent
new petechial hemorrhage in the left cerebellum), but no malignant
hemorrhagic transformation or intracranial mass effect.
Small acute infarcts are identified in the right cerebellum, left
splenium. And there are multiple subacute appearing punctate white
matter infarcts.

3. Underlying advanced but nonspecific signal changes in the
cerebral white matter and pons possibly due to chronic small vessel
disease.

## 2021-12-05 IMAGING — CT CT HIP*L* W/O CM
2 of 5 series · 16 of 46 positions shown, 18 images · non-contrast
Comparison: None available

CLINICAL DATA: Hip pain. Infection suspected. X-ray done. Acute on
chronic left hip pain. Fall at home. History of osteoarthritis.
Evaluate for occult fracture.

EXAM:
CT OF THE LEFT HIP WITHOUT CONTRAST
TECHNIQUE: Multidetector CT imaging of the left hip was performed according to
the standard protocol. Multiplanar CT image reconstructions were
also generated.
RADIATION DOSE REDUCTION: This exam was performed according to the
departmental dose-optimization program which includes automated
exposure control, adjustment of the mA and/or kV according to
patient size and/or use of iterative reconstruction technique.

[Series 5: hip 2.0 ax st · axial · 0.39mm/px · z∈[-1280,-1092]mm · 13 of 111 slices shown, 15 images]
[im 8/111  soft-tissue]
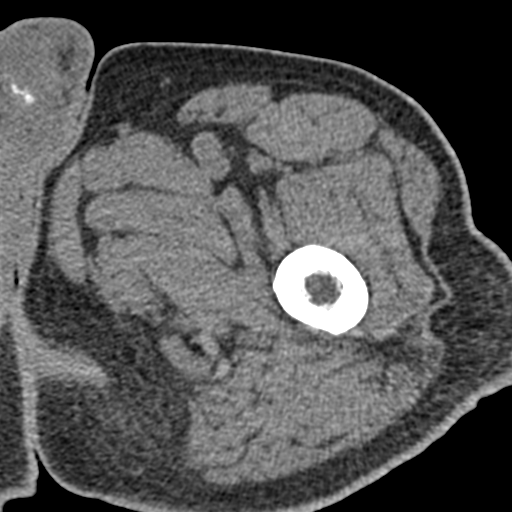
[im 8/111  bone]
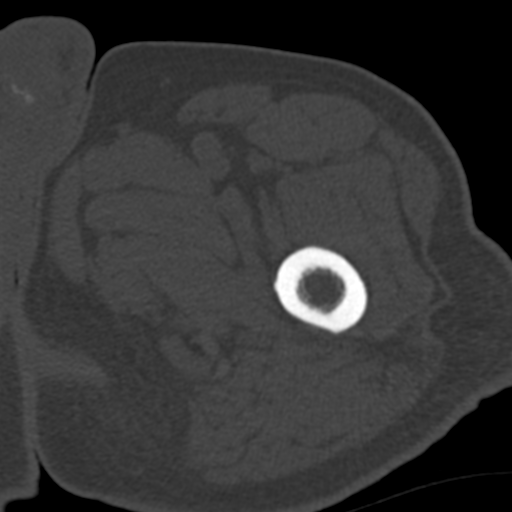
[im 16/111  soft-tissue]
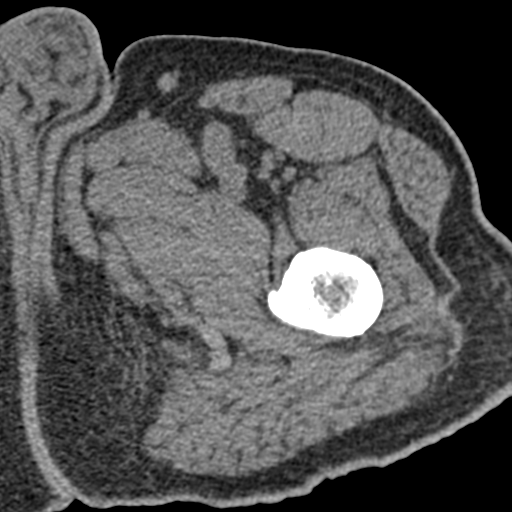
[im 24/111  soft-tissue]
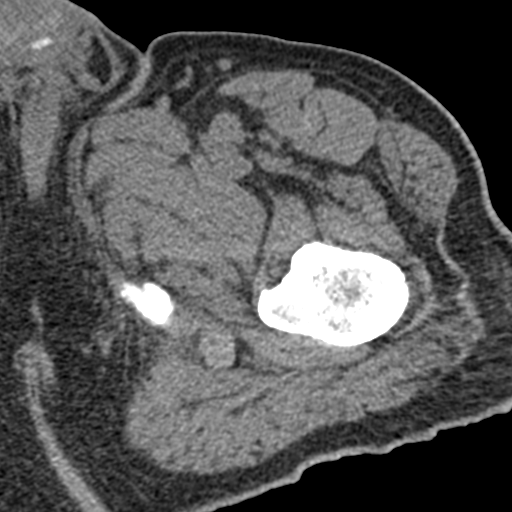
[im 32/111  soft-tissue]
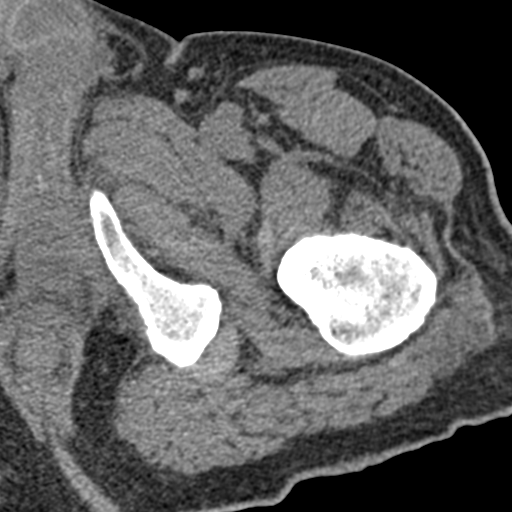
[im 40/111  soft-tissue]
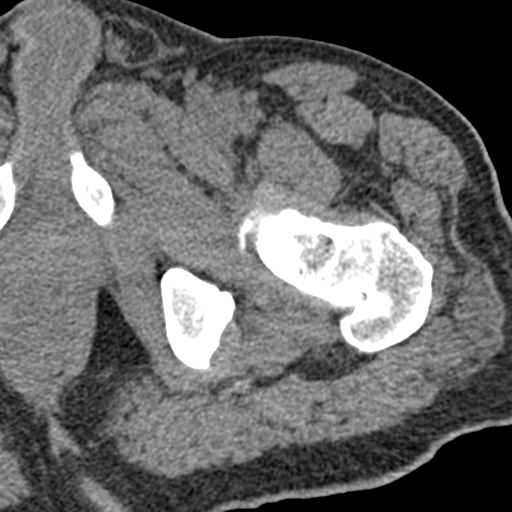
[im 48/111  soft-tissue]
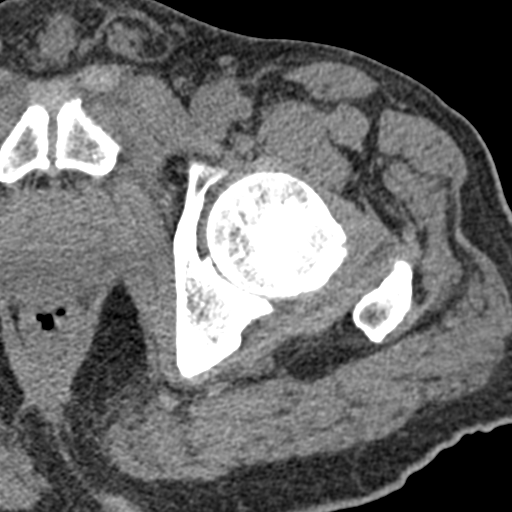
[im 56/111  soft-tissue]
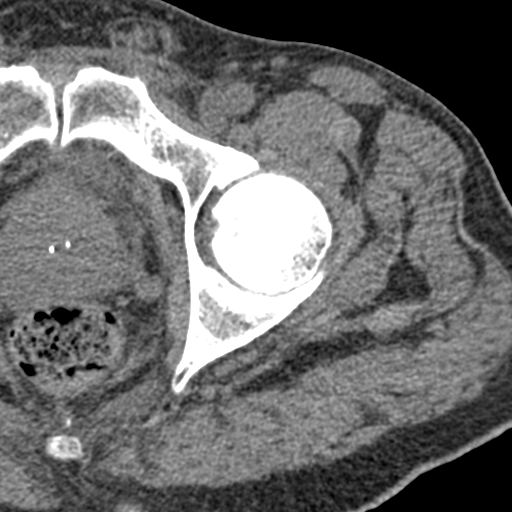
[im 63/111  soft-tissue]
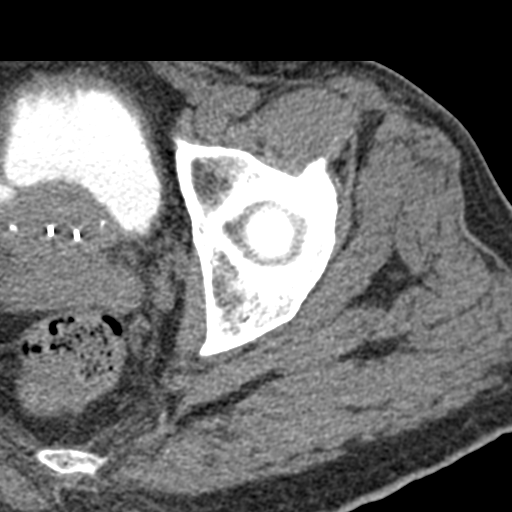
[im 71/111  soft-tissue]
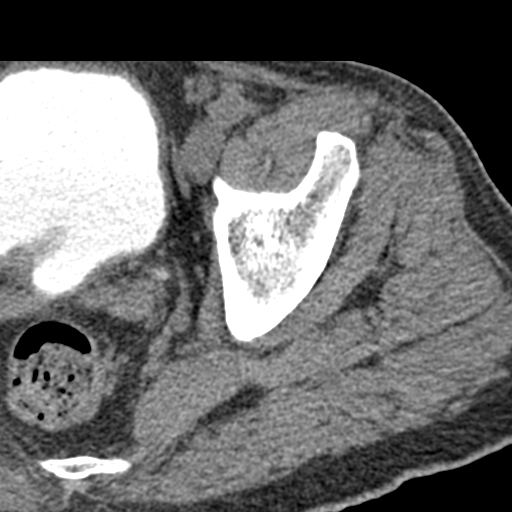
[im 71/111  bone]
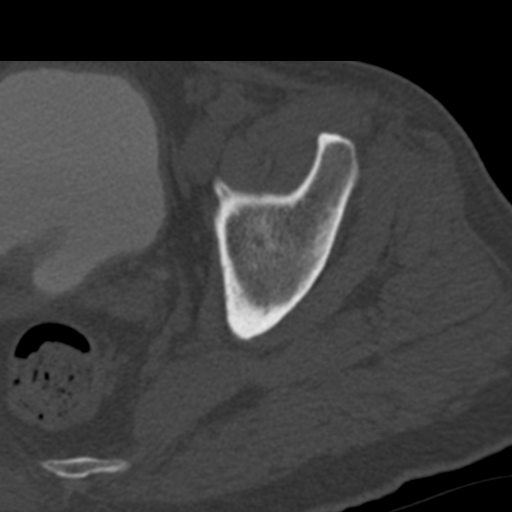
[im 79/111  soft-tissue]
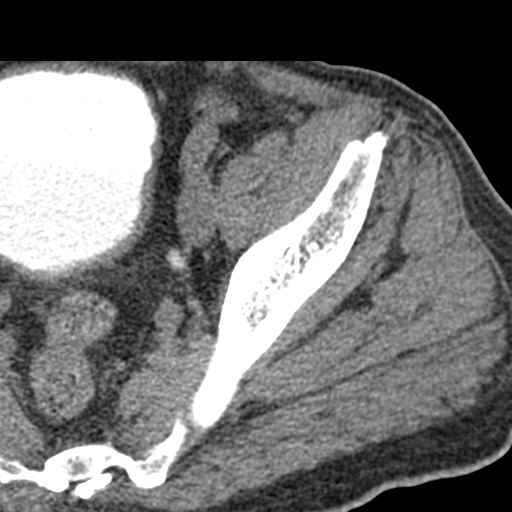
[im 87/111  soft-tissue]
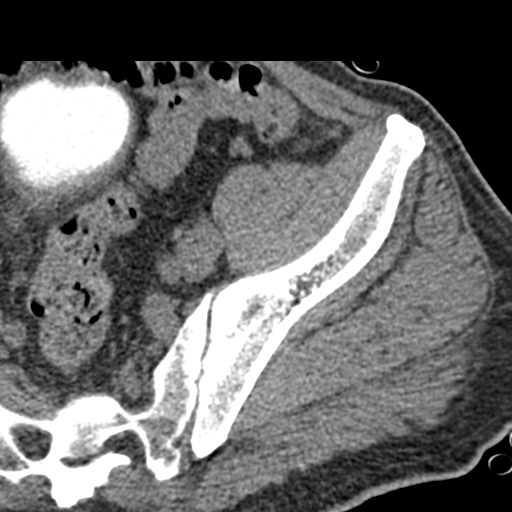
[im 95/111  soft-tissue]
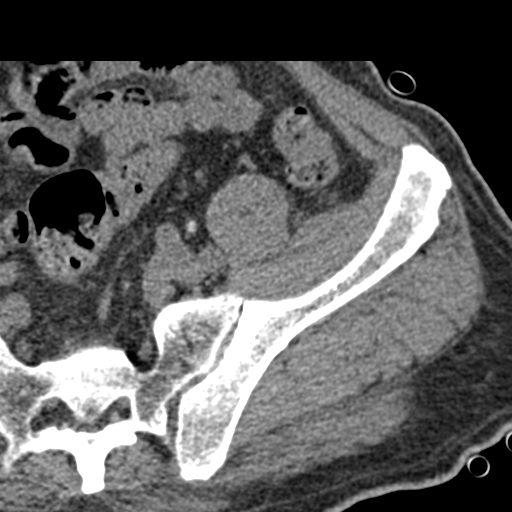
[im 103/111  soft-tissue]
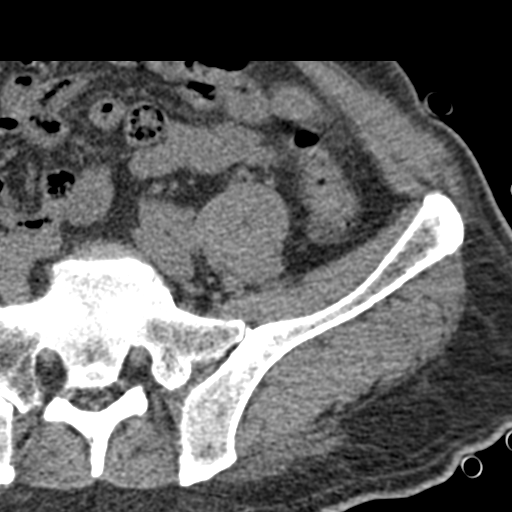

[Series 8: hip 2.0 cor. st · coronal · 0.43mm/px · 3 of 93 slices shown]
[im 31/93  soft-tissue]
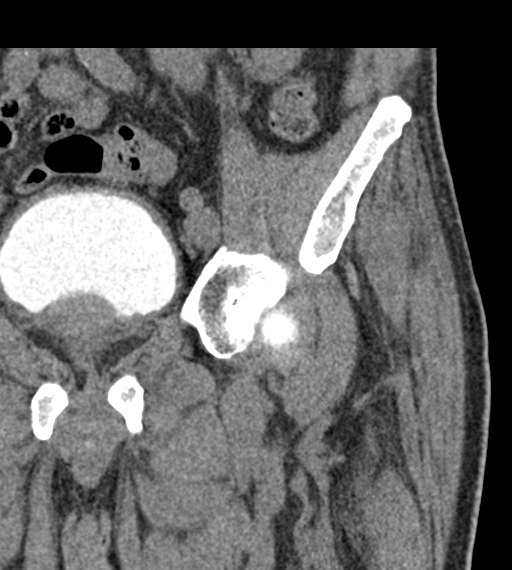
[im 41/93  soft-tissue]
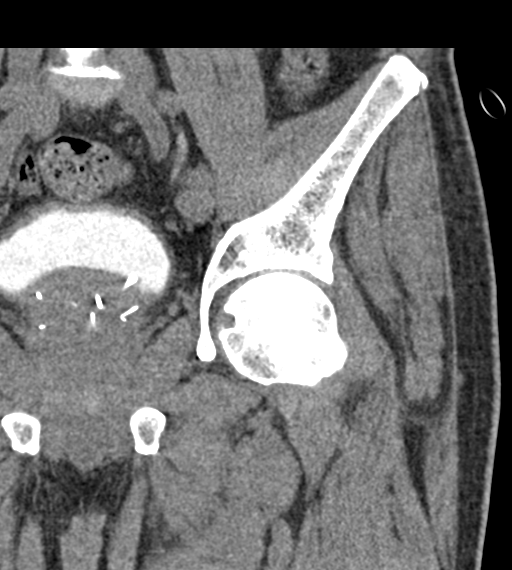
[im 52/93  soft-tissue]
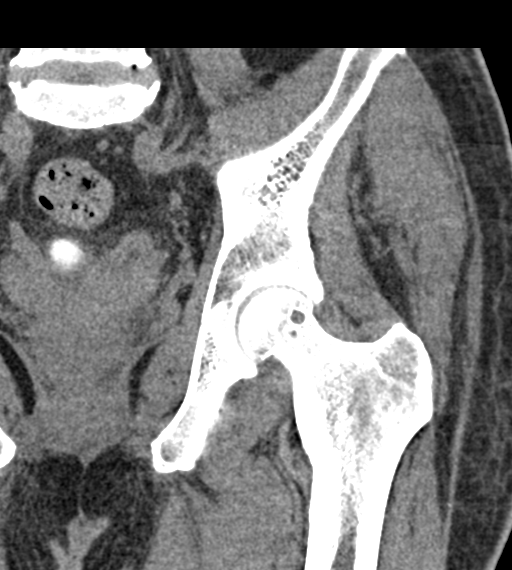

[16 of 46 positions shown; findings below may reference images not displayed]

FINDINGS: Bones/Joint/Cartilage

Moderate superior left femoroacetabular joint space narrowing. Mild
left femoral head-neck junction circumferential degenerative
osteophytes. Mild-to-moderate posterosuperior left femoral head-neck
junction subchondral degenerative cystic change.

Mild pubic symphysis joint space narrowing and peripheral
osteophytosis degenerative change.

No acute fracture is seen. No dislocation. No cortical erosion to
suggest osteomyelitis.

Ligaments

Suboptimally assessed by CT.

Muscles and Tendons

Unremarkable, within the limitations of this modality.

Soft tissues

No left hip joint effusion.

There are metallic densities within the prostate, possibly
brachytherapy seeds. The partially visualized prostate is moderately
enlarged, impressing on the urinary bladder base. Mild posterior
urinary bladder outpouching may represent a small diverticulum
superior to the prostate (axial series 5, image 38).

Mild fat containing left inguinal hernia.

Mild-to-moderate L5-S1 disc space narrowing, vacuum phenomenon, and
peripheral degenerative osteophytosis.
IMPRESSION: 1. No acute fracture or CT evidence of osteomyelitis is seen.
2. Moderate left femoroacetabular osteoarthritis.
3. Mild pubic symphysis osteoarthritis.

## 2021-12-05 IMAGING — CT CT ABD-PELV W/ CM
2 of 5 series · 15 of 46 positions shown, 17 images · IV contrast (agent unspecified)
Comparison: PET CT [DATE]

CLINICAL DATA: Abdominal pain, acute, nonlocalized. Metastatic
cancer of unknown primary.

EXAM:
CT ABDOMEN AND PELVIS WITH CONTRAST
TECHNIQUE: Multidetector CT imaging of the abdomen and pelvis was performed
using the standard protocol following bolus administration of
intravenous contrast.

[Series 3: abd/ pelvis 5.0 i30f 2 · axial · 0.79mm/px · z∈[+763,+1168]mm · 12 of 91 slices shown, 14 images]
[im 5/91  soft-tissue]
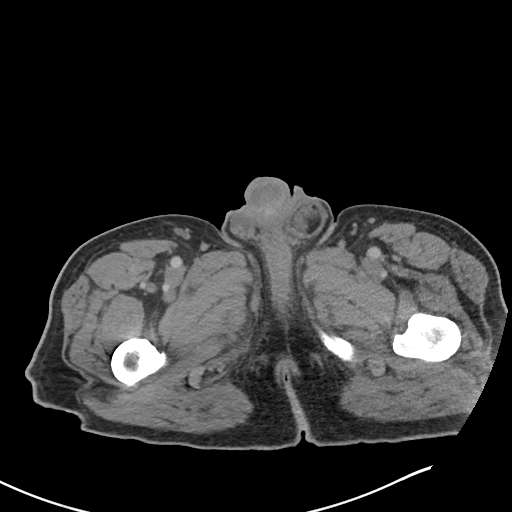
[im 5/91  bone]
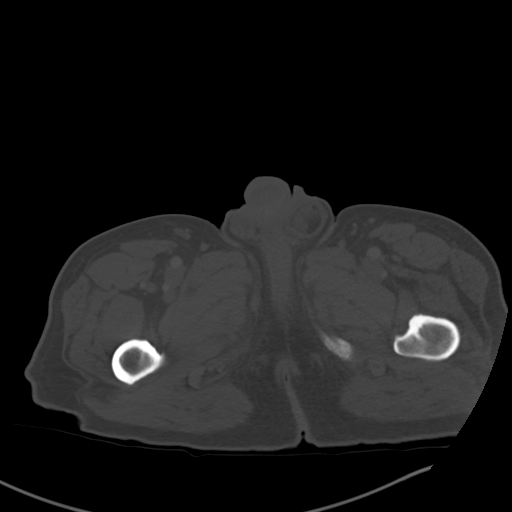
[im 15/91  soft-tissue]
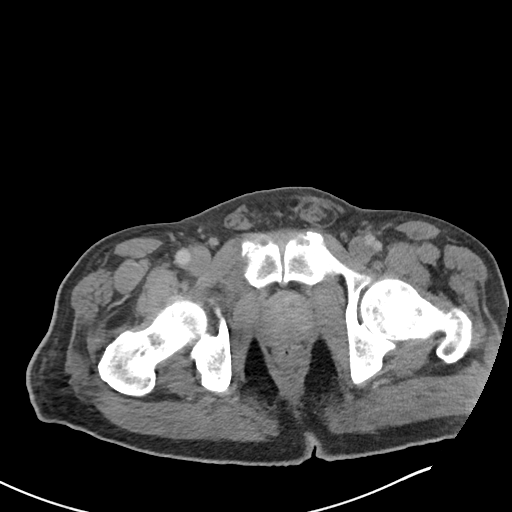
[im 19/91  soft-tissue]
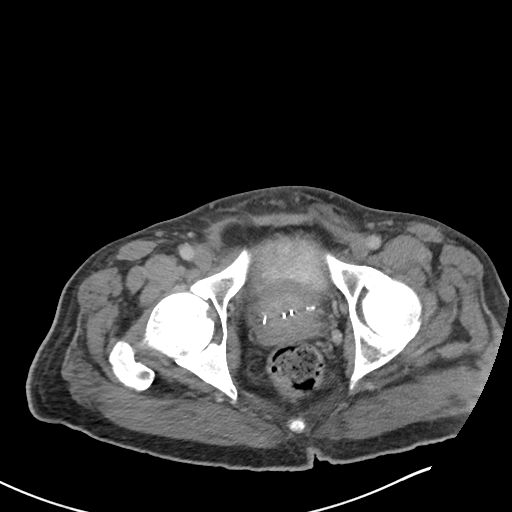
[im 29/91  soft-tissue]
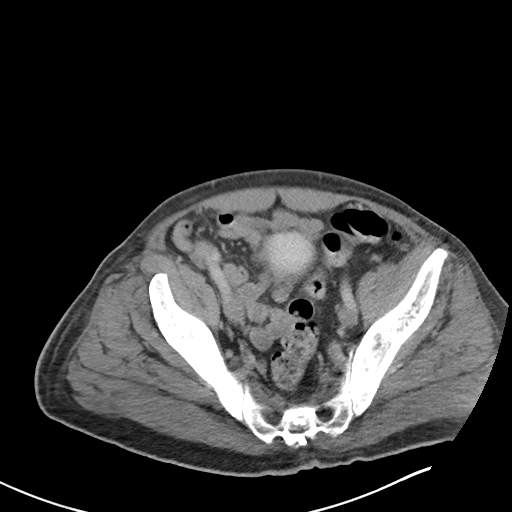
[im 34/91  soft-tissue]
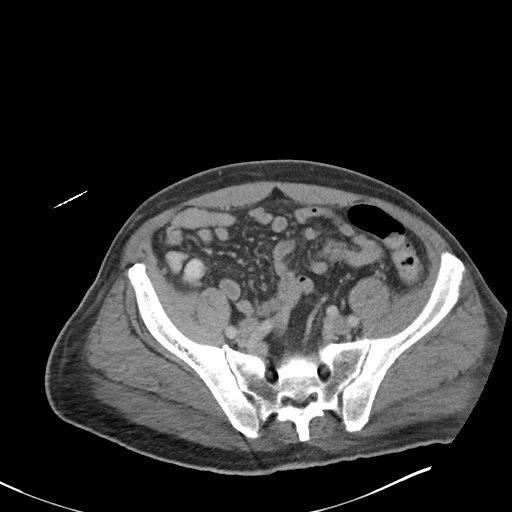
[im 43/91  soft-tissue]
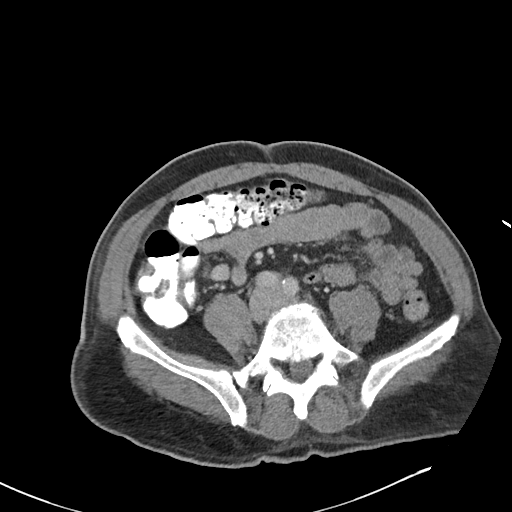
[im 48/91  soft-tissue]
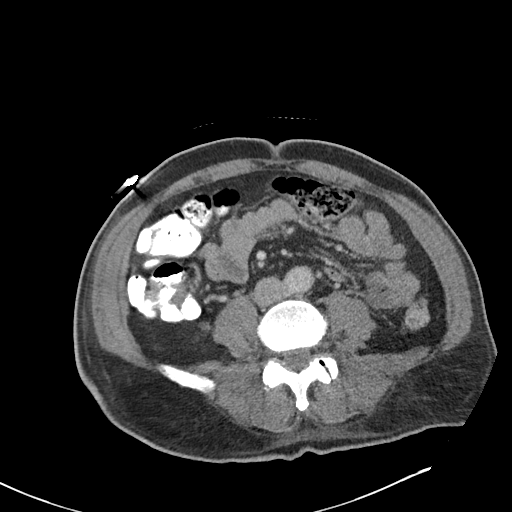
[im 57/91  soft-tissue]
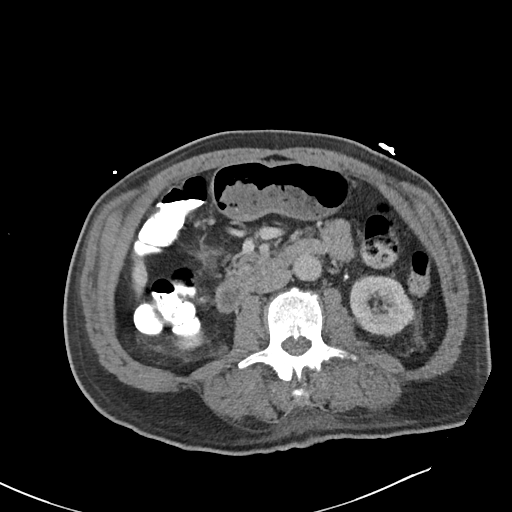
[im 62/91  soft-tissue]
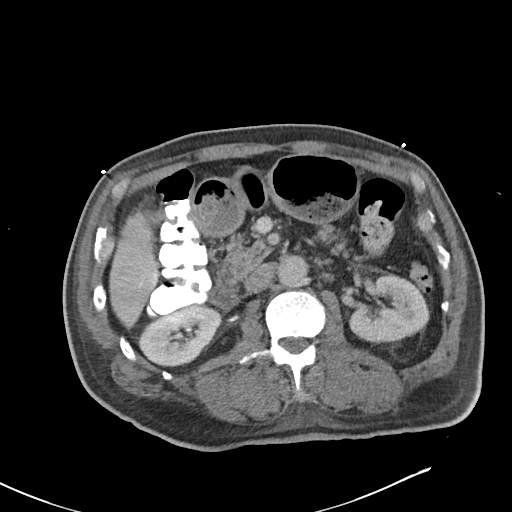
[im 62/91  bone]
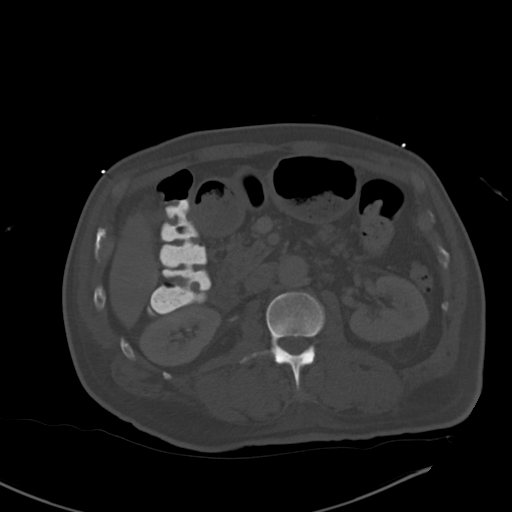
[im 72/91  soft-tissue]
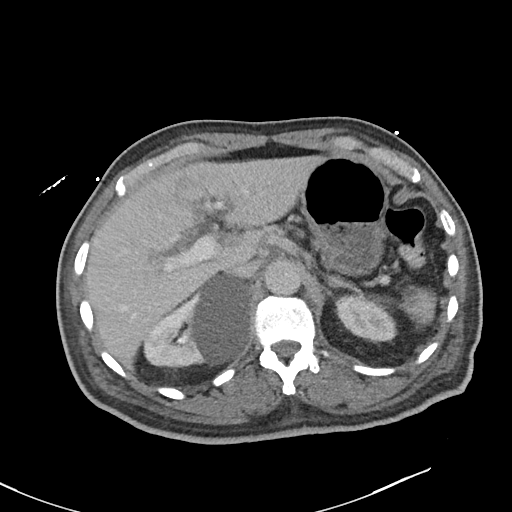
[im 76/91  soft-tissue]
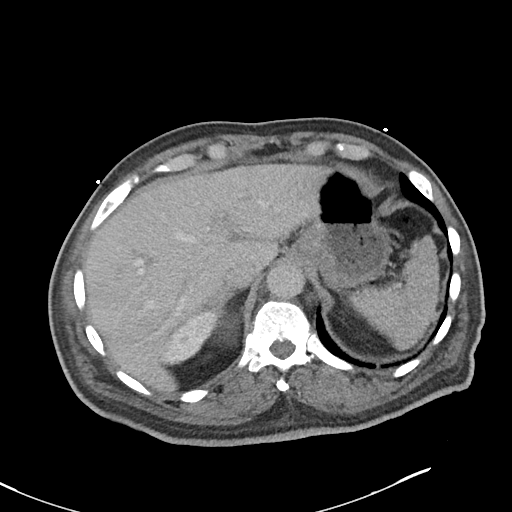
[im 86/91  soft-tissue]
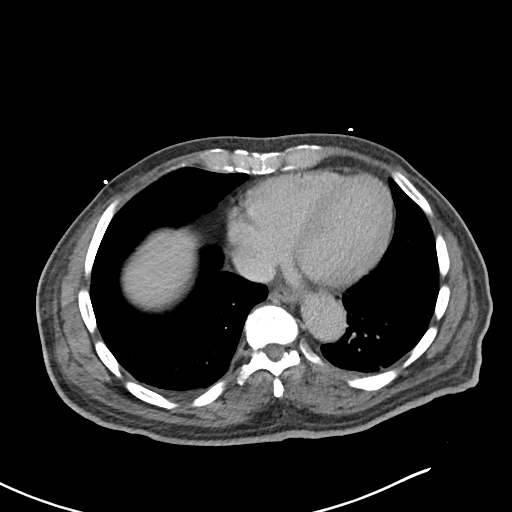

[Series 6: coronal soft tissue · coronal · 0.79mm/px · 3 of 101 slices shown]
[im 34/101  soft-tissue]
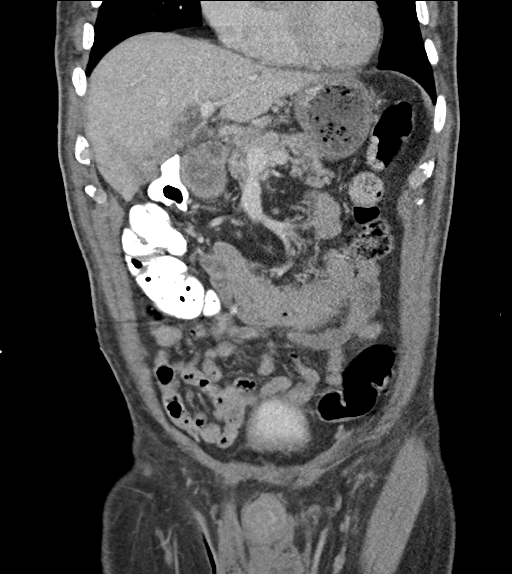
[im 45/101  soft-tissue]
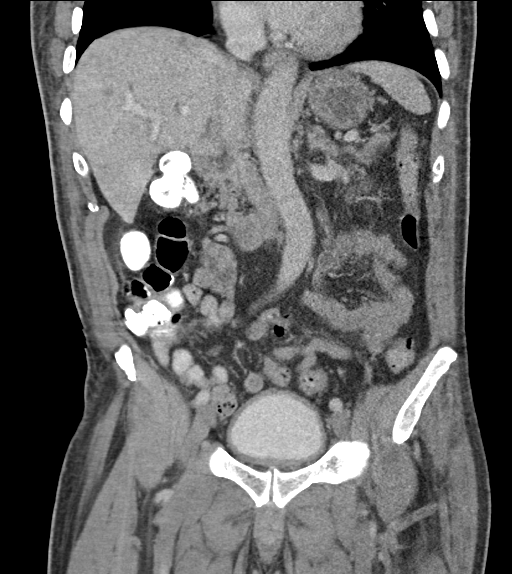
[im 56/101  soft-tissue]
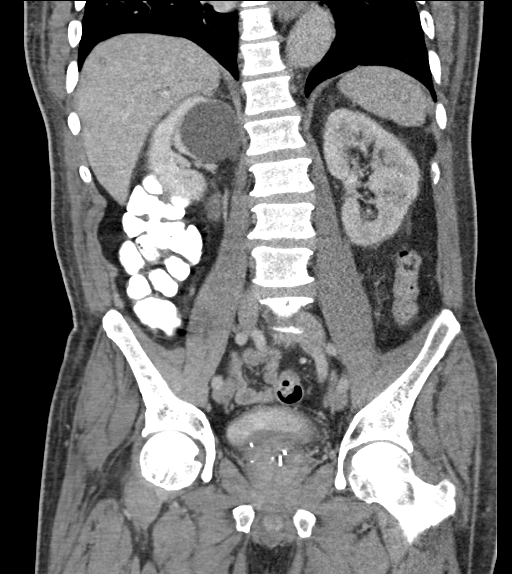

[15 of 46 positions shown; findings below may reference images not displayed]

RADIATION DOSE REDUCTION: This exam was performed according to the
departmental dose-optimization program which includes automated
exposure control, adjustment of the mA and/or kV according to
patient size and/or use of iterative reconstruction technique.

CONTRAST:  100mL OMNIPAQUE IOHEXOL 300 MG/ML  SOLN
FINDINGS: Lower chest: No acute abnormality.

Hepatobiliary: Multiple heterogeneously enhancing masses are again
seen throughout both hepatic lobes with the dominant conglomerate
within segment 5 of the liver similar to that noted on prior PET CT
examination. The appearance is relatively stable when compared to
prior MRI examination of [DATE]. Mild bilobar intrahepatic
biliary ductal dilation appears stable since prior MRI examination.
Main, right, and left portal veins are patent. The gallbladder
appears intimately associated with the malignant masses within
segment 5 of the liver and a primary gallbladder carcinoma could be
considered. This, too, appears unchanged from prior examination.

Pancreas: Unremarkable

Spleen: Unremarkable

Adrenals/Urinary Tract: The adrenal glands are unremarkable. The
kidneys are normal in size and position. 7 cm simple cortical cyst
noted within the upper pole of the right kidney. Hyperdensity within
the upper pole calices of the right kidney and within the left renal
pelvis likely represents excreted contrast. No hydronephrosis. No
definite nephro or urolithiasis. The bladder is unremarkable.

Stomach/Bowel: The stomach is unremarkable. The second portion the
duodenum is in close proximity to the porta hepatis and cystic duct
and there is infiltration of the periduodenal soft tissue which may
relate to edema or infiltration related to the patient's primary
malignancy. This is best appreciated on coronal image # 37/6 focal
narrowing of the duodenum in this region may cicatricial change. The
small and large bowel are unremarkable. The appendix is not clearly
identified and may be absent. No free intraperitoneal gas or fluid.
Reflect

Vascular/Lymphatic: Aortic atherosclerosis. No enlarged abdominal or
pelvic lymph nodes.

Reproductive: The prostate gland is enlarged. Metallic densities
within the prostate gland may relate to UroLift procedure.

Other: Tiny fat containing umbilical hernia.

Musculoskeletal: No acute bone abnormality. No lytic or blastic bone
lesion.
IMPRESSION: Stable examination. Bilobar hepatic masses in keeping with
metastatic disease with dominant conglomerate within segment 5 of
the liver in close proximity to the adjacent gallbladder which
appears contracted and demonstrates irregular wall thickening.
Together, the findings may reflect changes of metastatic gallbladder
cancer. Infiltrative change within the porta hepatis noted with
abutment of the second portion of the duodenum without evidence of
obstruction or fistula formation at this time. Stable mild
intrahepatic biliary ductal dilation.

No acute intra-abdominal process identified.

Aortic Atherosclerosis ([F4]-[F4]).

## 2021-12-05 MED ORDER — LACTATED RINGERS IV SOLN: INTRAVENOUS | Status: DC

## 2021-12-05 MED ORDER — SODIUM CHLORIDE 0.9 % IV BOLUS
1000.0000 mL | Freq: Once | INTRAVENOUS | Status: AC
  Administered 2021-12-05: 1000 mL via INTRAVENOUS

## 2021-12-05 MED ORDER — IOHEXOL 300 MG/ML  SOLN
100.0000 mL | Freq: Once | INTRAMUSCULAR | Status: AC | PRN
  Administered 2021-12-05: 100 mL via INTRAVENOUS

## 2021-12-05 MED ORDER — SODIUM CHLORIDE 0.9 % IV SOLN
2.0000 g | INTRAVENOUS | Status: DC
  Administered 2021-12-05 – 2021-12-06 (×2): 2 g via INTRAVENOUS
  Filled 2021-12-05 (×2): qty 20

## 2021-12-05 MED ORDER — HEPARIN (PORCINE) 25000 UT/250ML-% IV SOLN
1050.0000 [IU]/h | INTRAVENOUS | Status: DC
  Administered 2021-12-05: 1200 [IU]/h via INTRAVENOUS
  Administered 2021-12-07: 21:00:00 1050 [IU]/h via INTRAVENOUS
  Administered 2021-12-07: 05:00:00 1100 [IU]/h via INTRAVENOUS
  Filled 2021-12-05 (×4): qty 250

## 2021-12-05 MED ORDER — IOHEXOL 9 MG/ML PO SOLN
ORAL | Status: AC
  Administered 2021-12-05: 500 mL
  Filled 2021-12-05: qty 1000

## 2021-12-05 MED ORDER — MAGNESIUM SULFATE 2 GM/50ML IV SOLN
2.0000 g | Freq: Once | INTRAVENOUS | Status: AC
  Administered 2021-12-05: 2 g via INTRAVENOUS
  Filled 2021-12-05: qty 50

## 2021-12-05 NOTE — Progress Notes (Signed)
PT Cancellation Note  Patient Details Name: Robert Warner MRN: [id number] DOB: August 24, 1941   Cancelled Treatment:    Reason Eval/Treat Not Completed: Patient at procedure or test/unavailable (CT hip)  Wyona Almas, PT, DPT Acute Rehabilitation Services Pager [phone] Office [phone]    Deno Etienne 12/05/2021, 12:07 PM

## 2021-12-05 NOTE — Progress Notes (Addendum)
Tachycardia and temperature resolved, but now BP 94 systolic.  No symptoms, says he feels better and resting comfortably per RN.  Ordering 1L NS bolus.

## 2021-12-05 NOTE — Evaluation (Signed)
Speech Language Pathology Evaluation Patient Details Name: Robert Warner MRN: [id number] DOB: 1941/05/07 Today's Date: 12/05/2021 Time: 4098-1191 SLP Time Calculation (min) (ACUTE ONLY): 35 min  Problem List:  Patient Active Problem List   Diagnosis Date Noted   AKI (acute kidney injury) ([place]) 12/04/2021   Hyponatremia 12/04/2021   Cancer with unknown primary site [facility]) 10/11/2021   Goals of care, counseling/discussion 10/11/2021   Chest pain 09/21/2021   Recent cerebrovascular accident (CVA) 09/21/2021   Interatrial cardiac shunt 47/82/9562   Acute embolic stroke ([place]) 13/06/6577   Mixed diabetic hyperlipidemia associated with type 2 diabetes mellitus ([place]) 09/16/2021   Liver masses 09/16/2021   Essential hypertension 03/12/2012   Type 2 diabetes mellitus with hyperglycemia ([place]) 06/15/2008   BPH (benign prostatic hyperplasia) 06/15/2008   Past Medical History:  Past Medical History:  Diagnosis Date   Arthritis    Cancer ([place])    Cataract    Diabetes mellitus    Hyperlipidemia    Stroke [facility])    Past Surgical History:  Past Surgical History:  Procedure Laterality Date   APPENDECTOMY  1968   CATARACT EXTRACTION Bilateral 07/2000   states 2 weeks ago (first of sept) OD due in Dec    COLONOSCOPY  2004,2009   Negative, Dr. Sharlett Iles    IR IMAGING GUIDED PORT INSERTION  10/14/2021   KIDNEY STONE SURGERY  10/2018   PROSTATE BIOPSY  2006   Dr.Sigmund Tannebaum   HPI:  81yo male admitted 12/04/21 with dizziness. 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 Pt is known to this SLP, having seen pt in November 2011 for cognitive evaluation. Pt's wife reports deterioration of pt's cognitive function since beginning chemotherapy. She provided examples of pt's functional difficulties at home (not remembering code to his  phone, not remembering what light switches go with which items). The [place] Mental Status (SLUMS) was re-administered today. This assessment was given in November 2022, at which time pt scored 26/30. Today's evaluation revealed a score of 12/30, which is marked decline in neurocognitive function since last administration. Pt was able to provide orientation information, but struggled with mental math, thought organization (naming animals in 60 seconds), immediate and delayed recall of unrelated words, digit reversal, clock drawing, and auditory attention and recall. At this point, pt would benefit from home health speech therapy to provide education and teach strategies in pt's familiar home environment.    SLP Assessment  SLP Recommendation/Assessment: All further Speech Language Pathology needs can be addressed in the next venue of care  SLP Visit Diagnosis: Cognitive communication deficit (R41.841)    Recommendations for follow up therapy are one component of a multi-disciplinary discharge planning process, led by the attending physician.  Recommendations may be updated based on patient status, additional functional criteria and insurance authorization.    Follow Up Recommendations  Home health SLP    Assistance Recommended at Discharge  Frequent or constant Supervision/Assistance  Functional Status Assessment Patient has had a recent decline in their functional status and/or demonstrates limited ability to make significant improvements in function in a reasonable and predictable amount of time     SLP Evaluation Cognition  Overall Cognitive Status: History of cognitive impairments - at baseline Arousal/Alertness: Awake/alert Orientation Level: Oriented X4       Comprehension  Auditory Comprehension Overall Auditory Comprehension: Appears within functional limits for  tasks assessed    Expression Expression Primary Mode of Expression: Verbal Verbal Expression Overall  Verbal Expression: Appears within functional limits for tasks assessed Written Expression Dominant Hand: Right   Oral / Motor  Oral Motor/Sensory Function Overall Oral Motor/Sensory Function: Within functional limits Motor Speech Overall Motor Speech: Appears within functional limits for tasks assessed Intelligibility: Intelligible           Azaryah Oleksy B. Quentin Ore, [facility], [place] Speech Language Pathologist Office: [phone]  Shonna Chock 12/05/2021, 11:46 AM

## 2021-12-05 NOTE — Progress Notes (Signed)
OT Cancellation Note  Patient Details Name: Robert Warner MRN: [id number] DOB: 06-29-1941   Cancelled Treatment:    Reason Eval/Treat Not Completed: Patient at procedure or test/ unavailable (Pt off of floor for MRI, OT eval to f/u as appropriate.)  [name] [name] 12/05/2021, 8:20 AM

## 2021-12-05 NOTE — Care Management Obs Status (Signed)
[place] NOTIFICATION   Patient Details  Name: Robert Warner MRN: [id number] Date of Birth: 1941-01-31   Medicare Observation Status Notification Given:  Yes    Cyndi Bender, RN 12/05/2021, 2:13 PM

## 2021-12-05 NOTE — Telephone Encounter (Signed)
Oncology Discharge Planning Admission Note  [facility] Address: [place], [address], [place] [address] Hours of Operation:  Nena Polio, Monday - Friday  Clinic Contact Information:  [phone]) [phone]  Oncology Care Team: Medical Oncologist:  [name]  Contacted  Robert Warner  to inform that the oncology provider [name] is aware of this hospital admission dated 12/04/21, and the cancer center will follow Robert Warner inpatient care to assist with discharge planning as indicated by the oncologist.  We will reach out to you closer to discharge date to arrange your follow up care.  Disclaimer:  This [place] note does not imply a formal consult request has been made by the admitting attending for this admission or there will be an inpatient consult completed by oncology.  Please request oncology consults as per standard process as indicated.

## 2021-12-05 NOTE — Evaluation (Signed)
Occupational Therapy Evaluation Patient Details Name: Robert Warner MRN: [id number] DOB: [birth year] Today's Date: 12/05/2021   History of Present Illness Pt is a 81 y.o. M who presents with imbalance and left arm weakness. MRI showing progression of embolic appearing small infarcts scattered in the bilateral cerebral and cerebellar hemispheres since November. Many such infarcts demonstrate petechial hemorrhage. Small acute infarcts are identified in the right cerebellum, left splenium and there are multiple subacute appearing punctate white matter infarcts Significant PMH: cancer, HLD, DM2, prior embolic CVA.   Clinical Impression   Robert Warner reports being mod I for mobility with use of SPC, generally indep ADLs and requires assist from wife for IADLs. He lives in a 2 level home, bed/bath on main level with his wife who is able to assist as needed. Pt states prior to the hospital he was active with OP PT. Upon evaluation pt ws limited by general fatigue (PT session directly prior), impaired cognition (wife correcting pt throughout), limited overhead ROM, and L hip pain.  Overall he required min A for bed mobility, min A for transfers, min Guard for short ambulation with RW and up to mod A for ADLs. Pt will benefit from OT acutely to progress back towards his mod I baseline. Recommend d/c home with [place].      Recommendations for follow up therapy are one component of a multi-disciplinary discharge planning process, led by the attending physician.  Recommendations may be updated based on patient status, additional functional criteria and insurance authorization.   Follow Up Recommendations  Home health OT    Assistance Recommended at Discharge Frequent or constant Supervision/Assistance  Patient can return home with the following A little help with walking and/or transfers;A little help with bathing/dressing/bathroom;Direct supervision/assist for medications management;Assist for transportation;Help  with stairs or ramp for entrance;Assistance with cooking/housework    Functional Status Assessment  Patient has had a recent decline in their functional status and demonstrates the ability to make significant improvements in function in a reasonable and predictable amount of time.  Equipment Recommendations  None recommended by OT    Recommendations for Other Services       Precautions / Restrictions Precautions Precautions: Fall Restrictions Weight Bearing Restrictions: No      Mobility Bed Mobility Overal bed mobility: Needs Assistance Bed Mobility: Sit to Supine       Sit to supine: Min assist   General bed mobility comments: min A for LLE, painful L hip    Transfers Overall transfer level: Needs assistance Equipment used: Rolling walker (2 wheels), None Transfers: Sit to/from Stand, Bed to chair/wheelchair/BSC Sit to Stand: Min assist           General transfer comment: once up with RW, pt transitions to min G      Balance Overall balance assessment: Needs assistance Sitting-balance support: Feet supported Sitting balance-Leahy Scale: Good     Standing balance support: No upper extremity supported, During functional activity Standing balance-Leahy Scale: Fair                             ADL either performed or assessed with clinical judgement   ADL Overall ADL's : Needs assistance/impaired Eating/Feeding: Independent;Sitting   Grooming: Min guard;Standing   Upper Body Bathing: Set up;Sitting   Lower Body Bathing: Moderate assistance;Sit to/from stand   Upper Body Dressing : Supervision/safety;Set up;Sitting   Lower Body Dressing: Moderate assistance;Sit to/from stand   Toilet Transfer: Minimal assistance;Ambulation;Rolling walker (  2 wheels) Toilet Transfer Details (indicate cue type and reason): min A to power into standing, & verbal cues Toileting- Clothing Manipulation and Hygiene: Min guard;Sitting/lateral lean        Functional mobility during ADLs: Minimal assistance;Rolling walker (2 wheels) General ADL Comments: pt active     Vision Baseline Vision/History: 1 Wears glasses Ability to See in Adequate Light: 0 Adequate Patient Visual Report: No change from baseline Vision Assessment?: Yes Eye Alignment: Within Functional Limits Ocular Range of Motion: Within Functional Limits Alignment/Gaze Preference: Within Defined Limits Tracking/Visual Pursuits: Able to track stimulus in all quads without difficulty;Requires cues, head turns, or add eye shifts to track (some head turns and cues) Saccades: Within functional limits Convergence: Within functional limits Visual Fields: No apparent deficits Additional Comments: overall WFL, pt denies changes, wears glasses for reading only     Perception     Praxis      Pertinent Vitals/Pain Pain Assessment Pain Assessment: No/denies pain     Hand Dominance Right   Extremity/Trunk Assessment Upper Extremity Assessment Upper Extremity Assessment: RUE deficits/detail;LUE deficits/detail RUE Deficits / Details: Limited over head ROM, globally 4/5 RUE Sensation: WNL RUE Coordination: decreased fine motor (slow/deliberate) LUE Deficits / Details: limited over head ROM, globally 4/5 LUE Sensation: WNL LUE Coordination: decreased fine motor (better than R, but still slow)   Lower Extremity Assessment Lower Extremity Assessment: Defer to PT evaluation RLE Deficits / Details: strength 5/5 LLE Deficits / Details: strength 5/5   Cervical / Trunk Assessment Cervical / Trunk Assessment: Normal   Communication Communication Communication: No difficulties   Cognition Arousal/Alertness: Awake/alert Behavior During Therapy: WFL for tasks assessed/performed Overall Cognitive Status: History of cognitive impairments - at baseline                                 General Comments: Scored 12/30 on SLUMS with SLP. Pt wife correcting him on history.  Oriented to person and place.     General Comments  VSS on RA, wife present    Exercises     Shoulder Instructions      Home Living Family/patient expects to be discharged to:: Private residence Living Arrangements: Spouse/significant other Available Help at Discharge: Family;Available 24 hours/day Type of Home: House Home Access: Stairs to enter [employer] of Steps: garage 6 steps w/ railing; front steps 10 steps b/l railing Entrance Stairs-Rails: Can reach both Home Layout: Two level;Able to live on main level with bedroom/bathroom Alternate Level Stairs-Number of Steps: full flight does not use upstairs Alternate Level Stairs-Rails: Can reach both Bathroom Shower/Tub: Occupational psychologist: Handicapped height     Home Equipment: Shower seat;Grab bars - toilet;Grab bars - tub/shower;Adaptive equipment;Rolling Walker (2 wheels);Kasandra Knudsen - single point      Lives With: Spouse    Prior Functioning/Environment Prior Level of Function : Needs assist             Mobility Comments: cane, driving - active with OP PT ADLs Comments: assist for IADL's including cooking, cleaning, medication management        OT Problem List: Decreased strength;Decreased range of motion;Decreased activity tolerance;Impaired balance (sitting and/or standing);Decreased cognition;Decreased safety awareness;Decreased knowledge of use of DME or AE;Decreased knowledge of precautions;Pain      OT Treatment/Interventions: Self-care/ADL training;Therapeutic exercise;DME and/or AE instruction;Therapeutic activities;Patient/family education;Balance training    OT Goals(Current goals can be found in the care plan section) Acute Rehab OT Goals  Patient Stated Goal: to go home OT Goal Formulation: With patient Time For Goal Achievement: 12/19/21 Potential to Achieve Goals: Good ADL Goals Pt Will Perform Grooming: with modified independence;standing Pt Will Perform Upper Body  Dressing: with modified independence;sitting Pt Will Perform Lower Body Dressing: with modified independence;sit to/from stand Pt Will Transfer to Toilet: with modified independence;ambulating  OT Frequency: Min 2X/week    Co-evaluation              AM-PAC OT "6 Clicks" Daily Activity     Outcome Measure Help from another person eating meals?: None Help from another person taking care of personal grooming?: A Little Help from another person toileting, which includes using toliet, bedpan, or urinal?: A Little Help from another person bathing (including washing, rinsing, drying)?: A Lot Help from another person to put on and taking off regular upper body clothing?: A Little Help from another person to put on and taking off regular lower body clothing?: A Lot 6 Click Score: 17   End of Session Equipment Utilized During Treatment: Gait belt;Rolling walker (2 wheels) Nurse Communication: Mobility status  Activity Tolerance: Patient tolerated treatment well Patient left: in bed;with call bell/phone within reach;with nursing/sitter in room;with family/visitor present  OT Visit Diagnosis: Unsteadiness on feet (R26.81);Other abnormalities of gait and mobility (R26.89);Muscle weakness (generalized) (M62.81);Pain;History of falling (Z91.81)                Time: 7782-4235 OT Time Calculation (min): 14 min Charges:  OT General Charges $OT Visit: 1 Visit OT Evaluation $OT Eval Moderate Complexity: 1 Mod   Avaiyah Strubel A Ashliegh Parekh 12/05/2021, 5:15 PM

## 2021-12-05 NOTE — Progress Notes (Signed)
STROKE TEAM PROGRESS NOTE   INTERVAL HISTORY Wife at bedside.  Patient lying in bed, awake alert, not in distress.  Still has mild right upper and left lower extremity dysmetria.  Venous duplex positive for DVT in left peroneal vein.  Given his hypercoagulable state but hemorrhagic transformation at left cerebellum, put on IV heparin.  Vitals:   12/05/21 0300 12/05/21 0400 12/05/21 0600 12/05/21 0742  BP: 115/85 115/81 109/73 (!) 144/89  Pulse: 85 91 92 89  Resp: 16 12 12 13   Temp:  98 F (36.7 C)  98.9 F (37.2 C)  TempSrc:  Oral  Oral  SpO2: 97% 96% 97% (!) 86%  Weight:      Height:       CBC:  Recent Labs  Lab 11/30/21 1010 11/30/21 1010 12/04/21 1309 12/04/21 1340 12/05/21 0425  WBC 9.5  --  4.9  --  5.1  NEUTROABS 7.7  --  4.7  --   --   HGB 11.8*   < > 11.3* 12.2* 11.2*  HCT 35.2*  --  33.8* 36.0* 32.7*  MCV 83.8  --  84.3  --  83.8  PLT 88*  --  75*  --  56*   < > = values in this interval not displayed.   Basic Metabolic Panel:  Recent Labs  Lab 12/04/21 2257 12/05/21 0425 12/05/21 0645  NA 133* 133*  --   K 4.1 3.6  --   CL 95* 99  --   CO2 26 25  --   GLUCOSE 290* 150*  --   BUN 34* 31*  --   CREATININE 1.54* 1.42*  --   CALCIUM 8.7* 8.2*  --   MG  --   --  1.6*   Lipid Panel:  Recent Labs  Lab 12/05/21 0425  CHOL 118  TRIG 120  HDL 35*  CHOLHDL 3.4  VLDL 24  LDLCALC 59   HgbA1c: No results for input(s): HGBA1C in the last 168 hours. Urine Drug Screen:  Recent Labs  Lab 12/04/21 1500  LABOPIA NONE DETECTED  COCAINSCRNUR NONE DETECTED  LABBENZ NONE DETECTED  AMPHETMU NONE DETECTED  THCU NONE DETECTED  LABBARB NONE DETECTED    Alcohol Level  Recent Labs  Lab 12/04/21 1309  ETH <10    IMAGING past 24 hours DG Chest Port 1 View  Result Date: 12/05/2021 CLINICAL DATA:  81 year old male with shortness of breath. Metastatic disease unknown primary. EXAM: PORTABLE CHEST 1 VIEW COMPARISON:  Chest radiographs 09/20/2021 and earlier.  FINDINGS: Portable AP semi upright view at 0610 hours. Right chest power port is new since last year. Lower lung volumes. Mediastinal contours remain within normal limits. Visualized tracheal air column is within normal limits. Diffuse crowding of lung markings with no pneumothorax, pleural effusion, consolidation, or overt edema. No lung nodule identified. Visible osseous structures within normal limits. Paucity of bowel gas. IMPRESSION: Low lung volumes, otherwise no acute cardiopulmonary abnormality. Electronically Signed   By: Genevie Ann M.D.   On: 12/05/2021 06:42   CT ANGIO HEAD NECK W WO CM W PERF (CODE STROKE)  Result Date: 12/04/2021 CLINICAL DATA:  Acute neuro deficit. Left-sided weakness. History of metastatic disease. EXAM: CT ANGIOGRAPHY HEAD AND NECK CT PERFUSION BRAIN TECHNIQUE: Multidetector CT imaging of the head and neck was performed using the standard protocol during bolus administration of intravenous contrast. Multiplanar CT image reconstructions and MIPs were obtained to evaluate the vascular anatomy. Carotid stenosis measurements (when applicable) are obtained utilizing NASCET  criteria, using the distal internal carotid diameter as the denominator. Multiphase CT imaging of the brain was performed following IV bolus contrast injection. Subsequent parametric perfusion maps were calculated using RAPID software. RADIATION DOSE REDUCTION: This exam was performed according to the departmental dose-optimization program which includes automated exposure control, adjustment of the mA and/or kV according to patient size and/or use of iterative reconstruction technique. CONTRAST:  164mL OMNIPAQUE IOHEXOL 350 MG/ML SOLN COMPARISON:  CT head 11/18/2021 FINDINGS: CT HEAD FINDINGS Brain: Generalized atrophy. Chronic microvascular ischemic change throughout the white matter. Chronic infarct right thalamus. Interval contraction of hypodensity in the right cerebellum compatible with resolving infarct. Chronic  infarct left superior cerebellum. Hypodensity in the central right cerebellum has progressed from prior studies and may be recent extension of a previously noted infarct. This measures 1 cm. Chronic infarct right superior cerebellum unchanged Negative for hemorrhage or mass. Vascular: Negative for hyperdense vessel Skull: Negative Sinuses: Paranasal sinuses clear Orbits: Bilateral cataract extraction. ASPECTS ([place] Stroke Program Early CT Score) - Ganglionic level infarction (caudate, lentiform nuclei, internal capsule, insula, M1-M3 cortex): 7 - Supraganglionic infarction (M4-M6 cortex): 3 Total score (0-10 with 10 being normal): 10 Review of the MIP images confirms the above findings CTA NECK FINDINGS Aortic arch: Normal aortic arch. Proximal great vessels widely patent. Bovine branching arch. Right carotid system: Right carotid widely patent without stenosis or atherosclerotic disease. Left carotid system: Left carotid widely patent. Negative for atherosclerotic disease or stenosis Vertebral arteries: Both vertebral arteries widely patent to the skull base. No significant stenosis. Skeleton: Degenerative changes in the cervical spine. No acute skeletal abnormality. Other neck: Negative for mass or edema in the neck. Port-A-Cath tip right jugular vein. Upper chest: 8.5 mm spiculated nodule right upper lobe unchanged from PET-CT 10/10/2021. No other lung nodule in the apices. Review of the MIP images confirms the above findings CTA HEAD FINDINGS Anterior circulation: Cavernous carotid widely patent bilaterally. Anterior and middle cerebral arteries widely patent bilaterally. No stenosis or vascular malformation. Posterior circulation: Both vertebral arteries patent to the basilar. Basilar widely patent. PICA patent bilaterally. AICA, superior cerebellar, posterior cerebral arteries patent without stenosis or large vessel occlusion. Negative for cerebrovascular malformation. Venous sinuses: Normal venous  enhancement Anatomic variants: None Review of the MIP images confirms the above findings CT Brain Perfusion Findings: ASPECTS: 10 CBF (<30%) Volume: 14mL Perfusion (Tmax>6.0s) volume: 63mL Mismatch Volume: 71mL Infarction Location:None IMPRESSION: 1. CT perfusion negative for acute infarct or ischemia 2. Negative for intracranial large vessel occlusion 3. No carotid or vertebral artery stenosis in the neck. No significant atherosclerotic disease. 4. CT head demonstrates new hypodensity right mid cerebellum. This could represent interval acute infarct or possibly metastatic disease given history. Follow-up MRI brain without and with contrast recommended. Additional chronic ischemic changes stable from prior studies. 5. 8.5 mm spiculated nodule right upper lobe, stable from prior PET-CT 10/10/2021 Electronically Signed   By: Franchot Gallo M.D.   On: 12/04/2021 14:08    PHYSICAL EXAM  Temp:  [98 F (36.7 C)-98.9 F (37.2 C)] 98.4 F (36.9 C) (01/23 1614) Pulse Rate:  [84-114] 91 (01/23 1924) Resp:  [10-22] 21 (01/23 1924) BP: (89-180)/(64-127) 114/84 (01/23 1924) SpO2:  [86 %-99 %] 94 % (01/23 1924) Weight:  [81.7 kg] 81.7 kg (01/22 2049)  General - Well nourished, well developed, in no apparent distress, port at right chest.  Ophthalmologic - fundi not visualized due to noncooperation.  Cardiovascular - Regular rhythm and rate.  Neuro - awake, alert, eyes  open, orientated to age, place, time and people. No aphasia, paucity of speech,, following all simple commands. Able to name and repeat and read. No gaze palsy, no nystagmus, tracking bilaterally, visual field full. No facial droop. Tongue midline. Bilateral UEs 5/5, no drift. Bilaterally LEs 5/5, no drift. Sensation symmetrical bilaterally, right finger-to-nose and left heel-to-shin mild dysmetria, gait not tested.     ASSESSMENT/PLAN Mr. HJALMER IOVINO is a 81 y.o. male with history of T2DM, HTN, hx of embolic stroke, HLD, CKD stage 3,  unknown primary cancer presenting with feeling off balance from baseline. LKW around 9:30pm. Concern for new stroke. Progression of embolic appearing small infarcts scattered in the bilateral cerebral and cerebellar hemispheres since November. Many such infarcts demonstrate petechial hemorrhage. Small acute infarcts are identified in the right cerebellum, left splenium and there are multiple subacute appearing punctate white matter infarcts. He has recent history of embolic infarcts in November 2022 with work-up positive for right-to-left shunt.  He was on Plavix since November 2022. Unable able to start DAPT due to pending liver biopsy. Follow up CT suspicious bilateral hypodensity, MRI pending. Consider switching DAPT to Eliquis due to platelet levels. Lower extremity venous duplex positive for DVT in left peroneal veins.  Stroke: Embolic shower involving bilateral MCA, left splenium, left occipital and bilateral cerebellum with mild hemorrhagic transformation, likely due to hypercarbia state secondary to advanced malignancy vs. DVT in the setting of PFO CT 1/6 New "lesion" in the left cerebellum when compared to brain MRI 09/16/2021.  CT 1/22 CT head demonstrates new hypodensity right mid cerebellum.  CTA head & neck unremarkable CT perfusion- negative MRI  Progression of embolic appearing infarcts scattered in the bilateral cerebral and cerebellar hemispheres since November. Many such infarcts demonstrate petechial hemorrhage including confluent new petechial hemorrhage in the left cerebellum. Small acute infarcts are identified in the right cerebellum, left splenium. And there are multiple subacute appearing punctate white matter infarcts. 2D Echo EF 50-55%, no regional wall abnormalities LE venous Doppler findings consistent with age indeterminate deep vein thrombosis involving the left peroneal veins.  LDL 59 HgbA1c 6.9 VTE prophylaxis - IV Heparin clopidogrel 75 mg daily prior to admission, now  on heparin IV given hypercoagulable state from advanced malignancy and DVT with PFO.  Once tolerating IV heparin for 48 hours, may switch to Eliquis. Therapy recommendations:  Outpatient PT/OT Disposition:  pending  History of stroke 09/2021 patient admitted for imbalance, difficult gait.  MRI showed embolic shower.  TCD bubble study showed positive PFO.  CTA head and neck negative except right lung nodule.  EF 60 to 60%.  DVT negative.  LDL 97, A1c 6.9.  He was put on DAPT after liver biopsy and pravastatin 40. ROPE score = 2.  Hypertension Home meds:  None Stable Long-term BP goal normotensive  Hyperlipidemia Home meds:  Atorvastatin 40mg , resumed in hospital LDL 59, goal < 70 Continue statin at discharge  Diabetes type II Uncontrolled Home meds:  Insulin HgbA1c 6.9, goal < 7.0 CBGs SSI  PFO LLE DVT 2D echo 09/17/2021 showed positive PFO PFO confirmed on TCD bubble study in 09/2021 This admission LE venous Doppler showed left lower extremity DVT Currently on anticoagulation with heparin IV.  Once tolerating heparin IV for 2 days, may switch to Eliquis  Other Stroke Risk Factors Advanced Age >/= 50   Other Active Problems AKI  Hyponatremia and hypomagnesmia Metastatic liver cancer  Dr. Rondel Oh Liver biopsy in 09/2021 showed possible primary from pancrebiliary source. Finished 4th  cycle of FOLFOX on 11/30/2021 Acute on chronic hip pain  Hospital day # 0  I had long discussion with patient and wife at bedside, updated pt current condition, treatment plan and potential prognosis, and answered all the questions.  They expressed understanding and appreciation.  I also discussed with Dr. Candiss Norse. I spent extensive time in counseling and coordination of care, reviewing test results, images and medication, and discussing the diagnosis, treatment plan and potential prognosis. This patient's care requiresreview of multiple databases, neurological assessment, discussion with family,  other specialists and medical decision making of high complexity.  Rosalin Hawking, MD PhD Stroke Neurology 12/05/2021 8:21 PM   To contact Stroke Continuity provider, please refer to [email or web address] After hours, contact General Neurology

## 2021-12-05 NOTE — Progress Notes (Signed)
PHARMACY - PHYSICIAN COMMUNICATION CRITICAL VALUE ALERT - BLOOD CULTURE IDENTIFICATION (BCID)  Robert Warner is an 81 y.o. male who presented to [facility] on 12/04/2021 with a chief complaint of dizziness  Assessment:  Two sets of blood cultures (all 4 bottles) positive for K.pneuminiae - patient admitted for stroke workup.  Blood cultures drawn earlier today due to chills and rigors  Name of physician (or Provider) Contacted: Dr. Candiss Norse  Current antibiotics: none  Changes to prescribed antibiotics recommended: Start ceftriaxone 2g IV q24h Recommendations accepted by provider  Results for orders placed or performed during the hospital encounter of 12/04/21  Blood Culture ID Panel (Reflexed) (Collected: 12/05/2021 12:46 AM)  Result Value Ref Range   Enterococcus faecalis NOT DETECTED NOT DETECTED   Enterococcus Faecium NOT DETECTED NOT DETECTED   Listeria monocytogenes NOT DETECTED NOT DETECTED   Staphylococcus species NOT DETECTED NOT DETECTED   Staphylococcus aureus (BCID) NOT DETECTED NOT DETECTED   Staphylococcus epidermidis NOT DETECTED NOT DETECTED   Staphylococcus lugdunensis NOT DETECTED NOT DETECTED   Streptococcus species NOT DETECTED NOT DETECTED   Streptococcus agalactiae NOT DETECTED NOT DETECTED   Streptococcus pneumoniae NOT DETECTED NOT DETECTED   Streptococcus pyogenes NOT DETECTED NOT DETECTED   A.calcoaceticus-baumannii NOT DETECTED NOT DETECTED   Bacteroides fragilis NOT DETECTED NOT DETECTED   Enterobacterales DETECTED (A) NOT DETECTED   Enterobacter cloacae complex NOT DETECTED NOT DETECTED   Escherichia coli NOT DETECTED NOT DETECTED   Klebsiella aerogenes NOT DETECTED NOT DETECTED   Klebsiella oxytoca NOT DETECTED NOT DETECTED   Klebsiella pneumoniae DETECTED (A) NOT DETECTED   Proteus species NOT DETECTED NOT DETECTED   Salmonella species NOT DETECTED NOT DETECTED   Serratia marcescens NOT DETECTED NOT DETECTED   Haemophilus influenzae NOT DETECTED NOT  DETECTED   Neisseria meningitidis NOT DETECTED NOT DETECTED   Pseudomonas aeruginosa NOT DETECTED NOT DETECTED   Stenotrophomonas maltophilia NOT DETECTED NOT DETECTED   Candida albicans NOT DETECTED NOT DETECTED   Candida auris NOT DETECTED NOT DETECTED   Candida glabrata NOT DETECTED NOT DETECTED   Candida krusei NOT DETECTED NOT DETECTED   Candida parapsilosis NOT DETECTED NOT DETECTED   Candida tropicalis NOT DETECTED NOT DETECTED   Cryptococcus neoformans/gattii NOT DETECTED NOT DETECTED   CTX-M ESBL NOT DETECTED NOT DETECTED   Carbapenem resistance IMP NOT DETECTED NOT DETECTED   Carbapenem resistance KPC NOT DETECTED NOT DETECTED   Carbapenem resistance NDM NOT DETECTED NOT DETECTED   Carbapenem resist OXA 48 LIKE NOT DETECTED NOT DETECTED   Carbapenem resistance VIM NOT DETECTED NOT DETECTED    [name] 12/05/2021  1:39 PM

## 2021-12-05 NOTE — Plan of Care (Signed)
Problem: Education: Goal: Knowledge of General Education information will improve Description: Including pain rating scale, medication(s)/side effects and non-pharmacologic comfort measures 12/05/2021 0349 by Brooke Bonito, RN Outcome: Not Progressing 12/05/2021 0349 by Brooke Bonito, RN Outcome: Not Progressing   Problem: Health Behavior/Discharge Planning: Goal: Ability to manage health-related needs will improve 12/05/2021 0349 by Brooke Bonito, RN Outcome: Not Progressing 12/05/2021 0349 by Brooke Bonito, RN Outcome: Not Progressing   Problem: Clinical Measurements: Goal: Ability to maintain clinical measurements within normal limits will improve 12/05/2021 0349 by Brooke Bonito, RN Outcome: Not Progressing 12/05/2021 0349 by Brooke Bonito, RN Outcome: Not Progressing Goal: Will remain free from infection 12/05/2021 0349 by Brooke Bonito, RN Outcome: Not Progressing 12/05/2021 0349 by Brooke Bonito, RN Outcome: Not Progressing Goal: Diagnostic test results will improve 12/05/2021 0349 by Brooke Bonito, RN Outcome: Not Progressing 12/05/2021 0349 by Brooke Bonito, RN Outcome: Not Progressing Goal: Respiratory complications will improve 12/05/2021 0349 by Brooke Bonito, RN Outcome: Not Progressing 12/05/2021 0349 by Brooke Bonito, RN Outcome: Not Progressing Goal: Cardiovascular complication will be avoided 12/05/2021 0349 by Brooke Bonito, RN Outcome: Not Progressing 12/05/2021 0349 by Brooke Bonito, RN Outcome: Not Progressing   Problem: Activity: Goal: Risk for activity intolerance will decrease 12/05/2021 0349 by Brooke Bonito, RN Outcome: Not Progressing 12/05/2021 0349 by Brooke Bonito, RN Outcome: Not Progressing   Problem: Nutrition: Goal: Adequate nutrition will be maintained 12/05/2021 0349 by Brooke Bonito, RN Outcome: Not Progressing 12/05/2021 0349 by Brooke Bonito, RN Outcome: Not Progressing   Problem: Coping: Goal: Level of anxiety will decrease 12/05/2021  0349 by Brooke Bonito, RN Outcome: Not Progressing 12/05/2021 0349 by Brooke Bonito, RN Outcome: Not Progressing   Problem: Elimination: Goal: Will not experience complications related to bowel motility 12/05/2021 0349 by Brooke Bonito, RN Outcome: Not Progressing 12/05/2021 0349 by Brooke Bonito, RN Outcome: Not Progressing Goal: Will not experience complications related to urinary retention 12/05/2021 0349 by Brooke Bonito, RN Outcome: Not Progressing 12/05/2021 0349 by Brooke Bonito, RN Outcome: Not Progressing   Problem: Pain Managment: Goal: General experience of comfort will improve 12/05/2021 0349 by Brooke Bonito, RN Outcome: Not Progressing 12/05/2021 0349 by Brooke Bonito, RN Outcome: Not Progressing   Problem: Safety: Goal: Ability to remain free from injury will improve 12/05/2021 0349 by Brooke Bonito, RN Outcome: Not Progressing 12/05/2021 0349 by Brooke Bonito, RN Outcome: Not Progressing   Problem: Skin Integrity: Goal: Risk for impaired skin integrity will decrease 12/05/2021 0349 by Brooke Bonito, RN Outcome: Not Progressing 12/05/2021 0349 by Brooke Bonito, RN Outcome: Not Progressing   Problem: Education: Goal: Knowledge of disease or condition will improve 12/05/2021 0349 by Brooke Bonito, RN Outcome: Not Progressing 12/05/2021 0349 by Brooke Bonito, RN Outcome: Not Progressing Goal: Knowledge of secondary prevention will improve (SELECT ALL) 12/05/2021 0349 by Brooke Bonito, RN Outcome: Not Progressing 12/05/2021 0349 by Brooke Bonito, RN Outcome: Not Progressing Goal: Knowledge of patient specific risk factors will improve (INDIVIDUALIZE FOR PATIENT) 12/05/2021 0349 by Brooke Bonito, RN Outcome: Not Progressing 12/05/2021 0349 by Brooke Bonito, RN Outcome: Not Progressing Goal: Individualized Educational Video(s) 12/05/2021 0349 by Brooke Bonito, RN Outcome: Not Progressing 12/05/2021 0349 by Brooke Bonito, RN Outcome: Not Progressing   Problem:  Coping: Goal: Will verbalize positive feelings about self 12/05/2021 0349 by Brooke Bonito, RN Outcome: Not Progressing 12/05/2021 0349 by Brooke Bonito, RN Outcome: Not Progressing Goal: Will identify appropriate support needs 12/05/2021 0349 by Brooke Bonito, RN Outcome: Not Progressing 12/05/2021 0349 by Brooke Bonito, RN Outcome: Not Progressing  Problem: Health Behavior/Discharge Planning: Goal: Ability to manage health-related needs will improve 12/05/2021 0349 by Brooke Bonito, RN Outcome: Not Progressing 12/05/2021 0349 by Brooke Bonito, RN Outcome: Not Progressing   Problem: Self-Care: Goal: Ability to participate in self-care as condition permits will improve 12/05/2021 0349 by Brooke Bonito, RN Outcome: Not Progressing 12/05/2021 0349 by Brooke Bonito, RN Outcome: Not Progressing Goal: Verbalization of feelings and concerns over difficulty with self-care will improve 12/05/2021 0349 by Brooke Bonito, RN Outcome: Not Progressing 12/05/2021 0349 by Brooke Bonito, RN Outcome: Not Progressing Goal: Ability to communicate needs accurately will improve 12/05/2021 0349 by Brooke Bonito, RN Outcome: Not Progressing 12/05/2021 0349 by Brooke Bonito, RN Outcome: Not Progressing   Problem: Nutrition: Goal: Risk of aspiration will decrease 12/05/2021 0349 by Brooke Bonito, RN Outcome: Not Progressing 12/05/2021 0349 by Brooke Bonito, RN Outcome: Not Progressing Goal: Dietary intake will improve 12/05/2021 0349 by Brooke Bonito, RN Outcome: Not Progressing 12/05/2021 0349 by Brooke Bonito, RN Outcome: Not Progressing   Problem: Intracerebral Hemorrhage Tissue Perfusion: Goal: Complications of Intracerebral Hemorrhage will be minimized 12/05/2021 0349 by Brooke Bonito, RN Outcome: Not Progressing 12/05/2021 0349 by Brooke Bonito, RN Outcome: Not Progressing   Problem: Ischemic Stroke/TIA Tissue Perfusion: Goal: Complications of ischemic stroke/TIA will be minimized 12/05/2021 0349  by Brooke Bonito, RN Outcome: Not Progressing 12/05/2021 0349 by Brooke Bonito, RN Outcome: Not Progressing   Problem: Spontaneous Subarachnoid Hemorrhage Tissue Perfusion: Goal: Complications of Spontaneous Subarachnoid Hemorrhage will be minimized 12/05/2021 0349 by Brooke Bonito, RN Outcome: Not Progressing 12/05/2021 0349 by Brooke Bonito, RN Outcome: Not Progressing

## 2021-12-05 NOTE — Plan of Care (Signed)
°  Problem: Education: Goal: Knowledge of General Education information will improve Description: Including pain rating scale, medication(s)/side effects and non-pharmacologic comfort measures Outcome: Not Progressing   Problem: Health Behavior/Discharge Planning: Goal: Ability to manage health-related needs will improve Outcome: Not Progressing   Problem: Clinical Measurements: Goal: Ability to maintain clinical measurements within normal limits will improve Outcome: Not Progressing Goal: Will remain free from infection Outcome: Not Progressing Goal: Diagnostic test results will improve Outcome: Not Progressing Goal: Respiratory complications will improve Outcome: Not Progressing Goal: Cardiovascular complication will be avoided Outcome: Not Progressing   Problem: Activity: Goal: Risk for activity intolerance will decrease Outcome: Not Progressing   Problem: Nutrition: Goal: Adequate nutrition will be maintained Outcome: Not Progressing   Problem: Coping: Goal: Level of anxiety will decrease Outcome: Not Progressing   Problem: Elimination: Goal: Will not experience complications related to bowel motility Outcome: Not Progressing Goal: Will not experience complications related to urinary retention Outcome: Not Progressing   Problem: Pain Managment: Goal: General experience of comfort will improve Outcome: Not Progressing   Problem: Safety: Goal: Ability to remain free from injury will improve Outcome: Not Progressing   Problem: Skin Integrity: Goal: Risk for impaired skin integrity will decrease Outcome: Not Progressing   Problem: Education: Goal: Knowledge of disease or condition will improve Outcome: Not Progressing Goal: Knowledge of secondary prevention will improve (SELECT ALL) Outcome: Not Progressing Goal: Knowledge of patient specific risk factors will improve (INDIVIDUALIZE FOR PATIENT) Outcome: Not Progressing Goal: Individualized Educational  Video(s) Outcome: Not Progressing   Problem: Coping: Goal: Will verbalize positive feelings about self Outcome: Not Progressing Goal: Will identify appropriate support needs Outcome: Not Progressing   Problem: Health Behavior/Discharge Planning: Goal: Ability to manage health-related needs will improve Outcome: Not Progressing   Problem: Self-Care: Goal: Ability to participate in self-care as condition permits will improve Outcome: Not Progressing Goal: Verbalization of feelings and concerns over difficulty with self-care will improve Outcome: Not Progressing Goal: Ability to communicate needs accurately will improve Outcome: Not Progressing   Problem: Nutrition: Goal: Risk of aspiration will decrease Outcome: Not Progressing Goal: Dietary intake will improve Outcome: Not Progressing   Problem: Intracerebral Hemorrhage Tissue Perfusion: Goal: Complications of Intracerebral Hemorrhage will be minimized Outcome: Not Progressing   Problem: Ischemic Stroke/TIA Tissue Perfusion: Goal: Complications of ischemic stroke/TIA will be minimized Outcome: Not Progressing   Problem: Spontaneous Subarachnoid Hemorrhage Tissue Perfusion: Goal: Complications of Spontaneous Subarachnoid Hemorrhage will be minimized Outcome: Not Progressing

## 2021-12-05 NOTE — Evaluation (Signed)
Physical Therapy Evaluation Patient Details Name: Robert Warner MRN: [id number] DOB: Jul 21, 1941 Today's Date: 12/05/2021  History of Present Illness  Pt is a 81 y.o. M who presents with imbalance and left arm weakness. MRI showing progression of embolic appearing small infarcts scattered in the bilateral cerebral and cerebellar hemispheres since November. Many such infarcts demonstrate petechial hemorrhage. Small acute infarcts are identified in the right cerebellum, left splenium and there are multiple subacute appearing punctate white matter infarcts Significant PMH: cancer, HLD, DM2, prior embolic CVA.  Clinical Impression  PTA, pt lives with his spouse, requires assist for IADL's and uses a cane for mobility. Pt presents with LUE weakness, cognitive impairments, and balance deficits. Pt requiring up to min assist for functional mobility. Ambulating 100 feet with a walker; demonstrates mild left lateral drift, but able to correct with cueing. Would benefit from follow up HHPT to address deficits and maximize functional mobility.      Recommendations for follow up therapy are one component of a multi-disciplinary discharge planning process, led by the attending physician.  Recommendations may be updated based on patient status, additional functional criteria and insurance authorization.  Follow Up Recommendations Home health PT    Assistance Recommended at Discharge Frequent or constant Supervision/Assistance  Patient can return home with the following  A little help with walking and/or transfers;A little help with bathing/dressing/bathroom;Assistance with cooking/housework;Direct supervision/assist for medications management    Equipment Recommendations None recommended by PT  Recommendations for Other Services       Functional Status Assessment Patient has had a recent decline in their functional status and demonstrates the ability to make significant improvements in function in a  reasonable and predictable amount of time.     Precautions / Restrictions Precautions Precautions: Fall Restrictions Weight Bearing Restrictions: No      Mobility  Bed Mobility Overal bed mobility: Needs Assistance Bed Mobility: Supine to Sit     Supine to sit: Supervision     General bed mobility comments: increased time    Transfers Overall transfer level: Needs assistance Equipment used: Rolling walker (2 wheels), None Transfers: Sit to/from Stand, Bed to chair/wheelchair/BSC Sit to Stand: Min assist Stand pivot transfers: Min guard         General transfer comment: light minA to rise to stand with and without RW. pivoting to and from [facility] with min guard assist    Ambulation/Gait Ambulation/Gait assistance: Min assist Gait Distance (Feet): 100 Feet Assistive device: Rolling walker (2 wheels) Gait Pattern/deviations: Step-through pattern, Decreased stride length Gait velocity: decreased     General Gait Details: Drift towards left, able to correct with verbal cueing.  Stairs            Wheelchair Mobility    Modified Rankin (Stroke Patients Only) Modified Rankin (Stroke Patients Only) Pre-Morbid Rankin Score: Moderate disability Modified Rankin: Moderately severe disability     Balance Overall balance assessment: Needs assistance Sitting-balance support: Feet supported Sitting balance-Leahy Scale: Good     Standing balance support: No upper extremity supported, During functional activity Standing balance-Leahy Scale: Fair                               Pertinent Vitals/Pain Pain Assessment Pain Assessment: No/denies pain    Home Living Family/patient expects to be discharged to:: Private residence Living Arrangements: Spouse/significant other Available Help at Discharge: Family;Available 24 hours/day Type of Home: House Home Access: Stairs to enter Entrance Stairs-Rails:  Can reach both Entrance Stairs-Number of Steps:  garage 6 steps w/ railing; front steps 10 steps b/l railing Alternate Level Stairs-Number of Steps: full flight does not use upstairs Home Layout: Two level;Able to live on main level with bedroom/bathroom Home Equipment: Shower seat;Grab bars - toilet;Grab bars - tub/shower;Adaptive equipment;Rolling Walker (2 wheels);Cane - single point      Prior Function Prior Level of Function : Needs assist             Mobility Comments: cane, driving ADLs Comments: assist for IADL's including cooking, cleaning, medication management     Hand Dominance   Dominant Hand: Right    Extremity/Trunk Assessment   Upper Extremity Assessment Upper Extremity Assessment: Defer to OT evaluation    Lower Extremity Assessment Lower Extremity Assessment: RLE deficits/detail;LLE deficits/detail RLE Deficits / Details: strength 5/5 LLE Deficits / Details: strength 5/5    Cervical / Trunk Assessment Cervical / Trunk Assessment: Normal  Communication   Communication: No difficulties  Cognition Arousal/Alertness: Awake/alert Behavior During Therapy: WFL for tasks assessed/performed Overall Cognitive Status: History of cognitive impairments - at baseline                                 General Comments: Scored 12/30 on SLUMS with SLP. Pt wife correcting him on history        General Comments      Exercises     Assessment/Plan    PT Assessment Patient needs continued PT services  PT Problem List Decreased strength;Decreased activity tolerance;Decreased mobility;Decreased balance;Decreased cognition;Decreased safety awareness       PT Treatment Interventions DME instruction;Gait training;Functional mobility training;Stair training;Therapeutic activities;Balance training;Therapeutic exercise;Patient/family education    PT Goals (Current goals can be found in the Care Plan section)  Acute Rehab PT Goals Patient Stated Goal: get stronger PT Goal Formulation: With  patient Time For Goal Achievement: 12/19/21 Potential to Achieve Goals: Good    Frequency Min 4X/week     Co-evaluation               AM-PAC PT "6 Clicks" Mobility  Outcome Measure Help needed turning from your back to your side while in a flat bed without using bedrails?: None Help needed moving from lying on your back to sitting on the side of a flat bed without using bedrails?: A Little Help needed moving to and from a bed to a chair (including a wheelchair)?: A Little Help needed standing up from a chair using your arms (e.g., wheelchair or bedside chair)?: A Little Help needed to walk in hospital room?: A Little Help needed climbing 3-5 steps with a railing? : A Lot 6 Click Score: 18    End of Session Equipment Utilized During Treatment: Gait belt Activity Tolerance: Patient tolerated treatment well Patient left: in bed;with call bell/phone within reach;with family/visitor present;Other (comment) (with OT) Nurse Communication: Mobility status PT Visit Diagnosis: Unsteadiness on feet (R26.81)    Time: 8119-1478 PT Time Calculation (min) (ACUTE ONLY): 39 min   Charges:   PT Evaluation $PT Eval Moderate Complexity: 1 Mod PT Treatments $Gait Training: 8-22 mins $Therapeutic Activity: 8-22 mins        Wyona Almas, PT, DPT Acute Rehabilitation Services Pager [phone] Office [phone]   Deno Etienne 12/05/2021, 5:02 PM

## 2021-12-05 NOTE — Progress Notes (Signed)
ANTICOAGULATION CONSULT NOTE - Initial Consult  Pharmacy Consult for heparin Indication: stroke  No Known Allergies  Patient Measurements: Height: 5' 10"  (177.8 cm) Weight: 81.7 kg (180 lb 1.9 oz) IBW/kg (Calculated) : 73 Heparin Dosing Weight: 80kg  Vital Signs: Temp: 98.9 F (37.2 C) (01/23 0742) Temp Source: Oral (01/23 0742) BP: 144/89 (01/23 0742) Pulse Rate: 89 (01/23 0742)  Labs: Recent Labs    12/04/21 1309 12/04/21 1340 12/04/21 2257 12/05/21 0425  HGB 11.3* 12.2*  --  11.2*  HCT 33.8* 36.0*  --  32.7*  PLT 75*  --   --  56*  APTT 28  --   --   --   LABPROT 14.7  --   --   --   INR 1.2  --   --   --   CREATININE 2.07*  --  1.54* 1.42*    Estimated Creatinine Clearance: 41.4 mL/min (A) (by C-G formula based on SCr of 1.42 mg/dL (H)).   Medical History: Past Medical History:  Diagnosis Date   Arthritis    Cancer ([place])    Cataract    Diabetes mellitus    Hyperlipidemia    Stroke ([place])     Medications:  Medications Prior to Admission  Medication Sig Dispense Refill Last Dose   acetaminophen (TYLENOL) 650 MG CR tablet Take 650 mg by mouth every 8 (eight) hours as needed for pain.   Past Week   atorvastatin (LIPITOR) 40 MG tablet Take 1 tablet (40 mg total) by mouth daily. 90 tablet 1 12/04/2021   Cholecalciferol (VITAMIN D3) 2000 UNITS TABS Take 2,000 Units by mouth daily.   12/04/2021   lidocaine-prilocaine (EMLA) cream Apply 1 application topically as needed. Apply 1/2 tablespoon to port site 2 hours prior to stick and cover with Press-and-Seal to numb site for access (Patient taking differently: Apply 1 application topically daily as needed (port site access).) 30 g 1 Past Month   metFORMIN (GLUCOPHAGE-XR) 750 MG 24 hr tablet TAKE 1 TABLET BY MOUTH EVERY DAY WITH BREAKFAST (Patient taking differently: Take 750 mg by mouth daily with breakfast.) 90 tablet 0 12/04/2021   ondansetron (ZOFRAN) 8 MG tablet Take 1 tablet (8 mg total) by mouth every 8 (eight)  hours as needed for nausea or vomiting. Start 72 hours after each IV chemotherapy administration 30 tablet 1 Past Month   prochlorperazine (COMPAZINE) 10 MG tablet Take 1 tablet (10 mg total) by mouth every 6 (six) hours as needed for nausea. 60 tablet 1 Past Month   silodosin (RAPAFLO) 8 MG CAPS capsule Take 8 mg by mouth at bedtime.   Past Week   traMADol (ULTRAM) 50 MG tablet Take 1 tablet (50 mg total) by mouth every 6 (six) hours as needed for moderate pain. 60 tablet 0 Past Week   TRAVATAN Z 0.004 % SOLN ophthalmic solution Place 1 drop into both eyes at bedtime.   Past Week   Accu-Chek Softclix Lancets lancets Use to test blood sugar levels once a day. 100 each 12    Blood Glucose Monitoring Suppl (ACCU-CHEK AVIVA PLUS) w/Device KIT Use to check blood sugar once a day 1 kit 0    clopidogrel (PLAVIX) 75 MG tablet Take 75 mg by mouth daily.   12/03/2021   Continuous Blood Gluc Receiver (DEXCOM G6 RECEIVER) DEVI Use to check blood sugar levels as directed. 1 each 0    Continuous Blood Gluc Sensor (DEXCOM G6 SENSOR) MISC Use to check blood glucose levels as directed. 3  each 1    glucose blood test strip Use as instructed 100 each 12    insulin glargine, 2 Unit Dial, (TOUJEO MAX SOLOSTAR) 300 UNIT/ML Solostar Pen Inject 10 Units into the skin daily. (Patient taking differently: Inject 20 Units into the skin daily.) 3 mL 1 12/03/2021   insulin lispro (HUMALOG KWIKPEN) 200 UNIT/ML KwikPen Give 5 units [place] twice daily- with breakfast and supper. (Patient not taking: Reported on 12/05/2021) 3 mL 2 Not Taking   pantoprazole (PROTONIX) 20 MG tablet TAKE 1 TABLET(20 MG) BY MOUTH DAILY (Patient not taking: Reported on 12/05/2021) 90 tablet 0 Not Taking   Scheduled:   atorvastatin  40 mg Oral Daily   Chlorhexidine Gluconate Cloth  6 each Topical Daily   insulin aspart  0-15 Units Subcutaneous TID WC   insulin glargine-yfgn  20 Units Subcutaneous Daily   Infusions:   sodium chloride Stopped (12/05/21  1442)   cefTRIAXone (ROCEPHIN)  IV 2 g (12/05/21 1353)   heparin     lactated ringers 100 mL/hr at 12/05/21 1442    Assessment: Pt who presented with dizziness was found to have a new CVA. He has a hx of metastatic adenocarcinoma. Plan is to do heparin bridging before going to PO anticoagulation. Pt was on Plavix prior to admission.   Scr 1.42 Hgb 11.2, plt 56  Goal of Therapy:  Heparin level 0.3-0.5 units/ml Monitor platelets by anticoagulation protocol: Yes   Plan:  Heparin 1200 units/hr Check 8 hr HL  Daily HL and CBC F/u PO AC  Onnie Boer, PharmD, BCIDP, AAHIVP, CPP Infectious Disease Pharmacist 12/05/2021 3:04 PM

## 2021-12-05 NOTE — Progress Notes (Signed)
°  Transition of Care [facility]) Screening Note   Patient Details  Name: ANDER WAMSER Date of Birth: [birth year]   Transition of Care [facility]) CM/SW Contact:    Benard Halsted, LCSW Phone Number: 12/05/2021, 8:50 AM    Transition of Care Department [facility]) has reviewed patient and no TOC needs have been identified at this time. We will continue to monitor patient advancement through interdisciplinary progression rounds. If new patient transition needs arise, please place a TOC consult.

## 2021-12-05 NOTE — Progress Notes (Signed)
Bilateral lower extremity venous duplex has been completed. Preliminary results can be found in CV Proc through chart review.  Results were given to the patient's nurse, Sarah.  12/05/21 11:57 AM Carlos Levering RVT

## 2021-12-05 NOTE — Progress Notes (Signed)
°  Echocardiogram 2D Echocardiogram has been performed.  Robert Warner 12/05/2021, 11:54 AM

## 2021-12-05 NOTE — Progress Notes (Signed)
PROGRESS NOTE                                                                                                                                                                                                             Patient Demographics:    Robert Warner, is a 81 y.o. male, DOB - May 30, 1941, XKG:[id number]  Outpatient Primary MD for the patient is [name], MD    LOS - 0  Admit date - 12/04/2021    CC -poor balance and difficulty in walking     Brief Narrative (HPI from H&P) - Robert Warner is a 81 y.o. 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  He presented to the ER with dizziness and poor balance.  He was admitted for further stroke work-up.   Subjective:    Robert Warner today has, No headache, No chest pain, No abdominal pain - No Nausea, No new weakness tingling or numbness, no SOB.   Assessment  & Plan :    Progression of embolic appearing small infarcts scattered in the bilateral cerebral and cerebellar hemispheres since November. Many such infarcts demonstrate petechial hemorrhage. Small acute infarcts are identified in the right cerebellum, left splenium and there are multiple subacute appearing punctate white matter infarcts - he has recent history of embolic infarcts in November 2022 with work-up positive for right-to-left shunt.  He was on Plavix since November 2022.  He is currently on Plavix and statin combination for secondary prevention, have requested neurology team to evaluate the patient, he already has a known right to left shunt lower extremity venous duplex is pending to look for embolic source, further recommendations per  neuro.  2.  Acute on chronic left hip pain fall at home.  Does have history of osteoarthritis but will check CT hip to rule out occult fracture  3.  Hx of metastatic cancer with unknown primary under the care of Dr. Rondel Oh.  He finished his fourth cycle of FOLFOX on 11/30/2020.  4.  Essential hypertension.  Permissive hypertension for now.  5.  Known history of left shunt.  May require closure in the light of ongoing strokes.  Consult cardiology as he is having recurrent strokes and may require right to left shunt closure sooner than later.  6.  Dehydration with hyponatremia and hypomagnesemia.  IV fluids and replace magnesium IV.  7.  AKI.  Hydrate and monitor  8 .DM type II.  Lantus and sliding scale.  Lab Results  Component Value Date   HGBA1C 6.9 (H) 09/17/2021   CBG (last 3)  Recent Labs    12/04/21 2056 12/04/21 2354 12/05/21 0947  GLUCAP 402* 237* 101*         Condition - Extremely Guarded  Family Communication  :  wife [name] [phone] on 12/05/21  Code Status :  Full  Consults  :  Neuro, structural heart team  PUD Prophylaxis :     Procedures  :     MRI - 1. No contrast administered, limiting evaluation for metastatic disease. 2. Progression of embolic appearing small infarcts scattered in the bilateral cerebral and cerebellar hemispheres since November. Many such infarcts demonstrate petechial hemorrhage (including confluent new petechial hemorrhage in the left cerebellum), but no malignant hemorrhagic transformation or intracranial mass effect. Small acute infarcts are identified in the right cerebellum, left splenium. And there are multiple subacute appearing punctate white matter infarcts. 3. Underlying advanced but nonspecific signal changes in the cerebral white matter and pons possibly due to chronic small vessel disease.  CTA - 1. CT perfusion negative for acute infarct or ischemia 2. Negative for intracranial large vessel occlusion 3. No carotid  or vertebral artery stenosis in the neck. No significant atherosclerotic disease. 4. CT head demonstrates new hypodensity right mid cerebellum. This could represent interval acute infarct or possibly metastatic disease given history. Follow-up MRI brain without and with contrast recommended. Additional chronic ischemic changes stable from prior studies. 5. 8.5 mm spiculated nodule right upper lobe, stable from prior PET-CT 10/10/2021       Disposition Plan  :    Status is: Observation    DVT Prophylaxis  :    enoxaparin (LOVENOX) injection 40 mg Start: 12/04/21 2230     Lab Results  Component Value Date   PLT 56 (L) 12/05/2021    Diet :  Diet Order             Diet Carb Modified Fluid consistency: Thin; Room service appropriate? Yes  Diet effective now                    Inpatient Medications  Scheduled Meds:  atorvastatin  40 mg Oral Daily   Chlorhexidine Gluconate Cloth  6 each Topical Daily   clopidogrel  75 mg Oral Daily   enoxaparin (LOVENOX) injection  40 mg Subcutaneous Q24H   insulin aspart  0-15 Units Subcutaneous TID WC   insulin glargine-yfgn  20 Units Subcutaneous Daily   Continuous Infusions:  sodium chloride 100 mL/hr at 12/05/21 0351   PRN Meds:.acetaminophen **OR** acetaminophen (TYLENOL) oral liquid 160 mg/5 mL **OR** acetaminophen, sodium chloride flush  Antibiotics  :    Anti-infectives (From admission, onward)    None        Time Spent in minutes  30   Lala Lund M.D on 12/05/2021 at 11:08 AM  To page go to [email or web address]   [facility] -  Office  [phone]  See all Orders from today for further details    Objective:   Vitals:   12/05/21 0300 12/05/21 0400 12/05/21 0600 12/05/21 0742  BP:  115/85 115/81 109/73 (!) 144/89  Pulse: 85 91 92 89  Resp: 16 12 12 13   Temp:  98 F (36.7 C)  98.9 F (37.2 C)  TempSrc:  Oral  Oral  SpO2: 97% 96% 97% (!) 86%  Weight:      Height:        Wt Readings from Last 3  Encounters:  12/04/21 81.7 kg  12/02/21 81.4 kg  11/30/21 79.9 kg     Intake/Output Summary (Last 24 hours) at 12/05/2021 1108 Last data filed at 12/05/2021 0351 Gross per 24 hour  Intake 1303.83 ml  Output --  Net 1303.83 ml     Physical Exam  Awake Alert, No new F.N deficits, Normal affect [place].AT,PERRAL Supple Neck, No JVD,   Symmetrical Chest wall movement, Good air movement bilaterally, CTAB RRR,No Gallops,Rubs or new Murmurs,  +ve B.Sounds, Abd Soft, No tenderness,   No Cyanosis, Clubbing or edema       Data Review:    CBC Recent Labs  Lab 11/30/21 1010 12/04/21 1309 12/04/21 1340 12/05/21 0425  WBC 9.5 4.9  --  5.1  HGB 11.8* 11.3* 12.2* 11.2*  HCT 35.2* 33.8* 36.0* 32.7*  PLT 88* 75*  --  56*  MCV 83.8 84.3  --  83.8  MCH 28.1 28.2  --  28.7  MCHC 33.5 33.4  --  34.3  RDW 15.0 14.9  --  14.9  LYMPHSABS 1.0 0.2*  --   --   MONOABS 0.8 0.0*  --   --   EOSABS 0.0 0.0  --   --   BASOSABS 0.0 0.0  --   --     Electrolytes Recent Labs  Lab 11/30/21 1010 12/04/21 1309 12/04/21 1340 12/04/21 1503 12/04/21 2257 12/05/21 0037 12/05/21 0254 12/05/21 0425 12/05/21 0645  NA 131* 130* 128*  --  133*  --   --  133*  --   K 4.0 4.5 4.8  --  4.1  --   --  3.6  --   CL 95* 93*  --   --  95*  --   --  99  --   CO2 27 22  --   --  26  --   --  25  --   GLUCOSE 412* 467*  --   --  290*  --   --  150*  --   BUN 21 42*  --   --  34*  --   --  31*  --   CREATININE 1.21 2.07*  --   --  1.54*  --   --  1.42*  --   CALCIUM 9.1 8.8*  --   --  8.7*  --   --  8.2*  --   AST 24 31  --   --   --   --   --   --   --   ALT 21 21  --   --   --   --   --   --   --   ALKPHOS 77 73  --   --   --   --   --   --   --   BILITOT 1.2 0.8  --   --   --   --   --   --   --   ALBUMIN 3.4* 3.2*  --   --   --   --   --   --   --   MG  --   --   --   --   --   --   --   --  1.6*  CRP  --   --   --  11.9*  --   --   --   --  16.3*  PROCALCITON  --   --   --   --   --   --   --   --   40.42  LATICACIDVEN  --   --   --   --   --  4.2* 1.8  --   --   INR  --  1.2  --   --   --   --   --   --   --   TSH  --   --   --  0.465  --   --   --   --   --   BNP  --   --   --   --   --   --   --   --  181.6*    ------------------------------------------------------------------------------------------------------------------ Recent Labs    12/05/21 0425  CHOL 118  HDL 35*  LDLCALC 59  TRIG 120  CHOLHDL 3.4    Lab Results  Component Value Date   HGBA1C 6.9 (H) 09/17/2021    Recent Labs    12/04/21 1503  TSH 0.465   ------------------------------------------------------------------------------------------------------------------ ID Labs Recent Labs  Lab 11/30/21 1010 12/04/21 1309 12/04/21 1503 12/04/21 2257 12/05/21 0037 12/05/21 0254 12/05/21 0425 12/05/21 0645  WBC 9.5 4.9  --   --   --   --  5.1  --   PLT 88* 75*  --   --   --   --  56*  --   CRP  --   --  11.9*  --   --   --   --  16.3*  PROCALCITON  --   --   --   --   --   --   --  40.42  LATICACIDVEN  --   --   --   --  4.2* 1.8  --   --   CREATININE 1.21 2.07*  --  1.54*  --   --  1.42*  --    Cardiac Enzymes No results for input(s): CKMB, TROPONINI, MYOGLOBIN in the last 168 hours.  Invalid input(s): CK  Radiology Reports MR BRAIN WO CONTRAST  Result Date: 12/05/2021 CLINICAL DATA:  81 year old male acute neurologic deficit. Left side weakness. Metastatic disease unknown primary. Widely scattered small embolic appearing cerebral and cerebellar infarcts in November. New cerebellar hypodensity on CT yesterday. EXAM: MRI HEAD WITHOUT CONTRAST TECHNIQUE: Multiplanar, multiecho pulse sequences of the brain and surrounding structures were obtained without intravenous contrast. COMPARISON:  CTA head and neck yesterday.  Brain MRI 09/16/2021. FINDINGS: Brain: Mostly expected evolution of the scattered small bilateral cerebral and cerebellar infarcts seen in November, including in the right thalamus. But  petechial hemorrhagic conversion of multiple bilateral cerebellar infarcts since that time, including confluent petechial hemorrhage in the left hemisphere on series 7, image 23 with faint associated cerebellar edema. And multiple such small hemorrhagic infarcts are new since November but nonacute. No contrast administered. On DWI there is a small contralateral central right cerebellar area of diffusion restriction now (series 2, image 11). a and a small focus of white matter restricted diffusion in the left splenium (series 2, image 28) with T2 and FLAIR hyperintensity. Also questionable punctate restricted diffusion in the left occipital pole white matter, although may be susceptibility artifact from chronic microhemorrhage there also. Furthermore, there are several small bilateral frontal and  parietal lobe central white matter foci of abnormal diffusion, although most appear facilitated on ADC (series 2, image 33). Fewer new but chronic microhemorrhages scattered in the supratentorial brain. Underlying confluent bilateral cerebral white matter T2 and FLAIR hyperintensity which is advanced but nonspecific. Stable similar T2 and FLAIR hyperintensity in the pons. Small areas of developing cortical encephalomalacia corresponding to interval small cortical infarcts such as medial superior right parietal lobe series 6, image 29. No midline shift, obvious mass lesion, ventriculomegaly, extra-axial collection or acute intracranial hemorrhage. Cervicomedullary junction and pituitary are within normal limits. Vascular: Major intracranial vascular flow voids are stable. Skull and upper cervical spine: Heterogeneous bone marrow signal is indeterminate in the visible cervical spine, not definitely progressed since November. No destructive osseous lesion is identified. Sinuses/Orbits: Stable, negative. Other: Mastoids are clear. Visible internal auditory structures appear normal. IMPRESSION: 1. No contrast administered,  limiting evaluation for metastatic disease. 2. Progression of embolic appearing small infarcts scattered in the bilateral cerebral and cerebellar hemispheres since November. Many such infarcts demonstrate petechial hemorrhage (including confluent new petechial hemorrhage in the left cerebellum), but no malignant hemorrhagic transformation or intracranial mass effect. Small acute infarcts are identified in the right cerebellum, left splenium. And there are multiple subacute appearing punctate white matter infarcts. 3. Underlying advanced but nonspecific signal changes in the cerebral white matter and pons possibly due to chronic small vessel disease. Electronically Signed   By: Genevie Ann M.D.   On: 12/05/2021 09:28   DG Chest Port 1 View  Result Date: 12/05/2021 CLINICAL DATA:  81 year old male with shortness of breath. Metastatic disease unknown primary. EXAM: PORTABLE CHEST 1 VIEW COMPARISON:  Chest radiographs 09/20/2021 and earlier. FINDINGS: Portable AP semi upright view at 0610 hours. Right chest power port is new since last year. Lower lung volumes. Mediastinal contours remain within normal limits. Visualized tracheal air column is within normal limits. Diffuse crowding of lung markings with no pneumothorax, pleural effusion, consolidation, or overt edema. No lung nodule identified. Visible osseous structures within normal limits. Paucity of bowel gas. IMPRESSION: Low lung volumes, otherwise no acute cardiopulmonary abnormality. Electronically Signed   By: Genevie Ann M.D.   On: 12/05/2021 06:42   CT ANGIO HEAD NECK W WO CM W PERF (CODE STROKE)  Result Date: 12/04/2021 CLINICAL DATA:  Acute neuro deficit. Left-sided weakness. History of metastatic disease. EXAM: CT ANGIOGRAPHY HEAD AND NECK CT PERFUSION BRAIN TECHNIQUE: Multidetector CT imaging of the head and neck was performed using the standard protocol during bolus administration of intravenous contrast. Multiplanar CT image reconstructions and MIPs were  obtained to evaluate the vascular anatomy. Carotid stenosis measurements (when applicable) are obtained utilizing NASCET criteria, using the distal internal carotid diameter as the denominator. Multiphase CT imaging of the brain was performed following IV bolus contrast injection. Subsequent parametric perfusion maps were calculated using RAPID software. RADIATION DOSE REDUCTION: This exam was performed according to the departmental dose-optimization program which includes automated exposure control, adjustment of the mA and/or kV according to patient size and/or use of iterative reconstruction technique. CONTRAST:  119mL OMNIPAQUE IOHEXOL 350 MG/ML SOLN COMPARISON:  CT head 11/18/2021 FINDINGS: CT HEAD FINDINGS Brain: Generalized atrophy. Chronic microvascular ischemic change throughout the white matter. Chronic infarct right thalamus. Interval contraction of hypodensity in the right cerebellum compatible with resolving infarct. Chronic infarct left superior cerebellum. Hypodensity in the central right cerebellum has progressed from prior studies and may be recent extension of a previously noted infarct. This measures 1 cm. Chronic infarct right  superior cerebellum unchanged Negative for hemorrhage or mass. Vascular: Negative for hyperdense vessel Skull: Negative Sinuses: Paranasal sinuses clear Orbits: Bilateral cataract extraction. ASPECTS ([place] Stroke Program Early CT Score) - Ganglionic level infarction (caudate, lentiform nuclei, internal capsule, insula, M1-M3 cortex): 7 - Supraganglionic infarction (M4-M6 cortex): 3 Total score (0-10 with 10 being normal): 10 Review of the MIP images confirms the above findings CTA NECK FINDINGS Aortic arch: Normal aortic arch. Proximal great vessels widely patent. Bovine branching arch. Right carotid system: Right carotid widely patent without stenosis or atherosclerotic disease. Left carotid system: Left carotid widely patent. Negative for atherosclerotic disease or  stenosis Vertebral arteries: Both vertebral arteries widely patent to the skull base. No significant stenosis. Skeleton: Degenerative changes in the cervical spine. No acute skeletal abnormality. Other neck: Negative for mass or edema in the neck. Port-A-Cath tip right jugular vein. Upper chest: 8.5 mm spiculated nodule right upper lobe unchanged from PET-CT 10/10/2021. No other lung nodule in the apices. Review of the MIP images confirms the above findings CTA HEAD FINDINGS Anterior circulation: Cavernous carotid widely patent bilaterally. Anterior and middle cerebral arteries widely patent bilaterally. No stenosis or vascular malformation. Posterior circulation: Both vertebral arteries patent to the basilar. Basilar widely patent. PICA patent bilaterally. AICA, superior cerebellar, posterior cerebral arteries patent without stenosis or large vessel occlusion. Negative for cerebrovascular malformation. Venous sinuses: Normal venous enhancement Anatomic variants: None Review of the MIP images confirms the above findings CT Brain Perfusion Findings: ASPECTS: 10 CBF (<30%) Volume: 16mL Perfusion (Tmax>6.0s) volume: 69mL Mismatch Volume: 88mL Infarction Location:None IMPRESSION: 1. CT perfusion negative for acute infarct or ischemia 2. Negative for intracranial large vessel occlusion 3. No carotid or vertebral artery stenosis in the neck. No significant atherosclerotic disease. 4. CT head demonstrates new hypodensity right mid cerebellum. This could represent interval acute infarct or possibly metastatic disease given history. Follow-up MRI brain without and with contrast recommended. Additional chronic ischemic changes stable from prior studies. 5. 8.5 mm spiculated nodule right upper lobe, stable from prior PET-CT 10/10/2021 Electronically Signed   By: Franchot Gallo M.D.   On: 12/04/2021 14:08

## 2021-12-06 ENCOUNTER — Encounter: Payer: Self-pay | Admitting: Family Medicine

## 2021-12-06 ENCOUNTER — Encounter: Payer: Self-pay | Admitting: Gastroenterology

## 2021-12-06 DIAGNOSIS — C23 Malignant neoplasm of gallbladder: Secondary | ICD-10-CM | POA: Diagnosis present

## 2021-12-06 DIAGNOSIS — E860 Dehydration: Secondary | ICD-10-CM | POA: Diagnosis present

## 2021-12-06 DIAGNOSIS — I82452 Acute embolism and thrombosis of left peroneal vein: Secondary | ICD-10-CM | POA: Diagnosis present

## 2021-12-06 DIAGNOSIS — N182 Chronic kidney disease, stage 2 (mild): Secondary | ICD-10-CM | POA: Diagnosis present

## 2021-12-06 DIAGNOSIS — E870 Hyperosmolality and hypernatremia: Secondary | ICD-10-CM | POA: Diagnosis present

## 2021-12-06 DIAGNOSIS — G9349 Other encephalopathy: Secondary | ICD-10-CM | POA: Diagnosis present

## 2021-12-06 DIAGNOSIS — I639 Cerebral infarction, unspecified: Secondary | ICD-10-CM | POA: Diagnosis present

## 2021-12-06 DIAGNOSIS — I6340 Cerebral infarction due to embolism of unspecified cerebral artery: Secondary | ICD-10-CM | POA: Diagnosis not present

## 2021-12-06 DIAGNOSIS — Z20822 Contact with and (suspected) exposure to covid-19: Secondary | ICD-10-CM | POA: Diagnosis present

## 2021-12-06 DIAGNOSIS — C787 Secondary malignant neoplasm of liver and intrahepatic bile duct: Secondary | ICD-10-CM | POA: Diagnosis present

## 2021-12-06 DIAGNOSIS — F32A Depression, unspecified: Secondary | ICD-10-CM | POA: Diagnosis present

## 2021-12-06 DIAGNOSIS — N179 Acute kidney failure, unspecified: Secondary | ICD-10-CM | POA: Diagnosis present

## 2021-12-06 DIAGNOSIS — E785 Hyperlipidemia, unspecified: Secondary | ICD-10-CM | POA: Diagnosis present

## 2021-12-06 DIAGNOSIS — B961 Klebsiella pneumoniae [K. pneumoniae] as the cause of diseases classified elsewhere: Secondary | ICD-10-CM | POA: Diagnosis present

## 2021-12-06 DIAGNOSIS — Q2112 Patent foramen ovale: Secondary | ICD-10-CM | POA: Diagnosis not present

## 2021-12-06 DIAGNOSIS — R29704 NIHSS score 4: Secondary | ICD-10-CM | POA: Diagnosis present

## 2021-12-06 DIAGNOSIS — E871 Hypo-osmolality and hyponatremia: Secondary | ICD-10-CM | POA: Diagnosis present

## 2021-12-06 DIAGNOSIS — D6959 Other secondary thrombocytopenia: Secondary | ICD-10-CM | POA: Diagnosis present

## 2021-12-06 DIAGNOSIS — E1165 Type 2 diabetes mellitus with hyperglycemia: Secondary | ICD-10-CM | POA: Diagnosis present

## 2021-12-06 DIAGNOSIS — E1122 Type 2 diabetes mellitus with diabetic chronic kidney disease: Secondary | ICD-10-CM | POA: Diagnosis present

## 2021-12-06 DIAGNOSIS — R7881 Bacteremia: Secondary | ICD-10-CM | POA: Diagnosis present

## 2021-12-06 DIAGNOSIS — I63413 Cerebral infarction due to embolism of bilateral middle cerebral arteries: Secondary | ICD-10-CM | POA: Diagnosis present

## 2021-12-06 DIAGNOSIS — D6869 Other thrombophilia: Secondary | ICD-10-CM | POA: Diagnosis present

## 2021-12-06 DIAGNOSIS — C801 Malignant (primary) neoplasm, unspecified: Secondary | ICD-10-CM | POA: Diagnosis not present

## 2021-12-06 DIAGNOSIS — G8194 Hemiplegia, unspecified affecting left nondominant side: Secondary | ICD-10-CM | POA: Diagnosis present

## 2021-12-06 DIAGNOSIS — D849 Immunodeficiency, unspecified: Secondary | ICD-10-CM | POA: Diagnosis present

## 2021-12-06 DIAGNOSIS — Z8673 Personal history of transient ischemic attack (TIA), and cerebral infarction without residual deficits: Secondary | ICD-10-CM | POA: Diagnosis not present

## 2021-12-06 LAB — URINALYSIS, ROUTINE W REFLEX MICROSCOPIC
Bacteria, UA: NONE SEEN
Bilirubin Urine: NEGATIVE
Glucose, UA: 150 mg/dL — AB
Ketones, ur: NEGATIVE mg/dL
Nitrite: NEGATIVE
Protein, ur: 30 mg/dL — AB
RBC / HPF: >50 RBC/hpf — ABNORMAL HIGH (ref 0–5)
Specific Gravity, Urine: 1.026 (ref 1.005–1.030)
pH: 7 (ref 5.0–8.0)

## 2021-12-06 LAB — COMPREHENSIVE METABOLIC PANEL
ALT: 24 U/L (ref 0–44)
AST: 59 U/L — ABNORMAL HIGH (ref 15–41)
Albumin: 2.1 g/dL — ABNORMAL LOW (ref 3.5–5.0)
Alkaline Phosphatase: 70 U/L (ref 38–126)
Anion gap: 5 (ref 5–15)
BUN: 19 mg/dL (ref 8–23)
CO2: 24 mmol/L (ref 22–32)
Calcium: 7.8 mg/dL — ABNORMAL LOW (ref 8.9–10.3)
Chloride: 101 mmol/L (ref 98–111)
Creatinine, Ser: 1.07 mg/dL (ref 0.61–1.24)
GFR, Estimated: >60 mL/min (ref >60)
Glucose, Bld: 93 mg/dL (ref 70–99)
Potassium: 3.5 mmol/L (ref 3.5–5.1)
Sodium: 130 mmol/L — ABNORMAL LOW (ref 135–145)
Total Bilirubin: 0.7 mg/dL (ref 0.3–1.2)
Total Protein: 4.8 g/dL — ABNORMAL LOW (ref 6.5–8.1)

## 2021-12-06 LAB — CBC WITH DIFFERENTIAL/PLATELET
Abs Immature Granulocytes: 0.05 10*3/uL (ref 0.00–0.07)
Basophils Absolute: 0 10*3/uL (ref 0.0–0.1)
Basophils Relative: 1 %
Eosinophils Absolute: 0.1 10*3/uL (ref 0.0–0.5)
Eosinophils Relative: 1 %
HCT: 31.4 % — ABNORMAL LOW (ref 39.0–52.0)
Hemoglobin: 10.9 g/dL — ABNORMAL LOW (ref 13.0–17.0)
Immature Granulocytes: 1 %
Lymphocytes Relative: 15 %
Lymphs Abs: 1.3 10*3/uL (ref 0.7–4.0)
MCH: 29.1 pg (ref 26.0–34.0)
MCHC: 34.7 g/dL (ref 30.0–36.0)
MCV: 84 fL (ref 80.0–100.0)
Monocytes Absolute: 0.3 10*3/uL (ref 0.1–1.0)
Monocytes Relative: 3 %
Neutro Abs: 6.9 10*3/uL (ref 1.7–7.7)
Neutrophils Relative %: 79 %
Platelets: 56 10*3/uL — ABNORMAL LOW (ref 150–400)
RBC: 3.74 MIL/uL — ABNORMAL LOW (ref 4.22–5.81)
RDW: 14.9 % (ref 11.5–15.5)
WBC: 8.6 10*3/uL (ref 4.0–10.5)
nRBC: 0 % (ref 0.0–0.2)

## 2021-12-06 LAB — OSMOLALITY: Osmolality: 279 mOsm/kg (ref 275–295)

## 2021-12-06 LAB — GLUCOSE, CAPILLARY
Glucose-Capillary: 62 mg/dL — ABNORMAL LOW (ref 70–99)
Glucose-Capillary: 89 mg/dL (ref 70–99)
Glucose-Capillary: 187 mg/dL — ABNORMAL HIGH (ref 70–99)
Glucose-Capillary: 223 mg/dL — ABNORMAL HIGH (ref 70–99)
Glucose-Capillary: 232 mg/dL — ABNORMAL HIGH (ref 70–99)

## 2021-12-06 LAB — OSMOLALITY, URINE: Osmolality, Ur: 660 mOsm/kg (ref 300–900)

## 2021-12-06 LAB — C-REACTIVE PROTEIN: CRP: 14.8 mg/dL — ABNORMAL HIGH (ref <1.0)

## 2021-12-06 LAB — PROCALCITONIN: Procalcitonin: 26.49 ng/mL

## 2021-12-06 LAB — HEPARIN LEVEL (UNFRACTIONATED)
Heparin Unfractionated: 0.44 IU/mL (ref 0.30–0.70)
Heparin Unfractionated: 0.6 IU/mL (ref 0.30–0.70)
Heparin Unfractionated: 0.5 IU/mL (ref 0.30–0.70)

## 2021-12-06 LAB — BRAIN NATRIURETIC PEPTIDE: B Natriuretic Peptide: 111.6 pg/mL — ABNORMAL HIGH (ref 0.0–100.0)

## 2021-12-06 LAB — URIC ACID: Uric Acid, Serum: 4.4 mg/dL (ref 3.7–8.6)

## 2021-12-06 LAB — SODIUM, URINE, RANDOM: Sodium, Ur: 82 mmol/L

## 2021-12-06 LAB — MAGNESIUM: Magnesium: 1.9 mg/dL (ref 1.7–2.4)

## 2021-12-06 MED ORDER — INSULIN GLARGINE-YFGN 100 UNIT/ML ~~LOC~~ SOLN
15.0000 [IU] | Freq: Every day | SUBCUTANEOUS | Status: DC
  Administered 2021-12-06 – 2021-12-07 (×2): 15 [IU] via SUBCUTANEOUS
  Filled 2021-12-06 (×3): qty 0.15

## 2021-12-06 MED ORDER — FUROSEMIDE 20 MG PO TABS
20.0000 mg | ORAL_TABLET | Freq: Once | ORAL | Status: AC
Start: 2021-12-06 — End: 2021-12-06
  Administered 2021-12-06: 14:00:00 20 mg via ORAL
  Filled 2021-12-06: qty 1

## 2021-12-06 NOTE — Progress Notes (Signed)
STROKE TEAM PROGRESS NOTE   INTERVAL HISTORY Wife is at bedside.  Patient sitting in chair, awake alert, but in depressed mood.  Neuro stable, no acute event overnight, on heparin IV.  Tolerating well so far, plan for transition to Eliquis tomorrow.  PT/OT recommend home health.  Vitals:   12/05/21 1614 12/05/21 1924 12/06/21 0753 12/06/21 1131  BP: (!) 141/93 114/84 130/87 131/87  Pulse:  91 81 80  Resp: 17 (!) 21 15 16   Temp: 98.4 F (36.9 C) 98.6 F (37 C) 98.4 F (36.9 C) 98.4 F (36.9 C)  TempSrc: Oral Oral Oral Oral  SpO2:  94% 98% 98%  Weight:      Height:       CBC:  Recent Labs  Lab 12/04/21 1309 12/04/21 1340 12/05/21 0425 12/06/21 0404  WBC 4.9  --  5.1 8.6  NEUTROABS 4.7  --   --  6.9  HGB 11.3*   < > 11.2* 10.9*  HCT 33.8*   < > 32.7* 31.4*  MCV 84.3  --  83.8 84.0  PLT 75*  --  56* 56*   < > = values in this interval not displayed.   Basic Metabolic Panel:  Recent Labs  Lab 12/05/21 0425 12/05/21 0645 12/06/21 0404  NA 133*  --  130*  K 3.6  --  3.5  CL 99  --  101  CO2 25  --  24  GLUCOSE 150*  --  93  BUN 31*  --  19  CREATININE 1.42*  --  1.07  CALCIUM 8.2*  --  7.8*  MG  --  1.6* 1.9   Lipid Panel:  Recent Labs  Lab 12/05/21 0425  CHOL 118  TRIG 120  HDL 35*  CHOLHDL 3.4  VLDL 24  LDLCALC 59   HgbA1c:  Recent Labs  Lab 12/04/21 2257  HGBA1C 11.9*   Urine Drug Screen:  Recent Labs  Lab 12/04/21 1500  LABOPIA NONE DETECTED  COCAINSCRNUR NONE DETECTED  LABBENZ NONE DETECTED  AMPHETMU NONE DETECTED  THCU NONE DETECTED  LABBARB NONE DETECTED    Alcohol Level  Recent Labs  Lab 12/04/21 1309  ETH <10    IMAGING past 24 hours CT ABDOMEN PELVIS W CONTRAST  Result Date: 12/05/2021 CLINICAL DATA:  Abdominal pain, acute, nonlocalized. Metastatic cancer of unknown primary. EXAM: CT ABDOMEN AND PELVIS WITH CONTRAST TECHNIQUE: Multidetector CT imaging of the abdomen and pelvis was performed using the standard protocol  following bolus administration of intravenous contrast. RADIATION DOSE REDUCTION: This exam was performed according to the departmental dose-optimization program which includes automated exposure control, adjustment of the mA and/or kV according to patient size and/or use of iterative reconstruction technique. CONTRAST:  125mL OMNIPAQUE IOHEXOL 300 MG/ML  SOLN COMPARISON:  PET CT 10/10/2021 FINDINGS: Lower chest: No acute abnormality. Hepatobiliary: Multiple heterogeneously enhancing masses are again seen throughout both hepatic lobes with the dominant conglomerate within segment 5 of the liver similar to that noted on prior PET CT examination. The appearance is relatively stable when compared to prior MRI examination of 09/03/2021. Mild bilobar intrahepatic biliary ductal dilation appears stable since prior MRI examination. Main, right, and left portal veins are patent. The gallbladder appears intimately associated with the malignant masses within segment 5 of the liver and a primary gallbladder carcinoma could be considered. This, too, appears unchanged from prior examination. Pancreas: Unremarkable Spleen: Unremarkable Adrenals/Urinary Tract: The adrenal glands are unremarkable. The kidneys are normal in size and position. 7 cm  simple cortical cyst noted within the upper pole of the right kidney. Hyperdensity within the upper pole calices of the right kidney and within the left renal pelvis likely represents excreted contrast. No hydronephrosis. No definite nephro or urolithiasis. The bladder is unremarkable. Stomach/Bowel: The stomach is unremarkable. The second portion the duodenum is in close proximity to the porta hepatis and cystic duct and there is infiltration of the periduodenal soft tissue which may relate to edema or infiltration related to the patient's primary malignancy. This is best appreciated on coronal image # 37/6 focal narrowing of the duodenum in this region may cicatricial change. The small  and large bowel are unremarkable. The appendix is not clearly identified and may be absent. No free intraperitoneal gas or fluid. Reflect Vascular/Lymphatic: Aortic atherosclerosis. No enlarged abdominal or pelvic lymph nodes. Reproductive: The prostate gland is enlarged. Metallic densities within the prostate gland may relate to UroLift procedure. Other: Tiny fat containing umbilical hernia. Musculoskeletal: No acute bone abnormality. No lytic or blastic bone lesion. IMPRESSION: Stable examination. Bilobar hepatic masses in keeping with metastatic disease with dominant conglomerate within segment 5 of the liver in close proximity to the adjacent gallbladder which appears contracted and demonstrates irregular wall thickening. 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 Infiltrative change within the porta hepatis noted with abutment of the second portion of the duodenum without evidence of obstruction or fistula formation at this time. Stable mild intrahepatic biliary ductal dilation. No acute intra-abdominal process identified. Aortic Atherosclerosis (ICD10-I70.0). Electronically Signed   By: Fidela Salisbury M.D.   On: 12/05/2021 21:28    PHYSICAL EXAM  Temp:  [98.4 F (36.9 C)-98.6 F (37 C)] 98.4 F (36.9 C) (01/24 1131) Pulse Rate:  [80-91] 80 (01/24 1131) Resp:  [15-21] 16 (01/24 1131) BP: (114-141)/(84-93) 131/87 (01/24 1131) SpO2:  [94 %-98 %] 98 % (01/24 1131)  General - Well nourished, well developed, in no apparent distress, port at right chest.  Ophthalmologic - fundi not visualized due to noncooperation.  Cardiovascular - Regular rhythm and rate.  Neuro - awake, alert, eyes open, orientated to age, place, time and people. No aphasia, paucity of speech,, following all simple commands. Able to name and repeat and read. No gaze palsy, no nystagmus, tracking bilaterally, visual field full. No facial droop. Tongue midline. Bilateral UEs 5/5, no drift.  Bilaterally LEs 5/5, no drift. Sensation symmetrical bilaterally, right finger-to-nose and left heel-to-shin mild dysmetria, gait not tested.     ASSESSMENT/PLAN [name] is a 81 y.o. male with history of T2DM, HTN, hx of embolic stroke, HLD, CKD stage 3, unknown primary cancer presenting with feeling off balance from baseline. LKW around 9:30pm. Concern for new stroke. Progression of embolic appearing small infarcts scattered in the bilateral cerebral and cerebellar hemispheres since November. Many such infarcts demonstrate petechial hemorrhage. Small acute infarcts are identified in the right cerebellum, left splenium and there are multiple subacute appearing punctate white matter infarcts. He has recent history of embolic infarcts in November 2022 with work-up positive for right-to-left shunt.  He was on Plavix since November 2022. Unable able to start DAPT due to pending liver biopsy. Follow up CT suspicious bilateral hypodensity, MRI pending. Consider switching DAPT to Eliquis due to platelet levels. Lower extremity venous duplex positive for DVT in left peroneal veins.  Stroke: Embolic shower involving bilateral MCA, left splenium, left occipital and bilateral cerebellum with mild hemorrhagic transformation, likely due to hypercarbia state secondary to advanced malignancy vs. DVT  in the setting of PFO CT 1/6 New "lesion" in the left cerebellum when compared to brain MRI 09/16/2021.  CT 1/22 CT head demonstrates new hypodensity right mid cerebellum.  CTA head & neck unremarkable CT perfusion- negative MRI  Progression of embolic appearing infarcts scattered in the bilateral cerebral and cerebellar hemispheres since November. Many such infarcts demonstrate petechial hemorrhage including confluent new petechial hemorrhage in the left cerebellum. Small acute infarcts are identified in the right cerebellum, left splenium. And there are multiple subacute appearing punctate white matter  infarcts. 2D Echo EF 50-55%, no regional wall abnormalities LE venous Doppler findings consistent with age indeterminate deep vein thrombosis involving the left peroneal veins.  LDL 59 HgbA1c 6.9 VTE prophylaxis - IV Heparin clopidogrel 75 mg daily prior to admission, now on heparin IV given hypercoagulable state from advanced malignancy and DVT with PFO.  Plan to switch to Eliquis 5 mg twice daily tomorrow evening if tolerating IV heparin well. Therapy recommendations:  Outpatient PT/OT Disposition:  pending  History of stroke 09/2021 patient admitted for imbalance, difficult gait.  MRI showed embolic shower.  TCD bubble study showed positive PFO.  CTA head and neck negative except right lung nodule.  EF 60 to 60%.  DVT negative.  LDL 97, A1c 6.9.  He was put on DAPT after liver biopsy and pravastatin 40. ROPE score = 2.  Hypertension Home meds:  None Stable Long-term BP goal normotensive  Hyperlipidemia Home meds:  Atorvastatin 40mg , resumed in hospital LDL 59, goal < 70 Continue statin at discharge  Diabetes type II Uncontrolled Home meds:  Insulin HgbA1c 6.9, goal < 7.0 CBGs SSI  PFO LLE DVT 2D echo 09/17/2021 showed positive PFO PFO confirmed on TCD bubble study in 09/2021 This admission LE venous Doppler showed left lower extremity DVT Plan to switch to Eliquis tomorrow evening if tolerating heparin IV well  Other Stroke Risk Factors Advanced Age >/= 63   Other Active Problems AKI  Hyponatremia and hypomagnesmia Metastatic liver cancer  Dr. Rondel Oh Liver biopsy in 09/2021 showed possible primary from pancrebiliary source. Finished 4th cycle of FOLFOX on 11/30/2021 Acute on chronic hip pain  Hospital day # 0  Neurology will sign off. Please call with questions. Pt will follow up with Dr. Krista Blue on 12/30/2021 at [facility].  Thanks for the consult.   Rosalin Hawking, MD PhD Stroke Neurology 12/06/2021 1:29 PM   To contact Stroke Continuity provider, please refer to  [email or web address] After hours, contact General Neurology

## 2021-12-06 NOTE — Consult Note (Signed)
[facility]  Total days of antibiotics 2/ceftriaxone               Reason for Consult: klebsiella bacteremia    Referring Physician: singh  Principal Problem:   Acute embolic stroke [facility]) Active Problems:   Type 2 diabetes mellitus with hyperglycemia ([place])   Essential hypertension   Recent cerebrovascular accident (CVA)   Interatrial cardiac shunt   Cancer with unknown primary site [facility])   AKI (acute kidney injury) ([place])   Hyponatremia   CVA (cerebral vascular accident) ([place])    HPI: Robert Warner is a 81 y.o. 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 Also has hx of multifocal embolic stroke in Nov 0240 with PFO of Right to Left shunt- now on plavix He has tolerated chemo, not having any febrile neutropenia. Admitted due to focal deficit that shows new left and right cerebellum lesions- many infarcts showing petechial hemorrhage. Stroke work up found DVT in left peroneal vein of unknown age. Stroke service planning to switch to eliquis 5mg  BID. On admit also had blood cx which showed kleb pneumoniae bacteremia. He denies GI symptoms, nor any urology symptoms. No pain or erythema at his portacath site   Past Medical History:  Diagnosis Date   Arthritis    Cancer ([place])    Cataract    Diabetes mellitus    Hyperlipidemia    Stroke ([place])     Allergies: No Known Allergies   MEDICATIONS:  atorvastatin  40 mg Oral Daily   Chlorhexidine Gluconate Cloth  6 each Topical Daily   insulin aspart  0-15 Units Subcutaneous TID WC   insulin glargine-yfgn  15 Units Subcutaneous Daily    Social History   Tobacco Use   Smoking status: Former    Packs/day: 0.50    Years: 10.00    Pack years: 5.00    Types: Cigarettes    Quit date: 11/13/1966    Years since quitting: 55.1   Smokeless tobacco: Never   Vaping Use   Vaping Use: Never used  Substance Use Topics   Alcohol use: Not Currently    Alcohol/week: 2.0 standard drinks    Types: 2 Glasses of wine per week    Comment: Occasional wine or beer   Drug use: No    Family History  Problem Relation Age of Onset   Hypertension Mother    Coronary artery disease Mother    Stroke Father 59   Stroke Sister    Pancreatic cancer Brother    Diabetes Maternal Aunt    Coronary artery disease Maternal Uncle        2 Maternal Uncles    Diabetes Maternal Uncle    Breast cancer Paternal Aunt    Coronary artery disease Paternal Aunt    Coronary artery disease Maternal Grandmother    Stroke Paternal Grandmother    Heart disease Neg Hx    Colon cancer Neg Hx    Esophageal cancer Neg Hx    Rectal cancer Neg Hx    Stomach cancer Neg Hx     Review of Systems - weakness and fatigue. 12 point ros is otherwise negative   OBJECTIVE: Temp:  [98.4 F (36.9 C)-98.6 F (37 C)] 98.4 F (36.9 C) (01/24 1131) Pulse Rate:  [79-91] 79 (01/24 1500) Resp:  [12-21] 12 (01/24 1500)  BP: (114-139)/(84-87) 139/85 (01/24 1500) SpO2:  [94 %-99 %] 99 % (01/24 1500) Physical Exam  Constitutional: He is oriented to person, place, and time. He appears well-developed and well-nourished. No distress.  HENT:  Mouth/Throat: Oropharynx is clear and moist. No oropharyngeal exudate.  Cardiovascular: Normal rate, regular rhythm and normal heart sounds. Exam reveals no gallop and no friction rub.  No murmur heard.  Chest wall: left port a cath is intact non tender Pulmonary/Chest: Effort normal and breath sounds normal. No respiratory distress. He has no wheezes.  Abdominal: Soft. Bowel sounds are normal. He exhibits no distension. There is no tenderness.  Lymphadenopathy:  He has no cervical adenopathy.  Neurological: He is alert and oriented to person, place, and time.  Skin: Skin is warm and dry. No rash noted. No erythema.  Psychiatric: He has a normal mood  and affect. His behavior is normal.    LABS: Results for orders placed or performed during the hospital encounter of 12/04/21 (from the past 48 hour(s))  Glucose, capillary     Status: Abnormal   Collection Time: 12/04/21  8:56 PM  Result Value Ref Range   Glucose-Capillary 402 (H) 70 - 99 mg/dL    Comment: Glucose reference range applies only to samples taken after fasting for at least 8 hours.  Blood gas, venous     Status: Abnormal   Collection Time: 12/04/21 10:57 PM  Result Value Ref Range   FIO2 21.00    pH, Ven 7.410 7.250 - 7.430   pCO2, Ven 42.9 (L) 44.0 - 60.0 mmHg   pO2, Ven 44.4 32.0 - 45.0 mmHg   Bicarbonate 26.6 20.0 - 28.0 mmol/L   Acid-Base Excess 2.4 (H) 0.0 - 2.0 mmol/L   O2 Saturation 79.1 %   Patient temperature 37.0    Drawn by [id number]    Sample type VENOUS     Comment: Performed at [facility] [address] [place], [place] [address]  Hemoglobin A1c     Status: Abnormal   Collection Time: 12/04/21 10:57 PM  Result Value Ref Range   Hgb A1c MFr Bld 11.9 (H) 4.8 - 5.6 %    Comment: (NOTE)         Prediabetes: 5.7 - 6.4         Diabetes: >6.4         Glycemic control for adults with diabetes: <7.0    Mean Plasma Glucose 295 mg/dL    Comment: (NOTE) Performed At: [facility]  [place], [place] [id number] Rush Farmer MD EV:[id number]   Basic metabolic panel     Status: Abnormal   Collection Time: 12/04/21 10:57 PM  Result Value Ref Range   Sodium 133 (L) 135 - 145 mmol/L   Potassium 4.1 3.5 - 5.1 mmol/L   Chloride 95 (L) 98 - 111 mmol/L   CO2 26 22 - 32 mmol/L   Glucose, Bld 290 (H) 70 - 99 mg/dL    Comment: Glucose reference range applies only to samples taken after fasting for at least 8 hours.   BUN 34 (H) 8 - 23 mg/dL   Creatinine, Ser 1.54 (H) 0.61 - 1.24 mg/dL   Calcium 8.7 (L) 8.9 - 10.3 mg/dL   GFR, Estimated 45 (L) >60 mL/min    Comment: (NOTE) Calculated using the CKD-EPI Creatinine Equation (2021)     Anion gap 12 5 - 15    Comment: Performed at [facility] [address], [place], [place]  [address]  Glucose, capillary     Status: Abnormal   Collection Time: 12/04/21 11:54 PM  Result Value Ref Range   Glucose-Capillary 237 (H) 70 - 99 mg/dL    Comment: Glucose reference range applies only to samples taken after fasting for at least 8 hours.  Lactic acid, plasma     Status: Abnormal   Collection Time: 12/05/21 12:37 AM  Result Value Ref Range   Lactic Acid, Venous 4.2 (HH) 0.5 - 1.9 mmol/L    Comment: CRITICAL RESULT CALLED TO, READ BACK BY AND VERIFIED WITH: Haskel Khan RN 12/05/21 0147 Wiliam Ke Performed at [facility], [address], [place], [place] [address]   Culture, blood (routine x 2)     Status: Abnormal (Preliminary result)   Collection Time: 12/05/21 12:38 AM   Specimen: BLOOD LEFT ARM  Result Value Ref Range   Specimen Description BLOOD LEFT ARM    Special Requests      BOTTLES DRAWN AEROBIC AND ANAEROBIC Blood Culture results may not be optimal due to an excessive volume of blood received in culture bottles   Culture  Setup Time      GRAM NEGATIVE RODS IN BOTH AEROBIC AND ANAEROBIC BOTTLES Performed at [facility], [place] [address] [place], [place] [address]    Culture KLEBSIELLA PNEUMONIAE (A)    Report Status PENDING   Culture, blood (routine x 2)     Status: Abnormal (Preliminary result)   Collection Time: 12/05/21 12:46 AM   Specimen: BLOOD RIGHT ARM  Result Value Ref Range   Specimen Description BLOOD RIGHT ARM    Special Requests      BOTTLES DRAWN AEROBIC AND ANAEROBIC Blood Culture adequate volume   Culture  Setup Time      GRAM NEGATIVE RODS IN BOTH AEROBIC AND ANAEROBIC BOTTLES CRITICAL RESULT CALLED TO, READ BACK BY AND VERIFIED WITH: J. FRENS PHARMD, AT [phone] 12/05/21 D. VANHOOK    Culture (A)     KLEBSIELLA PNEUMONIAE SUSCEPTIBILITIES TO FOLLOW Performed at [facility], [place] [address], [place], [place]  [address]    Report Status PENDING   Blood Culture ID Panel (Reflexed)     Status: Abnormal   Collection Time: 12/05/21 12:46 AM  Result Value Ref Range   Enterococcus faecalis NOT DETECTED NOT DETECTED   Enterococcus Faecium NOT DETECTED NOT DETECTED   Listeria monocytogenes NOT DETECTED NOT DETECTED   Staphylococcus species NOT DETECTED NOT DETECTED   Staphylococcus aureus (BCID) NOT DETECTED NOT DETECTED   Staphylococcus epidermidis NOT DETECTED NOT DETECTED   Staphylococcus lugdunensis NOT DETECTED NOT DETECTED   Streptococcus species NOT DETECTED NOT DETECTED   Streptococcus agalactiae NOT DETECTED NOT DETECTED   Streptococcus pneumoniae NOT DETECTED NOT DETECTED   Streptococcus pyogenes NOT DETECTED NOT DETECTED   A.calcoaceticus-baumannii NOT DETECTED NOT DETECTED   Bacteroides fragilis NOT DETECTED NOT DETECTED   Enterobacterales DETECTED (A) NOT DETECTED    Comment: Enterobacterales represent a large order of gram negative bacteria, not a single organism. CRITICAL RESULT CALLED TO, READ BACK BY AND VERIFIED WITH: J. FRENS PHARMD, AT [phone] 12/05/21 D. VANHOOK    Enterobacter cloacae complex NOT DETECTED NOT DETECTED   Escherichia coli NOT DETECTED NOT DETECTED   Klebsiella aerogenes NOT DETECTED NOT DETECTED   Klebsiella oxytoca NOT DETECTED NOT DETECTED   Klebsiella pneumoniae DETECTED (A) NOT DETECTED    Comment: CRITICAL RESULT CALLED TO, READ BACK BY AND VERIFIED WITH: J. FRENS PHARMD, AT [phone] 12/05/21  D. VANHOOK    Proteus species NOT DETECTED NOT DETECTED   Salmonella species NOT DETECTED NOT DETECTED   Serratia marcescens NOT DETECTED NOT DETECTED   Haemophilus influenzae NOT DETECTED NOT DETECTED   Neisseria meningitidis NOT DETECTED NOT DETECTED   Pseudomonas aeruginosa NOT DETECTED NOT DETECTED   Stenotrophomonas maltophilia NOT DETECTED NOT DETECTED   Candida albicans NOT DETECTED NOT DETECTED   Candida auris NOT DETECTED NOT DETECTED   Candida glabrata NOT  DETECTED NOT DETECTED   Candida krusei NOT DETECTED NOT DETECTED   Candida parapsilosis NOT DETECTED NOT DETECTED   Candida tropicalis NOT DETECTED NOT DETECTED   Cryptococcus neoformans/gattii NOT DETECTED NOT DETECTED   CTX-M ESBL NOT DETECTED NOT DETECTED   Carbapenem resistance IMP NOT DETECTED NOT DETECTED   Carbapenem resistance KPC NOT DETECTED NOT DETECTED   Carbapenem resistance NDM NOT DETECTED NOT DETECTED   Carbapenem resist OXA 48 LIKE NOT DETECTED NOT DETECTED   Carbapenem resistance VIM NOT DETECTED NOT DETECTED    Comment: Performed at [facility], [place] [address], [place], [place] [address]  Lactic acid, plasma     Status: None   Collection Time: 12/05/21  2:54 AM  Result Value Ref Range   Lactic Acid, Venous 1.8 0.5 - 1.9 mmol/L    Comment: Performed at [facility] [address] [place], [place] [address]  Basic metabolic panel     Status: Abnormal   Collection Time: 12/05/21  4:25 AM  Result Value Ref Range   Sodium 133 (L) 135 - 145 mmol/L   Potassium 3.6 3.5 - 5.1 mmol/L   Chloride 99 98 - 111 mmol/L   CO2 25 22 - 32 mmol/L   Glucose, Bld 150 (H) 70 - 99 mg/dL    Comment: Glucose reference range applies only to samples taken after fasting for at least 8 hours.   BUN 31 (H) 8 - 23 mg/dL   Creatinine, Ser 1.42 (H) 0.61 - 1.24 mg/dL   Calcium 8.2 (L) 8.9 - 10.3 mg/dL   GFR, Estimated 49 (L) >60 mL/min    Comment: (NOTE) Calculated using the CKD-EPI Creatinine Equation (2021)    Anion gap 9 5 - 15    Comment: Performed at [facility] [address], [place], [place] [address]  Lipid panel     Status: Abnormal   Collection Time: 12/05/21  4:25 AM  Result Value Ref Range   Cholesterol 118 0 - 200 mg/dL   Triglycerides 120 <150 mg/dL   HDL 35 (L) >40 mg/dL   Total CHOL/HDL Ratio 3.4 RATIO   VLDL 24 0 - 40 mg/dL   LDL Cholesterol 59 0 - 99 mg/dL    Comment:        Total Cholesterol/HDL:CHD Risk Coronary Heart Disease Risk Table                      Men   Women  1/2 Average Risk   3.4   3.3  Average Risk       5.0   4.4  2 X Average Risk   9.6   7.1  3 X Average Risk  23.4   11.0        Use the calculated Patient Ratio above and the CHD Risk Table to determine the patient's CHD Risk.        ATP III CLASSIFICATION (LDL):  <100     mg/dL   Optimal  100-129  mg/dL   Near or Above                    Optimal  130-159  mg/dL   Borderline  160-189  mg/dL   High  >190     mg/dL   Very High Performed at [facility] [address] [place], [place] [address]   CBC     Status: Abnormal   Collection Time: 12/05/21  4:25 AM  Result Value Ref Range   WBC 5.1 4.0 - 10.5 K/uL   RBC 3.90 (L) 4.22 - 5.81 MIL/uL   Hemoglobin 11.2 (L) 13.0 - 17.0 g/dL   HCT 32.7 (L) 39.0 - 52.0 %   MCV 83.8 80.0 - 100.0 fL   MCH 28.7 26.0 - 34.0 pg   MCHC 34.3 30.0 - 36.0 g/dL   RDW 14.9 11.5 - 15.5 %   Platelets 56 (L) 150 - 400 K/uL    Comment: SPECIMEN CHECKED FOR CLOTS Immature Platelet Fraction may be clinically indicated, consider ordering this additional test [id number] REPEATED TO VERIFY PLATELET COUNT CONFIRMED BY SMEAR    nRBC 0.0 0.0 - 0.2 %    Comment: Performed at [facility], [place] [address], [place], [place] [address]  C-reactive protein     Status: Abnormal   Collection Time: 12/05/21  6:45 AM  Result Value Ref Range   CRP 16.3 (H) <1.0 mg/dL    Comment: Performed at [facility] [address], [place], [place] [address]  Brain natriuretic peptide     Status: Abnormal   Collection Time: 12/05/21  6:45 AM  Result Value Ref Range   B Natriuretic Peptide 181.6 (H) 0.0 - 100.0 pg/mL    Comment: Performed at [facility] [address] [place], [place] [address]  Magnesium     Status: Abnormal   Collection Time: 12/05/21  6:45 AM  Result Value Ref Range   Magnesium 1.6 (L) 1.7 - 2.4 mg/dL    Comment: Performed at [facility] [address] [place], [place] [address]  Procalcitonin  - Baseline     Status: None   Collection Time: 12/05/21  6:45 AM  Result Value Ref Range   Procalcitonin 40.42 ng/mL    Comment:        Interpretation: PCT >= 10 ng/mL: Important systemic inflammatory response, almost exclusively due to severe bacterial sepsis or septic shock. (NOTE)       Sepsis PCT Algorithm           Lower Respiratory Tract                                      Infection PCT Algorithm    ----------------------------     ----------------------------         PCT < 0.25 ng/mL                PCT < 0.10 ng/mL          Strongly encourage             Strongly discourage   discontinuation of antibiotics    initiation of antibiotics    ----------------------------     -----------------------------       PCT 0.25 - 0.50 ng/mL            PCT 0.10 - 0.25 ng/mL  OR       >80% decrease in PCT            Discourage initiation of                                            antibiotics      Encourage discontinuation           of antibiotics    ----------------------------     -----------------------------         PCT >= 0.50 ng/mL              PCT 0.26 - 0.50 ng/mL                AND       <80% decrease in PCT             Encourage initiation of                                             antibiotics       Encourage continuation           of antibiotics    ----------------------------     -----------------------------        PCT >= 0.50 ng/mL                  PCT > 0.50 ng/mL               AND         increase in PCT                  Strongly encourage                                      initiation of antibiotics    Strongly encourage escalation           of antibiotics                                     -----------------------------                                           PCT <= 0.25 ng/mL                                                 OR                                        > 80% decrease in PCT                                      Discontinue / Do  not initiate  antibiotics  Performed at [facility], [place] [address], [place], [place] [address]   Glucose, capillary     Status: Abnormal   Collection Time: 12/05/21  9:47 AM  Result Value Ref Range   Glucose-Capillary 101 (H) 70 - 99 mg/dL    Comment: Glucose reference range applies only to samples taken after fasting for at least 8 hours.  Glucose, capillary     Status: Abnormal   Collection Time: 12/05/21  1:18 PM  Result Value Ref Range   Glucose-Capillary 193 (H) 70 - 99 mg/dL    Comment: Glucose reference range applies only to samples taken after fasting for at least 8 hours.  Glucose, capillary     Status: Abnormal   Collection Time: 12/05/21  4:16 PM  Result Value Ref Range   Glucose-Capillary 205 (H) 70 - 99 mg/dL    Comment: Glucose reference range applies only to samples taken after fasting for at least 8 hours.  Glucose, capillary     Status: Abnormal   Collection Time: 12/05/21  8:14 PM  Result Value Ref Range   Glucose-Capillary 198 (H) 70 - 99 mg/dL    Comment: Glucose reference range applies only to samples taken after fasting for at least 8 hours.  Heparin level (unfractionated)     Status: None   Collection Time: 12/06/21 12:40 AM  Result Value Ref Range   Heparin Unfractionated 0.44 0.30 - 0.70 IU/mL    Comment: (NOTE) The clinical reportable range upper limit is being lowered to >1.10 to align with the FDA approved guidance for the current laboratory assay.  If heparin results are below expected values, and patient dosage has  been confirmed, suggest follow up testing of antithrombin III levels. Performed at [facility], [place] [address], [place], [place] [address]   Magnesium     Status: None   Collection Time: 12/06/21  4:04 AM  Result Value Ref Range   Magnesium 1.9 1.7 - 2.4 mg/dL    Comment: Performed at [facility] [address] [place], [place] [address]  C-reactive protein      Status: Abnormal   Collection Time: 12/06/21  4:04 AM  Result Value Ref Range   CRP 14.8 (H) <1.0 mg/dL    Comment: Performed at [facility] [address], [place], [place] [address]  Comprehensive metabolic panel     Status: Abnormal   Collection Time: 12/06/21  4:04 AM  Result Value Ref Range   Sodium 130 (L) 135 - 145 mmol/L   Potassium 3.5 3.5 - 5.1 mmol/L   Chloride 101 98 - 111 mmol/L   CO2 24 22 - 32 mmol/L   Glucose, Bld 93 70 - 99 mg/dL    Comment: Glucose reference range applies only to samples taken after fasting for at least 8 hours.   BUN 19 8 - 23 mg/dL   Creatinine, Ser 1.07 0.61 - 1.24 mg/dL   Calcium 7.8 (L) 8.9 - 10.3 mg/dL   Total Protein 4.8 (L) 6.5 - 8.1 g/dL   Albumin 2.1 (L) 3.5 - 5.0 g/dL   AST 59 (H) 15 - 41 U/L   ALT 24 0 - 44 U/L   Alkaline Phosphatase 70 38 - 126 U/L   Total Bilirubin 0.7 0.3 - 1.2 mg/dL   GFR, Estimated >60 >60 mL/min    Comment: (NOTE) Calculated using the CKD-EPI Creatinine Equation (2021)    Anion gap 5 5 - 15  Comment: Performed at [facility], [place] [address] [place], [place] [address]  Brain natriuretic peptide     Status: Abnormal   Collection Time: 12/06/21  4:04 AM  Result Value Ref Range   B Natriuretic Peptide 111.6 (H) 0.0 - 100.0 pg/mL    Comment: Performed at [facility] [address], [place], [place] [address]  CBC with Differential/Platelet     Status: Abnormal   Collection Time: 12/06/21  4:04 AM  Result Value Ref Range   WBC 8.6 4.0 - 10.5 K/uL   RBC 3.74 (L) 4.22 - 5.81 MIL/uL   Hemoglobin 10.9 (L) 13.0 - 17.0 g/dL   HCT 31.4 (L) 39.0 - 52.0 %   MCV 84.0 80.0 - 100.0 fL   MCH 29.1 26.0 - 34.0 pg   MCHC 34.7 30.0 - 36.0 g/dL   RDW 14.9 11.5 - 15.5 %   Platelets 56 (L) 150 - 400 K/uL    Comment: Immature Platelet Fraction may be clinically indicated, consider ordering this additional test [id number] CONSISTENT WITH PREVIOUS RESULT REPEATED TO VERIFY    nRBC 0.0 0.0 - 0.2 %    Neutrophils Relative % 79 %   Neutro Abs 6.9 1.7 - 7.7 K/uL   Lymphocytes Relative 15 %   Lymphs Abs 1.3 0.7 - 4.0 K/uL   Monocytes Relative 3 %   Monocytes Absolute 0.3 0.1 - 1.0 K/uL   Eosinophils Relative 1 %   Eosinophils Absolute 0.1 0.0 - 0.5 K/uL   Basophils Relative 1 %   Basophils Absolute 0.0 0.0 - 0.1 K/uL   Immature Granulocytes 1 %   Abs Immature Granulocytes 0.05 0.00 - 0.07 K/uL    Comment: Performed at [facility], [place] [address] [place], [place] [address]  Procalcitonin     Status: None   Collection Time: 12/06/21  4:04 AM  Result Value Ref Range   Procalcitonin 26.49 ng/mL    Comment:        Interpretation: PCT >= 10 ng/mL: Important systemic inflammatory response, almost exclusively due to severe bacterial sepsis or septic shock. (NOTE)       Sepsis PCT Algorithm           Lower Respiratory Tract                                      Infection PCT Algorithm    ----------------------------     ----------------------------         PCT < 0.25 ng/mL                PCT < 0.10 ng/mL          Strongly encourage             Strongly discourage   discontinuation of antibiotics    initiation of antibiotics    ----------------------------     -----------------------------       PCT 0.25 - 0.50 ng/mL            PCT 0.10 - 0.25 ng/mL               OR       >80% decrease in PCT            Discourage initiation of  antibiotics      Encourage discontinuation           of antibiotics    ----------------------------     -----------------------------         PCT >= 0.50 ng/mL              PCT 0.26 - 0.50 ng/mL                AND       <80% decrease in PCT             Encourage initiation of                                             antibiotics       Encourage continuation           of antibiotics    ----------------------------     -----------------------------        PCT >= 0.50 ng/mL                  PCT > 0.50 ng/mL                AND         increase in PCT                  Strongly encourage                                      initiation of antibiotics    Strongly encourage escalation           of antibiotics                                     -----------------------------                                           PCT <= 0.25 ng/mL                                                 OR                                        > 80% decrease in PCT                                      Discontinue / Do not initiate                                             antibiotics  Performed at [facility], [place] [address] [place], [place] [address]   Glucose, capillary     Status: Abnormal   Collection  Time: 12/06/21  9:28 AM  Result Value Ref Range   Glucose-Capillary 62 (L) 70 - 99 mg/dL    Comment: Glucose reference range applies only to samples taken after fasting for at least 8 hours.  Heparin level (unfractionated)     Status: None   Collection Time: 12/06/21 10:02 AM  Result Value Ref Range   Heparin Unfractionated 0.60 0.30 - 0.70 IU/mL    Comment: (NOTE) The clinical reportable range upper limit is being lowered to >1.10 to align with the FDA approved guidance for the current laboratory assay.  If heparin results are below expected values, and patient dosage has  been confirmed, suggest follow up testing of antithrombin III levels. Performed at [facility], [place] [address] [place], [place] [address]   Uric acid     Status: None   Collection Time: 12/06/21 10:02 AM  Result Value Ref Range   Uric Acid, Serum 4.4 3.7 - 8.6 mg/dL    Comment: Performed at [facility] [address], [place], [place] [address]  Osmolality     Status: None   Collection Time: 12/06/21 10:02 AM  Result Value Ref Range   Osmolality 279 275 - 295 mOsm/kg    Comment: Performed at [facility] [address] [name], [place] [address]  Glucose, capillary     Status: None   Collection Time:  12/06/21 10:14 AM  Result Value Ref Range   Glucose-Capillary 89 70 - 99 mg/dL    Comment: Glucose reference range applies only to samples taken after fasting for at least 8 hours.  Glucose, capillary     Status: Abnormal   Collection Time: 12/06/21 12:55 PM  Result Value Ref Range   Glucose-Capillary 187 (H) 70 - 99 mg/dL    Comment: Glucose reference range applies only to samples taken after fasting for at least 8 hours.  Urinalysis, Routine w reflex microscopic Urine, Random     Status: Abnormal   Collection Time: 12/06/21  1:50 PM  Result Value Ref Range   Color, Urine YELLOW YELLOW   APPearance CLEAR CLEAR   Specific Gravity, Urine 1.026 1.005 - 1.030   pH 7.0 5.0 - 8.0   Glucose, UA 150 (A) NEGATIVE mg/dL   Hgb urine dipstick LARGE (A) NEGATIVE   Bilirubin Urine NEGATIVE NEGATIVE   Ketones, ur NEGATIVE NEGATIVE mg/dL   Protein, ur 30 (A) NEGATIVE mg/dL   Nitrite NEGATIVE NEGATIVE   Leukocytes,Ua TRACE (A) NEGATIVE   RBC / HPF >50 (H) 0 - 5 RBC/hpf   WBC, UA 6-10 0 - 5 WBC/hpf   Bacteria, UA NONE SEEN NONE SEEN    Comment: Performed at [facility] [address], [place], [place] [address]  Sodium, urine, random     Status: None   Collection Time: 12/06/21  1:50 PM  Result Value Ref Range   Sodium, Ur 82 mmol/L    Comment: Performed at [facility] [address] [place], [place] [address]  Osmolality, urine     Status: None   Collection Time: 12/06/21  1:50 PM  Result Value Ref Range   Osmolality, Ur 660 300 - 900 mOsm/kg    Comment: Performed at [facility] [address] [place], [place] [address]  Glucose, capillary     Status: Abnormal   Collection Time: 12/06/21  3:56 PM  Result Value Ref Range   Glucose-Capillary 223 (H) 70 - 99 mg/dL  Comment: Glucose reference range applies only to samples taken after fasting for at least 8 hours.    MICRO:  IMAGING: MR BRAIN WO CONTRAST  Result Date: 12/05/2021 CLINICAL DATA:  81 year old male  acute neurologic deficit. Left side weakness. Metastatic disease unknown primary. Widely scattered small embolic appearing cerebral and cerebellar infarcts in November. New cerebellar hypodensity on CT yesterday. EXAM: MRI HEAD WITHOUT CONTRAST TECHNIQUE: Multiplanar, multiecho pulse sequences of the brain and surrounding structures were obtained without intravenous contrast. COMPARISON:  CTA head and neck yesterday.  Brain MRI 09/16/2021. FINDINGS: Brain: Mostly expected evolution of the scattered small bilateral cerebral and cerebellar infarcts seen in November, including in the right thalamus. But petechial hemorrhagic conversion of multiple bilateral cerebellar infarcts since that time, including confluent petechial hemorrhage in the left hemisphere on series 7, image 23 with faint associated cerebellar edema. And multiple such small hemorrhagic infarcts are new since November but nonacute. No contrast administered. On DWI there is a small contralateral central right cerebellar area of diffusion restriction now (series 2, image 11). a and a small focus of white matter restricted diffusion in the left splenium (series 2, image 28) with T2 and FLAIR hyperintensity. Also questionable punctate restricted diffusion in the left occipital pole white matter, although may be susceptibility artifact from chronic microhemorrhage there also. Furthermore, there are several small bilateral frontal and parietal lobe central white matter foci of abnormal diffusion, although most appear facilitated on ADC (series 2, image 33). Fewer new but chronic microhemorrhages scattered in the supratentorial brain. Underlying confluent bilateral cerebral white matter T2 and FLAIR hyperintensity which is advanced but nonspecific. Stable similar T2 and FLAIR hyperintensity in the pons. Small areas of developing cortical encephalomalacia corresponding to interval small cortical infarcts such as medial superior right parietal lobe series 6,  image 29. No midline shift, obvious mass lesion, ventriculomegaly, extra-axial collection or acute intracranial hemorrhage. Cervicomedullary junction and pituitary are within normal limits. Vascular: Major intracranial vascular flow voids are stable. Skull and upper cervical spine: Heterogeneous bone marrow signal is indeterminate in the visible cervical spine, not definitely progressed since November. No destructive osseous lesion is identified. Sinuses/Orbits: Stable, negative. Other: Mastoids are clear. Visible internal auditory structures appear normal. IMPRESSION: 1. No contrast administered, limiting evaluation for metastatic disease. 2. Progression of embolic appearing small infarcts scattered in the bilateral cerebral and cerebellar hemispheres since November. Many such infarcts demonstrate petechial hemorrhage (including confluent new petechial hemorrhage in the left cerebellum), but no malignant hemorrhagic transformation or intracranial mass effect. Small acute infarcts are identified in the right cerebellum, left splenium. And there are multiple subacute appearing punctate white matter infarcts. 3. Underlying advanced but nonspecific signal changes in the cerebral white matter and pons possibly due to chronic small vessel disease. Electronically Signed   By: Genevie Ann M.D.   On: 12/05/2021 09:28   CT ABDOMEN PELVIS W CONTRAST  Result Date: 12/05/2021 CLINICAL DATA:  Abdominal pain, acute, nonlocalized. Metastatic cancer of unknown primary. EXAM: CT ABDOMEN AND PELVIS WITH CONTRAST TECHNIQUE: Multidetector CT imaging of the abdomen and pelvis was performed using the standard protocol following bolus administration of intravenous contrast. RADIATION DOSE REDUCTION: This exam was performed according to the departmental dose-optimization program which includes automated exposure control, adjustment of the mA and/or kV according to patient size and/or use of iterative reconstruction technique. CONTRAST:   116mL OMNIPAQUE IOHEXOL 300 MG/ML  SOLN COMPARISON:  PET CT 10/10/2021 FINDINGS: Lower chest: No acute abnormality. Hepatobiliary: Multiple heterogeneously enhancing masses are again  seen throughout both hepatic lobes with the dominant conglomerate within segment 5 of the liver similar to that noted on prior PET CT examination. The appearance is relatively stable when compared to prior MRI examination of 09/03/2021. Mild bilobar intrahepatic biliary ductal dilation appears stable since prior MRI examination. Main, right, and left portal veins are patent. The gallbladder appears intimately associated with the malignant masses within segment 5 of the liver and a primary gallbladder carcinoma could be considered. This, too, appears unchanged from prior examination. Pancreas: Unremarkable Spleen: Unremarkable Adrenals/Urinary Tract: The adrenal glands are unremarkable. The kidneys are normal in size and position. 7 cm simple cortical cyst noted within the upper pole of the right kidney. Hyperdensity within the upper pole calices of the right kidney and within the left renal pelvis likely represents excreted contrast. No hydronephrosis. No definite nephro or urolithiasis. The bladder is unremarkable. Stomach/Bowel: The stomach is unremarkable. The second portion the duodenum is in close proximity to the porta hepatis and cystic duct and there is infiltration of the periduodenal soft tissue which may relate to edema or infiltration related to the patient's primary malignancy. This is best appreciated on coronal image # 37/6 focal narrowing of the duodenum in this region may cicatricial change. The small and large bowel are unremarkable. The appendix is not clearly identified and may be absent. No free intraperitoneal gas or fluid. Reflect Vascular/Lymphatic: Aortic atherosclerosis. No enlarged abdominal or pelvic lymph nodes. Reproductive: The prostate gland is enlarged. Metallic densities within the prostate gland may  relate to UroLift procedure. Other: Tiny fat containing umbilical hernia. Musculoskeletal: No acute bone abnormality. No lytic or blastic bone lesion. IMPRESSION: Stable examination. Bilobar hepatic masses in keeping with metastatic disease with dominant conglomerate within segment 5 of the liver in close proximity to the adjacent gallbladder which appears contracted and demonstrates irregular wall thickening. 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 Infiltrative change within the porta hepatis noted with abutment of the second portion of the duodenum without evidence of obstruction or fistula formation at this time. Stable mild intrahepatic biliary ductal dilation. No acute intra-abdominal process identified. Aortic Atherosclerosis (ICD10-I70.0). Electronically Signed   By: Fidela Salisbury M.D.   On: 12/05/2021 21:28   CT HIP LEFT WO CONTRAST  Result Date: 12/05/2021 CLINICAL DATA:  Hip pain. Infection suspected. X-ray done. Acute on chronic left hip pain. Fall at home. History of osteoarthritis. Evaluate for occult fracture. EXAM: CT OF THE LEFT HIP WITHOUT CONTRAST TECHNIQUE: Multidetector CT imaging of the left hip was performed according to the standard protocol. Multiplanar CT image reconstructions were also generated. RADIATION DOSE REDUCTION: This exam was performed according to the departmental dose-optimization program which includes automated exposure control, adjustment of the mA and/or kV according to patient size and/or use of iterative reconstruction technique. COMPARISON:  None available FINDINGS: Bones/Joint/Cartilage Moderate superior left femoroacetabular joint space narrowing. Mild left femoral head-neck junction circumferential degenerative osteophytes. Mild-to-moderate posterosuperior left femoral head-neck junction subchondral degenerative cystic change. Mild pubic symphysis joint space narrowing and peripheral osteophytosis degenerative change. No acute  fracture is seen. No dislocation. No cortical erosion to suggest osteomyelitis. Ligaments Suboptimally assessed by CT. Muscles and Tendons Unremarkable, within the limitations of this modality. Soft tissues No left hip joint effusion. There are metallic densities within the prostate, possibly brachytherapy seeds. The partially visualized prostate is moderately enlarged, impressing on the urinary bladder base. Mild posterior urinary bladder outpouching may represent a small diverticulum superior to the prostate (axial series 5,  image 38). Mild fat containing left inguinal hernia. Mild-to-moderate L5-S1 disc space narrowing, vacuum phenomenon, and peripheral degenerative osteophytosis. IMPRESSION: 1. No acute fracture or CT evidence of osteomyelitis is seen. 2. Moderate left femoroacetabular osteoarthritis. 3. Mild pubic symphysis osteoarthritis. Electronically Signed   By: Yvonne Kendall M.D.   On: 12/05/2021 13:05   DG Chest Port 1 View  Result Date: 12/05/2021 CLINICAL DATA:  81 year old male with shortness of breath. Metastatic disease unknown primary. EXAM: PORTABLE CHEST 1 VIEW COMPARISON:  Chest radiographs 09/20/2021 and earlier. FINDINGS: Portable AP semi upright view at 0610 hours. Right chest power port is new since last year. Lower lung volumes. Mediastinal contours remain within normal limits. Visualized tracheal air column is within normal limits. Diffuse crowding of lung markings with no pneumothorax, pleural effusion, consolidation, or overt edema. No lung nodule identified. Visible osseous structures within normal limits. Paucity of bowel gas. IMPRESSION: Low lung volumes, otherwise no acute cardiopulmonary abnormality. Electronically Signed   By: Genevie Ann M.D.   On: 12/05/2021 06:42   VAS [place] LOWER EXTREMITY VENOUS (DVT)  Result Date: 12/06/2021  Lower Venous DVT Study Patient Name:  YOSMAR RYKER  Date of Exam:   12/05/2021 Medical Rec #: [id number]        Accession #:    [id number] Date of  Birth: 01-Jul-1941        Patient Gender: M Patient Age:   30 years Exam Location[facility]  [facility] Procedure:      VAS [place] LOWER EXTREMITY VENOUS (DVT) Referring Phys: Jennette Kettle --------------------------------------------------------------------------------  Indications: Stroke.  Risk Factors: None identified. Comparison Study: No prior studies. Performing Technologist: Oliver Hum RVT  Examination Guidelines: A complete evaluation includes B-mode imaging, spectral Doppler, color Doppler, and power Doppler as needed of all accessible portions of each vessel. Bilateral testing is considered an integral part of a complete examination. Limited examinations for reoccurring indications may be performed as noted. The reflux portion of the exam is performed with the patient in reverse Trendelenburg.  +---------+---------------+---------+-----------+----------+--------------+  RIGHT     Compressibility Phasicity Spontaneity Properties Thrombus Aging  +---------+---------------+---------+-----------+----------+--------------+  CFV       Full            Yes       Yes                                    +---------+---------------+---------+-----------+----------+--------------+  SFJ       Full                                                             +---------+---------------+---------+-----------+----------+--------------+  FV Prox   Full                                                             +---------+---------------+---------+-----------+----------+--------------+  FV Mid    Full                                                             +---------+---------------+---------+-----------+----------+--------------+  FV Distal Full                                                             +---------+---------------+---------+-----------+----------+--------------+  PFV       Full                                                              +---------+---------------+---------+-----------+----------+--------------+  POP       Full            Yes       Yes                                    +---------+---------------+---------+-----------+----------+--------------+  PTV       Full                                                             +---------+---------------+---------+-----------+----------+--------------+  PERO      Full                                                             +---------+---------------+---------+-----------+----------+--------------+   +---------+---------------+---------+-----------+----------+-----------------+  LEFT      Compressibility Phasicity Spontaneity Properties Thrombus Aging     +---------+---------------+---------+-----------+----------+-----------------+  CFV       Full            Yes       Yes                                       +---------+---------------+---------+-----------+----------+-----------------+  SFJ       Full                                                                +---------+---------------+---------+-----------+----------+-----------------+  FV Prox   Full                                                                +---------+---------------+---------+-----------+----------+-----------------+  FV Mid    Full                                                                +---------+---------------+---------+-----------+----------+-----------------+  FV Distal Full                                                                +---------+---------------+---------+-----------+----------+-----------------+  PFV       Full                                                                +---------+---------------+---------+-----------+----------+-----------------+  POP       Full            Yes       Yes                                       +---------+---------------+---------+-----------+----------+-----------------+  PTV       Full                                                                 +---------+---------------+---------+-----------+----------+-----------------+  PERO      Partial                                          Age Indeterminate  +---------+---------------+---------+-----------+----------+-----------------+     Summary: RIGHT: - There is no evidence of deep vein thrombosis in the lower extremity.  - No cystic structure found in the popliteal fossa.  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 - No cystic structure found in the popliteal fossa.  *See table(s) above for measurements and observations. Electronically signed by Orlie Pollen on 12/06/2021 at 6:50:45 AM.    Final    ECHOCARDIOGRAM LIMITED  Result Date: 12/05/2021    ECHOCARDIOGRAM LIMITED REPORT   Patient Name:   RUTGER SALTON Date of Exam: 12/05/2021 Medical Rec #:  [id number]       Height:       70.0 in Accession #:    [id number]      Weight:       180.1 lb Date of Birth:  24-Dec-1940       BSA:          1.996 m Patient Age:    44 years        BP:           144/89 mmHg Patient Gender: M               HR:           83 bpm. Exam Location:  Inpatient Procedure: Limited Echo, Limited Color Doppler and Cardiac Doppler Indications:    stroke  History:        Patient has prior history of Echocardiogram examinations, most  recent 09/17/2021. Cancer; Risk Factors:Diabetes and                 Hypertension.  Sonographer:    Johny Chess RDCS Referring Phys: [place]  1. LV function decreased since 09/17/21 no strain imaging performed . Left ventricular ejection fraction, by estimation, is 50 to 55%. The left ventricle has low normal function. The left ventricle has no regional wall motion abnormalities. The left ventricular internal cavity size was mildly dilated. There is mild left ventricular hypertrophy. Left ventricular diastolic parameters are consistent with Grade I diastolic dysfunction (impaired relaxation).  2.  Right ventricular systolic function is normal. The right ventricular size is normal.  3. The mitral valve is abnormal. Trivial mitral valve regurgitation. No evidence of mitral stenosis.  4. The aortic valve is tricuspid. Aortic valve regurgitation is not visualized. No aortic stenosis is present.  5. Aortic dilatation noted. There is mild dilatation of the ascending aorta, measuring 39 mm.  6. The inferior vena cava is normal in size with greater than 50% respiratory variability, suggesting right atrial pressure of 3 mmHg. FINDINGS  Left Ventricle: LV function decreased since 09/17/21 no strain imaging performed. Left ventricular ejection fraction, by estimation, is 50 to 55%. The left ventricle has low normal function. The left ventricle has no regional wall motion abnormalities. The left ventricular internal cavity size was mildly dilated. There is mild left ventricular hypertrophy. Left ventricular diastolic parameters are consistent with Grade I diastolic dysfunction (impaired relaxation). Right Ventricle: The right ventricular size is normal. No increase in right ventricular wall thickness. Right ventricular systolic function is normal. Left Atrium: Left atrial size was normal in size. Right Atrium: Right atrial size was normal in size. Pericardium: There is no evidence of pericardial effusion. Mitral Valve: The mitral valve is abnormal. There is mild thickening of the mitral valve leaflet(s). Trivial mitral valve regurgitation. No evidence of mitral valve stenosis. Tricuspid Valve: The tricuspid valve is normal in structure. Tricuspid valve regurgitation is mild . No evidence of tricuspid stenosis. Aortic Valve: The aortic valve is tricuspid. Aortic valve regurgitation is not visualized. No aortic stenosis is present. Pulmonic Valve: The pulmonic valve was normal in structure. Pulmonic valve regurgitation is not visualized. No evidence of pulmonic stenosis. Aorta: Aortic dilatation noted. There is mild  dilatation of the ascending aorta, measuring 39 mm. Venous: The inferior vena cava is normal in size with greater than 50% respiratory variability, suggesting right atrial pressure of 3 mmHg. IAS/Shunts: No atrial level shunt detected by color flow Doppler. LEFT VENTRICLE PLAX 2D LVIDd:         4.40 cm     Diastology LVIDs:         3.30 cm     LV e' medial:    3.92 cm/s LV PW:         1.00 cm     LV E/e' medial:  13.8 LV IVS:        1.30 cm     LV e' lateral:   6.74 cm/s                            LV E/e' lateral: 8.0  LV Volumes (MOD) LV vol d, MOD A2C: 75.9 ml LV vol s, MOD A2C: 47.1 ml LV SV MOD A2C:     28.8 ml IVC IVC diam: 1.40 cm LEFT ATRIUM         Index LA  diam:    3.30 cm 1.65 cm/m  AORTIC VALVE LVOT Vmax:   81.10 cm/s LVOT Vmean:  55.700 cm/s LVOT VTI:    0.130 m  AORTA Ao Asc diam: 3.90 cm MITRAL VALVE MV Area (PHT): 2.96 cm    SHUNTS MV Decel Time: 256 msec    Systemic VTI: 0.13 m MV E velocity: 54.00 cm/s MV A velocity: 85.30 cm/s MV E/A ratio:  0.63 Jenkins Rouge MD Electronically signed by Jenkins Rouge MD Signature Date/Time: 12/05/2021/12:01:58 PM    Final      Assessment/Plan:  81yo M immunocompromised host, 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 Concurrently found to have klebsiella pneumoniae bacteremia, likely from GI source. Question to whether this gram negative endocarditis. TTE does not show endocarditis. - continue on ceftriaxone 2gm Iv daily -once closer to discharge, can finish out the course of oral abtx - can successfully treat with port in place if we plan to repeat blood cx once he completes his abtx course to ensure he does not have recurrence. - repeat blood cx to see if still ongoing bacteremia today.  Elzie Rings [facility] [phone]

## 2021-12-06 NOTE — Progress Notes (Deleted)
Oncology Discharge Planning Admission Note  [facility] Address: [place], [place], [place] [address] Hours of Operation:  8am - 5pm, Monday - Friday  Clinic Contact Information:  [phone]) [phone]  Oncology Care Team: Medical Oncologist:    Myrtha Mantis, NP is aware of this hospital admission dated 12/04/21 and has assessed patient at the bedside. The cancer center will follow [facility] inpatient care to assist with discharge planning as indicated by the oncologist.  We will reach out closer to discharge date to arrange follow up care.  Disclaimer:  This [place] note does not imply a formal consult request has been made by the admitting attending for this admission or there will be an inpatient consult completed by oncology.  Please request oncology consults as per standard process as indicated.

## 2021-12-06 NOTE — Progress Notes (Signed)
PROGRESS NOTE                                                                                                                                                                                                             Patient Demographics:    Robert Warner, is a 81 y.o. male, DOB - January 25, 1941, XBL:[id number]  Outpatient Primary MD for the patient is [name], MD    LOS - 0  Admit date - 12/04/2021    CC -poor balance and difficulty in walking     Brief Narrative (HPI from H&P) - Robert Warner is a 81 y.o. 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  He presented to the ER with dizziness and poor balance.  He was admitted for further stroke work-up.  's work-up was suggestive of embolic stroke in the presence of right-to-left shunt, new DVT and Klebsiella bacteremia.  CT scan now suggest possible underlying gallbladder malignancy.  GI, oncology and neurology have been consulted   Subjective:   Patient in bed, appears comfortable, denies any headache, no fever, no chest pain or pressure, no shortness of breath , no abdominal pain. No new focal weakness.   Assessment  & Plan :    Progression of embolic appearing small infarcts scattered in the bilateral cerebral and cerebellar hemispheres since November. Many such infarcts demonstrate petechial hemorrhage. Small acute infarcts are identified in the right cerebellum, left splenium and there are multiple subacute appearing punctate white matter infarcts - he has recent history of embolic infarcts in November 2022 with work-up positive for right-to-left shunt.  He was on Plavix since November 2022.  He was  currently on Plavix and statin combination for secondary prevention, and had a breakthrough stroke again this admission, seen by neurology and stroke team, with his history of left to right shunt lower extremity venous duplex was ordered which was positive for acute DVT.  He is now on heparin drip for close monitoring in the light of his underlying acute strokes with mild petechial conversion.  If he stays stable for 2 to 3 days  on heparin drip he can be transition to Eliquis.  2.  Acute on chronic left hip pain fall at home.  Labile with supportive care CT hip unremarkable.  3.  Hx of metastatic cancer with unknown primary under the care of [name].  He finished his fourth cycle of FOLFOX on 11/30/2020.  CT scan done this admission now suggestive of possible gallbladder malignancy.  GI and Dr. Rondel Oh both consulted on 12/06/2021  4.  Klebsiella bacteremia incidental finding on 12/05/2020.  On 2 g IV Rocephin source most likely gallbladder pathology as above, continue to monitor on IV antibiotics.  5.  Known history of left shunt.  May require closure in the light of ongoing strokes.  Consult cardiology as he is having recurrent strokes and may require right to left shunt closure sooner than later.  6.  Hyponatremia.  Initially due to dehydration now getting worse on IV fluids, stop IV fluids check serum and urine osmolality along with urine sodium, gentle low-dose one-time oral Lasix on 12/06/2021 after urine sample is obtained.  7.  AKI.  Resolved after hydration with IV fluids.  8 .  Hypertension.  For permissive hypertension due to acute CVA.    9. DM type II.  Lantus and sliding scale.  Lab Results  Component Value Date   HGBA1C 11.9 (H) 12/04/2021   CBG (last 3)  Recent Labs    12/05/21 1318 12/05/21 1616 12/05/21 2014  GLUCAP 193* 205* 198*         Condition - Extremely Guarded  Family Communication  :  wife [name] [phone] on 12/05/21, 12/06/2021  Code  Status :  Full  Consults  :  Neuro, GI, oncology  PUD Prophylaxis :     Procedures  :     MRI - 1. No contrast administered, limiting evaluation for metastatic disease. 2. Progression of embolic appearing small infarcts scattered in the bilateral cerebral and cerebellar hemispheres since November. Many such infarcts demonstrate petechial hemorrhage (including confluent new petechial hemorrhage in the left cerebellum), but no malignant hemorrhagic transformation or intracranial mass effect. Small acute infarcts are identified in the right cerebellum, left splenium. And there are multiple subacute appearing punctate white matter infarcts. 3. Underlying advanced but nonspecific signal changes in the cerebral white matter and pons possibly due to chronic small vessel disease.  CTA - 1. CT perfusion negative for acute infarct or ischemia 2. Negative for intracranial large vessel occlusion 3. No carotid or vertebral artery stenosis in the neck. No significant atherosclerotic disease. 4. CT head demonstrates new hypodensity right mid cerebellum. This could represent interval acute infarct or possibly metastatic disease given history. Follow-up MRI brain without and with contrast recommended. Additional chronic ischemic changes stable from prior studies. 5. 8.5 mm spiculated nodule right upper lobe, stable from prior PET-CT 10/10/2021       Disposition Plan  :    Status is: Observation    DVT Prophylaxis  :  Hep gtt    Lab Results  Component Value Date   PLT 56 (L) 12/06/2021    Diet :  Diet Order             Diet Carb Modified Fluid consistency: Thin; Room service appropriate? Yes  Diet effective now                    Inpatient Medications  Scheduled Meds:  atorvastatin  40 mg Oral Daily   Chlorhexidine Gluconate Cloth  6 each Topical Daily  insulin aspart  0-15 Units Subcutaneous TID WC   insulin glargine-yfgn  20 Units Subcutaneous Daily   Continuous Infusions:  sodium  chloride 100 mL/hr at 12/06/21 0602   cefTRIAXone (ROCEPHIN)  IV 2 g (12/05/21 1353)   heparin 1,200 Units/hr (12/05/21 1640)   PRN Meds:.acetaminophen **OR** acetaminophen (TYLENOL) oral liquid 160 mg/5 mL **OR** acetaminophen, sodium chloride flush  Antibiotics  :    Anti-infectives (From admission, onward)    Start     Dose/Rate Route Frequency Ordered Stop   12/05/21 1430  cefTRIAXone (ROCEPHIN) 2 g in sodium chloride 0.9 % 100 mL IVPB        2 g 200 mL/hr over 30 Minutes Intravenous Every 24 hours 12/05/21 1339          Time Spent in minutes  30   Lala Lund M.D on 12/06/2021 at 9:26 AM  To page go to [email or web address]   [facility] -  Office  [phone]  See all Orders from today for further details    Objective:   Vitals:   12/05/21 0742 12/05/21 1614 12/05/21 1924 12/06/21 0753  BP: (!) 144/89 (!) 141/93 114/84 130/87  Pulse: 89  91 81  Resp: 13 17 (!) 21 15  Temp: 98.9 F (37.2 C) 98.4 F (36.9 C) 98.6 F (37 C) 98.4 F (36.9 C)  TempSrc: Oral Oral Oral Oral  SpO2: (!) 86%  94% 98%  Weight:      Height:        Wt Readings from Last 3 Encounters:  12/04/21 81.7 kg  12/02/21 81.4 kg  11/30/21 79.9 kg     Intake/Output Summary (Last 24 hours) at 12/06/2021 0926 Last data filed at 12/06/2021 0756 Gross per 24 hour  Intake 2138.56 ml  Output 700 ml  Net 1438.56 ml     Physical Exam  Awake Alert, No new F.N deficits, Normal affect [place].AT,PERRAL Supple Neck, No JVD,   Symmetrical Chest wall movement, Good air movement bilaterally, CTAB RRR,No Gallops, Rubs or new Murmurs,  +ve B.Sounds, Abd Soft, No tenderness,   No Cyanosis, Clubbing or edema        Data Review:    CBC Recent Labs  Lab 11/30/21 1010 12/04/21 1309 12/04/21 1340 12/05/21 0425 12/06/21 0404  WBC 9.5 4.9  --  5.1 8.6  HGB 11.8* 11.3* 12.2* 11.2* 10.9*  HCT 35.2* 33.8* 36.0* 32.7* 31.4*  PLT 88* 75*  --  56* 56*  MCV 83.8 84.3  --  83.8 84.0  MCH 28.1 28.2   --  28.7 29.1  MCHC 33.5 33.4  --  34.3 34.7  RDW 15.0 14.9  --  14.9 14.9  LYMPHSABS 1.0 0.2*  --   --  1.3  MONOABS 0.8 0.0*  --   --  0.3  EOSABS 0.0 0.0  --   --  0.1  BASOSABS 0.0 0.0  --   --  0.0    Electrolytes Recent Labs  Lab 11/30/21 1010 12/04/21 1309 12/04/21 1340 12/04/21 1503 12/04/21 2257 12/05/21 0037 12/05/21 0254 12/05/21 0425 12/05/21 0645 12/06/21 0404  NA 131* 130* 128*  --  133*  --   --  133*  --  130*  K 4.0 4.5 4.8  --  4.1  --   --  3.6  --  3.5  CL 95* 93*  --   --  95*  --   --  99  --  101  CO2 27 22  --   --  26  --   --  25  --  24  GLUCOSE 412* 467*  --   --  290*  --   --  150*  --  93  BUN 21 42*  --   --  34*  --   --  31*  --  19  CREATININE 1.21 2.07*  --   --  1.54*  --   --  1.42*  --  1.07  CALCIUM 9.1 8.8*  --   --  8.7*  --   --  8.2*  --  7.8*  AST 24 31  --   --   --   --   --   --   --  59*  ALT 21 21  --   --   --   --   --   --   --  24  ALKPHOS 77 73  --   --   --   --   --   --   --  70  BILITOT 1.2 0.8  --   --   --   --   --   --   --  0.7  ALBUMIN 3.4* 3.2*  --   --   --   --   --   --   --  2.1*  MG  --   --   --   --   --   --   --   --  1.6* 1.9  CRP  --   --   --  11.9*  --   --   --   --  16.3* 14.8*  PROCALCITON  --   --   --   --   --   --   --   --  40.42 26.49  LATICACIDVEN  --   --   --   --   --  4.2* 1.8  --   --   --   INR  --  1.2  --   --   --   --   --   --   --   --   TSH  --   --   --  0.465  --   --   --   --   --   --   HGBA1C  --   --   --   --  11.9*  --   --   --   --   --   BNP  --   --   --   --   --   --   --   --  181.6* 111.6*    ------------------------------------------------------------------------------------------------------------------ Recent Labs    12/05/21 0425  CHOL 118  HDL 35*  LDLCALC 59  TRIG 120  CHOLHDL 3.4    Lab Results  Component Value Date   HGBA1C 11.9 (H) 12/04/2021    Recent Labs    12/04/21 1503  TSH 0.465    ------------------------------------------------------------------------------------------------------------------ ID Labs Recent Labs  Lab 11/30/21 1010 12/04/21 1309 12/04/21 1503 12/04/21 2257 12/05/21 0037 12/05/21 0254 12/05/21 0425 12/05/21 0645 12/06/21 0404  WBC 9.5 4.9  --   --   --   --  5.1  --  8.6  PLT 88* 75*  --   --   --   --  56*  --  56*  CRP  --   --  11.9*  --   --   --   --  16.3* 14.8*  PROCALCITON  --   --   --   --   --   --   --  40.42 26.49  LATICACIDVEN  --   --   --   --  4.2* 1.8  --   --   --   CREATININE 1.21 2.07*  --  1.54*  --   --  1.42*  --  1.07   Cardiac Enzymes No results for input(s): CKMB, TROPONINI, MYOGLOBIN in the last 168 hours.  Invalid input(s): CK  Radiology Reports MR BRAIN WO CONTRAST  Result Date: 12/05/2021 CLINICAL DATA:  81 year old male acute neurologic deficit. Left side weakness. Metastatic disease unknown primary. Widely scattered small embolic appearing cerebral and cerebellar infarcts in November. New cerebellar hypodensity on CT yesterday. EXAM: MRI HEAD WITHOUT CONTRAST TECHNIQUE: Multiplanar, multiecho pulse sequences of the brain and surrounding structures were obtained without intravenous contrast. COMPARISON:  CTA head and neck yesterday.  Brain MRI 09/16/2021. FINDINGS: Brain: Mostly expected evolution of the scattered small bilateral cerebral and cerebellar infarcts seen in November, including in the right thalamus. But petechial hemorrhagic conversion of multiple bilateral cerebellar infarcts since that time, including confluent petechial hemorrhage in the left hemisphere on series 7, image 23 with faint associated cerebellar edema. And multiple such small hemorrhagic infarcts are new since November but nonacute. No contrast administered. On DWI there is a small contralateral central right cerebellar area of diffusion restriction now (series 2, image 11). a and a small focus of white matter restricted diffusion in  the left splenium (series 2, image 28) with T2 and FLAIR hyperintensity. Also questionable punctate restricted diffusion in the left occipital pole white matter, although may be susceptibility artifact from chronic microhemorrhage there also. Furthermore, there are several small bilateral frontal and parietal lobe central white matter foci of abnormal diffusion, although most appear facilitated on ADC (series 2, image 33). Fewer new but chronic microhemorrhages scattered in the supratentorial brain. Underlying confluent bilateral cerebral white matter T2 and FLAIR hyperintensity which is advanced but nonspecific. Stable similar T2 and FLAIR hyperintensity in the pons. Small areas of developing cortical encephalomalacia corresponding to interval small cortical infarcts such as medial superior right parietal lobe series 6, image 29. No midline shift, obvious mass lesion, ventriculomegaly, extra-axial collection or acute intracranial hemorrhage. Cervicomedullary junction and pituitary are within normal limits. Vascular: Major intracranial vascular flow voids are stable. Skull and upper cervical spine: Heterogeneous bone marrow signal is indeterminate in the visible cervical spine, not definitely progressed since November. No destructive osseous lesion is identified. Sinuses/Orbits: Stable, negative. Other: Mastoids are clear. Visible internal auditory structures appear normal. IMPRESSION: 1. No contrast administered, limiting evaluation for metastatic disease. 2. Progression of embolic appearing small infarcts scattered in the bilateral cerebral and cerebellar hemispheres since November. Many such infarcts demonstrate petechial hemorrhage (including confluent new petechial hemorrhage in the left cerebellum), but no malignant hemorrhagic transformation or intracranial mass effect. Small acute infarcts are identified in the right cerebellum, left splenium. And there are multiple subacute appearing punctate white matter  infarcts. 3. Underlying advanced but nonspecific signal changes in the cerebral white matter and pons possibly due to chronic small vessel disease. Electronically Signed   By: Genevie Ann M.D.   On: 12/05/2021 09:28   CT ABDOMEN PELVIS W CONTRAST  Result Date: 12/05/2021 CLINICAL DATA:  Abdominal pain, acute, nonlocalized. Metastatic cancer of unknown primary. EXAM: CT ABDOMEN AND PELVIS WITH CONTRAST TECHNIQUE: Multidetector CT imaging of the abdomen and pelvis was performed  using the standard protocol following bolus administration of intravenous contrast. RADIATION DOSE REDUCTION: This exam was performed according to the departmental dose-optimization program which includes automated exposure control, adjustment of the mA and/or kV according to patient size and/or use of iterative reconstruction technique. CONTRAST:  123mL OMNIPAQUE IOHEXOL 300 MG/ML  SOLN COMPARISON:  PET CT 10/10/2021 FINDINGS: Lower chest: No acute abnormality. Hepatobiliary: Multiple heterogeneously enhancing masses are again seen throughout both hepatic lobes with the dominant conglomerate within segment 5 of the liver similar to that noted on prior PET CT examination. The appearance is relatively stable when compared to prior MRI examination of 09/03/2021. Mild bilobar intrahepatic biliary ductal dilation appears stable since prior MRI examination. Main, right, and left portal veins are patent. The gallbladder appears intimately associated with the malignant masses within segment 5 of the liver and a primary gallbladder carcinoma could be considered. This, too, appears unchanged from prior examination. Pancreas: Unremarkable Spleen: Unremarkable Adrenals/Urinary Tract: The adrenal glands are unremarkable. The kidneys are normal in size and position. 7 cm simple cortical cyst noted within the upper pole of the right kidney. Hyperdensity within the upper pole calices of the right kidney and within the left renal pelvis likely represents  excreted contrast. No hydronephrosis. No definite nephro or urolithiasis. The bladder is unremarkable. Stomach/Bowel: The stomach is unremarkable. The second portion the duodenum is in close proximity to the porta hepatis and cystic duct and there is infiltration of the periduodenal soft tissue which may relate to edema or infiltration related to the patient's primary malignancy. This is best appreciated on coronal image # 37/6 focal narrowing of the duodenum in this region may cicatricial change. The small and large bowel are unremarkable. The appendix is not clearly identified and may be absent. No free intraperitoneal gas or fluid. Reflect Vascular/Lymphatic: Aortic atherosclerosis. No enlarged abdominal or pelvic lymph nodes. Reproductive: The prostate gland is enlarged. Metallic densities within the prostate gland may relate to UroLift procedure. Other: Tiny fat containing umbilical hernia. Musculoskeletal: No acute bone abnormality. No lytic or blastic bone lesion. IMPRESSION: Stable examination. Bilobar hepatic masses in keeping with metastatic disease with dominant conglomerate within segment 5 of the liver in close proximity to the adjacent gallbladder which appears contracted and demonstrates irregular wall thickening. 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 Infiltrative change within the porta hepatis noted with abutment of the second portion of the duodenum without evidence of obstruction or fistula formation at this time. Stable mild intrahepatic biliary ductal dilation. No acute intra-abdominal process identified. Aortic Atherosclerosis (ICD10-I70.0). Electronically Signed   By: Fidela Salisbury M.D.   On: 12/05/2021 21:28   CT HIP LEFT WO CONTRAST  Result Date: 12/05/2021 CLINICAL DATA:  Hip pain. Infection suspected. X-ray done. Acute on chronic left hip pain. Fall at home. History of osteoarthritis. Evaluate for occult fracture. EXAM: CT OF THE LEFT HIP WITHOUT  CONTRAST TECHNIQUE: Multidetector CT imaging of the left hip was performed according to the standard protocol. Multiplanar CT image reconstructions were also generated. RADIATION DOSE REDUCTION: This exam was performed according to the departmental dose-optimization program which includes automated exposure control, adjustment of the mA and/or kV according to patient size and/or use of iterative reconstruction technique. COMPARISON:  None available FINDINGS: Bones/Joint/Cartilage Moderate superior left femoroacetabular joint space narrowing. Mild left femoral head-neck junction circumferential degenerative osteophytes. Mild-to-moderate posterosuperior left femoral head-neck junction subchondral degenerative cystic change. Mild pubic symphysis joint space narrowing and peripheral osteophytosis degenerative change. No acute fracture is  seen. No dislocation. No cortical erosion to suggest osteomyelitis. Ligaments Suboptimally assessed by CT. Muscles and Tendons Unremarkable, within the limitations of this modality. Soft tissues No left hip joint effusion. There are metallic densities within the prostate, possibly brachytherapy seeds. The partially visualized prostate is moderately enlarged, impressing on the urinary bladder base. Mild posterior urinary bladder outpouching may represent a small diverticulum superior to the prostate (axial series 5, image 38). Mild fat containing left inguinal hernia. Mild-to-moderate L5-S1 disc space narrowing, vacuum phenomenon, and peripheral degenerative osteophytosis. IMPRESSION: 1. No acute fracture or CT evidence of osteomyelitis is seen. 2. Moderate left femoroacetabular osteoarthritis. 3. Mild pubic symphysis osteoarthritis. Electronically Signed   By: Yvonne Kendall M.D.   On: 12/05/2021 13:05   DG Chest Port 1 View  Result Date: 12/05/2021 CLINICAL DATA:  81 year old male with shortness of breath. Metastatic disease unknown primary. EXAM: PORTABLE CHEST 1 VIEW COMPARISON:   Chest radiographs 09/20/2021 and earlier. FINDINGS: Portable AP semi upright view at 0610 hours. Right chest power port is new since last year. Lower lung volumes. Mediastinal contours remain within normal limits. Visualized tracheal air column is within normal limits. Diffuse crowding of lung markings with no pneumothorax, pleural effusion, consolidation, or overt edema. No lung nodule identified. Visible osseous structures within normal limits. Paucity of bowel gas. IMPRESSION: Low lung volumes, otherwise no acute cardiopulmonary abnormality. Electronically Signed   By: Genevie Ann M.D.   On: 12/05/2021 06:42   VAS [place] LOWER EXTREMITY VENOUS (DVT)  Result Date: 12/06/2021  Lower Venous DVT Study Patient Name:  SHALOM MCGUINESS  Date of Exam:   12/05/2021 Medical Rec #: [id number]        Accession #:    [id number] Date of Birth: 1941-09-30        Patient Gender: M Patient Age:   24 years Exam Location:  [facility] Procedure:      VAS [place] LOWER EXTREMITY VENOUS (DVT) Referring Phys: Jennette Kettle --------------------------------------------------------------------------------  Indications: Stroke.  Risk Factors: None identified. Comparison Study: No prior studies. Performing Technologist: Oliver Hum RVT  Examination Guidelines: A complete evaluation includes B-mode imaging, spectral Doppler, color Doppler, and power Doppler as needed of all accessible portions of each vessel. Bilateral testing is considered an integral part of a complete examination. Limited examinations for reoccurring indications may be performed as noted. The reflux portion of the exam is performed with the patient in reverse Trendelenburg.  +---------+---------------+---------+-----------+----------+--------------+  RIGHT     Compressibility Phasicity Spontaneity Properties Thrombus Aging  +---------+---------------+---------+-----------+----------+--------------+  CFV       Full            Yes       Yes                                     +---------+---------------+---------+-----------+----------+--------------+  SFJ       Full                                                             +---------+---------------+---------+-----------+----------+--------------+  FV Prox   Full                                                             +---------+---------------+---------+-----------+----------+--------------+  FV Mid    Full                                                             +---------+---------------+---------+-----------+----------+--------------+  FV Distal Full                                                             +---------+---------------+---------+-----------+----------+--------------+  PFV       Full                                                             +---------+---------------+---------+-----------+----------+--------------+  POP       Full            Yes       Yes                                    +---------+---------------+---------+-----------+----------+--------------+  PTV       Full                                                             +---------+---------------+---------+-----------+----------+--------------+  PERO      Full                                                             +---------+---------------+---------+-----------+----------+--------------+   +---------+---------------+---------+-----------+----------+-----------------+  LEFT      Compressibility Phasicity Spontaneity Properties Thrombus Aging     +---------+---------------+---------+-----------+----------+-----------------+  CFV       Full            Yes       Yes                                       +---------+---------------+---------+-----------+----------+-----------------+  SFJ       Full                                                                +---------+---------------+---------+-----------+----------+-----------------+  FV Prox   Full                                                                 +---------+---------------+---------+-----------+----------+-----------------+  FV Mid    Full                                                                +---------+---------------+---------+-----------+----------+-----------------+  FV Distal Full                                                                +---------+---------------+---------+-----------+----------+-----------------+  PFV       Full                                                                +---------+---------------+---------+-----------+----------+-----------------+  POP       Full            Yes       Yes                                       +---------+---------------+---------+-----------+----------+-----------------+  PTV       Full                                                                +---------+---------------+---------+-----------+----------+-----------------+  PERO      Partial                                          Age Indeterminate  +---------+---------------+---------+-----------+----------+-----------------+     Summary: RIGHT: - There is no evidence of deep vein thrombosis in the lower extremity.  - No cystic structure found in the popliteal fossa.  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 - No cystic structure found in the popliteal fossa.  *See table(s) above for measurements and observations. Electronically signed by Orlie Pollen on 12/06/2021 at 6:50:45 AM.    Final    ECHOCARDIOGRAM LIMITED  Result Date: 12/05/2021    ECHOCARDIOGRAM LIMITED REPORT   Patient Name:   TABITHA TUPPER Date of Exam: 12/05/2021 Medical Rec #:  [id number]       Height:       70.0 in Accession #:    [id number]      Weight:       180.1 lb Date of Birth:  1941-06-16       BSA:          1.996 m Patient Age:    37 years        BP:           144/89 mmHg Patient Gender:  M               HR:           83 bpm. Exam Location:  Inpatient Procedure: Limited Echo, Limited Color  Doppler and Cardiac Doppler Indications:    stroke  History:        Patient has prior history of Echocardiogram examinations, most                 recent 09/17/2021. Cancer; Risk Factors:Diabetes and                 Hypertension.  Sonographer:    Johny Chess RDCS Referring Phys: [place]  1. LV function decreased since 09/17/21 no strain imaging performed . Left ventricular ejection fraction, by estimation, is 50 to 55%. The left ventricle has low normal function. The left ventricle has no regional wall motion abnormalities. The left ventricular internal cavity size was mildly dilated. There is mild left ventricular hypertrophy. Left ventricular diastolic parameters are consistent with Grade I diastolic dysfunction (impaired relaxation).  2. Right ventricular systolic function is normal. The right ventricular size is normal.  3. The mitral valve is abnormal. Trivial mitral valve regurgitation. No evidence of mitral stenosis.  4. The aortic valve is tricuspid. Aortic valve regurgitation is not visualized. No aortic stenosis is present.  5. Aortic dilatation noted. There is mild dilatation of the ascending aorta, measuring 39 mm.  6. The inferior vena cava is normal in size with greater than 50% respiratory variability, suggesting right atrial pressure of 3 mmHg. FINDINGS  Left Ventricle: LV function decreased since 09/17/21 no strain imaging performed. Left ventricular ejection fraction, by estimation, is 50 to 55%. The left ventricle has low normal function. The left ventricle has no regional wall motion abnormalities. The left ventricular internal cavity size was mildly dilated. There is mild left ventricular hypertrophy. Left ventricular diastolic parameters are consistent with Grade I diastolic dysfunction (impaired relaxation). Right Ventricle: The right ventricular size is normal. No increase in right ventricular wall thickness. Right ventricular systolic function is normal. Left  Atrium: Left atrial size was normal in size. Right Atrium: Right atrial size was normal in size. Pericardium: There is no evidence of pericardial effusion. Mitral Valve: The mitral valve is abnormal. There is mild thickening of the mitral valve leaflet(s). Trivial mitral valve regurgitation. No evidence of mitral valve stenosis. Tricuspid Valve: The tricuspid valve is normal in structure. Tricuspid valve regurgitation is mild . No evidence of tricuspid stenosis. Aortic Valve: The aortic valve is tricuspid. Aortic valve regurgitation is not visualized. No aortic stenosis is present. Pulmonic Valve: The pulmonic valve was normal in structure. Pulmonic valve regurgitation is not visualized. No evidence of pulmonic stenosis. Aorta: Aortic dilatation noted. There is mild dilatation of the ascending aorta, measuring 39 mm. Venous: The inferior vena cava is normal in size with greater than 50% respiratory variability, suggesting right atrial pressure of 3 mmHg. IAS/Shunts: No atrial level shunt detected by color flow Doppler. LEFT VENTRICLE PLAX 2D LVIDd:         4.40 cm     Diastology LVIDs:         3.30 cm     LV e' medial:    3.92 cm/s LV PW:         1.00 cm     LV E/e' medial:  13.8 LV IVS:        1.30 cm     LV e' lateral:  6.74 cm/s                            LV E/e' lateral: 8.0  LV Volumes (MOD) LV vol d, MOD A2C: 75.9 ml LV vol s, MOD A2C: 47.1 ml LV SV MOD A2C:     28.8 ml IVC IVC diam: 1.40 cm LEFT ATRIUM         Index LA diam:    3.30 cm 1.65 cm/m  AORTIC VALVE LVOT Vmax:   81.10 cm/s LVOT Vmean:  55.700 cm/s LVOT VTI:    0.130 m  AORTA Ao Asc diam: 3.90 cm MITRAL VALVE MV Area (PHT): 2.96 cm    SHUNTS MV Decel Time: 256 msec    Systemic VTI: 0.13 m MV E velocity: 54.00 cm/s MV A velocity: 85.30 cm/s MV E/A ratio:  0.63 Jenkins Rouge MD Electronically signed by Jenkins Rouge MD Signature Date/Time: 12/05/2021/12:01:58 PM    Final    CT ANGIO HEAD NECK W WO CM W PERF (CODE STROKE)  Result Date:  12/04/2021 CLINICAL DATA:  Acute neuro deficit. Left-sided weakness. History of metastatic disease. EXAM: CT ANGIOGRAPHY HEAD AND NECK CT PERFUSION BRAIN TECHNIQUE: Multidetector CT imaging of the head and neck was performed using the standard protocol during bolus administration of intravenous contrast. Multiplanar CT image reconstructions and MIPs were obtained to evaluate the vascular anatomy. Carotid stenosis measurements (when applicable) are obtained utilizing NASCET criteria, using the distal internal carotid diameter as the denominator. Multiphase CT imaging of the brain was performed following IV bolus contrast injection. Subsequent parametric perfusion maps were calculated using RAPID software. RADIATION DOSE REDUCTION: This exam was performed according to the departmental dose-optimization program which includes automated exposure control, adjustment of the mA and/or kV according to patient size and/or use of iterative reconstruction technique. CONTRAST:  185mL OMNIPAQUE IOHEXOL 350 MG/ML SOLN COMPARISON:  CT head 11/18/2021 FINDINGS: CT HEAD FINDINGS Brain: Generalized atrophy. Chronic microvascular ischemic change throughout the white matter. Chronic infarct right thalamus. Interval contraction of hypodensity in the right cerebellum compatible with resolving infarct. Chronic infarct left superior cerebellum. Hypodensity in the central right cerebellum has progressed from prior studies and may be recent extension of a previously noted infarct. This measures 1 cm. Chronic infarct right superior cerebellum unchanged Negative for hemorrhage or mass. Vascular: Negative for hyperdense vessel Skull: Negative Sinuses: Paranasal sinuses clear Orbits: Bilateral cataract extraction. ASPECTS ([place] Stroke Program Early CT Score) - Ganglionic level infarction (caudate, lentiform nuclei, internal capsule, insula, M1-M3 cortex): 7 - Supraganglionic infarction (M4-M6 cortex): 3 Total score (0-10 with 10 being  normal): 10 Review of the MIP images confirms the above findings CTA NECK FINDINGS Aortic arch: Normal aortic arch. Proximal great vessels widely patent. Bovine branching arch. Right carotid system: Right carotid widely patent without stenosis or atherosclerotic disease. Left carotid system: Left carotid widely patent. Negative for atherosclerotic disease or stenosis Vertebral arteries: Both vertebral arteries widely patent to the skull base. No significant stenosis. Skeleton: Degenerative changes in the cervical spine. No acute skeletal abnormality. Other neck: Negative for mass or edema in the neck. Port-A-Cath tip right jugular vein. Upper chest: 8.5 mm spiculated nodule right upper lobe unchanged from PET-CT 10/10/2021. No other lung nodule in the apices. Review of the MIP images confirms the above findings CTA HEAD FINDINGS Anterior circulation: Cavernous carotid widely patent bilaterally. Anterior and middle cerebral arteries widely patent bilaterally. No stenosis or vascular malformation. Posterior circulation: Both vertebral arteries  patent to the basilar. Basilar widely patent. PICA patent bilaterally. AICA, superior cerebellar, posterior cerebral arteries patent without stenosis or large vessel occlusion. Negative for cerebrovascular malformation. Venous sinuses: Normal venous enhancement Anatomic variants: None Review of the MIP images confirms the above findings CT Brain Perfusion Findings: ASPECTS: 10 CBF (<30%) Volume: 11mL Perfusion (Tmax>6.0s) volume: 16mL Mismatch Volume: 66mL Infarction Location:None IMPRESSION: 1. CT perfusion negative for acute infarct or ischemia 2. Negative for intracranial large vessel occlusion 3. No carotid or vertebral artery stenosis in the neck. No significant atherosclerotic disease. 4. CT head demonstrates new hypodensity right mid cerebellum. This could represent interval acute infarct or possibly metastatic disease given history. Follow-up MRI brain without and with  contrast recommended. Additional chronic ischemic changes stable from prior studies. 5. 8.5 mm spiculated nodule right upper lobe, stable from prior PET-CT 10/10/2021 Electronically Signed   By: Franchot Gallo M.D.   On: 12/04/2021 14:08

## 2021-12-06 NOTE — Progress Notes (Addendum)
HEMATOLOGY-ONCOLOGY PROGRESS NOTE  ASSESSMENT AND PLAN: Poorly differentiated adenocarcinoma involving the liver Abdominal ultrasound 08/05/2021-indeterminate 29m solid mass in the right hepatic lobe.   MRI of the liver 09/06/2021-multifocal rim-enhancing lesions throughout both lobes of the liver.  Index lesion within the lateral dome of right hepatic lobe measures 1.1 cm, segment 4A lesion measures 1.6 x 1.4 cm, segment 2 lesion measures 0.8 x 0.7 cm, posteromedial margin of the right hepatic lobe subcapsular lesion measures 2.5 x 2.0 cm 09/16/2021 CT angio neck-9 mm right upper lobe nodule Biopsy of a right liver mass on 09/23/2021.  Pathology shows poorly differentiated adenocarcinoma positive for cytokeratin 7, CDX2 and cytokeratin 20 and negative for TTF-1, PSA and prostein.  Differential diagnoses include pancreatobiliary and less likely upper GI. HER2 positive by FISH PD-L1 combined positive score 0% MSS equivocal, tumor mutation burden 0, ERBB2 amplification equivocal 09/29/2021 CA 19-9 81, CEA 33 PET scan 10/10/2021-solitary 10 mm right upper lobe pulmonary nodule with minimal FDG uptake.  7.5 mm left internal mammary lymph node hypermetabolic with SUV max 41.06  Numerous hepatic lesions.  Periportal lymphadenopathy SUV max 8.18.  2 adjacent lesions noted in the mesentery in the right mid abdomen with somewhat irregular margins, SUV 8.09.  No hypermetabolic colonic lesion identified to suggest a primary colon cancer.  No pancreatic lesion.  No gastric lesion.  No retroperitoneal lymphadenopathy. Upper endoscopy 10/13/2021-no mass, H. pylori gastritis on gastric biopsy Cycle 1 FOLFOX 10/19/2021 Cycle 2 FOLFOX 11/02/2021, Emend added, oxaliplatin dose reduced Cycle 3 FOLFOX 11/16/2021 Cycle 4 FOLFOX 11/30/2021 CT abdomen/pelvis with contrast on 12/05/2021-stable exam with by lobar hepatic masses, findings suggestive of metastatic gallbladder cancer. Hospital admission 09/16/2021 - 09/19/2021 with  gait disturbance-multifocal embolic stroke on MRI 126/07/4853 echo with interatrial shunt, transcranial Doppler with bubble study indicative of a medium size right to left shunt, lower extremity Doppler studies negative for DVT Hospital admission 09/20/2021 - 09/22/2021 with chest pain-CT coronary with no acute findings.   Diabetes Hypertension BPH Chronic kidney disease Brain CT 11/18/2021-new "lesion" in the left cerebellum when compared to brain MRI 09/16/2021.  Appearance of rapid development suspicious for a subacute infarct.  Metastatic focus not excluded.  Follow-up brain MRI with contrast recommended in the next 4 to 6 weeks. [facility]  Klebsiella bacteremia 12/05/2021 11.  Thrombocytopenia secondary to chemotherapy and bacteremia  Mr. BGaviahas been admitted to the hospital due to progression of embolic small infarcts.  Is been seen by neurology and is on a heparin drip.  Wife reports some mild confusion still but overall he has improved.  Neurology is recommending heparin and then considering transition to Eliquis prior to discharge.  He has been being treated for cancer of unknown primary.  However, CT imaging performed this admission now suggest metastatic gallbladder cancer.  These findings were discussed with the patient and his wife.  We discussed that FOLFOX would be the appropriate regimen if this was in fact metastatic gallbladder cancer.  He will be seen as an outpatient to discuss additional chemotherapy versus consideration of hospice if performance status not improving.  His CBC has been reviewed.  His hemoglobin and platelets are slightly decreased likely due to recent chemotherapy.  His platelet count is stable at this time.  From our standpoint, okay to proceed with anticoagulation.  Will defer decision regarding which anticoagulant to neurology.  Recommendations: 1.  Anticoagulation per neurology. 2.  Monitor CBC. 3.  Outpatient follow-up will  be scheduled at the  cancer center to discuss additional systemic therapy versus hospice. 4.  Antibiotics per the infectious disease service  Please call medical oncology as needed.  Mikey Bussing, DNP, AGPCNP-BC, AOCNP  [name] was interviewed and examined.  He is known to me with a history of metastatic carcinoma of unknown primary.  He most likely has an upper GI primary.  He has completed 4 cycles of FOLFOX chemotherapy.  He is now admitted with stroke symptoms with an MRI confirming multifocal strokes.  He has a lower extremity DVT and a known PFO.  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  He is now at day 7 from the most recent cycle of chemotherapy.  Thrombocytopenia secondary to chemotherapy and bacteremia.  He has Klebsiella bacteremia with no clear source for infection identified.  There is no clinical evidence for infection of the Port-A-Cath, but this is possible.  I suspect bacteremia is related to the gastrointestinal malignancy.  He had an abdominal CT 12/05/2021.  Review of the images is most consistent with stable disease in the liver and right lower quadrant mesenteric mass.  The best comparison imaging would be an abdominal MRI for direct comparison of the liver metastases.  I discussed the imaging results and treatment options with [name] and his wife.  They understand he may not be a candidate for further chemotherapy unless his clinical status improves.  We will consider a referral for hospice care depending on his status over the next few weeks.  I will check on him 12/08/2021.  I was present for greater than 50% of today's visit.  I performed medical decision making.  Julieanne Manson, MD SUBJECTIVE: Mr. Dotzler is followed by our office for cancer of unknown primary.  He has been receiving systemic chemotherapy with FOLFOX.  This was last given on 11/30/2021.  Now admitted for stroke.  Neurology team is  following.  He is on a heparin drip.  He is sitting up in the recliner this afternoon and eating lunch.  Wife reports some mild confusion still.  No bleeding reported.  He has no other complaints today.  Oncology History  Cancer with unknown primary site [facility])  10/11/2021 Initial Diagnosis   Cancer with unknown primary site ([place])   10/19/2021 -  Chemotherapy   Patient is on Treatment Plan : COLORECTAL FOLFOX q14d      PHYSICAL EXAMINATION:  Vitals:   12/05/21 1924 12/06/21 0753  BP: 114/84 130/87  Pulse: 91 81  Resp: (!) 21 15  Temp: 98.6 F (37 C) 98.4 F (36.9 C)  SpO2: 94% 98%   Filed Weights   12/04/21 2049  Weight: 81.7 kg    Intake/Output from previous day: 01/23 0701 - 01/24 0700 In: 2138.6 [I.V.:1988.6; IV Piggyback:150] Out: -   Physical Exam Vitals reviewed.  Constitutional:      General: He is not in acute distress. HENT:     Head: Normocephalic.     Mouth/Throat:     Mouth: Mucous membranes are moist.     Pharynx: No oropharyngeal exudate or posterior oropharyngeal erythema.  Eyes:     General: No scleral icterus. Cardiovascular:     Rate and Rhythm: Normal rate.  Pulmonary:     Effort: Pulmonary effort is normal. No respiratory distress.  Abdominal:     General: Bowel sounds are normal.     Palpations: Abdomen is soft.  Skin:    General: Skin is warm and dry.  Neurological:  Mental Status: He is alert and oriented to person, place, and time.    LABORATORY DATA:  I have reviewed the data as listed CMP Latest Ref Rng & Units 12/06/2021 12/05/2021 12/04/2021  Glucose 70 - 99 mg/dL 93 150(H) 290(H)  BUN 8 - 23 mg/dL 19 31(H) 34(H)  Creatinine 0.61 - 1.24 mg/dL 1.07 1.42(H) 1.54(H)  Sodium 135 - 145 mmol/L 130(L) 133(L) 133(L)  Potassium 3.5 - 5.1 mmol/L 3.5 3.6 4.1  Chloride 98 - 111 mmol/L 101 99 95(L)  CO2 22 - 32 mmol/L _0 Calcium 8.9 - 10.3 mg/dL 7.8(L) 8.2(L) 8.7(L)  Total Protein 6.5 - 8.1 g/dL 4.8(L) - -  Total Bilirubin 0.3 -  1.2 mg/dL 0.7 - -  Alkaline Phos 38 - 126 U/L 70 - -  AST 15 - 41 U/L 59(H) - -  ALT 0 - 44 U/L 24 - -    Lab Results  Component Value Date   WBC 8.6 12/06/2021   HGB 10.9 (L) 12/06/2021   HCT 31.4 (L) 12/06/2021   MCV 84.0 12/06/2021   PLT 56 (L) 12/06/2021   NEUTROABS 6.9 12/06/2021    Lab Results  Component Value Date   CEA 33.79 (H) 09/29/2021   CAN199 81 (H) 09/29/2021    CT HEAD W & WO CONTRAST (5MM)  Result Date: 11/18/2021 CLINICAL DATA:  History of cancer from unknown primary. Headache for 1 month which is on and off. Known metastases to the liver, question left parietal skull lesion. EXAM: CT HEAD WITHOUT AND WITH CONTRAST TECHNIQUE: Contiguous axial images were obtained from the base of the skull through the vertex without and with intravenous contrast CONTRAST:  21m OMNIPAQUE IOHEXOL 300 MG/ML  SOLN COMPARISON:  Brain MRI 09/16/2021 FINDINGS: Brain: Low-density with vague high-density/low-grade enhancement in the left cerebellum, in total measuring approximately 2.3 cm in diameter. Low-density in the right thalamus where there was previously demonstrated infarction. Chronic lacune in the right caudate head. No hydrocephalus or collection. Vascular: Major vessels are enhancing. Skull: Questioned parietal bone lesion in the history. A lucency in the high left parietal bone has the appearance of a venous structure. No aggressive bone lesion. Sinuses/Orbits: Negative These results will be called to the ordering clinician or representative by the Radiologist Assistant, and communication documented in the PACS or [employer] IMPRESSION: New "lesion" in the left cerebellum when compared to brain MRI 09/16/2021. Appearance and rapid development is suspicious for a subacute infarct (multiple embolic infarcts seen on prior brain MRI). Metastatic focus is not excluded, recommend follow-up brain MRI with contrast in the next 4-6 weeks. Electronically Signed   By: JJorje GuildM.D.    On: 11/18/2021 18:00   MR BRAIN WO CONTRAST  Result Date: 12/05/2021 CLINICAL DATA:  81year old male acute neurologic deficit. Left side weakness. Metastatic disease unknown primary. Widely scattered small embolic appearing cerebral and cerebellar infarcts in November. New cerebellar hypodensity on CT yesterday. EXAM: MRI HEAD WITHOUT CONTRAST TECHNIQUE: Multiplanar, multiecho pulse sequences of the brain and surrounding structures were obtained without intravenous contrast. COMPARISON:  CTA head and neck yesterday.  Brain MRI 09/16/2021. FINDINGS: Brain: Mostly expected evolution of the scattered small bilateral cerebral and cerebellar infarcts seen in November, including in the right thalamus. But petechial hemorrhagic conversion of multiple bilateral cerebellar infarcts since that time, including confluent petechial hemorrhage in the left hemisphere on series 7, image 23 with faint associated cerebellar edema. And multiple such small hemorrhagic infarcts are new since November but nonacute.  No contrast administered. On DWI there is a small contralateral central right cerebellar area of diffusion restriction now (series 2, image 11). a and a small focus of white matter restricted diffusion in the left splenium (series 2, image 28) with T2 and FLAIR hyperintensity. Also questionable punctate restricted diffusion in the left occipital pole white matter, although may be susceptibility artifact from chronic microhemorrhage there also. Furthermore, there are several small bilateral frontal and parietal lobe central white matter foci of abnormal diffusion, although most appear facilitated on ADC (series 2, image 33). Fewer new but chronic microhemorrhages scattered in the supratentorial brain. Underlying confluent bilateral cerebral white matter T2 and FLAIR hyperintensity which is advanced but nonspecific. Stable similar T2 and FLAIR hyperintensity in the pons. Small areas of developing cortical encephalomalacia  corresponding to interval small cortical infarcts such as medial superior right parietal lobe series 6, image 29. No midline shift, obvious mass lesion, ventriculomegaly, extra-axial collection or acute intracranial hemorrhage. Cervicomedullary junction and pituitary are within normal limits. Vascular: Major intracranial vascular flow voids are stable. Skull and upper cervical spine: Heterogeneous bone marrow signal is indeterminate in the visible cervical spine, not definitely progressed since November. No destructive osseous lesion is identified. Sinuses/Orbits: Stable, negative. Other: Mastoids are clear. Visible internal auditory structures appear normal. IMPRESSION: 1. No contrast administered, limiting evaluation for metastatic disease. 2. Progression of embolic appearing small infarcts scattered in the bilateral cerebral and cerebellar hemispheres since November. Many such infarcts demonstrate petechial hemorrhage (including confluent new petechial hemorrhage in the left cerebellum), but no malignant hemorrhagic transformation or intracranial mass effect. Small acute infarcts are identified in the right cerebellum, left splenium. And there are multiple subacute appearing punctate white matter infarcts. 3. Underlying advanced but nonspecific signal changes in the cerebral white matter and pons possibly due to chronic small vessel disease. Electronically Signed   By: Genevie Ann M.D.   On: 12/05/2021 09:28   CT ABDOMEN PELVIS W CONTRAST  Result Date: 12/05/2021 CLINICAL DATA:  Abdominal pain, acute, nonlocalized. Metastatic cancer of unknown primary. EXAM: CT ABDOMEN AND PELVIS WITH CONTRAST TECHNIQUE: Multidetector CT imaging of the abdomen and pelvis was performed using the standard protocol following bolus administration of intravenous contrast. RADIATION DOSE REDUCTION: This exam was performed according to the departmental dose-optimization program which includes automated exposure control, adjustment of  the mA and/or kV according to patient size and/or use of iterative reconstruction technique. CONTRAST:  159m OMNIPAQUE IOHEXOL 300 MG/ML  SOLN COMPARISON:  PET CT 10/10/2021 FINDINGS: Lower chest: No acute abnormality. Hepatobiliary: Multiple heterogeneously enhancing masses are again seen throughout both hepatic lobes with the dominant conglomerate within segment 5 of the liver similar to that noted on prior PET CT examination. The appearance is relatively stable when compared to prior MRI examination of 09/03/2021. Mild bilobar intrahepatic biliary ductal dilation appears stable since prior MRI examination. Main, right, and left portal veins are patent. The gallbladder appears intimately associated with the malignant masses within segment 5 of the liver and a primary gallbladder carcinoma could be considered. This, too, appears unchanged from prior examination. Pancreas: Unremarkable Spleen: Unremarkable Adrenals/Urinary Tract: The adrenal glands are unremarkable. The kidneys are normal in size and position. 7 cm simple cortical cyst noted within the upper pole of the right kidney. Hyperdensity within the upper pole calices of the right kidney and within the left renal pelvis likely represents excreted contrast. No hydronephrosis. No definite nephro or urolithiasis. The bladder is unremarkable. Stomach/Bowel: The stomach is unremarkable. The second portion  the duodenum is in close proximity to the porta hepatis and cystic duct and there is infiltration of the periduodenal soft tissue which may relate to edema or infiltration related to the patient's primary malignancy. This is best appreciated on coronal image # 37/6 focal narrowing of the duodenum in this region may cicatricial change. The small and large bowel are unremarkable. The appendix is not clearly identified and may be absent. No free intraperitoneal gas or fluid. Reflect Vascular/Lymphatic: Aortic atherosclerosis. No enlarged abdominal or pelvic lymph  nodes. Reproductive: The prostate gland is enlarged. Metallic densities within the prostate gland may relate to UroLift procedure. Other: Tiny fat containing umbilical hernia. Musculoskeletal: No acute bone abnormality. No lytic or blastic bone lesion. IMPRESSION: Stable examination. Bilobar hepatic masses in keeping with metastatic disease with dominant conglomerate within segment 5 of the liver in close proximity to the adjacent gallbladder which appears contracted and demonstrates irregular wall thickening. 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 Infiltrative change within the porta hepatis noted with abutment of the second portion of the duodenum without evidence of obstruction or fistula formation at this time. Stable mild intrahepatic biliary ductal dilation. No acute intra-abdominal process identified. Aortic Atherosclerosis (ICD10-I70.0). Electronically Signed   By: Fidela Salisbury M.D.   On: 12/05/2021 21:28   CT HIP LEFT WO CONTRAST  Result Date: 12/05/2021 CLINICAL DATA:  Hip pain. Infection suspected. X-ray done. Acute on chronic left hip pain. Fall at home. History of osteoarthritis. Evaluate for occult fracture. EXAM: CT OF THE LEFT HIP WITHOUT CONTRAST TECHNIQUE: Multidetector CT imaging of the left hip was performed according to the standard protocol. Multiplanar CT image reconstructions were also generated. RADIATION DOSE REDUCTION: This exam was performed according to the departmental dose-optimization program which includes automated exposure control, adjustment of the mA and/or kV according to patient size and/or use of iterative reconstruction technique. COMPARISON:  None available FINDINGS: Bones/Joint/Cartilage Moderate superior left femoroacetabular joint space narrowing. Mild left femoral head-neck junction circumferential degenerative osteophytes. Mild-to-moderate posterosuperior left femoral head-neck junction subchondral degenerative cystic change.  Mild pubic symphysis joint space narrowing and peripheral osteophytosis degenerative change. No acute fracture is seen. No dislocation. No cortical erosion to suggest osteomyelitis. Ligaments Suboptimally assessed by CT. Muscles and Tendons Unremarkable, within the limitations of this modality. Soft tissues No left hip joint effusion. There are metallic densities within the prostate, possibly brachytherapy seeds. The partially visualized prostate is moderately enlarged, impressing on the urinary bladder base. Mild posterior urinary bladder outpouching may represent a small diverticulum superior to the prostate (axial series 5, image 38). Mild fat containing left inguinal hernia. Mild-to-moderate L5-S1 disc space narrowing, vacuum phenomenon, and peripheral degenerative osteophytosis. IMPRESSION: 1. No acute fracture or CT evidence of osteomyelitis is seen. 2. Moderate left femoroacetabular osteoarthritis. 3. Mild pubic symphysis osteoarthritis. Electronically Signed   By: Yvonne Kendall M.D.   On: 12/05/2021 13:05   DG Chest Port 1 View  Result Date: 12/05/2021 CLINICAL DATA:  81 year old male with shortness of breath. Metastatic disease unknown primary. EXAM: PORTABLE CHEST 1 VIEW COMPARISON:  Chest radiographs 09/20/2021 and earlier. FINDINGS: Portable AP semi upright view at 0610 hours. Right chest power port is new since last year. Lower lung volumes. Mediastinal contours remain within normal limits. Visualized tracheal air column is within normal limits. Diffuse crowding of lung markings with no pneumothorax, pleural effusion, consolidation, or overt edema. No lung nodule identified. Visible osseous structures within normal limits. Paucity of bowel gas. IMPRESSION: Low lung volumes, otherwise  no acute cardiopulmonary abnormality. Electronically Signed   By: Genevie Ann M.D.   On: 12/05/2021 06:42   VAS [place] LOWER EXTREMITY VENOUS (DVT)  Result Date: 12/06/2021  Lower Venous DVT Study Patient Name:  SHYLOH KRINKE  Date of Exam:   12/05/2021 Medical Rec #: [id number]        Accession #:    [id number] Date of Birth: 20-Mar-1941        Patient Gender: M Patient Age:   81 years Exam Location:  [facility] Procedure:      VAS [place] LOWER EXTREMITY VENOUS (DVT) Referring Phys: Jennette Kettle --------------------------------------------------------------------------------  Indications: Stroke.  Risk Factors: None identified. Comparison Study: No prior studies. Performing Technologist: Oliver Hum RVT  Examination Guidelines: A complete evaluation includes B-mode imaging, spectral Doppler, color Doppler, and power Doppler as needed of all accessible portions of each vessel. Bilateral testing is considered an integral part of a complete examination. Limited examinations for reoccurring indications may be performed as noted. The reflux portion of the exam is performed with the patient in reverse Trendelenburg.  +---------+---------------+---------+-----------+----------+--------------+  RIGHT     Compressibility Phasicity Spontaneity Properties Thrombus Aging  +---------+---------------+---------+-----------+----------+--------------+  CFV       Full            Yes       Yes                                    +---------+---------------+---------+-----------+----------+--------------+  SFJ       Full                                                             +---------+---------------+---------+-----------+----------+--------------+  FV Prox   Full                                                             +---------+---------------+---------+-----------+----------+--------------+  FV Mid    Full                                                             +---------+---------------+---------+-----------+----------+--------------+  FV Distal Full                                                             +---------+---------------+---------+-----------+----------+--------------+  PFV       Full                                                              +---------+---------------+---------+-----------+----------+--------------+  POP       Full            Yes       Yes                                    +---------+---------------+---------+-----------+----------+--------------+  PTV       Full                                                             +---------+---------------+---------+-----------+----------+--------------+  PERO      Full                                                             +---------+---------------+---------+-----------+----------+--------------+   +---------+---------------+---------+-----------+----------+-----------------+  LEFT      Compressibility Phasicity Spontaneity Properties Thrombus Aging     +---------+---------------+---------+-----------+----------+-----------------+  CFV       Full            Yes       Yes                                       +---------+---------------+---------+-----------+----------+-----------------+  SFJ       Full                                                                +---------+---------------+---------+-----------+----------+-----------------+  FV Prox   Full                                                                +---------+---------------+---------+-----------+----------+-----------------+  FV Mid    Full                                                                +---------+---------------+---------+-----------+----------+-----------------+  FV Distal Full                                                                +---------+---------------+---------+-----------+----------+-----------------+  PFV       Full                                                                +---------+---------------+---------+-----------+----------+-----------------+  POP       Full            Yes       Yes                                       +---------+---------------+---------+-----------+----------+-----------------+  PTV       Full                                                                 +---------+---------------+---------+-----------+----------+-----------------+  PERO      Partial                                          Age Indeterminate  +---------+---------------+---------+-----------+----------+-----------------+     Summary: RIGHT: - There is no evidence of deep vein thrombosis in the lower extremity.  - No cystic structure found in the popliteal fossa.  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 - No cystic structure found in the popliteal fossa.  *See table(s) above for measurements and observations. Electronically signed by Orlie Pollen on 12/06/2021 at 6:50:45 AM.    Final    ECHOCARDIOGRAM LIMITED  Result Date: 12/05/2021    ECHOCARDIOGRAM LIMITED REPORT   Patient Name:   ROLLIE HYNEK Date of Exam: 12/05/2021 Medical Rec #:  [id number]       Height:       70.0 in Accession #:    [id number]      Weight:       180.1 lb Date of Birth:  [birth year]       BSA:          1.996 m Patient Age:    66 years        BP:           144/89 mmHg Patient Gender: M               HR:           83 bpm. Exam Location:  Inpatient Procedure: Limited Echo, Limited Color Doppler and Cardiac Doppler Indications:    stroke  History:        Patient has prior history of Echocardiogram examinations, most                 recent 09/17/2021. Cancer; Risk Factors:Diabetes and                 Hypertension.  Sonographer:    Johny Chess RDCS Referring Phys: [place]  1. LV function decreased since 09/17/21 no strain imaging performed . Left ventricular ejection fraction, by estimation, is 50 to 55%. The left ventricle has low normal function. The left ventricle has no regional wall motion abnormalities. The left ventricular internal cavity size was mildly dilated. There is mild left ventricular hypertrophy. Left ventricular diastolic parameters are consistent with Grade I diastolic  dysfunction (impaired relaxation).  2. Right ventricular systolic function is normal. The right ventricular size is normal.  3. The mitral valve is abnormal. Trivial mitral valve regurgitation. No evidence  of mitral stenosis.  4. The aortic valve is tricuspid. Aortic valve regurgitation is not visualized. No aortic stenosis is present.  5. Aortic dilatation noted. There is mild dilatation of the ascending aorta, measuring 39 mm.  6. The inferior vena cava is normal in size with greater than 50% respiratory variability, suggesting right atrial pressure of 3 mmHg. FINDINGS  Left Ventricle: LV function decreased since 09/17/21 no strain imaging performed. Left ventricular ejection fraction, by estimation, is 50 to 55%. The left ventricle has low normal function. The left ventricle has no regional wall motion abnormalities. The left ventricular internal cavity size was mildly dilated. There is mild left ventricular hypertrophy. Left ventricular diastolic parameters are consistent with Grade I diastolic dysfunction (impaired relaxation). Right Ventricle: The right ventricular size is normal. No increase in right ventricular wall thickness. Right ventricular systolic function is normal. Left Atrium: Left atrial size was normal in size. Right Atrium: Right atrial size was normal in size. Pericardium: There is no evidence of pericardial effusion. Mitral Valve: The mitral valve is abnormal. There is mild thickening of the mitral valve leaflet(s). Trivial mitral valve regurgitation. No evidence of mitral valve stenosis. Tricuspid Valve: The tricuspid valve is normal in structure. Tricuspid valve regurgitation is mild . No evidence of tricuspid stenosis. Aortic Valve: The aortic valve is tricuspid. Aortic valve regurgitation is not visualized. No aortic stenosis is present. Pulmonic Valve: The pulmonic valve was normal in structure. Pulmonic valve regurgitation is not visualized. No evidence of pulmonic stenosis. Aorta: Aortic  dilatation noted. There is mild dilatation of the ascending aorta, measuring 39 mm. Venous: The inferior vena cava is normal in size with greater than 50% respiratory variability, suggesting right atrial pressure of 3 mmHg. IAS/Shunts: No atrial level shunt detected by color flow Doppler. LEFT VENTRICLE PLAX 2D LVIDd:         4.40 cm     Diastology LVIDs:         3.30 cm     LV e' medial:    3.92 cm/s LV PW:         1.00 cm     LV E/e' medial:  13.8 LV IVS:        1.30 cm     LV e' lateral:   6.74 cm/s                            LV E/e' lateral: 8.0  LV Volumes (MOD) LV vol d, MOD A2C: 75.9 ml LV vol s, MOD A2C: 47.1 ml LV SV MOD A2C:     28.8 ml IVC IVC diam: 1.40 cm LEFT ATRIUM         Index LA diam:    3.30 cm 1.65 cm/m  AORTIC VALVE LVOT Vmax:   81.10 cm/s LVOT Vmean:  55.700 cm/s LVOT VTI:    0.130 m  AORTA Ao Asc diam: 3.90 cm MITRAL VALVE MV Area (PHT): 2.96 cm    SHUNTS MV Decel Time: 256 msec    Systemic VTI: 0.13 m MV E velocity: 54.00 cm/s MV A velocity: 85.30 cm/s MV E/A ratio:  0.63 Jenkins Rouge MD Electronically signed by Jenkins Rouge MD Signature Date/Time: 12/05/2021/12:01:58 PM    Final    CT ANGIO HEAD NECK W WO CM W PERF (CODE STROKE)  Result Date: 12/04/2021 CLINICAL DATA:  Acute neuro deficit. Left-sided weakness. History of metastatic disease. EXAM: CT ANGIOGRAPHY HEAD AND NECK CT PERFUSION BRAIN TECHNIQUE: Multidetector  CT imaging of the head and neck was performed using the standard protocol during bolus administration of intravenous contrast. Multiplanar CT image reconstructions and MIPs were obtained to evaluate the vascular anatomy. Carotid stenosis measurements (when applicable) are obtained utilizing NASCET criteria, using the distal internal carotid diameter as the denominator. Multiphase CT imaging of the brain was performed following IV bolus contrast injection. Subsequent parametric perfusion maps were calculated using RAPID software. RADIATION DOSE REDUCTION: This exam was  performed according to the departmental dose-optimization program which includes automated exposure control, adjustment of the mA and/or kV according to patient size and/or use of iterative reconstruction technique. CONTRAST:  142mL OMNIPAQUE IOHEXOL 350 MG/ML SOLN COMPARISON:  CT head 11/18/2021 FINDINGS: CT HEAD FINDINGS Brain: Generalized atrophy. Chronic microvascular ischemic change throughout the white matter. Chronic infarct right thalamus. Interval contraction of hypodensity in the right cerebellum compatible with resolving infarct. Chronic infarct left superior cerebellum. Hypodensity in the central right cerebellum has progressed from prior studies and may be recent extension of a previously noted infarct. This measures 1 cm. Chronic infarct right superior cerebellum unchanged Negative for hemorrhage or mass. Vascular: Negative for hyperdense vessel Skull: Negative Sinuses: Paranasal sinuses clear Orbits: Bilateral cataract extraction. ASPECTS ([place] Stroke Program Early CT Score) - Ganglionic level infarction (caudate, lentiform nuclei, internal capsule, insula, M1-M3 cortex): 7 - Supraganglionic infarction (M4-M6 cortex): 3 Total score (0-10 with 10 being normal): 10 Review of the MIP images confirms the above findings CTA NECK FINDINGS Aortic arch: Normal aortic arch. Proximal great vessels widely patent. Bovine branching arch. Right carotid system: Right carotid widely patent without stenosis or atherosclerotic disease. Left carotid system: Left carotid widely patent. Negative for atherosclerotic disease or stenosis Vertebral arteries: Both vertebral arteries widely patent to the skull base. No significant stenosis. Skeleton: Degenerative changes in the cervical spine. No acute skeletal abnormality. Other neck: Negative for mass or edema in the neck. Port-A-Cath tip right jugular vein. Upper chest: 8.5 mm spiculated nodule right upper lobe unchanged from PET-CT 10/10/2021. No other lung nodule in the  apices. Review of the MIP images confirms the above findings CTA HEAD FINDINGS Anterior circulation: Cavernous carotid widely patent bilaterally. Anterior and middle cerebral arteries widely patent bilaterally. No stenosis or vascular malformation. Posterior circulation: Both vertebral arteries patent to the basilar. Basilar widely patent. PICA patent bilaterally. AICA, superior cerebellar, posterior cerebral arteries patent without stenosis or large vessel occlusion. Negative for cerebrovascular malformation. Venous sinuses: Normal venous enhancement Anatomic variants: None Review of the MIP images confirms the above findings CT Brain Perfusion Findings: ASPECTS: 10 CBF (<30%) Volume: 52mL Perfusion (Tmax>6.0s) volume: 1mL Mismatch Volume: 41mL Infarction Location:None IMPRESSION: 1. CT perfusion negative for acute infarct or ischemia 2. Negative for intracranial large vessel occlusion 3. No carotid or vertebral artery stenosis in the neck. No significant atherosclerotic disease. 4. CT head demonstrates new hypodensity right mid cerebellum. This could represent interval acute infarct or possibly metastatic disease given history. Follow-up MRI brain without and with contrast recommended. Additional chronic ischemic changes stable from prior studies. 5. 8.5 mm spiculated nodule right upper lobe, stable from prior PET-CT 10/10/2021 Electronically Signed   By: Franchot Gallo M.D.   On: 12/04/2021 14:08     Future Appointments  Date Time Provider [place]  12/12/2021  1:15 PM June Leap, PT [facility]  12/14/2021 10:30 AM [facility] PHLEBOTOMIST [facility] None  12/14/2021 10:45 AM [facility] FLUSH ROOM [facility] None  12/14/2021 11:40 AM [name], MD [facility] None  12/14/2021  12:00 PM [facility] CHAIR 1 [facility] None  12/16/2021  1:00 PM [facility] FLUSH ROOM [facility] None  12/19/2021 11:45 AM Janene Harvey D, PT [facility]  12/26/2021 10:15 AM Janene Harvey D, PT [facility]  [facility]  12/28/2021  8:45 AM [facility] PHLEBOTOMIST [facility] None  12/28/2021  9:00 AM [facility] FLUSH ROOM [facility] None  12/28/2021  9:30 AM [name], MD [facility] None  12/28/2021 10:45 AM [facility] CHAIR 2 [facility] None  12/28/2021 11:15 AM Karie Mainland, RD [facility] None  12/30/2021  8:30 AM Marcial Pacas, MD [facility] None  12/30/2021  1:30 PM [facility] FLUSH ROOM [facility] None  03/31/2022 10:30 AM [facility] PHARMACIST [facility]      LOS: 0 days

## 2021-12-06 NOTE — Progress Notes (Signed)
ANTICOAGULATION CONSULT NOTE  Pharmacy Consult for heparin Indication: stroke  No Known Allergies  Patient Measurements: Height: _0  (177.8 cm) Weight: 81.7 kg (180 lb 1.9 oz) IBW/kg (Calculated) : 73 Heparin Dosing Weight: 80kg  Vital Signs: Temp: 98.3 F (36.8 C) (01/24 2003) Temp Source: Oral (01/24 2003) BP: 131/92 (01/24 2003) Pulse Rate: 89 (01/24 2003)  Labs: Recent Labs    12/04/21 1309 12/04/21 1340 12/04/21 2257 12/05/21 0425 12/06/21 0040 12/06/21 0404 12/06/21 1002 12/06/21 2205  HGB 11.3* 12.2*  --  11.2*  --  10.9*  --   --   HCT 33.8* 36.0*  --  32.7*  --  31.4*  --   --   PLT 75*  --   --  56*  --  56*  --   --   APTT 28  --   --   --   --   --   --   --   LABPROT 14.7  --   --   --   --   --   --   --   INR 1.2  --   --   --   --   --   --   --   HEPARINUNFRC  --   --   --   --  0.44  --  0.60 0.50  CREATININE 2.07*  --  1.54* 1.42*  --  1.07  --   --      Estimated Creatinine Clearance: 55 mL/min (by C-G formula based on SCr of 1.07 mg/dL).   Medical History: Past Medical History:  Diagnosis Date   Arthritis    Cancer ([place])    Cataract    Diabetes mellitus    Hyperlipidemia    Stroke ([place])     Medications:  Medications Prior to Admission  Medication Sig Dispense Refill Last Dose   acetaminophen (TYLENOL) 650 MG CR tablet Take 650 mg by mouth every 8 (eight) hours as needed for pain.   Past Week   atorvastatin (LIPITOR) 40 MG tablet Take 1 tablet (40 mg total) by mouth daily. 90 tablet 1 12/04/2021   Cholecalciferol (VITAMIN D3) 2000 UNITS TABS Take 2,000 Units by mouth daily.   12/04/2021   lidocaine-prilocaine (EMLA) cream Apply 1 application topically as needed. Apply 1/2 tablespoon to port site 2 hours prior to stick and cover with Press-and-Seal to numb site for access (Patient taking differently: Apply 1 application topically daily as needed (port site access).) 30 g 1 Past Month   metFORMIN (GLUCOPHAGE-XR) 750 MG 24 hr tablet  TAKE 1 TABLET BY MOUTH EVERY DAY WITH BREAKFAST (Patient taking differently: Take 750 mg by mouth daily with breakfast.) 90 tablet 0 12/04/2021   ondansetron (ZOFRAN) 8 MG tablet Take 1 tablet (8 mg total) by mouth every 8 (eight) hours as needed for nausea or vomiting. Start 72 hours after each IV chemotherapy administration 30 tablet 1 Past Month   prochlorperazine (COMPAZINE) 10 MG tablet Take 1 tablet (10 mg total) by mouth every 6 (six) hours as needed for nausea. 60 tablet 1 Past Month   silodosin (RAPAFLO) 8 MG CAPS capsule Take 8 mg by mouth at bedtime.   Past Week   traMADol (ULTRAM) 50 MG tablet Take 1 tablet (50 mg total) by mouth every 6 (six) hours as needed for moderate pain. 60 tablet 0 Past Week   TRAVATAN Z 0.004 % SOLN ophthalmic solution Place 1 drop into both eyes at bedtime.   Past  Week   Accu-Chek Softclix Lancets lancets Use to test blood sugar levels once a day. 100 each 12    Blood Glucose Monitoring Suppl (ACCU-CHEK AVIVA PLUS) w/Device KIT Use to check blood sugar once a day 1 kit 0    clopidogrel (PLAVIX) 75 MG tablet Take 75 mg by mouth daily.   12/03/2021   Continuous Blood Gluc Receiver (DEXCOM G6 RECEIVER) DEVI Use to check blood sugar levels as directed. 1 each 0    Continuous Blood Gluc Sensor (DEXCOM G6 SENSOR) MISC Use to check blood glucose levels as directed. 3 each 1    glucose blood test strip Use as instructed 100 each 12    insulin glargine, 2 Unit Dial, (TOUJEO MAX SOLOSTAR) 300 UNIT/ML Solostar Pen Inject 10 Units into the skin daily. (Patient taking differently: Inject 20 Units into the skin daily.) 3 mL 1 12/03/2021   insulin lispro (HUMALOG KWIKPEN) 200 UNIT/ML KwikPen Give 5 units [place] twice daily- with breakfast and supper. (Patient not taking: Reported on 12/05/2021) 3 mL 2 Not Taking   pantoprazole (PROTONIX) 20 MG tablet TAKE 1 TABLET(20 MG) BY MOUTH DAILY (Patient not taking: Reported on 12/05/2021) 90 tablet 0 Not Taking   Scheduled:   atorvastatin   40 mg Oral Daily   Chlorhexidine Gluconate Cloth  6 each Topical Daily   insulin aspart  0-15 Units Subcutaneous TID WC   insulin glargine-yfgn  15 Units Subcutaneous Daily   Infusions:   cefTRIAXone (ROCEPHIN)  IV 2 g (12/06/21 1341)   heparin 1,150 Units/hr (12/06/21 1219)    Assessment: Pt who presented with dizziness was found to have a new CVA. He has a hx of metastatic adenocarcinoma. Plan is to do heparin bridging before going to PO anticoagulation. Pt was on Plavix prior to admission.   PM update - Heparin level came back slightly therapeutic at top of range at 0.5. Hg down to 10.9 and plt stable at 56. No bleed issues reported.  Goal of Therapy:  Heparin level 0.3-0.5 units/ml Monitor platelets by anticoagulation protocol: Yes   Plan:  Decrease heparin slightly to 1100 units/hr to ensure stays in range Check confirmatory heparin level with AM labs Monitor daily CBC, s/sx bleeding   Arturo Morton, PharmD, BCPS Please check AMION for all [place] contact numbers Clinical Pharmacist 12/06/2021 10:46 PM

## 2021-12-06 NOTE — Progress Notes (Signed)
ANTICOAGULATION CONSULT NOTE - Follow Up Consult  Pharmacy Consult for heparin Indication: DVT in setting of acute CVA w/ mild hemorrhagic transformation  Labs: Recent Labs    12/04/21 1309 12/04/21 1340 12/04/21 2257 12/05/21 0425 12/06/21 0040  HGB 11.3* 12.2*  --  11.2*  --   HCT 33.8* 36.0*  --  32.7*  --   PLT 75*  --   --  56*  --   APTT 28  --   --   --   --   LABPROT 14.7  --   --   --   --   INR 1.2  --   --   --   --   HEPARINUNFRC  --   --   --   --  0.44  CREATININE 2.07*  --  1.54* 1.42*  --     Assessment/Plan:  81yo male therapeutic on heparin with initial dosing for DVT in setting of acute CVA. Will continue infusion at current rate of 1200 units/hr and confirm stable with additional level.   Wynona Neat, PharmD, BCPS  12/06/2021,1:58 AM

## 2021-12-06 NOTE — Plan of Care (Signed)

## 2021-12-06 NOTE — TOC Initial Note (Signed)
Transition of Care [facility]) - Initial/Assessment Note    Patient Details  Name: Robert Warner MRN: [id number] Date of Birth: [birth year]  [facility] CM/SW Contact:    Cyndi Bender, RN Phone Number: 12/06/2021, 3:26 PM  Clinical Narrative:                 Spoke to patient and wife regarding transition needs. Orders for Home health PT/OT/SLP. Choice offered. Wife choose [facility]. Spoke to [place] with [place] and referral accepted for HH-PT/OT/SLP. Patient has a walker and cane. Patient lives with wife and she can transport patient once discharged. Patient is new to [facility] card given. Address, Phone number and PCP verified.    Expected Discharge Plan: [place] Barriers to Discharge: Continued Medical Work up   Patient Goals and CMS Choice Patient states their goals for this hospitalization and ongoing recovery are:: return home CMS Medicare.gov Compare Post Acute Care list provided to:: Patient Represenative (must comment) Choice offered to / list presented to : Spouse  Expected Discharge Plan and Services Expected Discharge Plan: [place]   Discharge Planning Services: CM Consult Post Acute Care Choice: [place] arrangements for the past 2 months: Single Family Home                           HH Arranged: PT, OT, Speech Therapy HH Agency: [place] Date [facility] Agency Contacted: 12/06/21 Time HH Agency Contacted: 60 Representative spoke with at [place]: Tommi Rumps  Prior Living Arrangements/Services Living arrangements for the past 2 months: [place] Lives with:: Spouse Patient language and need for interpreter reviewed:: Yes Do you feel safe going back to the place where you live?: Yes      Need for Family Participation in Patient Care: Yes (Comment) Care giver support system in place?: Yes (comment) Current home services: DME (walker and cane) Criminal Activity/Legal Involvement  Pertinent to Current Situation/Hospitalization: No - Comment as needed  Activities of Daily Living Home Assistive Devices/Equipment: None ADL Screening (condition at time of admission) Patient's cognitive ability adequate to safely complete daily activities?: Yes Is the patient deaf or have difficulty hearing?: No Does the patient have difficulty seeing, even when wearing glasses/contacts?: No Does the patient have difficulty concentrating, remembering, or making decisions?: No Patient able to express need for assistance with ADLs?: Yes Does the patient have difficulty dressing or bathing?: Yes Independently performs ADLs?: No Communication: Independent Dressing (OT): Needs assistance Is this a change from baseline?: Change from baseline, expected to last <3days Grooming: Needs assistance Is this a change from baseline?: Change from baseline, expected to last <3 days Feeding: Needs assistance Is this a change from baseline?: Change from baseline, expected to last <3 days Bathing: Needs assistance Is this a change from baseline?: Change from baseline, expected to last <3 days Toileting: Needs assistance Is this a change from baseline?: Change from baseline, expected to last >3days In/Out Bed: Dependent Is this a change from baseline?: Change from baseline, expected to last <3 days Walks in Home: Dependent Is this a change from baseline?: Change from baseline, expected to last <3 days Does the patient have difficulty walking or climbing stairs?: Yes Weakness of Legs: Both Weakness of Arms/Hands: Both  Permission Sought/Granted Permission sought to share information with : Investment banker, corporate granted to share info w AGENCY: home health  Emotional Assessment Appearance:: Appears stated age Attitude/Demeanor/Rapport: Engaged Affect (typically observed): Accepting Orientation: : Oriented to Self, Oriented to Place, Oriented to  Time, Oriented to  Situation Alcohol / Substance Use: Not Applicable Psych Involvement: No (comment)  Admission diagnosis:  CVA (cerebral vascular accident) [facility]) [I63.9] Cerebrovascular accident (CVA), unspecified mechanism ([place]) [I63.9] Patient Active Problem List   Diagnosis Date Noted   CVA (cerebral vascular accident) ([place]) 12/06/2021   AKI (acute kidney injury) ([place]) 12/04/2021   Hyponatremia 12/04/2021   Cancer with unknown primary site [facility]) 10/11/2021   Goals of care, counseling/discussion 10/11/2021   Chest pain 09/21/2021   Recent cerebrovascular accident (CVA) 09/21/2021   Interatrial cardiac shunt 82/50/5397   Acute embolic stroke ([place]) 67/34/1937   Mixed diabetic hyperlipidemia associated with type 2 diabetes mellitus ([place]) 09/16/2021   Liver masses 09/16/2021   Essential hypertension 03/12/2012   Type 2 diabetes mellitus with hyperglycemia ([place]) 06/15/2008   BPH (benign prostatic hyperplasia) 06/15/2008   PCP:  [name], MD Pharmacy:   [facility] [place], [address] AT [address] [place] [address] Phone: [phone] Fax: [phone]     Social Determinants of Health (SDOH) Interventions    Readmission Risk Interventions No flowsheet data found.

## 2021-12-06 NOTE — Progress Notes (Addendum)
Physical Therapy Treatment Patient Details Name: Robert Warner MRN: [id number] DOB: 30-Nov-1940 Today's Date: 12/06/2021   History of Present Illness 81 y.o. M admitted 1/22 with imbalance and LUE weakness. MRI showing progression of small infarcts scattered in the bil cerebral and cerebellar hemispheres since November. PMHx: CA, HLD, DM2, prior embolic CVA.    PT Comments    Pt pleasant with increased gait and activity tolerance this session. Pt with spouse present to confirm available assist at D/C and pt reports improved strength of LUE since admission. Pt educated for repeated sit to stands for strengthening and balance as well as increased awareness of environment with gait given his tendency to run into objects on left. D/C plan remains appropriate.     Recommendations for follow up therapy are one component of a multi-disciplinary discharge planning process, led by the attending physician.  Recommendations may be updated based on patient status, additional functional criteria and insurance authorization.  Follow Up Recommendations  Home health PT     Assistance Recommended at Discharge Intermittent Supervision/Assistance  Patient can return home with the following A little help with walking and/or transfers;A little help with bathing/dressing/bathroom;Assistance with cooking/housework;Direct supervision/assist for medications management;Help with stairs or ramp for entrance   Equipment Recommendations  None recommended by PT    Recommendations for Other Services       Precautions / Restrictions Precautions Precautions: Fall     Mobility  Bed Mobility               General bed mobility comments: in chair on arrival and end of session    Transfers Overall transfer level: Needs assistance   Transfers: Sit to/from Stand Sit to Stand: Min guard           General transfer comment: pt able to perform 5 STS with reliance on bil UE support in 30 sec with cues for  controlled descent and increaed effort to achieve fully upright posture    Ambulation/Gait Ambulation/Gait assistance: Min guard Gait Distance (Feet): 400 Feet Assistive device: Rolling walker (2 wheels) Gait Pattern/deviations: Step-through pattern, Decreased stride length, Trunk flexed   Gait velocity interpretation: 1.31 - 2.62 ft/sec, indicative of limited community ambulator   General Gait Details: cue for posture, pt running into objects on left x 3 with cues to attend to environment with pt able to change direction without physical assist and recall room number   Stairs Stairs:  (pt declined attempting today)           Wheelchair Mobility    Modified Rankin (Stroke Patients Only) Modified Rankin (Stroke Patients Only) Pre-Morbid Rankin Score: Moderate disability Modified Rankin: Moderately severe disability     Balance Overall balance assessment: Needs assistance   Sitting balance-Leahy Scale: Good     Standing balance support: Bilateral upper extremity supported Standing balance-Leahy Scale: Poor Standing balance comment: RW for gait                            Cognition Arousal/Alertness: Awake/alert Behavior During Therapy: WFL for tasks assessed/performed Overall Cognitive Status: History of cognitive impairments - at baseline                                          Exercises General Exercises - Lower Extremity Long Arc Quad: AROM, Both, Seated, 15 reps Hip Flexion/Marching: AROM, Both,  Seated, 15 reps    General Comments        Pertinent Vitals/Pain Pain Assessment Pain Assessment: 0-10 Pain Score: 5  Pain Location: left hip with gait Pain Descriptors / Indicators: Aching Pain Intervention(s): Limited activity within patient's tolerance, Monitored during session, Repositioned, Patient requesting pain meds-RN notified    Home Living                          Prior Function            PT Goals  (current goals can now be found in the care plan section) Progress towards PT goals: Progressing toward goals    Frequency    Min 3X/week      PT Plan Current plan remains appropriate    Co-evaluation              AM-PAC PT "6 Clicks" Mobility   Outcome Measure  Help needed turning from your back to your side while in a flat bed without using bedrails?: None Help needed moving from lying on your back to sitting on the side of a flat bed without using bedrails?: A Little Help needed moving to and from a bed to a chair (including a wheelchair)?: A Little Help needed standing up from a chair using your arms (e.g., wheelchair or bedside chair)?: A Little Help needed to walk in hospital room?: A Little Help needed climbing 3-5 steps with a railing? : A Little 6 Click Score: 19    End of Session   Activity Tolerance: Patient tolerated treatment well Patient left: in chair;with call bell/phone within reach;with family/visitor present Nurse Communication: Mobility status PT Visit Diagnosis: Other abnormalities of gait and mobility (R26.89);Unsteadiness on feet (R26.81)     Time: 5537-4827 PT Time Calculation (min) (ACUTE ONLY): 25 min  Charges:  $Gait Training: 8-22 mins $Therapeutic Exercise: 8-22 mins                     Tykee Heideman P, PT Acute Rehabilitation Services Pager: [phone] Office: [place] 12/06/2021, 11:10 AM

## 2021-12-06 NOTE — Progress Notes (Signed)
ANTICOAGULATION CONSULT NOTE - Initial Consult  Pharmacy Consult for heparin Indication: stroke  No Known Allergies  Patient Measurements: Height: 5' 10"  (177.8 cm) Weight: 81.7 kg (180 lb 1.9 oz) IBW/kg (Calculated) : 73 Heparin Dosing Weight: 80kg  Vital Signs: Temp: 98.4 F (36.9 C) (01/24 0753) Temp Source: Oral (01/24 0753) BP: 130/87 (01/24 0753) Pulse Rate: 81 (01/24 0753)  Labs: Recent Labs    12/04/21 1309 12/04/21 1340 12/04/21 2257 12/05/21 0425 12/06/21 0040 12/06/21 0404 12/06/21 1002  HGB 11.3* 12.2*  --  11.2*  --  10.9*  --   HCT 33.8* 36.0*  --  32.7*  --  31.4*  --   PLT 75*  --   --  56*  --  56*  --   APTT 28  --   --   --   --   --   --   LABPROT 14.7  --   --   --   --   --   --   INR 1.2  --   --   --   --   --   --   HEPARINUNFRC  --   --   --   --  0.44  --  0.60  CREATININE 2.07*  --  1.54* 1.42*  --  1.07  --      Estimated Creatinine Clearance: 55 mL/min (by C-G formula based on SCr of 1.07 mg/dL).   Medical History: Past Medical History:  Diagnosis Date   Arthritis    Cancer ([place])    Cataract    Diabetes mellitus    Hyperlipidemia    Stroke ([place])     Medications:  Medications Prior to Admission  Medication Sig Dispense Refill Last Dose   acetaminophen (TYLENOL) 650 MG CR tablet Take 650 mg by mouth every 8 (eight) hours as needed for pain.   Past Week   atorvastatin (LIPITOR) 40 MG tablet Take 1 tablet (40 mg total) by mouth daily. 90 tablet 1 12/04/2021   Cholecalciferol (VITAMIN D3) 2000 UNITS TABS Take 2,000 Units by mouth daily.   12/04/2021   lidocaine-prilocaine (EMLA) cream Apply 1 application topically as needed. Apply 1/2 tablespoon to port site 2 hours prior to stick and cover with Press-and-Seal to numb site for access (Patient taking differently: Apply 1 application topically daily as needed (port site access).) 30 g 1 Past Month   metFORMIN (GLUCOPHAGE-XR) 750 MG 24 hr tablet TAKE 1 TABLET BY MOUTH EVERY DAY WITH  BREAKFAST (Patient taking differently: Take 750 mg by mouth daily with breakfast.) 90 tablet 0 12/04/2021   ondansetron (ZOFRAN) 8 MG tablet Take 1 tablet (8 mg total) by mouth every 8 (eight) hours as needed for nausea or vomiting. Start 72 hours after each IV chemotherapy administration 30 tablet 1 Past Month   prochlorperazine (COMPAZINE) 10 MG tablet Take 1 tablet (10 mg total) by mouth every 6 (six) hours as needed for nausea. 60 tablet 1 Past Month   silodosin (RAPAFLO) 8 MG CAPS capsule Take 8 mg by mouth at bedtime.   Past Week   traMADol (ULTRAM) 50 MG tablet Take 1 tablet (50 mg total) by mouth every 6 (six) hours as needed for moderate pain. 60 tablet 0 Past Week   TRAVATAN Z 0.004 % SOLN ophthalmic solution Place 1 drop into both eyes at bedtime.   Past Week   Accu-Chek Softclix Lancets lancets Use to test blood sugar levels once a day. 100 each 12  Blood Glucose Monitoring Suppl (ACCU-CHEK AVIVA PLUS) w/Device KIT Use to check blood sugar once a day 1 kit 0    clopidogrel (PLAVIX) 75 MG tablet Take 75 mg by mouth daily.   12/03/2021   Continuous Blood Gluc Receiver (DEXCOM G6 RECEIVER) DEVI Use to check blood sugar levels as directed. 1 each 0    Continuous Blood Gluc Sensor (DEXCOM G6 SENSOR) MISC Use to check blood glucose levels as directed. 3 each 1    glucose blood test strip Use as instructed 100 each 12    insulin glargine, 2 Unit Dial, (TOUJEO MAX SOLOSTAR) 300 UNIT/ML Solostar Pen Inject 10 Units into the skin daily. (Patient taking differently: Inject 20 Units into the skin daily.) 3 mL 1 12/03/2021   insulin lispro (HUMALOG KWIKPEN) 200 UNIT/ML KwikPen Give 5 units [place] twice daily- with breakfast and supper. (Patient not taking: Reported on 12/05/2021) 3 mL 2 Not Taking   pantoprazole (PROTONIX) 20 MG tablet TAKE 1 TABLET(20 MG) BY MOUTH DAILY (Patient not taking: Reported on 12/05/2021) 90 tablet 0 Not Taking   Scheduled:   atorvastatin  40 mg Oral Daily   Chlorhexidine  Gluconate Cloth  6 each Topical Daily   furosemide  20 mg Oral Once   insulin aspart  0-15 Units Subcutaneous TID WC   insulin glargine-yfgn  15 Units Subcutaneous Daily   Infusions:   cefTRIAXone (ROCEPHIN)  IV 2 g (12/05/21 1353)   heparin 1,200 Units/hr (12/05/21 1640)    Assessment: Pt who presented with dizziness was found to have a new CVA. He has a hx of metastatic adenocarcinoma. Plan is to do heparin bridging before going to PO anticoagulation. Pt was on Plavix prior to admission.   Repeat heparin level came back slightly supratherapeutic. We will reduce rate and check another level.   Goal of Therapy:  Heparin level 0.3-0.5 units/ml Monitor platelets by anticoagulation protocol: Yes   Plan:  Decrease heparin to 1150 units/hr Check 8 hr HL  Daily HL and CBC F/u PO AC  Onnie Boer, PharmD, BCIDP, AAHIVP, CPP Infectious Disease Pharmacist 12/06/2021 11:30 AM

## 2021-12-07 DIAGNOSIS — I639 Cerebral infarction, unspecified: Secondary | ICD-10-CM | POA: Diagnosis not present

## 2021-12-07 DIAGNOSIS — R7881 Bacteremia: Secondary | ICD-10-CM

## 2021-12-07 DIAGNOSIS — B961 Klebsiella pneumoniae [K. pneumoniae] as the cause of diseases classified elsewhere: Secondary | ICD-10-CM

## 2021-12-07 DIAGNOSIS — I82409 Acute embolism and thrombosis of unspecified deep veins of unspecified lower extremity: Secondary | ICD-10-CM | POA: Diagnosis present

## 2021-12-07 LAB — CBC WITH DIFFERENTIAL/PLATELET
Abs Immature Granulocytes: 0.03 10*3/uL (ref 0.00–0.07)
Basophils Absolute: 0 10*3/uL (ref 0.0–0.1)
Basophils Relative: 0 %
Eosinophils Absolute: 0.1 10*3/uL (ref 0.0–0.5)
Eosinophils Relative: 2 %
HCT: 31.4 % — ABNORMAL LOW (ref 39.0–52.0)
Hemoglobin: 10.7 g/dL — ABNORMAL LOW (ref 13.0–17.0)
Immature Granulocytes: 1 %
Lymphocytes Relative: 26 %
Lymphs Abs: 1.4 10*3/uL (ref 0.7–4.0)
MCH: 28.7 pg (ref 26.0–34.0)
MCHC: 34.1 g/dL (ref 30.0–36.0)
MCV: 84.2 fL (ref 80.0–100.0)
Monocytes Absolute: 0.3 10*3/uL (ref 0.1–1.0)
Monocytes Relative: 6 %
Neutro Abs: 3.4 10*3/uL (ref 1.7–7.7)
Neutrophils Relative %: 65 %
Platelets: 60 10*3/uL — ABNORMAL LOW (ref 150–400)
RBC: 3.73 MIL/uL — ABNORMAL LOW (ref 4.22–5.81)
RDW: 14.9 % (ref 11.5–15.5)
WBC: 5.3 10*3/uL (ref 4.0–10.5)
nRBC: 0 % (ref 0.0–0.2)

## 2021-12-07 LAB — C-REACTIVE PROTEIN: CRP: 10.7 mg/dL — ABNORMAL HIGH (ref <1.0)

## 2021-12-07 LAB — COMPREHENSIVE METABOLIC PANEL
ALT: 28 U/L (ref 0–44)
AST: 58 U/L — ABNORMAL HIGH (ref 15–41)
Albumin: 2 g/dL — ABNORMAL LOW (ref 3.5–5.0)
Alkaline Phosphatase: 67 U/L (ref 38–126)
Anion gap: 5 (ref 5–15)
BUN: 14 mg/dL (ref 8–23)
CO2: 24 mmol/L (ref 22–32)
Calcium: 7.8 mg/dL — ABNORMAL LOW (ref 8.9–10.3)
Chloride: 98 mmol/L (ref 98–111)
Creatinine, Ser: 1.15 mg/dL (ref 0.61–1.24)
GFR, Estimated: >60 mL/min (ref >60)
Glucose, Bld: 156 mg/dL — ABNORMAL HIGH (ref 70–99)
Potassium: 3.8 mmol/L (ref 3.5–5.1)
Sodium: 127 mmol/L — ABNORMAL LOW (ref 135–145)
Total Bilirubin: 0.7 mg/dL (ref 0.3–1.2)
Total Protein: 4.7 g/dL — ABNORMAL LOW (ref 6.5–8.1)

## 2021-12-07 LAB — URINE CULTURE: Culture: NO GROWTH

## 2021-12-07 LAB — GLUCOSE, CAPILLARY
Glucose-Capillary: 123 mg/dL — ABNORMAL HIGH (ref 70–99)
Glucose-Capillary: 228 mg/dL — ABNORMAL HIGH (ref 70–99)
Glucose-Capillary: 246 mg/dL — ABNORMAL HIGH (ref 70–99)
Glucose-Capillary: 288 mg/dL — ABNORMAL HIGH (ref 70–99)

## 2021-12-07 LAB — BRAIN NATRIURETIC PEPTIDE: B Natriuretic Peptide: 80.9 pg/mL (ref 0.0–100.0)

## 2021-12-07 LAB — CULTURE, BLOOD (ROUTINE X 2): Special Requests: ADEQUATE

## 2021-12-07 LAB — PROCALCITONIN: Procalcitonin: 17.97 ng/mL

## 2021-12-07 LAB — MAGNESIUM: Magnesium: 1.7 mg/dL (ref 1.7–2.4)

## 2021-12-07 LAB — HEPARIN LEVEL (UNFRACTIONATED): Heparin Unfractionated: 0.57 IU/mL (ref 0.30–0.70)

## 2021-12-07 MED ORDER — TAMSULOSIN HCL 0.4 MG PO CAPS
0.4000 mg | ORAL_CAPSULE | Freq: Every day | ORAL | Status: DC
  Administered 2021-12-07 – 2021-12-09 (×4): 0.4 mg via ORAL
  Filled 2021-12-07 (×4): qty 1

## 2021-12-07 MED ORDER — POLYETHYLENE GLYCOL 3350 17 G PO PACK
34.0000 g | PACK | Freq: Two times a day (BID) | ORAL | Status: DC
  Administered 2021-12-07 – 2021-12-09 (×6): 34 g via ORAL
  Filled 2021-12-07 (×7): qty 2

## 2021-12-07 MED ORDER — CEFAZOLIN SODIUM-DEXTROSE 2-4 GM/100ML-% IV SOLN
2.0000 g | Freq: Three times a day (TID) | INTRAVENOUS | Status: DC
  Administered 2021-12-07 – 2021-12-09 (×8): 2 g via INTRAVENOUS
  Filled 2021-12-07 (×8): qty 100

## 2021-12-07 MED ORDER — MAGNESIUM HYDROXIDE 400 MG/5ML PO SUSP
30.0000 mL | Freq: Every day | ORAL | Status: DC | PRN
  Administered 2021-12-07 – 2021-12-09 (×2): 30 mL via ORAL
  Filled 2021-12-07 (×2): qty 30

## 2021-12-07 MED ORDER — BISACODYL 5 MG PO TBEC
10.0000 mg | DELAYED_RELEASE_TABLET | Freq: Once | ORAL | Status: AC
  Administered 2021-12-07: 12:00:00 10 mg via ORAL
  Filled 2021-12-07: qty 2

## 2021-12-07 NOTE — Progress Notes (Signed)
Inpatient Diabetes Program Recommendations  AACE/ADA: New Consensus Statement on Inpatient Glycemic Control (2015)  Target Ranges:  Prepandial:   less than 140 mg/dL      Peak postprandial:   less than 180 mg/dL (1-2 hours)      Critically ill patients:  140 - 180 mg/dL   Lab Results  Component Value Date   GLUCAP 246 (H) 12/07/2021   HGBA1C 11.9 (H) 12/04/2021   Spoke with patient and wife regarding outpatient diabetes management. Was recently seen by PCP and Humalog TID, Dexcom and increase to basal was ordered. Wife reports that insurance will not cover Humalog and the Dexcom. Assuming related to FOLFOX. Reviewed patient's current A1c of 11.9%. Explained what a A1c is and what it measures. Also reviewed goal A1c with patient, importance of good glucose control @ home, and blood sugar goals. Reviewed patho of DM, need for insulin, differences between short acting and long acting, interventions, signs and symptoms of hypo vs hyperglycemia, vascular changes, side effects of FOLFOX, and other commorbidities.  Patient has a meter, but was interested in [facility]. Reviewed application, benefits, risk, cost and how to obtain. Secure chat sent to MD.  Also reviewed [employer] products to include Regular novolin R and cost.  At discharge, please ensure patient receives prescriptions for Novolin R 5 units with breakfast and dinner.  No further questions at this time.   Thanks, Bronson Curb, MSN, RNC-OB Diabetes Coordinator [phone] (8a-5p)

## 2021-12-07 NOTE — Assessment & Plan Note (Signed)
1. Progression of embolic appearing small infarcts scattered in the bilateral cerebral and cerebellar hemispheres since November. Many such infarcts demonstrate petechial hemorrhage. Small acute infarcts are identified in the right cerebellum, left splenium and there are multiple subacute appearing punctate white matter infarcts - he has recent history of embolic infarcts in November 2022 with work-up positive for right-to-left shunt.  He was on Plavix since November 2022.  He was currently on Plavix and statin combination for secondary prevention, and had a breakthrough stroke again this admission, seen by neurology and stroke team, with his history of left to right shunt lower extremity venous duplex was ordered which was positive for acute DVT.  He is now on heparin drip for close monitoring in the light of his underlying acute strokes with mild petechial conversion.  If he stays stable for 2 to 3 days on heparin drip he can be transition to Eliquis.

## 2021-12-07 NOTE — Progress Notes (Addendum)
PROGRESS NOTE                                                                                                                                                                                                             Patient Demographics:    Robert Warner, is a 81 y.o. male, DOB - [birth year], TSV:[id number]  Outpatient Primary MD for the patient is [name], MD    LOS - 1  Admit date - 12/04/2021    CC -poor balance and difficulty in walking     Brief Narrative (HPI from H&P) -   Robert Warner is a 81 y.o. 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  He presented to the ER with dizziness and poor balance.  He was admitted for further stroke work-up.  's work-up was suggestive of embolic stroke in the presence of right-to-left shunt, new DVT and Klebsiella bacteremia.  CT scan now suggest possible underlying gallbladder malignancy.  GI, oncology and neurology have been consulted   Subjective:   Patient reports constipation, he had urinary retention earlier today,    Assessment  & Plan :    Progression of embolic appearing small infarcts scattered in the bilateral cerebral and cerebellar hemispheres since November. Many such infarcts demonstrate petechial hemorrhage. Small acute infarcts are identified in the right cerebellum, left splenium and there are multiple subacute appearing punctate white matter infarcts - he has recent history of embolic infarcts in November 2022 with work-up positive for right-to-left shunt.  He was on Plavix since November 2022.  He was currently on Plavix and statin combination for secondary prevention, and had a  breakthrough stroke again this admission, seen by neurology and stroke team, with his history of left to right shunt lower extremity venous duplex was ordered which was positive for acute DVT.  He is now on heparin drip for close monitoring in the light of his underlying acute strokes with mild petechial conversion.  If he stays stable for 2 to 3 days on heparin drip he can be transition to Eliquis.  2.  Acute on chronic  left hip pain fall at home.  Labile with supportive care CT hip unremarkable.  3.  Hx of metastatic cancer with unknown primary under the care of [name].  He finished his fourth cycle of FOLFOX on 11/30/2020.  CT scan done this admission now suggestive of possible gallbladder malignancy.  GI and [name] both consulted on 12/06/2021  4.  Klebsiella bacteremia incidental finding on 12/05/2020.  Initially on IV Rocephin, this has been narrowed to IV cefazolin, repeat blood cultures remain negative, antibiotics management per ID, as long cultures are negative will need total of 2 weeks treatment, IV cefazolin during hospital stay, then can be transitioned to oral on discharge, if repeat cultures are positive, then port need to be discontinued and she will need TEE . -ID will follow as an outpatient regarding repeat cultures once antibiotic treatment has finished .   5.  Known history of left shunt.  Patient is currently on anticoagulation.  6.  Hyponatremia.  Initially due to dehydration now getting worse on IV fluids, stop IV fluids check serum and urine osmolality along with urine sodium, gentle low-dose one-time oral Lasix on 12/06/2021 after urine sample is obtained.  7.  AKI.  Resolved after hydration with IV fluids.  8 .  Hypertension.  For permissive hypertension due to acute CVA.    9. DM type II.  Lantus and sliding scale.   Lab Results  Component Value Date   HGBA1C 11.9 (H) 12/04/2021   CBG (last 3)  Recent Labs    12/06/21 2033 12/07/21 0835  12/07/21 1156  GLUCAP 232* 123* 228*         Condition - Extremely Guarded  Family Communication  :  wife Robert Warner [phone] on 12/05/21, 12/06/2021  Code Status :  Full  Consults  :  Neuro, GI, oncology  PUD Prophylaxis :     Procedures  :     MRI - 1. No contrast administered, limiting evaluation for metastatic disease. 2. Progression of embolic appearing small infarcts scattered in the bilateral cerebral and cerebellar hemispheres since November. Many such infarcts demonstrate petechial hemorrhage (including confluent new petechial hemorrhage in the left cerebellum), but no malignant hemorrhagic transformation or intracranial mass effect. Small acute infarcts are identified in the right cerebellum, left splenium. And there are multiple subacute appearing punctate white matter infarcts. 3. Underlying advanced but nonspecific signal changes in the cerebral white matter and pons possibly due to chronic small vessel disease.  CTA - 1. CT perfusion negative for acute infarct or ischemia 2. Negative for intracranial large vessel occlusion 3. No carotid or vertebral artery stenosis in the neck. No significant atherosclerotic disease. 4. CT head demonstrates new hypodensity right mid cerebellum. This could represent interval acute infarct or possibly metastatic disease given history. Follow-up MRI brain without and with contrast recommended. Additional chronic ischemic changes stable from prior studies. 5. 8.5 mm spiculated nodule right upper lobe, stable from prior PET-CT 10/10/2021       Disposition Plan  :    Status is: Inpatient    DVT Prophylaxis  :  Hep gtt    Lab Results  Component Value Date   PLT 60 (L) 12/07/2021    Diet :  Diet Order             Diet Carb Modified Fluid consistency: Thin; Room service appropriate? Yes  Diet effective now  Inpatient Medications  Scheduled Meds:  atorvastatin  40 mg Oral Daily   Chlorhexidine Gluconate  Cloth  6 each Topical Daily   insulin aspart  0-15 Units Subcutaneous TID WC   insulin glargine-yfgn  15 Units Subcutaneous Daily   polyethylene glycol  34 g Oral BID   tamsulosin  0.4 mg Oral QPC supper   Continuous Infusions:   ceFAZolin (ANCEF) IV 2 g (12/07/21 1523)   heparin 1,050 Units/hr (12/07/21 0841)   PRN Meds:.acetaminophen **OR** acetaminophen (TYLENOL) oral liquid 160 mg/5 mL **OR** acetaminophen, magnesium hydroxide, sodium chloride flush  Antibiotics  :    Anti-infectives (From admission, onward)    Start     Dose/Rate Route Frequency Ordered Stop   12/07/21 1400  ceFAZolin (ANCEF) IVPB 2g/100 mL premix        2 g 200 mL/hr over 30 Minutes Intravenous Every 8 hours 12/07/21 0956     12/05/21 1430  cefTRIAXone (ROCEPHIN) 2 g in sodium chloride 0.9 % 100 mL IVPB  Status:  Discontinued        2 g 200 mL/hr over 30 Minutes Intravenous Every 24 hours 12/05/21 1339 12/07/21 0956          Emeline Gins Johnross Nabozny M.D on 12/07/2021 at 3:31 PM  To page go to [email or web address]   [facility] -  Office  [phone]  See all Orders from today for further details    Objective:   Vitals:   12/06/21 2352 12/07/21 0319 12/07/21 0730 12/07/21 1150  BP: (!) 126/98 133/84 (!) 142/97 (!) 138/91  Pulse: 78 75 74 86  Resp: 19 15 17 16   Temp: 98.4 F (36.9 C) 98.4 F (36.9 C)  97.6 F (36.4 C)  TempSrc: Oral Oral Oral Oral  SpO2: 98% 99% 97% 97%  Weight:      Height:        Wt Readings from Last 3 Encounters:  12/04/21 81.7 kg  12/02/21 81.4 kg  11/30/21 79.9 kg     Intake/Output Summary (Last 24 hours) at 12/07/2021 1531 Last data filed at 12/07/2021 1200 Gross per 24 hour  Intake --  Output 1650 ml  Net -1650 ml     Physical Exam  Awake Alert, Oriented X 3, No new F.N deficits, Normal affect Symmetrical Chest wall movement, Good air movement bilaterally, CTAB RRR,No Gallops,Rubs or new Murmurs, No Parasternal Heave +ve B.Sounds, Abd Soft, No tenderness,  No rebound - guarding or rigidity. No Cyanosis, Clubbing or edema, No new Rash or bruise         Data Review:    CBC Recent Labs  Lab 12/04/21 1309 12/04/21 1340 12/05/21 0425 12/06/21 0404 12/07/21 0440  WBC 4.9  --  5.1 8.6 5.3  HGB 11.3* 12.2* 11.2* 10.9* 10.7*  HCT 33.8* 36.0* 32.7* 31.4* 31.4*  PLT 75*  --  56* 56* 60*  MCV 84.3  --  83.8 84.0 84.2  MCH 28.2  --  28.7 29.1 28.7  MCHC 33.4  --  34.3 34.7 34.1  RDW 14.9  --  14.9 14.9 14.9  LYMPHSABS 0.2*  --   --  1.3 1.4  MONOABS 0.0*  --   --  0.3 0.3  EOSABS 0.0  --   --  0.1 0.1  BASOSABS 0.0  --   --  0.0 0.0    Electrolytes Recent Labs  Lab 12/04/21 1309 12/04/21 1340 12/04/21 1503 12/04/21 2257 12/05/21 0037 12/05/21 0254 12/05/21 0425 12/05/21 0645 12/06/21 0404 12/07/21 0440  NA 130*  128*  --  133*  --   --  133*  --  130* 127*  K 4.5 4.8  --  4.1  --   --  3.6  --  3.5 3.8  CL 93*  --   --  95*  --   --  99  --  101 98  CO2 22  --   --  26  --   --  25  --  24 24  GLUCOSE 467*  --   --  290*  --   --  150*  --  93 156*  BUN 42*  --   --  34*  --   --  31*  --  19 14  CREATININE 2.07*  --   --  1.54*  --   --  1.42*  --  1.07 1.15  CALCIUM 8.8*  --   --  8.7*  --   --  8.2*  --  7.8* 7.8*  AST 31  --   --   --   --   --   --   --  59* 58*  ALT 21  --   --   --   --   --   --   --  24 28  ALKPHOS 73  --   --   --   --   --   --   --  70 67  BILITOT 0.8  --   --   --   --   --   --   --  0.7 0.7  ALBUMIN 3.2*  --   --   --   --   --   --   --  2.1* 2.0*  MG  --   --   --   --   --   --   --  1.6* 1.9 1.7  CRP  --   --  11.9*  --   --   --   --  16.3* 14.8* 10.7*  PROCALCITON  --   --   --   --   --   --   --  40.42 26.49 17.97  LATICACIDVEN  --   --   --   --  4.2* 1.8  --   --   --   --   INR 1.2  --   --   --   --   --   --   --   --   --   TSH  --   --  0.465  --   --   --   --   --   --   --   HGBA1C  --   --   --  11.9*  --   --   --   --   --   --   BNP  --   --   --   --   --   --   --   181.6* 111.6* 80.9    ------------------------------------------------------------------------------------------------------------------ Recent Labs    12/05/21 0425  CHOL 118  HDL 35*  LDLCALC 59  TRIG 120  CHOLHDL 3.4    Lab Results  Component Value Date   HGBA1C 11.9 (H) 12/04/2021    No results for input(s): TSH, T4TOTAL, T3FREE, THYROIDAB in the last 72 hours.  Invalid input(s): FREET3  ------------------------------------------------------------------------------------------------------------------ ID Labs Recent Labs  Lab 12/04/21 1309 12/04/21 1503 12/04/21 2257 12/05/21 0037 12/05/21 0254 12/05/21  0425 12/05/21 0645 12/06/21 0404 12/07/21 0440  WBC 4.9  --   --   --   --  5.1  --  8.6 5.3  PLT 75*  --   --   --   --  56*  --  56* 60*  CRP  --  11.9*  --   --   --   --  16.3* 14.8* 10.7*  PROCALCITON  --   --   --   --   --   --  40.42 26.49 17.97  LATICACIDVEN  --   --   --  4.2* 1.8  --   --   --   --   CREATININE 2.07*  --  1.54*  --   --  1.42*  --  1.07 1.15   Cardiac Enzymes No results for input(s): CKMB, TROPONINI, MYOGLOBIN in the last 168 hours.  Invalid input(s): CK  Radiology Reports MR BRAIN WO CONTRAST  Result Date: 12/05/2021 CLINICAL DATA:  81 year old male acute neurologic deficit. Left side weakness. Metastatic disease unknown primary. Widely scattered small embolic appearing cerebral and cerebellar infarcts in November. New cerebellar hypodensity on CT yesterday. EXAM: MRI HEAD WITHOUT CONTRAST TECHNIQUE: Multiplanar, multiecho pulse sequences of the brain and surrounding structures were obtained without intravenous contrast. COMPARISON:  CTA head and neck yesterday.  Brain MRI 09/16/2021. FINDINGS: Brain: Mostly expected evolution of the scattered small bilateral cerebral and cerebellar infarcts seen in November, including in the right thalamus. But petechial hemorrhagic conversion of multiple bilateral cerebellar infarcts since that time,  including confluent petechial hemorrhage in the left hemisphere on series 7, image 23 with faint associated cerebellar edema. And multiple such small hemorrhagic infarcts are new since November but nonacute. No contrast administered. On DWI there is a small contralateral central right cerebellar area of diffusion restriction now (series 2, image 11). a and a small focus of white matter restricted diffusion in the left splenium (series 2, image 28) with T2 and FLAIR hyperintensity. Also questionable punctate restricted diffusion in the left occipital pole white matter, although may be susceptibility artifact from chronic microhemorrhage there also. Furthermore, there are several small bilateral frontal and parietal lobe central white matter foci of abnormal diffusion, although most appear facilitated on ADC (series 2, image 33). Fewer new but chronic microhemorrhages scattered in the supratentorial brain. Underlying confluent bilateral cerebral white matter T2 and FLAIR hyperintensity which is advanced but nonspecific. Stable similar T2 and FLAIR hyperintensity in the pons. Small areas of developing cortical encephalomalacia corresponding to interval small cortical infarcts such as medial superior right parietal lobe series 6, image 29. No midline shift, obvious mass lesion, ventriculomegaly, extra-axial collection or acute intracranial hemorrhage. Cervicomedullary junction and pituitary are within normal limits. Vascular: Major intracranial vascular flow voids are stable. Skull and upper cervical spine: Heterogeneous bone marrow signal is indeterminate in the visible cervical spine, not definitely progressed since November. No destructive osseous lesion is identified. Sinuses/Orbits: Stable, negative. Other: Mastoids are clear. Visible internal auditory structures appear normal. IMPRESSION: 1. No contrast administered, limiting evaluation for metastatic disease. 2. Progression of embolic appearing small infarcts  scattered in the bilateral cerebral and cerebellar hemispheres since November. Many such infarcts demonstrate petechial hemorrhage (including confluent new petechial hemorrhage in the left cerebellum), but no malignant hemorrhagic transformation or intracranial mass effect. Small acute infarcts are identified in the right cerebellum, left splenium. And there are multiple subacute appearing punctate white matter infarcts. 3. Underlying advanced but nonspecific signal changes in the cerebral  white matter and pons possibly due to chronic small vessel disease. Electronically Signed   By: Genevie Ann M.D.   On: 12/05/2021 09:28   CT ABDOMEN PELVIS W CONTRAST  Result Date: 12/05/2021 CLINICAL DATA:  Abdominal pain, acute, nonlocalized. Metastatic cancer of unknown primary. EXAM: CT ABDOMEN AND PELVIS WITH CONTRAST TECHNIQUE: Multidetector CT imaging of the abdomen and pelvis was performed using the standard protocol following bolus administration of intravenous contrast. RADIATION DOSE REDUCTION: This exam was performed according to the departmental dose-optimization program which includes automated exposure control, adjustment of the mA and/or kV according to patient size and/or use of iterative reconstruction technique. CONTRAST:  147mL OMNIPAQUE IOHEXOL 300 MG/ML  SOLN COMPARISON:  PET CT 10/10/2021 FINDINGS: Lower chest: No acute abnormality. Hepatobiliary: Multiple heterogeneously enhancing masses are again seen throughout both hepatic lobes with the dominant conglomerate within segment 5 of the liver similar to that noted on prior PET CT examination. The appearance is relatively stable when compared to prior MRI examination of 09/03/2021. Mild bilobar intrahepatic biliary ductal dilation appears stable since prior MRI examination. Main, right, and left portal veins are patent. The gallbladder appears intimately associated with the malignant masses within segment 5 of the liver and a primary gallbladder carcinoma  could be considered. This, too, appears unchanged from prior examination. Pancreas: Unremarkable Spleen: Unremarkable Adrenals/Urinary Tract: The adrenal glands are unremarkable. The kidneys are normal in size and position. 7 cm simple cortical cyst noted within the upper pole of the right kidney. Hyperdensity within the upper pole calices of the right kidney and within the left renal pelvis likely represents excreted contrast. No hydronephrosis. No definite nephro or urolithiasis. The bladder is unremarkable. Stomach/Bowel: The stomach is unremarkable. The second portion the duodenum is in close proximity to the porta hepatis and cystic duct and there is infiltration of the periduodenal soft tissue which may relate to edema or infiltration related to the patient's primary malignancy. This is best appreciated on coronal image # 37/6 focal narrowing of the duodenum in this region may cicatricial change. The small and large bowel are unremarkable. The appendix is not clearly identified and may be absent. No free intraperitoneal gas or fluid. Reflect Vascular/Lymphatic: Aortic atherosclerosis. No enlarged abdominal or pelvic lymph nodes. Reproductive: The prostate gland is enlarged. Metallic densities within the prostate gland may relate to UroLift procedure. Other: Tiny fat containing umbilical hernia. Musculoskeletal: No acute bone abnormality. No lytic or blastic bone lesion. IMPRESSION: Stable examination. Bilobar hepatic masses in keeping with metastatic disease with dominant conglomerate within segment 5 of the liver in close proximity to the adjacent gallbladder which appears contracted and demonstrates irregular wall thickening. 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 Infiltrative change within the porta hepatis noted with abutment of the second portion of the duodenum without evidence of obstruction or fistula formation at this time. Stable mild intrahepatic biliary ductal  dilation. No acute intra-abdominal process identified. Aortic Atherosclerosis (ICD10-I70.0). Electronically Signed   By: Fidela Salisbury M.D.   On: 12/05/2021 21:28   CT HIP LEFT WO CONTRAST  Result Date: 12/05/2021 CLINICAL DATA:  Hip pain. Infection suspected. X-ray done. Acute on chronic left hip pain. Fall at home. History of osteoarthritis. Evaluate for occult fracture. EXAM: CT OF THE LEFT HIP WITHOUT CONTRAST TECHNIQUE: Multidetector CT imaging of the left hip was performed according to the standard protocol. Multiplanar CT image reconstructions were also generated. RADIATION DOSE REDUCTION: This exam was performed according to the departmental dose-optimization program which  includes automated exposure control, adjustment of the mA and/or kV according to patient size and/or use of iterative reconstruction technique. COMPARISON:  None available FINDINGS: Bones/Joint/Cartilage Moderate superior left femoroacetabular joint space narrowing. Mild left femoral head-neck junction circumferential degenerative osteophytes. Mild-to-moderate posterosuperior left femoral head-neck junction subchondral degenerative cystic change. Mild pubic symphysis joint space narrowing and peripheral osteophytosis degenerative change. No acute fracture is seen. No dislocation. No cortical erosion to suggest osteomyelitis. Ligaments Suboptimally assessed by CT. Muscles and Tendons Unremarkable, within the limitations of this modality. Soft tissues No left hip joint effusion. There are metallic densities within the prostate, possibly brachytherapy seeds. The partially visualized prostate is moderately enlarged, impressing on the urinary bladder base. Mild posterior urinary bladder outpouching may represent a small diverticulum superior to the prostate (axial series 5, image 38). Mild fat containing left inguinal hernia. Mild-to-moderate L5-S1 disc space narrowing, vacuum phenomenon, and peripheral degenerative osteophytosis.  IMPRESSION: 1. No acute fracture or CT evidence of osteomyelitis is seen. 2. Moderate left femoroacetabular osteoarthritis. 3. Mild pubic symphysis osteoarthritis. Electronically Signed   By: Yvonne Kendall M.D.   On: 12/05/2021 13:05   DG Chest Port 1 View  Result Date: 12/05/2021 CLINICAL DATA:  81 year old male with shortness of breath. Metastatic disease unknown primary. EXAM: PORTABLE CHEST 1 VIEW COMPARISON:  Chest radiographs 09/20/2021 and earlier. FINDINGS: Portable AP semi upright view at 0610 hours. Right chest power port is new since last year. Lower lung volumes. Mediastinal contours remain within normal limits. Visualized tracheal air column is within normal limits. Diffuse crowding of lung markings with no pneumothorax, pleural effusion, consolidation, or overt edema. No lung nodule identified. Visible osseous structures within normal limits. Paucity of bowel gas. IMPRESSION: Low lung volumes, otherwise no acute cardiopulmonary abnormality. Electronically Signed   By: Genevie Ann M.D.   On: 12/05/2021 06:42   VAS [place] LOWER EXTREMITY VENOUS (DVT)  Result Date: 12/06/2021  Lower Venous DVT Study Patient Name:  COSME JACOB  Date of Exam:   12/05/2021 Medical Rec #: [id number]        Accession #:    [id number] Date of Birth: 1941/01/10        Patient Gender: M Patient Age:   49 years Exam Location:  [facility] Procedure:      VAS [place] LOWER EXTREMITY VENOUS (DVT) Referring Phys: Jennette Kettle --------------------------------------------------------------------------------  Indications: Stroke.  Risk Factors: None identified. Comparison Study: No prior studies. Performing Technologist: Oliver Hum RVT  Examination Guidelines: A complete evaluation includes B-mode imaging, spectral Doppler, color Doppler, and power Doppler as needed of all accessible portions of each vessel. Bilateral testing is considered an integral part of a complete examination. Limited examinations for reoccurring  indications may be performed as noted. The reflux portion of the exam is performed with the patient in reverse Trendelenburg.  +---------+---------------+---------+-----------+----------+--------------+  RIGHT     Compressibility Phasicity Spontaneity Properties Thrombus Aging  +---------+---------------+---------+-----------+----------+--------------+  CFV       Full            Yes       Yes                                    +---------+---------------+---------+-----------+----------+--------------+  SFJ       Full                                                             +---------+---------------+---------+-----------+----------+--------------+  FV Prox   Full                                                             +---------+---------------+---------+-----------+----------+--------------+  FV Mid    Full                                                             +---------+---------------+---------+-----------+----------+--------------+  FV Distal Full                                                             +---------+---------------+---------+-----------+----------+--------------+  PFV       Full                                                             +---------+---------------+---------+-----------+----------+--------------+  POP       Full            Yes       Yes                                    +---------+---------------+---------+-----------+----------+--------------+  PTV       Full                                                             +---------+---------------+---------+-----------+----------+--------------+  PERO      Full                                                             +---------+---------------+---------+-----------+----------+--------------+   +---------+---------------+---------+-----------+----------+-----------------+  LEFT      Compressibility Phasicity Spontaneity Properties Thrombus Aging      +---------+---------------+---------+-----------+----------+-----------------+  CFV       Full            Yes       Yes                                       +---------+---------------+---------+-----------+----------+-----------------+  SFJ       Full                                                                +---------+---------------+---------+-----------+----------+-----------------+  FV Prox   Full                                                                +---------+---------------+---------+-----------+----------+-----------------+  FV Mid    Full                                                                +---------+---------------+---------+-----------+----------+-----------------+  FV Distal Full                                                                +---------+---------------+---------+-----------+----------+-----------------+  PFV       Full                                                                +---------+---------------+---------+-----------+----------+-----------------+  POP       Full            Yes       Yes                                       +---------+---------------+---------+-----------+----------+-----------------+  PTV       Full                                                                +---------+---------------+---------+-----------+----------+-----------------+  PERO      Partial                                          Age Indeterminate  +---------+---------------+---------+-----------+----------+-----------------+     Summary: RIGHT: - There is no evidence of deep vein thrombosis in the lower extremity.  - No cystic structure found in the popliteal fossa.  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 - No cystic structure found in the popliteal fossa.  *See table(s) above for measurements and observations. Electronically signed by Orlie Pollen on 12/06/2021 at 6:50:45 AM.    Final     ECHOCARDIOGRAM LIMITED  Result Date: 12/05/2021    ECHOCARDIOGRAM LIMITED REPORT   Patient Name:   BRENDAN GADSON Date of Exam: 12/05/2021 Medical Rec #:  [id number]       Height:       70.0 in Accession #:  [id number]      Weight:       180.1 lb Date of Birth:  1941/01/03       BSA:          1.996 m Patient Age:    31 years        BP:           144/89 mmHg Patient Gender: M               HR:           83 bpm. Exam Location:  Inpatient Procedure: Limited Echo, Limited Color Doppler and Cardiac Doppler Indications:    stroke  History:        Patient has prior history of Echocardiogram examinations, most                 recent 09/17/2021. Cancer; Risk Factors:Diabetes and                 Hypertension.  Sonographer:    Johny Chess RDCS Referring Phys: [place]  1. LV function decreased since 09/17/21 no strain imaging performed . Left ventricular ejection fraction, by estimation, is 50 to 55%. The left ventricle has low normal function. The left ventricle has no regional wall motion abnormalities. The left ventricular internal cavity size was mildly dilated. There is mild left ventricular hypertrophy. Left ventricular diastolic parameters are consistent with Grade I diastolic dysfunction (impaired relaxation).  2. Right ventricular systolic function is normal. The right ventricular size is normal.  3. The mitral valve is abnormal. Trivial mitral valve regurgitation. No evidence of mitral stenosis.  4. The aortic valve is tricuspid. Aortic valve regurgitation is not visualized. No aortic stenosis is present.  5. Aortic dilatation noted. There is mild dilatation of the ascending aorta, measuring 39 mm.  6. The inferior vena cava is normal in size with greater than 50% respiratory variability, suggesting right atrial pressure of 3 mmHg. FINDINGS  Left Ventricle: LV function decreased since 09/17/21 no strain imaging performed. Left ventricular ejection fraction, by estimation, is 50 to  55%. The left ventricle has low normal function. The left ventricle has no regional wall motion abnormalities. The left ventricular internal cavity size was mildly dilated. There is mild left ventricular hypertrophy. Left ventricular diastolic parameters are consistent with Grade I diastolic dysfunction (impaired relaxation). Right Ventricle: The right ventricular size is normal. No increase in right ventricular wall thickness. Right ventricular systolic function is normal. Left Atrium: Left atrial size was normal in size. Right Atrium: Right atrial size was normal in size. Pericardium: There is no evidence of pericardial effusion. Mitral Valve: The mitral valve is abnormal. There is mild thickening of the mitral valve leaflet(s). Trivial mitral valve regurgitation. No evidence of mitral valve stenosis. Tricuspid Valve: The tricuspid valve is normal in structure. Tricuspid valve regurgitation is mild . No evidence of tricuspid stenosis. Aortic Valve: The aortic valve is tricuspid. Aortic valve regurgitation is not visualized. No aortic stenosis is present. Pulmonic Valve: The pulmonic valve was normal in structure. Pulmonic valve regurgitation is not visualized. No evidence of pulmonic stenosis. Aorta: Aortic dilatation noted. There is mild dilatation of the ascending aorta, measuring 39 mm. Venous: The inferior vena cava is normal in size with greater than 50% respiratory variability, suggesting right atrial pressure of 3 mmHg. IAS/Shunts: No atrial level shunt detected by color flow Doppler. LEFT VENTRICLE PLAX 2D LVIDd:         4.40 cm  Diastology LVIDs:         3.30 cm     LV e' medial:    3.92 cm/s LV PW:         1.00 cm     LV E/e' medial:  13.8 LV IVS:        1.30 cm     LV e' lateral:   6.74 cm/s                            LV E/e' lateral: 8.0  LV Volumes (MOD) LV vol d, MOD A2C: 75.9 ml LV vol s, MOD A2C: 47.1 ml LV SV MOD A2C:     28.8 ml IVC IVC diam: 1.40 cm LEFT ATRIUM         Index LA diam:    3.30  cm 1.65 cm/m  AORTIC VALVE LVOT Vmax:   81.10 cm/s LVOT Vmean:  55.700 cm/s LVOT VTI:    0.130 m  AORTA Ao Asc diam: 3.90 cm MITRAL VALVE MV Area (PHT): 2.96 cm    SHUNTS MV Decel Time: 256 msec    Systemic VTI: 0.13 m MV E velocity: 54.00 cm/s MV A velocity: 85.30 cm/s MV E/A ratio:  0.63 Jenkins Rouge MD Electronically signed by Jenkins Rouge MD Signature Date/Time: 12/05/2021/12:01:58 PM    Final    CT ANGIO HEAD NECK W WO CM W PERF (CODE STROKE)  Result Date: 12/04/2021 CLINICAL DATA:  Acute neuro deficit. Left-sided weakness. History of metastatic disease. EXAM: CT ANGIOGRAPHY HEAD AND NECK CT PERFUSION BRAIN TECHNIQUE: Multidetector CT imaging of the head and neck was performed using the standard protocol during bolus administration of intravenous contrast. Multiplanar CT image reconstructions and MIPs were obtained to evaluate the vascular anatomy. Carotid stenosis measurements (when applicable) are obtained utilizing NASCET criteria, using the distal internal carotid diameter as the denominator. Multiphase CT imaging of the brain was performed following IV bolus contrast injection. Subsequent parametric perfusion maps were calculated using RAPID software. RADIATION DOSE REDUCTION: This exam was performed according to the departmental dose-optimization program which includes automated exposure control, adjustment of the mA and/or kV according to patient size and/or use of iterative reconstruction technique. CONTRAST:  115mL OMNIPAQUE IOHEXOL 350 MG/ML SOLN COMPARISON:  CT head 11/18/2021 FINDINGS: CT HEAD FINDINGS Brain: Generalized atrophy. Chronic microvascular ischemic change throughout the white matter. Chronic infarct right thalamus. Interval contraction of hypodensity in the right cerebellum compatible with resolving infarct. Chronic infarct left superior cerebellum. Hypodensity in the central right cerebellum has progressed from prior studies and may be recent extension of a previously noted  infarct. This measures 1 cm. Chronic infarct right superior cerebellum unchanged Negative for hemorrhage or mass. Vascular: Negative for hyperdense vessel Skull: Negative Sinuses: Paranasal sinuses clear Orbits: Bilateral cataract extraction. ASPECTS ([place] Stroke Program Early CT Score) - Ganglionic level infarction (caudate, lentiform nuclei, internal capsule, insula, M1-M3 cortex): 7 - Supraganglionic infarction (M4-M6 cortex): 3 Total score (0-10 with 10 being normal): 10 Review of the MIP images confirms the above findings CTA NECK FINDINGS Aortic arch: Normal aortic arch. Proximal great vessels widely patent. Bovine branching arch. Right carotid system: Right carotid widely patent without stenosis or atherosclerotic disease. Left carotid system: Left carotid widely patent. Negative for atherosclerotic disease or stenosis Vertebral arteries: Both vertebral arteries widely patent to the skull base. No significant stenosis. Skeleton: Degenerative changes in the cervical spine. No acute skeletal abnormality. Other neck: Negative for mass or edema  in the neck. Port-A-Cath tip right jugular vein. Upper chest: 8.5 mm spiculated nodule right upper lobe unchanged from PET-CT 10/10/2021. No other lung nodule in the apices. Review of the MIP images confirms the above findings CTA HEAD FINDINGS Anterior circulation: Cavernous carotid widely patent bilaterally. Anterior and middle cerebral arteries widely patent bilaterally. No stenosis or vascular malformation. Posterior circulation: Both vertebral arteries patent to the basilar. Basilar widely patent. PICA patent bilaterally. AICA, superior cerebellar, posterior cerebral arteries patent without stenosis or large vessel occlusion. Negative for cerebrovascular malformation. Venous sinuses: Normal venous enhancement Anatomic variants: None Review of the MIP images confirms the above findings CT Brain Perfusion Findings: ASPECTS: 10 CBF (<30%) Volume: 72mL Perfusion  (Tmax>6.0s) volume: 57mL Mismatch Volume: 74mL Infarction Location:None IMPRESSION: 1. CT perfusion negative for acute infarct or ischemia 2. Negative for intracranial large vessel occlusion 3. No carotid or vertebral artery stenosis in the neck. No significant atherosclerotic disease. 4. CT head demonstrates new hypodensity right mid cerebellum. This could represent interval acute infarct or possibly metastatic disease given history. Follow-up MRI brain without and with contrast recommended. Additional chronic ischemic changes stable from prior studies. 5. 8.5 mm spiculated nodule right upper lobe, stable from prior PET-CT 10/10/2021 Electronically Signed   By: Franchot Gallo M.D.   On: 12/04/2021 14:08     Patient ID: [name], male   DOB: 04/13/1941, 81 y.o.   MRN: [id number]

## 2021-12-07 NOTE — Progress Notes (Signed)
ANTICOAGULATION CONSULT NOTE - Initial Consult  Pharmacy Consult for heparin Indication: stroke  No Known Allergies  Patient Measurements: Height: 5' 10"  (177.8 cm) Weight: 81.7 kg (180 lb 1.9 oz) IBW/kg (Calculated) : 73 Heparin Dosing Weight: 80kg  Vital Signs: Temp: 98.4 F (36.9 C) (01/25 0319) Temp Source: Oral (01/25 0319) BP: 133/84 (01/25 0319) Pulse Rate: 75 (01/25 0319)  Labs: Recent Labs    12/04/21 1309 12/04/21 1340 12/05/21 0425 12/06/21 0040 12/06/21 0404 12/06/21 1002 12/06/21 2205 12/07/21 0440  HGB 11.3*   < > 11.2*  --  10.9*  --   --  10.7*  HCT 33.8*   < > 32.7*  --  31.4*  --   --  31.4*  PLT 75*  --  56*  --  56*  --   --  60*  APTT 28  --   --   --   --   --   --   --   LABPROT 14.7  --   --   --   --   --   --   --   INR 1.2  --   --   --   --   --   --   --   HEPARINUNFRC  --   --   --    < >  --  0.60 0.50 0.57  CREATININE 2.07*   < > 1.42*  --  1.07  --   --  1.15   < > = values in this interval not displayed.     Estimated Creatinine Clearance: 51.1 mL/min (by C-G formula based on SCr of 1.15 mg/dL).   Medical History: Past Medical History:  Diagnosis Date   Arthritis    Cancer ([place])    Cataract    Diabetes mellitus    Hyperlipidemia    Stroke ([place])     Medications:  Medications Prior to Admission  Medication Sig Dispense Refill Last Dose   acetaminophen (TYLENOL) 650 MG CR tablet Take 650 mg by mouth every 8 (eight) hours as needed for pain.   Past Week   atorvastatin (LIPITOR) 40 MG tablet Take 1 tablet (40 mg total) by mouth daily. 90 tablet 1 12/04/2021   Cholecalciferol (VITAMIN D3) 2000 UNITS TABS Take 2,000 Units by mouth daily.   12/04/2021   lidocaine-prilocaine (EMLA) cream Apply 1 application topically as needed. Apply 1/2 tablespoon to port site 2 hours prior to stick and cover with Press-and-Seal to numb site for access (Patient taking differently: Apply 1 application topically daily as needed (port site access).)  30 g 1 Past Month   metFORMIN (GLUCOPHAGE-XR) 750 MG 24 hr tablet TAKE 1 TABLET BY MOUTH EVERY DAY WITH BREAKFAST (Patient taking differently: Take 750 mg by mouth daily with breakfast.) 90 tablet 0 12/04/2021   ondansetron (ZOFRAN) 8 MG tablet Take 1 tablet (8 mg total) by mouth every 8 (eight) hours as needed for nausea or vomiting. Start 72 hours after each IV chemotherapy administration 30 tablet 1 Past Month   prochlorperazine (COMPAZINE) 10 MG tablet Take 1 tablet (10 mg total) by mouth every 6 (six) hours as needed for nausea. 60 tablet 1 Past Month   silodosin (RAPAFLO) 8 MG CAPS capsule Take 8 mg by mouth at bedtime.   Past Week   traMADol (ULTRAM) 50 MG tablet Take 1 tablet (50 mg total) by mouth every 6 (six) hours as needed for moderate pain. 60 tablet 0 Past Week   TRAVATAN Z  0.004 % SOLN ophthalmic solution Place 1 drop into both eyes at bedtime.   Past Week   Accu-Chek Softclix Lancets lancets Use to test blood sugar levels once a day. 100 each 12    Blood Glucose Monitoring Suppl (ACCU-CHEK AVIVA PLUS) w/Device KIT Use to check blood sugar once a day 1 kit 0    clopidogrel (PLAVIX) 75 MG tablet Take 75 mg by mouth daily.   12/03/2021   Continuous Blood Gluc Receiver (DEXCOM G6 RECEIVER) DEVI Use to check blood sugar levels as directed. 1 each 0    Continuous Blood Gluc Sensor (DEXCOM G6 SENSOR) MISC Use to check blood glucose levels as directed. 3 each 1    glucose blood test strip Use as instructed 100 each 12    insulin glargine, 2 Unit Dial, (TOUJEO MAX SOLOSTAR) 300 UNIT/ML Solostar Pen Inject 10 Units into the skin daily. (Patient taking differently: Inject 20 Units into the skin daily.) 3 mL 1 12/03/2021   insulin lispro (HUMALOG KWIKPEN) 200 UNIT/ML KwikPen Give 5 units [place] twice daily- with breakfast and supper. (Patient not taking: Reported on 12/05/2021) 3 mL 2 Not Taking   pantoprazole (PROTONIX) 20 MG tablet TAKE 1 TABLET(20 MG) BY MOUTH DAILY (Patient not taking: Reported on  12/05/2021) 90 tablet 0 Not Taking   Scheduled:   atorvastatin  40 mg Oral Daily   Chlorhexidine Gluconate Cloth  6 each Topical Daily   insulin aspart  0-15 Units Subcutaneous TID WC   insulin glargine-yfgn  15 Units Subcutaneous Daily   Infusions:   cefTRIAXone (ROCEPHIN)  IV 2 g (12/06/21 1341)   heparin 1,100 Units/hr (12/07/21 0447)    Assessment: Pt who presented with dizziness was found to have a new CVA. He has a hx of metastatic adenocarcinoma. Plan is to do heparin bridging before going to PO anticoagulation. Pt was on Plavix prior to admission.   Heparin came back slight above goal. We will decrease heparin slightly with plan to transition to PO apixaban today.   Goal of Therapy:  Heparin level 0.3-0.5 units/ml Monitor platelets by anticoagulation protocol: Yes   Plan:  Decrease heparin to 1050 units/hr Daily HL and CBC F/u PO AC  Onnie Boer, PharmD, BCIDP, AAHIVP, CPP Infectious Disease Pharmacist 12/07/2021 8:21 AM

## 2021-12-07 NOTE — Progress Notes (Signed)
Occupational Therapy Treatment Patient Details Name: Robert Warner MRN: [id number] DOB: 15-Dec-1940 Today's Date: 12/07/2021   History of present illness 81 y.o. M admitted 1/22 with imbalance and LUE weakness. MRI showing progression of small infarcts scattered in the bil cerebral and cerebellar hemispheres since November. PMHx: CA, HLD, DM2, prior embolic CVA.   OT comments  Robert Warner demonstrated excellent progress towards his acute care goals this session. He was able to transfer into standing from lower chair surface with min G and increased time for L hip pain management. Pt stood at the sink with fair balance to groom and bathe with min G and verbal cues for problem solving/energy conservation strategies. Pt had improved activity tolerance this session. Pt left sitting at the sink shaving his face, wife present, NT aware to assist pt back to his chair. Pt continues to benefit from OT acutely. D/c plan remains appropriate.    Recommendations for follow up therapy are one component of a multi-disciplinary discharge planning process, led by the attending physician.  Recommendations may be updated based on patient status, additional functional criteria and insurance authorization.    Follow Up Recommendations  Home health OT    Assistance Recommended at Discharge Frequent or constant Supervision/Assistance  Patient can return home with the following  A little help with walking and/or transfers;A little help with bathing/dressing/bathroom;Direct supervision/assist for medications management;Assist for transportation;Help with stairs or ramp for entrance;Assistance with cooking/housework   Equipment Recommendations  None recommended by OT       Precautions / Restrictions Precautions Precautions: Fall Restrictions Weight Bearing Restrictions: No       Mobility Bed Mobility               General bed mobility comments: pt in chair upon arrival, returned to chair     Transfers Overall transfer level: Needs assistance Equipment used: Rolling walker (2 wheels), None Transfers: Sit to/from Stand Sit to Stand: Min guard           General transfer comment: pt completed several sit<>stands at the sink from a low chair height - required increased time and cues for safe hand placement. overall, min G for transfers     Balance Overall balance assessment: Needs assistance   Sitting balance-Leahy Scale: Good     Standing balance support: Single extremity supported, During functional activity Standing balance-Leahy Scale: Fair Standing balance comment: pt able to stand at the sink to groom - benefits from at least 1 UE supported                           ADL either performed or assessed with clinical judgement   ADL Overall ADL's : Needs assistance/impaired     Grooming: Min guard;Sitting;Standing Grooming Details (indicate cue type and reason): groomed at the sink in sitting and standing     Lower Body Bathing: Minimal assistance;Sit to/from stand Lower Body Bathing Details (indicate cue type and reason): at the sink                     Functional mobility during ADLs: Min guard;Rolling walker (2 wheels) General ADL Comments: pt with excellent progress this session. using LUE funtionally with good activity tolerance    Extremity/Trunk Assessment Upper Extremity Assessment Upper Extremity Assessment: RUE deficits/detail;LUE deficits/detail RUE Deficits / Details: Limited over head ROM, globally 4/5 - used functionally for grooming and bathing at the sink LUE Deficits / Details: limited over head ROM,  globally 4/5 - impaired dexterity, dropped grooming items with left             Cognition Arousal/Alertness: Awake/alert Behavior During Therapy: WFL for tasks assessed/performed Overall Cognitive Status: History of cognitive impairments - at baseline                    General Comments VSS on RA, wife present  and supportive    Pertinent Vitals/ Pain       Pain Assessment Pain Assessment: Faces Faces Pain Scale: Hurts a little bit Pain Location: left hip with gait Pain Descriptors / Indicators: Aching Pain Intervention(s): Limited activity within patient's tolerance, Monitored during session   Frequency  Min 2X/week        Progress Toward Goals  OT Goals(current goals can now be found in the care plan section)  Progress towards OT goals: Progressing toward goals  Acute Rehab OT Goals Patient Stated Goal: home soon OT Goal Formulation: With patient Time For Goal Achievement: 12/19/21 Potential to Achieve Goals: Good ADL Goals Pt Will Perform Grooming: with modified independence;standing Pt Will Perform Upper Body Dressing: with modified independence;sitting Pt Will Perform Lower Body Dressing: with modified independence;sit to/from stand Pt Will Transfer to Toilet: with modified independence;ambulating  Plan Discharge plan remains appropriate       AM-PAC OT "6 Clicks" Daily Activity     Outcome Measure   Help from another person eating meals?: None Help from another person taking care of personal grooming?: A Little Help from another person toileting, which includes using toliet, bedpan, or urinal?: A Little Help from another person bathing (including washing, rinsing, drying)?: A Little Help from another person to put on and taking off regular upper body clothing?: A Little Help from another person to put on and taking off regular lower body clothing?: A Little 6 Click Score: 19    End of Session Equipment Utilized During Treatment: Gait belt;Rolling walker (2 wheels)  OT Visit Diagnosis: Unsteadiness on feet (R26.81);Other abnormalities of gait and mobility (R26.89);Muscle weakness (generalized) (M62.81);Pain;History of falling (Z91.81)   Activity Tolerance Patient tolerated treatment well   Patient Left Other (comment) (at the sink, shaving, NT aware)   Nurse  Communication Mobility status        Time: 8366-2947 OT Time Calculation (min): 25 min  Charges: OT General Charges $OT Visit: 1 Visit OT Treatments $Self Care/Home Management : 23-37 mins   Robert Warner A Robert Warner 12/07/2021, 6:39 PM

## 2021-12-07 NOTE — Assessment & Plan Note (Signed)
On 2 g IV Rocephin source most likely gallbladder pathology as above, continue to monitor on IV antibiotics.

## 2021-12-07 NOTE — Progress Notes (Signed)
[place] for Infectious Disease    Date of Admission:  12/04/2021   Total days of antibiotics 3/ day 2 ceftriaxone          ID: Robert Warner is a 81 y.o. male with  Principal Problem:   Acute embolic stroke ([place]) Active Problems:   Type 2 diabetes mellitus with hyperglycemia ([place])   Essential hypertension   Recent cerebrovascular accident (CVA)   Interatrial cardiac shunt   Cancer with unknown primary site ([place])   AKI (acute kidney injury) ([place])   Hyponatremia   CVA (cerebral vascular accident) ([place])   Bacteremia due to Klebsiella pneumoniae   DVT in Left peroneal vein    Subjective: Afebrile, feels ansty. Looking forward to working with PT. Denies fever, chills, n/v  Medications:   atorvastatin  40 mg Oral Daily   Chlorhexidine Gluconate Cloth  6 each Topical Daily   insulin aspart  0-15 Units Subcutaneous TID WC   insulin glargine-yfgn  15 Units Subcutaneous Daily   polyethylene glycol  34 g Oral BID   tamsulosin  0.4 mg Oral QPC supper    Objective: Vital signs in last 24 hours: Temp:  [97.6 F (36.4 C)-98.4 F (36.9 C)] 97.6 F (36.4 C) (01/25 1150) Pulse Rate:  [74-89] 86 (01/25 1150) Resp:  [12-19] 16 (01/25 1150) BP: (126-142)/(84-98) 138/91 (01/25 1150) SpO2:  [97 %-99 %] 97 % (01/25 1150) Physical Exam  Constitutional: He is oriented to person, place, and time. He appears well-developed and well-nourished. No distress.  HENT:  Mouth/Throat: Oropharynx is clear and moist. No oropharyngeal exudate.  Cardiovascular: Normal rate, regular rhythm and normal heart sounds. Exam reveals no gallop and no friction rub.  No murmur heard.  Chest wall = portacath intact no tenderness Pulmonary/Chest: Effort normal and breath sounds normal. No respiratory distress. He has no wheezes.  Abdominal: Soft. Bowel sounds are normal. He exhibits no distension. There is no tenderness.  Lymphadenopathy:  He has no cervical adenopathy.  Neurological: He is alert and  oriented to person, place, and time.  Skin: Skin is warm and dry. No rash noted. No erythema.  Psychiatric: He has a normal mood and affect. His behavior is normal.   Lab Results Recent Labs    12/06/21 0404 12/07/21 0440  WBC 8.6 5.3  HGB 10.9* 10.7*  HCT 31.4* 31.4*  NA 130* 127*  K 3.5 3.8  CL 101 98  CO2 24 24  BUN 19 14  CREATININE 1.07 1.15   Liver Panel Recent Labs    12/06/21 0404 12/07/21 0440  PROT 4.8* 4.7*  ALBUMIN 2.1* 2.0*  AST 59* 58*  ALT 24 28  ALKPHOS 70 67  BILITOT 0.7 0.7   Sedimentation Rate No results for input(s): ESRSEDRATE in the last 72 hours. C-Reactive Protein Recent Labs    12/06/21 0404 12/07/21 0440  CRP 14.8* 10.7*    Microbiology: Blood cx = 1/25 ngtd Blood cx 1/23 - klebseilla pneumoniae Studies/Results: CT ABDOMEN PELVIS W CONTRAST  Result Date: 12/05/2021 CLINICAL DATA:  Abdominal pain, acute, nonlocalized. Metastatic cancer of unknown primary. EXAM: CT ABDOMEN AND PELVIS WITH CONTRAST TECHNIQUE: Multidetector CT imaging of the abdomen and pelvis was performed using the standard protocol following bolus administration of intravenous contrast. RADIATION DOSE REDUCTION: This exam was performed according to the departmental dose-optimization program which includes automated exposure control, adjustment of the mA and/or kV according to patient size and/or use of iterative reconstruction technique. CONTRAST:  18mL OMNIPAQUE IOHEXOL 300  MG/ML  SOLN COMPARISON:  PET CT 10/10/2021 FINDINGS: Lower chest: No acute abnormality. Hepatobiliary: Multiple heterogeneously enhancing masses are again seen throughout both hepatic lobes with the dominant conglomerate within segment 5 of the liver similar to that noted on prior PET CT examination. The appearance is relatively stable when compared to prior MRI examination of 09/03/2021. Mild bilobar intrahepatic biliary ductal dilation appears stable since prior MRI examination. Main, right, and left  portal veins are patent. The gallbladder appears intimately associated with the malignant masses within segment 5 of the liver and a primary gallbladder carcinoma could be considered. This, too, appears unchanged from prior examination. Pancreas: Unremarkable Spleen: Unremarkable Adrenals/Urinary Tract: The adrenal glands are unremarkable. The kidneys are normal in size and position. 7 cm simple cortical cyst noted within the upper pole of the right kidney. Hyperdensity within the upper pole calices of the right kidney and within the left renal pelvis likely represents excreted contrast. No hydronephrosis. No definite nephro or urolithiasis. The bladder is unremarkable. Stomach/Bowel: The stomach is unremarkable. The second portion the duodenum is in close proximity to the porta hepatis and cystic duct and there is infiltration of the periduodenal soft tissue which may relate to edema or infiltration related to the patient's primary malignancy. This is best appreciated on coronal image # 37/6 focal narrowing of the duodenum in this region may cicatricial change. The small and large bowel are unremarkable. The appendix is not clearly identified and may be absent. No free intraperitoneal gas or fluid. Reflect Vascular/Lymphatic: Aortic atherosclerosis. No enlarged abdominal or pelvic lymph nodes. Reproductive: The prostate gland is enlarged. Metallic densities within the prostate gland may relate to UroLift procedure. Other: Tiny fat containing umbilical hernia. Musculoskeletal: No acute bone abnormality. No lytic or blastic bone lesion. IMPRESSION: Stable examination. Bilobar hepatic masses in keeping with metastatic disease with dominant conglomerate within segment 5 of the liver in close proximity to the adjacent gallbladder which appears contracted and demonstrates irregular wall thickening. 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 Infiltrative change within the porta hepatis  noted with abutment of the second portion of the duodenum without evidence of obstruction or fistula formation at this time. Stable mild intrahepatic biliary ductal dilation. No acute intra-abdominal process identified. Aortic Atherosclerosis (ICD10-I70.0). Electronically Signed   By: Fidela Salisbury M.D.   On: 12/05/2021 21:28     Assessment/Plan: Klebsiella bacteremia = suspect GI source though he was not symptomatic. Plan for 2 wk of abtx. Will narrow to cefazolin through the time he is hospitalized then finish out the course with oral abtx  If repeat blood cx are + then will need to remove port as well as do TEE to evaluated for endocarditis  CVA = suspect it is related to DVT and PFO phenomenon. Undergoing anticoagulation  Will arrange for follow up in the ID clinic in 2wks at the conclusion of abtx to do surveillance cultures.  [facility] for Infectious Diseases Pager: [phone]  12/07/2021, 2:20 PM

## 2021-12-07 NOTE — Assessment & Plan Note (Signed)
May require closure in the light of ongoing strokes.  Consult cardiology as he is having recurrent strokes and may require right to left shunt closure sooner than later.

## 2021-12-07 NOTE — Telephone Encounter (Signed)
Spoke with the patients insurance. They asked that we start a PA for Humalog on cover my meds. Attempted to start the PA but we do not have his prescription drug coverage information. Will call the pharmacy to get this.   They also stated that they will not cover Dexcom systems even with a PA.   Please advise.

## 2021-12-07 NOTE — Assessment & Plan Note (Signed)
He finished his fourth cycle of FOLFOX on 11/30/2020.  CT scan done this admission now suggestive of possible gallbladder malignancy.  GI and Dr. Rondel Oh both consulted on 12/06/2021

## 2021-12-08 ENCOUNTER — Other Ambulatory Visit (HOSPITAL_COMMUNITY): Payer: Self-pay

## 2021-12-08 ENCOUNTER — Other Ambulatory Visit: Payer: Self-pay

## 2021-12-08 ENCOUNTER — Telehealth: Payer: Self-pay

## 2021-12-08 DIAGNOSIS — I639 Cerebral infarction, unspecified: Secondary | ICD-10-CM | POA: Diagnosis not present

## 2021-12-08 LAB — COMPREHENSIVE METABOLIC PANEL
ALT: 32 U/L (ref 0–44)
AST: 69 U/L — ABNORMAL HIGH (ref 15–41)
Albumin: 2 g/dL — ABNORMAL LOW (ref 3.5–5.0)
Alkaline Phosphatase: 71 U/L (ref 38–126)
Anion gap: 5 (ref 5–15)
BUN: 16 mg/dL (ref 8–23)
CO2: 25 mmol/L (ref 22–32)
Calcium: 7.9 mg/dL — ABNORMAL LOW (ref 8.9–10.3)
Chloride: 101 mmol/L (ref 98–111)
Creatinine, Ser: 1.09 mg/dL (ref 0.61–1.24)
GFR, Estimated: >60 mL/min (ref >60)
Glucose, Bld: 224 mg/dL — ABNORMAL HIGH (ref 70–99)
Potassium: 4 mmol/L (ref 3.5–5.1)
Sodium: 131 mmol/L — ABNORMAL LOW (ref 135–145)
Total Bilirubin: 0.6 mg/dL (ref 0.3–1.2)
Total Protein: 4.8 g/dL — ABNORMAL LOW (ref 6.5–8.1)

## 2021-12-08 LAB — CBC WITH DIFFERENTIAL/PLATELET
Abs Immature Granulocytes: 0.03 10*3/uL (ref 0.00–0.07)
Basophils Absolute: 0 10*3/uL (ref 0.0–0.1)
Basophils Relative: 0 %
Eosinophils Absolute: 0.1 10*3/uL (ref 0.0–0.5)
Eosinophils Relative: 3 %
HCT: 30.9 % — ABNORMAL LOW (ref 39.0–52.0)
Hemoglobin: 10.5 g/dL — ABNORMAL LOW (ref 13.0–17.0)
Immature Granulocytes: 1 %
Lymphocytes Relative: 35 %
Lymphs Abs: 1.3 10*3/uL (ref 0.7–4.0)
MCH: 28.5 pg (ref 26.0–34.0)
MCHC: 34 g/dL (ref 30.0–36.0)
MCV: 83.7 fL (ref 80.0–100.0)
Monocytes Absolute: 0.4 10*3/uL (ref 0.1–1.0)
Monocytes Relative: 12 %
Neutro Abs: 1.8 10*3/uL (ref 1.7–7.7)
Neutrophils Relative %: 49 %
Platelets: 64 10*3/uL — ABNORMAL LOW (ref 150–400)
RBC: 3.69 MIL/uL — ABNORMAL LOW (ref 4.22–5.81)
RDW: 14.9 % (ref 11.5–15.5)
WBC: 3.7 10*3/uL — ABNORMAL LOW (ref 4.0–10.5)
nRBC: 0 % (ref 0.0–0.2)

## 2021-12-08 LAB — GLUCOSE, CAPILLARY
Glucose-Capillary: 213 mg/dL — ABNORMAL HIGH (ref 70–99)
Glucose-Capillary: 196 mg/dL — ABNORMAL HIGH (ref 70–99)
Glucose-Capillary: 213 mg/dL — ABNORMAL HIGH (ref 70–99)
Glucose-Capillary: 274 mg/dL — ABNORMAL HIGH (ref 70–99)

## 2021-12-08 LAB — BRAIN NATRIURETIC PEPTIDE: B Natriuretic Peptide: 56 pg/mL (ref 0.0–100.0)

## 2021-12-08 LAB — HEPARIN LEVEL (UNFRACTIONATED): Heparin Unfractionated: 0.57 IU/mL (ref 0.30–0.70)

## 2021-12-08 LAB — MAGNESIUM: Magnesium: 1.6 mg/dL — ABNORMAL LOW (ref 1.7–2.4)

## 2021-12-08 LAB — C-REACTIVE PROTEIN: CRP: 9.6 mg/dL — ABNORMAL HIGH (ref <1.0)

## 2021-12-08 MED ORDER — APIXABAN 5 MG PO TABS
5.0000 mg | ORAL_TABLET | Freq: Two times a day (BID) | ORAL | Status: DC
  Administered 2021-12-08 – 2021-12-09 (×4): 5 mg via ORAL
  Filled 2021-12-08 (×4): qty 1

## 2021-12-08 MED ORDER — SENNOSIDES-DOCUSATE SODIUM 8.6-50 MG PO TABS
3.0000 | ORAL_TABLET | Freq: Once | ORAL | Status: AC
  Administered 2021-12-08: 3 via ORAL
  Filled 2021-12-08: qty 3

## 2021-12-08 MED ORDER — INSULIN GLARGINE-YFGN 100 UNIT/ML ~~LOC~~ SOLN
25.0000 [IU] | Freq: Every day | SUBCUTANEOUS | Status: DC
  Administered 2021-12-09: 25 [IU] via SUBCUTANEOUS
  Filled 2021-12-08 (×2): qty 0.25

## 2021-12-08 MED ORDER — MAGNESIUM HYDROXIDE 400 MG/5ML PO SUSP
30.0000 mL | Freq: Once | ORAL | Status: AC
  Administered 2021-12-08: 30 mL via ORAL
  Filled 2021-12-08: qty 30

## 2021-12-08 MED ORDER — BISACODYL 5 MG PO TBEC
10.0000 mg | DELAYED_RELEASE_TABLET | Freq: Once | ORAL | Status: AC
  Administered 2021-12-08: 10 mg via ORAL
  Filled 2021-12-08: qty 2

## 2021-12-08 MED ORDER — CEFADROXIL 500 MG PO CAPS: 1000.0000 mg | ORAL_CAPSULE | Freq: Two times a day (BID) | ORAL | 0 refills | Status: AC

## 2021-12-08 MED ORDER — MAGNESIUM SULFATE 2 GM/50ML IV SOLN
2.0000 g | Freq: Once | INTRAVENOUS | Status: AC
Start: 2021-12-08 — End: 2021-12-08
  Administered 2021-12-08: 2 g via INTRAVENOUS
  Filled 2021-12-08: qty 50

## 2021-12-08 MED ORDER — INSULIN GLARGINE-YFGN 100 UNIT/ML ~~LOC~~ SOLN
20.0000 [IU] | Freq: Every day | SUBCUTANEOUS | Status: DC
  Administered 2021-12-08: 20 [IU] via SUBCUTANEOUS
  Filled 2021-12-08: qty 0.2

## 2021-12-08 NOTE — Progress Notes (Signed)
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  No abdominal pain.  No other complaint.  Objective: Vital signs in last 24 hours: Blood pressure (!) 141/92, pulse 90, temperature 98.2 F (36.8 C), temperature source Oral, resp. rate 18, height _0  (1.778 m), weight 180 lb 1.9 oz (81.7 kg), SpO2 93 %.  Intake/Output from previous day: 01/25 0701 - 01/26 0700 In: 7106 [P.O.:237; I.V.:658; IV Piggyback:300] Out: 2250 [Urine:2250]  Physical Exam:  HEENT: Hyperpigmentation of the tongue and buccal mucosa, no thrush  Abdomen: Soft and nontender, no hepatosplenomegaly, no mass Extremities: No leg edema   Portacath/PICC-without erythema  Lab Results: Recent Labs    12/07/21 0440 12/08/21 0315  WBC 5.3 3.7*  HGB 10.7* 10.5*  HCT 31.4* 30.9*  PLT 60* 64*    BMET Recent Labs    12/07/21 0440 12/08/21 0315  NA 127* 131*  K 3.8 4.0  CL 98 101  CO2 24 25  GLUCOSE 156* 224*  BUN 14 16  CREATININE 1.15 1.09  CALCIUM 7.8* 7.9*    Lab Results  Component Value Date   CEA 33.79 (H) 09/29/2021   YIR485 81 (H) 09/29/2021     Medications: I have reviewed the patient's current medications.  Assessment/Plan: Poorly differentiated adenocarcinoma involving the liver Abdominal ultrasound 08/05/2021-indeterminate 61m solid mass in the right hepatic lobe.   MRI of the liver 09/06/2021-multifocal rim-enhancing lesions throughout both lobes of the liver.  Index lesion within the lateral dome of right hepatic lobe measures 1.1 cm, segment 4A lesion measures 1.6 x 1.4 cm, segment 2 lesion measures 0.8 x 0.7 cm, posteromedial margin of the right hepatic lobe subcapsular lesion measures 2.5 x 2.0 cm 09/16/2021 CT angio neck-9 mm right upper lobe nodule Biopsy of a right liver mass on 09/23/2021.  Pathology shows poorly differentiated adenocarcinoma positive for cytokeratin 7, CDX2 and cytokeratin 20 and negative for TTF-1, PSA and prostein.  Differential diagnoses  include pancreatobiliary and less likely upper GI. HER2 positive by FISH PD-L1 combined positive score 0% MSS equivocal, tumor mutation burden 0, ERBB2 amplification equivocal 09/29/2021 CA 19-9 81, CEA 33 PET scan 10/10/2021-solitary 10 mm right upper lobe pulmonary nodule with minimal FDG uptake.  7.5 mm left internal mammary lymph node hypermetabolic with SUV max 44.62  Numerous hepatic lesions.  Periportal lymphadenopathy SUV max 8.18.  2 adjacent lesions noted in the mesentery in the right mid abdomen with somewhat irregular margins, SUV 8.09.  No hypermetabolic colonic lesion identified to suggest a primary colon cancer.  No pancreatic lesion.  No gastric lesion.  No retroperitoneal lymphadenopathy. Upper endoscopy 10/13/2021-no mass, H. pylori gastritis on gastric biopsy Cycle 1 FOLFOX 10/19/2021 Cycle 2 FOLFOX 11/02/2021, Emend added, oxaliplatin dose reduced Cycle 3 FOLFOX 11/16/2021 Cycle 4 FOLFOX 11/30/2021 CT abdomen/pelvis with contrast on 12/05/2021-stable exam with by lobar hepatic masses, findings suggestive of metastatic gallbladder cancer. Hospital admission 09/16/2021 - 09/19/2021 with gait disturbance-multifocal embolic stroke on MRI 170/01/5008 echo with interatrial shunt, transcranial Doppler with bubble study indicative of a medium size right to left shunt, lower extremity Doppler studies negative for DVT Hospital admission 09/20/2021 - 09/22/2021 with chest pain-CT coronary with no acute findings.   Diabetes Hypertension BPH Chronic kidney disease Brain CT 11/18/2021-new "lesion" in the left cerebellum when compared to brain MRI 09/16/2021.  Appearance of rapid development suspicious for a subacute infarct.  Metastatic focus not excluded.  Follow-up brain MRI with contrast recommended in the next 4 to 6 weeks.  [facility] admission 12/05/2021-CVA 10.  Klebsiella bacteremia 12/05/2021-no apparent source for infection 11.  Thrombocytopenia secondary to chemotherapy and bacteremia 12.   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  He is now at day 9 following the most recent cycle of FOLFOX.  He is stable from a hematologic standpoint.  He he is maintained on apixaban anticoagulation for the DVT and CVAs.  He is scheduled for outpatient follow-up at the Cancer center 12/14/2021.  We will decide on continuing chemotherapy when he is seen on 12/14/2021.  Recommendations: He is stable for discharge from an oncology standpoint Continue apixaban anticoagulation for the DVT and CVA's, as recommended by neurology Antibiotics as recommended by infectious disease Outpatient follow-up at the cancer center as scheduled 12/14/2021   LOS: 2 days   [name], MD   12/08/2021, 10:33 AM

## 2021-12-08 NOTE — Progress Notes (Signed)
Physical Therapy Treatment Patient Details Name: Robert Warner MRN: [id number] DOB: [birth year] Today's Date: 12/08/2021   History of Present Illness 81 y.o. M admitted 1/22 with imbalance and LUE weakness. MRI showing progression of small infarcts scattered in the bil cerebral and cerebellar hemispheres since November. PMHx: CA, HLD, DM2, prior embolic CVA.    PT Comments    Pt with decline in mobility since last visit. Pt with significant difficulty coming to stand, left knee buckling with ambulation, and lt hip pain impacting both. Had planned to do stair training with pt but with decline in mobility and especially with lt knee buckling did not feel it was safe to do so. At this level feel it would be difficult for wife to assist him at home so updated dc recommendation to SNF.    Recommendations for follow up therapy are one component of a multi-disciplinary discharge planning process, led by the attending physician.  Recommendations may be updated based on patient status, additional functional criteria and insurance authorization.  Follow Up Recommendations  Skilled nursing-short term rehab (<3 hours/day)     Assistance Recommended at Discharge Frequent or constant Supervision/Assistance  Patient can return home with the following A lot of help with walking and/or transfers;Help with stairs or ramp for entrance;Assist for transportation   Equipment Recommendations  None recommended by PT    Recommendations for Other Services       Precautions / Restrictions Precautions Precautions: Fall     Mobility  Bed Mobility               General bed mobility comments: Pt up in chair    Transfers Overall transfer level: Needs assistance Equipment used: Rolling walker (2 wheels), None Transfers: Sit to/from Stand Sit to Stand: Mod assist, +2 physical assistance           General transfer comment: Assist to bring hips up and for balance. Pt with lt hip pain making  powering up to stand difficult. Varied from min to mod assist of 1 to 2 person mod assist. Frequent verbal/tactile cues for hand placement    Ambulation/Gait Ambulation/Gait assistance: Min assist, +2 safety/equipment Gait Distance (Feet): 150 Feet Assistive device: Rolling walker (2 wheels) Gait Pattern/deviations: Step-through pattern, Decreased stride length, Knees buckling, Trunk flexed Gait velocity: decr Gait velocity interpretation: <1.8 ft/sec, indicate of risk for recurrent falls   General Gait Details: Assist for balance and support. Lt knee buckling multiple times during gait. Verbal/tactile cues for posture and to keep feet inside base of walker especially on turns. Pt running into objects on lt.   Stairs             Wheelchair Mobility    Modified Rankin (Stroke Patients Only)       Balance Overall balance assessment: Needs assistance   Sitting balance-Leahy Scale: Good     Standing balance support: During functional activity, Bilateral upper extremity supported, Reliant on assistive device for balance Standing balance-Leahy Scale: Poor Standing balance comment: walker and min assist for static standing                            Cognition Arousal/Alertness: Awake/alert Behavior During Therapy: WFL for tasks assessed/performed Overall Cognitive Status: History of cognitive impairments - at baseline Area of Impairment: Attention, Memory, Following commands, Safety/judgement, Awareness, Problem solving                   Current Attention Level:  Selective Memory: Decreased short-term memory, Decreased recall of precautions Following Commands: Follows one step commands with increased time Safety/Judgement: Decreased awareness of safety, Decreased awareness of deficits Awareness: Intellectual Problem Solving: Slow processing, Difficulty sequencing, Requires verbal cues, Requires tactile cues General Comments: Pt needed frequent  verbal/tactile cues for correct technique for transfers and use of walker        Exercises      General Comments General comments (skin integrity, edema, etc.): VSS on RA. Wife present for session and concerned about decline in mobility      Pertinent Vitals/Pain Pain Assessment Pain Assessment: Faces Faces Pain Scale: Hurts even more Pain Location: lt hip with sit to stand and gait Pain Descriptors / Indicators: Aching Pain Intervention(s): Limited activity within patient's tolerance, Repositioned, Premedicated before session    Home Living                          Prior Function            PT Goals (current goals can now be found in the care plan section) Progress towards PT goals: Not progressing toward goals - comment    Frequency    Min 3X/week      PT Plan Discharge plan needs to be updated    Co-evaluation              AM-PAC PT "6 Clicks" Mobility   Outcome Measure  Help needed turning from your back to your side while in a flat bed without using bedrails?: A Little Help needed moving from lying on your back to sitting on the side of a flat bed without using bedrails?: A Little Help needed moving to and from a bed to a chair (including a wheelchair)?: A Lot Help needed standing up from a chair using your arms (e.g., wheelchair or bedside chair)?: Total Help needed to walk in hospital room?: A Little Help needed climbing 3-5 steps with a railing? : Total 6 Click Score: 13    End of Session Equipment Utilized During Treatment: Gait belt Activity Tolerance: Patient limited by fatigue;Patient limited by pain Patient left: in chair;with call bell/phone within reach;with chair alarm set;with family/visitor present Nurse Communication: Mobility status PT Visit Diagnosis: Other abnormalities of gait and mobility (R26.89);Unsteadiness on feet (R26.81)     Time: 6283-1517 PT Time Calculation (min) (ACUTE ONLY): 21 min  Charges:  $Gait  Training: 23-37 mins                     [place] Pager [phone] Office [place] 12/08/2021, 12:48 PM

## 2021-12-08 NOTE — Telephone Encounter (Signed)
This encounter was created in error - please disregard.

## 2021-12-08 NOTE — NC FL2 (Signed)
[place] LEVEL OF CARE SCREENING TOOL     IDENTIFICATION  Patient Name: Robert Warner Birthdate: Jan 29, 1941 Sex: male Admission Date (Current Location): 12/04/2021  [facility] and [place] Number:  Herbalist and Address:  [facility] [place]. [facility], [place] [address] [place], [place] [address]      Provider Number: [id number]  Attending Physician Name and Address:  Elgergawy, [name], MD  Relative Name and Phone Number:       Current Level of Care: Hospital Recommended Level of Care: [place] Prior Approval Number:    Date Approved/Denied:   PASRR Number: [id number]  Discharge Plan: SNF    Current Diagnoses: Patient Active Problem List   Diagnosis Date Noted   Bacteremia due to Klebsiella pneumoniae 12/07/2021   DVT in Left peroneal vein 12/07/2021   CVA (cerebral vascular accident) ([place]) 12/06/2021   AKI (acute kidney injury) ([place]) 12/04/2021   Hyponatremia 12/04/2021   Cancer with unknown primary site [facility]) 10/11/2021   Goals of care, counseling/discussion 10/11/2021   Chest pain 09/21/2021   Recent cerebrovascular accident (CVA) 09/21/2021   Interatrial cardiac shunt 51/76/1607   Acute embolic stroke ([place]) 37/08/6268   Mixed diabetic hyperlipidemia associated with type 2 diabetes mellitus ([place]) 09/16/2021   Liver masses 09/16/2021   Essential hypertension 03/12/2012   Type 2 diabetes mellitus with hyperglycemia ([place]) 06/15/2008   BPH (benign prostatic hyperplasia) 06/15/2008    Orientation RESPIRATION BLADDER Height & Weight     Self, Time, Situation, Place  Normal Incontinent, External catheter Weight: 180 lb 1.9 oz (81.7 kg) Height:  5\' 10"  (177.8 cm)  BEHAVIORAL SYMPTOMS/MOOD NEUROLOGICAL BOWEL NUTRITION STATUS      Continent Diet (see dc summary)  AMBULATORY STATUS COMMUNICATION OF NEEDS Skin   Extensive Assist Verbally Normal                       Personal Care Assistance Level of Assistance   Bathing, Feeding, Dressing Bathing Assistance: Maximum assistance Feeding assistance: Limited assistance Dressing Assistance: Limited assistance     Functional Limitations Info  Sight Sight Info: Impaired        SPECIAL CARE FACTORS FREQUENCY  PT (By licensed PT), OT (By licensed OT)     PT Frequency: 5x/week OT Frequency: 5x/week            Contractures Contractures Info: Not present    Additional Factors Info  Insulin Sliding Scale Code Status Info: Full Allergies Info: NKA   Insulin Sliding Scale Info: see dc summary       Current Medications (12/08/2021):  This is the current hospital active medication list Current Facility-Administered Medications  Medication Dose Route Frequency Provider Last Rate Last Admin   acetaminophen (TYLENOL) tablet 650 mg  650 mg Oral Q4H PRN Etta Quill, DO   650 mg at 12/08/21 4854   Or   acetaminophen (TYLENOL) 160 MG/5ML solution 650 mg  650 mg Per Tube Q4H PRN Etta Quill, DO       Or   acetaminophen (TYLENOL) suppository 650 mg  650 mg Rectal Q4H PRN Etta Quill, DO       apixaban Arne [place]) tablet 5 mg  5 mg Oral BID Elgergawy, [name], MD   5 mg at 12/08/21 6270   atorvastatin (LIPITOR) tablet 40 mg  40 mg Oral Daily Jennette Kettle M, DO   40 mg at 12/08/21 3500   ceFAZolin (ANCEF) IVPB 2g/100 mL premix  2 g Intravenous Eldred Manges, MD 200 mL/hr at 12/08/21 0557 2 g at 12/08/21 0557   Chlorhexidine Gluconate Cloth 2 % PADS 6 each  6 each Topical Daily Etta Quill, DO   6 each at 12/08/21 0956   insulin aspart (novoLOG) injection 0-15 Units  0-15 Units Subcutaneous TID WC Etta Quill, DO   5 Units at 12/08/21 1561   [START ON 12/09/2021] insulin glargine-yfgn (SEMGLEE) injection 25 Units  25 Units Subcutaneous Daily Elgergawy, [name], MD       magnesium hydroxide (MILK OF MAGNESIA) suspension 30 mL  30 mL Oral Daily PRN Elgergawy, [name], MD   30 mL at 12/07/21 1608   polyethylene glycol  (MIRALAX / GLYCOLAX) packet 34 g  34 g Oral BID Elgergawy, [name], MD   34 g at 12/08/21 5379   sodium chloride flush (NS) 0.9 % injection 10-40 mL  10-40 mL Intracatheter PRN Etta Quill, DO   10 mL at 12/06/21 0407   tamsulosin (FLOMAX) capsule 0.4 mg  0.4 mg Oral QPC supper Elgergawy, [name], MD   0.4 mg at 12/07/21 1805     Discharge Medications: Please see discharge summary for a list of discharge medications.  Relevant Imaging Results:  Relevant Lab Results:   Additional Information SSN: [id number]. PFIZER Comrnaty(Gray TOP) Covid-19 Vaccine 03/09/2021  Pfizer COVID-19 Vaccine 08/17/2020 , 12/17/2019 , 11/26/2019  Pfizer Covid-19 Vaccine Bivalent Booster 10/12/2021  Benard Halsted, LCSW

## 2021-12-08 NOTE — Consult Note (Signed)
[facility] Inpatient Consult   12/08/2021  Robert Warner 01-31-1941 [id number]  [place] Management [facility] CM)  Referral received from inpatient diabetes coordinator for post hospital diabetes education with [facility] CM RN case management team. Patient has high risk score for unplanned readmission.  Patient is currently active with chronic care management team at primary provider office pharmacy team.   Plan: Will continue to follow for progression and disposition.   Of note, [facility] Care Management services does not replace or interfere with any services that are arranged by inpatient case management or social work.   Netta Cedars, MSN, RN [facility] [employer] [phone]  Toll free office [phone]

## 2021-12-08 NOTE — TOC Progression Note (Signed)
Transition of Care [facility]) - Progression Note    Patient Details  Name: Robert Warner MRN: [id number] Date of Birth: [birth year]  Transition of Care [facility]) CM/SW [place], LCSW Phone Number: 12/08/2021, 1:34 PM  Clinical Narrative:    CSW received consult that patient now requires SNF placement. CSW received consult for possible SNF placement at time of discharge. CSW spoke with patient and spouse at bedside. Patient reported that patient's spouse is currently unable to care for patient at their home given patients current physical needs and fall risk. Patient expressed understanding of PT recommendation and is agreeable to SNF placement at time of discharge. Patient reports preference for [facility] however they do not have any beds coming available. CSW discussed insurance authorization process and provided Medicare SNF ratings list and bed offers. Patient has received COVID vaccines.   Patient's spouse has selected [facility] and agrees to pay out of pocket for extra cost of having a private room. CSW requested [facility] begin insurance authorization process with [employer] (not [employer]).  Skilled Nursing Rehab Facilities-   [email or web address]   Ratings out of 5 possible   Name Address  Phone # [place] Inspection Overall  [facility] [address] [place] 5 5 2 4   [facility]  [address], [place] [phone] 4 2 5 5   [facility] [place], [place] 4 1 1 1   [place], [place] 2 2 4 4   [facility] [address], [place] 2 1 2 1   [place] [place] 3 1 4 3   [facility] [address] [place] 5 2 2 3   [facility] [address], [place] 4 1 2 1   [address] ([place]) [place], [place]  [phone] 5 1 2 2   [facility] [phone] [address], [place] [phone] 4 1 1 1   [facility] [address], [facility] [phone] 4 1 2 1   [facility] ([place]) [address] [place], [place] [phone] 4 1 1 1           [place], [place] [address] [place] 4 2 3 3   [facility] [place], [place] 3 1 5 4   [facility], [place] [address], [place] [phone] 2 1 1 1   [facility] [address] [phone] 2 2 3 3           [address] (no [facility]) [address], [place] [phone] 4 5 5 5   [facility] (No Humana) [address] [phone] [address], [place] 4 1 4 3   [facility] (No UHC) [place], [place] 5 5 5 5   [facility] [address] [place] [phone] 3 3 4 4   [place] [address], [place]  [phone] 2 2 2 2   [phone] [address] [place] 3 1 3 2   [facility] [address], [place] 1 1 2 1   [place] [address], [address] [place] 2 1 1 1   [place], [place] 5 2 4 5   [facility] [address] [place] 2 1 1 1   [facility] [address] [place] 3 1 1 1   [facility] [address], [place] [phone] 2 1 2 1           [facility] [place] [address], [place] [phone] 5 3 3 4   [place] [address] [place] 2 1 1 1   [place] (No Humana) [address] [place], [place] 2 1 2 1   [facility] [place] [address] [place] [phone] 3 1 1 1           [facility] [place], [place] 4 1 5 4   [facility] [place], [place] 2 1 3 2    [facility]) [place] [address], [place] 3 1 4 3   [facility] [address], [place] 4 1 4 3   [address] [place], [place] 3 3 1 1   [facility] [address] [place] [phone] 3 2 3 3       Expected Discharge Plan: [place] Barriers to Discharge: Continued Medical Work up, Ship broker  Expected Discharge Plan and Services Expected Discharge Plan: [place] In-house Referral: Clinical Social Work Discharge Planning Services: CM Consult Post Acute Care Choice: [place] Living arrangements for the past 2 months: Single Family Home                           HH Arranged: PT, OT, Speech Therapy HH Agency: [place] Date [place]: 12/06/21 Time HH Agency Contacted: 1400 Representative spoke with at [place]: [place] ([place]) Interventions    Readmission Risk Interventions No flowsheet data found.

## 2021-12-08 NOTE — TOC Benefit Eligibility Note (Signed)
Patient Advocate Encounter  Insurance verification completed.    The patient is currently admitted and upon discharge could be taking Eliquis 5 mg.  The current 30 day co-pay is, $45.00.   The patient is insured through [employer] Gold Medicare Part D     Lynnmarie Lovett, CPhT Pharmacy Patient Advocate Specialist [facility] Patient Advocate Team Direct Number: [phone]  Fax: [phone]        

## 2021-12-08 NOTE — Plan of Care (Signed)

## 2021-12-08 NOTE — Addendum Note (Signed)
Addended by: Oswaldo Done on: 5/73/2202 02:51 PM   Modules accepted: Orders, Level of Service

## 2021-12-08 NOTE — Discharge Instructions (Signed)
Information on my medicine - ELIQUIS (apixaban)  This medication education was reviewed with me or my healthcare representative as part of my discharge preparation.  T  Why was Eliquis prescribed for you? Eliquis was prescribed to treat blood clots that may have been found in the veins of your legs (deep vein thrombosis) or in your lungs (pulmonary embolism) and to reduce the risk of them occurring again.  What do You need to know about Eliquis ? The dose is ONE 5 mg tablet taken TWICE daily.  Eliquis may be taken with or without food.   Try to take the dose about the same time in the morning and in the evening. If you have difficulty swallowing the tablet whole please discuss with your pharmacist how to take the medication safely.  Take Eliquis exactly as prescribed and DO NOT stop taking Eliquis without talking to the doctor who prescribed the medication.  Stopping may increase your risk of developing a new blood clot.  Refill your prescription before you run out.  After discharge, you should have regular check-up appointments with your healthcare provider that is prescribing your Eliquis.    What do you do if you miss a dose? If a dose of ELIQUIS is not taken at the scheduled time, take it as soon as possible on the same day and twice-daily administration should be resumed. The dose should not be doubled to make up for a missed dose.  Important Safety Information A possible side effect of Eliquis is bleeding. You should call your healthcare provider right away if you experience any of the following: ? Bleeding from an injury or your nose that does not stop. ? Unusual colored urine (red or dark brown) or unusual colored stools (red or black). ? Unusual bruising for unknown reasons. ? A serious fall or if you hit your head (even if there is no bleeding).  Some medicines may interact with Eliquis and might increase your risk of bleeding or clotting while on Eliquis. To help  avoid this, consult your healthcare provider or pharmacist prior to using any new prescription or non-prescription medications, including herbals, vitamins, non-steroidal anti-inflammatory drugs (NSAIDs) and supplements.  This website has more information on Eliquis (apixaban): http://www.eliquis.com/eliquis/home  

## 2021-12-08 NOTE — Telephone Encounter (Signed)
PA sent to cover my meds.   [facility] - PA Case ID: [id number]

## 2021-12-08 NOTE — Progress Notes (Addendum)
[facility] for Infectious Disease    Date of Admission:  12/04/2021   Total days of antibiotics 4/day 2 cefazolin           ID: Robert Warner is a 81 y.o. male with   Principal Problem:   Acute embolic stroke ([place]) Active Problems:   Type 2 diabetes mellitus with hyperglycemia ([place])   Essential hypertension   Recent cerebrovascular accident (CVA)   Interatrial cardiac shunt   Cancer with unknown primary site ([place])   AKI (acute kidney injury) ([place])   Hyponatremia   CVA (cerebral vascular accident) ([place])   Bacteremia due to Klebsiella pneumoniae   DVT in Left peroneal vein    Subjective: Afebrile. Tolerating abtx. Denies diarrhea or rash  Medications:   apixaban  5 mg Oral BID   atorvastatin  40 mg Oral Daily   Chlorhexidine Gluconate Cloth  6 each Topical Daily   insulin aspart  0-15 Units Subcutaneous TID WC   [START ON 12/09/2021] insulin glargine-yfgn  25 Units Subcutaneous Daily   polyethylene glycol  34 g Oral BID   tamsulosin  0.4 mg Oral QPC supper    Objective: Vital signs in last 24 hours: Temp:  [98 F (36.7 C)-98.7 F (37.1 C)] 98 F (36.7 C) (01/26 1609) Pulse Rate:  [74-98] 83 (01/26 1609) Resp:  [15-20] 17 (01/26 1609) BP: (122-141)/(82-92) 122/85 (01/26 1609) SpO2:  [93 %-97 %] 97 % (01/26 1609)  Physical Exam  Constitutional: He is oriented to person, place, and time. He appears well-developed and well-nourished. No distress.  HENT:  Mouth/Throat: Oropharynx is clear and moist. No oropharyngeal exudate.  Cardiovascular: Normal rate, regular rhythm and normal heart sounds. Exam reveals no gallop and no friction rub.  No murmur heard.  Chest wall = no tenderness to port site Pulmonary/Chest: Effort normal and breath sounds normal. No respiratory distress. He has no wheezes.  Abdominal: Soft. Bowel sounds are normal. He exhibits no distension. There is no tenderness.  Lymphadenopathy:  He has no cervical adenopathy.  Neurological: He is  alert and oriented to person, place, and time.  Skin: Skin is warm and dry. No rash noted. No erythema.  Psychiatric: He has a normal mood and affect. His behavior is normal.    Lab Results Recent Labs    12/07/21 0440 12/08/21 0315  WBC 5.3 3.7*  HGB 10.7* 10.5*  HCT 31.4* 30.9*  NA 127* 131*  K 3.8 4.0  CL 98 101  CO2 24 25  BUN 14 16  CREATININE 1.15 1.09   Liver Panel Recent Labs    12/07/21 0440 12/08/21 0315  PROT 4.7* 4.8*  ALBUMIN 2.0* 2.0*  AST 58* 69*  ALT 28 32  ALKPHOS 67 71  BILITOT 0.7 0.6   Sedimentation Rate No results for input(s): ESRSEDRATE in the last 72 hours. C-Reactive Protein Recent Labs    12/07/21 0440 12/08/21 0315  CRP 10.7* 9.6*    Microbiology: 1/23 blood cx kleb 1/25 blood cx ngtd Studies/Results: No results found.   Assessment/Plan: Kelbsiella bacteremia = likely GI source. Plan for a 14 day course of therapy. Currently on cefazolin while hospitalized and will discharge on cefadroxil to complete course. If repeat blood cx are still persistent will plan on port removal and TEE.   Hx of acute CVA = thought to be recent dvt with presence of PFO, undergoing anticoagulation to reach goal.  Abn transaminitis = 2/2 GB malignancy with hepatic mets  Will arrange for follow  up in the ID clinic in 2 wk for surveillance blood cx and follow up on treatment  [facility] for Infectious Diseases Pager: [phone]  12/08/2021, 4:43 PM

## 2021-12-08 NOTE — Telephone Encounter (Signed)
Working on the [place]. Below is the list of alternatives in case PA is denied.   HumaLOG KwikPen 200UNIT/ML pen-injectors Required Fiasp FlexTouch U-100 Insulin Not Required Lantus Solostar U-100 Insulin Not Required Novolog FlexPen U-100 Insulin Not Required Novolog PenFill U-100 Insulin Not Required Novolog U-100 Insulin Aspart Not Required Admelog SoloStar U-100 Insulin Required Insulin Aspart U-100 (Inpn) Required Insulin Lispro (Inph) Required Lyumjev KwikPen U-100 Insulin Required Lyumjev Tempo Pen(U-100)Insuln Required

## 2021-12-08 NOTE — Patient Outreach (Signed)
[facility]) Care Management  12/08/2021  ISAIR INABINET 1941-09-23 [id number]   Referral received from [facility] inpatient RN for Care Management services to assess as patient has a1c >11%.  Assigned to [place] [facility] 12/08/21.  Ina Homes [facility] Management Assistant [phone]

## 2021-12-08 NOTE — Progress Notes (Signed)
PROGRESS NOTE                                                                                                                                                                                                             Patient Demographics:    Robert Warner, is a 81 y.o. male, DOB - [birth year], MWN:[id number]  Outpatient Primary MD for the patient is [name], MD    LOS - 2  Admit date - 12/04/2021    CC -poor balance and difficulty in walking     Brief Narrative (HPI from H&P) -   Robert Warner is a 81 y.o. 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  He presented to the ER with dizziness and poor balance.  He was admitted for further stroke work-up.  's work-up was suggestive of embolic stroke in the presence of right-to-left shunt, new DVT and Klebsiella bacteremia.  CT scan now suggest possible underlying gallbladder malignancy.  GI, oncology and neurology have been consulted   Subjective:   Patient still reporting constipation, no further urinary retention.   Assessment  & Plan :    Progression of embolic appearing small infarcts scattered in the bilateral cerebral and cerebellar hemispheres since November. Many such infarcts demonstrate petechial hemorrhage. Small acute infarcts are identified in the right cerebellum, left splenium and there are multiple subacute appearing punctate white matter infarcts - he has recent history of embolic infarcts in November 2022 with work-up positive for right-to-left shunt.  He was on Plavix since November 2022.  He was currently on Plavix and statin combination for secondary prevention, and had a breakthrough  stroke again this admission, seen by neurology and stroke team, with his history of left to right shunt lower extremity venous duplex was ordered which was positive for acute DVT.  He is now on heparin drip for close monitoring in the light of his underlying acute strokes with mild petechial conversion.  Patient will be transition to Eliquis today at 5 mg p.o. twice daily.  2.  Acute on chronic left hip pain fall at home.  Labile with supportive care CT hip unremarkable.  3.  Hx of metastatic cancer with unknown primary under the care of [name].  He finished his fourth cycle of FOLFOX on 11/30/2020.  CT scan done this admission now suggestive of possible gallbladder malignancy.  GI and [name] both consulted on 12/06/2021  4.  Klebsiella bacteremia incidental finding on 12/05/2020.  Initially on IV Rocephin, this has been narrowed to IV cefazolin, repeat blood cultures remain negative, antibiotics management per ID, as long cultures are negative will need total of 2 weeks treatment, IV cefazolin during hospital stay, then can be transitioned to oral on discharge, if repeat cultures are positive, then port need to be discontinued and she will need TEE . -ID will follow as an outpatient regarding repeat cultures once antibiotic treatment has finished . - ID  planning on transitioning from cefazolin to cefadroxil 1 g PO BID and treating for 10 more days once he is discharged  5.  Known history of left shunt.  Patient is currently on anticoagulation.  6.  Hyponatremia.  Initially due to dehydration now getting worse on IV fluids, stop IV fluids check serum and urine osmolality along with urine sodium, gentle low-dose one-time oral Lasix on 12/06/2021 after urine sample is obtained.  7.  AKI.  Resolved after hydration with IV fluids.  8 .  Hypertension.  For permissive hypertension due to acute CVA.    9. DM type II.  Lantus and sliding scale.   Lab Results  Component Value Date    HGBA1C 11.9 (H) 12/04/2021   CBG (last 3)  Recent Labs    12/07/21 2015 12/08/21 0810 12/08/21 1238  GLUCAP 288* 213* 196*         Condition - Extremely Guarded  Family Communication  : Discussed with wife and daughter at bedside daily  Code Status :  Full  Consults  :  Neuro, GI, oncology  PUD Prophylaxis :     Procedures  :     MRI - 1. No contrast administered, limiting evaluation for metastatic disease. 2. Progression of embolic appearing small infarcts scattered in the bilateral cerebral and cerebellar hemispheres since November. Many such infarcts demonstrate petechial hemorrhage (including confluent new petechial hemorrhage in the left cerebellum), but no malignant hemorrhagic transformation or intracranial mass effect. Small acute infarcts are identified in the right cerebellum, left splenium. And there are multiple subacute appearing punctate white matter infarcts. 3. Underlying advanced but nonspecific signal changes in the cerebral white matter and pons possibly due to chronic small vessel disease.  CTA - 1. CT perfusion negative for acute infarct or ischemia 2. Negative for intracranial large vessel occlusion 3. No carotid or vertebral artery stenosis in the neck. No significant atherosclerotic disease. 4. CT head demonstrates new hypodensity right mid cerebellum. This could represent interval acute infarct or possibly metastatic disease given history. Follow-up MRI brain without and with contrast recommended. Additional chronic ischemic changes stable from prior studies. 5. 8.5 mm spiculated nodule right upper lobe, stable from prior PET-CT 10/10/2021       Disposition Plan  : After PT evaluation today, recommendation for SNF placement.  Status is: Inpatient    DVT Prophylaxis  :  Hep gtt>> Eliquis    Lab Results  Component Value Date   PLT 64 (L) 12/08/2021    Diet :  Diet Order             Diet Carb Modified Fluid consistency: Thin; Room service  appropriate? No  Diet effective now                    Inpatient Medications  Scheduled Meds:  apixaban  5 mg Oral BID   atorvastatin  40 mg Oral Daily   Chlorhexidine Gluconate Cloth  6 each Topical Daily   insulin aspart  0-15 Units Subcutaneous TID WC   [START ON 12/09/2021] insulin glargine-yfgn  25 Units Subcutaneous Daily   polyethylene glycol  34 g Oral BID   tamsulosin  0.4 mg Oral QPC supper   Continuous Infusions:   ceFAZolin (ANCEF) IV 2 g (12/08/21 1300)   PRN Meds:.acetaminophen **OR** acetaminophen (TYLENOL) oral liquid 160 mg/5 mL **OR** acetaminophen, magnesium hydroxide, sodium chloride flush  Antibiotics  :    Anti-infectives (From admission, onward)    Start     Dose/Rate Route Frequency Ordered Stop   12/10/21 0000  cefadroxil (DURICEF) 500 MG capsule       Note to Pharmacy: Patient planned to discharge either 1/27 or 1/28   1,000 mg Oral 2 times daily 12/08/21 1138 12/19/21 2359   12/07/21 1400  ceFAZolin (ANCEF) IVPB 2g/100 mL premix        2 g 200 mL/hr over 30 Minutes Intravenous Every 8 hours 12/07/21 0956     12/05/21 1430  cefTRIAXone (ROCEPHIN) 2 g in sodium chloride 0.9 % 100 mL IVPB  Status:  Discontinued        2 g 200 mL/hr over 30 Minutes Intravenous Every 24 hours 12/05/21 1339 12/07/21 0956          Emeline Gins Iva Montelongo M.D on 12/08/2021 at 2:04 PM  To page go to [email or web address]   [facility] -  Office  [phone]  See all Orders from today for further details    Objective:   Vitals:   12/08/21 0400 12/08/21 0838 12/08/21 1100 12/08/21 1240  BP: (!) 133/92 (!) 141/92  125/82  Pulse: 94 90  74  Resp: 15 18  17   Temp: 98.7 F (37.1 C) 98.2 F (36.8 C)  98 F (36.7 C)  TempSrc: Oral Oral  Oral  SpO2: 93%  97% 97%  Weight:      Height:        Wt Readings from Last 3 Encounters:  12/04/21 81.7 kg  12/02/21 81.4 kg  11/30/21 79.9 kg     Intake/Output Summary (Last 24 hours) at 12/08/2021 1404 Last data filed  at 12/08/2021 0557 Gross per 24 hour  Intake 1195 ml  Output 1800 ml  Net -605 ml     Physical Exam  Awake Alert, Oriented X 3, No new F.N deficits, Normal affect Symmetrical Chest wall movement, Good air movement bilaterally, CTAB RRR,No Gallops,Rubs or new Murmurs, No Parasternal Heave +ve B.Sounds, Abd Soft, No tenderness, No rebound - guarding or rigidity. No Cyanosis, Clubbing or edema, No new Rash or bruise         Data Review:    CBC Recent Labs  Lab 12/04/21 1309 12/04/21 1340 12/05/21 0425 12/06/21 0404 12/07/21 0440 12/08/21 0315  WBC 4.9  --  5.1 8.6 5.3 3.7*  HGB 11.3* 12.2* 11.2* 10.9* 10.7* 10.5*  HCT 33.8* 36.0* 32.7* 31.4* 31.4* 30.9*  PLT 75*  --  56* 56* 60* 64*  MCV 84.3  --  83.8 84.0 84.2 83.7  MCH 28.2  --  28.7 29.1 28.7 28.5  MCHC 33.4  --  34.3 34.7 34.1 34.0  RDW 14.9  --  14.9 14.9  14.9 14.9  LYMPHSABS 0.2*  --   --  1.3 1.4 1.3  MONOABS 0.0*  --   --  0.3 0.3 0.4  EOSABS 0.0  --   --  0.1 0.1 0.1  BASOSABS 0.0  --   --  0.0 0.0 0.0    Electrolytes Recent Labs  Lab 12/04/21 1309 12/04/21 1340 12/04/21 1503 12/04/21 2257 12/05/21 0037 12/05/21 0254 12/05/21 0425 12/05/21 0645 12/06/21 0404 12/07/21 0440 12/08/21 0315  NA 130*   < >  --  133*  --   --  133*  --  130* 127* 131*  K 4.5   < >  --  4.1  --   --  3.6  --  3.5 3.8 4.0  CL 93*  --   --  95*  --   --  99  --  101 98 101  CO2 22  --   --  26  --   --  25  --  24 24 25   GLUCOSE 467*  --   --  290*  --   --  150*  --  93 156* 224*  BUN 42*  --   --  34*  --   --  31*  --  19 14 16   CREATININE 2.07*  --   --  1.54*  --   --  1.42*  --  1.07 1.15 1.09  CALCIUM 8.8*  --   --  8.7*  --   --  8.2*  --  7.8* 7.8* 7.9*  AST 31  --   --   --   --   --   --   --  59* 58* 69*  ALT 21  --   --   --   --   --   --   --  24 28 32  ALKPHOS 73  --   --   --   --   --   --   --  70 67 71  BILITOT 0.8  --   --   --   --   --   --   --  0.7 0.7 0.6  ALBUMIN 3.2*  --   --   --   --   --    --   --  2.1* 2.0* 2.0*  MG  --   --   --   --   --   --   --  1.6* 1.9 1.7 1.6*  CRP  --   --  11.9*  --   --   --   --  16.3* 14.8* 10.7* 9.6*  PROCALCITON  --   --   --   --   --   --   --  40.42 26.49 17.97  --   LATICACIDVEN  --   --   --   --  4.2* 1.8  --   --   --   --   --   INR 1.2  --   --   --   --   --   --   --   --   --   --   TSH  --   --  0.465  --   --   --   --   --   --   --   --   HGBA1C  --   --   --  11.9*  --   --   --   --   --   --   --  BNP  --   --   --   --   --   --   --  181.6* 111.6* 80.9 56.0   < > = values in this interval not displayed.    ------------------------------------------------------------------------------------------------------------------ No results for input(s): CHOL, HDL, LDLCALC, TRIG, CHOLHDL, LDLDIRECT in the last 72 hours.   Lab Results  Component Value Date   HGBA1C 11.9 (H) 12/04/2021    No results for input(s): TSH, T4TOTAL, T3FREE, THYROIDAB in the last 72 hours.  Invalid input(s): FREET3  ------------------------------------------------------------------------------------------------------------------ ID Labs Recent Labs  Lab 12/04/21 1309 12/04/21 1503 12/04/21 2257 12/05/21 0037 12/05/21 0254 12/05/21 0425 12/05/21 0645 12/06/21 0404 12/07/21 0440 12/08/21 0315  WBC 4.9  --   --   --   --  5.1  --  8.6 5.3 3.7*  PLT 75*  --   --   --   --  56*  --  56* 60* 64*  CRP  --  11.9*  --   --   --   --  16.3* 14.8* 10.7* 9.6*  PROCALCITON  --   --   --   --   --   --  40.42 26.49 17.97  --   LATICACIDVEN  --   --   --  4.2* 1.8  --   --   --   --   --   CREATININE 2.07*  --  1.54*  --   --  1.42*  --  1.07 1.15 1.09   Cardiac Enzymes No results for input(s): CKMB, TROPONINI, MYOGLOBIN in the last 168 hours.  Invalid input(s): CK  Radiology Reports MR BRAIN WO CONTRAST  Result Date: 12/05/2021 CLINICAL DATA:  81 year old male acute neurologic deficit. Left side weakness. Metastatic disease unknown primary.  Widely scattered small embolic appearing cerebral and cerebellar infarcts in November. New cerebellar hypodensity on CT yesterday. EXAM: MRI HEAD WITHOUT CONTRAST TECHNIQUE: Multiplanar, multiecho pulse sequences of the brain and surrounding structures were obtained without intravenous contrast. COMPARISON:  CTA head and neck yesterday.  Brain MRI 09/16/2021. FINDINGS: Brain: Mostly expected evolution of the scattered small bilateral cerebral and cerebellar infarcts seen in November, including in the right thalamus. But petechial hemorrhagic conversion of multiple bilateral cerebellar infarcts since that time, including confluent petechial hemorrhage in the left hemisphere on series 7, image 23 with faint associated cerebellar edema. And multiple such small hemorrhagic infarcts are new since November but nonacute. No contrast administered. On DWI there is a small contralateral central right cerebellar area of diffusion restriction now (series 2, image 11). a and a small focus of white matter restricted diffusion in the left splenium (series 2, image 28) with T2 and FLAIR hyperintensity. Also questionable punctate restricted diffusion in the left occipital pole white matter, although may be susceptibility artifact from chronic microhemorrhage there also. Furthermore, there are several small bilateral frontal and parietal lobe central white matter foci of abnormal diffusion, although most appear facilitated on ADC (series 2, image 33). Fewer new but chronic microhemorrhages scattered in the supratentorial brain. Underlying confluent bilateral cerebral white matter T2 and FLAIR hyperintensity which is advanced but nonspecific. Stable similar T2 and FLAIR hyperintensity in the pons. Small areas of developing cortical encephalomalacia corresponding to interval small cortical infarcts such as medial superior right parietal lobe series 6, image 29. No midline shift, obvious mass lesion, ventriculomegaly, extra-axial  collection or acute intracranial hemorrhage. Cervicomedullary junction and pituitary are within normal limits. Vascular: Major intracranial vascular flow voids are stable. Skull  and upper cervical spine: Heterogeneous bone marrow signal is indeterminate in the visible cervical spine, not definitely progressed since November. No destructive osseous lesion is identified. Sinuses/Orbits: Stable, negative. Other: Mastoids are clear. Visible internal auditory structures appear normal. IMPRESSION: 1. No contrast administered, limiting evaluation for metastatic disease. 2. Progression of embolic appearing small infarcts scattered in the bilateral cerebral and cerebellar hemispheres since November. Many such infarcts demonstrate petechial hemorrhage (including confluent new petechial hemorrhage in the left cerebellum), but no malignant hemorrhagic transformation or intracranial mass effect. Small acute infarcts are identified in the right cerebellum, left splenium. And there are multiple subacute appearing punctate white matter infarcts. 3. Underlying advanced but nonspecific signal changes in the cerebral white matter and pons possibly due to chronic small vessel disease. Electronically Signed   By: Genevie Ann M.D.   On: 12/05/2021 09:28   CT ABDOMEN PELVIS W CONTRAST  Result Date: 12/05/2021 CLINICAL DATA:  Abdominal pain, acute, nonlocalized. Metastatic cancer of unknown primary. EXAM: CT ABDOMEN AND PELVIS WITH CONTRAST TECHNIQUE: Multidetector CT imaging of the abdomen and pelvis was performed using the standard protocol following bolus administration of intravenous contrast. RADIATION DOSE REDUCTION: This exam was performed according to the departmental dose-optimization program which includes automated exposure control, adjustment of the mA and/or kV according to patient size and/or use of iterative reconstruction technique. CONTRAST:  111mL OMNIPAQUE IOHEXOL 300 MG/ML  SOLN COMPARISON:  PET CT 10/10/2021 FINDINGS:  Lower chest: No acute abnormality. Hepatobiliary: Multiple heterogeneously enhancing masses are again seen throughout both hepatic lobes with the dominant conglomerate within segment 5 of the liver similar to that noted on prior PET CT examination. The appearance is relatively stable when compared to prior MRI examination of 09/03/2021. Mild bilobar intrahepatic biliary ductal dilation appears stable since prior MRI examination. Main, right, and left portal veins are patent. The gallbladder appears intimately associated with the malignant masses within segment 5 of the liver and a primary gallbladder carcinoma could be considered. This, too, appears unchanged from prior examination. Pancreas: Unremarkable Spleen: Unremarkable Adrenals/Urinary Tract: The adrenal glands are unremarkable. The kidneys are normal in size and position. 7 cm simple cortical cyst noted within the upper pole of the right kidney. Hyperdensity within the upper pole calices of the right kidney and within the left renal pelvis likely represents excreted contrast. No hydronephrosis. No definite nephro or urolithiasis. The bladder is unremarkable. Stomach/Bowel: The stomach is unremarkable. The second portion the duodenum is in close proximity to the porta hepatis and cystic duct and there is infiltration of the periduodenal soft tissue which may relate to edema or infiltration related to the patient's primary malignancy. This is best appreciated on coronal image # 37/6 focal narrowing of the duodenum in this region may cicatricial change. The small and large bowel are unremarkable. The appendix is not clearly identified and may be absent. No free intraperitoneal gas or fluid. Reflect Vascular/Lymphatic: Aortic atherosclerosis. No enlarged abdominal or pelvic lymph nodes. Reproductive: The prostate gland is enlarged. Metallic densities within the prostate gland may relate to UroLift procedure. Other: Tiny fat containing umbilical hernia.  Musculoskeletal: No acute bone abnormality. No lytic or blastic bone lesion. IMPRESSION: Stable examination. Bilobar hepatic masses in keeping with metastatic disease with dominant conglomerate within segment 5 of the liver in close proximity to the adjacent gallbladder which appears contracted and demonstrates irregular wall thickening. 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 Infiltrative change within the porta hepatis noted with abutment of the second portion  of the duodenum without evidence of obstruction or fistula formation at this time. Stable mild intrahepatic biliary ductal dilation. No acute intra-abdominal process identified. Aortic Atherosclerosis (ICD10-I70.0). Electronically Signed   By: Fidela Salisbury M.D.   On: 12/05/2021 21:28   CT HIP LEFT WO CONTRAST  Result Date: 12/05/2021 CLINICAL DATA:  Hip pain. Infection suspected. X-ray done. Acute on chronic left hip pain. Fall at home. History of osteoarthritis. Evaluate for occult fracture. EXAM: CT OF THE LEFT HIP WITHOUT CONTRAST TECHNIQUE: Multidetector CT imaging of the left hip was performed according to the standard protocol. Multiplanar CT image reconstructions were also generated. RADIATION DOSE REDUCTION: This exam was performed according to the departmental dose-optimization program which includes automated exposure control, adjustment of the mA and/or kV according to patient size and/or use of iterative reconstruction technique. COMPARISON:  None available FINDINGS: Bones/Joint/Cartilage Moderate superior left femoroacetabular joint space narrowing. Mild left femoral head-neck junction circumferential degenerative osteophytes. Mild-to-moderate posterosuperior left femoral head-neck junction subchondral degenerative cystic change. Mild pubic symphysis joint space narrowing and peripheral osteophytosis degenerative change. No acute fracture is seen. No dislocation. No cortical erosion to suggest  osteomyelitis. Ligaments Suboptimally assessed by CT. Muscles and Tendons Unremarkable, within the limitations of this modality. Soft tissues No left hip joint effusion. There are metallic densities within the prostate, possibly brachytherapy seeds. The partially visualized prostate is moderately enlarged, impressing on the urinary bladder base. Mild posterior urinary bladder outpouching may represent a small diverticulum superior to the prostate (axial series 5, image 38). Mild fat containing left inguinal hernia. Mild-to-moderate L5-S1 disc space narrowing, vacuum phenomenon, and peripheral degenerative osteophytosis. IMPRESSION: 1. No acute fracture or CT evidence of osteomyelitis is seen. 2. Moderate left femoroacetabular osteoarthritis. 3. Mild pubic symphysis osteoarthritis. Electronically Signed   By: Yvonne Kendall M.D.   On: 12/05/2021 13:05   DG Chest Port 1 View  Result Date: 12/05/2021 CLINICAL DATA:  81 year old male with shortness of breath. Metastatic disease unknown primary. EXAM: PORTABLE CHEST 1 VIEW COMPARISON:  Chest radiographs 09/20/2021 and earlier. FINDINGS: Portable AP semi upright view at 0610 hours. Right chest power port is new since last year. Lower lung volumes. Mediastinal contours remain within normal limits. Visualized tracheal air column is within normal limits. Diffuse crowding of lung markings with no pneumothorax, pleural effusion, consolidation, or overt edema. No lung nodule identified. Visible osseous structures within normal limits. Paucity of bowel gas. IMPRESSION: Low lung volumes, otherwise no acute cardiopulmonary abnormality. Electronically Signed   By: Genevie Ann M.D.   On: 12/05/2021 06:42   VAS [place] LOWER EXTREMITY VENOUS (DVT)  Result Date: 12/06/2021  Lower Venous DVT Study Patient Name:  TREMELL REIMERS  Date of Exam:   12/05/2021 Medical Rec #: [id number]        Accession #:    [id number] Date of Birth: 1941/02/24        Patient Gender: M Patient Age:   64 years  Exam Location:  [facility] Procedure:      VAS [place] LOWER EXTREMITY VENOUS (DVT) Referring Phys: Jennette Kettle --------------------------------------------------------------------------------  Indications: Stroke.  Risk Factors: None identified. Comparison Study: No prior studies. Performing Technologist: Oliver Hum RVT  Examination Guidelines: A complete evaluation includes B-mode imaging, spectral Doppler, color Doppler, and power Doppler as needed of all accessible portions of each vessel. Bilateral testing is considered an integral part of a complete examination. Limited examinations for reoccurring indications may be performed as noted. The reflux portion of the exam is performed with  the patient in reverse Trendelenburg.  +---------+---------------+---------+-----------+----------+--------------+  RIGHT     Compressibility Phasicity Spontaneity Properties Thrombus Aging  +---------+---------------+---------+-----------+----------+--------------+  CFV       Full            Yes       Yes                                    +---------+---------------+---------+-----------+----------+--------------+  SFJ       Full                                                             +---------+---------------+---------+-----------+----------+--------------+  FV Prox   Full                                                             +---------+---------------+---------+-----------+----------+--------------+  FV Mid    Full                                                             +---------+---------------+---------+-----------+----------+--------------+  FV Distal Full                                                             +---------+---------------+---------+-----------+----------+--------------+  PFV       Full                                                             +---------+---------------+---------+-----------+----------+--------------+  POP       Full            Yes       Yes                                     +---------+---------------+---------+-----------+----------+--------------+  PTV       Full                                                             +---------+---------------+---------+-----------+----------+--------------+  PERO      Full                                                             +---------+---------------+---------+-----------+----------+--------------+   +---------+---------------+---------+-----------+----------+-----------------+  LEFT      Compressibility Phasicity Spontaneity Properties Thrombus Aging     +---------+---------------+---------+-----------+----------+-----------------+  CFV       Full            Yes       Yes                                       +---------+---------------+---------+-----------+----------+-----------------+  SFJ       Full                                                                +---------+---------------+---------+-----------+----------+-----------------+  FV Prox   Full                                                                +---------+---------------+---------+-----------+----------+-----------------+  FV Mid    Full                                                                +---------+---------------+---------+-----------+----------+-----------------+  FV Distal Full                                                                +---------+---------------+---------+-----------+----------+-----------------+  PFV       Full                                                                +---------+---------------+---------+-----------+----------+-----------------+  POP       Full            Yes       Yes                                       +---------+---------------+---------+-----------+----------+-----------------+  PTV       Full                                                                +---------+---------------+---------+-----------+----------+-----------------+  PERO      Partial  Age Indeterminate  +---------+---------------+---------+-----------+----------+-----------------+     Summary: RIGHT: - There is no evidence of deep vein thrombosis in the lower extremity.  - No cystic structure found in the popliteal fossa.  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 - No cystic structure found in the popliteal fossa.  *See table(s) above for measurements and observations. Electronically signed by Orlie Pollen on 12/06/2021 at 6:50:45 AM.    Final    ECHOCARDIOGRAM LIMITED  Result Date: 12/05/2021    ECHOCARDIOGRAM LIMITED REPORT   Patient Name:   MEIKO STRANAHAN Date of Exam: 12/05/2021 Medical Rec #:  [id number]       Height:       70.0 in Accession #:    [id number]      Weight:       180.1 lb Date of Birth:  09/26/1941       BSA:          1.996 m Patient Age:    44 years        BP:           144/89 mmHg Patient Gender: M               HR:           83 bpm. Exam Location:  Inpatient Procedure: Limited Echo, Limited Color Doppler and Cardiac Doppler Indications:    stroke  History:        Patient has prior history of Echocardiogram examinations, most                 recent 09/17/2021. Cancer; Risk Factors:Diabetes and                 Hypertension.  Sonographer:    Johny Chess RDCS Referring Phys: [place]  1. LV function decreased since 09/17/21 no strain imaging performed . Left ventricular ejection fraction, by estimation, is 50 to 55%. The left ventricle has low normal function. The left ventricle has no regional wall motion abnormalities. The left ventricular internal cavity size was mildly dilated. There is mild left ventricular hypertrophy. Left ventricular diastolic parameters are consistent with Grade I diastolic dysfunction (impaired relaxation).  2. Right ventricular systolic function is normal. The right ventricular size is normal.  3. The mitral valve is abnormal. Trivial mitral  valve regurgitation. No evidence of mitral stenosis.  4. The aortic valve is tricuspid. Aortic valve regurgitation is not visualized. No aortic stenosis is present.  5. Aortic dilatation noted. There is mild dilatation of the ascending aorta, measuring 39 mm.  6. The inferior vena cava is normal in size with greater than 50% respiratory variability, suggesting right atrial pressure of 3 mmHg. FINDINGS  Left Ventricle: LV function decreased since 09/17/21 no strain imaging performed. Left ventricular ejection fraction, by estimation, is 50 to 55%. The left ventricle has low normal function. The left ventricle has no regional wall motion abnormalities. The left ventricular internal cavity size was mildly dilated. There is mild left ventricular hypertrophy. Left ventricular diastolic parameters are consistent with Grade I diastolic dysfunction (impaired relaxation). Right Ventricle: The right ventricular size is normal. No increase in right ventricular wall thickness. Right ventricular systolic function is normal. Left Atrium: Left atrial size was normal in size. Right Atrium: Right atrial size was normal in size. Pericardium: There is no evidence of pericardial effusion. Mitral Valve: The mitral valve is abnormal. There is mild thickening of the mitral valve  leaflet(s). Trivial mitral valve regurgitation. No evidence of mitral valve stenosis. Tricuspid Valve: The tricuspid valve is normal in structure. Tricuspid valve regurgitation is mild . No evidence of tricuspid stenosis. Aortic Valve: The aortic valve is tricuspid. Aortic valve regurgitation is not visualized. No aortic stenosis is present. Pulmonic Valve: The pulmonic valve was normal in structure. Pulmonic valve regurgitation is not visualized. No evidence of pulmonic stenosis. Aorta: Aortic dilatation noted. There is mild dilatation of the ascending aorta, measuring 39 mm. Venous: The inferior vena cava is normal in size with greater than 50% respiratory  variability, suggesting right atrial pressure of 3 mmHg. IAS/Shunts: No atrial level shunt detected by color flow Doppler. LEFT VENTRICLE PLAX 2D LVIDd:         4.40 cm     Diastology LVIDs:         3.30 cm     LV e' medial:    3.92 cm/s LV PW:         1.00 cm     LV E/e' medial:  13.8 LV IVS:        1.30 cm     LV e' lateral:   6.74 cm/s                            LV E/e' lateral: 8.0  LV Volumes (MOD) LV vol d, MOD A2C: 75.9 ml LV vol s, MOD A2C: 47.1 ml LV SV MOD A2C:     28.8 ml IVC IVC diam: 1.40 cm LEFT ATRIUM         Index LA diam:    3.30 cm 1.65 cm/m  AORTIC VALVE LVOT Vmax:   81.10 cm/s LVOT Vmean:  55.700 cm/s LVOT VTI:    0.130 m  AORTA Ao Asc diam: 3.90 cm MITRAL VALVE MV Area (PHT): 2.96 cm    SHUNTS MV Decel Time: 256 msec    Systemic VTI: 0.13 m MV E velocity: 54.00 cm/s MV A velocity: 85.30 cm/s MV E/A ratio:  0.63 Jenkins Rouge MD Electronically signed by Jenkins Rouge MD Signature Date/Time: 12/05/2021/12:01:58 PM    Final      Patient ID: [name], male   DOB: 1941-05-12, 81 y.o.   MRN: [id number] No further urinary retention, still reporting constipation, no further urinary retention.  Patient ID: [name], male   DOB: 07-29-1941, 81 y.o.   MRN: [id number]

## 2021-12-09 ENCOUNTER — Other Ambulatory Visit (HOSPITAL_COMMUNITY): Payer: Self-pay

## 2021-12-09 LAB — RESP PANEL BY RT-PCR (FLU A&B, COVID) ARPGX2
Influenza A by PCR: NEGATIVE
Influenza B by PCR: NEGATIVE
SARS Coronavirus 2 by RT PCR: NEGATIVE

## 2021-12-09 LAB — GLUCOSE, CAPILLARY
Glucose-Capillary: 186 mg/dL — ABNORMAL HIGH (ref 70–99)
Glucose-Capillary: 189 mg/dL — ABNORMAL HIGH (ref 70–99)
Glucose-Capillary: 244 mg/dL — ABNORMAL HIGH (ref 70–99)
Glucose-Capillary: 320 mg/dL — ABNORMAL HIGH (ref 70–99)

## 2021-12-09 MED ORDER — TOUJEO MAX SOLOSTAR 300 UNIT/ML ~~LOC~~ SOPN: 25.0000 [IU] | PEN_INJECTOR | Freq: Every day | SUBCUTANEOUS | 1 refills | Status: DC

## 2021-12-09 MED ORDER — TRAMADOL HCL 50 MG PO TABS: 50.0000 mg | ORAL_TABLET | Freq: Four times a day (QID) | ORAL | 0 refills | Status: DC | PRN

## 2021-12-09 MED ORDER — SENNOSIDES-DOCUSATE SODIUM 8.6-50 MG PO TABS: 2.0000 | ORAL_TABLET | Freq: Two times a day (BID) | ORAL | Status: AC

## 2021-12-09 MED ORDER — POLYETHYLENE GLYCOL 3350 17 G PO PACK: 34.0000 g | PACK | Freq: Every day | ORAL | 0 refills | Status: AC | PRN

## 2021-12-09 MED ORDER — HEPARIN SOD (PORK) LOCK FLUSH 100 UNIT/ML IV SOLN
500.0000 [IU] | INTRAVENOUS | Status: AC | PRN
  Administered 2021-12-09: 500 [IU]
  Filled 2021-12-09: qty 5

## 2021-12-09 MED ORDER — APIXABAN 5 MG PO TABS: 5.0000 mg | ORAL_TABLET | Freq: Two times a day (BID) | ORAL | Status: DC

## 2021-12-09 MED ORDER — MAGNESIUM HYDROXIDE 400 MG/5ML PO SUSP
30.0000 mL | Freq: Once | ORAL | Status: AC
  Administered 2021-12-09: 30 mL via ORAL
  Filled 2021-12-09: qty 30

## 2021-12-09 MED ORDER — INSULIN ASPART 100 UNIT/ML IJ SOLN: 0.0000 [IU] | Freq: Three times a day (TID) | INTRAMUSCULAR | 11 refills | Status: DC

## 2021-12-09 NOTE — Discharge Summary (Addendum)
Physician Discharge Summary  Robert Warner MGQ:[id number] DOB: Nov 22, 1940 DOA: 12/04/2021  PCP: [name], MD  Admit date: 12/04/2021 Discharge date: 12/09/2021  Admitted From: Home Disposition:  SNF  Recommendations for Outpatient Follow-up:  Please obtain BMP/CBC in one week ID to arrange for outpatient follow-up at the conclusion of antibiotics to do surveillance culture.    Discharge Condition:Stable CODE STATUS:FULL Diet recommendation: Heart Healthy / Carb Modified   Brief/Interim Summary:   Robert Warner is a 81 y.o. 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  He presented to the ER with dizziness and poor balance.  He was admitted for further stroke work-up.   's work-up was suggestive of embolic stroke in the presence of right-to-left shunt, new DVT and Klebsiella bacteremia.  CT scan now suggest possible underlying gallbladder malignancy.  GI, oncology and neurology have been consulted  Acute CVA,: -Progression of embolic appearing small infarcts scattered in the bilateral cerebral and cerebellar hemispheres since November. Many such infarcts demonstrate petechial hemorrhage. Small acute infarcts are identified in the right cerebellum, left splenium and there are multiple subacute appearing punctate white matter infarcts - he has recent history of embolic infarcts in November 2022 with work-up positive for right-to-left shunt.  He was on Plavix since November 2022. -he was currently on Plavix and statin combination for secondary prevention, and had a breakthrough stroke again this admission, seen by neurology and stroke team, with his history of left to right shunt lower extremity venous duplex was  ordered which was positive for acute DVT.  He was initially on heparin drip for close monitoring in the light of his underlying acute strokes with mild petechial conversion.  Patient did transition to Eliquis today at 5 mg p.o. twice daily.    Acute on chronic left hip pain fall at home. -  Labile with supportive care CT hip unremarkable.   Metastatic cancer with unknown primary under the care of [name].  He finished his fourth cycle of FOLFOX on 11/30/2020.  CT scan done this admission now suggestive of possible gallbladder malignancy.  GI and [name] both consulted on 12/06/2021, oncology  to arrange for outpatient follow-up.   Klebsiella bacteremia incidental finding on 12/05/2020.  Initially on IV Rocephin, this has been narrowed to IV cefazolin, repeat blood cultures remain negative, antibiotics management per ID, as long cultures are negative will need total of 2 weeks treatment, IV cefazolin during hospital stay, then can be transitioned to oral on discharge, if repeat cultures are positive, then port need to be discontinued and she will need TEE . -ID will follow as an outpatient regarding repeat cultures once antibiotic treatment has finished . - ID  planning on transitioning from cefazolin to cefadroxil 1 g PO BID and treating for 10 more days on discharge   Known history of left shunt.   - Patient is currently on anticoagulation.   Hyponatremia. -   Initially due to dehydration now getting worse on IV fluids, did improve after holding IV fluids, most recent is 131.   AKI.   - Resolved after hydration with IV fluids.   Hypertension.     -Blood pressures controlled, not on any medications.   DM type II.   -Overall  poorly controlled as an outpatient where his insulin has been increased by his PCP, and he was started on Humalog, but he was admitted before starting this new regimen, he will be discharged on Tresiba 25 units, and insulin sliding scale, will resume home  metformin -A1c is elevated at 11.9        Discharge Diagnoses:  Principal Problem:   Acute embolic stroke [facility]) Active Problems:   CVA (cerebral vascular accident) ([place])   Bacteremia due to Klebsiella pneumoniae   Interatrial cardiac shunt   Cancer with unknown primary site [facility])   DVT in Left peroneal vein   Hyponatremia   AKI (acute kidney injury) ([place])   Type 2 diabetes mellitus with hyperglycemia ([place])   Essential hypertension   Recent cerebrovascular accident (CVA)    Discharge Instructions  Discharge Instructions     Diet - low sodium heart healthy   Complete by: As directed    Discharge instructions   Complete by: As directed    Follow with Primary MD [name], MD/SNF physician  Get CBC, CMP, checked  by Primary MD next visit.    Activity: As tolerated with Full fall precautions use walker/cane & assistance as needed   Disposition SNF   Diet: Heart Healthy /Carb modified , with feeding assistance and aspiration precautions.    On your next visit with your primary care physician please Get Medicines reviewed and adjusted.   Please request your Prim.MD to go over all Hospital Tests and Procedure/Radiological results at the follow up, please get all Hospital records sent to your Prim MD by signing hospital release before you go home.   If you experience worsening of your admission symptoms, develop shortness of breath, life threatening emergency, suicidal or homicidal thoughts you must seek medical attention immediately by calling 911 or calling your MD immediately  if symptoms less severe.  You Must read complete instructions/literature along with all the possible adverse reactions/side effects for all the Medicines you take and that have been prescribed to you. Take any new Medicines after you have completely understood and accpet all the possible adverse reactions/side effects.   Do not drive, operating heavy machinery, perform activities at  heights, swimming or participation in water activities or provide baby sitting services if your were admitted for syncope or siezures until you have seen by Primary MD or a Neurologist and advised to do so again.  Do not drive when taking Pain medications.    Do not take more than prescribed Pain, Sleep and Anxiety Medications  Special Instructions: If you have smoked or chewed Tobacco  in the last 2 yrs please stop smoking, stop any regular Alcohol  and or any Recreational drug use.  Wear Seat belts while driving.   Please note  You were cared for by a hospitalist during your hospital stay. If you have any questions about your discharge medications or the care you received while you were in the hospital after you are discharged, you can call the unit and asked to speak with the hospitalist on call if the hospitalist that took care of you is not available. Once you are discharged, your primary care physician will handle any further medical issues. Please note that NO REFILLS for any discharge medications will be authorized once you are discharged, as it is imperative that you return to your primary care physician (or establish a relationship with a primary care physician if you do not have one) for your aftercare needs so that they  can reassess your need for medications and monitor your lab values.   Increase activity slowly   Complete by: As directed       Allergies as of 12/09/2021   No Known Allergies      Medication List     STOP taking these medications    clopidogrel 75 MG tablet Commonly known as: PLAVIX   HumaLOG KwikPen 200 UNIT/ML KwikPen Generic drug: insulin lispro       TAKE these medications    Accu-Chek Aviva Plus w/Device Kit Use to check blood sugar once a day   Accu-Chek Softclix Lancets lancets Use to test blood sugar levels once a day.   acetaminophen 650 MG CR tablet Commonly known as: TYLENOL Take 650 mg by mouth every 8 (eight) hours as needed  for pain.   apixaban 5 MG Tabs tablet Commonly known as: ELIQUIS Take 1 tablet (5 mg total) by mouth 2 (two) times daily.   atorvastatin 40 MG tablet Commonly known as: LIPITOR Take 1 tablet (40 mg total) by mouth daily.   cefadroxil 500 MG capsule Commonly known as: DURICEF Take 2 capsules (1,000 mg total) by mouth 2 (two) times daily for 9 days. Start taking on: December 10, 2021   Dexcom G6 Receiver Ottertail Use to check blood sugar levels as directed.   Dexcom G6 Sensor Misc Use to check blood glucose levels as directed.   glucose blood test strip Use as instructed   insulin aspart 100 UNIT/ML injection Commonly known as: novoLOG Inject 0-15 Units into the skin 3 (three) times daily with meals.   lidocaine-prilocaine cream Commonly known as: EMLA Apply 1 application topically as needed. Apply 1/2 tablespoon to port site 2 hours prior to stick and cover with Press-and-Seal to numb site for access What changed:  when to take this reasons to take this additional instructions   metFORMIN 750 MG 24 hr tablet Commonly known as: GLUCOPHAGE-XR TAKE 1 TABLET BY MOUTH EVERY DAY WITH BREAKFAST What changed: See the new instructions.   ondansetron 8 MG tablet Commonly known as: ZOFRAN Take 1 tablet (8 mg total) by mouth every 8 (eight) hours as needed for nausea or vomiting. Start 72 hours after each IV chemotherapy administration   pantoprazole 20 MG tablet Commonly known as: PROTONIX TAKE 1 TABLET(20 MG) BY MOUTH DAILY   polyethylene glycol 17 g packet Commonly known as: MIRALAX / GLYCOLAX Take 34 g by mouth daily as needed.   prochlorperazine 10 MG tablet Commonly known as: COMPAZINE Take 1 tablet (10 mg total) by mouth every 6 (six) hours as needed for nausea.   senna-docusate 8.6-50 MG tablet Commonly known as: Senokot-S Take 2 tablets by mouth 2 (two) times daily. Hold for diarrhea   silodosin 8 MG Caps capsule Commonly known as: RAPAFLO Take 8 mg by mouth at  bedtime.   Toujeo Max SoloStar 300 UNIT/ML Solostar Pen Generic drug: insulin glargine (2 Unit Dial) Inject 25 Units into the skin daily. What changed: how much to take   traMADol 50 MG tablet Commonly known as: ULTRAM Take 1 tablet (50 mg total) by mouth every 6 (six) hours as needed for moderate pain.   Travatan Z 0.004 % Soln ophthalmic solution Generic drug: Travoprost (BAK Free) Place 1 drop into both eyes at bedtime.   Vitamin D3 50 MCG (2000 UT) Tabs Take 2,000 Units by mouth daily.        Contact information for follow-up providers     Marcial Pacas, MD. Daphane Shepherd  on 12/30/2021.   Specialty: Neurology Contact information: [address] [place] [address] [phone]              Contact information for after-discharge care     Destination     [facility] .   Service: Skilled Nursing Contact information: [address] [place] [phone]                    No Known Allergies  Consultations: ID Neurology Hematology   Procedures/Studies: CT HEAD W & WO CONTRAST (5MM)  Result Date: 11/18/2021 CLINICAL DATA:  History of cancer from unknown primary. Headache for 1 month which is on and off. Known metastases to the liver, question left parietal skull lesion. EXAM: CT HEAD WITHOUT AND WITH CONTRAST TECHNIQUE: Contiguous axial images were obtained from the base of the skull through the vertex without and with intravenous contrast CONTRAST:  56m OMNIPAQUE IOHEXOL 300 MG/ML  SOLN COMPARISON:  Brain MRI 09/16/2021 FINDINGS: Brain: Low-density with vague high-density/low-grade enhancement in the left cerebellum, in total measuring approximately 2.3 cm in diameter. Low-density in the right thalamus where there was previously demonstrated infarction. Chronic lacune in the right caudate head. No hydrocephalus or collection. Vascular: Major vessels are enhancing. Skull: Questioned parietal  bone lesion in the history. A lucency in the high left parietal bone has the appearance of a venous structure. No aggressive bone lesion. Sinuses/Orbits: Negative These results will be called to the ordering clinician or representative by the Radiologist Assistant, and communication documented in the PACS or [employer] IMPRESSION: New "lesion" in the left cerebellum when compared to brain MRI 09/16/2021. Appearance and rapid development is suspicious for a subacute infarct (multiple embolic infarcts seen on prior brain MRI). Metastatic focus is not excluded, recommend follow-up brain MRI with contrast in the next 4-6 weeks. Electronically Signed   By: JJorje GuildM.D.   On: 11/18/2021 18:00   MR BRAIN WO CONTRAST  Result Date: 12/05/2021 CLINICAL DATA:  81year old male acute neurologic deficit. Left side weakness. Metastatic disease unknown primary. Widely scattered small embolic appearing cerebral and cerebellar infarcts in November. New cerebellar hypodensity on CT yesterday. EXAM: MRI HEAD WITHOUT CONTRAST TECHNIQUE: Multiplanar, multiecho pulse sequences of the brain and surrounding structures were obtained without intravenous contrast. COMPARISON:  CTA head and neck yesterday.  Brain MRI 09/16/2021. FINDINGS: Brain: Mostly expected evolution of the scattered small bilateral cerebral and cerebellar infarcts seen in November, including in the right thalamus. But petechial hemorrhagic conversion of multiple bilateral cerebellar infarcts since that time, including confluent petechial hemorrhage in the left hemisphere on series 7, image 23 with faint associated cerebellar edema. And multiple such small hemorrhagic infarcts are new since November but nonacute. No contrast administered. On DWI there is a small contralateral central right cerebellar area of diffusion restriction now (series 2, image 11). a and a small focus of white matter restricted diffusion in the left splenium (series 2, image 28)  with T2 and FLAIR hyperintensity. Also questionable punctate restricted diffusion in the left occipital pole white matter, although may be susceptibility artifact from chronic microhemorrhage there also. Furthermore, there are several small bilateral frontal and parietal lobe central white matter foci of abnormal diffusion, although most appear facilitated on ADC (series 2, image 33). Fewer new but chronic microhemorrhages scattered in the supratentorial brain. Underlying confluent bilateral cerebral white matter T2 and FLAIR hyperintensity which is advanced but nonspecific. Stable similar T2 and FLAIR  hyperintensity in the pons. Small areas of developing cortical encephalomalacia corresponding to interval small cortical infarcts such as medial superior right parietal lobe series 6, image 29. No midline shift, obvious mass lesion, ventriculomegaly, extra-axial collection or acute intracranial hemorrhage. Cervicomedullary junction and pituitary are within normal limits. Vascular: Major intracranial vascular flow voids are stable. Skull and upper cervical spine: Heterogeneous bone marrow signal is indeterminate in the visible cervical spine, not definitely progressed since November. No destructive osseous lesion is identified. Sinuses/Orbits: Stable, negative. Other: Mastoids are clear. Visible internal auditory structures appear normal. IMPRESSION: 1. No contrast administered, limiting evaluation for metastatic disease. 2. Progression of embolic appearing small infarcts scattered in the bilateral cerebral and cerebellar hemispheres since November. Many such infarcts demonstrate petechial hemorrhage (including confluent new petechial hemorrhage in the left cerebellum), but no malignant hemorrhagic transformation or intracranial mass effect. Small acute infarcts are identified in the right cerebellum, left splenium. And there are multiple subacute appearing punctate white matter infarcts. 3. Underlying advanced but  nonspecific signal changes in the cerebral white matter and pons possibly due to chronic small vessel disease. Electronically Signed   By: Genevie Ann M.D.   On: 12/05/2021 09:28   CT ABDOMEN PELVIS W CONTRAST  Result Date: 12/05/2021 CLINICAL DATA:  Abdominal pain, acute, nonlocalized. Metastatic cancer of unknown primary. EXAM: CT ABDOMEN AND PELVIS WITH CONTRAST TECHNIQUE: Multidetector CT imaging of the abdomen and pelvis was performed using the standard protocol following bolus administration of intravenous contrast. RADIATION DOSE REDUCTION: This exam was performed according to the departmental dose-optimization program which includes automated exposure control, adjustment of the mA and/or kV according to patient size and/or use of iterative reconstruction technique. CONTRAST:  118m OMNIPAQUE IOHEXOL 300 MG/ML  SOLN COMPARISON:  PET CT 10/10/2021 FINDINGS: Lower chest: No acute abnormality. Hepatobiliary: Multiple heterogeneously enhancing masses are again seen throughout both hepatic lobes with the dominant conglomerate within segment 5 of the liver similar to that noted on prior PET CT examination. The appearance is relatively stable when compared to prior MRI examination of 09/03/2021. Mild bilobar intrahepatic biliary ductal dilation appears stable since prior MRI examination. Main, right, and left portal veins are patent. The gallbladder appears intimately associated with the malignant masses within segment 5 of the liver and a primary gallbladder carcinoma could be considered. This, too, appears unchanged from prior examination. Pancreas: Unremarkable Spleen: Unremarkable Adrenals/Urinary Tract: The adrenal glands are unremarkable. The kidneys are normal in size and position. 7 cm simple cortical cyst noted within the upper pole of the right kidney. Hyperdensity within the upper pole calices of the right kidney and within the left renal pelvis likely represents excreted contrast. No hydronephrosis. No  definite nephro or urolithiasis. The bladder is unremarkable. Stomach/Bowel: The stomach is unremarkable. The second portion the duodenum is in close proximity to the porta hepatis and cystic duct and there is infiltration of the periduodenal soft tissue which may relate to edema or infiltration related to the patient's primary malignancy. This is best appreciated on coronal image # 37/6 focal narrowing of the duodenum in this region may cicatricial change. The small and large bowel are unremarkable. The appendix is not clearly identified and may be absent. No free intraperitoneal gas or fluid. Reflect Vascular/Lymphatic: Aortic atherosclerosis. No enlarged abdominal or pelvic lymph nodes. Reproductive: The prostate gland is enlarged. Metallic densities within the prostate gland may relate to UroLift procedure. Other: Tiny fat containing umbilical hernia. Musculoskeletal: No acute bone abnormality. No lytic or blastic bone lesion. IMPRESSION:  Stable examination. Bilobar hepatic masses in keeping with metastatic disease with dominant conglomerate within segment 5 of the liver in close proximity to the adjacent gallbladder which appears contracted and demonstrates irregular wall thickening. 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 Infiltrative change within the porta hepatis noted with abutment of the second portion of the duodenum without evidence of obstruction or fistula formation at this time. Stable mild intrahepatic biliary ductal dilation. No acute intra-abdominal process identified. Aortic Atherosclerosis (ICD10-I70.0). Electronically Signed   By: Fidela Salisbury M.D.   On: 12/05/2021 21:28   CT HIP LEFT WO CONTRAST  Result Date: 12/05/2021 CLINICAL DATA:  Hip pain. Infection suspected. X-ray done. Acute on chronic left hip pain. Fall at home. History of osteoarthritis. Evaluate for occult fracture. EXAM: CT OF THE LEFT HIP WITHOUT CONTRAST TECHNIQUE: Multidetector CT  imaging of the left hip was performed according to the standard protocol. Multiplanar CT image reconstructions were also generated. RADIATION DOSE REDUCTION: This exam was performed according to the departmental dose-optimization program which includes automated exposure control, adjustment of the mA and/or kV according to patient size and/or use of iterative reconstruction technique. COMPARISON:  None available FINDINGS: Bones/Joint/Cartilage Moderate superior left femoroacetabular joint space narrowing. Mild left femoral head-neck junction circumferential degenerative osteophytes. Mild-to-moderate posterosuperior left femoral head-neck junction subchondral degenerative cystic change. Mild pubic symphysis joint space narrowing and peripheral osteophytosis degenerative change. No acute fracture is seen. No dislocation. No cortical erosion to suggest osteomyelitis. Ligaments Suboptimally assessed by CT. Muscles and Tendons Unremarkable, within the limitations of this modality. Soft tissues No left hip joint effusion. There are metallic densities within the prostate, possibly brachytherapy seeds. The partially visualized prostate is moderately enlarged, impressing on the urinary bladder base. Mild posterior urinary bladder outpouching may represent a small diverticulum superior to the prostate (axial series 5, image 38). Mild fat containing left inguinal hernia. Mild-to-moderate L5-S1 disc space narrowing, vacuum phenomenon, and peripheral degenerative osteophytosis. IMPRESSION: 1. No acute fracture or CT evidence of osteomyelitis is seen. 2. Moderate left femoroacetabular osteoarthritis. 3. Mild pubic symphysis osteoarthritis. Electronically Signed   By: Yvonne Kendall M.D.   On: 12/05/2021 13:05   DG Chest Port 1 View  Result Date: 12/05/2021 CLINICAL DATA:  81 year old male with shortness of breath. Metastatic disease unknown primary. EXAM: PORTABLE CHEST 1 VIEW COMPARISON:  Chest radiographs 09/20/2021 and  earlier. FINDINGS: Portable AP semi upright view at 0610 hours. Right chest power port is new since last year. Lower lung volumes. Mediastinal contours remain within normal limits. Visualized tracheal air column is within normal limits. Diffuse crowding of lung markings with no pneumothorax, pleural effusion, consolidation, or overt edema. No lung nodule identified. Visible osseous structures within normal limits. Paucity of bowel gas. IMPRESSION: Low lung volumes, otherwise no acute cardiopulmonary abnormality. Electronically Signed   By: Genevie Ann M.D.   On: 12/05/2021 06:42   VAS [place] LOWER EXTREMITY VENOUS (DVT)  Result Date: 12/06/2021  Lower Venous DVT Study Patient Name:  TYMERE DEPUY  Date of Exam:   12/05/2021 Medical Rec #: [id number]        Accession #:    [id number] Date of Birth: March 05, 1941        Patient Gender: M Patient Age:   48 years Exam Location:  [facility] Procedure:      VAS [place] LOWER EXTREMITY VENOUS (DVT) Referring Phys: Jennette Kettle --------------------------------------------------------------------------------  Indications: Stroke.  Risk Factors: None identified. Comparison Study: No prior studies. Performing Technologist: Belenda Cruise  Collins RVT  Examination Guidelines: A complete evaluation includes B-mode imaging, spectral Doppler, color Doppler, and power Doppler as needed of all accessible portions of each vessel. Bilateral testing is considered an integral part of a complete examination. Limited examinations for reoccurring indications may be performed as noted. The reflux portion of the exam is performed with the patient in reverse Trendelenburg.  +---------+---------------+---------+-----------+----------+--------------+  RIGHT     Compressibility Phasicity Spontaneity Properties Thrombus Aging  +---------+---------------+---------+-----------+----------+--------------+  CFV       Full            Yes       Yes                                     +---------+---------------+---------+-----------+----------+--------------+  SFJ       Full                                                             +---------+---------------+---------+-----------+----------+--------------+  FV Prox   Full                                                             +---------+---------------+---------+-----------+----------+--------------+  FV Mid    Full                                                             +---------+---------------+---------+-----------+----------+--------------+  FV Distal Full                                                             +---------+---------------+---------+-----------+----------+--------------+  PFV       Full                                                             +---------+---------------+---------+-----------+----------+--------------+  POP       Full            Yes       Yes                                    +---------+---------------+---------+-----------+----------+--------------+  PTV       Full                                                             +---------+---------------+---------+-----------+----------+--------------+  PERO      Full                                                             +---------+---------------+---------+-----------+----------+--------------+   +---------+---------------+---------+-----------+----------+-----------------+  LEFT      Compressibility Phasicity Spontaneity Properties Thrombus Aging     +---------+---------------+---------+-----------+----------+-----------------+  CFV       Full            Yes       Yes                                       +---------+---------------+---------+-----------+----------+-----------------+  SFJ       Full                                                                +---------+---------------+---------+-----------+----------+-----------------+  FV Prox   Full                                                                 +---------+---------------+---------+-----------+----------+-----------------+  FV Mid    Full                                                                +---------+---------------+---------+-----------+----------+-----------------+  FV Distal Full                                                                +---------+---------------+---------+-----------+----------+-----------------+  PFV       Full                                                                +---------+---------------+---------+-----------+----------+-----------------+  POP       Full            Yes       Yes                                       +---------+---------------+---------+-----------+----------+-----------------+  PTV       Full                                                                +---------+---------------+---------+-----------+----------+-----------------+  PERO      Partial                                          Age Indeterminate  +---------+---------------+---------+-----------+----------+-----------------+     Summary: RIGHT: - There is no evidence of deep vein thrombosis in the lower extremity.  - No cystic structure found in the popliteal fossa.  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 - No cystic structure found in the popliteal fossa.  *See table(s) above for measurements and observations. Electronically signed by Orlie Pollen on 12/06/2021 at 6:50:45 AM.    Final    ECHOCARDIOGRAM LIMITED  Result Date: 12/05/2021    ECHOCARDIOGRAM LIMITED REPORT   Patient Name:   THATCHER DOBERSTEIN Date of Exam: 12/05/2021 Medical Rec #:  [id number]       Height:       70.0 in Accession #:    [id number]      Weight:       180.1 lb Date of Birth:  1941/06/04       BSA:          1.996 m Patient Age:    29 years        BP:           144/89 mmHg Patient Gender: M               HR:           83 bpm. Exam Location:  Inpatient Procedure: Limited Echo, Limited Color  Doppler and Cardiac Doppler Indications:    stroke  History:        Patient has prior history of Echocardiogram examinations, most                 recent 09/17/2021. Cancer; Risk Factors:Diabetes and                 Hypertension.  Sonographer:    Johny Chess RDCS Referring Phys: [place]  1. LV function decreased since 09/17/21 no strain imaging performed . Left ventricular ejection fraction, by estimation, is 50 to 55%. The left ventricle has low normal function. The left ventricle has no regional wall motion abnormalities. The left ventricular internal cavity size was mildly dilated. There is mild left ventricular hypertrophy. Left ventricular diastolic parameters are consistent with Grade I diastolic dysfunction (impaired relaxation).  2. Right ventricular systolic function is normal. The right ventricular size is normal.  3. The mitral valve is abnormal. Trivial mitral valve regurgitation. No evidence of mitral stenosis.  4. The aortic valve is tricuspid. Aortic valve regurgitation is not visualized. No aortic stenosis is present.  5. Aortic dilatation noted. There is mild dilatation of the ascending aorta, measuring 39 mm.  6. The inferior vena cava is normal in size with greater than 50% respiratory variability, suggesting right atrial pressure of 3 mmHg. FINDINGS  Left Ventricle: LV function decreased since 09/17/21 no strain imaging performed. Left ventricular ejection fraction, by estimation, is 50 to 55%. The left ventricle has low normal function. The left ventricle has no regional wall motion abnormalities. The left ventricular internal cavity size was mildly dilated. There is mild left ventricular hypertrophy. Left ventricular diastolic parameters are consistent with Grade I diastolic dysfunction (impaired relaxation). Right Ventricle: The right ventricular size is normal. No increase in right ventricular wall  thickness. Right ventricular systolic function is normal. Left  Atrium: Left atrial size was normal in size. Right Atrium: Right atrial size was normal in size. Pericardium: There is no evidence of pericardial effusion. Mitral Valve: The mitral valve is abnormal. There is mild thickening of the mitral valve leaflet(s). Trivial mitral valve regurgitation. No evidence of mitral valve stenosis. Tricuspid Valve: The tricuspid valve is normal in structure. Tricuspid valve regurgitation is mild . No evidence of tricuspid stenosis. Aortic Valve: The aortic valve is tricuspid. Aortic valve regurgitation is not visualized. No aortic stenosis is present. Pulmonic Valve: The pulmonic valve was normal in structure. Pulmonic valve regurgitation is not visualized. No evidence of pulmonic stenosis. Aorta: Aortic dilatation noted. There is mild dilatation of the ascending aorta, measuring 39 mm. Venous: The inferior vena cava is normal in size with greater than 50% respiratory variability, suggesting right atrial pressure of 3 mmHg. IAS/Shunts: No atrial level shunt detected by color flow Doppler. LEFT VENTRICLE PLAX 2D LVIDd:         4.40 cm     Diastology LVIDs:         3.30 cm     LV e' medial:    3.92 cm/s LV PW:         1.00 cm     LV E/e' medial:  13.8 LV IVS:        1.30 cm     LV e' lateral:   6.74 cm/s                            LV E/e' lateral: 8.0  LV Volumes (MOD) LV vol d, MOD A2C: 75.9 ml LV vol s, MOD A2C: 47.1 ml LV SV MOD A2C:     28.8 ml IVC IVC diam: 1.40 cm LEFT ATRIUM         Index LA diam:    3.30 cm 1.65 cm/m  AORTIC VALVE LVOT Vmax:   81.10 cm/s LVOT Vmean:  55.700 cm/s LVOT VTI:    0.130 m  AORTA Ao Asc diam: 3.90 cm MITRAL VALVE MV Area (PHT): 2.96 cm    SHUNTS MV Decel Time: 256 msec    Systemic VTI: 0.13 m MV E velocity: 54.00 cm/s MV A velocity: 85.30 cm/s MV E/A ratio:  0.63 Jenkins Rouge MD Electronically signed by Jenkins Rouge MD Signature Date/Time: 12/05/2021/12:01:58 PM    Final    CT ANGIO HEAD NECK W WO CM W PERF (CODE STROKE)  Result Date:  12/04/2021 CLINICAL DATA:  Acute neuro deficit. Left-sided weakness. History of metastatic disease. EXAM: CT ANGIOGRAPHY HEAD AND NECK CT PERFUSION BRAIN TECHNIQUE: Multidetector CT imaging of the head and neck was performed using the standard protocol during bolus administration of intravenous contrast. Multiplanar CT image reconstructions and MIPs were obtained to evaluate the vascular anatomy. Carotid stenosis measurements (when applicable) are obtained utilizing NASCET criteria, using the distal internal carotid diameter as the denominator. Multiphase CT imaging of the brain was performed following IV bolus contrast injection. Subsequent parametric perfusion maps were calculated using RAPID software. RADIATION DOSE REDUCTION: This exam was performed according to the departmental dose-optimization program which includes automated exposure control, adjustment of the mA and/or kV according to patient size and/or use of iterative reconstruction technique. CONTRAST:  164m OMNIPAQUE IOHEXOL 350 MG/ML SOLN COMPARISON:  CT head 11/18/2021 FINDINGS: CT HEAD FINDINGS Brain: Generalized atrophy. Chronic microvascular ischemic change throughout the white matter. Chronic infarct right thalamus. Interval  contraction of hypodensity in the right cerebellum compatible with resolving infarct. Chronic infarct left superior cerebellum. Hypodensity in the central right cerebellum has progressed from prior studies and may be recent extension of a previously noted infarct. This measures 1 cm. Chronic infarct right superior cerebellum unchanged Negative for hemorrhage or mass. Vascular: Negative for hyperdense vessel Skull: Negative Sinuses: Paranasal sinuses clear Orbits: Bilateral cataract extraction. ASPECTS ([place] Stroke Program Early CT Score) - Ganglionic level infarction (caudate, lentiform nuclei, internal capsule, insula, M1-M3 cortex): 7 - Supraganglionic infarction (M4-M6 cortex): 3 Total score (0-10 with 10 being  normal): 10 Review of the MIP images confirms the above findings CTA NECK FINDINGS Aortic arch: Normal aortic arch. Proximal great vessels widely patent. Bovine branching arch. Right carotid system: Right carotid widely patent without stenosis or atherosclerotic disease. Left carotid system: Left carotid widely patent. Negative for atherosclerotic disease or stenosis Vertebral arteries: Both vertebral arteries widely patent to the skull base. No significant stenosis. Skeleton: Degenerative changes in the cervical spine. No acute skeletal abnormality. Other neck: Negative for mass or edema in the neck. Port-A-Cath tip right jugular vein. Upper chest: 8.5 mm spiculated nodule right upper lobe unchanged from PET-CT 10/10/2021. No other lung nodule in the apices. Review of the MIP images confirms the above findings CTA HEAD FINDINGS Anterior circulation: Cavernous carotid widely patent bilaterally. Anterior and middle cerebral arteries widely patent bilaterally. No stenosis or vascular malformation. Posterior circulation: Both vertebral arteries patent to the basilar. Basilar widely patent. PICA patent bilaterally. AICA, superior cerebellar, posterior cerebral arteries patent without stenosis or large vessel occlusion. Negative for cerebrovascular malformation. Venous sinuses: Normal venous enhancement Anatomic variants: None Review of the MIP images confirms the above findings CT Brain Perfusion Findings: ASPECTS: 10 CBF (<30%) Volume: 83m Perfusion (Tmax>6.0s) volume: 023mMismatch Volume: 71m58mnfarction Location:None IMPRESSION: 1. CT perfusion negative for acute infarct or ischemia 2. Negative for intracranial large vessel occlusion 3. No carotid or vertebral artery stenosis in the neck. No significant atherosclerotic disease. 4. CT head demonstrates new hypodensity right mid cerebellum. This could represent interval acute infarct or possibly metastatic disease given history. Follow-up MRI brain without and with  contrast recommended. Additional chronic ischemic changes stable from prior studies. 5. 8.5 mm spiculated nodule right upper lobe, stable from prior PET-CT 10/10/2021 Electronically Signed   By: ChaFranchot GalloD.   On: 12/04/2021 14:08      Subjective:  No further urinary retention, still with some constipation, still reports some generalized weakness and fatigue.   Discharge Exam: Vitals:   12/09/21 1211 12/09/21 1231  BP: (!) 132/94   Pulse: 91   Resp: (!) 27 18  Temp: 97.6 F (36.4 C)   SpO2: 97%    Vitals:   12/09/21 0421 12/09/21 0822 12/09/21 1211 12/09/21 1231  BP: (!) 160/96 (!) 134/2 (!) 132/94   Pulse: 81 78 91   Resp: 18 18 (!) 27 18  Temp: 98.1 F (36.7 C) 97.6 F (36.4 C) 97.6 F (36.4 C)   TempSrc: Oral Oral Oral   SpO2: 96% 98% 97%   Weight:      Height:        General: Pt is alert, awake, not in acute distress Cardiovascular: RRR, S1/S2 +, no rubs, no gallops Respiratory: CTA bilaterally, no wheezing, no rhonchi Abdominal: Soft, NT, ND, bowel sounds + Extremities: no edema, no cyanosis    The results of significant diagnostics from this hospitalization (including imaging, microbiology, ancillary and laboratory) are listed below for  reference.     Microbiology: Recent Results (from the past 240 hour(s))  Resp Panel by RT-PCR (Flu A&B, Covid) Nasopharyngeal Swab     Status: None   Collection Time: 12/04/21  2:10 PM   Specimen: Nasopharyngeal Swab; Nasopharyngeal(NP) swabs in vial transport medium  Result Value Ref Range Status   SARS Coronavirus 2 by RT PCR NEGATIVE NEGATIVE Final    Comment: (NOTE) SARS-CoV-2 target nucleic acids are NOT DETECTED.  The SARS-CoV-2 RNA is generally detectable in upper respiratory specimens during the acute phase of infection. The lowest concentration of SARS-CoV-2 viral copies this assay can detect is 138 copies/mL. A negative result does not preclude SARS-Cov-2 infection and should not be used as the sole  basis for treatment or other patient management decisions. A negative result may occur with  improper specimen collection/handling, submission of specimen other than nasopharyngeal swab, presence of viral mutation(s) within the areas targeted by this assay, and inadequate number of viral copies(<138 copies/mL). A negative result must be combined with clinical observations, patient history, and epidemiological information. The expected result is Negative.  Fact Sheet for Patients:  EntrepreneurPulse.com.au  Fact Sheet for Healthcare Providers:  IncredibleEmployment.be  This test is no t yet approved or cleared by the [place] FDA and  has been authorized for detection and/or diagnosis of SARS-CoV-2 by FDA under an Emergency Use Authorization (EUA). This EUA will remain  in effect (meaning this test can be used) for the duration of the COVID-19 declaration under Section 564(b)(1) of the Act, 21 U.S.C.section 360bbb-3(b)(1), unless the authorization is terminated  or revoked sooner.       Influenza A by PCR NEGATIVE NEGATIVE Final   Influenza B by PCR NEGATIVE NEGATIVE Final    Comment: (NOTE) The Xpert Xpress SARS-CoV-2/FLU/RSV plus assay is intended as an aid in the diagnosis of influenza from Nasopharyngeal swab specimens and should not be used as a sole basis for treatment. Nasal washings and aspirates are unacceptable for Xpert Xpress SARS-CoV-2/FLU/RSV testing.  Fact Sheet for Patients: EntrepreneurPulse.com.au  Fact Sheet for Healthcare Providers: IncredibleEmployment.be  This test is not yet approved or cleared by the [place] FDA and has been authorized for detection and/or diagnosis of SARS-CoV-2 by FDA under an Emergency Use Authorization (EUA). This EUA will remain in effect (meaning this test can be used) for the duration of the COVID-19 declaration under Section 564(b)(1) of the Act,  21 U.S.C. section 360bbb-3(b)(1), unless the authorization is terminated or revoked.  Performed at [facility], [address], [place], [place] [address]   Culture, blood (routine x 2)     Status: Abnormal   Collection Time: 12/05/21 12:38 AM   Specimen: BLOOD LEFT ARM  Result Value Ref Range Status   Specimen Description BLOOD LEFT ARM  Final   Special Requests   Final    BOTTLES DRAWN AEROBIC AND ANAEROBIC Blood Culture results may not be optimal due to an excessive volume of blood received in culture bottles   Culture  Setup Time   Final    GRAM NEGATIVE RODS IN BOTH AEROBIC AND ANAEROBIC BOTTLES    Culture (A)  Final    KLEBSIELLA PNEUMONIAE SUSCEPTIBILITIES PERFORMED ON PREVIOUS CULTURE WITHIN THE LAST 5 DAYS. Performed at [facility], [place] [address], [place], [place] [address]    Report Status 12/07/2021 FINAL  Final  Culture, blood (routine x 2)     Status: Abnormal   Collection Time: 12/05/21 12:46 AM   Specimen: BLOOD  RIGHT ARM  Result Value Ref Range Status   Specimen Description BLOOD RIGHT ARM  Final   Special Requests   Final    BOTTLES DRAWN AEROBIC AND ANAEROBIC Blood Culture adequate volume   Culture  Setup Time   Final    GRAM NEGATIVE RODS IN BOTH AEROBIC AND ANAEROBIC BOTTLES CRITICAL RESULT CALLED TO, READ BACK BY AND VERIFIED WITH: J. FRENS PHARMD, AT [phone] 12/05/21 D. VANHOOK Performed at [facility], [place] [address] [place], [place] [address]    Culture KLEBSIELLA PNEUMONIAE (A)  Final   Report Status 12/07/2021 FINAL  Final   Organism ID, Bacteria KLEBSIELLA PNEUMONIAE  Final      Susceptibility   Klebsiella pneumoniae - MIC*    AMPICILLIN >=32 RESISTANT Resistant     CEFAZOLIN <=4 SENSITIVE Sensitive     CEFEPIME <=0.12 SENSITIVE Sensitive     CEFTAZIDIME <=1 SENSITIVE Sensitive     CEFTRIAXONE <=0.25 SENSITIVE Sensitive     CIPROFLOXACIN <=0.25 SENSITIVE Sensitive     GENTAMICIN <=1 SENSITIVE Sensitive      IMIPENEM <=0.25 SENSITIVE Sensitive     TRIMETH/SULFA <=20 SENSITIVE Sensitive     AMPICILLIN/SULBACTAM <=2 SENSITIVE Sensitive     PIP/TAZO <=4 SENSITIVE Sensitive     * KLEBSIELLA PNEUMONIAE  Blood Culture ID Panel (Reflexed)     Status: Abnormal   Collection Time: 12/05/21 12:46 AM  Result Value Ref Range Status   Enterococcus faecalis NOT DETECTED NOT DETECTED Final   Enterococcus Faecium NOT DETECTED NOT DETECTED Final   Listeria monocytogenes NOT DETECTED NOT DETECTED Final   Staphylococcus species NOT DETECTED NOT DETECTED Final   Staphylococcus aureus (BCID) NOT DETECTED NOT DETECTED Final   Staphylococcus epidermidis NOT DETECTED NOT DETECTED Final   Staphylococcus lugdunensis NOT DETECTED NOT DETECTED Final   Streptococcus species NOT DETECTED NOT DETECTED Final   Streptococcus agalactiae NOT DETECTED NOT DETECTED Final   Streptococcus pneumoniae NOT DETECTED NOT DETECTED Final   Streptococcus pyogenes NOT DETECTED NOT DETECTED Final   A.calcoaceticus-baumannii NOT DETECTED NOT DETECTED Final   Bacteroides fragilis NOT DETECTED NOT DETECTED Final   Enterobacterales DETECTED (A) NOT DETECTED Final    Comment: Enterobacterales represent a large order of gram negative bacteria, not a single organism. CRITICAL RESULT CALLED TO, READ BACK BY AND VERIFIED WITH: J. FRENS PHARMD, AT [phone] 12/05/21 D. VANHOOK    Enterobacter cloacae complex NOT DETECTED NOT DETECTED Final   Escherichia coli NOT DETECTED NOT DETECTED Final   Klebsiella aerogenes NOT DETECTED NOT DETECTED Final   Klebsiella oxytoca NOT DETECTED NOT DETECTED Final   Klebsiella pneumoniae DETECTED (A) NOT DETECTED Final    Comment: CRITICAL RESULT CALLED TO, READ BACK BY AND VERIFIED WITH: J. FRENS PHARMD, AT 3875 12/05/21 D. VANHOOK    Proteus species NOT DETECTED NOT DETECTED Final   Salmonella species NOT DETECTED NOT DETECTED Final   Serratia marcescens NOT DETECTED NOT DETECTED Final   Haemophilus influenzae NOT  DETECTED NOT DETECTED Final   Neisseria meningitidis NOT DETECTED NOT DETECTED Final   Pseudomonas aeruginosa NOT DETECTED NOT DETECTED Final   Stenotrophomonas maltophilia NOT DETECTED NOT DETECTED Final   Candida albicans NOT DETECTED NOT DETECTED Final   Candida auris NOT DETECTED NOT DETECTED Final   Candida glabrata NOT DETECTED NOT DETECTED Final   Candida krusei NOT DETECTED NOT DETECTED Final   Candida parapsilosis NOT DETECTED NOT DETECTED Final   Candida tropicalis NOT DETECTED NOT DETECTED Final   Cryptococcus neoformans/gattii NOT DETECTED  NOT DETECTED Final   CTX-M ESBL NOT DETECTED NOT DETECTED Final   Carbapenem resistance IMP NOT DETECTED NOT DETECTED Final   Carbapenem resistance KPC NOT DETECTED NOT DETECTED Final   Carbapenem resistance NDM NOT DETECTED NOT DETECTED Final   Carbapenem resist OXA 48 LIKE NOT DETECTED NOT DETECTED Final   Carbapenem resistance VIM NOT DETECTED NOT DETECTED Final    Comment: Performed at [facility], [place] [address] [place], [place] [address]  Urine Culture     Status: None   Collection Time: 12/05/21  5:58 AM   Specimen: Urine, Clean Catch  Result Value Ref Range Status   Specimen Description URINE, CLEAN CATCH  Final   Special Requests NONE  Final   Culture   Final    NO GROWTH Performed at [facility], [place] [address] [place], [place] [address]    Report Status 12/07/2021 FINAL  Final  Culture, blood (Routine X 2) w Reflex to ID Panel     Status: None (Preliminary result)   Collection Time: 12/07/21  5:46 AM   Specimen: BLOOD  Result Value Ref Range Status   Specimen Description BLOOD RIGHT ANTECUBITAL  Final   Special Requests   Final    BOTTLES DRAWN AEROBIC AND ANAEROBIC Blood Culture adequate volume   Culture   Final    NO GROWTH 2 DAYS Performed at [facility], [place] [address], [place], [place] [address]    Report Status PENDING  Incomplete  Culture, blood (Routine X 2) w Reflex to ID Panel      Status: None (Preliminary result)   Collection Time: 12/07/21  5:46 AM   Specimen: BLOOD RIGHT HAND  Result Value Ref Range Status   Specimen Description BLOOD RIGHT HAND  Final   Special Requests   Final    BOTTLES DRAWN AEROBIC ONLY Blood Culture results may not be optimal due to an inadequate volume of blood received in culture bottles   Culture   Final    NO GROWTH 2 DAYS Performed at [facility], [place] [address] [place], [place] [address]    Report Status PENDING  Incomplete  Resp Panel by RT-PCR (Flu A&B, Covid) Nasopharyngeal Swab     Status: None   Collection Time: 12/09/21 11:59 AM   Specimen: Nasopharyngeal Swab; Nasopharyngeal(NP) swabs in vial transport medium  Result Value Ref Range Status   SARS Coronavirus 2 by RT PCR NEGATIVE NEGATIVE Final    Comment: (NOTE) SARS-CoV-2 target nucleic acids are NOT DETECTED.  The SARS-CoV-2 RNA is generally detectable in upper respiratory specimens during the acute phase of infection. The lowest concentration of SARS-CoV-2 viral copies this assay can detect is 138 copies/mL. A negative result does not preclude SARS-Cov-2 infection and should not be used as the sole basis for treatment or other patient management decisions. A negative result may occur with  improper specimen collection/handling, submission of specimen other than nasopharyngeal swab, presence of viral mutation(s) within the areas targeted by this assay, and inadequate number of viral copies(<138 copies/mL). A negative result must be combined with clinical observations, patient history, and epidemiological information. The expected result is Negative.  Fact Sheet for Patients:  EntrepreneurPulse.com.au  Fact Sheet for Healthcare Providers:  IncredibleEmployment.be  This test is no t yet approved or cleared by the [place] FDA and  has been authorized for detection and/or diagnosis of SARS-CoV-2 by FDA under an  Emergency Use Authorization (EUA). This EUA will remain  in effect (meaning this test can be used) for the duration of the COVID-19 declaration under Section 564(b)(1) of the Act, 21 U.S.C.section 360bbb-3(b)(1), unless the authorization is terminated  or revoked sooner.       Influenza A by PCR NEGATIVE NEGATIVE Final   Influenza B by PCR NEGATIVE NEGATIVE Final    Comment: (NOTE) The Xpert Xpress SARS-CoV-2/FLU/RSV plus assay is intended as an aid in the diagnosis of influenza from Nasopharyngeal swab specimens and should not be used as a sole basis for treatment. Nasal washings and aspirates are unacceptable for Xpert Xpress SARS-CoV-2/FLU/RSV testing.  Fact Sheet for Patients: EntrepreneurPulse.com.au  Fact Sheet for Healthcare Providers: IncredibleEmployment.be  This test is not yet approved or cleared by the [place] FDA and has been authorized for detection and/or diagnosis of SARS-CoV-2 by FDA under an Emergency Use Authorization (EUA). This EUA will remain in effect (meaning this test can be used) for the duration of the COVID-19 declaration under Section 564(b)(1) of the Act, 21 U.S.C. section 360bbb-3(b)(1), unless the authorization is terminated or revoked.  Performed at [facility], [place] [address], [place], [place] [address]      Labs: BNP (last 3 results) Recent Labs    12/06/21 0404 12/07/21 0440 12/08/21 0315  BNP 111.6* 80.9 47.4   Basic Metabolic Panel: Recent Labs  Lab 12/04/21 2257 12/05/21 0425 12/05/21 0645 12/06/21 0404 12/07/21 0440 12/08/21 0315  NA 133* 133*  --  130* 127* 131*  K 4.1 3.6  --  3.5 3.8 4.0  CL 95* 99  --  101 98 101  CO2 26 25  --  _0 GLUCOSE 290* 150*  --  93 156* 224*  BUN 34* 31*  --  _1 CREATININE 1.54* 1.42*  --  1.07 1.15 1.09  CALCIUM 8.7* 8.2*  --  7.8* 7.8* 7.9*  MG  --   --  1.6* 1.9 1.7 1.6*   Liver Function Tests: Recent Labs  Lab  12/04/21 1309 12/06/21 0404 12/07/21 0440 12/08/21 0315  AST 31 59* 58* 69*  ALT _2 32  ALKPHOS 73 70 67 71  BILITOT 0.8 0.7 0.7 0.6  PROT 5.7* 4.8* 4.7* 4.8*  ALBUMIN 3.2* 2.1* 2.0* 2.0*   No results for input(s): LIPASE, AMYLASE in the last 168 hours. No results for input(s): AMMONIA in the last 168 hours. CBC: Recent Labs  Lab 12/04/21 1309 12/04/21 1340 12/05/21 0425 12/06/21 0404 12/07/21 0440 12/08/21 0315  WBC 4.9  --  5.1 8.6 5.3 3.7*  NEUTROABS 4.7  --   --  6.9 3.4 1.8  HGB 11.3* 12.2* 11.2* 10.9* 10.7* 10.5*  HCT 33.8* 36.0* 32.7* 31.4* 31.4* 30.9*  MCV 84.3  --  83.8 84.0 84.2 83.7  PLT 75*  --  56* 56* 60* 64*   Cardiac Enzymes: No results for input(s): CKTOTAL, CKMB, CKMBINDEX, TROPONINI in the last 168 hours. BNP: Invalid input(s): POCBNP CBG: Recent Labs  Lab 12/08/21 1238 12/08/21 1611 12/08/21 2038 12/09/21 0809 12/09/21 1203  GLUCAP 196* 213* 274* 186* 189*   D-Dimer No results for input(s): DDIMER in the last 72 hours. Hgb A1c No results for input(s): HGBA1C in the last 72 hours. Lipid Profile No results for input(s): CHOL, HDL, LDLCALC, TRIG, CHOLHDL, LDLDIRECT in the last 72 hours. Thyroid function studies No results for input(s): TSH, T4TOTAL, T3FREE, THYROIDAB in the last 72 hours.  Invalid input(s): FREET3 Anemia work up No results for input(s):  VITAMINB12, FOLATE, FERRITIN, TIBC, IRON, RETICCTPCT in the last 72 hours. Urinalysis    Component Value Date/Time   COLORURINE YELLOW 12/06/2021 1350   APPEARANCEUR CLEAR 12/06/2021 1350   LABSPEC 1.026 12/06/2021 1350   PHURINE 7.0 12/06/2021 1350   GLUCOSEU 150 (A) 12/06/2021 1350   HGBUR LARGE (A) 12/06/2021 1350   HGBUR negative 06/15/2008 0000   BILIRUBINUR NEGATIVE 12/06/2021 1350   KETONESUR NEGATIVE 12/06/2021 1350   PROTEINUR 30 (A) 12/06/2021 1350   UROBILINOGEN 0.2 06/15/2008 0000   NITRITE NEGATIVE 12/06/2021 1350   LEUKOCYTESUR TRACE (A) 12/06/2021 1350    Sepsis Labs Invalid input(s): PROCALCITONIN,  WBC,  LACTICIDVEN Microbiology Recent Results (from the past 240 hour(s))  Resp Panel by RT-PCR (Flu A&B, Covid) Nasopharyngeal Swab     Status: None   Collection Time: 12/04/21  2:10 PM   Specimen: Nasopharyngeal Swab; Nasopharyngeal(NP) swabs in vial transport medium  Result Value Ref Range Status   SARS Coronavirus 2 by RT PCR NEGATIVE NEGATIVE Final    Comment: (NOTE) SARS-CoV-2 target nucleic acids are NOT DETECTED.  The SARS-CoV-2 RNA is generally detectable in upper respiratory specimens during the acute phase of infection. The lowest concentration of SARS-CoV-2 viral copies this assay can detect is 138 copies/mL. A negative result does not preclude SARS-Cov-2 infection and should not be used as the sole basis for treatment or other patient management decisions. A negative result may occur with  improper specimen collection/handling, submission of specimen other than nasopharyngeal swab, presence of viral mutation(s) within the areas targeted by this assay, and inadequate number of viral copies(<138 copies/mL). A negative result must be combined with clinical observations, patient history, and epidemiological information. The expected result is Negative.  Fact Sheet for Patients:  EntrepreneurPulse.com.au  Fact Sheet for Healthcare Providers:  IncredibleEmployment.be  This test is no t yet approved or cleared by the [place] FDA and  has been authorized for detection and/or diagnosis of SARS-CoV-2 by FDA under an Emergency Use Authorization (EUA). This EUA will remain  in effect (meaning this test can be used) for the duration of the COVID-19 declaration under Section 564(b)(1) of the Act, 21 U.S.C.section 360bbb-3(b)(1), unless the authorization is terminated  or revoked sooner.       Influenza A by PCR NEGATIVE NEGATIVE Final   Influenza B by PCR NEGATIVE NEGATIVE Final     Comment: (NOTE) The Xpert Xpress SARS-CoV-2/FLU/RSV plus assay is intended as an aid in the diagnosis of influenza from Nasopharyngeal swab specimens and should not be used as a sole basis for treatment. Nasal washings and aspirates are unacceptable for Xpert Xpress SARS-CoV-2/FLU/RSV testing.  Fact Sheet for Patients: EntrepreneurPulse.com.au  Fact Sheet for Healthcare Providers: IncredibleEmployment.be  This test is not yet approved or cleared by the [place] FDA and has been authorized for detection and/or diagnosis of SARS-CoV-2 by FDA under an Emergency Use Authorization (EUA). This EUA will remain in effect (meaning this test can be used) for the duration of the COVID-19 declaration under Section 564(b)(1) of the Act, 21 U.S.C. section 360bbb-3(b)(1), unless the authorization is terminated or revoked.  Performed at [facility], [address] [place], [place] [address]   Culture, blood (routine x 2)     Status: Abnormal   Collection Time: 12/05/21 12:38 AM   Specimen: BLOOD LEFT ARM  Result Value Ref Range Status   Specimen Description BLOOD LEFT ARM  Final   Special Requests   Final    BOTTLES DRAWN AEROBIC AND ANAEROBIC  Blood Culture results may not be optimal due to an excessive volume of blood received in culture bottles   Culture  Setup Time   Final    GRAM NEGATIVE RODS IN BOTH AEROBIC AND ANAEROBIC BOTTLES    Culture (A)  Final    KLEBSIELLA PNEUMONIAE SUSCEPTIBILITIES PERFORMED ON PREVIOUS CULTURE WITHIN THE LAST 5 DAYS. Performed at [facility], [place] [address], [place], [place] [address]    Report Status 12/07/2021 FINAL  Final  Culture, blood (routine x 2)     Status: Abnormal   Collection Time: 12/05/21 12:46 AM   Specimen: BLOOD RIGHT ARM  Result Value Ref Range Status   Specimen Description BLOOD RIGHT ARM  Final   Special Requests   Final    BOTTLES DRAWN AEROBIC AND ANAEROBIC Blood  Culture adequate volume   Culture  Setup Time   Final    GRAM NEGATIVE RODS IN BOTH AEROBIC AND ANAEROBIC BOTTLES CRITICAL RESULT CALLED TO, READ BACK BY AND VERIFIED WITH: J. FRENS PHARMD, AT 9323 12/05/21 D. VANHOOK Performed at [facility], [place] [address] [place], [place] [address]    Culture KLEBSIELLA PNEUMONIAE (A)  Final   Report Status 12/07/2021 FINAL  Final   Organism ID, Bacteria KLEBSIELLA PNEUMONIAE  Final      Susceptibility   Klebsiella pneumoniae - MIC*    AMPICILLIN >=32 RESISTANT Resistant     CEFAZOLIN <=4 SENSITIVE Sensitive     CEFEPIME <=0.12 SENSITIVE Sensitive     CEFTAZIDIME <=1 SENSITIVE Sensitive     CEFTRIAXONE <=0.25 SENSITIVE Sensitive     CIPROFLOXACIN <=0.25 SENSITIVE Sensitive     GENTAMICIN <=1 SENSITIVE Sensitive     IMIPENEM <=0.25 SENSITIVE Sensitive     TRIMETH/SULFA <=20 SENSITIVE Sensitive     AMPICILLIN/SULBACTAM <=2 SENSITIVE Sensitive     PIP/TAZO <=4 SENSITIVE Sensitive     * KLEBSIELLA PNEUMONIAE  Blood Culture ID Panel (Reflexed)     Status: Abnormal   Collection Time: 12/05/21 12:46 AM  Result Value Ref Range Status   Enterococcus faecalis NOT DETECTED NOT DETECTED Final   Enterococcus Faecium NOT DETECTED NOT DETECTED Final   Listeria monocytogenes NOT DETECTED NOT DETECTED Final   Staphylococcus species NOT DETECTED NOT DETECTED Final   Staphylococcus aureus (BCID) NOT DETECTED NOT DETECTED Final   Staphylococcus epidermidis NOT DETECTED NOT DETECTED Final   Staphylococcus lugdunensis NOT DETECTED NOT DETECTED Final   Streptococcus species NOT DETECTED NOT DETECTED Final   Streptococcus agalactiae NOT DETECTED NOT DETECTED Final   Streptococcus pneumoniae NOT DETECTED NOT DETECTED Final   Streptococcus pyogenes NOT DETECTED NOT DETECTED Final   A.calcoaceticus-baumannii NOT DETECTED NOT DETECTED Final   Bacteroides fragilis NOT DETECTED NOT DETECTED Final   Enterobacterales DETECTED (A) NOT DETECTED Final    Comment:  Enterobacterales represent a large order of gram negative bacteria, not a single organism. CRITICAL RESULT CALLED TO, READ BACK BY AND VERIFIED WITH: J. FRENS PHARMD, AT 2025 12/05/21 D. VANHOOK    Enterobacter cloacae complex NOT DETECTED NOT DETECTED Final   Escherichia coli NOT DETECTED NOT DETECTED Final   Klebsiella aerogenes NOT DETECTED NOT DETECTED Final   Klebsiella oxytoca NOT DETECTED NOT DETECTED Final   Klebsiella pneumoniae DETECTED (A) NOT DETECTED Final    Comment: CRITICAL RESULT CALLED TO, READ BACK BY AND VERIFIED WITH: J. FRENS PHARMD, AT 4270 12/05/21 D. VANHOOK    Proteus species NOT DETECTED NOT DETECTED Final   Salmonella species NOT DETECTED NOT DETECTED  Final   Serratia marcescens NOT DETECTED NOT DETECTED Final   Haemophilus influenzae NOT DETECTED NOT DETECTED Final   Neisseria meningitidis NOT DETECTED NOT DETECTED Final   Pseudomonas aeruginosa NOT DETECTED NOT DETECTED Final   Stenotrophomonas maltophilia NOT DETECTED NOT DETECTED Final   Candida albicans NOT DETECTED NOT DETECTED Final   Candida auris NOT DETECTED NOT DETECTED Final   Candida glabrata NOT DETECTED NOT DETECTED Final   Candida krusei NOT DETECTED NOT DETECTED Final   Candida parapsilosis NOT DETECTED NOT DETECTED Final   Candida tropicalis NOT DETECTED NOT DETECTED Final   Cryptococcus neoformans/gattii NOT DETECTED NOT DETECTED Final   CTX-M ESBL NOT DETECTED NOT DETECTED Final   Carbapenem resistance IMP NOT DETECTED NOT DETECTED Final   Carbapenem resistance KPC NOT DETECTED NOT DETECTED Final   Carbapenem resistance NDM NOT DETECTED NOT DETECTED Final   Carbapenem resist OXA 48 LIKE NOT DETECTED NOT DETECTED Final   Carbapenem resistance VIM NOT DETECTED NOT DETECTED Final    Comment: Performed at [facility], [address] [place], [place] [address]  Urine Culture     Status: None   Collection Time: 12/05/21  5:58 AM   Specimen: Urine, Clean Catch  Result Value Ref Range  Status   Specimen Description URINE, CLEAN CATCH  Final   Special Requests NONE  Final   Culture   Final    NO GROWTH Performed at [facility], [place] [address] [place], [place] [address]    Report Status 12/07/2021 FINAL  Final  Culture, blood (Routine X 2) w Reflex to ID Panel     Status: None (Preliminary result)   Collection Time: 12/07/21  5:46 AM   Specimen: BLOOD  Result Value Ref Range Status   Specimen Description BLOOD RIGHT ANTECUBITAL  Final   Special Requests   Final    BOTTLES DRAWN AEROBIC AND ANAEROBIC Blood Culture adequate volume   Culture   Final    NO GROWTH 2 DAYS Performed at [facility], [place] [address] [place], [place] [address]    Report Status PENDING  Incomplete  Culture, blood (Routine X 2) w Reflex to ID Panel     Status: None (Preliminary result)   Collection Time: 12/07/21  5:46 AM   Specimen: BLOOD RIGHT HAND  Result Value Ref Range Status   Specimen Description BLOOD RIGHT HAND  Final   Special Requests   Final    BOTTLES DRAWN AEROBIC ONLY Blood Culture results may not be optimal due to an inadequate volume of blood received in culture bottles   Culture   Final    NO GROWTH 2 DAYS Performed at [facility], [place] [address] [place], [place] [address]    Report Status PENDING  Incomplete  Resp Panel by RT-PCR (Flu A&B, Covid) Nasopharyngeal Swab     Status: None   Collection Time: 12/09/21 11:59 AM   Specimen: Nasopharyngeal Swab; Nasopharyngeal(NP) swabs in vial transport medium  Result Value Ref Range Status   SARS Coronavirus 2 by RT PCR NEGATIVE NEGATIVE Final    Comment: (NOTE) SARS-CoV-2 target nucleic acids are NOT DETECTED.  The SARS-CoV-2 RNA is generally detectable in upper respiratory specimens during the acute phase of infection. The lowest concentration of SARS-CoV-2 viral copies this assay can detect is 138 copies/mL. A negative result does not preclude SARS-Cov-2 infection and should not be used as the sole  basis for treatment or other patient management decisions. A negative  result may occur with  improper specimen collection/handling, submission of specimen other than nasopharyngeal swab, presence of viral mutation(s) within the areas targeted by this assay, and inadequate number of viral copies(<138 copies/mL). A negative result must be combined with clinical observations, patient history, and epidemiological information. The expected result is Negative.  Fact Sheet for Patients:  EntrepreneurPulse.com.au  Fact Sheet for Healthcare Providers:  IncredibleEmployment.be  This test is no t yet approved or cleared by the [place] FDA and  has been authorized for detection and/or diagnosis of SARS-CoV-2 by FDA under an Emergency Use Authorization (EUA). This EUA will remain  in effect (meaning this test can be used) for the duration of the COVID-19 declaration under Section 564(b)(1) of the Act, 21 U.S.C.section 360bbb-3(b)(1), unless the authorization is terminated  or revoked sooner.       Influenza A by PCR NEGATIVE NEGATIVE Final   Influenza B by PCR NEGATIVE NEGATIVE Final    Comment: (NOTE) The Xpert Xpress SARS-CoV-2/FLU/RSV plus assay is intended as an aid in the diagnosis of influenza from Nasopharyngeal swab specimens and should not be used as a sole basis for treatment. Nasal washings and aspirates are unacceptable for Xpert Xpress SARS-CoV-2/FLU/RSV testing.  Fact Sheet for Patients: EntrepreneurPulse.com.au  Fact Sheet for Healthcare Providers: IncredibleEmployment.be  This test is not yet approved or cleared by the [place] FDA and has been authorized for detection and/or diagnosis of SARS-CoV-2 by FDA under an Emergency Use Authorization (EUA). This EUA will remain in effect (meaning this test can be used) for the duration of the COVID-19 declaration under Section 564(b)(1) of the Act,  21 U.S.C. section 360bbb-3(b)(1), unless the authorization is terminated or revoked.  Performed at [facility], [place] [address], [place], [place] [address]      Time coordinating discharge: Over 30 minutes  SIGNED:   Phillips Climes, MD  [facility] 12/09/2021, 2:34 PM Pager   If 7PM-7AM, please contact night-coverage [email or web address] Password [email or web address]

## 2021-12-09 NOTE — TOC Progression Note (Signed)
Transition of Care [facility]) - Progression Note    Patient Details  Name: Robert Warner MRN: [id number] Date of Birth: 1941/10/31  Transition of [facility]) CM/SW [place], LCSW Phone Number: 12/09/2021, 9:18 AM  Clinical Narrative:    Claudina Lick has received approval for patient to discharge to [facility] today. Requested rapid COVID test.    Expected Discharge Plan: [place] Barriers to Discharge: Continued Medical Work up, Ship broker  Expected Discharge Plan and Services Expected Discharge Plan: [place] In-house Referral: Clinical Social Work Discharge Planning Services: [employer] Consult Post Acute Care Choice: [place] Living arrangements for the past 2 months: Single Family Home                           HH Arranged: PT, OT, Speech Therapy HH Agency: [place] Date [place]: 12/06/21 Time HH Agency Contacted: 1400 Representative spoke with at [place]: [place] ([place]) Interventions    Readmission Risk Interventions No flowsheet data found.

## 2021-12-09 NOTE — Progress Notes (Signed)
Report given to Almyra Free RN at [place] @ [phone]

## 2021-12-09 NOTE — Care Management Important Message (Signed)
Important Message  Patient Details  Name: Robert Warner MRN: [id number] Date of Birth: 1941-04-02   Medicare Important Message Given:  Yes     Orbie Pyo 12/09/2021, 2:44 PM

## 2021-12-09 NOTE — TOC Transition Note (Signed)
Transition of Care [facility]) - CM/SW Discharge Note   Patient Details  Name: Robert Warner MRN: [id number] Date of Birth: Jul 15, 1941  Transition of Care [facility]) CM/SW Contact:  Benard Halsted, LCSW Phone Number: 12/09/2021, 1:44 PM   Clinical Narrative:    Patient will DC to: [facility] Anticipated DC date: 12/09/21 Family notified: Spouse Transport by: Corey Harold   Per MD patient ready for DC to [facility]. RN to call report prior to discharge [phone] Room 114). RN, patient, patient's family, and facility notified of DC. Discharge Summary and FL2 sent to facility. DC packet on chart. Ambulance transport requested for patient.   CSW will sign off for now as social work intervention is no longer needed. Please consult [place] again if new needs arise.     Final next level of care: Skilled Nursing Facility Barriers to Discharge: Barriers Resolved   Patient Goals and CMS Choice Patient states their goals for this hospitalization and ongoing recovery are:: return home CMS Medicare.gov Compare Post Acute Care list provided to:: Patient Represenative (must comment) Choice offered to / list presented to : Patient, Spouse  Discharge Placement   Existing PASRR number confirmed : 12/09/21          Patient [facility] bed at: [facility] Patient to be transferred to facility by: [place] Name of family member notified: Spouse Patient and family notified of of transfer: 12/09/21  Discharge Plan and Services In-house Referral: Clinical Social Work Discharge Planning Services: [employer] Consult Post Acute Care Choice: [place]                    HH Arranged: PT, OT, Speech Therapy [place]: [place] Date [facility] Agency Contacted: 12/06/21 Time HH Agency Contacted: 88 Representative spoke with at [place]: Tommi Rumps  Social Determinants of Health ([place]) Interventions     Readmission Risk Interventions No flowsheet data found.

## 2021-12-09 NOTE — Progress Notes (Signed)
Physical Therapy Treatment Patient Details Name: Robert Warner MRN: [id number] DOB: June 07, 1941 Today's Date: 12/09/2021   History of Present Illness 81 y.o. M admitted 1/22 with imbalance and LUE weakness. MRI showing progression of small infarcts scattered in the bil cerebral and cerebellar hemispheres since November. PMHx: CA, HLD, DM2, prior embolic CVA.    PT Comments    Pt making slow progress with mobility. Pt still requires significant assist with basic mobility and continue to recommend SNF for further rehab so pt can reach a level where he can return home with wife.    Recommendations for follow up therapy are one component of a multi-disciplinary discharge planning process, led by the attending physician.  Recommendations may be updated based on patient status, additional functional criteria and insurance authorization.  Follow Up Recommendations  Skilled nursing-short term rehab (<3 hours/day)     Assistance Recommended at Discharge Frequent or constant Supervision/Assistance  Patient can return home with the following A lot of help with walking and/or transfers;Help with stairs or ramp for entrance;Assist for transportation   Equipment Recommendations  None recommended by PT    Recommendations for Other Services       Precautions / Restrictions Precautions Precautions: Fall     Mobility  Bed Mobility               General bed mobility comments: Pt up in chair    Transfers Overall transfer level: Needs assistance Equipment used: Rolling walker (2 wheels), None Transfers: Sit to/from Stand Sit to Stand: Mod assist, +2 physical assistance           General transfer comment: Assist to bring hips up and for balance. Pt with lt hip pain making powering up to stand difficult. Pt with difficulty transitioning hands from chair armrests to walker. Frequent verbal/tactile cues for hand placement    Ambulation/Gait Ambulation/Gait assistance: Min assist, +2  safety/equipment Gait Distance (Feet): 200 Feet Assistive device: Rolling walker (2 wheels) Gait Pattern/deviations: Step-through pattern, Decreased stride length, Trunk flexed Gait velocity: decr Gait velocity interpretation: <1.8 ft/sec, indicate of risk for recurrent falls   General Gait Details: Assist for balance and support. Verbal/tactile cues for posture and to keep feet inside base of walker especially on turns. Pt with incr vearing to lt and left lateral trunk lean as distance increased.   Stairs             Wheelchair Mobility    Modified Rankin (Stroke Patients Only) Modified Rankin (Stroke Patients Only) Pre-Morbid Rankin Score: Moderate disability Modified Rankin: Moderately severe disability     Balance Overall balance assessment: Needs assistance Sitting-balance support: Feet supported Sitting balance-Leahy Scale: Fair     Standing balance support: During functional activity, Bilateral upper extremity supported, Reliant on assistive device for balance Standing balance-Leahy Scale: Poor Standing balance comment: walker and min assist for static standing                            Cognition Arousal/Alertness: Awake/alert Behavior During Therapy: WFL for tasks assessed/performed Overall Cognitive Status: History of cognitive impairments - at baseline Area of Impairment: Attention, Memory, Following commands, Safety/judgement, Awareness, Problem solving                   Current Attention Level: Selective Memory: Decreased short-term memory, Decreased recall of precautions Following Commands: Follows one step commands with increased time Safety/Judgement: Decreased awareness of safety, Decreased awareness of deficits Awareness: Intellectual  Problem Solving: Slow processing, Difficulty sequencing, Requires verbal cues, Requires tactile cues General Comments: Pt needed frequent verbal/tactile cues for correct technique for transfers and  use of walker        Exercises      General Comments General comments (skin integrity, edema, etc.): VSS on RA. Wife present      Pertinent Vitals/Pain Pain Assessment Pain Assessment: Faces Faces Pain Scale: Hurts even more Pain Location: lt hip with sit to stand and gait Pain Descriptors / Indicators: Aching Pain Intervention(s): Limited activity within patient's tolerance, Repositioned    Home Living                          Prior Function            PT Goals (current goals can now be found in the care plan section) Progress towards PT goals: Progressing toward goals    Frequency    Min 3X/week      PT Plan Current plan remains appropriate    Co-evaluation              AM-PAC PT "6 Clicks" Mobility   Outcome Measure  Help needed turning from your back to your side while in a flat bed without using bedrails?: A Little Help needed moving from lying on your back to sitting on the side of a flat bed without using bedrails?: A Little Help needed moving to and from a bed to a chair (including a wheelchair)?: A Lot Help needed standing up from a chair using your arms (e.g., wheelchair or bedside chair)?: Total Help needed to walk in hospital room?: A Little Help needed climbing 3-5 steps with a railing? : Total 6 Click Score: 13    End of Session Equipment Utilized During Treatment: Gait belt Activity Tolerance: Patient tolerated treatment well Patient left: in chair;with call bell/phone within reach;with chair alarm set;with family/visitor present Nurse Communication: Mobility status PT Visit Diagnosis: Other abnormalities of gait and mobility (R26.89);Unsteadiness on feet (R26.81)     Time: 1771-1657 PT Time Calculation (min) (ACUTE ONLY): 22 min  Charges:  $Gait Training: 8-22 mins                     [place] Pager [phone] Office [place] 12/09/2021, 1:23 PM

## 2021-12-09 NOTE — Progress Notes (Signed)
RN spoke with Denton Ar at [place], per [place], pt is going to independent living and after 18:00 there is not staff available to take report. Per Denton Ar, she will call back RN to provide an update if there is a Marine scientist at [employer] available to take report.

## 2021-12-10 DIAGNOSIS — K219 Gastro-esophageal reflux disease without esophagitis: Secondary | ICD-10-CM | POA: Diagnosis not present

## 2021-12-10 DIAGNOSIS — R410 Disorientation, unspecified: Secondary | ICD-10-CM | POA: Diagnosis not present

## 2021-12-10 DIAGNOSIS — Z7901 Long term (current) use of anticoagulants: Secondary | ICD-10-CM | POA: Diagnosis not present

## 2021-12-10 DIAGNOSIS — I82452 Acute embolism and thrombosis of left peroneal vein: Secondary | ICD-10-CM | POA: Diagnosis not present

## 2021-12-10 DIAGNOSIS — N400 Enlarged prostate without lower urinary tract symptoms: Secondary | ICD-10-CM | POA: Diagnosis not present

## 2021-12-10 DIAGNOSIS — I69398 Other sequelae of cerebral infarction: Secondary | ICD-10-CM | POA: Diagnosis not present

## 2021-12-10 DIAGNOSIS — M6281 Muscle weakness (generalized): Secondary | ICD-10-CM | POA: Diagnosis not present

## 2021-12-10 DIAGNOSIS — Z794 Long term (current) use of insulin: Secondary | ICD-10-CM | POA: Diagnosis not present

## 2021-12-10 DIAGNOSIS — R970 Elevated carcinoembryonic antigen [CEA]: Secondary | ICD-10-CM | POA: Diagnosis not present

## 2021-12-10 DIAGNOSIS — E1165 Type 2 diabetes mellitus with hyperglycemia: Secondary | ICD-10-CM | POA: Diagnosis not present

## 2021-12-10 DIAGNOSIS — I10 Essential (primary) hypertension: Secondary | ICD-10-CM | POA: Diagnosis not present

## 2021-12-10 DIAGNOSIS — R2689 Other abnormalities of gait and mobility: Secondary | ICD-10-CM | POA: Diagnosis not present

## 2021-12-10 DIAGNOSIS — Z452 Encounter for adjustment and management of vascular access device: Secondary | ICD-10-CM | POA: Diagnosis not present

## 2021-12-10 DIAGNOSIS — I6310 Cerebral infarction due to embolism of unspecified precerebral artery: Secondary | ICD-10-CM | POA: Diagnosis not present

## 2021-12-10 DIAGNOSIS — Z7409 Other reduced mobility: Secondary | ICD-10-CM | POA: Diagnosis not present

## 2021-12-10 DIAGNOSIS — N189 Chronic kidney disease, unspecified: Secondary | ICD-10-CM | POA: Diagnosis not present

## 2021-12-10 DIAGNOSIS — I82409 Acute embolism and thrombosis of unspecified deep veins of unspecified lower extremity: Secondary | ICD-10-CM | POA: Diagnosis not present

## 2021-12-10 DIAGNOSIS — B961 Klebsiella pneumoniae [K. pneumoniae] as the cause of diseases classified elsewhere: Secondary | ICD-10-CM | POA: Diagnosis not present

## 2021-12-10 DIAGNOSIS — I824Z9 Acute embolism and thrombosis of unspecified deep veins of unspecified distal lower extremity: Secondary | ICD-10-CM | POA: Diagnosis not present

## 2021-12-10 DIAGNOSIS — C787 Secondary malignant neoplasm of liver and intrahepatic bile duct: Secondary | ICD-10-CM | POA: Diagnosis not present

## 2021-12-10 DIAGNOSIS — R7881 Bacteremia: Secondary | ICD-10-CM | POA: Diagnosis not present

## 2021-12-10 DIAGNOSIS — E1122 Type 2 diabetes mellitus with diabetic chronic kidney disease: Secondary | ICD-10-CM | POA: Diagnosis not present

## 2021-12-10 DIAGNOSIS — I129 Hypertensive chronic kidney disease with stage 1 through stage 4 chronic kidney disease, or unspecified chronic kidney disease: Secondary | ICD-10-CM | POA: Diagnosis not present

## 2021-12-10 DIAGNOSIS — C801 Malignant (primary) neoplasm, unspecified: Secondary | ICD-10-CM | POA: Diagnosis not present

## 2021-12-10 DIAGNOSIS — E1169 Type 2 diabetes mellitus with other specified complication: Secondary | ICD-10-CM | POA: Diagnosis not present

## 2021-12-10 DIAGNOSIS — C229 Malignant neoplasm of liver, not specified as primary or secondary: Secondary | ICD-10-CM | POA: Diagnosis not present

## 2021-12-12 ENCOUNTER — Ambulatory Visit: Payer: Medicare HMO | Admitting: Physical Therapy

## 2021-12-12 DIAGNOSIS — R2689 Other abnormalities of gait and mobility: Secondary | ICD-10-CM | POA: Diagnosis not present

## 2021-12-12 DIAGNOSIS — I6310 Cerebral infarction due to embolism of unspecified precerebral artery: Secondary | ICD-10-CM | POA: Diagnosis not present

## 2021-12-12 DIAGNOSIS — I10 Essential (primary) hypertension: Secondary | ICD-10-CM | POA: Diagnosis not present

## 2021-12-12 DIAGNOSIS — I824Z9 Acute embolism and thrombosis of unspecified deep veins of unspecified distal lower extremity: Secondary | ICD-10-CM | POA: Diagnosis not present

## 2021-12-12 DIAGNOSIS — E1165 Type 2 diabetes mellitus with hyperglycemia: Secondary | ICD-10-CM | POA: Diagnosis not present

## 2021-12-12 DIAGNOSIS — N400 Enlarged prostate without lower urinary tract symptoms: Secondary | ICD-10-CM | POA: Diagnosis not present

## 2021-12-12 DIAGNOSIS — K219 Gastro-esophageal reflux disease without esophagitis: Secondary | ICD-10-CM | POA: Diagnosis not present

## 2021-12-12 DIAGNOSIS — Z7409 Other reduced mobility: Secondary | ICD-10-CM | POA: Diagnosis not present

## 2021-12-12 DIAGNOSIS — Z794 Long term (current) use of insulin: Secondary | ICD-10-CM | POA: Diagnosis not present

## 2021-12-12 LAB — CULTURE, BLOOD (ROUTINE X 2)
Culture: NO GROWTH
Culture: NO GROWTH
Special Requests: ADEQUATE

## 2021-12-13 ENCOUNTER — Telehealth: Payer: Self-pay | Admitting: *Deleted

## 2021-12-13 NOTE — Telephone Encounter (Signed)
Robert Warner was contacted by telephone to verify understanding of discharge instructions status post their most recent discharge from the hospital on the date:  12/09/21.  Inpatient discharge AVS was re-reviewed with his wife along with cancer center appointments. Will see him with labs tomorrow and chemo will be held this week. Verification of understanding for oncology specific follow-up was validated using the Teach Back method.    Transportation to appointments were confirmed for the patient as being  Rehab facility will transport .  [name] questions were addressed to their satisfaction upon completion of this post discharge follow-up call for outpatient oncology.  Called facility and left message w/Brenda to determine if he will be able to continue his chemotherapy while in rehab? Left this RN's direct number for return call.

## 2021-12-14 ENCOUNTER — Other Ambulatory Visit: Payer: Self-pay | Admitting: Oncology

## 2021-12-14 ENCOUNTER — Inpatient Hospital Stay: Payer: Medicare HMO

## 2021-12-14 ENCOUNTER — Inpatient Hospital Stay: Payer: Medicare HMO | Attending: Oncology | Admitting: Oncology

## 2021-12-14 ENCOUNTER — Other Ambulatory Visit: Payer: Self-pay

## 2021-12-14 VITALS — BP 123/93 | HR 90 | Temp 98.1°F | Resp 20 | Ht 70.0 in | Wt 178.2 lb

## 2021-12-14 DIAGNOSIS — R410 Disorientation, unspecified: Secondary | ICD-10-CM | POA: Diagnosis not present

## 2021-12-14 DIAGNOSIS — E1122 Type 2 diabetes mellitus with diabetic chronic kidney disease: Secondary | ICD-10-CM | POA: Diagnosis not present

## 2021-12-14 DIAGNOSIS — C787 Secondary malignant neoplasm of liver and intrahepatic bile duct: Secondary | ICD-10-CM | POA: Diagnosis not present

## 2021-12-14 DIAGNOSIS — C801 Malignant (primary) neoplasm, unspecified: Secondary | ICD-10-CM | POA: Diagnosis not present

## 2021-12-14 DIAGNOSIS — R970 Elevated carcinoembryonic antigen [CEA]: Secondary | ICD-10-CM | POA: Diagnosis not present

## 2021-12-14 DIAGNOSIS — N189 Chronic kidney disease, unspecified: Secondary | ICD-10-CM | POA: Diagnosis not present

## 2021-12-14 DIAGNOSIS — C229 Malignant neoplasm of liver, not specified as primary or secondary: Secondary | ICD-10-CM

## 2021-12-14 DIAGNOSIS — Z452 Encounter for adjustment and management of vascular access device: Secondary | ICD-10-CM | POA: Insufficient documentation

## 2021-12-14 DIAGNOSIS — I82452 Acute embolism and thrombosis of left peroneal vein: Secondary | ICD-10-CM | POA: Diagnosis not present

## 2021-12-14 DIAGNOSIS — I129 Hypertensive chronic kidney disease with stage 1 through stage 4 chronic kidney disease, or unspecified chronic kidney disease: Secondary | ICD-10-CM | POA: Insufficient documentation

## 2021-12-14 DIAGNOSIS — Z95828 Presence of other vascular implants and grafts: Secondary | ICD-10-CM

## 2021-12-14 DIAGNOSIS — Z7901 Long term (current) use of anticoagulants: Secondary | ICD-10-CM | POA: Diagnosis not present

## 2021-12-14 LAB — CBC WITH DIFFERENTIAL (CANCER CENTER ONLY)
Abs Immature Granulocytes: 0.01 10*3/uL (ref 0.00–0.07)
Basophils Absolute: 0 10*3/uL (ref 0.0–0.1)
Basophils Relative: 1 %
Eosinophils Absolute: 0 10*3/uL (ref 0.0–0.5)
Eosinophils Relative: 2 %
HCT: 35.2 % — ABNORMAL LOW (ref 39.0–52.0)
Hemoglobin: 11.3 g/dL — ABNORMAL LOW (ref 13.0–17.0)
Immature Granulocytes: 0 %
Lymphocytes Relative: 37 %
Lymphs Abs: 0.9 10*3/uL (ref 0.7–4.0)
MCH: 27.8 pg (ref 26.0–34.0)
MCHC: 32.1 g/dL (ref 30.0–36.0)
MCV: 86.5 fL (ref 80.0–100.0)
Monocytes Absolute: 0.4 10*3/uL (ref 0.1–1.0)
Monocytes Relative: 14 %
Neutro Abs: 1.1 10*3/uL — ABNORMAL LOW (ref 1.7–7.7)
Neutrophils Relative %: 46 %
Platelet Count: 272 10*3/uL (ref 150–400)
RBC: 4.07 MIL/uL — ABNORMAL LOW (ref 4.22–5.81)
RDW: 16.1 % — ABNORMAL HIGH (ref 11.5–15.5)
WBC Count: 2.5 10*3/uL — ABNORMAL LOW (ref 4.0–10.5)
nRBC: 0 % (ref 0.0–0.2)

## 2021-12-14 LAB — CMP (CANCER CENTER ONLY)
ALT: 24 U/L (ref 0–44)
AST: 37 U/L (ref 15–41)
Albumin: 3 g/dL — ABNORMAL LOW (ref 3.5–5.0)
Alkaline Phosphatase: 83 U/L (ref 38–126)
Anion gap: 7 (ref 5–15)
BUN: 16 mg/dL (ref 8–23)
CO2: 26 mmol/L (ref 22–32)
Calcium: 9.1 mg/dL (ref 8.9–10.3)
Chloride: 100 mmol/L (ref 98–111)
Creatinine: 0.95 mg/dL (ref 0.61–1.24)
GFR, Estimated: >60 mL/min (ref >60)
Glucose, Bld: 311 mg/dL — ABNORMAL HIGH (ref 70–99)
Potassium: 4.3 mmol/L (ref 3.5–5.1)
Sodium: 133 mmol/L — ABNORMAL LOW (ref 135–145)
Total Bilirubin: 0.7 mg/dL (ref 0.3–1.2)
Total Protein: 6.2 g/dL — ABNORMAL LOW (ref 6.5–8.1)

## 2021-12-14 MED ORDER — SODIUM CHLORIDE 0.9% FLUSH
10.0000 mL | Freq: Once | INTRAVENOUS | Status: AC
  Administered 2021-12-14: 10 mL via INTRAVENOUS

## 2021-12-14 MED ORDER — HEPARIN SOD (PORK) LOCK FLUSH 100 UNIT/ML IV SOLN
500.0000 [IU] | Freq: Once | INTRAVENOUS | Status: AC
  Administered 2021-12-14: 500 [IU] via INTRAVENOUS

## 2021-12-14 NOTE — Progress Notes (Signed)
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  He was found to have left lower extremity DVT and evidence of embolic stroke secondary to a right to left cardiac shunt.  He was placed on apixaban anticoagulation while hospitalized. He was also diagnosed with Klebsiella bacteremia 12/05/2020.  No source for infection was identified.  He continues Augmentin.  He was discharged to a skilled nursing facility.  He is participating in physical therapy.  His wife is with him today.  She reports he has a good appetite.  He remains confused and incontinent of urine and stool.  He is dependent on the nursing facility staff for all of his care.  Left hip discomfort persists, but has improved.  This pain predated the recent hospital admission.  Objective:  Vital signs in last 24 hours:  Blood pressure (!) 123/93, pulse 90, temperature 98.1 F (36.7 C), temperature source Oral, resp. rate 20, height 5' 10"  (1.778 m), weight 178 lb 3.2 oz (80.8 kg), SpO2 100 %.    HEENT: No thrush Resp: Lungs clear bilaterally Cardio: Regular rate and rhythm nontender GI: Nontender, no hepatomegaly Vascular: Trace lower leg and ankle edema bilaterally Neuro: Alert, follows commands, appears mildly confused    Portacath/PICC-without erythema  Lab Results:  Lab Results  Component Value Date   WBC 2.5 (L) 12/14/2021   HGB 11.3 (L) 12/14/2021   HCT 35.2 (L) 12/14/2021   MCV 86.5 12/14/2021   PLT 272 12/14/2021   NEUTROABS 1.1 (L) 12/14/2021    CMP  Lab Results  Component Value Date   NA 133 (L) 12/14/2021   K 4.3 12/14/2021   CL 100 12/14/2021   CO2 26 12/14/2021   GLUCOSE 311 (H) 12/14/2021   BUN 16 12/14/2021   CREATININE 0.95 12/14/2021   CALCIUM 9.1 12/14/2021   PROT 6.2 (L) 12/14/2021   ALBUMIN 3.0 (L) 12/14/2021   AST 37 12/14/2021   ALT  24 12/14/2021   ALKPHOS 83 12/14/2021   BILITOT 0.7 12/14/2021   GFRNONAA >60 12/14/2021   GFRAA 95 06/15/2008    Lab Results  Component Value Date   CEA 33.79 (H) 09/29/2021   OEU235 81 (H) 09/29/2021   Medications: I have reviewed the patient's current medications.   Assessment/Plan: Poorly differentiated adenocarcinoma involving the liver Abdominal ultrasound 08/05/2021-indeterminate 50m solid mass in the right hepatic lobe.   MRI of the liver 09/06/2021-multifocal rim-enhancing lesions throughout both lobes of the liver.  Index lesion within the lateral dome of right hepatic lobe measures 1.1 cm, segment 4A lesion measures 1.6 x 1.4 cm, segment 2 lesion measures 0.8 x 0.7 cm, posteromedial margin of the right hepatic lobe subcapsular lesion measures 2.5 x 2.0 cm 09/16/2021 CT angio neck-9 mm right upper lobe nodule Biopsy of a right liver mass on 09/23/2021.  Pathology shows poorly differentiated adenocarcinoma positive for cytokeratin 7, CDX2 and cytokeratin 20 and negative for TTF-1, PSA and prostein.  Differential diagnoses include pancreatobiliary and less likely upper GI. HER2 positive by FISH PD-L1 combined positive score 0% MSS equivocal, tumor mutation burden 0, ERBB2 amplification equivocal 09/29/2021 CA 19-9 81, CEA 33 PET scan 10/10/2021-solitary 10 mm right upper lobe pulmonary nodule with minimal FDG uptake.  7.5 mm left internal mammary lymph node hypermetabolic with SUV max 43.61  Numerous hepatic lesions.  Periportal lymphadenopathy SUV max 8.18.  2 adjacent lesions noted in  the mesentery in the right mid abdomen with somewhat irregular margins, SUV 8.09.  No hypermetabolic colonic lesion identified to suggest a primary colon cancer.  No pancreatic lesion.  No gastric lesion.  No retroperitoneal lymphadenopathy. Upper endoscopy 10/13/2021-no mass, H. pylori gastritis on gastric biopsy Cycle 1 FOLFOX 10/19/2021 Cycle 2 FOLFOX 11/02/2021, Emend added, oxaliplatin dose  reduced Cycle 3 FOLFOX 11/16/2021 Cycle 4 FOLFOX 11/30/2021 CT abdomen/pelvis with contrast on 12/05/2021-stable exam with by lobar hepatic masses, findings suggestive of metastatic gallbladder cancer. Hospital admission 09/16/2021 - 09/19/2021 with gait disturbance-multifocal embolic stroke on MRI 50/03/1832, echo with interatrial shunt, transcranial Doppler with bubble study indicative of a medium size right to left shunt, lower extremity Doppler studies negative for DVT Hospital admission 09/20/2021 - 09/22/2021 with chest pain-CT coronary with no acute findings.   Diabetes Hypertension BPH Chronic kidney disease Brain CT 11/18/2021-new "lesion" in the left cerebellum when compared to brain MRI 09/16/2021.  Appearance of rapid development suspicious for a subacute infarct.  Metastatic focus not excluded.  Follow-up brain MRI with contrast recommended in the next 4 to 6 weeks. [facility] admission 5/82/[id number] embolic CVAs, Heparin then apixaban anticoagulation 10.  Klebsiella bacteremia 12/05/2021-no apparent source for infection 11.  Thrombocytopenia secondary to chemotherapy and bacteremia 12.  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  He has completed 4 cycles of FOLFOX.  He tolerated the chemotherapy well.  There is no clinical or radiologic evidence of disease progression.  However he has experienced multiple CVAs over the past few months.  He appears to have a hypercoagulation syndrome related to cancer.  He was recently diagnosed with a left lower extremity DVT and there is evidence of a right to left cardiac shunt.  He is now maintained on apixaban anticoagulation.  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  He is participating in physical therapy.  He is dependent on the nursing center staff for all of his care.  He does not appear to be a candidate for further chemotherapy unless his  performance status improves.  We discussed treatment options today including continuing FOLFOX and switching to HER2 directed therapy.  He has mild neutropenia today.  This is likely related to the recent bacteremia and chemotherapy.  His wife and the nursing home staff will call if he develops a fever.  He continues antibiotics as directed by Dr. Graylon Good.  He will return for an office visit as scheduled on 12/28/2021.  [name], MD  12/14/2021  12:36 PM

## 2021-12-15 ENCOUNTER — Other Ambulatory Visit: Payer: Self-pay | Admitting: Nurse Practitioner

## 2021-12-15 DIAGNOSIS — C229 Malignant neoplasm of liver, not specified as primary or secondary: Secondary | ICD-10-CM

## 2021-12-15 NOTE — Telephone Encounter (Signed)
Pt is not taking this medication at this time refill on hold

## 2021-12-16 ENCOUNTER — Inpatient Hospital Stay: Payer: Medicare HMO

## 2021-12-19 ENCOUNTER — Ambulatory Visit: Payer: Medicare HMO | Admitting: Physical Therapy

## 2021-12-23 ENCOUNTER — Encounter: Payer: Self-pay | Admitting: Physical Therapy

## 2021-12-23 NOTE — Therapy (Signed)
[facility] [address] [place], [place], [address] Phone: [phone]   Fax:  [phone]  Patient Details  Name: Robert Warner MRN: [id number] Date of Birth: 1941-07-20 Referring Provider:  No ref. provider found  Encounter Date: 12/23/2021  PHYSICAL THERAPY DISCHARGE SUMMARY  Visits from Start of Care: 10  Current functional level related to goals / functional outcomes:  PT Short Term Goals - 11/25/21 1204       PT SHORT TERM GOAL #1   Title Patient to be independent with initial HEP.    Time 3    Period Weeks    Status Achieved    Target Date 10/21/21             PT Long Term Goals - 12/02/21 1150       PT LONG TERM GOAL #1   Title Patient to be independent with advanced HEP.    Time 8    Period Weeks    Status Partially Met    Target Date 01/20/22      PT LONG TERM GOAL #2   Title Patient to complete TUG in <14 sec with LRAD in order to decrease risk of falls.    Time 6    Period Weeks    Status Achieved      PT LONG TERM GOAL #3   Title Patient to score at least 23/30 on FGA in order to decrease risk of falls.    Baseline 20/30 at last assessment    Time 8    Period Weeks    Status On-going   20/30   Target Date 01/20/22      PT LONG TERM GOAL #4   Title Patient to demonstrate B LE strength >/=4+/5.    Time 8    Period Weeks    Status Partially Met    Target Date 01/20/22      PT LONG TERM GOAL #5   Title Patient will ambulate over outdoor surfaces with LRAD while performing head turns to scan environment with good stability in order to indicate safe community mobility.    Time 8    Period Weeks    Status Partially Met   NT today d/t time   Target Date 01/20/22            Unable to fully address, as pt did not return after 10th visit due to change in medical status.   Remaining deficits: See eval/notes   Education / Equipment: Initiated HEP   Patient agrees to discharge. Patient  goals were not met. Patient is being discharged due to a change in medical status.  Pt hospitalized with new CVA.  Advith Martine W., PT 12/23/2021, 10:51 AM  [facility] [address] [place], [place], [address] Phone: [phone]   Fax:  [phone]

## 2021-12-26 ENCOUNTER — Ambulatory Visit: Payer: Medicare HMO | Admitting: Physical Therapy

## 2021-12-27 ENCOUNTER — Telehealth: Payer: Self-pay | Admitting: Family Medicine

## 2021-12-27 NOTE — Telephone Encounter (Signed)
Pt wife call and stated she need dr.Burchette nurse to call her back to explain how to give pt his inulin he just got out of the hospital she need a call back today she call at 10;45 AM

## 2021-12-27 NOTE — Telephone Encounter (Signed)
Spoke with the patients wife. She stated the patient's fasting glucose this morning was 89. She would like to know if he should still take the toujeo in the morning if his BG is under 100. Please advise.

## 2021-12-27 NOTE — Telephone Encounter (Signed)
Spoke with the patient's wife and discussed Dr. Anastasio Auerbach message in detail. She expressed understanding. Nothing further needed at this time.

## 2021-12-28 ENCOUNTER — Other Ambulatory Visit: Payer: Self-pay

## 2021-12-28 ENCOUNTER — Inpatient Hospital Stay: Payer: Medicare HMO

## 2021-12-28 ENCOUNTER — Inpatient Hospital Stay (HOSPITAL_BASED_OUTPATIENT_CLINIC_OR_DEPARTMENT_OTHER): Payer: Medicare HMO | Admitting: Oncology

## 2021-12-28 ENCOUNTER — Inpatient Hospital Stay: Payer: Medicare HMO | Admitting: Nutrition

## 2021-12-28 VITALS — BP 115/78 | HR 89 | Temp 98.1°F | Resp 19 | Ht 70.0 in | Wt 179.0 lb

## 2021-12-28 DIAGNOSIS — N189 Chronic kidney disease, unspecified: Secondary | ICD-10-CM | POA: Diagnosis not present

## 2021-12-28 DIAGNOSIS — C801 Malignant (primary) neoplasm, unspecified: Secondary | ICD-10-CM | POA: Diagnosis not present

## 2021-12-28 DIAGNOSIS — Z95828 Presence of other vascular implants and grafts: Secondary | ICD-10-CM | POA: Diagnosis not present

## 2021-12-28 DIAGNOSIS — I129 Hypertensive chronic kidney disease with stage 1 through stage 4 chronic kidney disease, or unspecified chronic kidney disease: Secondary | ICD-10-CM | POA: Diagnosis not present

## 2021-12-28 DIAGNOSIS — Z452 Encounter for adjustment and management of vascular access device: Secondary | ICD-10-CM | POA: Diagnosis not present

## 2021-12-28 DIAGNOSIS — C787 Secondary malignant neoplasm of liver and intrahepatic bile duct: Secondary | ICD-10-CM | POA: Diagnosis not present

## 2021-12-28 DIAGNOSIS — E1122 Type 2 diabetes mellitus with diabetic chronic kidney disease: Secondary | ICD-10-CM | POA: Diagnosis not present

## 2021-12-28 DIAGNOSIS — I82452 Acute embolism and thrombosis of left peroneal vein: Secondary | ICD-10-CM | POA: Diagnosis not present

## 2021-12-28 DIAGNOSIS — R970 Elevated carcinoembryonic antigen [CEA]: Secondary | ICD-10-CM | POA: Diagnosis not present

## 2021-12-28 DIAGNOSIS — Z7901 Long term (current) use of anticoagulants: Secondary | ICD-10-CM | POA: Diagnosis not present

## 2021-12-28 DIAGNOSIS — C229 Malignant neoplasm of liver, not specified as primary or secondary: Secondary | ICD-10-CM

## 2021-12-28 LAB — CMP (CANCER CENTER ONLY)
ALT: 23 U/L (ref 0–44)
AST: 32 U/L (ref 15–41)
Albumin: 3.3 g/dL — ABNORMAL LOW (ref 3.5–5.0)
Alkaline Phosphatase: 85 U/L (ref 38–126)
Anion gap: 9 (ref 5–15)
BUN: 14 mg/dL (ref 8–23)
CO2: 26 mmol/L (ref 22–32)
Calcium: 9.5 mg/dL (ref 8.9–10.3)
Chloride: 101 mmol/L (ref 98–111)
Creatinine: 1.04 mg/dL (ref 0.61–1.24)
GFR, Estimated: >60 mL/min (ref >60)
Glucose, Bld: 250 mg/dL — ABNORMAL HIGH (ref 70–99)
Potassium: 4.1 mmol/L (ref 3.5–5.1)
Sodium: 136 mmol/L (ref 135–145)
Total Bilirubin: 0.8 mg/dL (ref 0.3–1.2)
Total Protein: 6.8 g/dL (ref 6.5–8.1)

## 2021-12-28 LAB — CBC WITH DIFFERENTIAL (CANCER CENTER ONLY)
Abs Immature Granulocytes: 0.01 10*3/uL (ref 0.00–0.07)
Basophils Absolute: 0 10*3/uL (ref 0.0–0.1)
Basophils Relative: 1 %
Eosinophils Absolute: 0 10*3/uL (ref 0.0–0.5)
Eosinophils Relative: 1 %
HCT: 37 % — ABNORMAL LOW (ref 39.0–52.0)
Hemoglobin: 11.7 g/dL — ABNORMAL LOW (ref 13.0–17.0)
Immature Granulocytes: 0 %
Lymphocytes Relative: 31 %
Lymphs Abs: 1.3 10*3/uL (ref 0.7–4.0)
MCH: 27.6 pg (ref 26.0–34.0)
MCHC: 31.6 g/dL (ref 30.0–36.0)
MCV: 87.3 fL (ref 80.0–100.0)
Monocytes Absolute: 0.5 10*3/uL (ref 0.1–1.0)
Monocytes Relative: 12 %
Neutro Abs: 2.2 10*3/uL (ref 1.7–7.7)
Neutrophils Relative %: 55 %
Platelet Count: 200 10*3/uL (ref 150–400)
RBC: 4.24 MIL/uL (ref 4.22–5.81)
RDW: 16.1 % — ABNORMAL HIGH (ref 11.5–15.5)
WBC Count: 4 10*3/uL (ref 4.0–10.5)
nRBC: 0 % (ref 0.0–0.2)

## 2021-12-28 LAB — CEA (ACCESS): CEA (CHCC): 38.62 ng/mL — ABNORMAL HIGH (ref 0.00–5.00)

## 2021-12-28 MED ORDER — SODIUM CHLORIDE 0.9% FLUSH
10.0000 mL | INTRAVENOUS | Status: DC | PRN
  Administered 2021-12-28: 10 mL via INTRAVENOUS

## 2021-12-28 MED ORDER — HEPARIN SOD (PORK) LOCK FLUSH 100 UNIT/ML IV SOLN
500.0000 [IU] | Freq: Once | INTRAVENOUS | Status: AC
  Administered 2021-12-28: 500 [IU] via INTRAVENOUS

## 2021-12-28 NOTE — Progress Notes (Signed)
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  Robert Warner will begin home physical therapy soon.  Robert Warner is ambulatory in the home.  Robert Warner is here today with his wife.  No pain.  Good appetite.  Robert Warner continues to have confusion.  No new stroke symptoms.  Robert Warner continues anticoagulation therapy.  Objective:  Vital signs in last 24 hours:  Blood pressure 115/78, pulse 89, temperature 98.1 F (36.7 C), temperature source Oral, resp. rate 19, height 5' 10"  (1.778 m), weight 179 lb (81.2 kg), SpO2 100 %.     Resp: Lungs clear bilaterally Cardio: Regular rate and rhythm GI: No hepatosplenomegaly, no mass, nontender Vascular: Trace edema at the right lower leg Neuro: Alert, follows commands, ambulates to the exam table without difficulty, difficulty with naming the date    Portacath/PICC-without erythema  Lab Results:  Lab Results  Component Value Date   WBC 4.0 12/28/2021   HGB 11.7 (L) 12/28/2021   HCT 37.0 (L) 12/28/2021   MCV 87.3 12/28/2021   PLT 200 12/28/2021   NEUTROABS 2.2 12/28/2021    CMP  Lab Results  Component Value Date   NA 136 12/28/2021   K 4.1 12/28/2021   CL 101 12/28/2021   CO2 26 12/28/2021   GLUCOSE 250 (H) 12/28/2021   BUN 14 12/28/2021   CREATININE 1.04 12/28/2021   CALCIUM 9.5 12/28/2021   PROT 6.8 12/28/2021   ALBUMIN 3.3 (L) 12/28/2021   AST 32 12/28/2021   ALT 23 12/28/2021   ALKPHOS 85 12/28/2021   BILITOT 0.8 12/28/2021   GFRNONAA >60 12/28/2021   GFRAA 95 06/15/2008    Lab Results  Component Value Date   CEA 33.79 (H) 09/29/2021   PJA250 81 (H) 09/29/2021   Medications: I have reviewed the patient's current medications.   Assessment/Plan:  Poorly differentiated adenocarcinoma involving the liver Abdominal ultrasound 08/05/2021-indeterminate 55m solid mass in the right hepatic lobe.   MRI of the liver  09/06/2021-multifocal rim-enhancing lesions throughout both lobes of the liver.  Index lesion within the lateral dome of right hepatic lobe measures 1.1 cm, segment 4A lesion measures 1.6 x 1.4 cm, segment 2 lesion measures 0.8 x 0.7 cm, posteromedial margin of the right hepatic lobe subcapsular lesion measures 2.5 x 2.0 cm 09/16/2021 CT angio neck-9 mm right upper lobe nodule Biopsy of a right liver mass on 09/23/2021.  Pathology shows poorly differentiated adenocarcinoma positive for cytokeratin 7, CDX2 and cytokeratin 20 and negative for TTF-1, PSA and prostein.  Differential diagnoses include pancreatobiliary and less likely upper GI. HER2 positive by FISH PD-L1 combined positive score 0% MSS equivocal, tumor mutation burden 0, ERBB2 amplification equivocal 09/29/2021 CA 19-9 81, CEA 33 PET scan 10/10/2021-solitary 10 mm right upper lobe pulmonary nodule with minimal FDG uptake.  7.5 mm left internal mammary lymph node hypermetabolic with SUV max 45.39  Numerous hepatic lesions.  Periportal lymphadenopathy SUV max 8.18.  2 adjacent lesions noted in the mesentery in the right mid abdomen with somewhat irregular margins, SUV 8.09.  No hypermetabolic colonic lesion identified to suggest a primary colon cancer.  No pancreatic lesion.  No gastric lesion.  No retroperitoneal lymphadenopathy. Upper endoscopy 10/13/2021-no mass, H. pylori gastritis on gastric biopsy Cycle 1 FOLFOX 10/19/2021 Cycle 2 FOLFOX 11/02/2021, Emend added, oxaliplatin dose reduced Cycle 3 FOLFOX 11/16/2021 Cycle 4 FOLFOX 11/30/2021 CT abdomen/pelvis with contrast on 12/05/2021-stable exam with  bilobar hepatic masses, findings suggestive of metastatic gallbladder cancer. Hospital admission 09/16/2021 - 09/19/2021 with gait disturbance-multifocal embolic stroke on MRI 87/11/9939, echo with interatrial shunt, transcranial Doppler with bubble study indicative of a medium size right to left shunt, lower extremity Doppler studies negative for  DVT Hospital admission 09/20/2021 - 09/22/2021 with chest pain-CT coronary with no acute findings.   Diabetes Hypertension BPH Chronic kidney disease Brain CT 11/18/2021-new "lesion" in the left cerebellum when compared to brain MRI 09/16/2021.  Appearance of rapid development suspicious for a subacute infarct.  Metastatic focus not excluded.  Follow-up brain MRI with contrast recommended in the next 4 to 6 weeks. [facility] admission 2/90/4753-[id number] embolic CVAs, Heparin then apixaban anticoagulation 10.  Klebsiella bacteremia 12/05/2021-no apparent source for infection 11.  Thrombocytopenia secondary to chemotherapy and bacteremia 12.  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  Robert Warner completed 4 cycles of FOLFOX chemotherapy.  Robert Warner no longer has abdominal pain.  Robert Warner was recently discharged from a skilled nursing facility.  His performance status has not returned to baseline.  Robert Warner continues to have confusion.  I recommend holding chemotherapy.  Robert Warner will return for reassessment in 2 weeks.  Robert Warner would likely be referred for a restaging evaluation prior to resuming systemic therapy.  [name], MD  12/28/2021  9:41 AM

## 2021-12-28 NOTE — Patient Instructions (Signed)
Implanted Port Removal, Care After °The following information offers guidance on how to care for yourself after your procedure. Your health care provider may also give you more specific instructions. If you have problems or questions, contact your health care provider. °What can I expect after the procedure? °After the procedure, it is common to have: °Soreness or pain near your incision. °Some swelling or bruising near your incision. °Follow these instructions at home: °Medicines °Take over-the-counter and prescription medicines only as told by your health care provider. °If you were prescribed an antibiotic medicine, take it as told by your health care provider. Do not stop taking the antibiotic even if you start to feel better. °Bathing °Do not take baths, swim, or use a hot tub until your health care provider approves. °Ask your health care provider if you can take showers. You may only be allowed to take sponge baths. °Incision care ° °Follow instructions from your health care provider about how to take care of your incision. Make sure you: °Wash your hands with soap and water for at least 20 seconds before and after you change your bandage (dressing). If soap and water are not available, use hand sanitizer. °Change your dressing as told by your health care provider. °Keep your dressing dry. °Leave stitches (sutures), skin glue, or adhesive strips in place. These skin closures may need to stay in place for 2 weeks or longer. If adhesive strip edges start to loosen and curl up, you may trim the loose edges. Do not remove adhesive strips completely unless your health care provider tells you to do that. °Check your incision area every day for signs of infection. Check for: °More redness, swelling, or pain. °More fluid or blood. °Warmth. °Pus or a bad smell. °Activity °Return to your normal activities as told by your health care provider. Ask your health care provider what activities are safe for you. °You may have  to avoid lifting. Ask your health care provider how much you can safely lift. °Do not do activities that involve lifting your arms over your head. °Driving ° °If you were given a sedative during the procedure, it can affect you for several hours. Do not drive or operate machinery until your health care provider says that it is safe. °If you did not receive a sedative, ask your health care provider when it is safe to drive. °General instructions °Do not use any products that contain nicotine or tobacco. These products include cigarettes, chewing tobacco, and vaping devices, such as e-cigarettes. These can delay healing after surgery. If you need help quitting, ask your health care provider. °Keep all follow-up visits. This is important. °Contact a health care provider if: °You have a fever or chills. °You have more redness, swelling, or pain around your incision. °You have more fluid or blood coming from your incision. °Your incision feels warm to the touch. °You have pus or a bad smell coming from your incision. °You have pain that is not relieved by your pain medicine. °Get help right away if: °You have chest pain. °You have difficulty breathing. °These symptoms may be an emergency. Get help right away. Call 911. °Do not wait to see if the symptoms will go away. °Do not drive yourself to the hospital. °Summary °After the procedure, it is common to have pain, soreness, swelling, or bruising near your incision. °If you were prescribed an antibiotic medicine, take it as told by your health care provider. Do not stop taking the antibiotic even if you   start to feel better. °If you were given a sedative during the procedure, it can affect you for several hours. Do not drive or operate machinery until your health care provider says that it is safe. °Return to your normal activities as told by your health care provider. Ask your health care provider what activities are safe for you. °This information is not intended to  replace advice given to you by your health care provider. Make sure you discuss any questions you have with your health care provider. °Document Revised: 05/03/2021 Document Reviewed: 05/03/2021 °Elsevier Patient Education © 2022 [employer] ° °

## 2021-12-28 NOTE — Progress Notes (Signed)
Nutrition follow up completed with patient and wife in exam room. Treatment cancelled today.  Reviewed labs and noted Glucose 250 (non fasting) and albumin 3.3.  Noted recent admission for recurrent CVA.  Weight improved to 179 pounds today from 176.2 pounds on January 18. Wife is preparing 3 meals daily and carefully controls his sugar intake. States blood sugars are improved from the time in the hospital and skilled nursing facility. She would like some easier healthy options to offer patient at breakfast. He still has very little taste and "eats with his eyes" so foods need to be attractive.  Nutrition Diagnosis: Food and Nutrition Related Knowledge Deficit improved.  Intervention: Educated about healthy breakfast foods and lunch options which are easier to prepare to save her time. Encouraged high protein shakes between meals as needed. Examples of healthy snacks discussed. Questions answered. Wife expressed appreciation.  Monitoring, Evaluation, Goals: Patient will tolerate adequate calories and protein for wt stability.  No follow up scheduled. Wife has my contact information for questions.

## 2021-12-29 LAB — CANCER ANTIGEN 19-9: CA 19-9: 117 U/mL — ABNORMAL HIGH (ref 0–35)

## 2021-12-30 ENCOUNTER — Inpatient Hospital Stay: Payer: Medicare HMO

## 2021-12-30 ENCOUNTER — Ambulatory Visit (INDEPENDENT_AMBULATORY_CARE_PROVIDER_SITE_OTHER): Payer: Medicare HMO | Admitting: Neurology

## 2021-12-30 ENCOUNTER — Encounter: Payer: Self-pay | Admitting: Neurology

## 2021-12-30 ENCOUNTER — Encounter: Payer: Self-pay | Admitting: Oncology

## 2021-12-30 VITALS — BP 105/69 | HR 83 | Ht 70.0 in | Wt 180.0 lb

## 2021-12-30 DIAGNOSIS — M21371 Foot drop, right foot: Secondary | ICD-10-CM | POA: Diagnosis not present

## 2021-12-30 DIAGNOSIS — I63433 Cerebral infarction due to embolism of bilateral posterior cerebral arteries: Secondary | ICD-10-CM | POA: Diagnosis not present

## 2021-12-30 NOTE — Progress Notes (Signed)
Chief Complaint  Patient presents with   Rm 15    Pt is here with wife for stroke. He denies any lingering effects. Wife states his short term memory is affected. His stability/balance is still not back to where it was.       ASSESSMENT AND PLAN  Robert Warner is a 81 y.o. male  Right foot drop  Hyporeflexia, new onset urinary urgency, incontinence, and I am not sure this will explained by his multiple small embolic stroke MRI of lumbar spine with without contrast to rule out lumbar pathology Multiple small embolic stroke Left peroneal deep venous thrombosis of unknown age On Eliquis now, Metastatic adenocarcinoma of liver of unknown primary,    DIAGNOSTIC DATA (LABS, IMAGING, TESTING) - I reviewed patient records, labs, notes, testing and imaging myself where available.  Echocardiogram September 17, 2021, showed normal ejection fraction, no valvular disease identified.Agitated saline contrast bubble study was positive with shunting observed within 3-6 cardiac cycles suggestive of interatrial shunt.  Ultrasound of lower extremity demonstrated left peroneal venous thrombosis.  MEDICAL HISTORY:  Robert Warner is a 81 year old male, seen in request by his primary care physician Dr. [name], for evaluation of gait abnormality, stroke follow-up, initial evaluation was on December 30, 2021.  I reviewed and summarized the referring note. PMHX. HTN HLD Metastatic adenocarcinoma to liver of unknown primary  Stroke, 1st Nov 4th 2022  I reviewed multiple office note and previous hospital discharge  First hospital admission in November 2022, presented to for unsteady gait woke up from sleep on September 16, 2021, MRI of the brain reviewed multiple small foci of positive DWI lesions, involving bilateral cerebellum and, cerebral hemisphere consistent with acute embolic event  Echocardiogram showed intra atrium right-to-left shunt confirmed by transcranial Doppler study with  bubble  A1c 6.9, LDL 97, he was supposed to take aspirin, and Plavix, but antiplatelet agent was on hold due to incidental findings of liver mass noted on September 03, 2021  Hospital admission again in January 2023, presented to emergency room for dizziness, poor balance, MRI of the brain showed new embolic event, involving right cerebellum, left splenium,  Doppler study was positive for acute left peroneal venous DVT  He was put on heparin drip, then transition to Eliquis, work-up also showed Klebsiella bacteremia.  He is under oncologist Dr.Sherrill care for poorly differentiated adenocarcinoma of the liver,  PET scan 10/10/2021-solitary 10 mm right upper lobe pulmonary nodule with minimal FDG uptake.  7.5 mm left internal mammary lymph node hypermetabolic with SUV max 0.24.  Numerous hepatic lesions.  Periportal lymphadenopathy SUV max 8.18.  2 adjacent lesions noted in the mesentery in the right mid abdomen with somewhat irregular margins, SUV 8.09.  No hypermetabolic colonic lesion identified to suggest a primary colon cancer.  No pancreatic lesion.  No gastric lesion.  No retroperitoneal lymphadenopathy.   PHYSICAL EXAM:   Vitals:   12/30/21 0812  BP: 105/69  Pulse: 83  Weight: 180 lb (81.6 kg)  Height: 5' 10"  (1.778 m)   Not recorded     Body mass index is 25.83 kg/m.  PHYSICAL EXAMNIATION:  Gen: NAD, conversant, well nourised, well groomed                     Cardiovascular: Regular rate rhythm, no peripheral edema, warm, nontender. Eyes: Conjunctivae clear without exudates or hemorrhage Neck: Supple, no carotid bruits. Pulmonary: Clear to auscultation bilaterally   NEUROLOGICAL EXAM:  MENTAL STATUS: Speech:  Speech is normal; fluent and spontaneous with normal comprehension.  Cognition:     Orientation to time, place and person     Normal recent and remote memory     Normal Attention span and concentration     Normal Language, naming, repeating,spontaneous  speech     Fund of knowledge   CRANIAL NERVES: CN II: Visual fields are full to confrontation. Pupils are round equal and briskly reactive to light. CN III, IV, VI: extraocular movement are normal. No ptosis. CN V: Facial sensation is intact to light touch CN VII: Face is symmetric with normal eye closure  CN VIII: Hearing is normal to causal conversation. CN IX, X: Phonation is normal. CN XI: Head turning and shoulder shrug are intact  MOTOR: mild fixation of left arm Moderate right ankle dorsiflexion, eversion weakness  REFLEXES: Reflexes are 1 and symmetric at the biceps, triceps, knees, and absent at ankles. Plantar responses are flexor.  SENSORY: Intact to light touch, pinprick and vibratory sensation are intact in fingers and toes.  COORDINATION: There is no trunk or limb dysmetria noted.  GAIT/STANCE: right foot drop, could not stand on right heel  REVIEW OF SYSTEMS:  Full 14 system review of systems performed and notable only for as above All other review of systems were negative.   ALLERGIES: No Known Allergies  HOME MEDICATIONS: Current Outpatient Medications  Medication Sig Dispense Refill   Accu-Chek Softclix Lancets lancets Use to test blood sugar levels once a day. 100 each 12   acetaminophen (TYLENOL) 650 MG CR tablet Take 650 mg by mouth every 8 (eight) hours as needed for pain.     apixaban (ELIQUIS) 5 MG TABS tablet Take 1 tablet (5 mg total) by mouth 2 (two) times daily. 60 tablet    atorvastatin (LIPITOR) 40 MG tablet Take 1 tablet (40 mg total) by mouth daily. 90 tablet 1   Blood Glucose Monitoring Suppl (ACCU-CHEK AVIVA PLUS) w/Device KIT Use to check blood sugar once a day 1 kit 0   Cholecalciferol (VITAMIN D3) 2000 UNITS TABS Take 2,000 Units by mouth daily.     Continuous Blood Gluc Receiver (DEXCOM G6 RECEIVER) DEVI Use to check blood sugar levels as directed. 1 each 0   Continuous Blood Gluc Sensor (DEXCOM G6 SENSOR) MISC Use to check blood  glucose levels as directed. 3 each 1   glucose blood test strip Use as instructed 100 each 12   insulin aspart (NOVOLOG) 100 UNIT/ML injection Inject 0-15 Units into the skin 3 (three) times daily with meals. 10 mL 11   insulin glargine, 2 Unit Dial, (TOUJEO MAX SOLOSTAR) 300 UNIT/ML Solostar Pen Inject 25 Units into the skin daily. 3 mL 1   lidocaine-prilocaine (EMLA) cream Apply 1 application topically as needed. Apply 1/2 tablespoon to port site 2 hours prior to stick and cover with Press-and-Seal to numb site for access 30 g 1   metFORMIN (GLUCOPHAGE-XR) 750 MG 24 hr tablet TAKE 1 TABLET BY MOUTH EVERY DAY WITH BREAKFAST 90 tablet 0   ondansetron (ZOFRAN) 8 MG tablet Take 1 tablet (8 mg total) by mouth every 8 (eight) hours as needed for nausea or vomiting. Start 72 hours after each IV chemotherapy administration 30 tablet 1   pantoprazole (PROTONIX) 20 MG tablet TAKE 1 TABLET(20 MG) BY MOUTH DAILY 90 tablet 0   polyethylene glycol (MIRALAX / GLYCOLAX) 17 g packet Take 34 g by mouth daily as needed. 14 each 0   prochlorperazine (COMPAZINE) 10 MG tablet  Take 1 tablet (10 mg total) by mouth every 6 (six) hours as needed for nausea. 60 tablet 1   senna-docusate (SENOKOT-S) 8.6-50 MG tablet Take 2 tablets by mouth 2 (two) times daily. Hold for diarrhea     silodosin (RAPAFLO) 8 MG CAPS capsule Take 8 mg by mouth at bedtime.     traMADol (ULTRAM) 50 MG tablet Take 1 tablet (50 mg total) by mouth every 6 (six) hours as needed for moderate pain. 10 tablet 0   TRAVATAN Z 0.004 % SOLN ophthalmic solution Place 1 drop into both eyes at bedtime.     No current facility-administered medications for this visit.    PAST MEDICAL HISTORY: Past Medical History:  Diagnosis Date   Arthritis    Cancer ([place])    Cataract    Diabetes mellitus    Hyperlipidemia    Stroke [facility])     PAST SURGICAL HISTORY: Past Surgical History:  Procedure Laterality Date   APPENDECTOMY  1968   CATARACT EXTRACTION  Bilateral 07/2000   states 2 weeks ago (first of sept) OD due in Dec    COLONOSCOPY  2004,2009   Negative, Dr. Sharlett Iles    IR IMAGING GUIDED PORT INSERTION  10/14/2021   KIDNEY STONE SURGERY  10/2018   PROSTATE BIOPSY  2006   Dr.Sigmund Tannebaum    FAMILY HISTORY: Family History  Problem Relation Age of Onset   Hypertension Mother    Coronary artery disease Mother    Stroke Father 24   Stroke Sister    Pancreatic cancer Brother    Diabetes Maternal Aunt    Coronary artery disease Maternal Uncle        2 Maternal Uncles    Diabetes Maternal Uncle    Breast cancer Paternal Aunt    Coronary artery disease Paternal Aunt    Coronary artery disease Maternal Grandmother    Stroke Paternal Grandmother    Heart disease Neg Hx    Colon cancer Neg Hx    Esophageal cancer Neg Hx    Rectal cancer Neg Hx    Stomach cancer Neg Hx     SOCIAL HISTORY: Social History   Socioeconomic History   Marital status: Married    Spouse name: Not on file   Number of children: 2   Years of education: Not on file   Highest education level: Not on file  Occupational History   Occupation: retired Airline pilot  Tobacco Use   Smoking status: Former    Packs/day: 0.50    Years: 10.00    Pack years: 5.00    Types: Cigarettes    Quit date: 11/13/1966    Years since quitting: 55.1   Smokeless tobacco: Never  Vaping Use   Vaping Use: Never used  Substance and Sexual Activity   Alcohol use: Not Currently    Alcohol/week: 2.0 standard drinks    Types: 2 Glasses of wine per week    Comment: Occasional wine or beer   Drug use: No   Sexual activity: Not on file  Other Topics Concern   Not on file  Social History Narrative   Lives at home with wife   Right handed   Caffeine: 1 cup occasionally    Social Determinants of Health   Financial Resource Strain: Low Risk    Difficulty of Paying Living Expenses: Not hard at all  Food Insecurity: No Food Insecurity   Worried About Charity fundraiser  in the Last Year: Never true   Ran  Out of Food in the Last Year: Never true  Transportation Needs: No Transportation Needs   Lack of Transportation (Medical): No   Lack of Transportation (Non-Medical): No  Physical Activity: Sufficiently Active   Days of Exercise per Week: 3 days   Minutes of Exercise per Session: 60 min  Stress: No Stress Concern Present   Feeling of Stress : Not at all  Social Connections: Socially Integrated   Frequency of Communication with Friends and Family: Three times a week   Frequency of Social Gatherings with Friends and Family: Three times a week   Attends Religious Services: More than 4 times per year   Active Member of Clubs or Organizations: Yes   Attends Music therapist: More than 4 times per year   Marital Status: Married  Human resources officer Violence: Not At Risk   Fear of Current or Ex-Partner: No   Emotionally Abused: No   Physically Abused: No   Sexually Abused: No   Total time spent reviewing the chart, obtaining history, examined patient, ordering tests, documentation, consultations and family, care coordination was  13 minutes  Marcial Pacas, M.D. Ph.D.  [facility] [address], [place], [place] [address] Ph: [phone] Fax: [phone]  CC:  [name], MD [place],  [place] [address]  [name], MD

## 2022-01-02 ENCOUNTER — Encounter: Payer: Self-pay | Admitting: Neurology

## 2022-01-02 ENCOUNTER — Encounter: Payer: Self-pay | Admitting: Family Medicine

## 2022-01-02 ENCOUNTER — Telehealth: Payer: Self-pay | Admitting: Family Medicine

## 2022-01-02 ENCOUNTER — Telehealth: Payer: Self-pay | Admitting: Neurology

## 2022-01-02 NOTE — Telephone Encounter (Signed)
Pam from [facility] can stated she need a order to follow pt disease management and medication management 1 x wk for 1 wk 2 x a wk for 2 wk's 1 x a wk for 1 wk  and on the next 30 days she will see  him 3 days and also 2 day ask needed. Pam stated she also need order for a Eval for a Education officer, museum.Pam # is [phone].

## 2022-01-02 NOTE — Telephone Encounter (Signed)
Spoke with patient's wife.  She gave me several readings.  He actually had 2 readings down in the 50s over the past week.  We had recently decreased Toujeo from 25 to 20 units.  We will have him reduce this another 5 units to 15 units.  If they have any readings less than 70 over the next few days reduce the Toujeo further to 10 units.  They have Humalog and sliding scale to use if necessary but have not required that over the past week

## 2022-01-02 NOTE — Telephone Encounter (Signed)
Mcarthur Rossetti Josem Kaufmann: [phone] (exp. 01/02/22 to 02/01/22)tricare order sent to [facility] , they will reach out to the patient to schedule.

## 2022-01-02 NOTE — Telephone Encounter (Signed)
Spoke with Pam, verbal okay given.

## 2022-01-02 NOTE — Telephone Encounter (Signed)
Please advise 

## 2022-01-03 ENCOUNTER — Encounter: Payer: Self-pay | Admitting: Internal Medicine

## 2022-01-03 ENCOUNTER — Other Ambulatory Visit: Payer: Self-pay

## 2022-01-03 ENCOUNTER — Ambulatory Visit: Payer: Medicare HMO | Admitting: Internal Medicine

## 2022-01-03 VITALS — BP 112/76 | HR 88 | Temp 98.4°F | Ht 70.0 in | Wt 181.0 lb

## 2022-01-03 DIAGNOSIS — R7881 Bacteremia: Secondary | ICD-10-CM

## 2022-01-03 NOTE — Progress Notes (Unsigned)
RFV: follow up for hospitalization  Patient ID: Robert Warner, male   DOB: 1940/11/22, 81 y.o.   MRN: [id number]  HPI Robert Warner is an 81yo M with history of IG malignancy, admitted in January for klebsiella bacteremia. Recently discharged from [facility] on [facility], now has been home for 10 days. No signs of fever, chills, nightsweats or abdominal pain. Still has on going confusion. Wife reports it has been rough taking care of her husband since they have not had home health RN/PT/OT arranged, that was supposed to happen prior to discharge. He has had some episodes of hypoglycemia.  Outpatient Encounter Medications as of 01/03/2022  Medication Sig   Accu-Chek Softclix Lancets lancets Use to test blood sugar levels once a day.   acetaminophen (TYLENOL) 650 MG CR tablet Take 650 mg by mouth every 8 (eight) hours as needed for pain.   apixaban (ELIQUIS) 5 MG TABS tablet Take 1 tablet (5 mg total) by mouth 2 (two) times daily.   atorvastatin (LIPITOR) 40 MG tablet Take 1 tablet (40 mg total) by mouth daily.   Blood Glucose Monitoring Suppl (ACCU-CHEK AVIVA PLUS) w/Device KIT Use to check blood sugar once a day   Cholecalciferol (VITAMIN D3) 2000 UNITS TABS Take 2,000 Units by mouth daily.   Continuous Blood Gluc Receiver (DEXCOM G6 RECEIVER) DEVI Use to check blood sugar levels as directed.   Continuous Blood Gluc Sensor (DEXCOM G6 SENSOR) MISC Use to check blood glucose levels as directed.   glucose blood test strip Use as instructed   insulin aspart (NOVOLOG) 100 UNIT/ML injection Inject 0-15 Units into the skin 3 (three) times daily with meals.   insulin glargine, 2 Unit Dial, (TOUJEO MAX SOLOSTAR) 300 UNIT/ML Solostar Pen Inject 25 Units into the skin daily.   lidocaine-prilocaine (EMLA) cream Apply 1 application topically as needed. Apply 1/2 tablespoon to port site 2 hours prior to stick and cover with Press-and-Seal to numb site for access   metFORMIN (GLUCOPHAGE-XR) 750  MG 24 hr tablet TAKE 1 TABLET BY MOUTH EVERY DAY WITH BREAKFAST   ondansetron (ZOFRAN) 8 MG tablet Take 1 tablet (8 mg total) by mouth every 8 (eight) hours as needed for nausea or vomiting. Start 72 hours after each IV chemotherapy administration   pantoprazole (PROTONIX) 20 MG tablet TAKE 1 TABLET(20 MG) BY MOUTH DAILY   polyethylene glycol (MIRALAX / GLYCOLAX) 17 g packet Take 34 g by mouth daily as needed.   prochlorperazine (COMPAZINE) 10 MG tablet Take 1 tablet (10 mg total) by mouth every 6 (six) hours as needed for nausea.   senna-docusate (SENOKOT-S) 8.6-50 MG tablet Take 2 tablets by mouth 2 (two) times daily. Hold for diarrhea   silodosin (RAPAFLO) 8 MG CAPS capsule Take 8 mg by mouth at bedtime.   traMADol (ULTRAM) 50 MG tablet Take 1 tablet (50 mg total) by mouth every 6 (six) hours as needed for moderate pain.   TRAVATAN Z 0.004 % SOLN ophthalmic solution Place 1 drop into both eyes at bedtime.   HUMALOG KWIKPEN 200 UNIT/ML KwikPen SMARTSIG:5 Unit(s) SUB-Q Morning-Evening   No facility-administered encounter medications on file as of 01/03/2022.     Patient Active Problem List   Diagnosis Date Noted   Cerebrovascular accident (CVA) due to bilateral embolism of posterior cerebral arteries ([place]) 12/30/2021   Right foot drop 12/30/2021   Bacteremia due to Klebsiella pneumoniae 12/07/2021   DVT in Left peroneal vein 12/07/2021   CVA (cerebral vascular accident) (  Robert Warner) 12/06/2021   AKI (acute kidney injury) ([place]) 12/04/2021   Hyponatremia 12/04/2021   Cancer with unknown primary site [facility]) 10/11/2021   Goals of care, counseling/discussion 10/11/2021   Chest pain 09/21/2021   Recent cerebrovascular accident (CVA) 09/21/2021   Interatrial cardiac shunt 03/49/1791   Acute embolic stroke ([place]) 50/56/9794   Mixed diabetic hyperlipidemia associated with type 2 diabetes mellitus ([place]) 09/16/2021   Liver masses 09/16/2021   Essential hypertension 03/12/2012   Type 2 diabetes mellitus  with hyperglycemia ([place]) 06/15/2008   BPH (benign prostatic hyperplasia) 06/15/2008     Health Maintenance Due  Topic Date Due   Zoster Vaccines- Shingrix (1 of 2) Never done   TETANUS/TDAP  06/15/2018   FOOT EXAM  07/06/2020   URINE MICROALBUMIN  01/31/2022     Review of Systems  Physical Exam   BP 112/76    Pulse 88    Temp 98.4 F (36.9 C) (Oral)    Ht _0  (1.778 m)    Wt 181 lb (82.1 kg)    SpO2 97%    BMI 25.97 kg/m    No results found for: CD4TCELL No results found for: CD4TABS No results found for: HIV1RNAQUANT No results found for: HEPBSAB No results found for: RPR, LABRPR  CBC Lab Results  Component Value Date   WBC 4.0 12/28/2021   RBC 4.24 12/28/2021   HGB 11.7 (L) 12/28/2021   HCT 37.0 (L) 12/28/2021   PLT 200 12/28/2021   MCV 87.3 12/28/2021   MCH 27.6 12/28/2021   MCHC 31.6 12/28/2021   RDW 16.1 (H) 12/28/2021   LYMPHSABS 1.3 12/28/2021   MONOABS 0.5 12/28/2021   EOSABS 0.0 12/28/2021    BMET Lab Results  Component Value Date   NA 136 12/28/2021   K 4.1 12/28/2021   CL 101 12/28/2021   CO2 26 12/28/2021   GLUCOSE 250 (H) 12/28/2021   BUN 14 12/28/2021   CREATININE 1.04 12/28/2021   CALCIUM 9.5 12/28/2021   GFRNONAA >60 12/28/2021   GFRAA 95 06/15/2008      Assessment and Plan  No signs of infection, appears to have cleared his bacteremia.  Return to clinic as needed

## 2022-01-04 ENCOUNTER — Telehealth: Payer: Self-pay | Admitting: Family Medicine

## 2022-01-04 NOTE — Telephone Encounter (Signed)
[facility] lpn with [facility] is calling and would like verbal order for speech therapy evaluation due to CVA

## 2022-01-04 NOTE — Telephone Encounter (Signed)
ATC but could not leave vm  

## 2022-01-04 NOTE — Telephone Encounter (Signed)
Tyson Alias has been given verbal orders

## 2022-01-05 ENCOUNTER — Telehealth: Payer: Self-pay | Admitting: Family Medicine

## 2022-01-05 NOTE — Telephone Encounter (Signed)
Erin with [facility] called in verbal orders for patient. The verbal orders are for Home Health PT. Frequency 1 week 1, 2 week 2, and 1 week 5.  Junie Panning could be contacted at [phone].  Please advise.

## 2022-01-05 NOTE — Telephone Encounter (Signed)
Junie Panning has been given verbal orders

## 2022-01-09 ENCOUNTER — Telehealth: Payer: Self-pay | Admitting: Pharmacist

## 2022-01-09 NOTE — Chronic Care Management (AMB) (Signed)
° ° °Chronic Care Management °Pharmacy Assistant  ° °Name: Robert Warner  MRN: [id number] DOB: 11/15/1940 ° °Reason for Encounter: Medication Review °  °Conditions to be addressed/monitored: °DMII ° °Recent office visits:  °12/02/21 Burchette, Bruce W, MD - Patient presented for Type 2 diabetes and other concerns. Prescribed Insulin Lispro 200 unit/ml ° °Recent consult visits:  °01/03/21 Snider, Cynthia, MD (Inf Disease) - Patient presented for Hospital follow up. No medication changes noted. ° °12/30/21 Yan, Yijun, MD (Neurology) - Patient presented for CVS and other concerns. No medication changes. ° °12/28/21 Sherrill, Gary B, MD (Oncology) - Patient presented for Port A Cath in place. No medication changes. ° °12/23/21 Sherrill, Gary B, MD (Oncology) - Patient presented for Adenocarcinoma determined by biopsy of liver. No medication changes. ° °11/30/21 Thomas, Lisa K, NP (Oncology) - Patient presented for Cancer with unknown primary site. Stopped Metronidazole. ° °11/21/21 Eskridge, Matthew (Urology) - Patient presented for BPH and other concerns. No medication changes noted. ° °11/18/21 Patient presented to [facility] for CT Head W/WO Contrast  ° °Hospital visits:  °Medication Reconciliation was completed by comparing discharge summary, patient’s EMR and Pharmacy list, and upon discussion with patient. ° °Patient presented to [facility] on 12/04/21 due to Acute embolic stroke. Patient was present for 5 days. ° °New?Medications Started at Hospital Discharge:?? ° °apixaban (ELIQUIS) °cefadroxil (DURICEF) °insulin aspart (novoLOG) °polyethylene glycol (MIRALAX / GLYCOLAX) °senna-docusate (Senokot-S) ° °Medication Changes at Hospital Discharge: °-Changed  °Toujeo Max SoloStar ° °Medications Discontinued at Hospital Discharge: °-Stopped  °clopidogrel 75 MG tablet (PLAVIX) °HumaLOG KwikPen 200 UNIT/ML ° °Medications that remain the same after Hospital Discharge:??  °-All other  medications will remain the same.   ° °Medications: °Outpatient Encounter Medications as of 01/09/2022  °Medication Sig  ° Accu-Chek Softclix Lancets lancets Use to test blood sugar levels once a day.  ° acetaminophen (TYLENOL) 650 MG CR tablet Take 650 mg by mouth every 8 (eight) hours as needed for pain.  ° apixaban (ELIQUIS) 5 MG TABS tablet Take 1 tablet (5 mg total) by mouth 2 (two) times daily.  ° atorvastatin (LIPITOR) 40 MG tablet Take 1 tablet (40 mg total) by mouth daily.  ° Blood Glucose Monitoring Suppl (ACCU-CHEK AVIVA PLUS) w/Device KIT Use to check blood sugar once a day  ° Cholecalciferol (VITAMIN D3) 2000 UNITS TABS Take 2,000 Units by mouth daily.  ° Continuous Blood Gluc Receiver (DEXCOM G6 RECEIVER) DEVI Use to check blood sugar levels as directed.  ° Continuous Blood Gluc Sensor (DEXCOM G6 SENSOR) MISC Use to check blood glucose levels as directed.  ° glucose blood test strip Use as instructed  ° HUMALOG KWIKPEN 200 UNIT/ML KwikPen SMARTSIG:5 Unit(s) SUB-Q Morning-Evening  ° insulin aspart (NOVOLOG) 100 UNIT/ML injection Inject 0-15 Units into the skin 3 (three) times daily with meals.  ° insulin glargine, 2 Unit Dial, (TOUJEO MAX SOLOSTAR) 300 UNIT/ML Solostar Pen Inject 25 Units into the skin daily.  ° lidocaine-prilocaine (EMLA) cream Apply 1 application topically as needed. Apply 1/2 tablespoon to port site 2 hours prior to stick and cover with Press-and-Seal to numb site for access  ° metFORMIN (GLUCOPHAGE-XR) 750 MG 24 hr tablet TAKE 1 TABLET BY MOUTH EVERY DAY WITH BREAKFAST  ° ondansetron (ZOFRAN) 8 MG tablet Take 1 tablet (8 mg total) by mouth every 8 (eight) hours as needed for nausea or vomiting. Start 72 hours after each IV chemotherapy administration  ° pantoprazole (PROTONIX) 20 MG tablet TAKE 1   TABLET(20 MG) BY MOUTH DAILY  ° polyethylene glycol (MIRALAX / GLYCOLAX) 17 g packet Take 34 g by mouth daily as needed.  ° prochlorperazine (COMPAZINE) 10 MG tablet Take 1 tablet (10 mg  total) by mouth every 6 (six) hours as needed for nausea.  ° senna-docusate (SENOKOT-S) 8.6-50 MG tablet Take 2 tablets by mouth 2 (two) times daily. Hold for diarrhea  ° silodosin (RAPAFLO) 8 MG CAPS capsule Take 8 mg by mouth at bedtime.  ° traMADol (ULTRAM) 50 MG tablet Take 1 tablet (50 mg total) by mouth every 6 (six) hours as needed for moderate pain.  ° TRAVATAN Z 0.004 % SOLN ophthalmic solution Place 1 drop into both eyes at bedtime.  ° °No facility-administered encounter medications on file as of 01/09/2022.  °Recent Relevant Labs: °Lab Results  °Component Value Date/Time  ° HGBA1C 11.9 (H) 12/04/2021 10:57 PM  ° HGBA1C 6.9 (H) 09/17/2021 05:00 AM  ° HGBA1C 7.4 07/04/2017 12:00 AM  ° MICROALBUR <0.7 01/31/2021 01:41 PM  ° MICROALBUR <0.7 07/07/2019 09:09 AM  °  °Kidney Function °Lab Results  °Component Value Date/Time  ° CREATININE 1.04 12/28/2021 08:57 AM  ° CREATININE 0.95 12/14/2021 10:10 AM  ° GFR 54.73 (L) 09/13/2021 07:19 AM  ° GFRNONAA >60 12/28/2021 08:57 AM  ° GFRAA 95 06/15/2008 12:00 AM  ° ° °Current antihyperglycemic regimen:  °Metformin 750 mg XR 1 tablet daily with breakfast °What recent interventions/DTPs have been made to improve glycemic control:  °Tujeo 25 units, Novolog 100 units °Have there been any recent hospitalizations or ED visits since last visit with CPP? Yes °Patient denies hypoglycemic symptoms, including None °Patient denies hyperglycemic symptoms, including none °How often are you checking your blood sugar? in the morning before eating or drinking °What are your blood sugars ranging?  °Fasting: 135, 90, 83, 126, 113 °Wife reports they have not needed to use toe Novolog or Tujeo recently she reports they are doing well thus far with his sugars. She reports they are currently using the Freestlye Libre 2 since the hospital and he has about 18 days of sensors left. She reports the libre sensors are not covered with humana. Advised that MP can try another route for the Dexcom if  shed like and she was in agreement of that or coverage for Freestlye libre 2 sensors. MP advised. ° °Adherence Review: °Is the patient currently on a STATIN medication? Yes °Is the patient currently on ACE/ARB medication? Yes °Does the patient have >5 day gap between last estimated fill dates? No ° ° ° ° °Care Gaps: °Zoster Vaccine - Overdue °TDAP - Overdue °Foot Exam - Overdue °BP- 112/76 ( 01/03/22) °AWV- 9/22 °CCM- 5/23 °Lab Results  °Component Value Date  ° HGBA1C 11.9 (H) 12/04/2021  ° ° °Star Rating Drugs: °Atorvastatin (Lipitor) 40 mg - Last filled 10/19/21 90 DS at [facility] °Metformin 750 mg - Last filled 11/21/21 90 DS at [facility] ° ° °  °  °Laresia Green CMA °Clinical Pharmacist Assistant °[phone] ° °

## 2022-01-10 NOTE — Progress Notes (Signed)
Per MP advised Mrs Streight to be on the lookout for communication from [place] for [facility] as they have been given her information. Patient aware.   [place] Clinical Pharmacist Assistant [phone]

## 2022-01-11 ENCOUNTER — Telehealth: Payer: Self-pay | Admitting: Family Medicine

## 2022-01-11 NOTE — Telephone Encounter (Signed)
[place] with [facility] is calling and needs verbal orders for 1x8. ?

## 2022-01-12 ENCOUNTER — Inpatient Hospital Stay: Payer: Medicare HMO | Attending: Oncology | Admitting: Oncology

## 2022-01-12 ENCOUNTER — Other Ambulatory Visit: Payer: Self-pay

## 2022-01-12 ENCOUNTER — Other Ambulatory Visit: Payer: Self-pay | Admitting: *Deleted

## 2022-01-12 ENCOUNTER — Encounter: Payer: Self-pay | Admitting: Oncology

## 2022-01-12 VITALS — BP 122/88 | HR 75 | Temp 97.8°F | Resp 18 | Ht 70.0 in | Wt 181.6 lb

## 2022-01-12 DIAGNOSIS — D6959 Other secondary thrombocytopenia: Secondary | ICD-10-CM | POA: Diagnosis not present

## 2022-01-12 DIAGNOSIS — Z86718 Personal history of other venous thrombosis and embolism: Secondary | ICD-10-CM | POA: Diagnosis not present

## 2022-01-12 DIAGNOSIS — I129 Hypertensive chronic kidney disease with stage 1 through stage 4 chronic kidney disease, or unspecified chronic kidney disease: Secondary | ICD-10-CM | POA: Insufficient documentation

## 2022-01-12 DIAGNOSIS — E1122 Type 2 diabetes mellitus with diabetic chronic kidney disease: Secondary | ICD-10-CM | POA: Insufficient documentation

## 2022-01-12 DIAGNOSIS — C787 Secondary malignant neoplasm of liver and intrahepatic bile duct: Secondary | ICD-10-CM | POA: Diagnosis not present

## 2022-01-12 DIAGNOSIS — R7881 Bacteremia: Secondary | ICD-10-CM | POA: Insufficient documentation

## 2022-01-12 DIAGNOSIS — N189 Chronic kidney disease, unspecified: Secondary | ICD-10-CM | POA: Insufficient documentation

## 2022-01-12 DIAGNOSIS — C801 Malignant (primary) neoplasm, unspecified: Secondary | ICD-10-CM | POA: Diagnosis not present

## 2022-01-12 NOTE — Telephone Encounter (Signed)
Left a message for Robert Warner to return my call.  ?

## 2022-01-12 NOTE — Progress Notes (Signed)
?[place] ?OFFICE PROGRESS NOTE ? ? ?Diagnosis: Unknown primary carcinoma ? ?INTERVAL HISTORY:  ? 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  He is making progress with physical therapy.  Good appetite.  No new complaint.  His confusion has improved. ? ?Objective: ? ?Vital signs in last 24 hours: ? ?Blood pressure 122/88, pulse 75, temperature 97.8 ?F (36.6 ?C), temperature source Oral, resp. rate 18, height 5' 10"  (1.778 m), weight 181 lb 9.6 oz (82.4 kg), SpO2 100 %. ?  ? ? ?Lymphatics: No cervical or supraclavicular nodes ?Resp: Lungs clear bilaterally ?Cardio: Regular rate and rhythm ?GI: No hepatosplenomegaly ?Vascular: No leg edema  ? ?Portacath/PICC-without erythema ? ?Lab Results: ? ?Lab Results  ?Component Value Date  ? WBC 4.0 12/28/2021  ? HGB 11.7 (L) 12/28/2021  ? HCT 37.0 (L) 12/28/2021  ? MCV 87.3 12/28/2021  ? PLT 200 12/28/2021  ? NEUTROABS 2.2 12/28/2021  ? ? ?CMP  ?Lab Results  ?Component Value Date  ? NA 136 12/28/2021  ? K 4.1 12/28/2021  ? CL 101 12/28/2021  ? CO2 26 12/28/2021  ? GLUCOSE 250 (H) 12/28/2021  ? BUN 14 12/28/2021  ? CREATININE 1.04 12/28/2021  ? CALCIUM 9.5 12/28/2021  ? PROT 6.8 12/28/2021  ? ALBUMIN 3.3 (L) 12/28/2021  ? AST 32 12/28/2021  ? ALT 23 12/28/2021  ? ALKPHOS 85 12/28/2021  ? BILITOT 0.8 12/28/2021  ? GFRNONAA >60 12/28/2021  ? GFRAA 95 06/15/2008  ? ? ?Lab Results  ?Component Value Date  ? CEA 38.62 (H) 12/28/2021  ? OFB510 117 (H) 12/28/2021  ? ? ?Lab Results  ?Component Value Date  ? INR 1.2 12/04/2021  ? LABPROT 14.7 12/04/2021  ? ? ?Imaging: ? ?No results found. ? ?Medications: I have reviewed the patient's current medications. ? ? ?Assessment/Plan: ?Poorly differentiated adenocarcinoma involving the liver ?Abdominal ultrasound 08/05/2021-indeterminate 83m solid mass in the right hepatic lobe.   ?MRI of the liver 09/06/2021-multifocal rim-enhancing lesions throughout both lobes of the liver.  Index lesion within the lateral  dome of right hepatic lobe measures 1.1 cm, segment 4A lesion measures 1.6 x 1.4 cm, segment 2 lesion measures 0.8 x 0.7 cm, posteromedial margin of the right hepatic lobe subcapsular lesion measures 2.5 x 2.0 cm ?09/16/2021 CT angio neck-9 mm right upper lobe nodule ?Biopsy of a right liver mass on 09/23/2021.  Pathology shows poorly differentiated adenocarcinoma positive for cytokeratin 7, CDX2 and cytokeratin 20 and negative for TTF-1, PSA and prostein.  Differential diagnoses include pancreatobiliary and less likely upper GI. ?HER2 positive by FISH ?PD-L1 combined positive score 0% ?MSS equivocal, tumor mutation burden 0, ERBB2 amplification equivocal ?09/29/2021 CA 19-9 81, CEA 33 ?PET scan 10/10/2021-solitary 10 mm right upper lobe pulmonary nodule with minimal FDG uptake.  7.5 mm left internal mammary lymph node hypermetabolic with SUV max 42.58  Numerous hepatic lesions.  Periportal lymphadenopathy SUV max 8.18.  2 adjacent lesions noted in the mesentery in the right mid abdomen with somewhat irregular margins, SUV 8.09.  No hypermetabolic colonic lesion identified to suggest a primary colon cancer.  No pancreatic lesion.  No gastric lesion.  No retroperitoneal lymphadenopathy. ?Upper endoscopy 10/13/2021-no mass, H. pylori gastritis on gastric biopsy ?Cycle 1 FOLFOX 10/19/2021 ?Cycle 2 FOLFOX 11/02/2021, Emend added, oxaliplatin dose reduced ?Cycle 3 FOLFOX 11/16/2021 ?Cycle 4 FOLFOX 11/30/2021 ?CT abdomen/pelvis with contrast on 12/05/2021-stable exam with bilobar hepatic masses, findings suggestive of metastatic gallbladder cancer. ?Hospital admission 09/16/2021 - 09/19/2021 with gait  disturbance-multifocal embolic stroke on MRI 33/06/3290, echo with interatrial shunt, transcranial Doppler with bubble study indicative of a medium size right to left shunt, lower extremity Doppler studies negative for DVT ?Hospital admission 09/20/2021 - 09/22/2021 with chest pain-CT coronary with no acute findings.    ?Diabetes ?Hypertension ?BPH ?Chronic kidney disease ?Brain CT 11/18/2021-new "lesion" in the left cerebellum when compared to brain MRI 09/16/2021.  Appearance of rapid development suspicious for a subacute infarct.  Metastatic focus not excluded.  Follow-up brain MRI with contrast recommended in the next 4 to 6 weeks. ?[facility] admission 07/30/6059-[id number] embolic CVAs, Heparin then apixaban anticoagulation ?10.  Klebsiella bacteremia 12/05/2021-no apparent source for infection ?11.  Thrombocytopenia secondary to chemotherapy and bacteremia ?12.  Left peroneal DVT 12/05/2021 ?  ? ? ? 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  His performance status has improved since discharge from the hospital in January.  He will continue home physical therapy.  He will undergo a restaging evaluation prior to an office visit in 3 weeks.  We will consider resuming systemic therapy based on the restaging findings. ? ?Robert Coder, MD ? ?01/12/2022  ?11:41 AM ? ? ?

## 2022-01-12 NOTE — Progress Notes (Signed)
Met with Dr and Mrs Reeder and discussed support groups and provided information. Also placed referral for SW and pastoral care ?

## 2022-01-13 NOTE — Telephone Encounter (Signed)
VO given to Alden 

## 2022-01-16 ENCOUNTER — Ambulatory Visit (INDEPENDENT_AMBULATORY_CARE_PROVIDER_SITE_OTHER): Payer: Medicare HMO | Admitting: Family Medicine

## 2022-01-16 ENCOUNTER — Encounter: Payer: Self-pay | Admitting: Family Medicine

## 2022-01-16 VITALS — BP 130/88 | HR 80 | Temp 98.4°F | Ht 70.0 in | Wt 183.5 lb

## 2022-01-16 DIAGNOSIS — E1151 Type 2 diabetes mellitus with diabetic peripheral angiopathy without gangrene: Secondary | ICD-10-CM | POA: Diagnosis not present

## 2022-01-16 DIAGNOSIS — M21612 Bunion of left foot: Secondary | ICD-10-CM | POA: Diagnosis not present

## 2022-01-16 DIAGNOSIS — B351 Tinea unguium: Secondary | ICD-10-CM | POA: Diagnosis not present

## 2022-01-16 DIAGNOSIS — Z794 Long term (current) use of insulin: Secondary | ICD-10-CM | POA: Diagnosis not present

## 2022-01-16 DIAGNOSIS — M21611 Bunion of right foot: Secondary | ICD-10-CM | POA: Diagnosis not present

## 2022-01-16 DIAGNOSIS — H6122 Impacted cerumen, left ear: Secondary | ICD-10-CM | POA: Diagnosis not present

## 2022-01-16 DIAGNOSIS — I70203 Unspecified atherosclerosis of native arteries of extremities, bilateral legs: Secondary | ICD-10-CM | POA: Diagnosis not present

## 2022-01-16 DIAGNOSIS — E1165 Type 2 diabetes mellitus with hyperglycemia: Secondary | ICD-10-CM | POA: Diagnosis not present

## 2022-01-16 DIAGNOSIS — L603 Nail dystrophy: Secondary | ICD-10-CM | POA: Diagnosis not present

## 2022-01-16 NOTE — Progress Notes (Signed)
Established Patient Office Visit  Subjective:  Patient ID: Robert Warner, male    DOB: 1941/11/11  Age: 81 y.o. MRN: [id number]  CC:  Chief Complaint  Patient presents with   Ear Fullness    HPI Robert Warner presents for left ear fullness for the past few days.  He has used Q-tips in the past but wonders if this may have pushed wax down further.  No significant right ear symptoms.  No vertigo.  No drainage.  No pain.  No recent fever or chills.  His diabetes has been improved with insulin.  Recent CBGs fairly consistently in the 100 range.  He had 1 recent reading in the 70s but no significant hypoglycemia.  They are working to get Dexcom monitor approved.  He has malignancy with unknown primary.  Has follow-up CT abdomen pelvis and chest scheduled.  Generally feels well at this time.  No significant pain issues.  Followed closely by oncology.  Past Medical History:  Diagnosis Date   Arthritis    Cancer ([place])    Cataract    Diabetes mellitus    Hyperlipidemia    Stroke [facility])     Past Surgical History:  Procedure Laterality Date   APPENDECTOMY  1968   CATARACT EXTRACTION Bilateral 07/2000   states 2 weeks ago (first of sept) OD due in Dec    COLONOSCOPY  2004,2009   Negative, Dr. Sharlett Iles    IR IMAGING GUIDED PORT INSERTION  10/14/2021   KIDNEY STONE SURGERY  10/2018   PROSTATE BIOPSY  2006   Dr.Sigmund Tannebaum    Family History  Problem Relation Age of Onset   Hypertension Mother    Coronary artery disease Mother    Stroke Father 90   Stroke Sister    Pancreatic cancer Brother    Diabetes Maternal Aunt    Coronary artery disease Maternal Uncle        2 Maternal Uncles    Diabetes Maternal Uncle    Breast cancer Paternal Aunt    Coronary artery disease Paternal Aunt    Coronary artery disease Maternal Grandmother    Stroke Paternal Grandmother    Heart disease Neg Hx    Colon cancer Neg Hx    Esophageal cancer Neg Hx    Rectal cancer Neg Hx     Stomach cancer Neg Hx     Social History   Socioeconomic History   Marital status: Married    Spouse name: Not on file   Number of children: 2   Years of education: Not on file   Highest education level: Not on file  Occupational History   Occupation: retired Airline pilot  Tobacco Use   Smoking status: Former    Packs/day: 0.50    Years: 10.00    Pack years: 5.00    Types: Cigarettes    Quit date: 11/13/1966    Years since quitting: 55.2   Smokeless tobacco: Never  Vaping Use   Vaping Use: Never used  Substance and Sexual Activity   Alcohol use: Not Currently    Alcohol/week: 2.0 standard drinks    Types: 2 Glasses of wine per week    Comment: Occasional wine or beer   Drug use: No   Sexual activity: Not on file  Other Topics Concern   Not on file  Social History Narrative   Lives at home with wife   Right handed   Caffeine: 1 cup occasionally    Social Determinants of Health  Financial Resource Strain: Low Risk    Difficulty of Paying Living Expenses: Not hard at all  Food Insecurity: No Food Insecurity   Worried About Charity fundraiser in the Last Year: Never true   Ran Out of Food in the Last Year: Never true  Transportation Needs: No Transportation Needs   Lack of Transportation (Medical): No   Lack of Transportation (Non-Medical): No  Physical Activity: Sufficiently Active   Days of Exercise per Week: 3 days   Minutes of Exercise per Session: 60 min  Stress: No Stress Concern Present   Feeling of Stress : Not at all  Social Connections: Socially Integrated   Frequency of Communication with Friends and Family: Three times a week   Frequency of Social Gatherings with Friends and Family: Three times a week   Attends Religious Services: More than 4 times per year   Active Member of Clubs or Organizations: Yes   Attends Music therapist: More than 4 times per year   Marital Status: Married  Human resources officer Violence: Not At Risk   Fear of  Current or Ex-Partner: No   Emotionally Abused: No   Physically Abused: No   Sexually Abused: No    Outpatient Medications Prior to Visit  Medication Sig Dispense Refill   Accu-Chek Softclix Lancets lancets Use to test blood sugar levels once a day. 100 each 12   acetaminophen (TYLENOL) 650 MG CR tablet Take 650 mg by mouth every 8 (eight) hours as needed for pain.     apixaban (ELIQUIS) 5 MG TABS tablet Take 1 tablet (5 mg total) by mouth 2 (two) times daily. 60 tablet    atorvastatin (LIPITOR) 40 MG tablet Take 1 tablet (40 mg total) by mouth daily. 90 tablet 1   Blood Glucose Monitoring Suppl (ACCU-CHEK AVIVA PLUS) w/Device KIT Use to check blood sugar once a day 1 kit 0   Cholecalciferol (VITAMIN D3) 2000 UNITS TABS Take 2,000 Units by mouth daily.     Continuous Blood Gluc Receiver (DEXCOM G6 RECEIVER) DEVI Use to check blood sugar levels as directed. 1 each 0   Continuous Blood Gluc Sensor (DEXCOM G6 SENSOR) MISC Use to check blood glucose levels as directed. 3 each 1   glucose blood test strip Use as instructed 100 each 12   insulin aspart (NOVOLOG) 100 UNIT/ML injection Inject 0-15 Units into the skin 3 (three) times daily with meals. 10 mL 11   insulin glargine, 2 Unit Dial, (TOUJEO MAX SOLOSTAR) 300 UNIT/ML Solostar Pen Inject 25 Units into the skin daily. 3 mL 1   lidocaine-prilocaine (EMLA) cream Apply 1 application topically as needed. Apply 1/2 tablespoon to port site 2 hours prior to stick and cover with Press-and-Seal to numb site for access 30 g 1   metFORMIN (GLUCOPHAGE-XR) 750 MG 24 hr tablet TAKE 1 TABLET BY MOUTH EVERY DAY WITH BREAKFAST 90 tablet 0   ondansetron (ZOFRAN) 8 MG tablet Take 1 tablet (8 mg total) by mouth every 8 (eight) hours as needed for nausea or vomiting. Start 72 hours after each IV chemotherapy administration 30 tablet 1   pantoprazole (PROTONIX) 20 MG tablet TAKE 1 TABLET(20 MG) BY MOUTH DAILY 90 tablet 0   polyethylene glycol (MIRALAX / GLYCOLAX) 17  g packet Take 34 g by mouth daily as needed. 14 each 0   prochlorperazine (COMPAZINE) 10 MG tablet Take 1 tablet (10 mg total) by mouth every 6 (six) hours as needed for nausea. 60 tablet 1  senna-docusate (SENOKOT-S) 8.6-50 MG tablet Take 2 tablets by mouth 2 (two) times daily. Hold for diarrhea     silodosin (RAPAFLO) 8 MG CAPS capsule Take 8 mg by mouth at bedtime.     traMADol (ULTRAM) 50 MG tablet Take 1 tablet (50 mg total) by mouth every 6 (six) hours as needed for moderate pain. 10 tablet 0   TRAVATAN Z 0.004 % SOLN ophthalmic solution Place 1 drop into both eyes at bedtime.     HUMALOG KWIKPEN 200 UNIT/ML KwikPen SMARTSIG:5 Unit(s) SUB-Q Morning-Evening     No facility-administered medications prior to visit.    No Known Allergies  ROS Review of Systems  Constitutional:  Negative for chills and fever.  HENT:  Negative for ear discharge, ear pain and sore throat.      Objective:    Physical Exam Vitals reviewed.  Constitutional:      Appearance: Normal appearance.  HENT:     Ears:     Comments: Cerumen impaction left canal occluding ear canal.  Right canal reveals minimal cerumen.  Eardrum appears normal. Cardiovascular:     Rate and Rhythm: Normal rate.  Neurological:     Mental Status: He is alert.    BP 130/88 (BP Location: Left Arm, Patient Position: Sitting, Cuff Size: Normal)    Pulse 80    Temp 98.4 F (36.9 C) (Oral)    Ht 5' 10"  (1.778 m)    Wt 183 lb 8 oz (83.2 kg)    SpO2 98%    BMI 26.33 kg/m  Wt Readings from Last 3 Encounters:  01/16/22 183 lb 8 oz (83.2 kg)  01/12/22 181 lb 9.6 oz (82.4 kg)  01/03/22 181 lb (82.1 kg)     Health Maintenance Due  Topic Date Due   Zoster Vaccines- Shingrix (1 of 2) Never done   TETANUS/TDAP  06/15/2018   FOOT EXAM  07/06/2020   URINE MICROALBUMIN  01/31/2022    There are no preventive care reminders to display for this patient.  Lab Results  Component Value Date   TSH 0.465 12/04/2021   Lab Results   Component Value Date   WBC 4.0 12/28/2021   HGB 11.7 (L) 12/28/2021   HCT 37.0 (L) 12/28/2021   MCV 87.3 12/28/2021   PLT 200 12/28/2021   Lab Results  Component Value Date   NA 136 12/28/2021   K 4.1 12/28/2021   CO2 26 12/28/2021   GLUCOSE 250 (H) 12/28/2021   BUN 14 12/28/2021   CREATININE 1.04 12/28/2021   BILITOT 0.8 12/28/2021   ALKPHOS 85 12/28/2021   AST 32 12/28/2021   ALT 23 12/28/2021   PROT 6.8 12/28/2021   ALBUMIN 3.3 (L) 12/28/2021   CALCIUM 9.5 12/28/2021   ANIONGAP 9 12/28/2021   GFR 54.73 (L) 09/13/2021   Lab Results  Component Value Date   CHOL 118 12/05/2021   Lab Results  Component Value Date   HDL 35 (L) 12/05/2021   Lab Results  Component Value Date   LDLCALC 59 12/05/2021   Lab Results  Component Value Date   TRIG 120 12/05/2021   Lab Results  Component Value Date   CHOLHDL 3.4 12/05/2021   Lab Results  Component Value Date   HGBA1C 11.9 (H) 12/04/2021      Assessment & Plan:   #1 cerumen impaction left ear canal affected hearing.  We discussed risk of irrigation including risk of pain, low risk of bleeding, low risk of perforation.  Patient consents.  Left ear irrigated and patient tolerated well.  Large plug of cerumen removed and patient could hear better afterwards.  No complications.  #2 type 2 diabetes.  Currently on insulin.  They are on combination with Toujeo and NovoLog sliding scale with meals.  Blood sugar much improved.  Currently working to get Dexcom approved  No orders of the defined types were placed in this encounter.   Follow-up: No follow-ups on file.    [name], MD

## 2022-01-17 ENCOUNTER — Encounter: Payer: Self-pay | Admitting: Licensed Clinical Social Worker

## 2022-01-17 NOTE — Progress Notes (Signed)
[facility]  ?Initial Assessment ? ? ?Robert Warner is a 81 y.o. year old male contacted by phone. Clinical Social Work was referred by nurse navigator for assessment of psychosocial needs.  ? ?SDOH (Social Determinants of Health) assessments performed: No ?  ?Distress Screen completed: Yes ?No flowsheet data found. ? ? ? ?Family/Social Information:  ?Housing Arrangement: patient lives with spouse,  Robert Warner, Robert Warner (Spouse) [phone] ?Family members/support persons in your life? Family, Medical Staff, Friends/Colleagues, Church, and Community  ?Transportation concerns: no, spouse Ms. Portilla provides main transportation, daughter Robert Warner is back-up ?Employment: Retired. Income source: [place] [employer] ?Financial concerns: No ?Type of concern: None ?Food access concerns: no ?Religious or spiritual practice: yes ?Services Currently in place:  [employer] ? ?Coping/ Adjustment to diagnosis: ?Patient understands treatment plan and what happens next? yes ?Concerns about diagnosis and/or treatment:  No ?Patient reported stressors: Anxiety, Adjusting to my illness, and CSW spoke with patient's main caregiver, Ms. Able who stated she would like to meet with CSW to process and discuss different issues.  ?Hopes and priorities: Patient is currently feeling well. ?Patient enjoys being outside, time with family/ friends, and church communities ?Current coping skills/ strengths: Ability for insight , Average or above average intelligence , Capable of independent living , Motivation for treatment/growth , Religious Affiliation , and Supportive family/friends  ? ? ? SUMMARY: ?Current SDOH Barriers:  ?Patient is currently not experiencing concerns but his caregiver Ms. Bayer, would like to participate in counseling with CSW. ? ?Clinical Social Work Clinical Goal(s):  ?patient will work with SW to address concerns related to processing treatment and  diagnosis. ? ?Interventions: ?Discussed common feeling and emotions when being diagnosed with cancer, and the importance of support during treatment ?Informed patient of the support team roles and support services at [facility] ?Provided CSW contact information and encouraged patient to call with any questions or concerns ?Provided patient with information about CSW role in patient care and available resources and CSW counseling availability. ? ? ?Follow Up Plan:  CSW scheduled appointment on 01/24/2022 at 2:00PM, with Ms. Hennon, primary caregiver. ?Patient verbalizes understanding of plan: Yes ? ? ? ?Janani Chamber, LCSW ?

## 2022-01-23 ENCOUNTER — Other Ambulatory Visit: Payer: Self-pay

## 2022-01-23 ENCOUNTER — Observation Stay (HOSPITAL_COMMUNITY): Payer: Medicare HMO

## 2022-01-23 ENCOUNTER — Encounter: Payer: Self-pay | Admitting: Licensed Clinical Social Worker

## 2022-01-23 ENCOUNTER — Emergency Department (HOSPITAL_BASED_OUTPATIENT_CLINIC_OR_DEPARTMENT_OTHER): Payer: Medicare HMO

## 2022-01-23 ENCOUNTER — Inpatient Hospital Stay (HOSPITAL_BASED_OUTPATIENT_CLINIC_OR_DEPARTMENT_OTHER)
Admission: EM | Admit: 2022-01-23 | Discharge: 2022-01-27 | DRG: 065 | Disposition: A | Discharge status: Rehabilitation Facility | Payer: Medicare HMO | Attending: Internal Medicine | Admitting: Internal Medicine

## 2022-01-23 ENCOUNTER — Encounter (HOSPITAL_BASED_OUTPATIENT_CLINIC_OR_DEPARTMENT_OTHER): Payer: Self-pay | Admitting: *Deleted

## 2022-01-23 DIAGNOSIS — Z803 Family history of malignant neoplasm of breast: Secondary | ICD-10-CM

## 2022-01-23 DIAGNOSIS — T451X5A Adverse effect of antineoplastic and immunosuppressive drugs, initial encounter: Secondary | ICD-10-CM | POA: Diagnosis present

## 2022-01-23 DIAGNOSIS — Z87891 Personal history of nicotine dependence: Secondary | ICD-10-CM | POA: Diagnosis not present

## 2022-01-23 DIAGNOSIS — N3000 Acute cystitis without hematuria: Secondary | ICD-10-CM | POA: Diagnosis not present

## 2022-01-23 DIAGNOSIS — E785 Hyperlipidemia, unspecified: Secondary | ICD-10-CM | POA: Diagnosis present

## 2022-01-23 DIAGNOSIS — Z713 Dietary counseling and surveillance: Secondary | ICD-10-CM | POA: Diagnosis not present

## 2022-01-23 DIAGNOSIS — I63511 Cerebral infarction due to unspecified occlusion or stenosis of right middle cerebral artery: Secondary | ICD-10-CM | POA: Diagnosis not present

## 2022-01-23 DIAGNOSIS — E119 Type 2 diabetes mellitus without complications: Secondary | ICD-10-CM | POA: Diagnosis not present

## 2022-01-23 DIAGNOSIS — Z833 Family history of diabetes mellitus: Secondary | ICD-10-CM

## 2022-01-23 DIAGNOSIS — Z8673 Personal history of transient ischemic attack (TIA), and cerebral infarction without residual deficits: Secondary | ICD-10-CM | POA: Diagnosis not present

## 2022-01-23 DIAGNOSIS — Q248 Other specified congenital malformations of heart: Secondary | ICD-10-CM | POA: Diagnosis not present

## 2022-01-23 DIAGNOSIS — Z0389 Encounter for observation for other suspected diseases and conditions ruled out: Secondary | ICD-10-CM | POA: Diagnosis not present

## 2022-01-23 DIAGNOSIS — E1122 Type 2 diabetes mellitus with diabetic chronic kidney disease: Secondary | ICD-10-CM | POA: Diagnosis not present

## 2022-01-23 DIAGNOSIS — I639 Cerebral infarction, unspecified: Secondary | ICD-10-CM | POA: Diagnosis not present

## 2022-01-23 DIAGNOSIS — R29898 Other symptoms and signs involving the musculoskeletal system: Secondary | ICD-10-CM

## 2022-01-23 DIAGNOSIS — D6869 Other thrombophilia: Secondary | ICD-10-CM | POA: Diagnosis not present

## 2022-01-23 DIAGNOSIS — Z8249 Family history of ischemic heart disease and other diseases of the circulatory system: Secondary | ICD-10-CM

## 2022-01-23 DIAGNOSIS — I63431 Cerebral infarction due to embolism of right posterior cerebral artery: Secondary | ICD-10-CM | POA: Diagnosis not present

## 2022-01-23 DIAGNOSIS — I69312 Visuospatial deficit and spatial neglect following cerebral infarction: Secondary | ICD-10-CM | POA: Diagnosis not present

## 2022-01-23 DIAGNOSIS — R531 Weakness: Secondary | ICD-10-CM

## 2022-01-23 DIAGNOSIS — D6859 Other primary thrombophilia: Secondary | ICD-10-CM | POA: Diagnosis not present

## 2022-01-23 DIAGNOSIS — I6340 Cerebral infarction due to embolism of unspecified cerebral artery: Secondary | ICD-10-CM | POA: Diagnosis not present

## 2022-01-23 DIAGNOSIS — E1169 Type 2 diabetes mellitus with other specified complication: Secondary | ICD-10-CM | POA: Diagnosis not present

## 2022-01-23 DIAGNOSIS — N183 Chronic kidney disease, stage 3 unspecified: Secondary | ICD-10-CM | POA: Diagnosis present

## 2022-01-23 DIAGNOSIS — N390 Urinary tract infection, site not specified: Secondary | ICD-10-CM | POA: Diagnosis present

## 2022-01-23 DIAGNOSIS — I82552 Chronic embolism and thrombosis of left peroneal vein: Secondary | ICD-10-CM

## 2022-01-23 DIAGNOSIS — Z823 Family history of stroke: Secondary | ICD-10-CM

## 2022-01-23 DIAGNOSIS — Z79899 Other long term (current) drug therapy: Secondary | ICD-10-CM

## 2022-01-23 DIAGNOSIS — E1165 Type 2 diabetes mellitus with hyperglycemia: Secondary | ICD-10-CM | POA: Diagnosis not present

## 2022-01-23 DIAGNOSIS — R29702 NIHSS score 2: Secondary | ICD-10-CM | POA: Diagnosis present

## 2022-01-23 DIAGNOSIS — Z794 Long term (current) use of insulin: Secondary | ICD-10-CM | POA: Diagnosis not present

## 2022-01-23 DIAGNOSIS — I129 Hypertensive chronic kidney disease with stage 1 through stage 4 chronic kidney disease, or unspecified chronic kidney disease: Secondary | ICD-10-CM | POA: Diagnosis present

## 2022-01-23 DIAGNOSIS — I63442 Cerebral infarction due to embolism of left cerebellar artery: Secondary | ICD-10-CM | POA: Diagnosis present

## 2022-01-23 DIAGNOSIS — Z87442 Personal history of urinary calculi: Secondary | ICD-10-CM | POA: Diagnosis not present

## 2022-01-23 DIAGNOSIS — C787 Secondary malignant neoplasm of liver and intrahepatic bile duct: Secondary | ICD-10-CM | POA: Diagnosis not present

## 2022-01-23 DIAGNOSIS — Z86718 Personal history of other venous thrombosis and embolism: Secondary | ICD-10-CM | POA: Diagnosis not present

## 2022-01-23 DIAGNOSIS — Z7984 Long term (current) use of oral hypoglycemic drugs: Secondary | ICD-10-CM

## 2022-01-23 DIAGNOSIS — H9193 Unspecified hearing loss, bilateral: Secondary | ICD-10-CM | POA: Diagnosis not present

## 2022-01-23 DIAGNOSIS — G8194 Hemiplegia, unspecified affecting left nondominant side: Secondary | ICD-10-CM | POA: Diagnosis not present

## 2022-01-23 DIAGNOSIS — I69354 Hemiplegia and hemiparesis following cerebral infarction affecting left non-dominant side: Secondary | ICD-10-CM | POA: Diagnosis not present

## 2022-01-23 DIAGNOSIS — I82409 Acute embolism and thrombosis of unspecified deep veins of unspecified lower extremity: Secondary | ICD-10-CM | POA: Diagnosis present

## 2022-01-23 DIAGNOSIS — Z79891 Long term (current) use of opiate analgesic: Secondary | ICD-10-CM | POA: Diagnosis not present

## 2022-01-23 DIAGNOSIS — Z20822 Contact with and (suspected) exposure to covid-19: Secondary | ICD-10-CM | POA: Diagnosis present

## 2022-01-23 DIAGNOSIS — D6959 Other secondary thrombocytopenia: Secondary | ICD-10-CM | POA: Diagnosis not present

## 2022-01-23 DIAGNOSIS — M1990 Unspecified osteoarthritis, unspecified site: Secondary | ICD-10-CM | POA: Diagnosis not present

## 2022-01-23 DIAGNOSIS — Z6826 Body mass index (BMI) 26.0-26.9, adult: Secondary | ICD-10-CM | POA: Diagnosis not present

## 2022-01-23 DIAGNOSIS — N400 Enlarged prostate without lower urinary tract symptoms: Secondary | ICD-10-CM | POA: Diagnosis not present

## 2022-01-23 DIAGNOSIS — C801 Malignant (primary) neoplasm, unspecified: Secondary | ICD-10-CM | POA: Diagnosis not present

## 2022-01-23 DIAGNOSIS — A499 Bacterial infection, unspecified: Secondary | ICD-10-CM | POA: Diagnosis not present

## 2022-01-23 DIAGNOSIS — Z7901 Long term (current) use of anticoagulants: Secondary | ICD-10-CM | POA: Diagnosis not present

## 2022-01-23 DIAGNOSIS — I63319 Cerebral infarction due to thrombosis of unspecified middle cerebral artery: Secondary | ICD-10-CM | POA: Diagnosis not present

## 2022-01-23 DIAGNOSIS — I63131 Cerebral infarction due to embolism of right carotid artery: Secondary | ICD-10-CM | POA: Diagnosis not present

## 2022-01-23 DIAGNOSIS — E669 Obesity, unspecified: Secondary | ICD-10-CM | POA: Diagnosis not present

## 2022-01-23 DIAGNOSIS — R29818 Other symptoms and signs involving the nervous system: Secondary | ICD-10-CM | POA: Diagnosis not present

## 2022-01-23 DIAGNOSIS — Z800 Family history of malignant neoplasm of digestive organs: Secondary | ICD-10-CM

## 2022-01-23 DIAGNOSIS — E663 Overweight: Secondary | ICD-10-CM | POA: Diagnosis present

## 2022-01-23 DIAGNOSIS — I63411 Cerebral infarction due to embolism of right middle cerebral artery: Secondary | ICD-10-CM | POA: Diagnosis not present

## 2022-01-23 LAB — URINALYSIS, ROUTINE W REFLEX MICROSCOPIC
Bilirubin Urine: NEGATIVE
Glucose, UA: NEGATIVE mg/dL
Ketones, ur: NEGATIVE mg/dL
Nitrite: POSITIVE — AB
Protein, ur: 30 mg/dL — AB
Specific Gravity, Urine: 1.016 (ref 1.005–1.030)
WBC, UA: >50 WBC/hpf — ABNORMAL HIGH (ref 0–5)
pH: 6 (ref 5.0–8.0)

## 2022-01-23 LAB — APTT: aPTT: 28 seconds (ref 24–36)

## 2022-01-23 LAB — COMPREHENSIVE METABOLIC PANEL
ALT: 33 U/L (ref 0–44)
AST: 38 U/L (ref 15–41)
Albumin: 3.8 g/dL (ref 3.5–5.0)
Alkaline Phosphatase: 130 U/L — ABNORMAL HIGH (ref 38–126)
Anion gap: 7 (ref 5–15)
BUN: 24 mg/dL — ABNORMAL HIGH (ref 8–23)
CO2: 28 mmol/L (ref 22–32)
Calcium: 10 mg/dL (ref 8.9–10.3)
Chloride: 102 mmol/L (ref 98–111)
Creatinine, Ser: 1.22 mg/dL (ref 0.61–1.24)
GFR, Estimated: 59 mL/min — ABNORMAL LOW (ref >60)
Glucose, Bld: 123 mg/dL — ABNORMAL HIGH (ref 70–99)
Potassium: 4.4 mmol/L (ref 3.5–5.1)
Sodium: 137 mmol/L (ref 135–145)
Total Bilirubin: 0.8 mg/dL (ref 0.3–1.2)
Total Protein: 7.3 g/dL (ref 6.5–8.1)

## 2022-01-23 LAB — CBC
HCT: 37.1 % — ABNORMAL LOW (ref 39.0–52.0)
Hemoglobin: 11.9 g/dL — ABNORMAL LOW (ref 13.0–17.0)
MCH: 28.5 pg (ref 26.0–34.0)
MCHC: 32.1 g/dL (ref 30.0–36.0)
MCV: 89 fL (ref 80.0–100.0)
Platelets: 155 10*3/uL (ref 150–400)
RBC: 4.17 MIL/uL — ABNORMAL LOW (ref 4.22–5.81)
RDW: 15.1 % (ref 11.5–15.5)
WBC: 4.7 10*3/uL (ref 4.0–10.5)
nRBC: 0 % (ref 0.0–0.2)

## 2022-01-23 LAB — RAPID URINE DRUG SCREEN, HOSP PERFORMED
Amphetamines: NOT DETECTED
Barbiturates: NOT DETECTED
Benzodiazepines: NOT DETECTED
Cocaine: NOT DETECTED
Opiates: NOT DETECTED
Tetrahydrocannabinol: NOT DETECTED

## 2022-01-23 LAB — RESP PANEL BY RT-PCR (FLU A&B, COVID) ARPGX2
Influenza A by PCR: NEGATIVE
Influenza B by PCR: NEGATIVE
SARS Coronavirus 2 by RT PCR: NEGATIVE

## 2022-01-23 LAB — PROTIME-INR
INR: 1 (ref 0.8–1.2)
Prothrombin Time: 13.6 seconds (ref 11.4–15.2)

## 2022-01-23 LAB — ETHANOL: Alcohol, Ethyl (B): <10 mg/dL (ref <10)

## 2022-01-23 LAB — GLUCOSE, CAPILLARY
Glucose-Capillary: 237 mg/dL — ABNORMAL HIGH (ref 70–99)
Glucose-Capillary: 186 mg/dL — ABNORMAL HIGH (ref 70–99)

## 2022-01-23 LAB — DIFFERENTIAL
Abs Immature Granulocytes: 0.01 10*3/uL (ref 0.00–0.07)
Basophils Absolute: 0 10*3/uL (ref 0.0–0.1)
Basophils Relative: 1 %
Eosinophils Absolute: 0.1 10*3/uL (ref 0.0–0.5)
Eosinophils Relative: 3 %
Immature Granulocytes: 0 %
Lymphocytes Relative: 28 %
Lymphs Abs: 1.3 10*3/uL (ref 0.7–4.0)
Monocytes Absolute: 0.5 10*3/uL (ref 0.1–1.0)
Monocytes Relative: 11 %
Neutro Abs: 2.6 10*3/uL (ref 1.7–7.7)
Neutrophils Relative %: 57 %

## 2022-01-23 LAB — CBG MONITORING, ED: Glucose-Capillary: 115 mg/dL — ABNORMAL HIGH (ref 70–99)

## 2022-01-23 IMAGING — MR MR HEAD WO/W CM
16 of 18 series · 42 of 48 positions shown · IV contrast (Gadavist)
Comparison: MRI head [DATE], correlation is made with CT and
CTA head [DATE]

CLINICAL DATA: Neuro deficit, stroke suspected, weakness and off
balance since waking this morning

EXAM:
MRI HEAD WITHOUT AND WITH CONTRAST
TECHNIQUE: Multiplanar, multiecho pulse sequences of the brain and surrounding
structures were obtained without and with intravenous contrast.
CONTRAST:  8mL GADAVIST GADOBUTROL 1 MMOL/ML IV SOLN

[Series 5: DWI · axial · 3.0mm · 0.92mm/px · z∈[-86,+80]mm · 5 of 114 slices shown (1 of 4)]
[im 1/114]
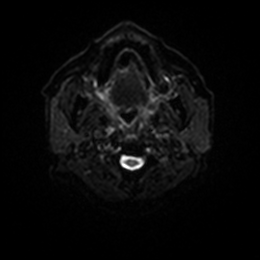
[im 29/114]
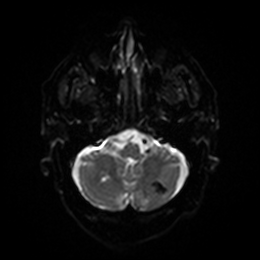
[im 57/114]
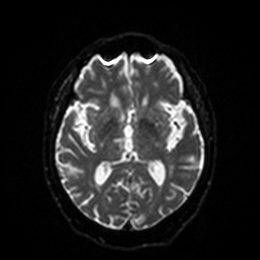
[im 85/114]
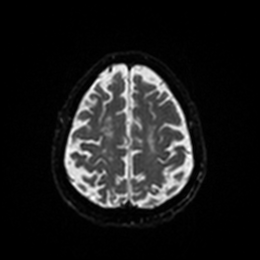
[im 114/114]
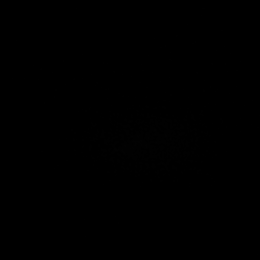

[Series 6: DWI · axial · 3.0mm · 0.92mm/px · z∈[-86,+77]mm · 3 of 56 slices shown (2 of 4)]
[im 1/56]
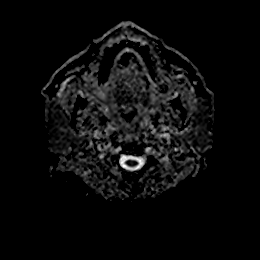
[im 28/56]
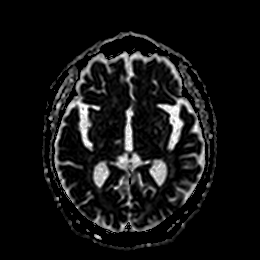
[im 56/56]
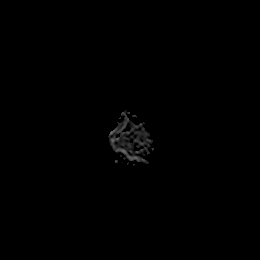

[Series 7: DWI · coronal · 4.0mm · 0.88mm/px · 4 of 84 slices shown (3 of 4)]
[im 1/84]
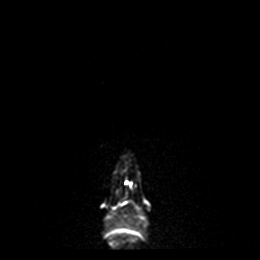
[im 28/84]
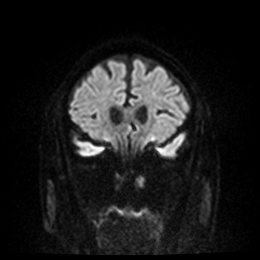
[im 56/84]
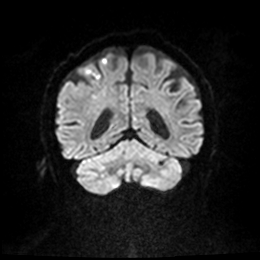
[im 84/84]
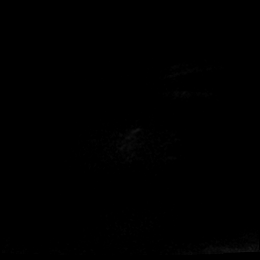

[Series 8: DWI · coronal · 4.0mm · 0.88mm/px · 2 of 42 slices shown (4 of 4)]
[im 1/42]
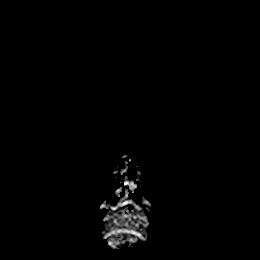
[im 42/42]
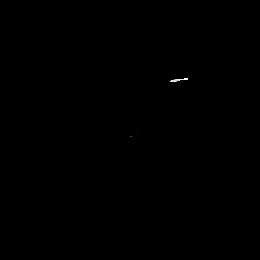

[Series 9: T1 · sagittal · 5.0mm · 0.78mm/px · 2 of 29 slices shown]
[im 1/29]
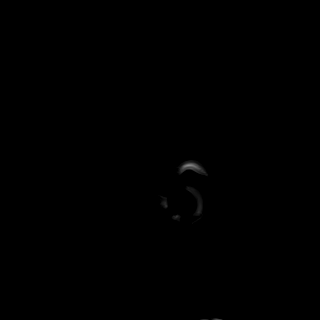
[im 29/29]
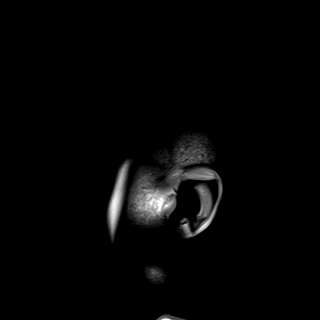

[Series 10: T2 · axial · 5.0mm · 0.75mm/px · z∈[-86,+80]mm · 2 of 29 slices shown]
[im 1/29]
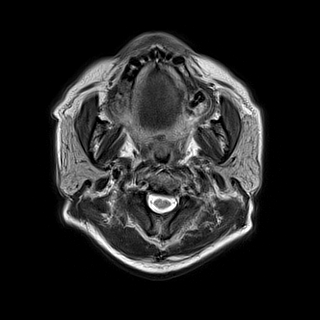
[im 29/29]
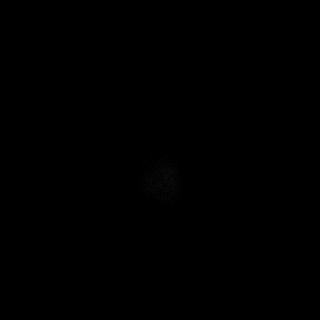

[Series 11: FLAIR · axial · 5.0mm · 0.47mm/px · z∈[-87,+79]mm · 2 of 29 slices shown]
[im 1/29]
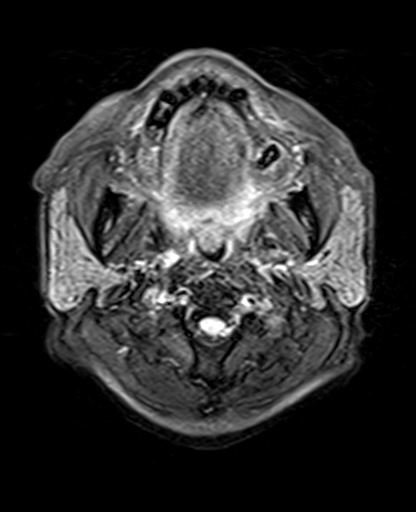
[im 29/29]
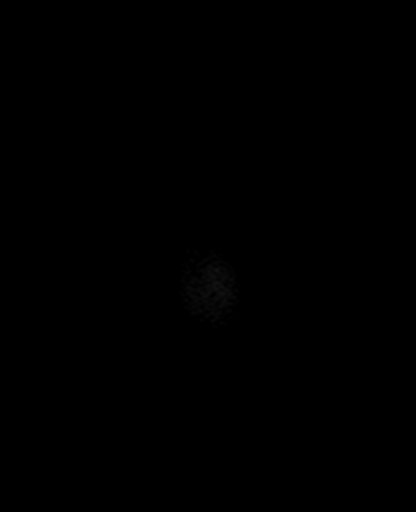

[Series 12: mag_images · axial · 3.0mm · 0.94mm/px · z∈[-91,+84]mm · 3 of 60 slices shown]
[im 1/60]
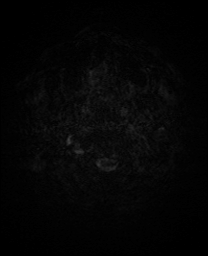
[im 30/60]
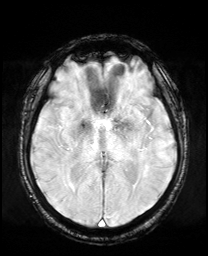
[im 60/60]
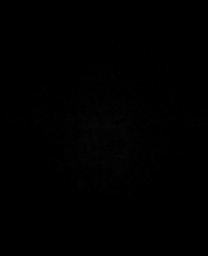

[Series 13: pha_images · axial · 3.0mm · 0.94mm/px · z∈[-88,+81]mm · 3 of 56 slices shown]
[im 1/56]
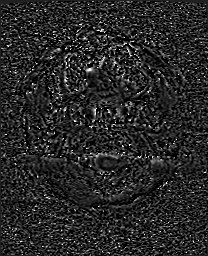
[im 28/56]
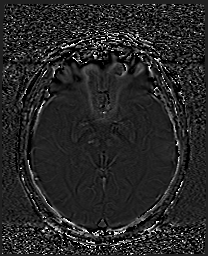
[im 56/56]
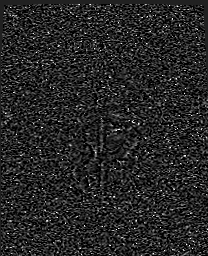

[Series 14: swi_images · axial · 3.0mm · 0.94mm/px · z∈[-91,+84]mm · 3 of 60 slices shown]
[im 1/60]
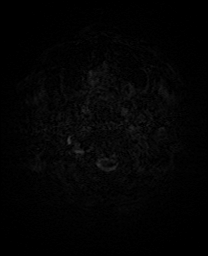
[im 30/60]
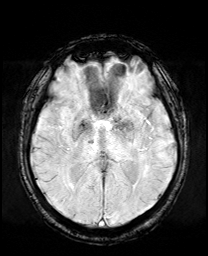
[im 60/60]
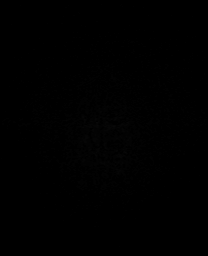

[Series 15: mip_images(sw) · axial · 24.0mm · 0.94mm/px · z∈[-81,+73]mm · 3 of 53 slices shown]
[im 1/53]
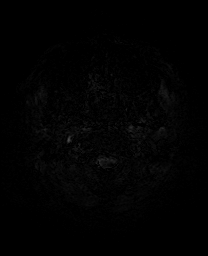
[im 27/53]
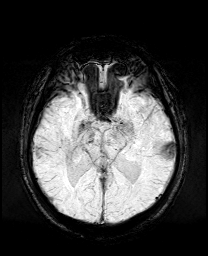
[im 53/53]
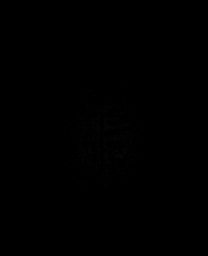

[Series 17: T2 post-contrast · coronal · 4.0mm · 0.72mm/px · 2 of 42 slices shown (1 of 2)]
[im 1/42]
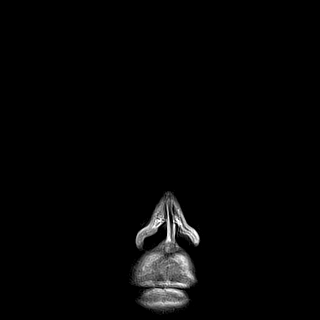
[im 42/42]
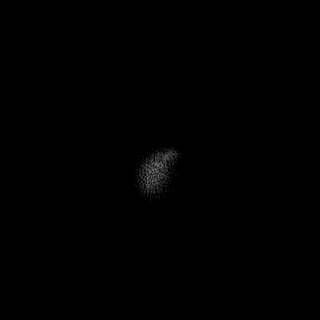

[Series 18: T1 post-contrast · coronal · 4.0mm · 0.34mm/px · 2 of 42 slices shown (1 of 3)]
[im 1/42]
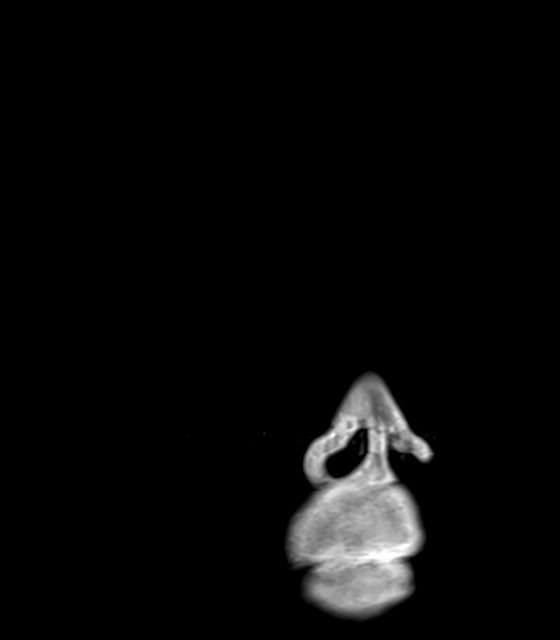
[im 42/42]
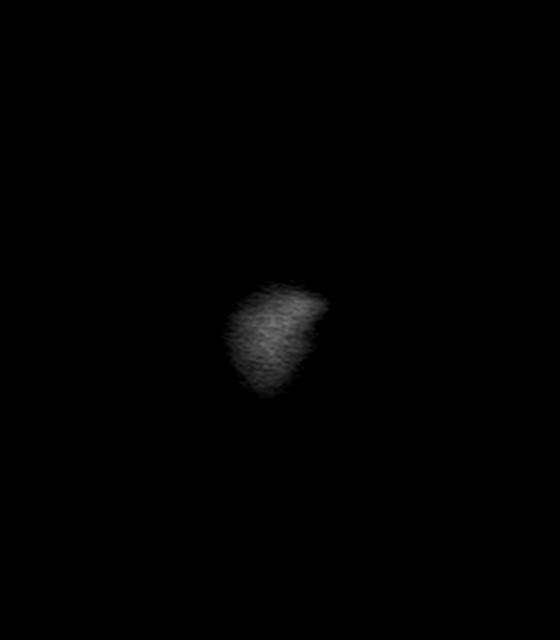

[Series 19: T1 post-contrast · sagittal · 5.0mm · 0.75mm/px · 2 of 29 slices shown (2 of 3)]
[im 1/29]
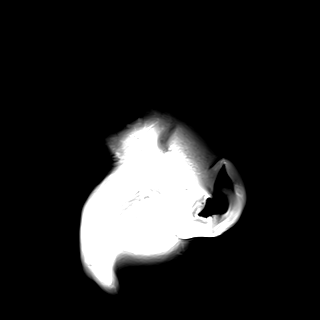
[im 29/29]
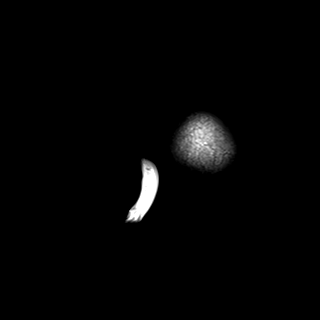

[Series 21: T1 post-contrast · coronal · 4.0mm · 0.36mm/px · 2 of 42 slices shown (3 of 3)]
[im 1/42]
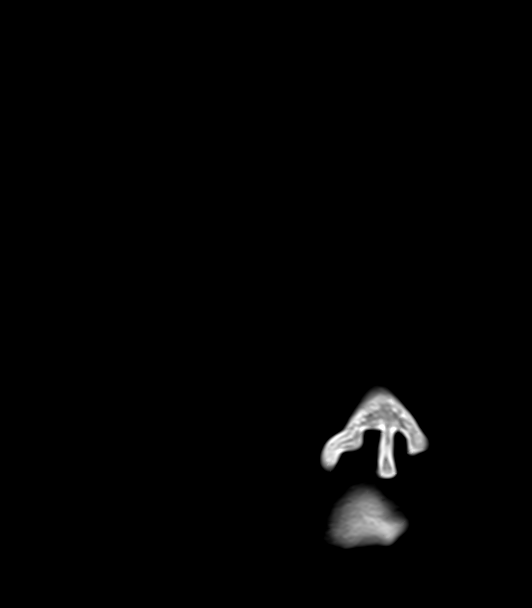
[im 42/42]
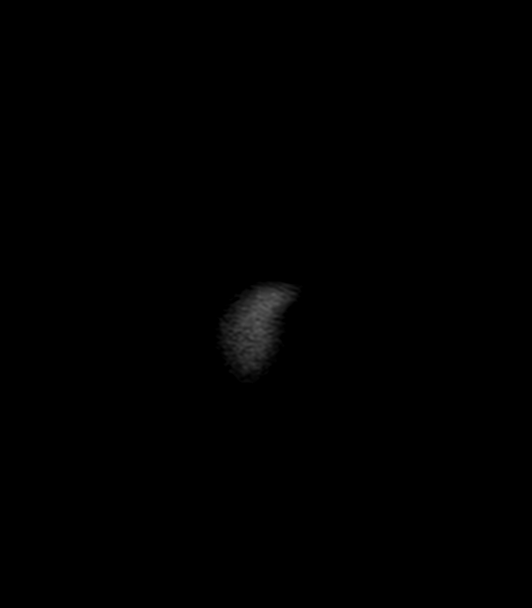

[Series 22: T2 post-contrast · coronal · 4.0mm · 0.72mm/px · 2 of 42 slices shown (2 of 2)]
[im 1/42]
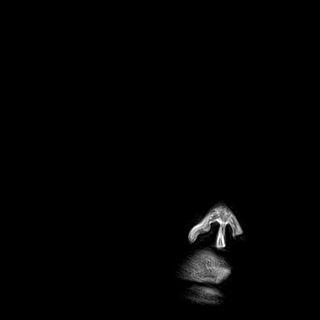
[im 42/42]
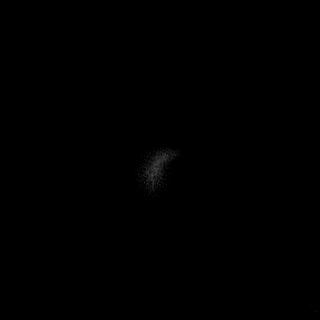

[42 of 48 positions shown; findings below may reference images not displayed]

FINDINGS: Evaluation is somewhat limited by motion artifact. Brain: Numerous
mostly punctate foci of restricted diffusion in the
right-greater-than-left cerebral hemispheres (series 5, images 82,
88-105, 108) and left cerebellar hemisphere (series 5, images 68 and
75). The largest area of restricted diffusion is in the posterior
right frontal lobe (series 5, image 94).

No acute hemorrhage, mass, mass effect, or midline shift. Confluent
T2 hyperintense signal in the periventricular white matter and pons,
likely the sequela of moderate to severe chronic small vessel
ischemic disease. Lacunar infarcts in the right thalamus and
bilateral cerebellar hemispheres.

Hemosiderin deposition from remote petechial hemorrhage in the
left-greater-than-right cerebellum, bilateral thalami, and left
occipital lobe.

No hydrocephalus or extra-axial collection. Area of enhancement in
the left inferior cerebellum (series 20, image 13), favored to
represent a subacute infarct. No other parenchymal enhancement.

Vascular: Normal flow voids.

Skull and upper cervical spine: Redemonstrated heterogeneous marrow
signal. Degenerative changes in the cervical spine.

Sinuses/Orbits: No acute finding. Status post bilateral lens
replacements.

Other: The mastoids are well aerated.
IMPRESSION: 1. Multiple, mostly punctate acute infarcts in the
right-greater-than-left cerebral hemispheres and left cerebellar
hemisphere, with a larger area of infarction in the posterior right
frontal lobe. Given multiple vascular territories an embolic
etiology is favored.
2. Area of enhancement in the left cerebellar hemisphere, which is
favored to represent a subacute infarct.

## 2022-01-23 IMAGING — CT CT ANGIO HEAD-NECK (W OR W/O PERF)
1 of 12 series · 13 of 47 positions shown · non-contrast
Comparison: CT head [DATE], CTA [DATE].

CLINICAL DATA: Stroke suspected

EXAM:
CT ANGIOGRAPHY HEAD AND NECK
TECHNIQUE: Multidetector CT imaging of the head and neck was performed using
the standard protocol during bolus administration of intravenous
contrast. Multiplanar CT image reconstructions and MIPs were
obtained to evaluate the vascular anatomy. Carotid stenosis
measurements (when applicable) are obtained utilizing NASCET
criteria, using the distal internal carotid diameter as the
denominator.

[Series 8: thin · axial · 0.50mm/px · z∈[-24,+284]mm · 13 of 720 slices shown]
[im 52/720  brain]
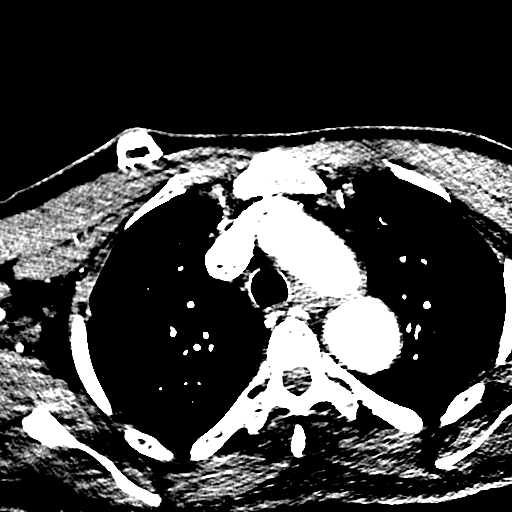
[im 103/720  bone]
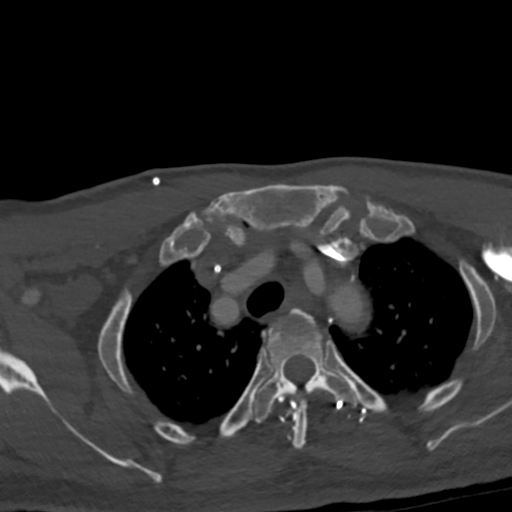
[im 155/720  brain]
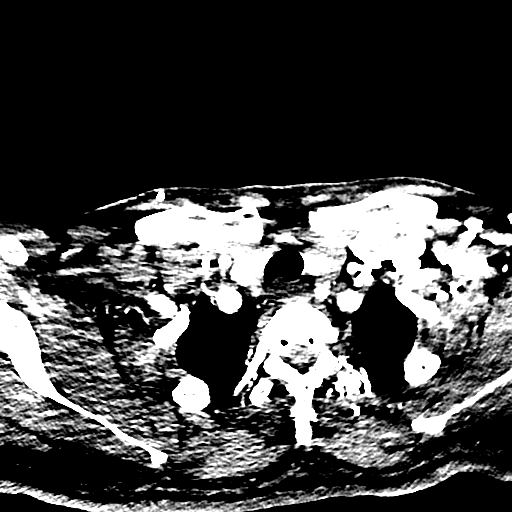
[im 206/720  bone]
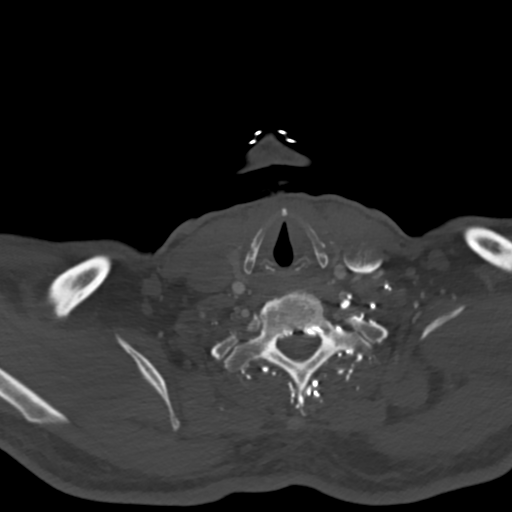
[im 257/720  brain]
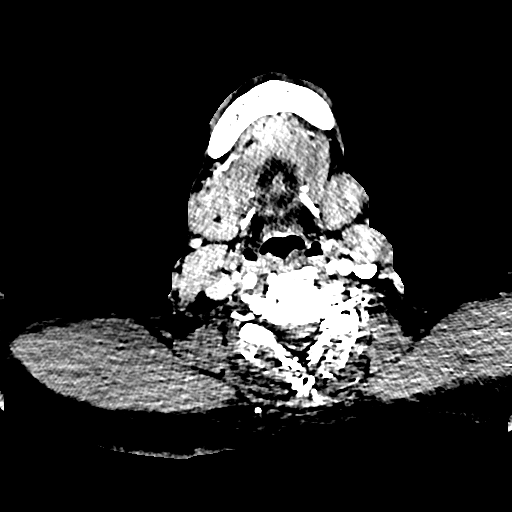
[im 309/720  bone]
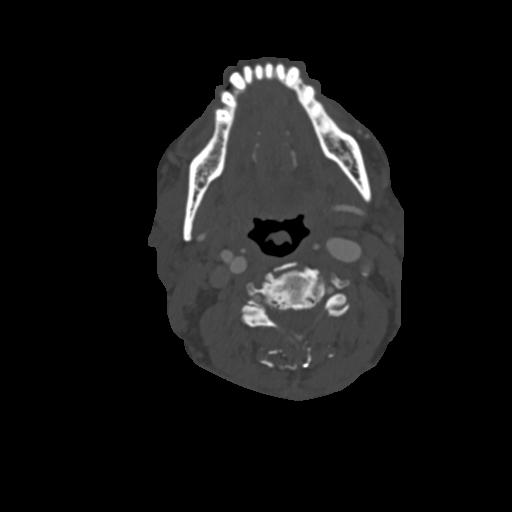
[im 360/720  brain]
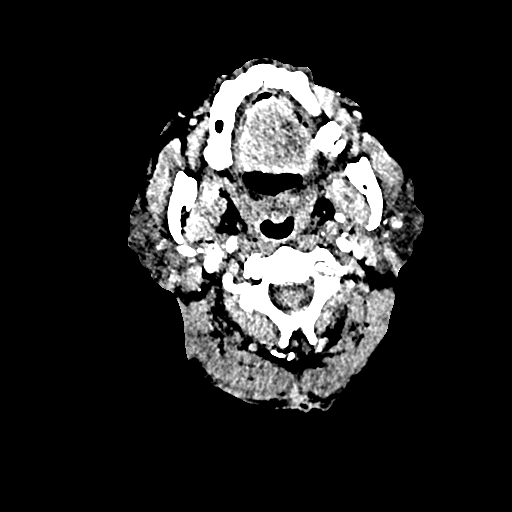
[im 411/720  bone]
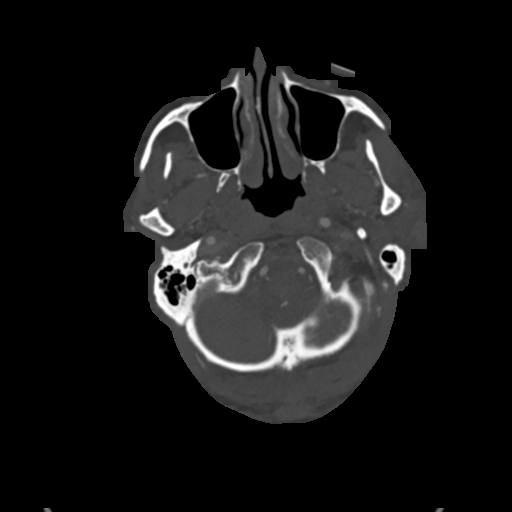
[im 463/720  brain]
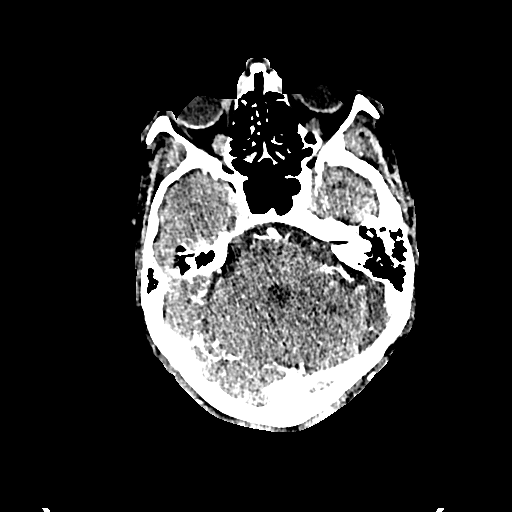
[im 514/720  bone]
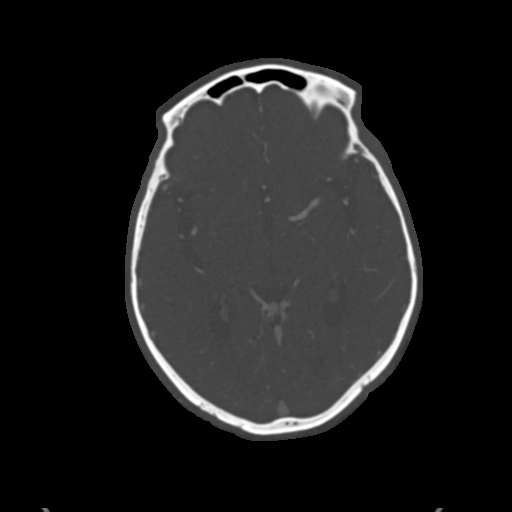
[im 565/720  brain]
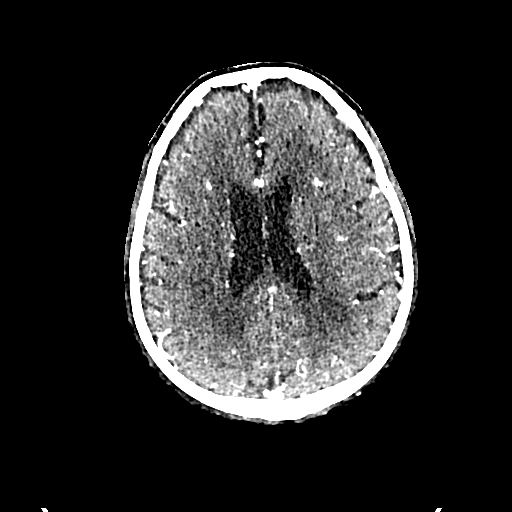
[im 617/720  bone]
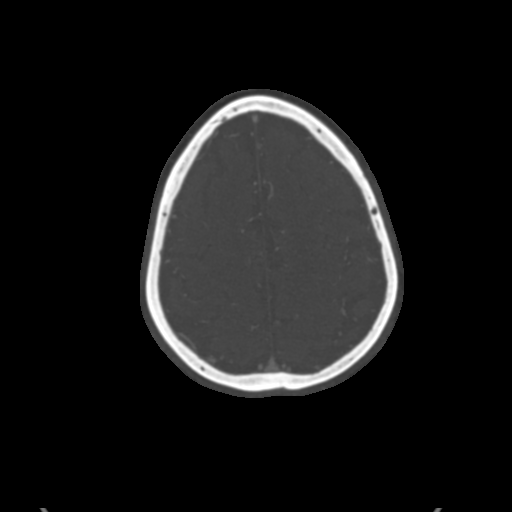
[im 668/720  brain]
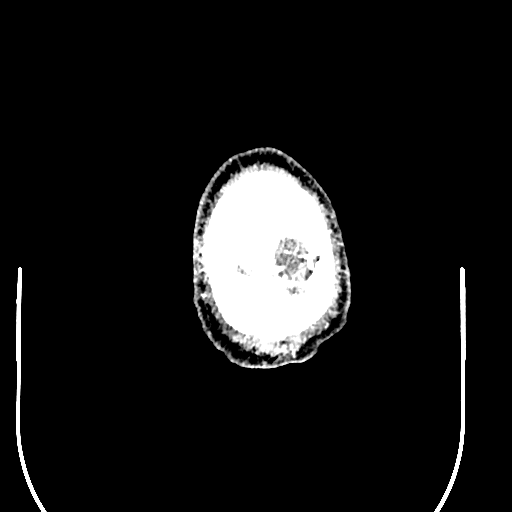

[13 of 47 positions shown; findings below may reference images not displayed]

RADIATION DOSE REDUCTION: This exam was performed according to the
departmental dose-optimization program which includes automated
exposure control, adjustment of the mA and/or kV according to
patient size and/or use of iterative reconstruction technique.

CONTRAST:  75mL OMNIPAQUE IOHEXOL 350 MG/ML SOLN
FINDINGS: CT HEAD FINDINGS

Brain: No evidence of acute infarction, hemorrhage, cerebral edema,
mass, mass effect, or midline shift. No hydrocephalus or extra-axial
fluid collection. Periventricular white matter changes, likely the
sequela of chronic small vessel ischemic disease. Remote lacunar
infarcts in the right thalamus, bilateral basal ganglia, and
bilateral cerebellar hemispheres.

Vascular: No hyperdense vessel.

Skull: Normal. Negative for fracture or focal lesion.

Sinuses/Orbits: No acute finding.

Other: The mastoid air cells are well aerated.

CTA NECK FINDINGS

Aortic arch: Two-vessel arch with a common origin of the
brachiocephalic and left common carotid arteries. Imaged portion
shows no evidence of aneurysm or dissection. No significant stenosis
of the major arch vessel origins.

Right carotid system: No evidence of dissection, stenosis (50% or
greater) or occlusion.

Left carotid system: No evidence of dissection, stenosis (50% or
greater) or occlusion.

Vertebral arteries: No evidence of dissection, stenosis (50% or
greater) or occlusion.

Skeleton: No acute osseous abnormality. Degenerative changes in the
cervical spine.

Other neck: No acute finding.

Upper chest: Right upper lobe nodule measures up to 11 x 12 mm in
the axial plane (series 8, image 681), previously 8 x 9 mm when
remeasured similarly. Other nodular opacity superior to this mass
are also unchanged (series 8, image 653). No pleural effusion.

Review of the MIP images confirms the above findings

CTA HEAD FINDINGS

Anterior circulation: Both internal carotid arteries are patent to
the termini, without significant stenosis.

Hypoplastic right A1. A1 segments patent. Normal anterior
communicating artery. Anterior cerebral arteries are patent to their
distal aspects.

No M1 stenosis or occlusion. Normal MCA bifurcations. Distal MCA
branches perfused and symmetric.

Posterior circulation: Vertebral arteries patent to the
vertebrobasilar junction without stenosis. Posterior inferior
cerebral arteries patent bilaterally.

Basilar patent to its distal aspect. Superior cerebellar arteries
patent bilaterally.

Patent P1 segments. PCAs perfused to their distal aspects without
stenosis. The left posterior communicating artery is not visualized.
The right posterior communicating artery is patent.

Venous sinuses: As permitted by contrast timing, patent.

Anatomic variants: None significant.

Review of the MIP images confirms the above findings
IMPRESSION: 1.  No intracranial large vessel occlusion or significant stenosis.
2.  No hemodynamically significant stenosis in the neck.
3. Interval increase in the size of the right upper lobe nodule,
which now measures up to 12 mm, previously 9 mm on [DATE].

## 2022-01-23 MED ORDER — INSULIN GLARGINE-YFGN 100 UNIT/ML ~~LOC~~ SOLN
20.0000 [IU] | Freq: Every day | SUBCUTANEOUS | Status: DC
  Administered 2022-01-23 – 2022-01-25 (×3): 20 [IU] via SUBCUTANEOUS
  Filled 2022-01-23 (×3): qty 0.2

## 2022-01-23 MED ORDER — STROKE: EARLY STAGES OF RECOVERY BOOK
Freq: Once | Status: AC
  Filled 2022-01-23: qty 1

## 2022-01-23 MED ORDER — LATANOPROST 0.005 % OP SOLN
1.0000 [drp] | Freq: Every day | OPHTHALMIC | Status: DC
Start: 2022-01-23 — End: 2022-01-27
  Administered 2022-01-23 – 2022-01-26 (×4): 1 [drp] via OPHTHALMIC
  Filled 2022-01-23: qty 2.5

## 2022-01-23 MED ORDER — ATORVASTATIN CALCIUM 40 MG PO TABS
40.0000 mg | ORAL_TABLET | Freq: Every day | ORAL | Status: DC
  Administered 2022-01-23 – 2022-01-27 (×5): 40 mg via ORAL
  Filled 2022-01-23 (×5): qty 1

## 2022-01-23 MED ORDER — APIXABAN 5 MG PO TABS
5.0000 mg | ORAL_TABLET | Freq: Two times a day (BID) | ORAL | Status: DC
  Administered 2022-01-23 – 2022-01-24 (×2): 5 mg via ORAL
  Filled 2022-01-23 (×2): qty 1

## 2022-01-23 MED ORDER — IOHEXOL 350 MG/ML SOLN
75.0000 mL | Freq: Once | INTRAVENOUS | Status: AC | PRN
Start: 2022-01-23 — End: 2022-01-23
  Administered 2022-01-23: 75 mL via INTRAVENOUS

## 2022-01-23 MED ORDER — SODIUM CHLORIDE 0.9 % IV SOLN
1.0000 g | Freq: Once | INTRAVENOUS | Status: AC
  Administered 2022-01-23: 1 g via INTRAVENOUS
  Filled 2022-01-23: qty 10

## 2022-01-23 MED ORDER — INSULIN ASPART 100 UNIT/ML IJ SOLN
0.0000 [IU] | Freq: Three times a day (TID) | INTRAMUSCULAR | Status: DC
  Administered 2022-01-24: 2 [IU] via SUBCUTANEOUS
  Administered 2022-01-25: 1 [IU] via SUBCUTANEOUS
  Administered 2022-01-25 – 2022-01-26 (×2): 3 [IU] via SUBCUTANEOUS
  Administered 2022-01-26: 1 [IU] via SUBCUTANEOUS
  Administered 2022-01-27: 3 [IU] via SUBCUTANEOUS

## 2022-01-23 MED ORDER — PANTOPRAZOLE SODIUM 20 MG PO TBEC
20.0000 mg | DELAYED_RELEASE_TABLET | Freq: Every day | ORAL | Status: DC
  Administered 2022-01-23 – 2022-01-27 (×5): 20 mg via ORAL
  Filled 2022-01-23 (×5): qty 1

## 2022-01-23 MED ORDER — TRAMADOL HCL 50 MG PO TABS
50.0000 mg | ORAL_TABLET | Freq: Four times a day (QID) | ORAL | Status: DC | PRN
Start: 2022-01-23 — End: 2022-01-27
  Administered 2022-01-26 – 2022-01-27 (×2): 50 mg via ORAL
  Filled 2022-01-23 (×2): qty 1

## 2022-01-23 MED ORDER — VITAMIN D 25 MCG (1000 UNIT) PO TABS
2000.0000 [IU] | ORAL_TABLET | Freq: Every day | ORAL | Status: DC
  Administered 2022-01-23 – 2022-01-27 (×5): 2000 [IU] via ORAL
  Filled 2022-01-23 (×5): qty 2

## 2022-01-23 MED ORDER — SODIUM CHLORIDE 0.9 % IV SOLN
1.0000 g | INTRAVENOUS | Status: DC
  Administered 2022-01-24 – 2022-01-27 (×4): 1 g via INTRAVENOUS
  Filled 2022-01-23 (×4): qty 10

## 2022-01-23 MED ORDER — POLYETHYLENE GLYCOL 3350 17 G PO PACK: 17.0000 g | PACK | Freq: Every day | ORAL | Status: DC | PRN

## 2022-01-23 MED ORDER — ASPIRIN EC 81 MG PO TBEC
81.0000 mg | DELAYED_RELEASE_TABLET | Freq: Every day | ORAL | Status: DC
  Administered 2022-01-23 – 2022-01-24 (×2): 81 mg via ORAL
  Filled 2022-01-23 (×2): qty 1

## 2022-01-23 MED ORDER — TAMSULOSIN HCL 0.4 MG PO CAPS
0.4000 mg | ORAL_CAPSULE | Freq: Every day | ORAL | Status: DC
  Administered 2022-01-24 – 2022-01-26 (×3): 0.4 mg via ORAL
  Filled 2022-01-23 (×3): qty 1

## 2022-01-23 MED ORDER — ACETAMINOPHEN 325 MG PO TABS
650.0000 mg | ORAL_TABLET | Freq: Three times a day (TID) | ORAL | Status: DC | PRN
  Administered 2022-01-26: 650 mg via ORAL
  Filled 2022-01-23: qty 2

## 2022-01-23 NOTE — Progress Notes (Signed)
Plan of Care Note for accepted transfer ? ? ?Patient: Robert Warner MRN: [id number]   DOA: 01/23/2022 ? ?Facility requesting transfer: [facility] ?Requesting Provider: Thamas Jaegers, MD ?Reason for transfer: Left arm weakness.  ?Facility course: 81 year old male with a past medical history of unknown primary site cancer, interatrial cardiac shunt, liver masses, hypertension, type II DM, BPH, hyponatremia, left peroneal vein DVT, right foot drop, history of multiple CVA who is coming to the emergency department due to gait imbalance and left-sided weakness.  Teleneurology has recommended TIA/CVA work-up as suggested TEE.  Please see full consult for further details. ? ?Plan of care: ?The patient is accepted for admission to Telemetry unit, at [facility]..  ? ?Author: ?Reubin Milan, MD ?01/23/2022 ? ?Check [email or web address] for on-call coverage. ? ?Nursing staff, Please call [facility] number on Amion as soon as patient's arrival, so appropriate admitting provider can evaluate the pt. ?

## 2022-01-23 NOTE — ED Triage Notes (Signed)
Pt states he feels off balance and weak since waking up. Feels weaker on the left arm. Woke up around 0530. Went to bed around 2030. Hx of previous strokes, on Eliquis. Pt says he stumbled this morning in the bathroom, did not hit his head.  ?

## 2022-01-23 NOTE — ED Notes (Signed)
Patient given meatloaf meal and vanilla ensure with MD approval for food.  ?

## 2022-01-23 NOTE — ED Provider Notes (Signed)
6:55 AM ? ?Patient arrived with concern for acute weakness.  Went to bed around 8:30 PM and was at his normal state of health.  Woke up this morning feeling like he had difficulty walking and had left sided weakness.  No speech or vision deficits noted.  Of note, recent hospital admission with multiple embolic infarcts.  He is currently on Eliquis.  On my exam, he is awake, alert, oriented.  No facial droop noted.  Cranial nerves appear intact.  He has slightly reduced grip strength on the left and drift in the left lower extremity with 4+ out of 5 strength.  Gait testing was deferred.  He is out of the window for tPA and is already on Eliquis.  Do not feel he meets LVO criteria.  Stroke work-up including teleneurology assessment ordered.  Patient handed off to oncoming physician for full evaluation and work-up. ?  ?Merryl Hacker, MD ?01/23/22 [phone] ? ?

## 2022-01-23 NOTE — ED Notes (Signed)
RN assisted patient to stand and use the urinal. He is very unsteady on his feet and is unable to hold himself up straight. Patient assisted with coordination of urinal and standing. He has deficits of both the left arm and leg. During Stroke swallow screen he was unable to hold the cup without spilling the water in the cup. He stopped drinking after about 2oz and then the cup tilted over.  ?

## 2022-01-23 NOTE — Assessment & Plan Note (Addendum)
Follows with medical oncology, Dr. Truett Perna.  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  CT chest/abdomen/pelvis 3/15 with new and enlarging hepatic metastatic lesions, masslike appearance along gallbladder/porta hepatis, right upper lobe nodule increased in size concerning for progression of his metastatic disease.  Medical oncology plans outpatient follow-up consider salvage systemic chemotherapy.

## 2022-01-23 NOTE — Assessment & Plan Note (Signed)
-   On home Toujeo 25 units daily.  Start with 20 units of Semglee and sensitive sliding scale insulin

## 2022-01-23 NOTE — Consult Note (Signed)
[facility] ?[facility] Tele[facility]urology Consult [facility] ? ?Stat Consult ? ?Patient Name:   Robert Warner, Robert Warner ?Date of Birth:   Apr 19, 1941 ?Identification Number:   MRN - [id number] ?Date of Service:   01/23/2022 07:13:00 ? ?Diagnosis: ?      I63.9 - Cerebrovascular accident (CVA), unspecified mechanism ([place]) ? ?Impression ?81 yo with hx of Diabetes mellitus, hyperlipidemia, History of multiple recent strokes occurring in November and December 2022 in January 2023 (No significant reported residual deficits), history of Cancer of unknown origin With metastatic disease to liver (Receiving chemotherapy), History of recent identified PFO with right to left shunt with associated DVT in November 2022, history of Klebsiella bacteremia In January 2023 who now presents with symptoms upon awakening at 5 am including balance issues, felt weaker in left arm and left leg . He denies falls or injuries. He reports taking his medication compliantly.Currently on examination, patient has mild left arm and leg drift, NIHSS = 2. CT head reveals multiple old strokes involving bilateral basal ganglia and bilateral cerebellar hemispheres and Right thalamus. ?Admit for further cerebrovascular work-up and management ?  ? ?Our recommendations are outlined below. ? ?Diagnostic Studies: ?MRI head with and without contrast ?Please order ?CTA head and neck with contrast ?Please order ? ?Laboratory Studies: ?Recommend Lipid panel ?Hemoglobin A1c ? ?Antithrombotic Medications: ?Aspirin 81 mg PO daily ? ?Additional Diagnostic Studies: ?Consider additional work-up including ruling out infectious embolic cause given his prior history of bacteremia in January although denies infectious symptoms currently. Suggest TEE, telemetry monitoring, consider hypercoagulable work-up (although patient does have underlying adenocarcinoma of unclear origin which may be considered a potential hypercoagulable source) ? ?Additional Antithrombotic  Medications: ?Continue Eliquis for now ? ?Nursing Recommendations: ?Telemetry, IV Fluids, avoid dextrose containing fluids, Maintain euglycemia ?Neuro checks q4 hrs x 24 hrs and then per shift ?Head of bed 30 degrees ? ?Consultations: ?Recommend Speech therapy if failed dysphagia screen ?Physical therapy/Occupational therapy ? ?DVT Prophylaxis: ?Choice of Primary Team ? ?Disposition: ?Neurology will follow ? ? ? ?---------------------------------------------------------------------------------------------------- ?Advanced Imaging: ?Advanced Imaging Not Completed because: ? ?  ? ? ? ?Metrics: ?[facility] Notification Time: 01/23/2022 07:11:29 ?Stamp Time: 01/23/2022 07:13:00 ?Callback Response Time: 01/23/2022 07:13:21 ? ? ?CT HEAD: ?As Per Radiologist CT Head Showed No Acute Hemorrhage or Acute Core Infarct ?Reviewed ?No definite acute abnormalities. Chronic ischemic changes and atrophy noted. Previously noted, remote lacunar infarcts in the bilateral basal ganglia, right thalamus, bilateral cerebellar hemispheres ? ? ? ?---------------------------------------------------------------------------------------------------- ? ?Chief Complaint: ?Balance issues, left arm leg ? ?History of Present Illness: ?Patient is a 81 year old Male. ?81 yo with hx of Diabetes mellitus, hyperlipidemia, History of multiple recent strokes occurring in November and December 2022 in January 2023 (No significant reported residual deficits), history of Cancer of unknown origin With metastatic disease to liver (Receiving chemotherapy), History of recent identified PFO with right to left shunt with associated DVT in November 2022, history of Klebsiella bacteremia In January 2023 who now presents with symptoms upon awakening at 5 am including balance issues, felt weaker in left arm and left leg . He denies falls or injuries. He reports taking his medication compliantly. ?  ? ?Past Medical History: ?     Diabetes Mellitus ?      Hyperlipidemia ?     Stroke ?     Covid-19 ?     There is no history of Hypertension ?     There is no history of Atrial Fibrillation ?     There is no history of  Coronary Artery Disease ?     There is no history of Seizures ?     There is no history of Migraine Headaches ?Other PMH:  Cancer unknown origin (chemo ? ?Medications: ? ?Anticoagulant use:  Yes Eliquis ?No Antiplatelet use ?Reviewed EMR for current medications ?Other Medications Pertinent To Assessment Include: insulin, metformin, Tramadol,Miralax, Atorvastatin . Travatan ? ?Allergies:  ?Reviewed,NKDA ? ?Social History: ?Patient Is: Married ?Smoking: Former ?Alcohol Use: No ?Drug Use: No ? ?Family History: ? ?There Is Family History Of: Stroke- Father, sister ?There is no family history of premature cerebrovascular disease pertinent to this consultation ? ?ROS : ?14 Points Review of Systems was performed and was negative except mentioned in HPI. ? ?Past Surgical History: ?There Is No Surgical History Contributory To Today?s Visit ?There Is Surgical History of:  Appendectomy ? Liver biopsy ? ?  ?Examination: ?BP(151/83), Pulse(84), Blood Glucose(115) ?1A: Level of Consciousness - Alert; keenly responsive + 0 ?1B: Ask Month and Age - Both Questions Right + 0 ?1C: Blink Eyes & Squeeze Hands - Performs Both Tasks + 0 ?2: Test Horizontal Extraocular Movements - Normal + 0 ?3: Test Visual Fields - No Visual Loss + 0 ?4: Test Facial Palsy (Use Grimace if Obtunded) - Normal symmetry + 0 ?5A: Test Left Arm Motor Drift - Drift, but doesn't hit bed + 1 ?5B: Test Right Arm Motor Drift - No Drift for 10 Seconds + 0 ?6A: Test Left Leg Motor Drift - Drift, but doesn't hit bed + 1 ?6B: Test Right Leg Motor Drift - No Drift for 5 Seconds + 0 ?7: Test Limb Ataxia (FNF/Heel-Shin) - No Ataxia + 0 ?8: Test Sensation - Normal; No sensory loss + 0 ?9: Test Language/Aphasia - Normal; No aphasia + 0 ?10: Test Dysarthria - Normal + 0 ?11: Test Extinction/Inattention - No  abnormality + 0 ? ?NIHSS Score: 2 ? ? ? ? ?Patient / Family was informed the Neurology Consult would occur via TeleHealth consult by way of interactive audio and video telecommunications and consented to receiving care in this manner. ? ?Patient is being evaluated for possible acute neurologic impairment and high probability of imminent or life - threatening deterioration.I spent total of 35 minutes providing care to this patient, including time for face to face visit via telemedicine, review of medical records, imaging studies and discussion of findings with providers, the patient and / or family. ? ? ?Dr Daria Pastures ? ? ?[facility] ?[phone] ? ?Case [id number] ?

## 2022-01-23 NOTE — ED Notes (Signed)
Patient updated that there is no bed at this time for admission and we are waiting. Asked if there is anything I can do to make them more comfortable. Patient given ensure and patient's daughter given snacks ?

## 2022-01-23 NOTE — Assessment & Plan Note (Deleted)
-   Continue Eliquis 

## 2022-01-23 NOTE — Assessment & Plan Note (Signed)
-   Continue home meds °

## 2022-01-23 NOTE — ED Notes (Signed)
Patient currently speaking with tele-neurologist Dr. Elvin So ?

## 2022-01-23 NOTE — ED Notes (Signed)
Patient is getting more agitated at the increase in wait time he has been here. Family and patient assured that transport is on the way and report has been called.  ?

## 2022-01-23 NOTE — H&P (Signed)
History and Physical    PatientBRENTON Warner MIW:[id number] DOB: [birth year] DOA: 01/23/2022 DOS: the patient was seen and examined on 01/23/2022 PCP: [name], MD  Patient coming from: [place] ED  Chief Complaint:  Chief Complaint  Patient presents with   Weakness   HPI: Robert Warner is a 81 y.o. male with medical history significant of multiple CVA,intra-atrial shunt, metastatic adenocarcinoma of liver of unknown primary on chemotherapy, left peroneal vein DVT 11/2021 presents with unsteady gait and left sided weakness.  Woke up from bed at 5am this morning to use bathroom but felt unsteady with left sided upper and lower extremity weakness. Use cane at baseline. Denies any headache, blurred vision, slurred speech or facial drop.   He had an embolic stroke in 25/0037 with similar symptoms with echocardiogram showing intra-atrium right to left shunt. Reportedly on plavix and statin for secondary prevention but had breakthrough stroke in 11/2021. Given history of left to right shunt lower extremity venous duplex was ordered which was positive for acute DVT. He was treated with IV heparin and ultimately placed on Eliquis. He was also incidentally found during that hospitalization to have Klebsiella bacteremia on 12/05/2020 and treated initially on IV Rocephin and narrowed to IV cefazolin and then to cefadroxil for a total of 2 weeks. Had outpatient follow up with ID on 01/03/2022 and was released due to no signs of infection.   In addition to his left-sided weakness today, he also notes dysuria for the past several days but denies any abdominal pain, nausea or vomiting.  No fever.  Denies any chest pain or palpitations.  In the ED, he was afebrile, normotensive on room air.  No leukocytosis.  Hemoglobin of 11.9.  BMP otherwise normal except for mildly elevated creatinine of 1.22 from prior of 1.04.  BG of 123.  UDS is negative.  UA shows large leukocyte, positive  nitrite and many bacteria.  CT head was negative for any acute findings and showed only old remote infarcts.  He was evaluated by teleneurology who recommended further imaging and possible TEE for further work-up.  Review of Systems: As mentioned in the history of present illness. All other systems reviewed and are negative. Past Medical History:  Diagnosis Date   Arthritis    Cancer ([place])    Cataract    Diabetes mellitus    Hyperlipidemia    Stroke [facility])    Past Surgical History:  Procedure Laterality Date   APPENDECTOMY  1968   CATARACT EXTRACTION Bilateral 07/2000   states 2 weeks ago (first of sept) OD due in Dec    COLONOSCOPY  2004,2009   Negative, Dr. Sharlett Iles    IR IMAGING GUIDED PORT INSERTION  10/14/2021   KIDNEY STONE SURGERY  10/2018   PROSTATE BIOPSY  2006   Dr.Sigmund Tannebaum   Social History:  reports that he quit smoking about 55 years ago. His smoking use included cigarettes. He has a 5.00 pack-year smoking history. He has never used smokeless tobacco. He reports that he does not currently use alcohol after a past usage of about 2.0 standard drinks per week. He reports that he does not use drugs.  No Known Allergies  Family History  Problem Relation Age of Onset   Hypertension Mother    Coronary artery disease Mother    Stroke Father 82   Stroke Sister    Pancreatic cancer Brother    Diabetes Maternal Aunt    Coronary artery disease Maternal Uncle  2 Maternal Uncles    Diabetes Maternal Uncle    Breast cancer Paternal Aunt    Coronary artery disease Paternal Aunt    Coronary artery disease Maternal Grandmother    Stroke Paternal Grandmother    Heart disease Neg Hx    Colon cancer Neg Hx    Esophageal cancer Neg Hx    Rectal cancer Neg Hx    Stomach cancer Neg Hx     Prior to Admission medications   Medication Sig Start Date End Date Taking? Authorizing Provider  acetaminophen (TYLENOL) 650 MG CR tablet Take 650 mg by mouth every 8 (eight)  hours as needed for pain.   Yes [provider]  apixaban (ELIQUIS) 5 MG TABS tablet Take 1 tablet (5 mg total) by mouth 2 (two) times daily. 12/09/21  Yes Elgergawy, [name], MD  atorvastatin (LIPITOR) 40 MG tablet Take 1 tablet (40 mg total) by mouth daily. 10/19/21 01/23/22 Yes Burchette, Alinda Sierras, MD  Cholecalciferol (VITAMIN D3) 2000 UNITS TABS Take 2,000 Units by mouth daily.   Yes [provider]  insulin aspart (NOVOLOG) 100 UNIT/ML injection Inject 0-15 Units into the skin 3 (three) times daily with meals. Patient taking differently: Inject 0-15 Units into the skin daily as needed for high blood sugar. Take only if sugar is over 200 12/09/21  Yes Elgergawy, [name], MD  insulin glargine, 2 Unit Dial, (TOUJEO MAX SOLOSTAR) 300 UNIT/ML Solostar Pen Inject 25 Units into the skin daily. 12/09/21  Yes Elgergawy, [name], MD  lidocaine-prilocaine (EMLA) cream Apply 1 application topically as needed. Apply 1/2 tablespoon to port site 2 hours prior to stick and cover with Press-and-Seal to numb site for access Patient taking differently: Apply 1 application. topically daily as needed. Apply 1/2 tablespoon to port site 2 hours prior to stick and cover with Press-and-Seal to numb site for access 10/17/21  Yes [name], MD  metFORMIN (GLUCOPHAGE-XR) 750 MG 24 hr tablet TAKE 1 TABLET BY MOUTH EVERY DAY WITH BREAKFAST Patient taking differently: Take 750 mg by mouth daily with breakfast. 11/21/21  Yes Burchette, Alinda Sierras, MD  ondansetron (ZOFRAN) 8 MG tablet Take 1 tablet (8 mg total) by mouth every 8 (eight) hours as needed for nausea or vomiting. Start 72 hours after each IV chemotherapy administration 10/17/21  Yes [name], MD  pantoprazole (PROTONIX) 20 MG tablet TAKE 1 TABLET(20 MG) BY MOUTH DAILY Patient taking differently: Take 20 mg by mouth daily. 09/30/21  Yes [name], NP  polyethylene glycol (MIRALAX / GLYCOLAX) 17 g packet Take 34 g by mouth daily as  needed. Patient taking differently: Take 17 g by mouth daily as needed for mild constipation. 12/09/21  Yes Elgergawy, [name], MD  prochlorperazine (COMPAZINE) 10 MG tablet Take 1 tablet (10 mg total) by mouth every 6 (six) hours as needed for nausea. 10/17/21  Yes [name], MD  silodosin (RAPAFLO) 8 MG CAPS capsule Take 8 mg by mouth at bedtime.   Yes [provider]  traMADol (ULTRAM) 50 MG tablet Take 1 tablet (50 mg total) by mouth every 6 (six) hours as needed for moderate pain. 12/09/21  Yes Elgergawy, [name], MD  TRAVATAN Z 0.004 % SOLN ophthalmic solution Place 1 drop into both eyes at bedtime. 11/12/16  Yes [provider]  Accu-Chek Softclix Lancets lancets Use to test blood sugar levels once a day. 11/22/21   Burchette, Alinda Sierras, MD  Blood Glucose Monitoring Suppl (ACCU-CHEK AVIVA  PLUS) w/Device KIT Use to check blood sugar once a day 11/21/21   Burchette, Alinda Sierras, MD  Continuous Blood Gluc Receiver (DEXCOM G6 RECEIVER) DEVI Use to check blood sugar levels as directed. 12/02/21   Burchette, Alinda Sierras, MD  Continuous Blood Gluc Sensor (DEXCOM G6 SENSOR) MISC Use to check blood glucose levels as directed. 12/02/21   Burchette, Alinda Sierras, MD  glucose blood test strip Use as instructed 11/21/21   Burchette, Alinda Sierras, MD  senna-docusate (SENOKOT-S) 8.6-50 MG tablet Take 2 tablets by mouth 2 (two) times daily. Hold for diarrhea Patient not taking: Reported on 01/23/2022 12/09/21   Elgergawy, [name], MD    Physical Exam: Vitals:   01/23/22 1253 01/23/22 1545 01/23/22 1735 01/23/22 1943  BP: (!) 151/94 (!) 148/95 (!) 144/87 (!) 144/92  Pulse: 84 78 81 75  Resp: 14 15 20 15   Temp:   98.4 F (36.9 C) (!) 97.3 F (36.3 C)  TempSrc:   Oral Oral  SpO2: 99% 99% 100% 100%  Weight:      Height:       Constitutional: NAD, calm, comfortable, well-appearing elderly male who appears younger than stated age laying at approximately 30 degree incline. Eyes: lids and conjunctivae  normal ENMT: Mucous membranes are moist.  Neck: normal, supple Respiratory: clear to auscultation bilaterally, no wheezing, no crackles. Normal respiratory effort. No accessory muscle use.  Cardiovascular: Regular rate and rhythm, no murmurs / rubs / gallops. No extremity edema.  Abdomen: no tenderness,  Bowel sounds positive.  Musculoskeletal: no clubbing / cyanosis. No joint deformity upper and lower extremities. Good ROM, no contractures. Normal muscle tone.  Skin: no rashes, lesions, ulcers. Neurologic: CN 2-12 grossly intact.  No facial asymmetry, symmetric shoulder shrug, equal smile, intact finger-nose.  Left upper extremity drift on extension.  Equal hand grasp.  Sensation intact, strength of 4-5 to left lower extremity. Psychiatric: Normal judgment and insight. Alert and oriented x 3. Normal mood. Data Reviewed:  See HPI  Assessment and Plan: Left-sided weakness Left-sided weakness -History of embolic stroke in November and then again in January of this year.  Has left to right intra atrium shunt seen on echocardiogram in November.  Also found to have acute DVT and started on Eliquis in January.  Denies any missed doses. -Obtain MRI brain -CTA head and neck -Obtain lipid panel, hemoglobin A1c -TEE suggested given hx of bacteremia in January but pt currently does not appear septic althought he does have UTI today. Will awaiting blood culture-obtained after a dose of IV Rocehin since cultures was not ordered in ED  -start aspirin and continue Elquis -continue statin  -PT/OT -Frequent neuro checks and keep on telemetry -Allow for permissive hypertension with blood pressure treatment as needed only if systolic goes above 161  DVT in Left peroneal vein - Continue Eliquis  UTI (urinary tract infection) Continue IV Rocephin.  Urine culture pending  Cancer with unknown primary site [facility]) Poorly differentiated adenocarcinoma to the liver status post chemo.  Has planned restaging  evaluation in 3 weeks to decide on resuming systemic therapy. Follows with oncology [name]  BPH (benign prostatic hyperplasia) - Continue home meds  Type 2 diabetes mellitus with hyperglycemia ([place]) - On home Toujeo 25 units daily.  Start with 20 units of Semglee and sensitive sliding scale insulin      Advance Care Planning:   Code Status: Full Code   Consults: Neurology  Family Communication: Daughter at bedside  Severity of Illness: The  appropriate patient status for this patient is OBSERVATION. Observation status is judged to be reasonable and necessary in order to provide the required intensity of service to ensure the patient's safety. The patient's presenting symptoms, physical exam findings, and initial radiographic and laboratory data in the context of their medical condition is felt to place them at decreased risk for further clinical deterioration. Furthermore, it is anticipated that the patient will be medically stable for discharge from the hospital within 2 midnights of admission.   Author: Orene Desanctis, DO 01/23/2022 8:01 PM  For on call review [email or web address].

## 2022-01-23 NOTE — Assessment & Plan Note (Signed)
Continue IV Rocephin.  Urine culture pending

## 2022-01-23 NOTE — ED Notes (Signed)
[facility] at bedside to take patient to [facility] ?

## 2022-01-23 NOTE — Progress Notes (Addendum)
Patient arrived to unit via [facility]. Pt ambulated to bed with assistance. Patient A/Ox 4, VSS, bed left in lowest position.  ? ?Gwendolyn Grant, RN  ?

## 2022-01-23 NOTE — Assessment & Plan Note (Signed)
Left-sided weakness ?-History of embolic stroke in November and then again in January of this year.  Has left to right intra atrium shunt seen on echocardiogram in November.  Also found to have acute DVT and started on Eliquis in January.  Denies any missed doses. ?-Obtain MRI brain ?-CTA head and neck ?-Obtain lipid panel, hemoglobin A1c ?-TEE suggested given hx of bacteremia in January but pt currently does not appear septic althought he does have UTI today. Will awaiting blood culture-obtained after a dose of IV Rocehin since cultures was not ordered in ED  ?-start aspirin and continue Elquis ?-continue statin  ?-PT/OT ?-Frequent neuro checks and keep on telemetry ?-Allow for permissive hypertension with blood pressure treatment as needed only if systolic goes above 383 ?

## 2022-01-24 ENCOUNTER — Other Ambulatory Visit (HOSPITAL_COMMUNITY): Payer: Self-pay

## 2022-01-24 ENCOUNTER — Encounter: Payer: Self-pay | Admitting: Oncology

## 2022-01-24 ENCOUNTER — Encounter: Payer: Self-pay | Admitting: *Deleted

## 2022-01-24 ENCOUNTER — Observation Stay (HOSPITAL_COMMUNITY): Payer: Medicare HMO

## 2022-01-24 DIAGNOSIS — N400 Enlarged prostate without lower urinary tract symptoms: Secondary | ICD-10-CM | POA: Diagnosis present

## 2022-01-24 DIAGNOSIS — I6340 Cerebral infarction due to embolism of unspecified cerebral artery: Secondary | ICD-10-CM | POA: Diagnosis not present

## 2022-01-24 DIAGNOSIS — A499 Bacterial infection, unspecified: Secondary | ICD-10-CM | POA: Diagnosis not present

## 2022-01-24 DIAGNOSIS — N3000 Acute cystitis without hematuria: Secondary | ICD-10-CM | POA: Diagnosis not present

## 2022-01-24 DIAGNOSIS — I639 Cerebral infarction, unspecified: Secondary | ICD-10-CM | POA: Diagnosis not present

## 2022-01-24 DIAGNOSIS — Z7984 Long term (current) use of oral hypoglycemic drugs: Secondary | ICD-10-CM | POA: Diagnosis not present

## 2022-01-24 DIAGNOSIS — R29898 Other symptoms and signs involving the musculoskeletal system: Secondary | ICD-10-CM | POA: Diagnosis present

## 2022-01-24 DIAGNOSIS — Z6826 Body mass index (BMI) 26.0-26.9, adult: Secondary | ICD-10-CM | POA: Diagnosis not present

## 2022-01-24 DIAGNOSIS — E785 Hyperlipidemia, unspecified: Secondary | ICD-10-CM | POA: Diagnosis present

## 2022-01-24 DIAGNOSIS — Q248 Other specified congenital malformations of heart: Secondary | ICD-10-CM | POA: Diagnosis not present

## 2022-01-24 DIAGNOSIS — E1169 Type 2 diabetes mellitus with other specified complication: Secondary | ICD-10-CM | POA: Diagnosis not present

## 2022-01-24 DIAGNOSIS — Z86718 Personal history of other venous thrombosis and embolism: Secondary | ICD-10-CM | POA: Diagnosis not present

## 2022-01-24 DIAGNOSIS — Z20822 Contact with and (suspected) exposure to covid-19: Secondary | ICD-10-CM | POA: Diagnosis present

## 2022-01-24 DIAGNOSIS — C787 Secondary malignant neoplasm of liver and intrahepatic bile duct: Secondary | ICD-10-CM | POA: Diagnosis present

## 2022-01-24 DIAGNOSIS — E1165 Type 2 diabetes mellitus with hyperglycemia: Secondary | ICD-10-CM | POA: Diagnosis present

## 2022-01-24 DIAGNOSIS — I82552 Chronic embolism and thrombosis of left peroneal vein: Secondary | ICD-10-CM | POA: Diagnosis present

## 2022-01-24 DIAGNOSIS — Z0389 Encounter for observation for other suspected diseases and conditions ruled out: Secondary | ICD-10-CM | POA: Diagnosis not present

## 2022-01-24 DIAGNOSIS — E663 Overweight: Secondary | ICD-10-CM | POA: Diagnosis present

## 2022-01-24 DIAGNOSIS — Z794 Long term (current) use of insulin: Secondary | ICD-10-CM | POA: Diagnosis not present

## 2022-01-24 DIAGNOSIS — Z7901 Long term (current) use of anticoagulants: Secondary | ICD-10-CM | POA: Diagnosis not present

## 2022-01-24 DIAGNOSIS — D6959 Other secondary thrombocytopenia: Secondary | ICD-10-CM | POA: Diagnosis present

## 2022-01-24 DIAGNOSIS — Z87442 Personal history of urinary calculi: Secondary | ICD-10-CM | POA: Diagnosis not present

## 2022-01-24 DIAGNOSIS — Z8673 Personal history of transient ischemic attack (TIA), and cerebral infarction without residual deficits: Secondary | ICD-10-CM | POA: Diagnosis not present

## 2022-01-24 DIAGNOSIS — H9193 Unspecified hearing loss, bilateral: Secondary | ICD-10-CM | POA: Diagnosis present

## 2022-01-24 DIAGNOSIS — I69312 Visuospatial deficit and spatial neglect following cerebral infarction: Secondary | ICD-10-CM | POA: Diagnosis not present

## 2022-01-24 DIAGNOSIS — Z833 Family history of diabetes mellitus: Secondary | ICD-10-CM | POA: Diagnosis not present

## 2022-01-24 DIAGNOSIS — I63319 Cerebral infarction due to thrombosis of unspecified middle cerebral artery: Secondary | ICD-10-CM | POA: Diagnosis not present

## 2022-01-24 DIAGNOSIS — I69354 Hemiplegia and hemiparesis following cerebral infarction affecting left non-dominant side: Secondary | ICD-10-CM | POA: Diagnosis present

## 2022-01-24 DIAGNOSIS — E1122 Type 2 diabetes mellitus with diabetic chronic kidney disease: Secondary | ICD-10-CM | POA: Diagnosis present

## 2022-01-24 DIAGNOSIS — I63511 Cerebral infarction due to unspecified occlusion or stenosis of right middle cerebral artery: Secondary | ICD-10-CM | POA: Diagnosis not present

## 2022-01-24 DIAGNOSIS — I63442 Cerebral infarction due to embolism of left cerebellar artery: Secondary | ICD-10-CM | POA: Diagnosis present

## 2022-01-24 DIAGNOSIS — Z79899 Other long term (current) drug therapy: Secondary | ICD-10-CM | POA: Diagnosis not present

## 2022-01-24 DIAGNOSIS — G8194 Hemiplegia, unspecified affecting left nondominant side: Secondary | ICD-10-CM | POA: Diagnosis present

## 2022-01-24 DIAGNOSIS — N183 Chronic kidney disease, stage 3 unspecified: Secondary | ICD-10-CM | POA: Diagnosis present

## 2022-01-24 DIAGNOSIS — I129 Hypertensive chronic kidney disease with stage 1 through stage 4 chronic kidney disease, or unspecified chronic kidney disease: Secondary | ICD-10-CM | POA: Diagnosis present

## 2022-01-24 DIAGNOSIS — Z79891 Long term (current) use of opiate analgesic: Secondary | ICD-10-CM | POA: Diagnosis not present

## 2022-01-24 DIAGNOSIS — R29818 Other symptoms and signs involving the nervous system: Secondary | ICD-10-CM | POA: Diagnosis not present

## 2022-01-24 DIAGNOSIS — N390 Urinary tract infection, site not specified: Secondary | ICD-10-CM | POA: Diagnosis present

## 2022-01-24 DIAGNOSIS — E119 Type 2 diabetes mellitus without complications: Secondary | ICD-10-CM | POA: Diagnosis present

## 2022-01-24 DIAGNOSIS — D6859 Other primary thrombophilia: Secondary | ICD-10-CM | POA: Diagnosis present

## 2022-01-24 DIAGNOSIS — Z713 Dietary counseling and surveillance: Secondary | ICD-10-CM | POA: Diagnosis not present

## 2022-01-24 DIAGNOSIS — Z823 Family history of stroke: Secondary | ICD-10-CM | POA: Diagnosis not present

## 2022-01-24 DIAGNOSIS — I63431 Cerebral infarction due to embolism of right posterior cerebral artery: Secondary | ICD-10-CM | POA: Diagnosis present

## 2022-01-24 DIAGNOSIS — T451X5A Adverse effect of antineoplastic and immunosuppressive drugs, initial encounter: Secondary | ICD-10-CM | POA: Diagnosis present

## 2022-01-24 DIAGNOSIS — D6869 Other thrombophilia: Secondary | ICD-10-CM | POA: Diagnosis present

## 2022-01-24 DIAGNOSIS — C801 Malignant (primary) neoplasm, unspecified: Secondary | ICD-10-CM | POA: Diagnosis present

## 2022-01-24 DIAGNOSIS — Z8249 Family history of ischemic heart disease and other diseases of the circulatory system: Secondary | ICD-10-CM | POA: Diagnosis not present

## 2022-01-24 DIAGNOSIS — Z87891 Personal history of nicotine dependence: Secondary | ICD-10-CM | POA: Diagnosis not present

## 2022-01-24 DIAGNOSIS — E669 Obesity, unspecified: Secondary | ICD-10-CM | POA: Diagnosis not present

## 2022-01-24 DIAGNOSIS — M1990 Unspecified osteoarthritis, unspecified site: Secondary | ICD-10-CM | POA: Diagnosis present

## 2022-01-24 DIAGNOSIS — R29702 NIHSS score 2: Secondary | ICD-10-CM | POA: Diagnosis present

## 2022-01-24 DIAGNOSIS — I63131 Cerebral infarction due to embolism of right carotid artery: Secondary | ICD-10-CM | POA: Diagnosis not present

## 2022-01-24 DIAGNOSIS — I63411 Cerebral infarction due to embolism of right middle cerebral artery: Secondary | ICD-10-CM | POA: Diagnosis not present

## 2022-01-24 LAB — GLUCOSE, CAPILLARY
Glucose-Capillary: 77 mg/dL (ref 70–99)
Glucose-Capillary: 168 mg/dL — ABNORMAL HIGH (ref 70–99)
Glucose-Capillary: 77 mg/dL (ref 70–99)
Glucose-Capillary: 64 mg/dL — ABNORMAL LOW (ref 70–99)

## 2022-01-24 LAB — LIPID PANEL
Cholesterol: 158 mg/dL (ref 0–200)
HDL: 64 mg/dL (ref >40)
LDL Cholesterol: 85 mg/dL (ref 0–99)
Total CHOL/HDL Ratio: 2.5 RATIO
Triglycerides: 47 mg/dL (ref <150)
VLDL: 9 mg/dL (ref 0–40)

## 2022-01-24 LAB — HEMOGLOBIN A1C
Hgb A1c MFr Bld: 8 % — ABNORMAL HIGH (ref 4.8–5.6)
Mean Plasma Glucose: 183 mg/dL

## 2022-01-24 MED ORDER — ENOXAPARIN SODIUM 80 MG/0.8ML IJ SOSY
80.0000 mg | PREFILLED_SYRINGE | Freq: Two times a day (BID) | INTRAMUSCULAR | Status: DC
Start: 2022-01-24 — End: 2022-01-27
  Administered 2022-01-24 – 2022-01-26 (×5): 80 mg via SUBCUTANEOUS
  Filled 2022-01-24 (×5): qty 0.8

## 2022-01-24 MED ORDER — GADOBUTROL 1 MMOL/ML IV SOLN
8.0000 mL | Freq: Once | INTRAVENOUS | Status: AC | PRN
  Administered 2022-01-24: 8 mL via INTRAVENOUS

## 2022-01-24 NOTE — Progress Notes (Signed)
ANTICOAGULATION CONSULT NOTE - Follow Up Consult ? ?Pharmacy Consult for Lovenox ?Indication: DVT / new CVA with Apixaban ? ?No Known Allergies ? ?Patient Measurements: ?Height: '5\' 10"'$  (177.8 cm) ?Weight: 83 kg (182 lb 15.7 oz) ?IBW/kg (Calculated) : 73 ? ? ?Vital Signs: ?Temp: 98.6 ?F (37 ?C) (03/14 1206) ?Temp Source: Oral (03/14 1206) ?BP: 120/77 (03/14 1206) ?Pulse Rate: 81 (03/14 1206) ? ?Labs: ?Recent Labs  ?  01/23/22 ?0700  ?HGB 11.9*  ?HCT 37.1*  ?PLT 155  ?APTT 28  ?LABPROT 13.6  ?INR 1.0  ?CREATININE 1.22  ? ? ?Estimated Creatinine Clearance: 48.2 mL/min (by C-G formula based on SCr of 1.22 mg/dL). ? ?Assessment: ?On apixaban prior to admission for DVT history, also with liver cancer.  Switching to Lovenox with new CVA diagnosis  ? ?Goal of Therapy:  ?Anti-Xa level 0.6-1 units/ml 4hrs after LMWH dose given ?Monitor platelets by anticoagulation protocol: Yes ?  ?Plan:  ?Lovenox 80 mg sq Q 12 hours to start at 10 pm tonight ?Follow up CBC ? ?Thank you ?Anette Guarneri, PharmD ? ? ?01/24/2022,1:35 PM ? ? ?

## 2022-01-24 NOTE — Assessment & Plan Note (Signed)
TTE 09/17/2021 notable for positive shunting with agitated saline suggestive of interatrial shunt.  Transcranial Doppler 09/20/2021 positive for medium size right to left shunt. ?

## 2022-01-24 NOTE — Progress Notes (Signed)
?PROGRESS NOTE ? ? ? ?Robert Warner  HYW:[id number] DOB: 1940-12-14 DOA: 01/23/2022 ?PCP: [name], MD  ? ? ?Brief Narrative:  ?Robert Warner is a 81 year old male with past medical history significant for multiple CVAs, intra-atrial shunt, metastatic adenocarcinoma of the liver of unknown primary on chemotherapy, left peroneal vein DVT January 2023 DM2, BPH who initially presented to [place] on 3/13 with unsteady gait and left-sided weakness.  Patient states woke up from bed at 5 AM attempting to use the bathroom but felt unsteady with ambulation with both right upper/lower extremity weakness.  Patient utilizes a cane at baseline.  Patient also reports dysuria for the last several days.  Denies headache, no blurred vision, no slurred speech or facial droop.  Further denies abdominal pain, no nausea/vomiting, no fever, no chest pain or palpitations. ? ?Patient with history of embolic stroke November 4854 with similar symptoms.  Echocardiogram showing intra atrium right to left shunt.  Was on Plavix and a statin for secondary prevention but had a breakthrough stroke January 2023. Given history of left to right shunt lower extremity venous duplex was ordered which was positive for acute DVT. He was treated with IV heparin and ultimately placed on Eliquis. He was also incidentally found during that hospitalization to have Klebsiella bacteremia on 12/05/2020 and treated initially on IV Rocephin and narrowed to IV cefazolin and then to cefadroxil for a total of 2 weeks. Had outpatient follow up with ID on 01/03/2022 and was released due to no signs of infection.  ? ?In the ED, temperature 98.4 ?F, HR 84, RR 16, BP 151/83, SPO2 97% on room air.  WBC 4.7, hemoglobin 11.9, platelets 155.  Sodium 137, potassium 4.4, chloride 102, CO2 28, glucose 123, BUN 24, creatinine 1.22.  AST 38, ALT 33, total bilirubin 0.8.  COVID-19 PCR negative.  Influenza A/B PCR negative.  EtOH level less than 10.  Urinalysis  with large leukocytes, positive nitrite, many bacteria, greater than 50 WBCs.  CT head without contrast with no acute intracranial abnormalities, chronic small vessel ischemic disease and brain atrophy, remote lacunar infarcts within the bilateral basal ganglia, right thalamus and bilateral cerebral hemispheres.  CT angiogram head/neck with no intracranial large vessel occlusion or significant stenosis, notable interval increase right upper lobe nodule measuring 12 mm, previously 9 mm.  MR brain with and without contrast with multiple mostly punctate acute infarcts in the right greater than left cerebral hemispheres and left cerebellar hemisphere with larger area of infarction in the posterior right frontal lobe suspicion for embolic etiology, area of enhancement left cerebral hemisphere favored to represent a subacute infarct.  Patient was assessed by teleneurology with recommendations of transfer to [facility] for continued work-up and neurology evaluation.   ? ?  ? ? ?Assessment & Plan: ?  ?Assessment and Plan: ?* Acute embolic stroke ([place]) ?Patient presenting to ED with gait abnormality, left-sided weakness.  History of embolic CVA 62/70 with recurrence 1/23.  History of left to right interatrial shunt noted on TTE 11/22.  Currently on aspirin and Eliquis, denies missed doses. MR brain with and without contrast with multiple mostly punctate acute infarcts in the right greater than left cerebral hemispheres and left cerebellar hemisphere with larger area of infarction in the posterior right frontal lobe suspicion for embolic etiology, area of enhancement left cerebral hemisphere favored to represent a subacute infarct.  CTA head/neck with no large vessel occlusion or significant cranial stenosis.  TTE 1/23 with LVEF 50-55%, no  regional wall motion normalities, grade 1 diastolic dysfunction, aortic dilation 39 mm.  LDL 85 ?--Neurology following, appreciate assistance ?--Hemoglobin A1c: Pending ?--PT/OT  recommended CIR; rehab MD consulted for evaluation ?--SLP: Pending ?--Eliquis 5 mg p.o. twice daily ?--Aspirin 81 mg p.o. daily ?--Atorvastatin 40 mg p.o. daily ?--Await further neurology recommendations ? ? ? ?UTI (urinary tract infection) ?Urinalysis with large leukocytes, positive nitrite, many bacteria, greater than 50 WBCs.  Patient complaining of dysuria. ?--Urine culture: Pending ?--Ceftriaxone 1 g IV every 24 hours ? ?DVT in Left peroneal vein ?Vascular duplex ultrasound 12/06/2021 with age-indeterminate DVT left peroneal veins. ?--Eliquis 5 mg p.o. twice daily ? ?Type 2 diabetes mellitus with hyperglycemia (H[facility]) ?Home regimen includes Toujeo 25 units daily and metformin XR 750 mg p.o. daily.  ?--Hemoglobin A1c: Pending ?--Holding oral hypoglycemics while inpatient ?--Semglee 20u [place] daily ?--sensitive SSI for coverage ?--CBGs qAC/HS ? ?Cancer with unknown primary site [facility]) ?Follows with medical oncology, [name].  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  Has planned restaging evaluation in 3 weeks to decide on resuming systemic therapy. ? ?Interatrial cardiac shunt ?TTE 09/17/2021 notable for positive shunting with agitated saline suggestive of interatrial shunt.  Transcranial Doppler 09/20/2021 positive for medium size right to left shunt. ? ?BPH (benign prostatic hyperplasia) ?--Tamsulosin 0.4 mg p.o. daily (substituted for Rapaflo) ? ? ? ? ?DVT prophylaxis:  ?apixaban (ELIQUIS) tablet 5 mg  ?  Code Status: Full Code ?Family Communication: No family present at bedside this morning ? ?Disposition Plan:  ?Level of care: Telemetry Medical ?Status is: Inpatient ?Remains inpatient appropriate because: Pending further neurology recommendations, awaiting rehab MD evaluation for CIR assessment ?  ? ?Consultants:  ?Neurology ? ?Procedures:  ?None ? ?Antimicrobials:  ?Ceftriaxone 3/13>> ? ? ? ?Subjective: ?Patient seen examined bedside, resting comfortably.  Continues with left-sided  weakness.  States utilizes a cane at baseline.  Was worried that he had a recurrent stroke.  No other specific questions or concerns at this time.  Denies headache, no visual changes, no chest pain, palpitations, no shortness of breath, no abdominal pain, no fatigue, no nausea/vomiting/diarrhea, no fever/chills/night sweats, no paresthesias.  No acute events overnight per nursing staff. ? ?Objective: ?Vitals:  ? 01/24/22 0100 01/24/22 0300 01/24/22 0901 01/24/22 1206  ?BP: (!) 139/98 (!) 139/95 (!) 140/96 120/77  ?Pulse: 87 80 83 81  ?Resp: '20 19 19 20  '$ ?Temp: 98 ?F (36.7 ?C) 98 ?F (36.7 ?C) 98.6 ?F (37 ?C) 98.6 ?F (37 ?C)  ?TempSrc: Oral Oral Oral Oral  ?SpO2: 100% 98% 99% 100%  ?Weight:      ?Height:      ? ? ?Intake/Output Summary (Last 24 hours) at 01/24/2022 1312 ?Last data filed at 01/24/2022 1610 ?Gross per 24 hour  ?Intake 98.22 ml  ?Output 200 ml  ?Net -101.78 ml  ? ?Filed Weights  ? 01/23/22 0654  ?Weight: 83 kg  ? ? ?Examination: ? ?Physical Exam: ?GEN: NAD, alert and oriented x 3, wd/wn ?HEENT: NCAT, PERRL, EOMI, sclera clear, MMM ?PULM: CTAB w/o wheezes/crackles, normal respiratory effort, on room air ?CV: RRR w/o M/G/R ?GI: abd soft, NTND, NABS, no R/G/M ?MSK: no peripheral edema, muscle strength Left UE/LE 4/5, right UE/LE ?NEURO: CN II-XII intact, muscles strength discrepancy as above with no other focal deficits appreciated, sensation to light touch intact ?PSYCH: normal mood/affect ?Integumentary: dry/intact, no rashes or wounds ? ? ? ?Data Reviewed: I have personally reviewed following labs and imaging studies ? ?CBC: ?Recent Labs  ?Lab 01/23/22 ?  0700  ?WBC 4.7  ?NEUTROABS 2.6  ?HGB 11.9*  ?HCT 37.1*  ?MCV 89.0  ?PLT 155  ? ?Basic Metabolic Panel: ?Recent Labs  ?Lab 01/23/22 ?0700  ?NA 137  ?K 4.4  ?CL 102  ?CO2 28  ?GLUCOSE 123*  ?BUN 24*  ?CREATININE 1.22  ?CALCIUM 10.0  ? ?GFR: ?Estimated Creatinine Clearance: 48.2 mL/min (by C-G formula based on SCr of 1.22 mg/dL). ?Liver Function Tests: ?Recent  Labs  ?Lab 01/23/22 ?0700  ?AST 38  ?ALT 33  ?ALKPHOS 130*  ?BILITOT 0.8  ?PROT 7.3  ?ALBUMIN 3.8  ? ?No results for input(s): LIPASE, AMYLASE in the last 168 hours. ?No results for input(s): AMMONIA i

## 2022-01-24 NOTE — Progress Notes (Signed)
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 Will inform Dr Benay Spice ? ?

## 2022-01-24 NOTE — TOC Benefit Eligibility Note (Signed)
Patient Advocate Encounter ? ?Insurance verification completed.   ? ?The patient is currently admitted and upon discharge could be taking enoxaparin (Lovenox) 80 mg/0.8 ml. ? ?The current 30 day co-pay is, $95.00.  ? ?The patient is insured through [employer] Part D  ? ? ? ?Lyndel Safe, CPhT ?Pharmacy Patient Advocate Specialist ?[place] Patient Advocate Team ?Direct Number: [phone]  Fax: [phone] ? ? ? ? ? ?  ?

## 2022-01-24 NOTE — Evaluation (Signed)
Physical Therapy Evaluation Patient Details Name: Robert Warner MRN: [id number] DOB: August 16, 1941 Today's Date: 01/24/2022  History of Present Illness  81 yo male who presented with unsteady gait and left sided weakness. CT head was negative for any acute findings, MRI reveals multiple infarcts in L cereblloar region and L & R cerebral regions. PMH includes multiple CVA,intra-atrial shunt, metastatic adenocarcinoma of liver of unknown primary on chemotherapy, left peroneal vein DVT 11/2021.    Clinical Impression  Patient received in bed, he is agreeable to PT/OT assessment. Patient is mod independent with supine to sit. Close supervision for balance and safety. He required mod +1-2 for sit to stand from edge of bed. Initially ambulated to bathroom with RW and min +2 assist. Difficulty keeping L UE on grip of walker. L LE buckling during gait. Poor balance and poor safety awareness. He has limited attention to left side. Patient then assisted to chair at sink and pivoted to recliner with +2 mod assist. He will continue to benefit from skilled PT while here to improve safety and independence with mobility.         Recommendations for follow up therapy are one component of a multi-disciplinary discharge planning process, led by the attending physician.  Recommendations may be updated based on patient status, additional functional criteria and insurance authorization.  Follow Up Recommendations Acute inpatient rehab (3hours/day)    Assistance Recommended at Discharge Frequent or constant Supervision/Assistance  Patient can return home with the following  A lot of help with walking and/or transfers;A little help with bathing/dressing/bathroom;Assistance with cooking/housework;Assist for transportation;Assistance with feeding;Help with stairs or ramp for entrance    Equipment Recommendations None recommended by PT (TBD)  Recommendations for Other Services  Rehab consult    Functional Status  Assessment Patient has had a recent decline in their functional status and demonstrates the ability to make significant improvements in function in a reasonable and predictable amount of time.     Precautions / Restrictions Precautions Precautions: Fall Precaution Comments: High fall risk, poor safety awareness Restrictions Weight Bearing Restrictions: No      Mobility  Bed Mobility Overal bed mobility: Modified Independent             General bed mobility comments: close supervision due to poor awareness of deficits    Transfers Overall transfer level: Needs assistance Equipment used: Rolling walker (2 wheels) Transfers: Sit to/from Stand Sit to Stand: Mod assist, +2 safety/equipment           General transfer comment: patient required several attemts to get standing due to L side weakness. Poor balance.    Ambulation/Gait Ambulation/Gait assistance: Mod assist, +2 physical assistance, +2 safety/equipment Gait Distance (Feet): 15 Feet Assistive device: Rolling walker (2 wheels), 2 person hand held assist, None Gait Pattern/deviations: Step-to pattern, Decreased step length - right, Decreased step length - left, Ataxic, Staggering left, Staggering right, Narrow base of support Gait velocity: decr     General Gait Details: patient is very unsafe with ambulation. Attempted to use RW, could not keep L hand on walker, required mod +2 assist for safety to get into bathroom. Attempted to walk to sink with hand held assist, patient just holding to walls, counter etc falling to his left and not able to self correct. Required mod to max +2 assist to prevent fall.  Stairs            Wheelchair Mobility    Modified Rankin (Stroke Patients Only)  Balance Overall balance assessment: Needs assistance Sitting-balance support: Feet supported Sitting balance-Leahy Scale: Poor Sitting balance - Comments: leaning to left in sitting, poor awareness Postural control:  Left lateral lean Standing balance support: Single extremity supported, During functional activity, Reliant on assistive device for balance Standing balance-Leahy Scale: Poor Standing balance comment: falling to his left with mobility, L LE giving way. Poor awareness                             Pertinent Vitals/Pain Pain Assessment Pain Assessment: PAINAD Faces Pain Scale: Hurts a little bit Breathing: normal Negative Vocalization: none Facial Expression: smiling or inexpressive Body Language: relaxed Consolability: no need to console PAINAD Score: 0 Pain Location: unable to elaborate    Home Living Family/patient expects to be discharged to:: Private residence Living Arrangements: Spouse/significant other Available Help at Discharge: Family;Available 24 hours/day Type of Home: House Home Access: Stairs to enter Entrance Stairs-Rails: Can reach both Entrance Stairs-Number of Steps: garage 6 steps w/ railing; front steps 10 steps b/l railing Alternate Level Stairs-Number of Steps: full flight does not use upstairs Home Layout: Two level;Able to live on main level with bedroom/bathroom Home Equipment: Shower seat;Grab bars - toilet;Grab bars - tub/shower;Adaptive equipment;Rolling Walker (2 wheels);Cane - single point Additional Comments: does not use AD    Prior Function Prior Level of Function : Needs assist             Mobility Comments: using cane prior to admission ADLs Comments: assist for IADL's including cooking, cleaning, medication management     Hand Dominance   Dominant Hand: Right    Extremity/Trunk Assessment   Upper Extremity Assessment Upper Extremity Assessment: Defer to OT evaluation    Lower Extremity Assessment Lower Extremity Assessment: LLE deficits/detail LLE Deficits / Details: patient with left LE buckling at times with mobility. Poor awareness to left side deficits. LLE Coordination: decreased gross motor;decreased fine  motor    Cervical / Trunk Assessment Cervical / Trunk Assessment: Normal  Communication   Communication: No difficulties  Cognition Arousal/Alertness: Awake/alert Behavior During Therapy: WFL for tasks assessed/performed, Impulsive Overall Cognitive Status: No family/caregiver present to determine baseline cognitive functioning Area of Impairment: Safety/judgement, Awareness, Problem solving                         Safety/Judgement: Decreased awareness of safety, Decreased awareness of deficits Awareness: Emergent Problem Solving: Requires verbal cues, Requires tactile cues, Difficulty sequencing          General Comments      Exercises     Assessment/Plan    PT Assessment Patient needs continued PT services  PT Problem List Decreased strength;Decreased mobility;Decreased safety awareness;Decreased activity tolerance;Decreased balance;Decreased cognition;Decreased range of motion       PT Treatment Interventions DME instruction;Therapeutic exercise;Gait training;Balance training;Neuromuscular re-education;Functional mobility training;Therapeutic activities;Patient/family education    PT Goals (Current goals can be found in the Care Plan section)  Acute Rehab PT Goals Patient Stated Goal: to go home PT Goal Formulation: With patient Time For Goal Achievement: 02/07/22 Potential to Achieve Goals: Fair    Frequency Min 4X/week     Co-evaluation PT/OT/SLP Co-Evaluation/Treatment: Yes Reason for Co-Treatment: For patient/therapist safety;To address functional/ADL transfers PT goals addressed during session: Mobility/safety with mobility;Balance         AM-PAC PT "6 Clicks" Mobility  Outcome Measure Help needed turning from your back to your side while in a flat  bed without using bedrails?: A Little Help needed moving from lying on your back to sitting on the side of a flat bed without using bedrails?: A Little Help needed moving to and from a bed to a  chair (including a wheelchair)?: A Lot Help needed standing up from a chair using your arms (e.g., wheelchair or bedside chair)?: A Lot Help needed to walk in hospital room?: A Lot Help needed climbing 3-5 steps with a railing? : Total 6 Click Score: 13    End of Session Equipment Utilized During Treatment: Gait belt Activity Tolerance: Patient tolerated treatment well Patient left: in chair;with call bell/phone within reach;with chair alarm set Nurse Communication: Mobility status PT Visit Diagnosis: Unsteadiness on feet (R26.81);Other abnormalities of gait and mobility (R26.89);Muscle weakness (generalized) (M62.81);Difficulty in walking, not elsewhere classified (R26.2);Ataxic gait (R26.0)    Time: 1610-9604 PT Time Calculation (min) (ACUTE ONLY): 26 min   Charges:   PT Evaluation $PT Eval Moderate Complexity: 1 Mod          Annita Ratliff, PT, GCS 01/24/22,11:41 AM

## 2022-01-24 NOTE — TOC Benefit Eligibility Note (Signed)
Patient Advocate Encounter ? ?Insurance verification completed.   ? ?The patient is currently admitted and upon discharge could be taking dabigatran (Pradaxa) 150 mg capsules. ? ?The current 30 day co-pay is, $95.00.  ? ?The patient is insured through [employer] Part D  ? ? ? ?Lyndel Safe, CPhT ?Pharmacy Patient Advocate Specialist ?[facility] Patient Advocate Team ?Direct Number: [phone]  Fax: [phone] ? ? ? ? ? ?  ?

## 2022-01-24 NOTE — Assessment & Plan Note (Addendum)
Patient presenting to ED with gait abnormality, left-sided weakness.  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  History of left to right interatrial shunt noted on TTE 11/22.  Currently on aspirin and Eliquis, denies missed doses. MR brain with and without contrast with multiple mostly punctate acute infarcts in the right greater than left cerebral hemispheres and left cerebellar hemisphere with larger area of infarction in the posterior right frontal lobe suspicion for embolic etiology, area of enhancement left cerebral hemisphere favored to represent a subacute infarct.  CTA head/neck with no large vessel occlusion or significant cranial stenosis.  TTE 1/23 with LVEF 50-55%, no regional wall motion normalities, grade 1 diastolic dysfunction, aortic dilation 39 mm.  LDL 85.  Hemoglobin A1c 8.0.  Neurology was consulted and followed during hospital course.  Eliquis was discontinued in favor of Lovenox 120 mg subcutaneously daily per recommendations of hematology/oncology.  We will continue aspirin 81 mg p.o. daily.  Continue atorvastatin 40 mg p.o. daily.  Outpatient follow-up with neurology month.  Discharging to [facility] inpatient rehabilitation. ? ? ?

## 2022-01-24 NOTE — Assessment & Plan Note (Addendum)
Vascular duplex ultrasound 12/06/2021 with age-indeterminate DVT left peroneal veins. Eliquis changed to Lovenox 120 mg [place] q24h per oncology recommendations ?

## 2022-01-24 NOTE — Progress Notes (Signed)
[place] CSW Progress Note ? ?Clinical Social Worker contacted caregiver by phone to follow-up on scheduled counseling appointment.  CSW spoke with patient's spouse and primary caregiver Robert Warner,Robert Warner [phone] to confirm appointment for 01/24/2022.  [name] stated patient was in the ED and would like to reschedule for Wednesday 01/25/2022,via MyChart telehealth. CSW confirmed change in scheduled appointment.  CSW stated I would contact [name] n 01/24/2022 to confirm appointment, [name] verbalized understanding. ? ?Robert Warner , LCSW ?

## 2022-01-24 NOTE — Progress Notes (Signed)
Inpatient Rehab Admissions Coordinator:  ? ?Per therapy recommendations,  patient was screened for CIR candidacy by Victorious Kundinger, MS, [name]. At this time, Pt. Appears to be a a potential candidate for CIR. I will place   order for rehab consult per protocol for full assessment. Please contact me any with questions. ? ?Newell Frater, MS, [name] ?Rehab Admissions Coordinator  ?[phone] (celll) ?[phone] (office) ? ?

## 2022-01-24 NOTE — Progress Notes (Signed)
?  Transition of Care (TOC) Screening Note ? ? ?Patient Details  ?Name: Robert Warner ?Date of Birth: 12-21-1940 ? ? ?Transition of Care (TOC) CM/SW Contact:    ?Pollie Friar, RN ?Phone Number: ?01/24/2022, 3:22 PM ? ? ? ?Transition of Care Department [facility]) has reviewed patient. We will continue to monitor patient advancement through interdisciplinary progression rounds. If new patient transition needs arise, please place a TOC consult. ?  ?

## 2022-01-24 NOTE — Progress Notes (Addendum)
STROKE TEAM PROGRESS NOTE  ? ?ATTENDING NOTE: ?I reviewed above note and agree with the assessment and plan. Pt was seen and examined.  ? ?81 year old male with history of diabetes, hypertension, hyperlipidemia, CKD 3, metastasis cancer likely hepatobiliary source, history of embolic strokes admitted for left-sided weakness and difficulty walking. ? ?In 09/2021 patient admitted for imbalance, MRI showed embolic shower, CT head and neck unremarkable.  EF 55 to 60%, TCD bubble study showed positive PFO.  DVT negative.  LDL 97, A1c 6.9.  Put on DAPT and pravastatin 40, pending liver biopsy. ? ?In 11/2021 patient admitted again for gait imbalance.  CTx2 showed new bilateral cerebellum infarcts.  MRI again showed embolic shower.  CT head and neck unremarkable.  CTP unremarkable.  EF 50 to 55%.  LE venous Doppler showed left lower extremity peroneal DVT.  LDL 59, A1c 6.9.  Put on Eliquis given DVT and hypercoagulable state. ? ?11/2020 patient also had Klebsiella bacteremia treated with Rocephin for 2 weeks.  Patient follow-up with neurology outpatient for stroke and also for right foot drop. ? ?On this admission MRI again showed embolic shower with largest at right MCA territory.  UDS negative.  CT no acute finding.  UA WBC more than 50.  CT head and neck no significant vascular stenosis.  However right upper lobe lung nodule increased in size.  LE venous Doppler no acute DVT but chronic left peroneal DVT.  LDL 85, A1c pending.  Creatinine 1.22.   ? ?On exam, pt daughter at the bedside.  Patient awake alert, orientated x3.  Denies headache.  No aphasia, fluent language, follows simple commands.  Able to name and repeat.  No gaze palsy, visual field full, no significant facial droop.  Left upper extremity 3/5, left lower extremity 4/5.  Right upper and lower 5/5.  Sensation symmetrical, finger-to-nose grossly intact although slow on the left. ? ?Etiology for patient embolic stroke again likely due to hypercoagulable state in  the setting of advanced cancer.  Currently patient compliant with Eliquis at home, indicating Eliquis failure at this time.  Patient has stopped chemotherapy due to intolerance, plan to follow-up with [name] soon for abdominal ultrasound for evaluation.  Currently CTA neck showed right upper lobe nodule increased size, concerning for continued malignancy progression.  Discussed with [name] and he recommend switch from Eliquis to Lovenox for further stroke prevention.  Continue home Lipitor 40, against to increase statin dosage given hepatobiliary source malignancy. ? ?For detailed assessment and plan, please refer to above as I have made changes wherever appropriate.  ? ?Rosalin Hawking, MD PhD ?Stroke Neurology ?01/24/2022 ?5:33 PM ? ?I discussed with Dr. [place] and [name]. I spent extensive time with the patient and her daughter, more than 50% of which was spent in counseling and coordination of care, reviewing test results, images and medication, and discussing the diagnosis, treatment plan and potential prognosis. This patient's care requiresreview of multiple databases, neurological assessment, discussion with family, other specialists and medical decision making of high complexity. ? ?  ? ? ?INTERVAL HISTORY ?His daughter is at the bedside. Patient was awake, alert, seen sitting OOB comfortably. Oriented to age, year, location and situation, month, but not year (stated 2003). Speech was spontaneous. Reported and demonstrated left-sided weakness. Patient has appointment on 3/22 for pan[facility]CT at [facility] and appointment with outpatient oncologist, [name], MD on 3/24. After discussion with [name], will discharge patient with lovenox given failed eliquis trial. Patient denied headaches and voiced no  other concerns. ? ?Vitals:  ? 01/24/22 0100 01/24/22 0300 01/24/22 0901 01/24/22 1206  ?BP: (!) 139/98 (!) 139/95 (!) 140/96 120/77  ?Pulse: 87 80 83 81  ?Resp: '20 19 19 20  '$ ?Temp: 98 ?F  (36.7 ?C) 98 ?F (36.7 ?C) 98.6 ?F (37 ?C) 98.6 ?F (37 ?C)  ?TempSrc: Oral Oral Oral Oral  ?SpO2: 100% 98% 99% 100%  ?Weight:      ?Height:      ? ?CBC:  ?Recent Labs  ?Lab 01/23/22 ?0700  ?WBC 4.7  ?NEUTROABS 2.6  ?HGB 11.9*  ?HCT 37.1*  ?MCV 89.0  ?PLT 155  ? ?Basic Metabolic Panel:  ?Recent Labs  ?Lab 01/23/22 ?0700  ?NA 137  ?K 4.4  ?CL 102  ?CO2 28  ?GLUCOSE 123*  ?BUN 24*  ?CREATININE 1.22  ?CALCIUM 10.0  ? ?Lipid Panel:  ?Recent Labs  ?Lab 01/24/22 ?6503  ?CHOL 158  ?TRIG 47  ?HDL 64  ?CHOLHDL 2.5  ?VLDL 9  ?[place] 85  ? ?HgbA1c: No results for input(s): HGBA1C in the last 168 hours. ?Urine Drug Screen:  ?Recent Labs  ?Lab 01/23/22 ?5465  ?LABOPIA NONE DETECTED  ?COCAINSCRNUR NONE DETECTED  ?LABBENZ NONE DETECTED  ?AMPHETMU NONE DETECTED  ?THCU NONE DETECTED  ?LABBARB NONE DETECTED  ? ?Alcohol Level  ?Recent Labs  ?Lab 01/23/22 ?0700  ?ETH <10  ? ? ?IMAGING past 24 hours ?CT ANGIO HEAD NECK W WO CM ? ?Result Date: 01/24/2022 ?CLINICAL DATA:  Stroke suspected EXAM: CT ANGIOGRAPHY HEAD AND NECK TECHNIQUE: Multidetector CT imaging of the head and neck was performed using the standard protocol during bolus administration of intravenous contrast. Multiplanar CT image reconstructions and MIPs were obtained to evaluate the vascular anatomy. Carotid stenosis measurements (when applicable) are obtained utilizing NASCET criteria, using the distal internal carotid diameter as the denominator. RADIATION DOSE REDUCTION: This exam was performed according to the departmental dose-optimization program which includes automated exposure control, adjustment of the mA and/or kV according to patient size and/or use of iterative reconstruction technique. CONTRAST:  19m OMNIPAQUE IOHEXOL 350 MG/ML SOLN COMPARISON:  CT head 01/23/2022, CTA 12/04/2021. FINDINGS: CT HEAD FINDINGS Brain: No evidence of acute infarction, hemorrhage, cerebral edema, mass, mass effect, or midline shift. No hydrocephalus or extra-axial fluid collection.  Periventricular white matter changes, likely the sequela of chronic small vessel ischemic disease. Remote lacunar infarcts in the right thalamus, bilateral basal ganglia, and bilateral cerebellar hemispheres. Vascular: No hyperdense vessel. Skull: Normal. Negative for fracture or focal lesion. Sinuses/Orbits: No acute finding. Other: The mastoid air cells are well aerated. CTA NECK FINDINGS Aortic arch: Two-vessel arch with a common origin of the brachiocephalic and left common carotid arteries. Imaged portion shows no evidence of aneurysm or dissection. No significant stenosis of the major arch vessel origins. Right carotid system: No evidence of dissection, stenosis (50% or greater) or occlusion. Left carotid system: No evidence of dissection, stenosis (50% or greater) or occlusion. Vertebral arteries: No evidence of dissection, stenosis (50% or greater) or occlusion. Skeleton: No acute osseous abnormality. Degenerative changes in the cervical spine. Other neck: No acute finding. Upper chest: Right upper lobe nodule measures up to 11 x 12 mm in the axial plane (series 8, image 681), previously 8 x 9 mm when remeasured similarly. Other nodular opacity superior to this mass are also unchanged (series 8, image 653). No pleural effusion. Review of the MIP images confirms the above findings CTA HEAD FINDINGS Anterior circulation: Both internal carotid arteries are patent to the termini,  without significant stenosis. Hypoplastic right A1. A1 segments patent. Normal anterior communicating artery. Anterior cerebral arteries are patent to their distal aspects. No M1 stenosis or occlusion. Normal MCA bifurcations. Distal MCA branches perfused and symmetric. Posterior circulation: Vertebral arteries patent to the vertebrobasilar junction without stenosis. Posterior inferior cerebral arteries patent bilaterally. Basilar patent to its distal aspect. Superior cerebellar arteries patent bilaterally. Patent P1 segments. PCAs  perfused to their distal aspects without stenosis. The left posterior communicating artery is not visualized. The right posterior communicating artery is patent. Venous sinuses: As permitted by contrast timing

## 2022-01-24 NOTE — Progress Notes (Addendum)
HEMATOLOGY-ONCOLOGY PROGRESS NOTE ? ?ASSESSMENT AND PLAN: ?Poorly differentiated adenocarcinoma involving the liver ?Abdominal ultrasound 08/05/2021-indeterminate 95m solid mass in the right hepatic lobe.   ?MRI of the liver 09/06/2021-multifocal rim-enhancing lesions throughout both lobes of the liver.  Index lesion within the lateral dome of right hepatic lobe measures 1.1 cm, segment 4A lesion measures 1.6 x 1.4 cm, segment 2 lesion measures 0.8 x 0.7 cm, posteromedial margin of the right hepatic lobe subcapsular lesion measures 2.5 x 2.0 cm ?09/16/2021 CT angio neck-9 mm right upper lobe nodule ?Biopsy of a right liver mass on 09/23/2021.  Pathology shows poorly differentiated adenocarcinoma positive for cytokeratin 7, CDX2 and cytokeratin 20 and negative for TTF-1, PSA and prostein.  Differential diagnoses include pancreatobiliary and less likely upper GI. ?HER2 positive by FISH ?PD-L1 combined positive score 0% ?MSS equivocal, tumor mutation burden 0, ERBB2 amplification equivocal ?09/29/2021 CA 19-9 81, CEA 33 ?PET scan 10/10/2021-solitary 10 mm right upper lobe pulmonary nodule with minimal FDG uptake.  7.5 mm left internal mammary lymph node hypermetabolic with SUV max 49.44  Numerous hepatic lesions.  Periportal lymphadenopathy SUV max 8.18.  2 adjacent lesions noted in the mesentery in the right mid abdomen with somewhat irregular margins, SUV 8.09.  No hypermetabolic colonic lesion identified to suggest a primary colon cancer.  No pancreatic lesion.  No gastric lesion.  No retroperitoneal lymphadenopathy. ?Upper endoscopy 10/13/2021-no mass, H. pylori gastritis on gastric biopsy ?Cycle 1 FOLFOX 10/19/2021 ?Cycle 2 FOLFOX 11/02/2021, Emend added, oxaliplatin dose reduced ?Cycle 3 FOLFOX 11/16/2021 ?Cycle 4 FOLFOX 11/30/2021 ?CT abdomen/pelvis with contrast on 12/05/2021-stable exam with bilobar hepatic masses, findings suggestive of metastatic gallbladder cancer. ?Hospital admission 09/16/2021 - 09/19/2021 with  gait disturbance-multifocal embolic stroke on MRI 196/05/5915 echo with interatrial shunt, transcranial Doppler with bubble study indicative of a medium size right to left shunt, lower extremity Doppler studies negative for DVT ?Hospital admission 09/20/2021 - 09/22/2021 with chest pain-CT coronary with no acute findings.   ?Diabetes ?Hypertension ?BPH ?Chronic kidney disease ?Brain CT 11/18/2021-new "lesion" in the left cerebellum when compared to brain MRI 09/16/2021.  Appearance of rapid development suspicious for a subacute infarct.  Metastatic focus not excluded.  Follow-up brain MRI with contrast recommended in the next 4 to 6 weeks. ?[facility], Heparin then apixaban anticoagulation ?10.  Klebsiella bacteremia 12/05/2021-no apparent source for infection ?11.  Thrombocytopenia secondary to chemotherapy and bacteremia ?12.  Left peroneal DVT 12/05/2021 ?13.  Hospital admission 39/39/0300-PQZRAembolic stroke ?MRI brain 01/23/2022-multiple, mostly punctate acute infarcts in the right greater than left cerebral hemisphere and left cerebellum with a larger area of infarction in the posterior right frontal lobe, subacute infarct in the left cerebellar hemisphere ?Lower extremity Dopplers 01/23/2022-chronic left peroneal DVT ? 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  He has residual left-sided weakness.  Neurology is following and he is on aspirin and continues to take Eliquis 5 mg twice a day.  Ongoing work-up per neurology.  He is being evaluated for CIR versus SNF for rehab. ? ?He is currently off chemotherapy.  He was due for restaging CT chest/abdomen/pelvis next week.  Wife is asking if these can be performed while he is admitted.  Dr. SBenay Spiceto address. ? ?He has been on Eliquis 5 mg twice a day and despite this had not been a embolic stroke.  The patient will be seen by Dr. SBenay Spiceto determine if we should continue Eliquis versus switching  to Lovenox.  Final recommendations pending evaluation by his primary medical oncologist. ? ?Recommendations: ?1.  Further work-up for CVA per neurology. ?2.  Continue aspirin 81 mg daily.  We will consider switching him from Eliquis to Lovenox. ?3.  He is due for restaging CT scans.  [name] to review and decide if we should do these as an inpatient. ?4.  PT/OT/ST evaluation.  Will need CIR versus SNF for rehab once stable. ? 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  He is again admitted with strokes.  He has persistent left-sided weakness.  The acute stroke occurred while on apixaban.  I recommend changing anticoagulation therapy to Lovenox if neurology is in agreement. ?He is due for restaging CTs to evaluate the metastatic carcinoma.  I will order the CTs for while he is in the hospital.  I will discuss continuing systemic therapy versus changing to a comfort care approach with [name] when the CT results are available. ? ?I was present for greater than 50% today's visit.  I performed medical stage making. ? ?I was present for greater than 50% of today's visit.  I performed medical decision making. 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  Has been on chemotherapy recently with FOLFOX.  Last dose given on 11/30/2021.  Chemotherapy has been on hold secondary to poor performance status.  Now admitted with acute embolic stroke.  Neurology is following.  He was on Eliquis as an outpatient with no missed doses.  Currently on aspirin and remains on Eliquis 5 mg twice a day.  The patient's wife states that he was having some difficulty with his balance and worsening left-sided weakness. ? ?Oncology History  ?Cancer with unknown primary site [facility])  ?10/11/2021 Initial Diagnosis  ? Cancer with unknown primary site [facility]) ?  ?10/19/2021 -  Chemotherapy  ? Patient is on Treatment Plan : COLORECTAL FOLFOX  q14d  ?   ? ?PHYSICAL EXAMINATION: ? ?Vitals:  ? 01/24/22 1206 01/24/22 1556  ?BP: 120/77 122/87  ?Pulse: 81 81  ?Resp: 20 19  ?Temp: 98.6 ?F (37 ?C) 98.1 ?F (36.7 ?C)  ?SpO2: 100% 100%  ? ?Filed Weights  ? 01/23/22 0654  ?Weight: 83 kg  ? ? ?Intake/Output from previous day: ?03/13 0701 - 03/14 0700 ?In: 98.2 [IV Piggyback:98.2] ?Out: -  ? ?Physical Exam ?Vitals reviewed.  ?Constitutional:   ?   General: He is not in acute distress. ?HENT:  ?   Head: Normocephalic.  ?   Mouth/Throat:  ?   Pharynx: No oropharyngeal exudate or posterior oropharyngeal erythema.  ?Eyes:  ?   General: No scleral icterus. ?Cardiovascular:  ?   Rate and Rhythm: Normal rate.  ?Pulmonary:  ?   Effort: Pulmonary effort is normal. No respiratory distress.  ?Abdominal:  ?   Palpations: Abdomen is soft.  ?Musculoskeletal:  ?   Comments: Strength in the right upper and lower extremities 5/5.  Strength in the left lower extremity 4/5 and strength in the left upper extremity 3/5.  ?Skin: ?   General: Skin is warm and dry.  ?Neurological:  ?   Mental Status: He is alert and oriented to person, place, and time.  ? ? ?LABORATORY DATA:  ?I have reviewed the data as listed ?CMP Latest Ref Rng & Units 01/23/2022 12/28/2021 12/14/2021  ?Glucose 70 - 99 mg/dL 123(H) 250(H) 311(H)  ?BUN 8 - 23 mg/dL 24(H) 14 16  ?Creatinine 0.61 - 1.24 mg/dL  1.22 1.04 0.95  ?Sodium 135 - 145 mmol/L 137 136 133(L)  ?Potassium 3.5 - 5.1 mmol/L 4.4 4.1 4.3  ?Chloride 98 - 111 mmol/L 102 101 100  ?CO2 22 - 32 mmol/L _0 ?Calcium 8.9 - 10.3 mg/dL 10.0 9.5 9.1  ?Total Protein 6.5 - 8.1 g/dL 7.3 6.8 6.2(L)  ?Total Bilirubin 0.3 - 1.2 mg/dL 0.8 0.8 0.7  ?Alkaline Phos 38 - 126 U/L 130(H) 85 83  ?AST 15 - 41 U/L 38 32 37  ?ALT 0 - 44 U/L 33 23 24  ? ? ?Lab Results  ?Component Value Date  ? WBC 4.7 01/23/2022  ? HGB 11.9 (L) 01/23/2022  ? HCT 37.1 (L) 01/23/2022  ? MCV 89.0 01/23/2022  ? PLT 155 01/23/2022  ? NEUTROABS 2.6 01/23/2022  ? ? ?Lab Results  ?Component Value Date  ? CEA  38.62 (H) 12/28/2021  ? [id number] 117 (H) 12/28/2021  ? ? ?CT ANGIO HEAD NECK W WO CM ? ?Result Date: 01/24/2022 ?CLINICAL DATA:  Stroke suspected EXAM: CT ANGIOGRAPHY HEAD AND NECK TECHNIQUE: Multidetector CT

## 2022-01-24 NOTE — Hospital Course (Signed)
Robert Warner is a 81 year old male with past medical history significant for multiple CVAs, intra-atrial shunt, metastatic adenocarcinoma of the liver of unknown primary on chemotherapy, left peroneal vein DVT January 2023 DM2, BPH who initially presented to [place] on 3/13 with unsteady gait and left-sided weakness.  Patient states woke up from bed at 5 AM attempting to use the bathroom but felt unsteady with ambulation with both right upper/lower extremity weakness.  Patient utilizes a cane at baseline.  Patient also reports dysuria for the last several days.  Denies headache, no blurred vision, no slurred speech or facial droop.  Further denies abdominal pain, no nausea/vomiting, no fever, no chest pain or palpitations. ? ?Patient with history of embolic stroke November 9373 with similar symptoms.  Echocardiogram showing intra atrium right to left shunt.  Was on Plavix and a statin for secondary prevention but had a breakthrough stroke January 2023. Given history of left to right shunt lower extremity venous duplex was ordered which was positive for acute DVT. He was treated with IV heparin and ultimately placed on Eliquis. He was also incidentally found during that hospitalization to have Klebsiella bacteremia on 12/05/2020 and treated initially on IV Rocephin and narrowed to IV cefazolin and then to cefadroxil for a total of 2 weeks. Had outpatient follow up with ID on 01/03/2022 and was released due to no signs of infection.  ? ?In the ED, temperature 98.4 ?F, HR 84, RR 16, BP 151/83, SPO2 97% on room air.  WBC 4.7, hemoglobin 11.9, platelets 155.  Sodium 137, potassium 4.4, chloride 102, CO2 28, glucose 123, BUN 24, creatinine 1.22.  AST 38, ALT 33, total bilirubin 0.8.  COVID-19 PCR negative.  Influenza A/B PCR negative.  EtOH level less than 10.  Urinalysis with large leukocytes, positive nitrite, many bacteria, greater than 50 WBCs.  CT head without contrast with no acute intracranial  abnormalities, chronic small vessel ischemic disease and brain atrophy, remote lacunar infarcts within the bilateral basal ganglia, right thalamus and bilateral cerebral hemispheres.  CT angiogram head/neck with no intracranial large vessel occlusion or significant stenosis, notable interval increase right upper lobe nodule measuring 12 mm, previously 9 mm.  MR brain with and without contrast with multiple mostly punctate acute infarcts in the right greater than left cerebral hemispheres and left cerebellar hemisphere with larger area of infarction in the posterior right frontal lobe suspicion for embolic etiology, area of enhancement left cerebral hemisphere favored to represent a subacute infarct.  Patient was assessed by teleneurology with recommendations of transfer to [facility] for continued work-up and neurology evaluation.   ? ? ?

## 2022-01-24 NOTE — Progress Notes (Signed)
Cbg 64. Snack given and juice.  ?

## 2022-01-24 NOTE — Evaluation (Signed)
Occupational Therapy Evaluation ?Patient Details ?Name: Robert Warner ?MRN: [id number] ?DOB: 07-31-1941 ?Today's Date: 01/24/2022 ? ? ?History of Present Illness 81 yo male who presented with unsteady gait and left sided weakness. CT head was negative for any acute findings, MRI reveals multiple infarcts in L cereblloar region and L & R cerebral regions. PMH includes multiple CVA,intra-atrial shunt, metastatic adenocarcinoma of liver of unknown primary on chemotherapy, left peroneal vein DVT 11/2021.  ? ?Clinical Impression ?  ?Pt admitted with the above diagnoses and presents with below problem list. Pt will benefit from continued acute OT to address the below listed deficits and maximize independence with basic ADLs prior to d/c to venue below. At baseline, pt was mod I with ADLs (cane). Pt presented with left side hemiparesis, left side neglect, impulsivity, decreased balance, impaired coordination, decreases safety awareness, and impaired cognition impacting current assist level with basic ADLs. Pt currently needs up to max A +2 for safety with functional mobility to walk to the bathroom, multiple LOB to the left side; mod-max A with UB/LB ADLs. Pt lives with spouse, daughter lives locally. Pt eager to mobilize and states he is familiar with working with therapies. Recommend intensive AIR rehab at time of d/c. ?  ?   ? ?Recommendations for follow up therapy are one component of a multi-disciplinary discharge planning process, led by the attending physician.  Recommendations may be updated based on patient status, additional functional criteria and insurance authorization.  ? ?Follow Up Recommendations ? Acute inpatient rehab (3hours/day)  ?  ?Assistance Recommended at Discharge Frequent or constant Supervision/Assistance  ?Patient can return home with the following A lot of help with bathing/dressing/bathroom;Direct supervision/assist for medications management;Direct supervision/assist for financial  management;Assistance with feeding;Assistance with cooking/housework;Two people to help with walking and/or transfers ? ?  ?Functional Status Assessment ? Patient has had a recent decline in their functional status and demonstrates the ability to make significant improvements in function in a reasonable and predictable amount of time.  ?Equipment Recommendations ? Other (comment) (defer to next venue)  ?  ?Recommendations for Other Services Rehab consult ? ? ?  ?Precautions / Restrictions Precautions ?Precautions: Fall ?Precaution Comments: High fall risk, poor safety awareness ?Restrictions ?Weight Bearing Restrictions: No  ? ?  ? ?Mobility Bed Mobility ?Overal bed mobility: Needs Assistance ?  ?  ?  ?  ?  ?  ?General bed mobility comments: close supervision due to poor awareness of deficits ?  ? ?Transfers ?Overall transfer level: Needs assistance ?Equipment used: Rolling walker (2 wheels) ?Transfers: Sit to/from Stand ?Sit to Stand: Mod assist, +2 safety/equipment ?  ?  ?  ?  ?  ?General transfer comment: patient required several attemts to get standing due to L side weakness. Poor balance. Impulsive. Difficulty maintaining grasp with left hand on rw switched to HHA +2 safety. to/from EOB, toilet, and recliner. ?  ? ?  ?Balance Overall balance assessment: Needs assistance ?Sitting-balance support: Feet supported ?Sitting balance-Leahy Scale: Poor ?Sitting balance - Comments: leaning to left in sitting, poor awareness. sat to brush teeth in supported sitting with up to mod A for dynamic sitting balance. ?Postural control: Left lateral lean ?Standing balance support: Single extremity supported, During functional activity, Reliant on assistive device for balance ?Standing balance-Leahy Scale: Poor ?Standing balance comment: LOB to his left with mobility, L LE giving way. Poor awareness ?  ?  ?  ?  ?  ?  ?  ?  ?  ?  ?  ?   ? ?  ADL either performed or assessed with clinical judgement  ? ?ADL Overall ADL's : Needs  assistance/impaired ?Eating/Feeding: Set up;Sitting ?Eating/Feeding Details (indicate cue type and reason): needs assistance opening containers ?Grooming: Minimal assistance;Sitting;Set up;Supervision/safety ?Grooming Details (indicate cue type and reason): assist for overhead, bilateral grooming tasks. assist d/t LUE deficits in strength and proprioception ?Upper Body Bathing: Minimal assistance;Sitting ?  ?Lower Body Bathing: Maximal assistance;Moderate assistance;Sit to/from stand ?  ?Upper Body Dressing : Moderate assistance;Sitting ?  ?Lower Body Dressing: Moderate assistance;Maximal assistance;Sit to/from stand ?  ?Toilet Transfer: Moderate assistance;Maximal assistance;+2 for safety/equipment;Ambulation;Comfort height toilet ?Toilet Transfer Details (indicate cue type and reason): HHA utilized as pt unable to maintain left hand grasp on walker. +2 for safetty d/t impulsivity and multiple LOB walking to the bathroom and during toileting tasks. ?Toileting- Clothing Manipulation and Hygiene: Minimal assistance;Sitting/lateral lean ?  ?Tub/ Shower Transfer: Gaffer;Moderate assistance;Maximal assistance;+2 for safety/equipment ?  ?Functional mobility during ADLs: Moderate assistance;Maximal assistance;+2 for safety/equipment ?General ADL Comments: Impulsive + decreased safety awareness + impaired balance + left side inattention and left side weakness impacting ADLs. Walked to bathroom with +2 assist for safety and up to max A d/t LOBs. Sat to complete oral care with assist. Recliner then brought to pt in lieu of walking across room.  ? ? ? ?Vision Baseline Vision/History: 1 Wears glasses ?Patient Visual Report: No change from baseline ?   ?   ?Perception Perception ?Comments: left lateral lean sitting at the sink. able to correct to midline posture with verbal cue. decreased attention to left side ?  ?Praxis   ?  ? ?Pertinent Vitals/Pain Pain Assessment ?Pain Assessment: Faces ?Faces Pain Scale: Hurts a  little bit ?Pain Location: unable to elaborate ?Pain Descriptors / Indicators: Discomfort ?Pain Intervention(s): Monitored during session  ? ? ? ?Hand Dominance Right ?  ?Extremity/Trunk Assessment Upper Extremity Assessment ?Upper Extremity Assessment: LUE deficits/detail ?LUE Deficits / Details: 3-/5 MMT shoulder and distal. Ataxia and decreased proprioception. Spilling various items (spit cup, creamer for coffee) during ADLs. Left hand hit face during AROM testing when cued to touch head after raising arms. Unable to maintain grasp on walker. Pt endorses "no sensation" LUE. ?LUE Sensation: decreased light touch;decreased proprioception ?LUE Coordination: decreased fine motor;decreased gross motor ?  ?Lower Extremity Assessment ?Lower Extremity Assessment: Defer to PT evaluation ?LLE Deficits / Details: patient with left LE buckling at times with mobility. Poor awareness to left side deficits. ?LLE Coordination: decreased gross motor;decreased fine motor ?  ?Cervical / Trunk Assessment ?Cervical / Trunk Assessment: Other exceptions (left flank weakness noted in seated position) ?Cervical / Trunk Exceptions: left flank weakness noted in seated position ?  ?Communication Communication ?Communication: No difficulties ?  ?Cognition Arousal/Alertness: Awake/alert ?Behavior During Therapy: Impulsive, WFL for tasks assessed/performed ?Overall Cognitive Status: No family/caregiver present to determine baseline cognitive functioning ?Area of Impairment: Safety/judgement, Awareness, Problem solving, Attention, Memory, Following commands ?  ?  ?  ?  ?  ?  ?  ?  ?  ?Current Attention Level: Sustained ?Memory: Decreased short-term memory, Decreased recall of precautions ?Following Commands: Follows one step commands consistently, Follows one step commands inconsistently ?Safety/Judgement: Decreased awareness of safety, Decreased awareness of deficits ?Awareness: Intellectual, Emergent ?Problem Solving: Requires verbal cues,  Requires tactile cues, Difficulty sequencing ?  ?  ?  ?General Comments    ? ?  ?Exercises   ?  ?Shoulder Instructions    ? ? ?Home Living Family/patient expects to be discharged to:: Private residence ?Living Arrangements

## 2022-01-25 ENCOUNTER — Inpatient Hospital Stay (HOSPITAL_COMMUNITY): Payer: Medicare HMO

## 2022-01-25 DIAGNOSIS — I82552 Chronic embolism and thrombosis of left peroneal vein: Secondary | ICD-10-CM | POA: Diagnosis not present

## 2022-01-25 DIAGNOSIS — E1165 Type 2 diabetes mellitus with hyperglycemia: Secondary | ICD-10-CM | POA: Diagnosis not present

## 2022-01-25 DIAGNOSIS — I639 Cerebral infarction, unspecified: Secondary | ICD-10-CM | POA: Diagnosis not present

## 2022-01-25 DIAGNOSIS — N3000 Acute cystitis without hematuria: Secondary | ICD-10-CM | POA: Diagnosis not present

## 2022-01-25 DIAGNOSIS — C801 Malignant (primary) neoplasm, unspecified: Secondary | ICD-10-CM | POA: Diagnosis not present

## 2022-01-25 LAB — BASIC METABOLIC PANEL
Anion gap: 9 (ref 5–15)
BUN: 21 mg/dL (ref 8–23)
CO2: 24 mmol/L (ref 22–32)
Calcium: 9.1 mg/dL (ref 8.9–10.3)
Chloride: 102 mmol/L (ref 98–111)
Creatinine, Ser: 1.09 mg/dL (ref 0.61–1.24)
GFR, Estimated: >60 mL/min (ref >60)
Glucose, Bld: 140 mg/dL — ABNORMAL HIGH (ref 70–99)
Potassium: 4.1 mmol/L (ref 3.5–5.1)
Sodium: 135 mmol/L (ref 135–145)

## 2022-01-25 LAB — GLUCOSE, CAPILLARY
Glucose-Capillary: 132 mg/dL — ABNORMAL HIGH (ref 70–99)
Glucose-Capillary: 86 mg/dL (ref 70–99)
Glucose-Capillary: 225 mg/dL — ABNORMAL HIGH (ref 70–99)
Glucose-Capillary: 122 mg/dL — ABNORMAL HIGH (ref 70–99)
Glucose-Capillary: 194 mg/dL — ABNORMAL HIGH (ref 70–99)

## 2022-01-25 IMAGING — CT CT CHEST-ABD-PELV W/ CM
2 of 5 series · 12 of 36 positions shown, 14 images · IV contrast (APPLIED)
Comparison: Multiple exams, including [DATE] abdomen CT and
PET-CT from [DATE]

CLINICAL DATA: Restaging metastatic carcinoma of unknown primary. *
Tracking Code: BO *

EXAM:
CT CHEST, ABDOMEN, AND PELVIS WITH CONTRAST
TECHNIQUE: Multidetector CT imaging of the chest, abdomen and pelvis was
performed following the standard protocol during bolus
administration of intravenous contrast.

[Series 3: cap 5.0 i31f 2 · axial · 0.78mm/px · z∈[+1088,+1603]mm · 9 of 129 slices shown, 11 images]
[im 13/129  mediastinal]
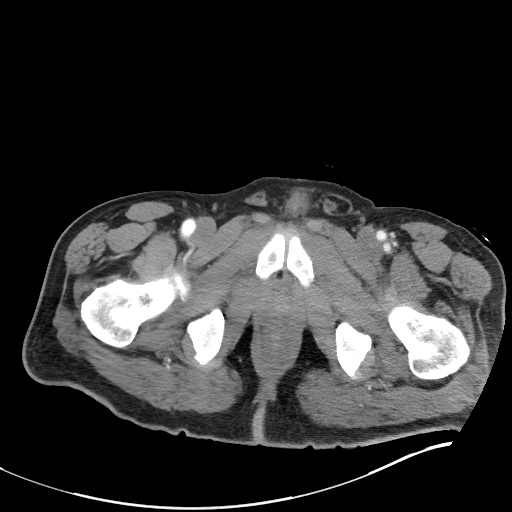
[im 13/129  bone]
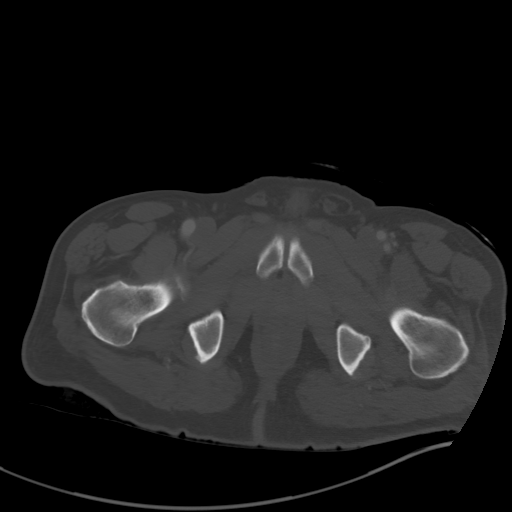
[im 26/129  mediastinal]
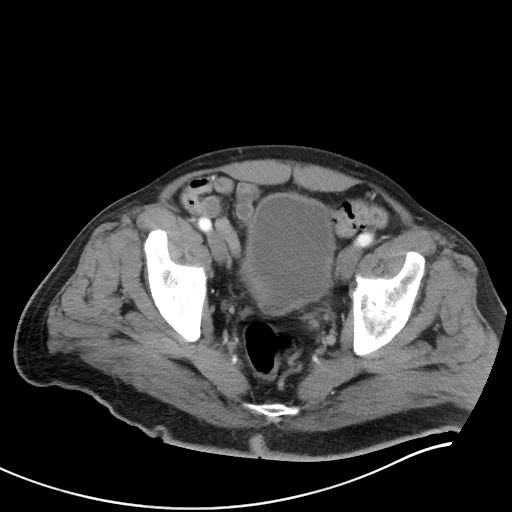
[im 39/129  mediastinal]
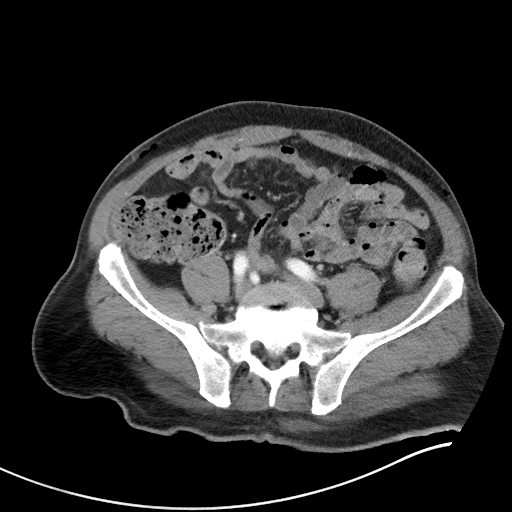
[im 52/129  mediastinal]
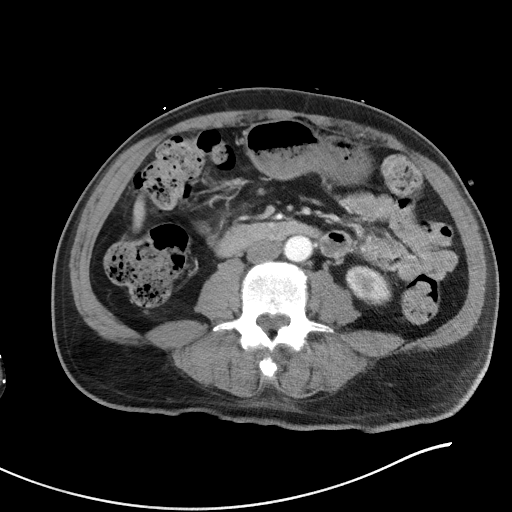
[im 65/129  mediastinal]
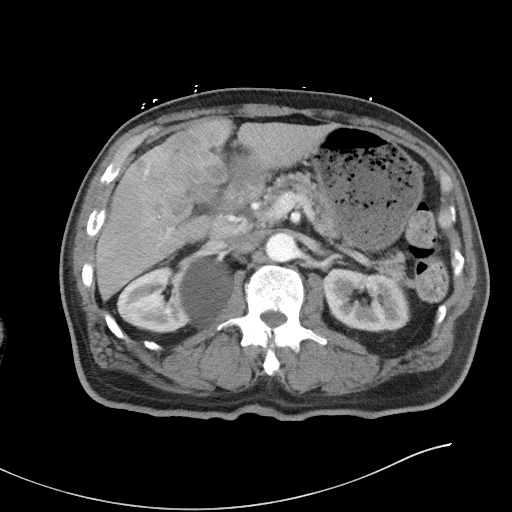
[im 77/129  mediastinal]
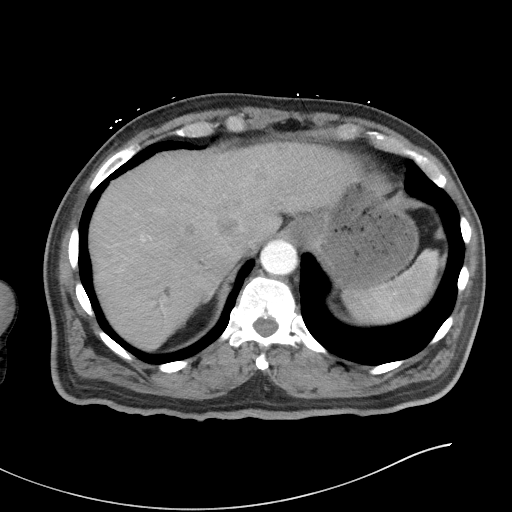
[im 90/129  mediastinal]
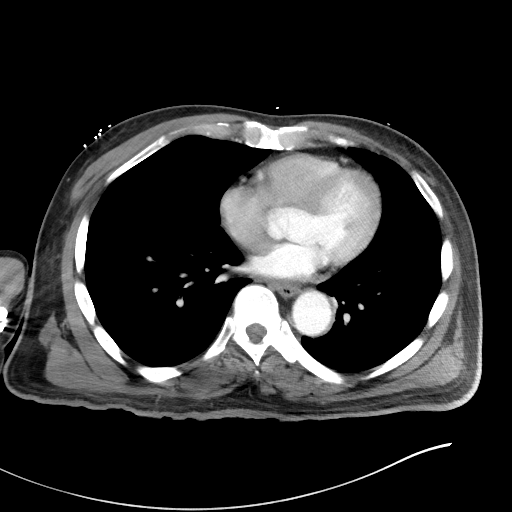
[im 103/129  mediastinal]
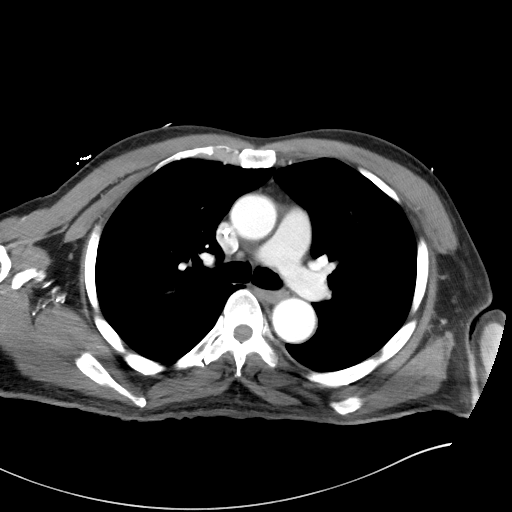
[im 116/129  mediastinal]
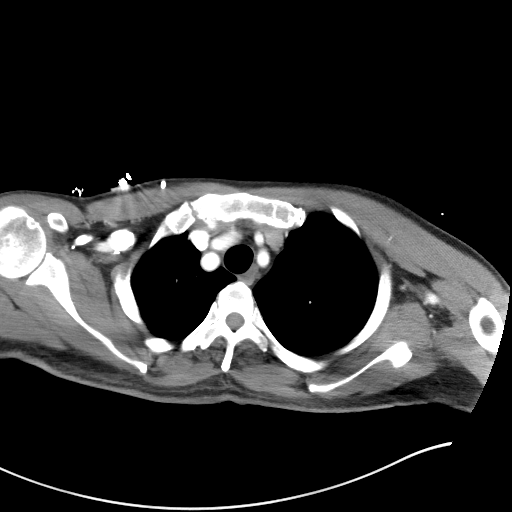
[im 116/129  bone]
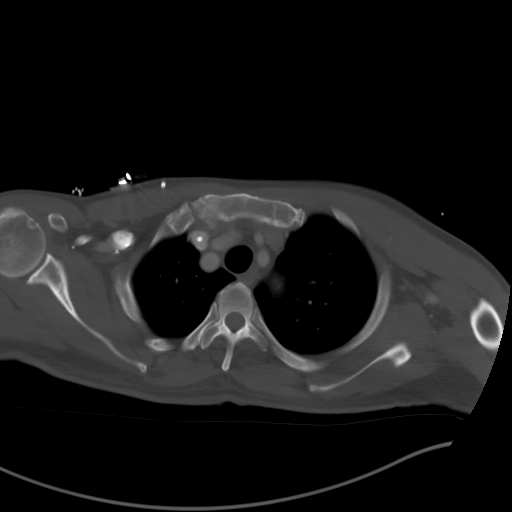

[Series 6: coronal · coronal · 0.78mm/px · 3 of 155 slices shown]
[im 31/155  mediastinal]
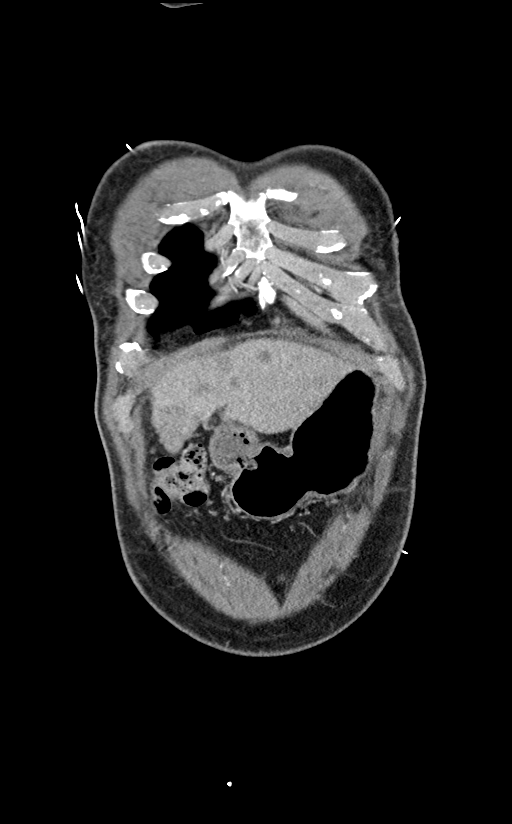
[im 62/155  mediastinal]
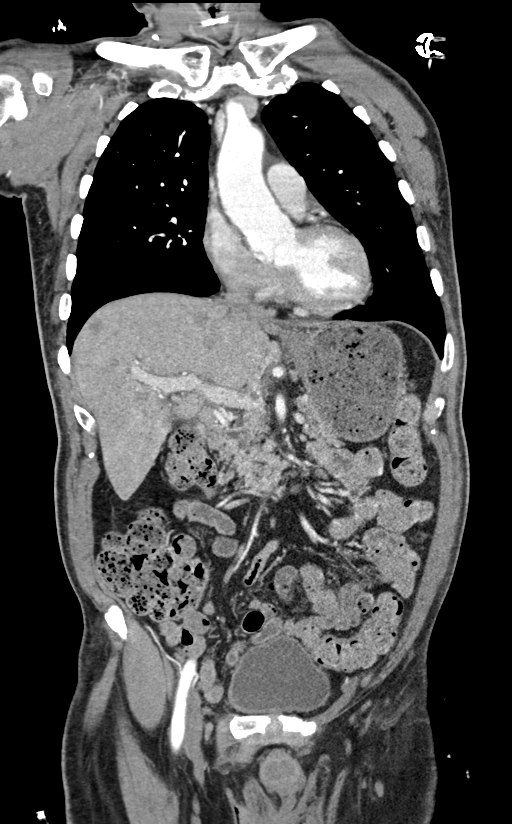
[im 93/155  mediastinal]
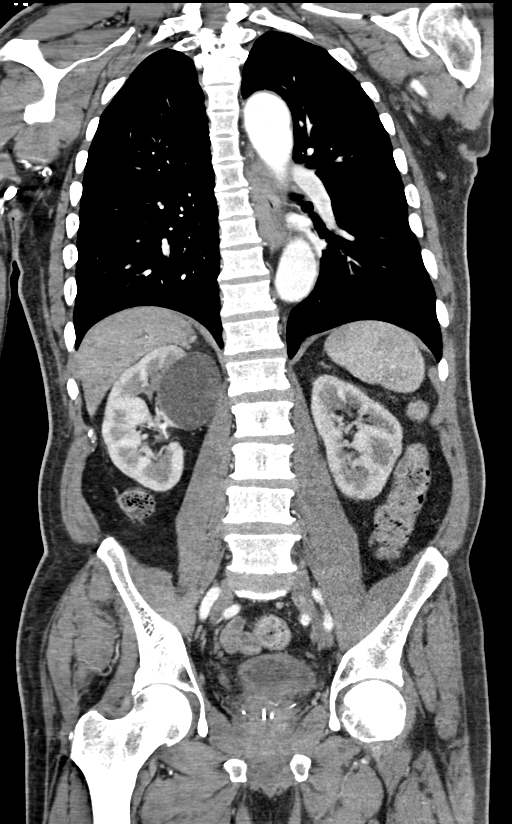

[12 of 36 positions shown; findings below may reference images not displayed]

RADIATION DOSE REDUCTION: This exam was performed according to the
departmental dose-optimization program which includes automated
exposure control, adjustment of the mA and/or kV according to
patient size and/or use of iterative reconstruction technique.

CONTRAST:  100mL OMNIPAQUE IOHEXOL 300 MG/ML  SOLN
FINDINGS: CT CHEST FINDINGS

Cardiovascular: Right Port-A-Cath tip: SVC. Aneurysm of the
descending aortic arch 3.5 cm transverse on image 18 series 3.

Mediastinum/Nodes: Left supraclavicular node 0.8 cm in short axis on
image 6 series 3 previously not well seen. Previously hypermetabolic
left internal mammary node measures 0.8 cm in short axis on image 23
series 3, previously the same on [DATE].

Lungs/Pleura: The right upper lobe pulmonary nodule measures 1.3 by
1.2 cm on image 58 of series 4 (previously 0.9 cm) and has an
unusual appearance of radiating cylindrical densities and confluent
pulmonary arterial and pulmonary venous branches extending to it, in
addition to localized tree-in-bud nodularity just above this
pulmonary nodule as shown on image 50 of series 4. While malignancy
is a concern given the enlarging nodule, alternative possibilities
may include atypical infectious process, unusual airway plugging, or
vascular malformation (although the enhancement of the nodule is
less than I would expect for a vascular malformation).

Musculoskeletal: Unremarkable

CT ABDOMEN PELVIS FINDINGS

Hepatobiliary: Increased metastatic burden to the liver with
generally enlarging lesions observed. For example, a right hepatic
lobe lesion anterior to the IVC measures 1.9 by 1.9 cm on image 54
series 3, formerly 1.5 by 1.2 cm. There are multiple new lesions
such as the 1.1 by 0.7 cm subcapsular nodule in the left hepatic
lobe lateral segment on image 59 series 3. Poor definition of the
gallbladder, there appears to be indistinct tumor along the porta
hepatis in the expected region of the gallbladder; mass involving
the gallbladder is not excluded. Indistinct accentuated density in
the expected region of the gallbladder lumen on image 67 series 3.

Pancreas: Tumor tracking along the porta hepatis abuts portions of
the pancreatic body but does not appear to be arising from the
pancreas.

Spleen: Unremarkable

Adrenals/Urinary Tract: The adrenal glands appear normal. Right
kidney upper pole cyst. The prostate gland indents the urinary
bladder. No hydronephrosis or renal mass identified.

Stomach/Bowel: Tumor along the porta hepatis abuts the margin of the
proximal duodenum. Prominent stool throughout the colon favors
constipation.

Vascular/Lymphatic: Abnormal stranding/tumor surrounding the common
hepatic artery and proper hepatic artery, and also abutting the
portal vein, without evidence of portal vein thrombosis. Hazy
infiltration of the porta hepatis. No tumor observed along the SMA.
A right gastric node measures 0.8 cm in short axis on image 55
series 3, previously the same on [DATE].

Reproductive: Prostatomegaly.  UroLift capsules noted.

Other: A previously hypermetabolic soft tissue nodule along the
mesentery of the hepatic flexure measures 1.3 by 1.3 by 1.0 cm on
image 77 series 3,, previously 1.0 by 0.8 by 1.0 cm on [DATE]

Musculoskeletal: Mild lumbar spondylosis and degenerative disc
disease.
IMPRESSION: 1. New and enlarging hepatic metastatic lesions. Mild enlargement of
the metastatic focus in the right hepatic flexure mesentery
(increased from [DATE], although reduced in size from
[DATE]).
2. Continued abnormal masslike appearance along the gallbladder and
porta hepatis likely reflecting malignancy.
3. Stable left internal mammary lymph node (previously
hypermetabolic). Increased left supraclavicular nodal tissue.
4. The right upper lobe nodule has increased in size and has some
unusual characteristics including linear marginal extensions.
Malignancy is a distinct possibility. There is also some tree-in-bud
nodularity just above this nodule which could be from
postobstructive atypical infectious bronchiolitis or local tumor
plugging of airways.
5. Aneurysm of the descending aortic arch 3.5 cm in transverse
diameter. Recommend annual imaging followup by CTA or MRA. This
recommendation follows [WL]
ACCF/AHA/AATS/ACR/ASA/SCA/TAKOOR/TAKOOR/TAKOOR/TAKOOR Guidelines for the
Diagnosis and Management of Patients with Thoracic Aortic Disease.
Circulation.[WL]; 121: E266-e369. Aortic aneurysm NOS ([WL]-[WL])
6. Prostatomegaly.

## 2022-01-25 MED ORDER — IOHEXOL 9 MG/ML PO SOLN
ORAL | Status: AC
  Filled 2022-01-25: qty 1000

## 2022-01-25 MED ORDER — IOHEXOL 300 MG/ML  SOLN
100.0000 mL | Freq: Once | INTRAMUSCULAR | Status: AC | PRN
  Administered 2022-01-25: 100 mL via INTRAVENOUS

## 2022-01-25 MED ORDER — INSULIN GLARGINE-YFGN 100 UNIT/ML ~~LOC~~ SOLN
16.0000 [IU] | Freq: Every day | SUBCUTANEOUS | Status: DC
  Administered 2022-01-26 – 2022-01-27 (×2): 16 [IU] via SUBCUTANEOUS
  Filled 2022-01-25 (×2): qty 0.16

## 2022-01-25 NOTE — Consult Note (Signed)
? ?  [facility] CM Inpatient Consult ? ? ?01/25/2022 ? ?[name] ?04/15/1941 ?[id number] ? ?[facility] [ACO] Patient: [employer] ? ?Primary Care Provider:  [name], MD is an embedded provider at [facility] with a Chronic Care Management team and program, and is listed for the transition of care follow up and appointments. ? ?Patient was screened for Embedded practice service needs for chronic care management and found to be active with the CCM Pharmacist. ? ?Plan: Followup can be sent to the Embedded Chronic Care Management  to update on TOC needs and information. ? ?Please contact for further questions, ? ?Natividad Brood, RN BSN CCM ?[facility] Liaison ? [phone] business mobile phone ?Toll free office [phone]  ?Fax number: [phone] ?[email or web address] ? ? ? ?

## 2022-01-25 NOTE — Progress Notes (Addendum)
STROKE TEAM PROGRESS NOTE  ? ?INTERVAL HISTORY ?Patient's wife was at bedside. Patient was seen laying in bed comfortably, awake, alert, pleasant and engaging in evaluation. Reported that Dr. Benay Warner has seen this patient this AM. Patient denied headaches, N/V. Still having left sided weakness largely unchanged from day before. ?Already started lovenox from heparin and dispo to CIR pending insurance, per RN, will likely know by 3/16. ? ?Vitals:  ? 01/24/22 1951 01/24/22 2325 01/25/22 0343 01/25/22 0732  ?BP: 126/77 (!) 130/92 (!) 118/91 134/86  ?Pulse: 81 70 87 76  ?Resp: '18 18 19 20  '$ ?Temp: 98.8 ?F (37.1 ?C) 98.1 ?F (36.7 ?C) 98.4 ?F (36.9 ?C) 98.1 ?F (36.7 ?C)  ?TempSrc: Oral Oral Oral Oral  ?SpO2: 100% 100% 99% 100%  ?Weight:      ?Height:      ? ?CBC:  ?Recent Labs  ?Lab 01/23/22 ?0700  ?WBC 4.7  ?NEUTROABS 2.6  ?HGB 11.9*  ?HCT 37.1*  ?MCV 89.0  ?PLT 155  ? ?Basic Metabolic Panel:  ?Recent Labs  ?Lab 01/23/22 ?0700 01/25/22 ?0122  ?NA 137 135  ?K 4.4 4.1  ?CL 102 102  ?CO2 28 24  ?GLUCOSE 123* 140*  ?BUN 24* 21  ?CREATININE 1.22 1.09  ?CALCIUM 10.0 9.1  ? ?Lipid Panel:  ?Recent Labs  ?Lab 01/24/22 ?3151  ?CHOL 158  ?TRIG 47  ?HDL 64  ?CHOLHDL 2.5  ?VLDL 9  ?Gervais 85  ? ?HgbA1c:  ?Recent Labs  ?Lab 01/24/22 ?7616  ?HGBA1C 8.0*  ? ?Urine Drug Screen:  ?Recent Labs  ?Lab 01/23/22 ?0737  ?LABOPIA NONE DETECTED  ?COCAINSCRNUR NONE DETECTED  ?LABBENZ NONE DETECTED  ?AMPHETMU NONE DETECTED  ?THCU NONE DETECTED  ?LABBARB NONE DETECTED  ? ?Alcohol Level  ?Recent Labs  ?Lab 01/23/22 ?0700  ?ETH <10  ? ? ?IMAGING past 24 hours ?VAS [place] LOWER EXTREMITY VENOUS (DVT) ? ?Result Date: 01/24/2022 ? Lower Venous DVT Study [name]  Date of Exam:   01/24/2022 Medical Rec #: [id number]        Accession #:    [id number] Date of Birth: 06/20/1941        Patient Gender: M Patient Age:   81 years Exam Location:  [facility] Procedure:      VAS [place] LOWER EXTREMITY VENOUS (DVT) Referring Phys: Cornelius Moras Rameen Quinney  --------------------------------------------------------------------------------  Indications: Stroke. Other Indications: HX of DVT. Comparison Study: Previous exam on 12/05/21 showed partial DVT in [name] Performing Technologist: Rogelia Rohrer RVT, RDMS  Examination Guidelines: A complete evaluation includes B-mode imaging, spectral Doppler, color Doppler, and power Doppler as needed of all accessible portions of each vessel. Bilateral testing is considered an integral part of a complete examination. Limited examinations for reoccurring indications may be performed as noted. The reflux portion of the exam is performed with the patient in reverse Trendelenburg.  +---------+---------------+---------+-----------+----------+--------------+ RIGHT    CompressibilityPhasicitySpontaneityPropertiesThrombus Aging +---------+---------------+---------+-----------+----------+--------------+ CFV      Full           Yes      Yes                                 +---------+---------------+---------+-----------+----------+--------------+ SFJ      Full                                                        +---------+---------------+---------+-----------+----------+--------------+  FV Prox  Full           Yes      Yes                                 +---------+---------------+---------+-----------+----------+--------------+ FV Mid   Full           Yes      Yes                                 +---------+---------------+---------+-----------+----------+--------------+ FV DistalFull           Yes      Yes                                 +---------+---------------+---------+-----------+----------+--------------+ PFV      Full                                                        +---------+---------------+---------+-----------+----------+--------------+ POP      Full           Yes      Yes                                  +---------+---------------+---------+-----------+----------+--------------+ PTV      Full                                                        +---------+---------------+---------+-----------+----------+--------------+ PERO     Full                                                        +---------+---------------+---------+-----------+----------+--------------+   +---------+---------------+---------+-----------+----------+-------------------+ LEFT     CompressibilityPhasicitySpontaneityPropertiesThrombus Aging      +---------+---------------+---------+-----------+----------+-------------------+ CFV      Full           Yes      Yes                                      +---------+---------------+---------+-----------+----------+-------------------+ SFJ      Full                                                             +---------+---------------+---------+-----------+----------+-------------------+ FV Prox  Full           Yes      Yes                                      +---------+---------------+---------+-----------+----------+-------------------+  FV Mid   Full           Yes      Yes                                      +---------+---------------+---------+-----------+----------+-------------------+ FV DistalFull           Yes      Yes                                      +---------+---------------+---------+-----------+----------+-------------------+ PFV      Full                                                             +---------+---------------+---------+-----------+----------+-------------------+ POP      Full                                                             +---------+---------------+---------+-----------+----------+-------------------+ PTV      Full                                                             +---------+---------------+---------+-----------+----------+-------------------+ PERO     Partial         Yes      Yes                  Chronic - one of                                                          paired vessels      +---------+---------------+---------+-----------+----------+-------------------+     Summary: BILATERAL: - No evidence of superficial venous thrombosis in the lower extremities, bilaterally. -No evidence of popliteal cyst, bilaterally. RIGHT: - There is no evidence of deep vein thrombosis in the lower extremity.  LEFT: - Findings consistent with chronic deep vein thrombosis involving the left peroneal veins.  *See table(s) above for measurements and observations. Electronically signed by Servando Snare MD on 01/24/2022 at 8:29:47 PM.    Final    ? ?PHYSICAL EXAM ?Constitutional: Appears well-developed and well-nourished.  ?Psych: Affect appropriate to situation ?Eyes: No scleral injection ?Respiratory: Effort normal, non-labored breathing ?Gait: Deferred  ? ?Neuro: ?Mental Status: ?Patient is awake, alert, oriented to person, place, month, year and situation,  ?Patient is able to give a clear and coherent history. ?No signs of aphasia or neglect ?Cranial Nerves: ?II: Visual Fields are full. Pupils are equal, round, and reactive to light.   ?III,IV, VI: EOMI without ptosis or diploplia.  ?V: Facial sensation is symmetric to temperature ?VII: Facial  movement is symmetric resting and smiling ?VIII: Hearing is intact to voice ?X: Palate elevates symmetrically ?XII: Tongue protrudes midline without atrophy or fasciculations ?Motor ?Tone is normal. Bulk is normal. LLE 4/5 and LUE 3/5 with drifting. Decreased strength in L hand grip. RUE and RLE 5/5 strength.  ?Sensory: ?Sensation is symmetric to light touch and temperature in the arms and legs. No extinction to DSS present.  ?Cerebellar: ?FNF intact bilaterally ? ? ?ASSESSMENT/PLAN ?Robert Warner is a 81 y.o. male with history of HLD, T2DM, HTN, CKD 3, multiple multiple embolic strokes (last 0/9628, 09/2021), intra-atrial shunt,  metastasis cancer likely hepatobiliary source, left peroneal vein DVT 11/2021, presented (01/23/2022) with left sided weakness and difficulty walking.  ? ?Stroke: Bilateral (R>L) cerebral punctate infarcts and left cerebellar infarct likely secondary embolism from hypercoagulable state secondary to advanced cancer ?Code Stroke CT head: No acute abnormality. Chronic small vessel ischemic disease and brain atrophy. Remote

## 2022-01-25 NOTE — Progress Notes (Signed)
?PROGRESS NOTE ? ? ? ?Robert Warner  HAL:[id number] DOB: [birth year] DOA: 01/23/2022 ?PCP: [name], MD  ? ? 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  Patient states woke up from bed at 5 AM attempting to use the bathroom but felt unsteady with ambulation with both right upper/lower extremity weakness.  Patient utilizes a cane at baseline.  Patient also reports dysuria for the last several days.  Denies headache, no blurred vision, no slurred speech or facial droop.  Further denies abdominal pain, no nausea/vomiting, no fever, no chest pain or palpitations. ? ?Patient with history of embolic stroke November 7353 with similar symptoms.  Echocardiogram showing intra atrium right to left shunt.  Was on Plavix and a statin for secondary prevention but had a breakthrough stroke January 2023. Given history of left to right shunt lower extremity venous duplex was ordered which was positive for acute DVT. He was treated with IV heparin and ultimately placed on Eliquis. He was also incidentally found during that hospitalization to have Klebsiella bacteremia on 12/05/2020 and treated initially on IV Rocephin and narrowed to IV cefazolin and then to cefadroxil for a total of 2 weeks. Had outpatient follow up with ID on 01/03/2022 and was released due to no signs of infection.  ? ?In the ED, temperature 98.4 ?F, HR 84, RR 16, BP 151/83, SPO2 97% on room air.  WBC 4.7, hemoglobin 11.9, platelets 155.  Sodium 137, potassium 4.4, chloride 102, CO2 28, glucose 123, BUN 24, creatinine 1.22.  AST 38, ALT 33, total bilirubin 0.8.  COVID-19 PCR negative.  Influenza A/B PCR negative.  EtOH level less than 10.  Urinalysis  with large leukocytes, positive nitrite, many bacteria, greater than 50 WBCs.  CT head without contrast with no acute intracranial abnormalities, chronic small vessel ischemic disease and brain atrophy, remote lacunar infarcts within the bilateral basal ganglia, right thalamus and bilateral cerebral hemispheres.  CT angiogram head/neck with no intracranial large vessel occlusion or significant stenosis, notable interval increase right upper lobe nodule measuring 12 mm, previously 9 mm.  MR brain with and without contrast with multiple mostly punctate acute infarcts in the right greater than left cerebral hemispheres and left cerebellar hemisphere with larger area of infarction in the posterior right frontal lobe suspicion for embolic etiology, area of enhancement left cerebral hemisphere favored to represent a subacute infarct.  Patient was assessed by teleneurology with recommendations of transfer to [facility] for continued work-up and neurology evaluation.   ? ?  ? ? ?Assessment & Plan: ?  ?Assessment and Plan: ?* Acute embolic stroke ([place]) ?Patient presenting to ED with gait abnormality, left-sided weakness.  History of embolic CVA 83/92 with recurrence 1/23.  History of left to right interatrial shunt noted on TTE 11/22.  Currently on aspirin and Eliquis, denies missed doses. MR brain with and without contrast with multiple mostly punctate acute infarcts in the right greater than left cerebral hemispheres and left cerebellar hemisphere with larger area of infarction in the posterior right frontal lobe suspicion for embolic etiology, area of enhancement left cerebral hemisphere favored to represent a subacute infarct.  CTA head/neck with no large vessel occlusion or significant cranial stenosis.  TTE 1/23 with LVEF 50-55%, no  regional wall motion normalities, grade 1 diastolic dysfunction, aortic dilation 39 mm.  LDL 85 ?--Neurology following, appreciate assistance ?--Hemoglobin A1c: 8.0 ?--PT/OT  recommended CIR; rehab MD consulted for evaluation ?--Eliquis changed to Lovenox 80 mg [place] BID ?--Aspirin 81 mg p.o. daily ?--Atorvastatin 40 mg p.o. daily ? ? ? ?UTI (urinary tract infection) ?Urinalysis with large leukocytes, positive nitrite, many bacteria, greater than 50 WBCs.  Patient complaining of dysuria. ?--Urine culture: Pending ?--Ceftriaxone 1 g IV every 24 hours ? ?DVT in Left peroneal vein ?Vascular duplex ultrasound 12/06/2021 with age-indeterminate DVT left peroneal veins. ?-- Eliquis changed to Lovenox 80 mg [place] BID per oncology recommendations ? ?Type 2 diabetes mellitus with hyperglycemia (HCC) ?Home regimen includes Toujeo 25 units daily and metformin XR 750 mg p.o. daily.  ?--Hemoglobin A1c: 8.0 ?--Holding oral hypoglycemics while inpatient ?--Semglee 16u [place] daily ?--sensitive SSI for coverage ?--CBGs qAC/HS ? ?Cancer with unknown primary site [facility]) ?Follows with medical oncology, [name].  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  Has planned restaging evaluation in 3 weeks to decide on resuming systemic therapy.  Further per oncology. ? ?Interatrial cardiac shunt ?TTE 09/17/2021 notable for positive shunting with agitated saline suggestive of interatrial shunt.  Transcranial Doppler 09/20/2021 positive for medium size right to left shunt. ? ?BPH (benign prostatic hyperplasia) ?--Tamsulosin 0.4 mg p.o. daily (substituted for Rapaflo) ? ? ? ? ?DVT prophylaxis:   Lovenox ? ?  Code Status: Full Code ?Family Communication: No family present at bedside this morning ? ?Disposition Plan:  ?Level of care: Telemetry Medical ?Status is: Inpatient ?Remains inpatient appropriate because: Pending CIR ?  ? ?Consultants:  ?Neurology ?Medical oncology, [name] ? ?Procedures:  ?None ? ?Antimicrobials:  ?Ceftriaxone 3/13>> ? ? ? ?Subjective: ?Patient seen examined bedside, resting comfortably.  Continues with left-sided weakness; LUE>LLE.  Awaiting CIR evaluation.  No other  specific questions or concerns at this time.  Denies headache, no visual changes, no chest pain, palpitations, no shortness of breath, no abdominal pain, no fatigue, no nausea/vomiting/diarrhea, no fever/chills/night sweats, no paresthesias.  No acute events overnight per nursing staff. ? ?Objective: ?Vitals:  ? 01/24/22 2325 01/25/22 0343 01/25/22 0732 01/25/22 1119  ?BP: (!) 130/92 (!) 118/91 134/86 120/78  ?Pulse: 70 87 76 84  ?Resp: '18 19 20 16  '$ ?Temp: 98.1 ?F (36.7 ?C) 98.4 ?F (36.9 ?C) 98.1 ?F (36.7 ?C) 97.6 ?F (36.4 ?C)  ?TempSrc: Oral Oral Oral Oral  ?SpO2: 100% 99% 100% 100%  ?Weight:      ?Height:      ? ? ?Intake/Output Summary (Last 24 hours) at 01/25/2022 1157 ?Last data filed at 01/25/2022 7026 ?Gross per 24 hour  ?Intake 1134 ml  ?Output 2125 ml  ?Net -991 ml  ? ?Filed Weights  ? 01/23/22 0654  ?Weight: 83 kg  ? ? ?Examination: ? ?Physical Exam: ?GEN: NAD, alert and oriented x 3, wd/wn ?HEENT: NCAT, PERRL, EOMI, sclera clear, MMM ?PULM: CTAB w/o wheezes/crackles, normal respiratory effort, on room air ?CV: RRR w/o M/G/R ?GI: abd soft, NTND, NABS, no R/G/M ?MSK: no peripheral edema, muscle strength Left UE/LE 4/5, right UE/LE 5/5 ?NEURO: CN II-XII intact, muscles strength discrepancy as above with no other focal deficits appreciated, sensation to light touch intact ?PSYCH: normal mood/affect ?Integumentary: dry/intact, no rashes or wounds ? ? ? ?Data Reviewed: I have personally reviewed following labs and imaging studies ? ?CBC: ?Recent Labs  ?Lab 01/23/22 ?0700  ?WBC 4.7  ?NEUTROABS 2.6  ?HGB 11.9*  ?HCT 37.1*  ?  MCV 89.0  ?PLT 155  ? ?Basic Metabolic Panel: ?Recent Labs  ?Lab 01/23/22 ?0700 01/25/22 ?0122  ?NA 137 135  ?K 4.4 4.1  ?CL 102 102  ?CO2 28 24  ?GLUCOSE 123* 140*  ?BUN 24* 21  ?CREATININE 1.22 1.09  ?CALCIUM 10.0 9.1  ? ?GFR: ?Estimated Creatinine Clearance: 54 mL/min (by C-G formula based on SCr of 1.09 mg/dL). ?Liver Function Tests: ?Recent Labs  ?Lab 01/23/22 ?0700  ?AST 38  ?ALT 33   ?ALKPHOS 130*  ?BILITOT 0.8  ?PROT 7.3  ?ALBUMIN 3.8  ? ?No results for input(s): LIPASE, AMYLASE in the last 168 hours. ?No results for input(s): AMMONIA in the last 168 hours. ?Coagulation Profile: ?Recent Labs  ?

## 2022-01-25 NOTE — Progress Notes (Addendum)
Inpatient Rehabilitation Admissions Coordinator  ? ?I met with patient and his wife at bedside. We discussed goals and expectations of a possible Cir admit. They prefer Cir rather than SNF. Was at [facility] recently for 2 weeks. Wife is requesting that oncology staging tests be done prior to admit to CIR. I will begin insurance approval with [facility] Medicare. ? ?Danne Baxter, RN, MSN ?Rehab Admissions Coordinator ?[phone] ?01/25/2022 11:12 AM ? ?

## 2022-01-25 NOTE — Progress Notes (Signed)
Occupational Therapy Treatment ?Patient Details ?Name: Robert Warner ?MRN: [id number] ?DOB: 1940-12-25 ?Today's Date: 01/25/2022 ? ? ?History of present illness 81 yo male who presented with unsteady gait and left sided weakness. CT head was negative for any acute findings, MRI reveals multiple infarcts in L cereblloar region and L & R cerebral regions. PMH includes multiple CVA,intra-atrial shunt, metastatic adenocarcinoma of liver of unknown primary on chemotherapy, left peroneal vein DVT 11/2021. ?  ?OT comments ? Pt making progress with OT goals. This session, pt was able to focus on maintaining his balance EOB and in standing.  He completed exercises, reach cross body, attending to the L side, and weight shifting to challenge his balance, as well as assist him in recognizing when he is not in midline. Pt then requiring mod A +2 to step pivot to recliner, due to heavy L lean and difficulty maintaining weight on his L side to move his RLE. Continuing to recommend AIR to maximize pt's safety and independence. OT will continue to follow acutely.   ? ?Recommendations for follow up therapy are one component of a multi-disciplinary discharge planning process, led by the attending physician.  Recommendations may be updated based on patient status, additional functional criteria and insurance authorization. ?   ?Follow Up Recommendations ? Acute inpatient rehab (3hours/day)  ?  ?Assistance Recommended at Discharge Frequent or constant Supervision/Assistance  ?Patient can return home with the following ? A lot of help with bathing/dressing/bathroom;Direct supervision/assist for medications management;Direct supervision/assist for financial management;Assistance with feeding;Assistance with cooking/housework;Two people to help with walking and/or transfers ?  ?Equipment Recommendations ? Other (comment) (TBD)  ?  ?Recommendations for Other Services Rehab consult ? ?  ?Precautions / Restrictions Precautions ?Precautions:  Fall ?Precaution Comments: High fall risk, poor safety awareness ?Restrictions ?Weight Bearing Restrictions: No  ? ? ?  ? ?Mobility Bed Mobility ?Overal bed mobility: Needs Assistance ?Bed Mobility: Supine to Sit ?  ?  ?Supine to sit: Min guard ?  ?  ?General bed mobility comments: Min Guard to sit EOB from supine with HOB raised to 25 degrees ?  ? ?Transfers ?Overall transfer level: Needs assistance ?Equipment used: 2 person hand held assist ?Transfers: Sit to/from Stand, Bed to chair/wheelchair/BSC ?Sit to Stand: Mod assist, +2 safety/equipment ?  ?  ?Step pivot transfers: Mod assist, +2 physical assistance, +2 safety/equipment ?  ?  ?General transfer comment: Pt able to power up to standing, pushing from bed surface, requiring mod A to assist with powering up and steadying, as pt heavily leans to the L in standing. When taking steps towards recliner, pt requiring multimodal cues to weight shift and take steps. Limited muscular firing in L hip and knee, especially with static standing. ?  ?  ?Balance Overall balance assessment: Needs assistance ?Sitting-balance support: Feet supported ?Sitting balance-Leahy Scale: Fair ?Sitting balance - Comments: Pt leaning to the R side, able to correct with verbal cues. Has difficulty maintaining balance with multitasking, as pt began leaning backwards with working on LB exercises EOB. ?Postural control: Left lateral lean, Right lateral lean, Posterior lean ?Standing balance support: Single extremity supported, Bilateral upper extremity supported ?Standing balance-Leahy Scale: Poor ?Standing balance comment: Pt heavily leaning to the L, requiring frequent redirection to maintain upright posture. Requiring mod A +2 to maintain upright balance ?  ?  ?  ?  ?  ?  ?  ?  ?  ?  ?  ?   ? ?ADL either performed or assessed with clinical judgement  ? ?  ADL Overall ADL's : Needs assistance/impaired ?  ?  ?  ?  ?  ?  ?  ?  ?  ?  ?  ?  ?Toilet Transfer: Moderate assistance;+2 for physical  assistance;+2 for safety/equipment;Stand-pivot ?Toilet Transfer Details (indicate cue type and reason): Simulated with transfer to chair from bed. ?  ?  ?  ?  ?  ?General ADL Comments: Pt making progressing, continues to have L neglect, weakness, and poor insight to deficits. ?  ? ?Extremity/Trunk Assessment   ?  ?  ?  ?  ?  ? ?Vision   ?  ?  ?Perception   ?  ?Praxis   ?  ? ?Cognition Arousal/Alertness: Awake/alert ?Behavior During Therapy: [facility] for tasks assessed/performed ?Overall Cognitive Status: Impaired/Different from baseline ?Area of Impairment: Safety/judgement, Following commands, Problem solving ?  ?  ?  ?  ?  ?  ?  ?  ?  ?  ?  ?Following Commands: Follows one step commands with increased time ?Safety/Judgement: Decreased awareness of safety, Decreased awareness of deficits ?  ?Problem Solving: Requires verbal cues, Requires tactile cues, Difficulty sequencing ?General Comments: Pt limited by [facility], needs repition with different vocal tones for commands. Overall able to follow commands, continues to have L sided inattention, requiring verbal and tactile cuing for safety and deficit awareness. ?  ?  ?   ?Exercises Exercises: Other exercises ?Other Exercises ?Other Exercises: EOB: reaching crossbody with BUE in all quadrents ?Other Exercises: Weight shifting sitting EOB to maintain balance ? ?  ?Shoulder Instructions   ? ? ?  ?General Comments VSS on RA  ? ? ?Pertinent Vitals/ Pain       Pain Assessment ?Pain Assessment: No/denies pain ? ?Home Living   ?  ?  ?  ?  ?  ?  ?  ?  ?  ?  ?  ?  ?  ?  ?  ?  ?  ?  ? ?  ?Prior Functioning/Environment    ?  ?  ?  ?   ? ?Frequency ? Min 3X/week  ? ? ? ? ?  ?Progress Toward Goals ? ?OT Goals(current goals can now be found in the care plan section) ? Progress towards OT goals: Progressing toward goals ? ?Acute Rehab OT Goals ?Patient Stated Goal: To get stronger ?OT Goal Formulation: With patient ?Time For Goal Achievement: 02/07/22 ?Potential to Achieve Goals: Good ?ADL  Goals ?Pt Will Perform Eating: with set-up;with modified independence;sitting ?Pt Will Perform Grooming: with set-up;sitting;with min guard assist ?Pt Will Perform Upper Body Bathing: sitting;with min guard assist;with set-up ?Pt Will Perform Lower Body Bathing: sit to/from stand;with min guard assist;with set-up ?Pt Will Transfer to Toilet: with min assist;ambulating ?Pt Will Perform Toileting - Clothing Manipulation and hygiene: with set-up;with min guard assist;with min assist;sitting/lateral leans;sit to/from stand ?Pt/caregiver will Perform Home Exercise Program: Left upper extremity;Increased strength;With written HEP provided;With Supervision  ?Plan Discharge plan remains appropriate;Frequency remains appropriate   ? ?Co-evaluation ? ? ? PT/OT/SLP Co-Evaluation/Treatment: Yes ?Reason for Co-Treatment: Complexity of the patient's impairments (multi-system involvement);For patient/therapist safety;To address functional/ADL transfers ?  ?OT goals addressed during session: Strengthening/ROM ?  ? ?  ?AM-PAC OT "6 Clicks" Daily Activity     ?Outcome Measure ? ? Help from another person eating meals?: A Little ?Help from another person taking care of personal grooming?: A Little ?Help from another person toileting, which includes using toliet, bedpan, or urinal?: A Lot ?Help from another person bathing (including washing,  rinsing, drying)?: A Lot ?Help from another person to put on and taking off regular upper body clothing?: A Little ?Help from another person to put on and taking off regular lower body clothing?: A Lot ?6 Click Score: 15 ? ?  ?End of Session   ? ?OT Visit Diagnosis: Unsteadiness on feet (R26.81);Other abnormalities of gait and mobility (R26.89);Ataxia, unspecified (R27.0);Other symptoms and signs involving the nervous system (R29.898);Other symptoms and signs involving cognitive function;Hemiplegia and hemiparesis ?Hemiplegia - Right/Left: Left ?Hemiplegia - dominant/non-dominant:  Non-Dominant ?Hemiplegia - caused by: Cerebral infarction ?  ?Activity Tolerance Patient tolerated treatment well ?  ?Patient Left in chair;with call bell/phone within reach;with chair alarm set ?  ?Nurse Communication [employer]

## 2022-01-25 NOTE — PMR Pre-admission (Signed)
PMR Admission Coordinator Pre-Admission Assessment ? ?Patient: Robert Warner is an 81 y.o., male ?MRN: [id number] ?DOB: 10/24/1941 ?Height: '5\' 10"'$  (177.8 cm) ?Weight: 83 kg ? ?Insurance Information ?HMO: yes    PPO:      PCP:      IPA:      80/20:      OTHER:  ?PRIMARY: [employer]      Policy#: [id number]      Subscriber: pt ?CM Name: Sena      Phone#: [phone] ext [phone]     Fax#: [phone] ?Pre-Cert#: [id number]  f/u with Edwena Felty to same fax in 7 days    Employer:  ?Benefits:  Phone #: [phone]     Name: 3/15 ?Eff. Date: 11/13/21     Deduct: none      Out of Pocket Max: $3400      Life Max: none ?CIR: $295 co pay per day days 1 until 6      SNF: no copay days 1 until 20; $196 co pay per day days 21 until 100 ?Outpatient: $10 to $20 per visit     Co-Pay: visits per medical neccesity ?Home Health: 100%      Co-Pay: visits per medical neccesity ?DME: 80%     Co-Pay: 20% ?Providers: in network ? ?SECONDARY: [employer] for Life      Policy#: [id number] ? ?Financial Counselor:       Phone#:  ? ?The ?Data Collection Information Summary? for patients in Inpatient Rehabilitation Facilities with attached ?Privacy Act [place] Records? was provided and verbally reviewed with: Patient and Family ? ?Emergency Contact Information ?Contact Information   ? ? Name Relation Home Work Mobile  ? Robert Warner Spouse [phone]  [phone]  ? Urban, Naval Daughter   [phone]  ? ?  ? ?Current Medical History  ?Patient Admitting Diagnosis: CVA ? ?History of Present Illness: 81 year old male with history of multiple CVA's, intra-atrial shunt, Klebsiella bacteremia, metastatic adenocarcinoma of liver on unknown primary on chemotherapy, left peroneal vein DVT 1/23 who presented on 01/23/22 with unsteady gait and left sided weakness.  ? ? MRI showed embolic shower with largest at right MCA territory. UA WBC more than 50. CT head and neck no significant vascular stenosis. However right upper lobe lung  nodule increased in size. LE venous doppler no acute DVT but chronic left peroneal DVT. Hgb A1c 8.0. Neurology consulted and felt etiology likely due to hypercoagulable state in the setting of  advanced cancer. Compliant with Eliquis at home. Currently CTA neck showed right upper lobe nodule increased in size concerning for continued malignancy progression. Dr Benay Spice oncologist recommends switch from Eliquis to Lovenox for further stroke prevention. Continue home Lipitor. DM II with metformin and insulin glargine. Rocephin for UTI. Flomax for BPH.  ? ?Oncology with restaging CT scan of the abdomen/pelvis . Noted evidence of mild disease progression per oncology, Dr Benay Spice. Discussed with patient and wife and plan to see patient in outpatient after rehab to discuss salvage systemic treatment with gemcitabine/cisplatin versus comfort measures depending on performance status.  ? ?Complete NIHSS TOTAL: 4 ? ?Patient's medical record from [facility]  has been reviewed by the rehabilitation admission coordinator and physician. ? ?Past Medical History  ?Past Medical History:  ?Diagnosis Date  ? Arthritis   ? Cancer [name])   ? Cataract   ? Diabetes mellitus   ? Hyperlipidemia   ? Stroke [facility])   ? ?Has the patient had major surgery during 100 days prior to admission?  No ? ?Family History   ?family history includes Breast cancer in his paternal aunt; Coronary artery disease in his maternal grandmother, maternal uncle, mother, and paternal aunt; Diabetes in his maternal aunt and maternal uncle; Hypertension in his mother; Pancreatic cancer in his brother; Stroke in his paternal grandmother and sister; Stroke (age of onset: 38) in his father. ? ?Current Medications ? ?Current Facility-Administered Medications:  ?  acetaminophen (TYLENOL) tablet 650 mg, 650 mg, Oral, Q8H PRN, Tu, Ching T, DO, 650 mg at 01/26/22 2125 ?  atorvastatin (LIPITOR) tablet 40 mg, 40 mg, Oral, Daily, Tu, Ching T, DO, 40 mg at 01/27/22 1035 ?   cefTRIAXone (ROCEPHIN) 1 g in sodium chloride 0.9 % 100 mL IVPB, 1 g, Intravenous, Q24H, [place] Eric J, DO, Last Rate: 200 mL/hr at 01/27/22 1030, 1 g at 01/27/22 1030 ?  cholecalciferol (VITAMIN D3) tablet 2,000 Units, 2,000 Units, Oral, Daily, Tu, Ching T, DO, 2,000 Units at 01/27/22 1035 ?  enoxaparin (LOVENOX) injection 120 mg, 120 mg, Subcutaneous, Q24H, Rozann Lesches, RPH, 120 mg at 01/27/22 1052 ?  insulin aspart (novoLOG) injection 0-9 Units, 0-9 Units, Subcutaneous, TID WC, Tu, Ching T, DO, 3 Units at 01/27/22 1243 ?  insulin glargine-yfgn (SEMGLEE) injection 16 Units, 16 Units, Subcutaneous, Daily, [place] Eric J, [place], [place] Units at 01/27/22 1036 ?  latanoprost (XALATAN) 0.005 % ophthalmic solution 1 drop, 1 drop, Both Eyes, QHS, Tu, Ching T, DO, 1 drop at 01/26/22 2119 ?  pantoprazole (PROTONIX) EC tablet 20 mg, 20 mg, Oral, Daily, Tu, Ching T, DO, 20 mg at 01/27/22 1034 ?  polyethylene glycol (MIRALAX / GLYCOLAX) packet 17 g, 17 g, Oral, Daily PRN, Tu, Ching T, DO ?  tamsulosin (FLOMAX) capsule 0.4 mg, 0.4 mg, Oral, QPC supper, Tu, Ching T, DO, 0.4 mg at 01/26/22 1741 ?  traMADol (ULTRAM) tablet 50 mg, 50 mg, Oral, Q6H PRN, Tu, Ching T, DO, 50 mg at 01/27/22 0452 ? ?Patients Current Diet:  ?Diet Order   ? ?       ?  Diet - low sodium heart healthy       ?  ?  Diet Heart Room service appropriate? Yes; Fluid consistency: Thin  Diet effective now       ?  ? ?  ?  ? ?  ? ?Precautions / Restrictions ?Precautions ?Precautions: Fall ?Precaution Comments: High fall risk, poor safety awareness ?Restrictions ?Weight Bearing Restrictions: No  ? ?Has the patient had 2 or more falls or a fall with injury in the past year? No ? ?Prior Activity Level ?Community (5-7x/wk): Mod I using cane pta ? ?Prior Functional Level ?Self Care: Did the patient need help bathing, dressing, using the toilet or eating? Needed some help ? ?Indoor Mobility: Did the patient need assistance with walking from room to room (with or without  device)? Independent ? ?Stairs: Did the patient need assistance with internal or external stairs (with or without device)? Independent ? ?Functional Cognition: Did the patient need help planning regular tasks such as shopping or remembering to take medications? Needed some help ? ?Patient Information ?Are you of Hispanic, Latino/a,or Spanish origin?: A. No, not of Hispanic, Latino/a, or Spanish origin ?What is your race?: B. Black or African American ?Do you need or want an interpreter to communicate with a doctor or health care staff?: 0. No ? ?Patient's Response To:  ?Health Literacy and Transportation ?Is the patient able to respond to health literacy and transportation needs?: Yes ?Health Literacy -  How often do you need to have someone help you when you read instructions, pamphlets, or other written material from your doctor or pharmacy?: Never ?In the past 12 months, has lack of transportation kept you from medical appointments or from getting medications?: No ?In the past 12 months, has lack of transportation kept you from meetings, work, or from getting things needed for daily living?: No ? ?Home Assistive Devices / Equipment ?Home Equipment: Shower seat, Grab bars - toilet, Grab bars - tub/shower, Adaptive equipment, Rolling Walker (2 wheels), Cane - single point ? ?Prior Device Use: Indicate devices/aids used by the patient prior to current illness, exacerbation or injury?  cane ? ?Current Functional Level ?Cognition ? Overall Cognitive Status:  (NT formally) ?Current Attention Level: Sustained ?Orientation Level: Oriented X4 ?Following Commands: Follows one step commands consistently ?Safety/Judgement: Decreased awareness of safety ?General Comments: Required cues to use LUE for functional tasks ?   ?Extremity Assessment ?(includes Sensation/Coordination) ? Upper Extremity Assessment: LUE deficits/detail ?LUE Deficits / Details: 3-/5 MMT shoulder and distal. Ataxia and decreased proprioception. Spilling  various items (spit cup, creamer for coffee) during ADLs. Left hand hit face during AROM testing when cued to touch head after raising arms. Unable to maintain grasp on walker. Pt endorses "no sensation" L

## 2022-01-25 NOTE — Progress Notes (Signed)
Inpatient Diabetes Program Recommendations ? ?AACE/ADA: New Consensus Statement on Inpatient Glycemic Control (2015) ? ?Target Ranges:  Prepandial:   less than 140 mg/dL ?     Peak postprandial:   less than 180 mg/dL (1-2 hours) ?     Critically ill patients:  140 - 180 mg/dL  ? ?Lab Results  ?Component Value Date  ? GLUCAP 86 01/25/2022  ? HGBA1C 8.0 (H) 01/24/2022  ? ? ?Review of Glycemic Control ? Latest Reference Range & Units 01/23/22 21:06 01/24/22 06:05 01/24/22 11:41 01/24/22 16:59 01/24/22 22:32 01/25/22 01:34 01/25/22 06:36  ?Glucose-Capillary 70 - 99 mg/dL 186 (H) 77 168 (H) 77 64 (L) 132 (H) 86  ? ?Diabetes history: DM 2 ?Outpatient Diabetes medications:  ?Toujeo 25 units daily ?Novolog 0-15 units tid with meals ?Metformin XR 750 mg q AM ?Current orders for Inpatient glycemic control:  ?Novolog sensitive tid with meals ?Semglee 20 units daily ?Inpatient Diabetes Program Recommendations:   ?May consider reducing Semglee to 16 units daily. ? ?Thanks,  ? ?Adah Perl, RN, BC-ADM ?Inpatient Diabetes Coordinator ?Pager [phone]  (8a-5p) ? ? ? ?

## 2022-01-25 NOTE — Progress Notes (Signed)
Physical Therapy Treatment ?Patient Details ?Name: Robert Warner ?MRN: [id number] ?DOB: 06-Dec-1940 ?Today's Date: 01/25/2022 ? ? ?History of Present Illness 81 yo male who presented with unsteady gait and left sided weakness. CT head was negative for any acute findings, MRI reveals multiple infarcts in L cereblloar region and L & R cerebral regions. PMH includes multiple CVA,intra-atrial shunt, metastatic adenocarcinoma of liver of unknown primary on chemotherapy, left peroneal vein DVT 11/2021. ? ?  ?PT Comments  ? ? Pt admitted with above diagnosis. Pt progressing with much better sitting balance today than last treatment. Pt responding well to cuing in sitting EOB.  Continues with poor attention to left hemibody. Excellent AIR candidate.  Pt currently with functional limitations due to balance and endurance deficits. Pt will benefit from skilled PT to increase their independence and safety with mobility to allow discharge to the venue listed below.      ?Recommendations for follow up therapy are one component of a multi-disciplinary discharge planning process, led by the attending physician.  Recommendations may be updated based on patient status, additional functional criteria and insurance authorization. ? ?Follow Up Recommendations ? Acute inpatient rehab (3hours/day) ?  ?  ?Assistance Recommended at Discharge Frequent or constant Supervision/Assistance  ?Patient can return home with the following A lot of help with walking and/or transfers;A little help with bathing/dressing/bathroom;Assistance with cooking/housework;Assist for transportation;Assistance with feeding;Help with stairs or ramp for entrance ?  ?Equipment Recommendations ? None recommended by PT (TBD)  ?  ?Recommendations for Other Services Rehab consult ? ? ?  ?Precautions / Restrictions Precautions ?Precautions: Fall ?Precaution Comments: High fall risk, poor safety awareness ?Restrictions ?Weight Bearing Restrictions: No  ?  ? ?Mobility ? Bed  Mobility ?Overal bed mobility: Needs Assistance ?Bed Mobility: Supine to Sit ?  ?  ?Supine to sit: Min guard ?  ?  ?General bed mobility comments: Min Guard to sit EOB from supine with HOB raised to 25 degrees ?  ? ?Transfers ?Overall transfer level: Needs assistance ?Equipment used: 2 person hand held assist ?Transfers: Sit to/from Stand, Bed to chair/wheelchair/BSC ?Sit to Stand: Mod assist, +2 safety/equipment ?  ?Step pivot transfers: Mod assist, +2 physical assistance, +2 safety/equipment ?  ?  ?  ?General transfer comment: Pt able to power up to standing, pushing from bed surface, requiring mod A to assist with powering up and steadying, as pt heavily leans to the L in standing. When taking steps towards recliner, pt requiring multimodal cues to weight shift and take steps. Limited muscular firing in L hip and knee, especially with static standing. ?  ? ?Ambulation/Gait ?  ?  ?  ?  ?  ?  ?  ?  ? ? ?Stairs ?  ?  ?  ?  ?  ? ? ?Wheelchair Mobility ?  ? ?Modified Rankin (Stroke Patients Only) ?  ? ? ?  ?Balance Overall balance assessment: Needs assistance ?Sitting-balance support: Feet supported ?Sitting balance-Leahy Scale: Fair ?Sitting balance - Comments: Pt leaning to the R side, able to correct with verbal cues. Has difficulty maintaining balance with multitasking, as pt began leaning backwards with working on LB exercises EOB. ?Postural control: Left lateral lean, Right lateral lean, Posterior lean ?Standing balance support: Single extremity supported, Bilateral upper extremity supported ?Standing balance-Leahy Scale: Poor ?Standing balance comment: Pt heavily leaning to the L, requiring frequent redirection to maintain upright posture. Requiring mod A +2 to maintain upright balance ?  ?  ?  ?  ?  ?  ?  ?  ?  ?  ?  ?  ? ?  ?  Cognition Arousal/Alertness: Awake/alert ?Behavior During Therapy: [facility] for tasks assessed/performed ?Overall Cognitive Status: Impaired/Different from baseline ?Area of Impairment:  Safety/judgement, Following commands, Problem solving ?  ?  ?  ?  ?  ?  ?  ?  ?  ?  ?  ?Following Commands: Follows one step commands with increased time ?Safety/Judgement: Decreased awareness of safety, Decreased awareness of deficits ?  ?Problem Solving: Requires verbal cues, Requires tactile cues, Difficulty sequencing ?General Comments: Pt limited by [facility], needs repition with different vocal tones for commands. Overall able to follow commands, continues to have L sided inattention, requiring verbal and tactile cuing for safety and deficit awareness. ?  ?  ? ?  ?Exercises Other Exercises ?Other Exercises: EOB: reaching crossbody with BUE in all quadrants ?Other Exercises: Weight shifting sitting EOB to maintain balance ? ?  ?General Comments General comments (skin integrity, edema, etc.): VSS on RA ?  ?  ? ?Pertinent Vitals/Pain Pain Assessment ?Pain Assessment: No/denies pain ?Breathing: normal ?Negative Vocalization: none ?Facial Expression: smiling or inexpressive ?Body Language: relaxed ?Consolability: no need to console ?PAINAD Score: 0  ? ? ?Home Living   ?  ?  ?  ?  ?  ?  ?  ?  ?  ?   ?  ?Prior Function    ?  ?  ?   ? ?PT Goals (current goals can now be found in the care plan section) Acute Rehab PT Goals ?Patient Stated Goal: to go home ?Progress towards PT goals: Progressing toward goals ? ?  ?Frequency ? ? ? Min 4X/week ? ? ? ?  ?PT Plan Current plan remains appropriate  ? ? ?Co-evaluation PT/OT/SLP Co-Evaluation/Treatment: Yes ?Reason for Co-Treatment: Complexity of the patient's impairments (multi-system involvement);For patient/therapist safety ?PT goals addressed during session: Mobility/safety with mobility ?OT goals addressed during session: Strengthening/ROM ?  ? ?  ?AM-PAC PT "6 Clicks" Mobility   ?Outcome Measure ? Help needed turning from your back to your side while in a flat bed without using bedrails?: A Little ?Help needed moving from lying on your back to sitting on the side of a flat bed  without using bedrails?: A Little ?Help needed moving to and from a bed to a chair (including a wheelchair)?: A Lot ?Help needed standing up from a chair using your arms (e.g., wheelchair or bedside chair)?: A Lot ?Help needed to walk in hospital room?: A Lot ?Help needed climbing 3-5 steps with a railing? : Total ?6 Click Score: 13 ? ?  ?End of Session Equipment Utilized During Treatment: Gait belt ?Activity Tolerance: Patient tolerated treatment well ?Patient left: in chair;with call bell/phone within reach;with chair alarm set ?Nurse Communication: Mobility status ?PT Visit Diagnosis: Unsteadiness on feet (R26.81);Other abnormalities of gait and mobility (R26.89);Muscle weakness (generalized) (M62.81);Difficulty in walking, not elsewhere classified (R26.2);Ataxic gait (R26.0) ?  ? ? ?Time: 7106-2694 ?PT Time Calculation (min) (ACUTE ONLY): 25 min ? ?Charges:  $Therapeutic Activity: 8-22 mins          ?          ? ?Velmer Woelfel M,PT ?Acute Rehab Services ?[phone] ?[phone] (pager)  ? ? ?Oksana Deberry F Akeylah Hendel ?01/25/2022, 2:11 PM ? ?

## 2022-01-26 DIAGNOSIS — I639 Cerebral infarction, unspecified: Secondary | ICD-10-CM | POA: Diagnosis not present

## 2022-01-26 LAB — GLUCOSE, CAPILLARY
Glucose-Capillary: 119 mg/dL — ABNORMAL HIGH (ref 70–99)
Glucose-Capillary: 207 mg/dL — ABNORMAL HIGH (ref 70–99)
Glucose-Capillary: 146 mg/dL — ABNORMAL HIGH (ref 70–99)
Glucose-Capillary: 112 mg/dL — ABNORMAL HIGH (ref 70–99)

## 2022-01-26 NOTE — Progress Notes (Signed)
Physical Therapy Treatment ?Patient Details ?Name: Robert Warner ?MRN: [id number] ?DOB: 17-Nov-1940 ?Today's Date: 01/26/2022 ? ? ?History of Present Illness 81 yo male who presented with unsteady gait and left sided weakness. CT head was negative for any acute findings, MRI reveals multiple infarcts in L cereblloar region and L & R cerebral regions. PMH includes multiple CVA,intra-atrial shunt, metastatic adenocarcinoma of liver of unknown primary on chemotherapy, left peroneal vein DVT 11/2021. ? ?  ?PT Comments  ? ? Focus of session today was functional bed mobility, repeated tranfers, and short distance gait trials. The patient tolerated well.  Pt. Shows overall improvement with his functional mobility, L sided strength and control, as well as improved processing. L sided inattention, strength, control, and functional balance are still limiting function. Pt. Would benefit from skilled PT to continue to address his functional, L sided inattention, balance, and gait deficits. Plan and discharge setting remains unchanged. Pt to follow acutely as appropriate.  ?   ?Recommendations for follow up therapy are one component of a multi-disciplinary discharge planning process, led by the attending physician.  Recommendations may be updated based on patient status, additional functional criteria and insurance authorization. ? ?Follow Up Recommendations ? Acute inpatient rehab (3hours/day) ?  ?  ?Assistance Recommended at Discharge Frequent or constant Supervision/Assistance  ?Patient can return home with the following A lot of help with walking and/or transfers;A little help with bathing/dressing/bathroom;Assistance with cooking/housework;Assist for transportation;Assistance with feeding;Help with stairs or ramp for entrance ?  ?Equipment Recommendations ? None recommended by PT  ?  ?Recommendations for Other Services Rehab consult ? ? ?  ?Precautions / Restrictions Precautions ?Precautions: Fall ?Precaution Comments: High  fall risk, poor safety awareness ?Restrictions ?Weight Bearing Restrictions: No  ?  ? ?Mobility ? Bed Mobility ?Overal bed mobility: Needs Assistance ?Bed Mobility: Supine to Sit ?  ?  ?Supine to sit: Min guard, HOB elevated ?  ?  ?General bed mobility comments: Min guard to sit EOB, cues for attention to L side ?  ? ?Transfers ?Overall transfer level: Needs assistance ?Equipment used: 2 person hand held assist, Rolling walker (2 wheels) (Used RW at first, then transitioned to [facility] for less cognitive demand) ?Transfers: Sit to/from Stand ?Sit to Stand: Min assist, +2 safety/equipment ?  ?  ?  ?  ?  ?General transfer comment: STS x3, 2 w/ RW, 1 w/ 2+HHA, significant cuing for use of L UE and placement, as well for controled eccentric portion. Walked through the set up each time, hand placement, foot placement, scoot forward, and nose over toes. ?  ? ?Ambulation/Gait ?Ambulation/Gait assistance: Mod assist, +2 safety/equipment ?Gait Distance (Feet): 40 Feet ?Assistive device: Rolling walker (2 wheels), 2 person hand held assist ?Gait Pattern/deviations: Decreased step length - right, Decreased step length - left, Ataxic, Narrow base of support, Step-through pattern, Decreased stance time - left, Decreased dorsiflexion - left ?Gait velocity: decr ?  ?Pre-gait activities: Standing marching and weight shfits (pt. performs high knees and looses balance towards the L) ?General Gait Details: Started with RW, significant cues for processing and clearance of L foot. Performed 29f ambulation with turn to the R, cues for weight shifting, L LE clearance, and to hold onto the RW. Second trial with 2+ HHA, improved processing with plan explained and then constant cuing throughout. Better L LE clearance with "toes up" cues and improved STS with cues. ? ? ?Stairs ?  ?  ?  ?  ?  ? ? ?Wheelchair Mobility ?  ? ?  Modified Rankin (Stroke Patients Only) ?Modified Rankin (Stroke Patients Only) ?Pre-Morbid Rankin Score: No symptoms ?Modified  Rankin: Moderately severe disability ? ? ?  ?Balance Overall balance assessment: Needs assistance ?Sitting-balance support: Feet supported ?Sitting balance-Leahy Scale: Fair ?Sitting balance - Comments: Pt. able to sit EOB and correct L sided lean. Has to be cued to attend to L UE ?Postural control: Left lateral lean ?Standing balance support: Bilateral upper extremity supported, During functional activity ?Standing balance-Leahy Scale: Poor ?Standing balance comment: Requires RW and 2+ HHA assist to maintain balance during stance. L sided lateral lean that he is able to correct when cued towards it. Cues for proper WB and stance in L LE during functional gait/pre-gait ?  ?  ?  ?  ?  ?  ?  ?  ?  ?  ?  ?  ? ?  ?Cognition Arousal/Alertness: Awake/alert ?Behavior During Therapy: [facility] for tasks assessed/performed ?Overall Cognitive Status: Impaired/Different from baseline ?Area of Impairment: Safety/judgement, Following commands, Problem solving ?  ?  ?  ?  ?  ?  ?  ?  ?  ?  ?  ?Following Commands: Follows one step commands consistently, Follows multi-step commands consistently ?Safety/Judgement: Decreased awareness of safety, Decreased awareness of deficits ?  ?Problem Solving: Requires verbal cues, Requires tactile cues, Difficulty sequencing ?General Comments: Pt. required heavy cuing for all functional mobility. Had to be reminded significantly to attend to L side especially when getting fatigued. Requires cues for sequencing, planning, and throughout each movement. ?  ?  ? ?  ?Exercises   ? ?  ?General Comments   ?  ?  ? ?Pertinent Vitals/Pain Pain Assessment ?Pain Assessment: No/denies pain  ? ? ?Home Living   ?  ?  ?  ?  ?  ?  ?  ?  ?  ?   ?  ?Prior Function    ?  ?  ?   ? ?PT Goals (current goals can now be found in the care plan section) Acute Rehab PT Goals ?Patient Stated Goal: to go home ?PT Goal Formulation: With patient/family ?Time For Goal Achievement: 02/07/22 ?Potential to Achieve Goals: Good ?Progress  towards PT goals: Progressing toward goals ? ?  ?Frequency ? ? ? Min 4X/week ? ? ? ?  ?PT Plan Current plan remains appropriate  ? ? ?Co-evaluation   ?  ?  ?  ?  ? ?  ?AM-PAC PT "6 Clicks" Mobility   ?Outcome Measure ? Help needed turning from your back to your side while in a flat bed without using bedrails?: A Little ?Help needed moving from lying on your back to sitting on the side of a flat bed without using bedrails?: A Little ?Help needed moving to and from a bed to a chair (including a wheelchair)?: A Lot ?Help needed standing up from a chair using your arms (e.g., wheelchair or bedside chair)?: A Lot ?Help needed to walk in hospital room?: A Lot ?Help needed climbing 3-5 steps with a railing? : Total ?6 Click Score: 13 ? ?  ?End of Session Equipment Utilized During Treatment: Gait belt ?Activity Tolerance: Patient tolerated treatment well ?Patient left: in chair;with call bell/phone within reach;with chair alarm set ?Nurse Communication: Mobility status ?PT Visit Diagnosis: Unsteadiness on feet (R26.81);Other abnormalities of gait and mobility (R26.89);Muscle weakness (generalized) (M62.81);Difficulty in walking, not elsewhere classified (R26.2);Ataxic gait (R26.0) ?  ? ? ?Time: 1572-6203 ?PT Time Calculation (min) (ACUTE ONLY): 23 min ? ?Charges:  $Gait Training: 8-22 mins ?$  Neuromuscular Re-education: 8-22 mins          ?          ? ?Thermon Leyland, SPT ?Acute Rehab Services ? ? ? ?Thermon Leyland ?01/26/2022, 1:41 PM ? ?

## 2022-01-26 NOTE — Plan of Care (Signed)
  Problem: Education: Goal: Knowledge of disease or condition will improve Outcome: Progressing Goal: Knowledge of secondary prevention will improve (SELECT ALL) Outcome: Progressing Goal: Knowledge of patient specific risk factors will improve (INDIVIDUALIZE FOR PATIENT) Outcome: Progressing Goal: Individualized Educational Video(s) Outcome: Progressing   

## 2022-01-26 NOTE — Progress Notes (Addendum)
HEMATOLOGY-ONCOLOGY PROGRESS NOTE ? ?ASSESSMENT AND PLAN: ?Poorly differentiated adenocarcinoma involving the liver ?Abdominal ultrasound 08/05/2021-indeterminate 49m solid mass in the right hepatic lobe.   ?MRI of the liver 09/06/2021-multifocal rim-enhancing lesions throughout both lobes of the liver.  Index lesion within the lateral dome of right hepatic lobe measures 1.1 cm, segment 4A lesion measures 1.6 x 1.4 cm, segment 2 lesion measures 0.8 x 0.7 cm, posteromedial margin of the right hepatic lobe subcapsular lesion measures 2.5 x 2.0 cm ?09/16/2021 CT angio neck-9 mm right upper lobe nodule ?Biopsy of a right liver mass on 09/23/2021.  Pathology shows poorly differentiated adenocarcinoma positive for cytokeratin 7, CDX2 and cytokeratin 20 and negative for TTF-1, PSA and prostein.  Differential diagnoses include pancreatobiliary and less likely upper GI. ?HER2 positive by FISH ?PD-L1 combined positive score 0% ?MSS equivocal, tumor mutation burden 0, ERBB2 amplification equivocal ?09/29/2021 CA 19-9 81, CEA 33 ?PET scan 10/10/2021-solitary 10 mm right upper lobe pulmonary nodule with minimal FDG uptake.  7.5 mm left internal mammary lymph node hypermetabolic with SUV max 43.01  Numerous hepatic lesions.  Periportal lymphadenopathy SUV max 8.18.  2 adjacent lesions noted in the mesentery in the right mid abdomen with somewhat irregular margins, SUV 8.09.  No hypermetabolic colonic lesion identified to suggest a primary colon cancer.  No pancreatic lesion.  No gastric lesion.  No retroperitoneal lymphadenopathy. ?Upper endoscopy 10/13/2021-no mass, H. pylori gastritis on gastric biopsy ?Cycle 1 FOLFOX 10/19/2021 ?Cycle 2 FOLFOX 11/02/2021, Emend added, oxaliplatin dose reduced ?Cycle 3 FOLFOX 11/16/2021 ?Cycle 4 FOLFOX 11/30/2021 ?CT abdomen/pelvis with contrast on 12/05/2021-stable exam with bilobar hepatic masses, findings suggestive of metastatic gallbladder cancer. ?Hospital admission 09/16/2021 - 09/19/2021 with  gait disturbance-multifocal embolic stroke on MRI 160/11/930 echo with interatrial shunt, transcranial Doppler with bubble study indicative of a medium size right to left shunt, lower extremity Doppler studies negative for DVT ?Hospital admission 09/20/2021 - 09/22/2021 with chest pain-CT coronary with no acute findings.   ?Diabetes ?Hypertension ?BPH ?Chronic kidney disease ?Brain CT 11/18/2021-new "lesion" in the left cerebellum when compared to brain MRI 09/16/2021.  Appearance of rapid development suspicious for a subacute infarct.  Metastatic focus not excluded.  Follow-up brain MRI with contrast recommended in the next 4 to 6 weeks. ?[facility], Heparin then apixaban anticoagulation ?10.  Klebsiella bacteremia 12/05/2021-no apparent source for infection ?11.  Thrombocytopenia secondary to chemotherapy and bacteremia ?12.  Left peroneal DVT 12/05/2021 ?13.  Hospital admission 32/37/6283-TDVVOembolic stroke ?MRI brain 01/23/2022-multiple, mostly punctate acute infarcts in the right greater than left cerebral hemisphere and left cerebellum with a larger area of infarction in the posterior right frontal lobe, subacute infarct in the left cerebellar hemisphere ?Lower extremity Dopplers 01/23/2022-chronic left peroneal DVT ? ?Dr. BJeneen Rinksappears stable.  He is currently on Lovenox 80 mg twice a day.  Recommend for him to continue twice a day dosing and then transition him to Lovenox 1.5 mg/kg daily prior to discharge. ? ?The patient is awaiting insurance approval for CIR.  Hopefully, they will hear back in the next 24 hours.  If he does not get approved for CIR, will need SNF for rehab. ? ?He had a restaging CT scan of the abdomen/pelvis which was reviewed and discussed with the patient and his wife today.  There is evidence of mild disease progression.  We will plan to see him back in our office following discharge from rehab to discuss systemic treatment with  gemcitabine/cisplatin versus comfort measures. ? ?  Recommendations: ?1.  Recommend transitioning Lovenox from twice daily dosing to Lovenox 1.5 mg/kg/day prior to discharge. ?2.  We will arrange for outpatient follow-up at the cancer center to discuss systemic treatment options. ?4.  Under consideration for CIR versus SNF for rehab.  Waiting to hear back from insurance. ? ?Mikey Bussing, DNP, AGPCNP-BC, AOCNP ? ?[name] was interviewed and examined.  The left-sided weakness appears improved.  He is waiting on approval for an inpatient rehabilitation admission. ?I reviewed the CT findings with him.  I reviewed the images.  There is progressive metastatic disease in the liver.  Changes at the gallbladder fossa suggest he may have a primary gallbladder carcinoma. ? ?He has a hypercoagulation syndrome and is now maintained on Lovenox anticoagulation.  We will convert to once daily dosing at discharge.  We will consider salvage systemic therapy if his performance status continues to improve. ? ?I will follow him while on the rehabilitation service.  Outpatient follow-up will be scheduled at the Cancer center. ? ?I was present for greater than 50% of today's visit.  I performed medical decision making. ? ?Julieanne Manson MD ? ?SUBJECTIVE: Sitting up in the recliner.  No new complaints.  Continues to have left-sided weakness.  Awaiting insurance approval for CIR. ? ?Oncology History  ?Cancer with unknown primary site [facility])  ?10/11/2021 Initial Diagnosis  ? Cancer with unknown primary site [facility]) ?  ?10/19/2021 -  Chemotherapy  ? Patient is on Treatment Plan : COLORECTAL FOLFOX q14d  ?   ? ?PHYSICAL EXAMINATION: ? ?Vitals:  ? 01/26/22 0725 01/26/22 1140  ?BP: 133/86 112/73  ?Pulse: 79 80  ?Resp: 18 18  ?Temp: 98 ?F (36.7 ?C) 98 ?F (36.7 ?C)  ?SpO2: 100% 98%  ? ?Filed Weights  ? 01/23/22 0654  ?Weight: 83 kg  ? ? ?Intake/Output from previous day: ?03/15 0701 - 03/16 0700 ?In: 800 [P.O.:600; IV Piggyback:200] ?Out: 30  [Urine:950; Emesis/NG output:1] ? ?Physical Exam ?Vitals reviewed.  ?Constitutional:   ?   General: He is not in acute distress. ?HENT:  ?   Head: Normocephalic.  ?   Mouth/Throat:  ?   Pharynx: No oropharyngeal exudate or posterior oropharyngeal erythema.  ?Eyes:  ?   General: No scleral icterus. ?Cardiovascular:  ?   Rate and Rhythm: Normal rate.  ?Pulmonary:  ?   Effort: Pulmonary effort is normal. No respiratory distress.  ?Abdominal:  ?   Palpations: Abdomen is soft.  ?Musculoskeletal:  ?   Comments: Strength in the right upper and lower extremities 5/5.  Strength in the left lower extremity 4/5 and strength in the left upper extremity 3/5.  ?Skin: ?   General: Skin is warm and dry.  ?Neurological:  ?   Mental Status: He is alert and oriented to person, place, and time.  ? ? ?LABORATORY DATA:  ?I have reviewed the data as listed ?CMP Latest Ref Rng & Units 01/25/2022 01/23/2022 12/28/2021  ?Glucose 70 - 99 mg/dL 140(H) 123(H) 250(H)  ?BUN 8 - 23 mg/dL 21 24(H) 14  ?Creatinine 0.61 - 1.24 mg/dL 1.09 1.22 1.04  ?Sodium 135 - 145 mmol/L 135 137 136  ?Potassium 3.5 - 5.1 mmol/L 4.1 4.4 4.1  ?Chloride 98 - 111 mmol/L 102 102 101  ?CO2 22 - 32 mmol/L _0 ?Calcium 8.9 - 10.3 mg/dL 9.1 10.0 9.5  ?Total Protein 6.5 - 8.1 g/dL - 7.3 6.8  ?Total Bilirubin 0.3 - 1.2 mg/dL - 0.8 0.8  ?Alkaline Phos 38 - 126  U/L - 130(H) 85  ?AST 15 - 41 U/L - 38 32  ?ALT 0 - 44 U/L - 33 23  ? ? ?Lab Results  ?Component Value Date  ? WBC 4.7 01/23/2022  ? HGB 11.9 (L) 01/23/2022  ? HCT 37.1 (L) 01/23/2022  ? MCV 89.0 01/23/2022  ? PLT 155 01/23/2022  ? NEUTROABS 2.6 01/23/2022  ? ? ?Lab Results  ?Component Value Date  ? CEA 38.62 (H) 12/28/2021  ? ARE614 117 (H) 12/28/2021  ? ? ?CT ANGIO HEAD NECK W WO CM ? ?Result Date: 01/24/2022 ?CLINICAL DATA:  Stroke suspected EXAM: CT ANGIOGRAPHY HEAD AND NECK TECHNIQUE: Multidetector CT imaging of the head and neck was performed using the standard protocol during bolus administration of intravenous  contrast. Multiplanar CT image reconstructions and MIPs were obtained to evaluate the vascular anatomy. Carotid stenosis measurements (when applicable) are obtained utilizing NASCET criteria, using the distal internal carotid

## 2022-01-26 NOTE — Progress Notes (Signed)
?PROGRESS NOTE ? ? ? ?Robert Warner  YQI:[id number] DOB: [birth year] DOA: 01/23/2022 ?PCP: [name], MD  ? ? ?Brief Narrative:  ?Robert Warner is a 81 year old male with past medical history significant for multiple CVAs, intra-atrial shunt, metastatic adenocarcinoma of the liver of unknown primary on chemotherapy, left peroneal vein DVT January 2023 DM2, BPH who initially presented to [facility] on 3/13 with unsteady gait and left-sided weakness.  Patient states woke up from bed at 5 AM attempting to use the bathroom but felt unsteady with ambulation with both right upper/lower extremity weakness.  Patient utilizes a cane at baseline.  Patient also reports dysuria for the last several days.  Denies headache, no blurred vision, no slurred speech or facial droop.  Further denies abdominal pain, no nausea/vomiting, no fever, no chest pain or palpitations. ? ?Patient with history of embolic stroke November 3875 with similar symptoms.  Echocardiogram showing intra atrium right to left shunt.  Was on Plavix and a statin for secondary prevention but had a breakthrough stroke January 2023. Given history of left to right shunt lower extremity venous duplex was ordered which was positive for acute DVT. He was treated with IV heparin and ultimately placed on Eliquis. He was also incidentally found during that hospitalization to have Klebsiella bacteremia on 12/05/2020 and treated initially on IV Rocephin and narrowed to IV cefazolin and then to cefadroxil for a total of 2 weeks. Had outpatient follow up with ID on 01/03/2022 and was released due to no signs of infection.  ? ?In the ED, temperature 98.4 ?F, HR 84, RR 16, BP 151/83, SPO2 97% on room air.  WBC 4.7, hemoglobin 11.9, platelets 155.  Sodium 137, potassium 4.4, chloride 102, CO2 28, glucose 123, BUN 24, creatinine 1.22.  AST 38, ALT 33, total bilirubin 0.8.  COVID-19 PCR negative.  Influenza A/B PCR negative.  EtOH level less than 10.  Urinalysis  with large leukocytes, positive nitrite, many bacteria, greater than 50 WBCs.  CT head without contrast with no acute intracranial abnormalities, chronic small vessel ischemic disease and brain atrophy, remote lacunar infarcts within the bilateral basal ganglia, right thalamus and bilateral cerebral hemispheres.  CT angiogram head/neck with no intracranial large vessel occlusion or significant stenosis, notable interval increase right upper lobe nodule measuring 12 mm, previously 9 mm.  MR brain with and without contrast with multiple mostly punctate acute infarcts in the right greater than left cerebral hemispheres and left cerebellar hemisphere with larger area of infarction in the posterior right frontal lobe suspicion for embolic etiology, area of enhancement left cerebral hemisphere favored to represent a subacute infarct.  Patient was assessed by teleneurology with recommendations of transfer to [facility] for continued work-up and neurology evaluation.   ? ?  ? ? ?Assessment & Plan: ?  ?Assessment and Plan: ?* Acute embolic stroke ([place]) ?Patient presenting to ED with gait abnormality, left-sided weakness.  History of embolic CVA 64/33 with recurrence 1/23.  History of left to right interatrial shunt noted on TTE 11/22.  Currently on aspirin and Eliquis, denies missed doses. MR brain with and without contrast with multiple mostly punctate acute infarcts in the right greater than left cerebral hemispheres and left cerebellar hemisphere with larger area of infarction in the posterior right frontal lobe suspicion for embolic etiology, area of enhancement left cerebral hemisphere favored to represent a subacute infarct.  CTA head/neck with no large vessel occlusion or significant cranial stenosis.  TTE 1/23 with LVEF 50-55%, no  regional wall motion normalities, grade 1 diastolic dysfunction, aortic dilation 39 mm.  LDL 85 ?--Neurology following, appreciate assistance ?--Hemoglobin A1c: 8.0 ?--PT/OT  recommended CIR; pending insurance authorization ?--Eliquis changed to Lovenox 80 mg [place] BID ?--Aspirin 81 mg p.o. daily ?--Atorvastatin 40 mg p.o. daily ? ? ? ?UTI (urinary tract infection) ?Urinalysis with large leukocytes, positive nitrite, many bacteria, greater than 50 WBCs.  Patient complaining of dysuria. ?--Ceftriaxone 1 g IV every 24 hours ? ?DVT in Left peroneal vein ?Vascular duplex ultrasound 12/06/2021 with age-indeterminate DVT left peroneal veins. ?--Eliquis changed to Lovenox 80 mg [place] BID per oncology recommendations ? ?Type 2 diabetes mellitus with hyperglycemia (HCC) ?Home regimen includes Toujeo 25 units daily and metformin XR 750 mg p.o. daily.  ?--Hemoglobin A1c: 8.0 ?--Holding oral hypoglycemics while inpatient ?--Semglee 16u [place] daily ?--sensitive SSI for coverage ?--CBGs qAC/HS ? ?Cancer with unknown primary site [facility]) ?Follows with medical oncology, [name].  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  CT chest/abdomen/pelvis 3/15 with new and enlarging hepatic metastatic lesions, masslike appearance along gallbladder/porta hepatis, right upper lobe nodule increased in size concerning for progression of his metastatic disease.  Further per medical oncology. ? ?Interatrial cardiac shunt ?TTE 09/17/2021 notable for positive shunting with agitated saline suggestive of interatrial shunt.  Transcranial Doppler 09/20/2021 positive for medium size right to left shunt. ? ?BPH (benign prostatic hyperplasia) ?--Tamsulosin 0.4 mg p.o. daily (substituted for Rapaflo) ? ? ? ? ?DVT prophylaxis:   Lovenox ? ?  Code Status: Full Code ?Family Communication: No family present at bedside this morning ? ?Disposition Plan:  ?Level of care: Telemetry Medical ?Status is: Inpatient ?Remains inpatient appropriate because: Pending CIR ?  ? ?Consultants:  ?Neurology ?Medical oncology, [name] ? ?Procedures:  ?None ? ?Antimicrobials:  ?Ceftriaxone 3/13>> ? ? ? ?Subjective: ?Patient seen  examined bedside, resting comfortably.  Continues with left-sided weakness; LUE>LLE.  Patient was seen again by medical oncology, [name] this morning with updates regarding his progressive malignancy unfortunately.  No other specific questions or concerns at this time.  Denies headache, no visual changes, no chest pain, palpitations, no shortness of breath, no abdominal pain, no fatigue, no nausea/vomiting/diarrhea, no fever/chills/night sweats, no paresthesias.  No acute events overnight per nursing staff.  Awaiting insurance authorization for CIR. ? ?Objective: ?Vitals:  ? 01/25/22 2328 01/26/22 0312 01/26/22 0725 01/26/22 1140  ?BP: (!) 149/98 (!) 167/98 133/86 112/73  ?Pulse: 83 83 79 80  ?Resp: '16 16 18 18  '$ ?Temp: 98.2 ?F (36.8 ?C) (!) 97.3 ?F (36.3 ?C) 98 ?F (36.7 ?C) 98 ?F (36.7 ?C)  ?TempSrc: Oral Oral Oral Oral  ?SpO2: 100% 100% 100% 98%  ?Weight:      ?Height:      ? ? ?Intake/Output Summary (Last 24 hours) at 01/26/2022 1157 ?Last data filed at 01/26/2022 7001 ?Gross per 24 hour  ?Intake 560 ml  ?Output 1501 ml  ?Net -941 ml  ? ?Filed Weights  ? 01/23/22 0654  ?Weight: 83 kg  ? ? ?Examination: ? ?Physical Exam: ?GEN: NAD, alert and oriented x 3, wd/wn ?HEENT: NCAT, PERRL, EOMI, sclera clear, MMM ?PULM: CTAB w/o wheezes/crackles, normal respiratory effort, on room air ?CV: RRR w/o M/G/R ?GI: abd soft, NTND, NABS, no R/G/M ?MSK: no peripheral edema, muscle strength Left UE/LE 4/5, right UE/LE 5/5 ?NEURO: CN II-XII intact, muscles strength discrepancy as above with no other focal deficits appreciated, sensation to light touch intact ?PSYCH: normal mood/affect ?Integumentary: dry/intact, no rashes or wounds ? ? ? ?  Data Reviewed: I have personally reviewed following labs and imaging studies ? ?CBC: ?Recent Labs  ?Lab 01/23/22 ?0700  ?WBC 4.7  ?NEUTROABS 2.6  ?HGB 11.9*  ?HCT 37.1*  ?MCV 89.0  ?PLT 155  ? ?Basic Metabolic Panel: ?Recent Labs  ?Lab 01/23/22 ?0700 01/25/22 ?0122  ?NA 137 135  ?K 4.4 4.1  ?CL  102 102  ?CO2 28 24  ?GLUCOSE 123* 140*  ?BUN 24* 21  ?CREATININE 1.22 1.09  ?CALCIUM 10.0 9.1  ? ?GFR: ?Estimated Creatinine Clearance: 54 mL/min (by C-G formula based on SCr of 1.09 mg/dL). ?Liver Function

## 2022-01-26 NOTE — Progress Notes (Signed)
Inpatient Rehabilitation Admissions Coordinator  ? ?I await [employer] approval for a possible CIR admit. I met at bedside with patient and his wife and they are aware. ? ?Danne Baxter, RN, MSN ?Rehab Admissions Coordinator ?[phone] ?01/26/2022 12:49 PM ? ?

## 2022-01-27 ENCOUNTER — Other Ambulatory Visit: Payer: Self-pay

## 2022-01-27 ENCOUNTER — Encounter (HOSPITAL_COMMUNITY): Payer: Self-pay | Admitting: Physical Medicine & Rehabilitation

## 2022-01-27 ENCOUNTER — Inpatient Hospital Stay (HOSPITAL_COMMUNITY)
Admission: RE | Admit: 2022-01-27 | Discharge: 2022-02-09 | DRG: 057 | Disposition: A | Discharge status: Home Health | Payer: Medicare HMO | Source: Intra-hospital | Attending: Physical Medicine & Rehabilitation | Admitting: Physical Medicine & Rehabilitation

## 2022-01-27 DIAGNOSIS — Z6826 Body mass index (BMI) 26.0-26.9, adult: Secondary | ICD-10-CM

## 2022-01-27 DIAGNOSIS — I639 Cerebral infarction, unspecified: Secondary | ICD-10-CM | POA: Diagnosis not present

## 2022-01-27 DIAGNOSIS — Z8249 Family history of ischemic heart disease and other diseases of the circulatory system: Secondary | ICD-10-CM

## 2022-01-27 DIAGNOSIS — E663 Overweight: Secondary | ICD-10-CM | POA: Diagnosis present

## 2022-01-27 DIAGNOSIS — Z8673 Personal history of transient ischemic attack (TIA), and cerebral infarction without residual deficits: Secondary | ICD-10-CM

## 2022-01-27 DIAGNOSIS — I6340 Cerebral infarction due to embolism of unspecified cerebral artery: Secondary | ICD-10-CM | POA: Diagnosis not present

## 2022-01-27 DIAGNOSIS — C787 Secondary malignant neoplasm of liver and intrahepatic bile duct: Secondary | ICD-10-CM | POA: Diagnosis not present

## 2022-01-27 DIAGNOSIS — Z833 Family history of diabetes mellitus: Secondary | ICD-10-CM | POA: Diagnosis not present

## 2022-01-27 DIAGNOSIS — I69354 Hemiplegia and hemiparesis following cerebral infarction affecting left non-dominant side: Principal | ICD-10-CM

## 2022-01-27 DIAGNOSIS — N390 Urinary tract infection, site not specified: Secondary | ICD-10-CM | POA: Diagnosis present

## 2022-01-27 DIAGNOSIS — I63511 Cerebral infarction due to unspecified occlusion or stenosis of right middle cerebral artery: Secondary | ICD-10-CM | POA: Diagnosis not present

## 2022-01-27 DIAGNOSIS — I63131 Cerebral infarction due to embolism of right carotid artery: Secondary | ICD-10-CM | POA: Diagnosis not present

## 2022-01-27 DIAGNOSIS — Z823 Family history of stroke: Secondary | ICD-10-CM | POA: Diagnosis not present

## 2022-01-27 DIAGNOSIS — C801 Malignant (primary) neoplasm, unspecified: Secondary | ICD-10-CM | POA: Diagnosis present

## 2022-01-27 DIAGNOSIS — Z86718 Personal history of other venous thrombosis and embolism: Secondary | ICD-10-CM | POA: Diagnosis not present

## 2022-01-27 DIAGNOSIS — Z79891 Long term (current) use of opiate analgesic: Secondary | ICD-10-CM

## 2022-01-27 DIAGNOSIS — H9193 Unspecified hearing loss, bilateral: Secondary | ICD-10-CM | POA: Diagnosis not present

## 2022-01-27 DIAGNOSIS — D6859 Other primary thrombophilia: Secondary | ICD-10-CM | POA: Diagnosis not present

## 2022-01-27 DIAGNOSIS — Z87891 Personal history of nicotine dependence: Secondary | ICD-10-CM

## 2022-01-27 DIAGNOSIS — E1165 Type 2 diabetes mellitus with hyperglycemia: Secondary | ICD-10-CM | POA: Diagnosis not present

## 2022-01-27 DIAGNOSIS — Z713 Dietary counseling and surveillance: Secondary | ICD-10-CM | POA: Diagnosis not present

## 2022-01-27 DIAGNOSIS — Z794 Long term (current) use of insulin: Secondary | ICD-10-CM | POA: Diagnosis not present

## 2022-01-27 DIAGNOSIS — E669 Obesity, unspecified: Secondary | ICD-10-CM | POA: Diagnosis not present

## 2022-01-27 DIAGNOSIS — E785 Hyperlipidemia, unspecified: Secondary | ICD-10-CM | POA: Diagnosis present

## 2022-01-27 DIAGNOSIS — Z7984 Long term (current) use of oral hypoglycemic drugs: Secondary | ICD-10-CM

## 2022-01-27 DIAGNOSIS — I63411 Cerebral infarction due to embolism of right middle cerebral artery: Secondary | ICD-10-CM | POA: Diagnosis not present

## 2022-01-27 DIAGNOSIS — I69312 Visuospatial deficit and spatial neglect following cerebral infarction: Secondary | ICD-10-CM

## 2022-01-27 DIAGNOSIS — M1990 Unspecified osteoarthritis, unspecified site: Secondary | ICD-10-CM | POA: Diagnosis not present

## 2022-01-27 DIAGNOSIS — N400 Enlarged prostate without lower urinary tract symptoms: Secondary | ICD-10-CM | POA: Diagnosis present

## 2022-01-27 DIAGNOSIS — E119 Type 2 diabetes mellitus without complications: Secondary | ICD-10-CM | POA: Diagnosis not present

## 2022-01-27 DIAGNOSIS — A499 Bacterial infection, unspecified: Secondary | ICD-10-CM | POA: Diagnosis not present

## 2022-01-27 DIAGNOSIS — E1169 Type 2 diabetes mellitus with other specified complication: Secondary | ICD-10-CM | POA: Diagnosis not present

## 2022-01-27 DIAGNOSIS — Z9221 Personal history of antineoplastic chemotherapy: Secondary | ICD-10-CM

## 2022-01-27 DIAGNOSIS — Z79899 Other long term (current) drug therapy: Secondary | ICD-10-CM | POA: Diagnosis not present

## 2022-01-27 DIAGNOSIS — I63433 Cerebral infarction due to embolism of bilateral posterior cerebral arteries: Secondary | ICD-10-CM

## 2022-01-27 DIAGNOSIS — I63319 Cerebral infarction due to thrombosis of unspecified middle cerebral artery: Secondary | ICD-10-CM | POA: Diagnosis not present

## 2022-01-27 LAB — GLUCOSE, CAPILLARY
Glucose-Capillary: 97 mg/dL (ref 70–99)
Glucose-Capillary: 205 mg/dL — ABNORMAL HIGH (ref 70–99)
Glucose-Capillary: 119 mg/dL — ABNORMAL HIGH (ref 70–99)
Glucose-Capillary: 125 mg/dL — ABNORMAL HIGH (ref 70–99)

## 2022-01-27 MED ORDER — ENOXAPARIN SODIUM 120 MG/0.8ML IJ SOSY
120.0000 mg | PREFILLED_SYRINGE | INTRAMUSCULAR | Status: DC
  Administered 2022-01-28 – 2022-02-09 (×13): 120 mg via SUBCUTANEOUS
  Filled 2022-01-27 (×16): qty 0.8

## 2022-01-27 MED ORDER — INSULIN GLARGINE-YFGN 100 UNIT/ML ~~LOC~~ SOLN
16.0000 [IU] | Freq: Every day | SUBCUTANEOUS | Status: DC
  Administered 2022-01-28 – 2022-02-06 (×10): 16 [IU] via SUBCUTANEOUS
  Filled 2022-01-27 (×11): qty 0.16

## 2022-01-27 MED ORDER — PROCHLORPERAZINE 25 MG RE SUPP: 12.5000 mg | Freq: Four times a day (QID) | RECTAL | Status: DC | PRN

## 2022-01-27 MED ORDER — ENOXAPARIN (LOVENOX) PATIENT EDUCATION KIT
PACK | Freq: Once | Status: AC
Start: 2022-01-28 — End: 2022-01-28
  Filled 2022-01-27: qty 1

## 2022-01-27 MED ORDER — METFORMIN HCL 500 MG PO TABS
500.0000 mg | ORAL_TABLET | Freq: Every day | ORAL | Status: DC
  Administered 2022-01-28 – 2022-02-06 (×10): 500 mg via ORAL
  Filled 2022-01-27 (×10): qty 1

## 2022-01-27 MED ORDER — SODIUM CHLORIDE 0.9 % IV SOLN
1.0000 g | INTRAVENOUS | Status: DC
  Administered 2022-01-28: 1 g via INTRAVENOUS
  Filled 2022-01-27 (×2): qty 10

## 2022-01-27 MED ORDER — ENOXAPARIN SODIUM 120 MG/0.8ML IJ SOSY: 120.0000 mg | PREFILLED_SYRINGE | INTRAMUSCULAR | Status: DC

## 2022-01-27 MED ORDER — SORBITOL 70 % SOLN: 30.0000 mL | Freq: Every day | Status: DC | PRN

## 2022-01-27 MED ORDER — PANTOPRAZOLE SODIUM 20 MG PO TBEC
20.0000 mg | DELAYED_RELEASE_TABLET | Freq: Every day | ORAL | Status: DC
  Administered 2022-01-28 – 2022-02-09 (×13): 20 mg via ORAL
  Filled 2022-01-27 (×13): qty 1

## 2022-01-27 MED ORDER — VITAMIN D 25 MCG (1000 UNIT) PO TABS
2000.0000 [IU] | ORAL_TABLET | Freq: Every day | ORAL | Status: DC
  Administered 2022-01-28 – 2022-02-09 (×13): 2000 [IU] via ORAL
  Filled 2022-01-27 (×13): qty 2

## 2022-01-27 MED ORDER — ENOXAPARIN SODIUM 120 MG/0.8ML IJ SOSY
120.0000 mg | PREFILLED_SYRINGE | INTRAMUSCULAR | Status: DC
  Administered 2022-01-27: 120 mg via SUBCUTANEOUS
  Filled 2022-01-27: qty 0.8

## 2022-01-27 MED ORDER — METHOCARBAMOL 500 MG PO TABS
500.0000 mg | ORAL_TABLET | Freq: Four times a day (QID) | ORAL | Status: DC | PRN
  Filled 2022-01-27: qty 1

## 2022-01-27 MED ORDER — FLEET ENEMA 7-19 GM/118ML RE ENEM: 1.0000 | ENEMA | Freq: Once | RECTAL | Status: DC | PRN

## 2022-01-27 MED ORDER — DIPHENHYDRAMINE HCL 12.5 MG/5ML PO ELIX: 12.5000 mg | ORAL_SOLUTION | Freq: Four times a day (QID) | ORAL | Status: DC | PRN

## 2022-01-27 MED ORDER — PROCHLORPERAZINE EDISYLATE 10 MG/2ML IJ SOLN: 5.0000 mg | Freq: Four times a day (QID) | INTRAMUSCULAR | Status: DC | PRN

## 2022-01-27 MED ORDER — PROCHLORPERAZINE MALEATE 5 MG PO TABS: 5.0000 mg | ORAL_TABLET | Freq: Four times a day (QID) | ORAL | Status: DC | PRN

## 2022-01-27 MED ORDER — TOUJEO MAX SOLOSTAR 300 UNIT/ML ~~LOC~~ SOPN: 16.0000 [IU] | PEN_INJECTOR | Freq: Every day | SUBCUTANEOUS | 1 refills | Status: DC

## 2022-01-27 MED ORDER — ATORVASTATIN CALCIUM 40 MG PO TABS
40.0000 mg | ORAL_TABLET | Freq: Every day | ORAL | Status: DC
  Administered 2022-01-28 – 2022-02-09 (×13): 40 mg via ORAL
  Filled 2022-01-27 (×13): qty 1

## 2022-01-27 MED ORDER — CEFDINIR 300 MG PO CAPS: 300.0000 mg | ORAL_CAPSULE | Freq: Two times a day (BID) | ORAL | 0 refills | Status: DC

## 2022-01-27 MED ORDER — TRAZODONE HCL 50 MG PO TABS: 25.0000 mg | ORAL_TABLET | Freq: Every evening | ORAL | Status: DC | PRN

## 2022-01-27 MED ORDER — LATANOPROST 0.005 % OP SOLN
1.0000 [drp] | Freq: Every day | OPHTHALMIC | Status: DC
Start: 2022-01-27 — End: 2022-02-09
  Administered 2022-01-27 – 2022-02-08 (×13): 1 [drp] via OPHTHALMIC
  Filled 2022-01-27: qty 2.5

## 2022-01-27 MED ORDER — GUAIFENESIN-DM 100-10 MG/5ML PO SYRP: 5.0000 mL | ORAL_SOLUTION | Freq: Four times a day (QID) | ORAL | Status: DC | PRN

## 2022-01-27 MED ORDER — ACETAMINOPHEN 325 MG PO TABS
325.0000 mg | ORAL_TABLET | ORAL | Status: DC | PRN
  Administered 2022-01-31 – 2022-02-08 (×5): 650 mg via ORAL
  Filled 2022-01-27 (×6): qty 2

## 2022-01-27 MED ORDER — POLYETHYLENE GLYCOL 3350 17 G PO PACK: 17.0000 g | PACK | Freq: Every day | ORAL | Status: DC | PRN

## 2022-01-27 MED ORDER — TAMSULOSIN HCL 0.4 MG PO CAPS
0.4000 mg | ORAL_CAPSULE | Freq: Every day | ORAL | Status: DC
  Administered 2022-01-27 – 2022-02-08 (×13): 0.4 mg via ORAL
  Filled 2022-01-27 (×13): qty 1

## 2022-01-27 MED ORDER — TRAMADOL HCL 50 MG PO TABS
50.0000 mg | ORAL_TABLET | Freq: Four times a day (QID) | ORAL | Status: DC | PRN
  Administered 2022-02-04 – 2022-02-06 (×2): 50 mg via ORAL
  Filled 2022-01-27 (×2): qty 1

## 2022-01-27 MED ORDER — INSULIN ASPART 100 UNIT/ML IJ SOLN
0.0000 [IU] | Freq: Three times a day (TID) | INTRAMUSCULAR | Status: DC
  Administered 2022-01-29 – 2022-01-30 (×3): 2 [IU] via SUBCUTANEOUS
  Administered 2022-01-31 – 2022-02-01 (×2): 1 [IU] via SUBCUTANEOUS
  Administered 2022-02-01 – 2022-02-02 (×2): 2 [IU] via SUBCUTANEOUS
  Administered 2022-02-03: 1 [IU] via SUBCUTANEOUS
  Administered 2022-02-04 – 2022-02-06 (×2): 2 [IU] via SUBCUTANEOUS
  Administered 2022-02-07: 1 [IU] via SUBCUTANEOUS

## 2022-01-27 MED ORDER — ALUM & MAG HYDROXIDE-SIMETH 200-200-20 MG/5ML PO SUSP: 30.0000 mL | ORAL | Status: DC | PRN

## 2022-01-27 NOTE — Care Management Important Message (Signed)
Important Message ? ?Patient Details  ?Name: Robert Warner ?MRN: [id number] ?Date of Birth: 1941-11-02 ? ? ?Medicare Important Message Given:  Yes ? ? ? ? ?Shamiah Kahler ?01/27/2022, 2:29 PM ?

## 2022-01-27 NOTE — Progress Notes (Signed)
Inpatient Rehabilitation Admissions Coordinator  ? ?I await insurance determination for a possible CIR admit. ? ?Danne Baxter, RN, MSN ?Rehab Admissions Coordinator ?[phone] ?01/27/2022 12:15 PM ? ?

## 2022-01-27 NOTE — TOC Transition Note (Signed)
Transition of Care (TOC) - CM/SW Discharge Note ? ? ?Patient Details  ?Name: Robert Warner ?MRN: [id number] ?Date of Birth: 01-16-1941 ? ?Transition of Care (TOC) CM/SW Contact:  ?Pollie Friar, RN ?Phone Number: ?01/27/2022, 2:56 PM ? ? ?Clinical Narrative:    ?Patient is discharging to CIR today. CM signing off.  ? ? ?Final next level of care: [place] ?Barriers to Discharge: No Barriers Identified ? ? ?Patient Goals and CMS Choice ?  ?  ?Choice offered to / list presented to : Patient ? ?Discharge Placement ?  ?           ?  ?  ?  ?  ? ?Discharge Plan and Services ?  ?  ?           ?  ?  ?  ?  ?  ?  ?  ?  ?  ?  ? ?Social Determinants of Health (SDOH) Interventions ?  ? ? ?Readmission Risk Interventions ?No flowsheet data found. ? ? ? ? ?

## 2022-01-27 NOTE — H&P (Incomplete)
? ? ?Physical Medicine and Rehabilitation Admission H&P ? ?  ?Chief Complaint  ?Patient presents with  ? Weakness  ?CC: Functional deficits secondary to embolic shower of right MCA territory ? ?HPI: Robert Warner is an 81 year old male who presented to [facility] on 01/23/2022 with an approximately 1 to 2-hour history of left-sided weakness associated with imbalance.  He states he stumbled but did not fall or injure himself.  He was deemed out of the window for tPA.  His history is significant for metastatic adenocarcinoma of the liver with unknown primary and lower extremity DVT maintained on Eliquis.  He also has a history of prior CVA with left right intra-atrial shunt maintained on Plavix.  TeleNeurology consult services notified.  NIHSS = 2.  CT head reveals multiple old strokes involving bilateral basal ganglia and bilateral cerebral hemispheres and right thalamus.  Recommend admission and further stroke work-up.  Patient transferred to [facility] and admitted by hospitalist service.  Neurology consulted and Dr. Rosalin Hawking evaluated the patient on 3/14.  Admission MRI showed embolic shower the largest of the right MCA territory.  CT head and neck no significant vascular stenosis.  Etiology for embolic stroke again likely due to hypercoagulable state in the setting of advanced cancer.  Patient related he has been compliant with Eliquis at home, indicating Eliquis failure at this time.  The patient has stopped chemotherapy due to intolerance and plans to follow-up with [name] with abdominal ultrasound evaluation pending.  Imaging revealed increase in size of right upper lobe lung nodule and concern for continued malignancy progression.  Dr. Erlinda Hong discussed this with [name] and he recommended switching from Eliquis to Lovenox for further stroke prevention.  Continuation of Lipitor 40 mg recommended. The patient requires inpatient medicine and rehabilitation evaluations and  services for ongoing dysfunction secondary to bilateral right greater than left cerebral punctate infarcts and left cerebellar infarct. ? ?LDL is 85 ?Hemoglobin A1c is 8.0 ?Echo LVEF estimated 50 to 55% ? ?Review of Systems  ?Constitutional:  Positive for chills and fever.  ?HENT:  Positive for hearing loss.   ?Eyes:  Negative for blurred vision.  ?Respiratory:  Negative for cough and shortness of breath.   ?Cardiovascular:  Negative for chest pain.  ?Gastrointestinal:  Negative for abdominal pain, constipation, nausea and vomiting.  ?     Last BM yesterday  ?Genitourinary:  Negative for dysuria and hematuria.  ?Neurological:  Positive for focal weakness. Negative for dizziness and headaches.  ?Past Medical History:  ?Diagnosis Date  ? Arthritis   ? Cancer [facility])   ? Cataract   ? Diabetes mellitus   ? Hyperlipidemia   ? Stroke [facility])   ? ?Past Surgical History:  ?Procedure Laterality Date  ? APPENDECTOMY  1968  ? CATARACT EXTRACTION Bilateral 07/2000  ? states 2 weeks ago (first of sept) OD due in Dec   ? COLONOSCOPY  2004,2009  ? Negative, Dr. Sharlett Iles   ? IR IMAGING GUIDED PORT INSERTION  10/14/2021  ? KIDNEY STONE SURGERY  10/2018  ? PROSTATE BIOPSY  2006  ? Dr.Sigmund Tannebaum  ? ?Family History  ?Problem Relation Age of Onset  ? Hypertension Mother   ? Coronary artery disease Mother   ? Stroke Father 47  ? Stroke Sister   ? Pancreatic cancer Brother   ? Diabetes Maternal Aunt   ? Coronary artery disease Maternal Uncle   ?     2 Maternal Uncles   ? Diabetes Maternal Uncle   ?  Breast cancer Paternal Aunt   ? Coronary artery disease Paternal Aunt   ? Coronary artery disease Maternal Grandmother   ? Stroke Paternal Grandmother   ? Heart disease Neg Hx   ? Colon cancer Neg Hx   ? Esophageal cancer Neg Hx   ? Rectal cancer Neg Hx   ? Stomach cancer Neg Hx   ? ?Social History:  reports that he quit smoking about 55 years ago. His smoking use included cigarettes. He has a 5.00 pack-year smoking history. He has never  used smokeless tobacco. He reports that he does not currently use alcohol after a past usage of about 2.0 standard drinks per week. He reports that he does not use drugs. ?Allergies: No Known Allergies ?Medications Prior to Admission  ?Medication Sig Dispense Refill  ? acetaminophen (TYLENOL) 650 MG CR tablet Take 650 mg by mouth every 8 (eight) hours as needed for pain.    ? apixaban (ELIQUIS) 5 MG TABS tablet Take 1 tablet (5 mg total) by mouth 2 (two) times daily. 60 tablet   ? atorvastatin (LIPITOR) 40 MG tablet Take 1 tablet (40 mg total) by mouth daily. 90 tablet 1  ? Cholecalciferol (VITAMIN D3) 2000 UNITS TABS Take 2,000 Units by mouth daily.    ? insulin aspart (NOVOLOG) 100 UNIT/ML injection Inject 0-15 Units into the skin 3 (three) times daily with meals. (Patient taking differently: Inject 0-15 Units into the skin daily as needed for high blood sugar. Take only if sugar is over 200) 10 mL 11  ? lidocaine-prilocaine (EMLA) cream Apply 1 application topically as needed. Apply 1/2 tablespoon to port site 2 hours prior to stick and cover with Press-and-Seal to numb site for access (Patient taking differently: Apply 1 application. topically daily as needed. Apply 1/2 tablespoon to port site 2 hours prior to stick and cover with Press-and-Seal to numb site for access) 30 g 1  ? metFORMIN (GLUCOPHAGE-XR) 750 MG 24 hr tablet TAKE 1 TABLET BY MOUTH EVERY DAY WITH BREAKFAST (Patient taking differently: Take 750 mg by mouth daily with breakfast.) 90 tablet 0  ? ondansetron (ZOFRAN) 8 MG tablet Take 1 tablet (8 mg total) by mouth every 8 (eight) hours as needed for nausea or vomiting. Start 72 hours after each IV chemotherapy administration 30 tablet 1  ? pantoprazole (PROTONIX) 20 MG tablet TAKE 1 TABLET(20 MG) BY MOUTH DAILY (Patient taking differently: Take 20 mg by mouth daily.) 90 tablet 0  ? polyethylene glycol (MIRALAX / GLYCOLAX) 17 g packet Take 34 g by mouth daily as needed. (Patient taking differently:  Take 17 g by mouth daily as needed for mild constipation.) 14 each 0  ? prochlorperazine (COMPAZINE) 10 MG tablet Take 1 tablet (10 mg total) by mouth every 6 (six) hours as needed for nausea. 60 tablet 1  ? silodosin (RAPAFLO) 8 MG CAPS capsule Take 8 mg by mouth at bedtime.    ? traMADol (ULTRAM) 50 MG tablet Take 1 tablet (50 mg total) by mouth every 6 (six) hours as needed for moderate pain. 10 tablet 0  ? TRAVATAN Z 0.004 % SOLN ophthalmic solution Place 1 drop into both eyes at bedtime.    ? [DISCONTINUED] insulin glargine, 2 Unit Dial, (TOUJEO MAX SOLOSTAR) 300 UNIT/ML Solostar Pen Inject 25 Units into the skin daily. 3 mL 1  ? Accu-Chek Softclix Lancets lancets Use to test blood sugar levels once a day. 100 each 12  ? Blood Glucose Monitoring Suppl (ACCU-CHEK AVIVA PLUS) w/Device KIT  Use to check blood sugar once a day 1 kit 0  ? Continuous Blood Gluc Receiver (DEXCOM G6 RECEIVER) DEVI Use to check blood sugar levels as directed. 1 each 0  ? Continuous Blood Gluc Sensor (DEXCOM G6 SENSOR) MISC Use to check blood glucose levels as directed. 3 each 1  ? glucose blood test strip Use as instructed 100 each 12  ? senna-docusate (SENOKOT-S) 8.6-50 MG tablet Take 2 tablets by mouth 2 (two) times daily. Hold for diarrhea (Patient not taking: Reported on 01/23/2022)    ? ? ? ? ?Home: ?Home Living ?Family/patient expects to be discharged to:: Private residence ?Living Arrangements: Spouse/significant other ?Available Help at Discharge: Family, Available 24 hours/day ?Type of Home: House ?Home Access: Stairs to enter ?Entrance Stairs-Number of Steps: garage 6 steps w/ railing; front steps 10 steps b/l railing ?Entrance Stairs-Rails: Can reach both ?Home Layout: Two level, Able to live on main level with bedroom/bathroom ?Alternate Level Stairs-Number of Steps: full flight does not use upstairs ?Alternate Level Stairs-Rails: Can reach both ?Bathroom Shower/Tub: Walk-in shower ?Bathroom Toilet: Handicapped  height ?Bathroom Accessibility: Yes ?Home Equipment: Shower seat, Grab bars - toilet, Grab bars - tub/shower, Adaptive equipment, Rolling Walker (2 wheels), Cane - single point ?Additional Comments: does not use AD ? Lives

## 2022-01-27 NOTE — Progress Notes (Signed)
Inpatient Rehabilitation Admission Medication Review by a Pharmacist ? ?A complete drug regimen review was completed for this patient to identify any potential clinically significant medication issues. ? ?High Risk Drug Classes Is patient taking? Indication by Medication  ?Antipsychotic Yes Nausea - PRN Prochloperazine (Po, IM, Rectal)  ?Anticoagulant Yes DVT & hx CVA -Lovenox SQ  ?Antibiotic Yes, as an intravenous medication UTI - Ceftriaxone IV   ?Opioid Yes Moderate Pain -PRN Tramadol   ?Antiplatelet No   ?Hypoglycemics/insulin Yes DM - Semglee, Metformin, Insulin aspart sliding scale  ?Vasoactive Medication Yes BPH - Flomax (Substitution of patients home Rapaflo)  ?Chemotherapy No   ?Other Yes CVA prevention- Atorvastatin ?Health Maintenance- Vitamin D  ?Glaucoma- Xalatan eye drops (substitution of patients home Travatan Z) ?Insomnia - Trazodone ?GERD - Protonix  ? ? ? ?Type of Medication Issue Identified Description of Issue Recommendation(s)  ?Drug Interaction(s) (clinically significant) ?    ?Duplicate Therapy ?    ?Allergy ?    ?No Medication Administration End Date ?    ?Incorrect Dose ?    ?Additional Drug Therapy Needed ?    ?Significant med changes from prior encounter (inform family/care partners about these prior to discharge). Eliquis changed to Lovenox  ? ?Metformin 750 mg  XR prior to admission and on discharge summary Will need education prior to discharge ? ?Lower dose of Metformin 500 mg order inpatient - specified in inpatient note that dose was changing.  ?Other ? Medications noted in discharge: ? ?Aspirin 81 mg daily ?Cefdinir '300mg'$  BID for 2 days ? Discharge summary from [place]. [place] states to continue Aspirin 81 mg for history of CVA - consider if needs to resume.  ? ?Discharge summary from Dr. [place] states to change to Oral Cefdinir and Rehab H&P states the same but active order is for Ceftriaxone 1g IV every 24 hours for 3 days - please clarify if should be changed to Cefdinir Oral and if  correct number of doses. ? ? ?  ? ? ?Clinically significant medication issues were identified that warrant physician communication and completion of prescribed/recommended actions by midnight of the next day:  Yes ? ?Name of provider notified for urgent issues identified: N/A ? ?Provider Method of Notification: First shift will contact provider to clarify actions  ? ? ?Time spent performing this drug regimen review (minutes):  60 minutes ? ?Sloan Leiter, PharmD ?Clinical Pharmacist ?Please refer to [facility] for [place] numbers ?01/27/2022 7:57 PM ?

## 2022-01-27 NOTE — Progress Notes (Signed)
Occupational Therapy Treatment ?Patient Details ?Name: Robert Warner ?MRN: [id number] ?DOB: 1940/11/30 ?Today's Date: 01/27/2022 ? ? ?History of present illness 81 yo male who presented with unsteady gait and left sided weakness. CT head was negative for any acute findings, MRI reveals multiple infarcts in L cereblloar region and L & R cerebral regions. PMH includes multiple CVA,intra-atrial shunt, metastatic adenocarcinoma of liver of unknown primary on chemotherapy, left peroneal vein DVT 11/2021. ?  ?OT comments ? Patient received in bed and eager to participate with OT. Patient able to get to EOB with min guard and was stand pivot transfer to recliner with HHA and left knee blocked for safety. Patient performed grooming seated in recliner with education on LUE use for functional task with decreased grip and coordination. Patient stood at sink for peri care and mod assist. Patient led in AROM to LUE and putty exercises to increase functional use. Patient making good progress and would benefit from AIR for increased intensity rehab to increase functional independence. Acute OT to continue to follow.   ? ?Recommendations for follow up therapy are one component of a multi-disciplinary discharge planning process, led by the attending physician.  Recommendations may be updated based on patient status, additional functional criteria and insurance authorization. ?   ?Follow Up Recommendations ? Acute inpatient rehab (3hours/day)  ?  ?Assistance Recommended at Discharge Frequent or constant Supervision/Assistance  ?Patient can return home with the following ? A lot of help with bathing/dressing/bathroom;Direct supervision/assist for medications management;Direct supervision/assist for financial management;Assistance with feeding;Assistance with cooking/housework;Two people to help with walking and/or transfers ?  ?Equipment Recommendations ? Other (comment) (TBD)  ?  ?Recommendations for Other Services   ? ?  ?Precautions  / Restrictions Precautions ?Precautions: Fall ?Precaution Comments: High fall risk, poor safety awareness ?Restrictions ?Weight Bearing Restrictions: No  ? ? ?  ? ?Mobility Bed Mobility ?Overal bed mobility: Needs Assistance ?Bed Mobility: Supine to Sit ?  ?  ?Supine to sit: Min guard, HOB elevated ?  ?  ?General bed mobility comments: Min guard to sit EOB, cues for attention to L side ?  ? ?Transfers ?Overall transfer level: Needs assistance ?Equipment used: None ?Transfers: Sit to/from Stand ?Sit to Stand: Mod assist ?Stand pivot transfers: Mod assist ?  ?  ?  ?  ?General transfer comment: performed stand pivot transfer to recliner with hand held on left side and knee blocked for safety ?  ?  ?Balance Overall balance assessment: Needs assistance ?Sitting-balance support: Feet supported ?Sitting balance-Leahy Scale: Fair ?Sitting balance - Comments: able to sit on EOB ?Postural control: Left lateral lean ?Standing balance support: Single extremity supported, During functional activity ?Standing balance-Leahy Scale: Poor ?Standing balance comment: stood at sink for peri care and mod assist for balance and limited standing tolerance ?  ?  ?  ?  ?  ?  ?  ?  ?  ?  ?  ?   ? ?ADL either performed or assessed with clinical judgement  ? ?ADL Overall ADL's : Needs assistance/impaired ?  ?  ?Grooming: Wash/dry hands;Wash/dry face;Oral care;Minimal assistance;Sitting ?Grooming Details (indicate cue type and reason): required assistance to load toothbrush with education on using LUE for assist ?Upper Body Bathing: Minimal assistance;Sitting ?  ?Lower Body Bathing: Moderate assistance;Sit to/from stand ?Lower Body Bathing Details (indicate cue type and reason): cleaned peri area while standing with mod assist and assistance to block left knee ?  ?  ?  ?  ?  ?  ?  ?  ?  ?  ?  ?  General ADL Comments: required cues to attend and look to left. Relunctant to use LUE to assist but able with cueing ?  ? ?Extremity/Trunk Assessment  Upper Extremity Assessment ?LUE Deficits / Details: 3-/5 MMT shoulder and distal. Ataxia and decreased proprioception. Spilling various items (spit cup, creamer for coffee) during ADLs. Left hand hit face during AROM testing when cued to touch head after raising arms. Unable to maintain grasp on walker. Pt endorses "no sensation" LUE. ?LUE Sensation: decreased light touch;decreased proprioception ?LUE Coordination: decreased fine motor;decreased gross motor ?  ?  ?  ?  ?  ? ?Vision   ?Additional Comments: difficulty locating items on the left ?  ?Perception   ?  ?Praxis   ?  ? ?Cognition Arousal/Alertness: Awake/alert ?Behavior During Therapy: [facility] for tasks assessed/performed ?Overall Cognitive Status: Impaired/Different from baseline ?Area of Impairment: Safety/judgement, Following commands, Problem solving ?  ?  ?  ?  ?  ?  ?  ?  ?  ?Current Attention Level: Sustained ?Memory: Decreased short-term memory, Decreased recall of precautions ?Following Commands: Follows one step commands consistently, Follows multi-step commands consistently ?Safety/Judgement: Decreased awareness of safety, Decreased awareness of deficits ?Awareness: Intellectual, Emergent ?Problem Solving: Requires verbal cues, Requires tactile cues, Difficulty sequencing ?General Comments: Required cues to use LUE for functional tasks ?  ?  ?   ?Exercises Exercises: General Upper Extremity, Other exercises ?General Exercises - Upper Extremity ?Shoulder Flexion: AROM, Left, 10 reps ?Shoulder ABduction: AROM, Left, 10 reps, Seated ?Other Exercises ?Other Exercises: provided therapy putty and instructions on exercises for gross grasp and pinch strengthening ? ?  ?Shoulder Instructions   ? ? ?  ?General Comments    ? ? ?Pertinent Vitals/ Pain       Pain Assessment ?Pain Assessment: No/denies pain ? ?Home Living   ?  ?  ?  ?  ?  ?  ?  ?  ?  ?  ?  ?  ?  ?  ?  ?  ?  ?  ? ?  ?Prior Functioning/Environment    ?  ?  ?  ?   ? ?Frequency ? Min 3X/week  ? ? ? ? ?   ?Progress Toward Goals ? ?OT Goals(current goals can now be found in the care plan section) ? Progress towards OT goals: Progressing toward goals ? ?Acute Rehab OT Goals ?Patient Stated Goal: go to rehab ?OT Goal Formulation: With patient ?Time For Goal Achievement: 02/07/22 ?Potential to Achieve Goals: Good ?ADL Goals ?Pt Will Perform Eating: with set-up;with modified independence;sitting ?Pt Will Perform Grooming: with set-up;sitting;with min guard assist ?Pt Will Perform Upper Body Bathing: sitting;with min guard assist;with set-up ?Pt Will Perform Lower Body Bathing: sit to/from stand;with min guard assist;with set-up ?Pt Will Transfer to Toilet: with min assist;ambulating ?Pt Will Perform Toileting - Clothing Manipulation and hygiene: with set-up;with min guard assist;with min assist;sitting/lateral leans;sit to/from stand ?Pt/caregiver will Perform Home Exercise Program: Left upper extremity;Increased strength;With written HEP provided;With Supervision  ?Plan Discharge plan remains appropriate;Frequency remains appropriate   ? ?Co-evaluation ? ? ?   ?  ?  ?  ?  ? ?  ?AM-PAC OT "6 Clicks" Daily Activity     ?Outcome Measure ? ? Help from another person eating meals?: A Little ?Help from another person taking care of personal grooming?: A Little ?Help from another person toileting, which includes using toliet, bedpan, or urinal?: A Lot ?Help from another person bathing (including washing, rinsing, drying)?: A Lot ?Help from another  person to put on and taking off regular upper body clothing?: A Little ?Help from another person to put on and taking off regular lower body clothing?: A Lot ?6 Click Score: 15 ? ?  ?End of Session Equipment Utilized During Treatment: Gait belt ? ?OT Visit Diagnosis: Unsteadiness on feet (R26.81);Other abnormalities of gait and mobility (R26.89);Ataxia, unspecified (R27.0);Other symptoms and signs involving the nervous system (R29.898);Other symptoms and signs involving cognitive  function;Hemiplegia and hemiparesis ?Hemiplegia - Right/Left: Left ?Hemiplegia - dominant/non-dominant: Non-Dominant ?Hemiplegia - caused by: Cerebral infarction ?  ?Activity Tolerance Patient tolerated tre

## 2022-01-27 NOTE — H&P (Signed)
? ? ?Physical Medicine and Rehabilitation Admission H&P ? ?CC: Functional deficits secondary to embolic shower of right MCA territory ? ?HPI: Robert Warner is an 80-year-old male who presented to [facility] on 01/23/2022 with an approximately 1 to 2-hour history of left-sided weakness associated with imbalance.  He states he stumbled but did not fall or injure himself.  He was deemed out of the window for tPA.  His history is significant for metastatic adenocarcinoma of the liver with unknown primary and lower extremity DVT maintained on Eliquis.  He also has a history of prior CVA with left right intra-atrial shunt maintained on Plavix.  TeleNeurology consult services notified.  NIHSS = 2.  CT head reveals multiple old strokes involving bilateral basal ganglia and bilateral cerebral hemispheres and right thalamus.  Recommend admission and further stroke work-up.  Patient transferred to [facility] and admitted by hospitalist service.  Neurology consulted and Dr. Jindong Xu evaluated the patient on 3/14.  Admission MRI showed embolic shower the largest of the right MCA territory.  CT head and neck no significant vascular stenosis.  Etiology for embolic stroke again likely due to hypercoagulable state in the setting of advanced cancer.  Patient related he has been compliant with Eliquis at home, indicating Eliquis failure at this time.  The patient has stopped chemotherapy due to intolerance and plans to follow-up with Dr. Sherrill with abdominal ultrasound evaluation pending.  Imaging revealed increase in size of right upper lobe lung nodule and concern for continued malignancy progression.  Dr. Xu discussed this with Dr. Sherrill and he recommended switching from Eliquis to Lovenox for further stroke prevention.  Continuation of Lipitor 40 mg recommended. The patient requires inpatient medicine and rehabilitation evaluations and services for ongoing dysfunction secondary to bilateral  right greater than left cerebral punctate infarcts and left cerebellar infarct. ? ?LDL is 85 ?Hemoglobin A1c is 8.0 ?Echo LVEF estimated 50 to 55% ? ?Review of Systems  ?Constitutional:  Positive for chills and fever.  ?HENT:  Positive for hearing loss.   ?Eyes:  Negative for blurred vision.  ?Respiratory:  Negative for cough and shortness of breath.   ?Cardiovascular:  Negative for chest pain.  ?Gastrointestinal:  Negative for abdominal pain, constipation, nausea and vomiting.  ?     Last BM yesterday  ?Genitourinary:  Negative for dysuria and hematuria.  ?Neurological:  Positive for focal weakness. Negative for dizziness and headaches.  ?Past Medical History:  ?Diagnosis Date  ? Arthritis   ? Cancer (HCC)   ? Cataract   ? Diabetes mellitus   ? Hyperlipidemia   ? Stroke (HCC)   ? ?Past Surgical History:  ?Procedure Laterality Date  ? APPENDECTOMY  1968  ? CATARACT EXTRACTION Bilateral 07/2000  ? states 2 weeks ago (first of sept) OD due in Dec   ? COLONOSCOPY  2004,2009  ? Negative, Dr. Patterson   ? IR IMAGING GUIDED PORT INSERTION  10/14/2021  ? KIDNEY STONE SURGERY  10/2018  ? PROSTATE BIOPSY  2006  ? Dr.Sigmund Tannebaum  ? ?Family History  ?Problem Relation Age of Onset  ? Hypertension Mother   ? Coronary artery disease Mother   ? Stroke Father 40  ? Stroke Sister   ? Pancreatic cancer Brother   ? Diabetes Maternal Aunt   ? Coronary artery disease Maternal Uncle   ?     2 Maternal Uncles   ? Diabetes Maternal Uncle   ? Breast cancer Paternal Aunt   ? Coronary artery   disease Paternal Aunt   ? Coronary artery disease Maternal Grandmother   ? Stroke Paternal Grandmother   ? Heart disease Neg Hx   ? Colon cancer Neg Hx   ? Esophageal cancer Neg Hx   ? Rectal cancer Neg Hx   ? Stomach cancer Neg Hx   ? ?Social History:  reports that he quit smoking about 55 years ago. His smoking use included cigarettes. He has a 5.00 pack-year smoking history. He has never used smokeless tobacco. He reports that he does not  currently use alcohol after a past usage of about 2.0 standard drinks per week. He reports that he does not use drugs. ?Allergies: No Known Allergies ?Medications Prior to Admission  ?Medication Sig Dispense Refill  ? Accu-Chek Softclix Lancets lancets Use to test blood sugar levels once a day. 100 each 12  ? acetaminophen (TYLENOL) 650 MG CR tablet Take 650 mg by mouth every 8 (eight) hours as needed for pain.    ? atorvastatin (LIPITOR) 40 MG tablet Take 1 tablet (40 mg total) by mouth daily. 90 tablet 1  ? Blood Glucose Monitoring Suppl (ACCU-CHEK AVIVA PLUS) w/Device KIT Use to check blood sugar once a day 1 kit 0  ? [START ON 01/28/2022] cefdinir (OMNICEF) 300 MG capsule Take 1 capsule (300 mg total) by mouth 2 (two) times daily for 2 days. 4 capsule 0  ? Cholecalciferol (VITAMIN D3) 2000 UNITS TABS Take 2,000 Units by mouth daily.    ? Continuous Blood Gluc Receiver (DEXCOM G6 RECEIVER) DEVI Use to check blood sugar levels as directed. 1 each 0  ? Continuous Blood Gluc Sensor (DEXCOM G6 SENSOR) MISC Use to check blood glucose levels as directed. 3 each 1  ? [START ON 01/28/2022] enoxaparin (LOVENOX) 120 MG/0.8ML injection Inject 0.8 mLs (120 mg total) into the skin daily. 0 mL   ? glucose blood test strip Use as instructed 100 each 12  ? insulin aspart (NOVOLOG) 100 UNIT/ML injection Inject 0-15 Units into the skin 3 (three) times daily with meals. (Patient taking differently: Inject 0-15 Units into the skin daily as needed for high blood sugar. Take only if sugar is over 200) 10 mL 11  ? insulin glargine, 2 Unit Dial, (TOUJEO MAX SOLOSTAR) 300 UNIT/ML Solostar Pen Inject 16 Units into the skin daily. 3 mL 1  ? lidocaine-prilocaine (EMLA) cream Apply 1 application topically as needed. Apply 1/2 tablespoon to port site 2 hours prior to stick and cover with Press-and-Seal to numb site for access (Patient taking differently: Apply 1 application. topically daily as needed. Apply 1/2 tablespoon to port site 2 hours  prior to stick and cover with Press-and-Seal to numb site for access) 30 g 1  ? metFORMIN (GLUCOPHAGE-XR) 750 MG 24 hr tablet TAKE 1 TABLET BY MOUTH EVERY DAY WITH BREAKFAST (Patient taking differently: Take 750 mg by mouth daily with breakfast.) 90 tablet 0  ? ondansetron (ZOFRAN) 8 MG tablet Take 1 tablet (8 mg total) by mouth every 8 (eight) hours as needed for nausea or vomiting. Start 72 hours after each IV chemotherapy administration 30 tablet 1  ? pantoprazole (PROTONIX) 20 MG tablet TAKE 1 TABLET(20 MG) BY MOUTH DAILY (Patient taking differently: Take 20 mg by mouth daily.) 90 tablet 0  ? polyethylene glycol (MIRALAX / GLYCOLAX) 17 g packet Take 34 g by mouth daily as needed. (Patient taking differently: Take 17 g by mouth daily as needed for mild constipation.) 14 each 0  ? prochlorperazine (COMPAZINE) 10 MG   tablet Take 1 tablet (10 mg total) by mouth every 6 (six) hours as needed for nausea. 60 tablet 1  ? senna-docusate (SENOKOT-S) 8.6-50 MG tablet Take 2 tablets by mouth 2 (two) times daily. Hold for diarrhea (Patient not taking: Reported on 01/23/2022)    ? silodosin (RAPAFLO) 8 MG CAPS capsule Take 8 mg by mouth at bedtime.    ? traMADol (ULTRAM) 50 MG tablet Take 1 tablet (50 mg total) by mouth every 6 (six) hours as needed for moderate pain. 10 tablet 0  ? TRAVATAN Z 0.004 % SOLN ophthalmic solution Place 1 drop into both eyes at bedtime.    ? ? ? ?Home: ?Home Living ?Family/patient expects to be discharged to:: Private residence ?Living Arrangements: Spouse/significant other ?Available Help at Discharge: Family, Available 24 hours/day ?Type of Home: House ?Home Access: Stairs to enter ?Entrance Stairs-Number of Steps: garage 6 steps w/ railing; front steps 10 steps b/l railing ?Entrance Stairs-Rails: Can reach both ?Home Layout: Two level, Able to live on main level with bedroom/bathroom ?Alternate Level Stairs-Number of Steps: full flight does not use upstairs ?Alternate Level Stairs-Rails: Can  reach both ?Bathroom Shower/Tub: Walk-in shower ?Bathroom Toilet: Handicapped height ?Bathroom Accessibility: Yes ?Home Equipment: Shower seat, Grab bars - toilet, Grab bars - tub/shower, Adaptive equipment, Rolling

## 2022-01-27 NOTE — Discharge Summary (Signed)
?Physician Discharge Summary  ?Robert Warner HUT:[id number] DOB: [birth year] DOA: 01/23/2022 ? ?PCP: Robert Post, MD ? ?Admit date: 01/23/2022 ?Discharge date: 01/27/2022 ? ?Admitted From: Home ?Disposition: CIR ? ?Recommendations for Outpatient Follow-up:  ?Follow up with PCP in 1-2 weeks ?Follow-up with neurology 4 weeks ?Follow-up with medical oncology, Robert Warner outpatient for consideration of salvage systemic chemotherapy ?Eliquis discontinued in favor of Lovenox 120 mg [place] q24h ?Continue cefdinir for an additional 2 days for treatment of UTI (7 day course) ?Would benefit from further goals of care discussion outpatient given his progressive malignancy ? ? ?Discharge Condition: Stable ?CODE STATUS: Full code ?Diet recommendation: Heart healthy/consistent carb regular diet ? ?History of present illness: ? ?Robert Warner is a 81 year old male with past medical history significant for multiple CVAs, intra-atrial shunt, metastatic adenocarcinoma of the liver of unknown primary on chemotherapy, left peroneal vein DVT January 2023 DM2, BPH who initially presented to [place] on 3/13 with unsteady gait and left-sided weakness.  Patient states woke up from bed at 5 AM attempting to use the bathroom but felt unsteady with ambulation with both right upper/lower extremity weakness.  Patient utilizes a cane at baseline.  Patient also reports dysuria for the last several days.  Denies headache, no blurred vision, no slurred speech or facial droop.  Further denies abdominal pain, no nausea/vomiting, no fever, no chest pain or palpitations. ? ?Patient with history of embolic stroke November 6568 with similar symptoms.  Echocardiogram showing intra atrium right to left shunt.  Was on Plavix and a statin for secondary prevention but had a breakthrough stroke January 2023. Given history of left to right shunt lower extremity venous duplex was ordered which was positive for acute DVT. He was treated with IV  heparin and ultimately placed on Eliquis. He was also incidentally found during that hospitalization to have Klebsiella bacteremia on 12/05/2020 and treated initially on IV Rocephin and narrowed to IV cefazolin and then to cefadroxil for a total of 2 weeks. Had outpatient follow up with ID on 01/03/2022 and was released due to no signs of infection.  ? ?In the ED, temperature 98.4 ?F, HR 84, RR 16, BP 151/83, SPO2 97% on room air.  WBC 4.7, hemoglobin 11.9, platelets 155.  Sodium 137, potassium 4.4, chloride 102, CO2 28, glucose 123, BUN 24, creatinine 1.22.  AST 38, ALT 33, total bilirubin 0.8.  COVID-19 PCR negative.  Influenza A/B PCR negative.  EtOH level less than 10.  Urinalysis with large leukocytes, positive nitrite, many bacteria, greater than 50 WBCs.  CT head without contrast with no acute intracranial abnormalities, chronic small vessel ischemic disease and brain atrophy, remote lacunar infarcts within the bilateral basal ganglia, right thalamus and bilateral cerebral hemispheres.  CT angiogram head/neck with no intracranial large vessel occlusion or significant stenosis, notable interval increase right upper lobe nodule measuring 12 mm, previously 9 mm.  MR brain with and without contrast with multiple mostly punctate acute infarcts in the right greater than left cerebral hemispheres and left cerebellar hemisphere with larger area of infarction in the posterior right frontal lobe suspicion for embolic etiology, area of enhancement left cerebral hemisphere favored to represent a subacute infarct.  Patient was assessed by teleneurology with recommendations of transfer to [facility] for continued work-up and neurology evaluation.   ? ? ? ?Hospital course: ? ?Assessment and Plan: ?* Acute embolic stroke ([place]) ?Patient presenting to ED with gait abnormality, left-sided weakness.  History of embolic CVA 12/75 with recurrence  1/23.  History of left to right interatrial shunt noted on TTE 11/22.   Currently on aspirin and Eliquis, denies missed doses. MR brain with and without contrast with multiple mostly punctate acute infarcts in the right greater than left cerebral hemispheres and left cerebellar hemisphere with larger area of infarction in the posterior right frontal lobe suspicion for embolic etiology, area of enhancement left cerebral hemisphere favored to represent a subacute infarct.  CTA head/neck with no large vessel occlusion or significant cranial stenosis.  TTE 1/23 with LVEF 50-55%, no regional wall motion normalities, grade 1 diastolic dysfunction, aortic dilation 39 mm.  LDL 85.  Hemoglobin A1c 8.0.  Neurology was consulted and followed during hospital course.  Eliquis was discontinued in favor of Lovenox 120 mg subcutaneously daily per recommendations of hematology/oncology.  We will continue aspirin 81 mg p.o. daily.  Continue atorvastatin 40 mg p.o. daily.  Outpatient follow-up with neurology month.  Discharging to [facility] inpatient rehabilitation. ? ? ? ?UTI (urinary tract infection) ?Urinalysis with large leukocytes, positive nitrite, many bacteria, greater than 50 WBCs.  Patient complaining of dysuria.  Urine culture was not obtained prior to initiation of antibiotics.  Patient was treated with 5 days IV ceftriaxone and will continue 2 additional days of cefdinir to complete 7-day course on discharge. ? ?DVT in Left peroneal vein ?Vascular duplex ultrasound 12/06/2021 with age-indeterminate DVT left peroneal veins. Eliquis changed to Lovenox 120 mg [place] q24h per oncology recommendations ? ?Type 2 diabetes mellitus with hyperglycemia (HCC) ?Home regimen includes Toujeo 25 units daily and metformin XR 750 mg p.o. daily.  Hemoglobin A1c 8.0.  Long-acting insulin was decreased to 16 units [place] daily while inpatient, will continue that dose.  May resume metformin.  Continue to monitor glucose closely and titrate as needed. ? ?Cancer with unknown primary site [facility]) ?Follows with medical oncology, Dr.  Benay Warner.  Poorly differentiated adenocarcinoma to the liver status Warner chemo.  CT chest/abdomen/pelvis 3/15 with new and enlarging hepatic metastatic lesions, masslike appearance along gallbladder/porta hepatis, right upper lobe nodule increased in size concerning for progression of his metastatic disease.  Medical oncology plans outpatient follow-up consider salvage systemic chemotherapy. ? ?Interatrial cardiac shunt ?TTE 09/17/2021 notable for positive shunting with agitated saline suggestive of interatrial shunt.  Transcranial Doppler 09/20/2021 positive for medium size right to left shunt. ? ?BPH (benign prostatic hyperplasia) ?Continue Rapaflo ? ? ? ? ? ? ?Discharge Diagnoses:  ?Principal Problem: ?  Acute embolic stroke ([place]) ?Active Problems: ?  UTI (urinary tract infection) ?  DVT in Left peroneal vein ?  Type 2 diabetes mellitus with hyperglycemia (HCC) ?  Interatrial cardiac shunt ?  Cancer with unknown primary site [facility]) ?  BPH (benign prostatic hyperplasia) ? ? ? ?Discharge Instructions ? ?Discharge Instructions   ? ? Ambulatory referral to Neurology   Complete by: As directed ?  ? Follow up with Dr. Krista Blue at [facility] in 4-6 weeks. Pt is Dr. Rhea Belton pt. Thanks.  ? Diet - low sodium heart healthy   Complete by: As directed ?  ? Increase activity slowly   Complete by: As directed ?  ? ?  ? ?Allergies as of 01/27/2022   ?No Known Allergies ?  ? ?  ?Medication List  ?  ? ?STOP taking these medications   ? ?apixaban 5 MG Tabs tablet ?Commonly known as: ELIQUIS ?  ? ?  ? ?TAKE these medications   ? ?Accu-Chek Aviva Plus w/Device Kit ?Use to check blood sugar once a day ?  ?  Accu-Chek Softclix Lancets lancets ?Use to test blood sugar levels once a day. ?  ?acetaminophen 650 MG CR tablet ?Commonly known as: TYLENOL ?Take 650 mg by mouth every 8 (eight) hours as needed for pain. ?  ?atorvastatin 40 MG tablet ?Commonly known as: LIPITOR ?Take 1 tablet (40 mg total) by mouth daily. ?  ?cefdinir 300 MG capsule ?Commonly known  as: OMNICEF ?Take 1 capsule (300 mg total) by mouth 2 (two) times daily for 2 days. ?Start taking on: January 28, 2022 ?  ?Dexcom G6 Receiver Devi ?Use to check blood sugar levels as directed. ?  ?Dexcom G6 Einar Pheasant

## 2022-01-27 NOTE — Progress Notes (Signed)
INPATIENT REHABILITATION ADMISSION NOTE ? ? ?Arrival Method: bed  ? ?   ?Mental Orientation:alert ? ? ?Assessment:done ? ? ?Skin:done with Neoma Laming RN ? ? ?IV'S:rt f/a ? ? ?Pain:none ? ? ?Tubes and Drains: male purewick;  ? ? ?Safety Measures:done ? ? ?Vital Signs:done ? ? ?Height and Weight:done ? ? ?Rehab Orientation:done ? ? ?Family:wife ? ? ?Devaun Hernandez RNC,BSN, WTA  ?

## 2022-01-27 NOTE — Progress Notes (Signed)
ANTICOAGULATION CONSULT NOTE - Follow Up Consult ? ?Pharmacy Consult for Lovenox ?Indication: DVT / new CVA with Apixaban ? ?No Known Allergies ? ?Patient Measurements: ?Height: '5\' 10"'$  (177.8 cm) ?Weight: 83 kg (182 lb 15.7 oz) ?IBW/kg (Calculated) : 73 ? ? ?Vital Signs: ?Temp: 98.3 ?F (36.8 ?C) (03/17 0321) ?Temp Source: Oral (03/17 0321) ?BP: 124/85 (03/17 0321) ?Pulse Rate: 83 (03/17 0321) ? ?Labs: ?Recent Labs  ?  01/25/22 ?0122  ?CREATININE 1.09  ? ? ? ?Estimated Creatinine Clearance: 54 mL/min (by C-G formula based on SCr of 1.09 mg/dL). ? ?Assessment: ?On apixaban prior to admission for DVT history, also with liver cancer.  Switching to Lovenox with new CVA diagnosis -> likely long term, will switch to Q 24 hour dosing as oncology suggested ? ?CBC stable ? ?Goal of Therapy:  ?Anti-Xa level 0.6-1 units/ml 4hrs after LMWH dose given ?Monitor platelets by anticoagulation protocol: Yes ?  ?Plan:  ?Lovenox to 120 mg sq Q 24 hours ?Follow CBC ? ?Thank you ?Anette Guarneri, PharmD ? ? ?01/27/2022,8:47 AM ? ? ?

## 2022-01-27 NOTE — Progress Notes (Signed)
HEMATOLOGY-ONCOLOGY PROGRESS NOTE ? ?ASSESSMENT AND PLAN: ?Poorly differentiated adenocarcinoma involving the liver ?Abdominal ultrasound 08/05/2021-indeterminate 73m solid mass in the right hepatic lobe.   ?MRI of the liver 09/06/2021-multifocal rim-enhancing lesions throughout both lobes of the liver.  Index lesion within the lateral dome of right hepatic lobe measures 1.1 cm, segment 4A lesion measures 1.6 x 1.4 cm, segment 2 lesion measures 0.8 x 0.7 cm, posteromedial margin of the right hepatic lobe subcapsular lesion measures 2.5 x 2.0 cm ?09/16/2021 CT angio neck-9 mm right upper lobe nodule ?Biopsy of a right liver mass on 09/23/2021.  Pathology shows poorly differentiated adenocarcinoma positive for cytokeratin 7, CDX2 and cytokeratin 20 and negative for TTF-1, PSA and prostein.  Differential diagnoses include pancreatobiliary and less likely upper GI. ?HER2 positive by FISH ?PD-L1 combined positive score 0% ?MSS equivocal, tumor mutation burden 0, ERBB2 amplification equivocal ?09/29/2021 CA 19-9 81, CEA 33 ?PET scan 10/10/2021-solitary 10 mm right upper lobe pulmonary nodule with minimal FDG uptake.  7.5 mm left internal mammary lymph node hypermetabolic with SUV max 43.42  Numerous hepatic lesions.  Periportal lymphadenopathy SUV max 8.18.  2 adjacent lesions noted in the mesentery in the right mid abdomen with somewhat irregular margins, SUV 8.09.  No hypermetabolic colonic lesion identified to suggest a primary colon cancer.  No pancreatic lesion.  No gastric lesion.  No retroperitoneal lymphadenopathy. ?Upper endoscopy 10/13/2021-no mass, H. pylori gastritis on gastric biopsy ?Cycle 1 FOLFOX 10/19/2021 ?Cycle 2 FOLFOX 11/02/2021, Emend added, oxaliplatin dose reduced ?Cycle 3 FOLFOX 11/16/2021 ?Cycle 4 FOLFOX 11/30/2021 ?CT abdomen/pelvis with contrast on 12/05/2021-stable exam with bilobar hepatic masses, findings suggestive of metastatic gallbladder cancer. ?Hospital admission 09/16/2021 - 09/19/2021 with  gait disturbance-multifocal embolic stroke on MRI 187/04/8114 echo with interatrial shunt, transcranial Doppler with bubble study indicative of a medium size right to left shunt, lower extremity Doppler studies negative for DVT ?Hospital admission 09/20/2021 - 09/22/2021 with chest pain-CT coronary with no acute findings.   ?Diabetes ?Hypertension ?BPH ?Chronic kidney disease ?Brain CT 11/18/2021-new "lesion" in the left cerebellum when compared to brain MRI 09/16/2021.  Appearance of rapid development suspicious for a subacute infarct.  Metastatic focus not excluded.  Follow-up brain MRI with contrast recommended in the next 4 to 6 weeks. ?[facility], Heparin then apixaban anticoagulation ?10.  Klebsiella bacteremia 12/05/2021-no apparent source for infection ?11.  Thrombocytopenia secondary to chemotherapy and bacteremia ?12.  Left peroneal DVT 12/05/2021 ?13.  Hospital admission 34/53/6468-EHOZYembolic stroke ?MRI brain 01/23/2022-multiple, mostly punctate acute infarcts in the right greater than left cerebral hemisphere and left cerebellum with a larger area of infarction in the posterior right frontal lobe, subacute infarct in the left cerebellar hemisphere ?Lower extremity Dopplers 01/23/2022-chronic left peroneal DVT ? ?Dr. BJeneen Rinksappears stable.  He is currently on Lovenox 80 mg twice a day.  Recommend for him to continue twice a day dosing and then transition him to Lovenox 1.5 mg/kg daily prior to discharge. ? ?He continues to wait on insurance approval for inpatient rehabilitation. ? ?I discussed treatment of the cancer with Dr. BJeneen Rinks  The plan is to continue observation/supportive care.  He would like to consider salvage systemic chemotherapy if his performance status improves. ?I will follow him while on the inpatient rehabilitation service.  Outpatient follow-up will be scheduled at the Cancer center. ? ?Recommendations: ?1.  Continue Lovenox. ?2.  We will  arrange for outpatient follow-up at the cancer center to discuss systemic treatment options. ?4.  Please  call oncology as needed, I will check on him next week if he is admitted to the rehabilitation service ? ? ? ?Julieanne Manson MD ? ?SUBJECTIVE: No new complaint.  He reports the left-sided weakness is improving. ? ?Oncology History  ?Cancer with unknown primary site [facility])  ?10/11/2021 Initial Diagnosis  ? Cancer with unknown primary site [facility]) ?  ?10/19/2021 -  Chemotherapy  ? Patient is on Treatment Plan : COLORECTAL FOLFOX q14d  ?   ? ?PHYSICAL EXAMINATION: ? ?Vitals:  ? 01/27/22 0321 01/27/22 0800  ?BP: 124/85 111/74  ?Pulse: 83 90  ?Resp: 18 18  ?Temp: 98.3 ?F (36.8 ?C) 98.4 ?F (36.9 ?C)  ?SpO2: 98% 100%  ? ?Filed Weights  ? 01/23/22 0654  ?Weight: 182 lb 15.7 oz (83 kg)  ? ? ?Intake/Output from previous day: ?03/16 0701 - 03/17 0700 ?In: -  ?Out: 2050 [Urine:2050] ? ?Physical exam: ?HEENT-no thrush ?Abdomen-no hepatosplenomegaly, no mass ?Vascular-no leg edema ?Neurologic-4/5 strength in the left arm and leg, alert and oriented, the speech is fluent ? ?LABORATORY DATA:  ?I have reviewed the data as listed ?CMP Latest Ref Rng & Units 01/25/2022 01/23/2022 12/28/2021  ?Glucose 70 - 99 mg/dL 140(H) 123(H) 250(H)  ?BUN 8 - 23 mg/dL 21 24(H) 14  ?Creatinine 0.61 - 1.24 mg/dL 1.09 1.22 1.04  ?Sodium 135 - 145 mmol/L 135 137 136  ?Potassium 3.5 - 5.1 mmol/L 4.1 4.4 4.1  ?Chloride 98 - 111 mmol/L 102 102 101  ?CO2 22 - 32 mmol/L 24 28 26   ?Calcium 8.9 - 10.3 mg/dL 9.1 10.0 9.5  ?Total Protein 6.5 - 8.1 g/dL - 7.3 6.8  ?Total Bilirubin 0.3 - 1.2 mg/dL - 0.8 0.8  ?Alkaline Phos 38 - 126 U/L - 130(H) 85  ?AST 15 - 41 U/L - 38 32  ?ALT 0 - 44 U/L - 33 23  ? ? ?Lab Results  ?Component Value Date  ? WBC 4.7 01/23/2022  ? HGB 11.9 (L) 01/23/2022  ? HCT 37.1 (L) 01/23/2022  ? MCV 89.0 01/23/2022  ? PLT 155 01/23/2022  ? NEUTROABS 2.6 01/23/2022  ? ? ?Lab Results  ?Component Value Date  ? CEA 38.62 (H) 12/28/2021  ? [id number] 117 (H)  12/28/2021  ? ? ?CT ANGIO HEAD NECK W WO CM ? ?Result Date: 01/24/2022 ?CLINICAL DATA:  Stroke suspected EXAM: CT ANGIOGRAPHY HEAD AND NECK TECHNIQUE: Multidetector CT imaging of the head and neck was performed using the standard protocol during bolus administration of intravenous contrast. Multiplanar CT image reconstructions and MIPs were obtained to evaluate the vascular anatomy. Carotid stenosis measurements (when applicable) are obtained utilizing NASCET criteria, using the distal internal carotid diameter as the denominator. RADIATION DOSE REDUCTION: This exam was performed according to the departmental dose-optimization program which includes automated exposure control, adjustment of the mA and/or kV according to patient size and/or use of iterative reconstruction technique. CONTRAST:  43m OMNIPAQUE IOHEXOL 350 MG/ML SOLN COMPARISON:  CT head 01/23/2022, CTA 12/04/2021. FINDINGS: CT HEAD FINDINGS Brain: No evidence of acute infarction, hemorrhage, cerebral edema, mass, mass effect, or midline shift. No hydrocephalus or extra-axial fluid collection. Periventricular white matter changes, likely the sequela of chronic small vessel ischemic disease. Remote lacunar infarcts in the right thalamus, bilateral basal ganglia, and bilateral cerebellar hemispheres. Vascular: No hyperdense vessel. Skull: Normal. Negative for fracture or focal lesion. Sinuses/Orbits: No acute finding. Other: The mastoid air cells are well aerated. CTA NECK FINDINGS Aortic arch: Two-vessel arch with a common origin of the brachiocephalic and  left common carotid arteries. Imaged portion shows no evidence of aneurysm or dissection. No significant stenosis of the major arch vessel origins. Right carotid system: No evidence of dissection, stenosis (50% or greater) or occlusion. Left carotid system: No evidence of dissection, stenosis (50% or greater) or occlusion. Vertebral arteries: No evidence of dissection, stenosis (50% or greater) or  occlusion. Skeleton: No acute osseous abnormality. Degenerative changes in the cervical spine. Other neck: No acute finding. Upper chest: Right upper lobe nodule measures up to 11 x 12 mm in the axial plane (seri

## 2022-01-27 NOTE — Progress Notes (Signed)
?PROGRESS NOTE ? ? ? ?[name]  JHE:[id number] DOB: October 24, 1941 DOA: 01/23/2022 ?PCP: [name], MD  ? ? ?Brief Narrative:  ?Robert Warner is a 81 year old male with past medical history significant for multiple CVAs, intra-atrial shunt, metastatic adenocarcinoma of the liver of unknown primary on chemotherapy, left peroneal vein DVT January 2023 DM2, BPH who initially presented to [place] on 3/13 with unsteady gait and left-sided weakness.  Patient states woke up from bed at 5 AM attempting to use the bathroom but felt unsteady with ambulation with both right upper/lower extremity weakness.  Patient utilizes a cane at baseline.  Patient also reports dysuria for the last several days.  Denies headache, no blurred vision, no slurred speech or facial droop.  Further denies abdominal pain, no nausea/vomiting, no fever, no chest pain or palpitations. ? ?Patient with history of embolic stroke November 1856 with similar symptoms.  Echocardiogram showing intra atrium right to left shunt.  Was on Plavix and a statin for secondary prevention but had a breakthrough stroke January 2023. Given history of left to right shunt lower extremity venous duplex was ordered which was positive for acute DVT. He was treated with IV heparin and ultimately placed on Eliquis. He was also incidentally found during that hospitalization to have Klebsiella bacteremia on 12/05/2020 and treated initially on IV Rocephin and narrowed to IV cefazolin and then to cefadroxil for a total of 2 weeks. Had outpatient follow up with ID on 01/03/2022 and was released due to no signs of infection.  ? ?In the ED, temperature 98.4 ?F, HR 84, RR 16, BP 151/83, SPO2 97% on room air.  WBC 4.7, hemoglobin 11.9, platelets 155.  Sodium 137, potassium 4.4, chloride 102, CO2 28, glucose 123, BUN 24, creatinine 1.22.  AST 38, ALT 33, total bilirubin 0.8.  COVID-19 PCR negative.  Influenza A/B PCR negative.  EtOH level less than 10.  Urinalysis  with large leukocytes, positive nitrite, many bacteria, greater than 50 WBCs.  CT head without contrast with no acute intracranial abnormalities, chronic small vessel ischemic disease and brain atrophy, remote lacunar infarcts within the bilateral basal ganglia, right thalamus and bilateral cerebral hemispheres.  CT angiogram head/neck with no intracranial large vessel occlusion or significant stenosis, notable interval increase right upper lobe nodule measuring 12 mm, previously 9 mm.  MR brain with and without contrast with multiple mostly punctate acute infarcts in the right greater than left cerebral hemispheres and left cerebellar hemisphere with larger area of infarction in the posterior right frontal lobe suspicion for embolic etiology, area of enhancement left cerebral hemisphere favored to represent a subacute infarct.  Patient was assessed by teleneurology with recommendations of transfer to [facility] for continued work-up and neurology evaluation.   ? ?  ? ? ?Assessment & Plan: ?  ?Assessment and Plan: ?* Acute embolic stroke ([place]) ?Patient presenting to ED with gait abnormality, left-sided weakness.  History of embolic CVA 31/49 with recurrence 1/23.  History of left to right interatrial shunt noted on TTE 11/22.  Currently on aspirin and Eliquis, denies missed doses. MR brain with and without contrast with multiple mostly punctate acute infarcts in the right greater than left cerebral hemispheres and left cerebellar hemisphere with larger area of infarction in the posterior right frontal lobe suspicion for embolic etiology, area of enhancement left cerebral hemisphere favored to represent a subacute infarct.  CTA head/neck with no large vessel occlusion or significant cranial stenosis.  TTE 1/23 with LVEF 50-55%, no  regional wall motion normalities, grade 1 diastolic dysfunction, aortic dilation 39 mm.  LDL 85 ?--Neurology following, appreciate assistance ?--Hemoglobin A1c: 8.0 ?--PT/OT  recommended CIR; pending insurance authorization ?--Eliquis changed to Lovenox '120mg'$  Puhi daily ?--Aspirin 81 mg p.o. daily ?--Atorvastatin 40 mg p.o. daily ? ? ? ?UTI (urinary tract infection) ?Urinalysis with large leukocytes, positive nitrite, many bacteria, greater than 50 WBCs.  Patient complaining of dysuria. ?--Ceftriaxone 1 g IV every 24 hours; plan 7-day course. ? ?DVT in Left peroneal vein ?Vascular duplex ultrasound 12/06/2021 with age-indeterminate DVT left peroneal veins. ?--Eliquis changed to Lovenox 120 mg [place] q24h per oncology recommendations ? ?Type 2 diabetes mellitus with hyperglycemia (HCC) ?Home regimen includes Toujeo 25 units daily and metformin XR 750 mg p.o. daily.  ?--Hemoglobin A1c: 8.0 ?--Holding oral hypoglycemics while inpatient ?--Semglee 16u [place] daily ?--sensitive SSI for coverage ?--CBGs qAC/HS ? ?Cancer with unknown primary site [facility]) ?Follows with medical oncology, [name].  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  CT chest/abdomen/pelvis 3/15 with new and enlarging hepatic metastatic lesions, masslike appearance along gallbladder/porta hepatis, right upper lobe nodule increased in size concerning for progression of his metastatic disease.  Medical oncology plans outpatient follow-up consider salvage systemic chemotherapy. ? ?Interatrial cardiac shunt ?TTE 09/17/2021 notable for positive shunting with agitated saline suggestive of interatrial shunt.  Transcranial Doppler 09/20/2021 positive for medium size right to left shunt. ? ?BPH (benign prostatic hyperplasia) ?--Tamsulosin 0.4 mg p.o. daily (substituted for Rapaflo) ? ? ? ? ?DVT prophylaxis:   Lovenox ? ?  Code Status: Full Code ?Family Communication: No family present at bedside this morning ? ?Disposition Plan:  ?Level of care: Telemetry Medical ?Status is: Inpatient ?Remains inpatient appropriate because: Pending CIR ?  ? ?Consultants:  ?Neurology ?Medical oncology, [name] ? ?Procedures:   ?None ? ?Antimicrobials:  ?Ceftriaxone 3/13>> ? ? ? ?Subjective: ?Patient seen examined bedside, sitting in bedside chair.  Working with occupational therapy.  Continues with left-sided weakness.  Patient very ambitious to continue aggressive physical and occupational therapy.  Still awaiting insurance authorization for inpatient rehabilitation.  Seen by [name] this morning with plan outpatient follow-up for consideration of salvage systemic chemotherapy.  No other specific questions or concerns at this time.  Denies headache, no visual changes, no chest pain, palpitations, no shortness of breath, no abdominal pain, no fatigue, no nausea/vomiting/diarrhea, no fever/chills/night sweats, no paresthesias.  No acute events overnight per nursing staff.   ? ?Objective: ?Vitals:  ? 01/26/22 2314 01/27/22 0321 01/27/22 0800 01/27/22 1111  ?BP: 123/83 124/85 111/74 120/87  ?Pulse: 86 83 90 80  ?Resp: '18 18 18 20  '$ ?Temp: 98.2 ?F (36.8 ?C) 98.3 ?F (36.8 ?C) 98.4 ?F (36.9 ?C) 98.1 ?F (36.7 ?C)  ?TempSrc: Oral Oral Axillary Oral  ?SpO2: 100% 98% 100% 100%  ?Weight:      ?Height:      ? ? ?Intake/Output Summary (Last 24 hours) at 01/27/2022 1153 ?Last data filed at 01/27/2022 0327 ?Gross per 24 hour  ?Intake --  ?Output 1500 ml  ?Net -1500 ml  ? ?Filed Weights  ? 01/23/22 0654  ?Weight: 83 kg  ? ? ?Examination: ? ?Physical Exam: ?GEN: NAD, alert and oriented x 3, wd/wn ?HEENT: NCAT, PERRL, EOMI, sclera clear, MMM ?PULM: CTAB w/o wheezes/crackles, normal respiratory effort, on room air ?CV: RRR w/o M/G/R ?GI: abd soft, NTND, NABS, no R/G/M ?MSK: no peripheral edema, muscle strength Left UE/LE 4/5, right UE/LE 5/5 ?NEURO: CN II-XII intact, muscles strength discrepancy as  above with no other focal deficits appreciated, sensation to light touch intact ?PSYCH: normal mood/affect ?Integumentary: dry/intact, no rashes or wounds ? ? ? ?Data Reviewed: I have personally reviewed following labs and imaging studies ? ?CBC: ?Recent Labs   ?Lab 01/23/22 ?0700  ?WBC 4.7  ?NEUTROABS 2.6  ?HGB 11.9*  ?HCT 37.1*  ?MCV 89.0  ?PLT 155  ? ?Basic Metabolic Panel: ?Recent Labs  ?Lab 01/23/22 ?0700 01/25/22 ?0122  ?NA 137 135  ?K 4.4 4.1  ?CL 102 102  ?C

## 2022-01-27 NOTE — Progress Notes (Signed)
Inpatient Rehabilitation Admissions Coordinator  ? ?I have received insurance approval for CIR admit. I met with patient and wife and they are in agreement to admit. Acute team and TOC made aware. I will make the arrangements to admit today. ? ?Danne Baxter, RN, MSN ?Rehab Admissions Coordinator ?[phone] ?01/27/2022 2:48 PM ? ?

## 2022-01-27 NOTE — Plan of Care (Addendum)
?  Problem: RH BLADDER ELIMINATION ?Goal: RH STG MANAGE BLADDER WITH ASSISTANCE ?Description: STG Manage Bladder With mod I Assistance ?Outcome: Not Progressing; use of male purewick at hs ?  ?

## 2022-01-27 NOTE — Progress Notes (Signed)
[name], MD  ?Physician ?Physical Medicine and Rehabilitation ?PMR Pre-admission    ?Signed ?Date of Service:  01/25/2022 12:08 PM ? Related encounter: ED to Hosp-Admission (Current) from 01/23/2022 in [place] ?  ?Signed    ?  ?Show:Clear all ?'[x]'$ Written'[x]'$ Templated'[]'$ Copied ? ?Added by: ?'[x]'$ Maurisio Ruddy, Vertis Kelch, RN'[x]'$ Ranell Patrick Clide Deutscher, MD ? ?'[]'$ Hover for details ?   ?   ?   ?   ?   ?   ?   ?   ?   ?   ?   ?   ?   ?   ?   ?   ?   ?   ?   ?   ?   ?   ?   ?   ?   ?   ?   ?   ?   ?   ?   ?   ?   ?   ?   ?   ?   ?   ?   ?   ?   ?   ?   ?   ?   ?   ?   ?   ?   ?   ?   ?   ?   ?   ?   ?   ?   ?   ?   ?   ?   ?   ?   ?   ?   ?   ?   ?   ?   ?   ?   ?   ?   ?   ?   ?   ?   ?   ?   ?   ?   ?   ?   ?   ?   ?   ?   ?   ?   ?   ?   ?   ?   ?   ?   ?   ?   ?   ?   ?   ?   ?   ?   ?   ?   ?   ?   ?   ?   ?   ?   ?   ?   ?   ?   ?   ?   ?   ?   ?   ?   ?   ?   ?   ?   ?   ?   ?   ?   ?   ?   ?   ?   ?   ?   ?   ?   ?PMR Admission Coordinator Pre-Admission Assessment ?  ?Patient: Robert Warner is an 81 y.o., male ?MRN: [id number] ?DOB: 04/02/1941 ?Height: '5\' 10"'$  (177.8 cm) ?Weight: 83 kg ?  ?Insurance Information ?HMO: yes    PPO:      PCP:      IPA:      80/20:      OTHER:  ?PRIMARY: [employer]      Policy#: [id number]      Subscriber: pt ?CM Name: Sena      Phone#: [phone] ext [phone]     Fax#: [phone] ?Pre-Cert#: [id number]  f/u with Edwena Felty to same fax in 7 days    Employer:  ?Benefits:  Phone #: [phone]     Name: 3/15 ?  Eff. Date: 11/13/21     Deduct: none      Out of Pocket Max: $3400      Life Max: none ?CIR: $295 co pay per day days 1 until 6      SNF: no copay days 1 until 20; $196 co pay per day days 21 until 100 ?Outpatient: $10 to $20 per visit     Co-Pay: visits per medical neccesity ?Home Health: 100%      Co-Pay: visits per medical neccesity ?DME: 80%     Co-Pay: 20% ?Providers: in network ? ?SECONDARY: Tricare for Life      Policy#: [id number] ?  ?Financial Counselor:        Phone#:  ?  ?The ?Data Collection Information Summary? for patients in Inpatient Rehabilitation Facilities with attached ?Privacy Act [place] Records? was provided and verbally reviewed with: Patient and Family ?  ?Emergency Contact Information ?Contact Information   ?  ?  Name Relation Home Work Mobile  ?  HAYWARD, RYLANDER Spouse [phone]   [phone]  ?  Santi, Troung Daughter     [phone]  ?  ?   ?  ?Current Medical History  ?Patient Admitting Diagnosis: CVA ?  ?History of Present Illness: 81 year old male with history of multiple CVA's, intra-atrial shunt, Klebsiella bacteremia, metastatic adenocarcinoma of liver on unknown primary on chemotherapy, left peroneal vein DVT 1/23 who presented on 01/23/22 with unsteady gait and left sided weakness.  ?  ? MRI showed embolic shower with largest at right MCA territory. UA WBC more than 50. CT head and neck no significant vascular stenosis. However right upper lobe lung nodule increased in size. LE venous doppler no acute DVT but chronic left peroneal DVT. Hgb A1c 8.0. Neurology consulted and felt etiology likely due to hypercoagulable state in the setting of  advanced cancer. Compliant with Eliquis at home. Currently CTA neck showed right upper lobe nodule increased in size concerning for continued malignancy progression. Dr Benay Spice oncologist recommends switch from Eliquis to Lovenox for further stroke prevention. Continue home Lipitor. DM II with metformin and insulin glargine. Rocephin for UTI. Flomax for BPH.  ?  ?Oncology with restaging CT scan of the abdomen/pelvis . Noted evidence of mild disease progression per oncology, Dr Benay Spice. Discussed with patient and wife and plan to see patient in outpatient after rehab to discuss salvage systemic treatment with gemcitabine/cisplatin versus comfort measures depending on performance status.  ?  ?Complete NIHSS TOTAL: 4 ?  ?Patient's medical record from [facility]  has been reviewed  by the rehabilitation admission coordinator and physician. ?  ?Past Medical History  ?    ?Past Medical History:  ?Diagnosis Date  ? Arthritis    ? Cancer [facility])    ? Cataract    ? Diabetes mellitus    ? Hyperlipidemia    ? Stroke [facility])    ?  ?Has the patient had major surgery during 100 days prior to admission? No ?  ?Family History   ?family history includes Breast cancer in his paternal aunt; Coronary artery disease in his maternal grandmother, maternal uncle, mother, and paternal aunt; Diabetes in his maternal aunt and maternal uncle; Hypertension in his mother; Pancreatic cancer in his brother; Stroke in his paternal grandmother and sister; Stroke (age of onset: 85) in his father. ?  ?Current Medications ?  ?Current Facility-Administered Medications:  ?  acetaminophen (TYLENOL) tablet 650 mg, 650 mg, Oral, Q8H PRN, Tu, Ching T, DO, 650 mg at 01/26/22 2125 ?  atorvastatin (LIPITOR) tablet 40 mg, 40 mg, Oral, Daily, Tu, Ching T, DO, 40 mg at 01/27/22 1035 ?  cefTRIAXone (ROCEPHIN) 1 g in sodium chloride 0.9 % 100 mL IVPB, 1 g, Intravenous, Q24H, [place] Eric J, DO, Last Rate: 200 mL/hr at 01/27/22 1030, 1 g at 01/27/22 1030 ?  cholecalciferol (VITAMIN D3) tablet 2,000 Units, 2,000 Units, Oral, Daily, Tu, Ching T, DO, 2,000 Units at 01/27/22 1035 ?  enoxaparin (LOVENOX) injection 120 mg, 120 mg, Subcutaneous, Q24H, Rozann Lesches, RPH, 120 mg at 01/27/22 1052 ?  insulin aspart (novoLOG) injection 0-9 Units, 0-9 Units, Subcutaneous, TID WC, Tu, Ching T, DO, 3 Units at 01/27/22 1243 ?  insulin glargine-yfgn (SEMGLEE) injection 16 Units, 16 Units, Subcutaneous, Daily, [place] Eric J, [place], [place] Units at 01/27/22 1036 ?  latanoprost (XALATAN) 0.005 % ophthalmic solution 1 drop, 1 drop, Both Eyes, QHS, Tu, Ching T, DO, 1 drop at 01/26/22 2119 ?  pantoprazole (PROTONIX) EC tablet 20 mg, 20 mg, Oral, Daily, Tu, Ching T, DO, 20 mg at 01/27/22 1034 ?  polyethylene glycol (MIRALAX / GLYCOLAX) packet 17 g, 17 g, Oral, Daily  PRN, Tu, Ching T, DO ?  tamsulosin (FLOMAX) capsule 0.4 mg, 0.4 mg, Oral, QPC supper, Tu, Ching T, DO, 0.4 mg at 01/26/22 1741 ?  traMADol (ULTRAM) tablet 50 mg, 50 mg, Oral, Q6H PRN, Tu, Ching T, DO, 50 mg at 01/27/22 0452 ?  ?Patients Current Diet:  ?Diet Order   ?  ?         ?    Diet - low sodium heart healthy       ?  ?    Diet Heart Room service appropriate? Yes; Fluid consistency: Thin  Diet effective now       ?  ?  ?   ?  ?  ?   ?  ?Precautions / Restrictions ?Precautions ?Precautions: Fall ?Precaution Comments: High fall risk, poor safety awareness ?Restrictions ?Weight Bearing Restrictions: No  ?  ?Has the patient had 2 or more falls or a fall with injury in the past year? No ?  ?Prior Activity Level ?Community (5-7x/wk): Mod I using cane pta ?  ?Prior Functional Level ?Self Care: Did the patient need help bathing, dressing, using the toilet or eating? Needed some help ?  ?Indoor Mobility: Did the patient need assistance with walking from room to room (with or without device)? Independent ?  ?Stairs: Did the patient need assistance with internal or external stairs (with or without device)? Independent ?  ?Functional Cognition: Did the patient need help planning regular tasks such as shopping or remembering to take medications? Needed some help ?  ?Patient Information ?Are you of Hispanic, Latino/a,or Spanish origin?: A. No, not of Hispanic, Latino/a, or Spanish origin ?What is your race?: B. Black or African American ?Do you need or want an interpreter to communicate with a doctor or health care staff?: 0. No ?  ?Patient's Response To:  ?Health Literacy and Transportation ?Is the patient able to respond to health literacy and transportation needs?: Yes ?Health Literacy - How often do you need to have someone help you when you read instructions, pamphlets, or other written material from your doctor or pharmacy?: Never ?In the past 12 months, has lack of transportation kept you from medical appointments or  from getting medications?: No ?In the past 12 months, has lack of transportation kept you from meetings, work, or from getting things needed for daily living?: No ?  ?Home  Assistive Devices / Equipment

## 2022-01-27 NOTE — Progress Notes (Signed)
Physical Therapy Treatment ?Patient Details ?Name: Robert Warner ?MRN: [id number] ?DOB: 1941/05/17 ?Today's Date: 01/27/2022 ? ? ?History of Present Illness 81 yo male who presented with unsteady gait and left sided weakness. CT head was negative for any acute findings, MRI reveals multiple infarcts in L cereblloar region and L & R cerebral regions. PMH includes multiple CVA,intra-atrial shunt, metastatic adenocarcinoma of liver of unknown primary on chemotherapy, left peroneal vein DVT 11/2021. ? ?  ?PT Comments  ? ? Pt progressing well toward goals.  Still limited by significant incoordination L>R side, pt with decreased awareness of instabilities with some mild impulsiveness.  Emphasis on scooting symmetrically, preparing for safe transfers, trials of sit to stand, approx 6 minutes of pre-gait  over 2 trials and progression of gait training   6 feet forward and back x2.  Such a great candidate for AIR. ?   ?Recommendations for follow up therapy are one component of a multi-disciplinary discharge planning process, led by the attending physician.  Recommendations may be updated based on patient status, additional functional criteria and insurance authorization. ? ?Follow Up Recommendations ? Acute inpatient rehab (3hours/day) ?  ?  ?Assistance Recommended at Discharge Frequent or constant Supervision/Assistance  ?Patient can return home with the following A lot of help with walking and/or transfers;A little help with bathing/dressing/bathroom;Assistance with cooking/housework;Assist for transportation;Assistance with feeding;Help with stairs or ramp for entrance ?  ?Equipment Recommendations ? None recommended by PT  ?  ?Recommendations for Other Services   ? ? ?  ?Precautions / Restrictions Precautions ?Precautions: Fall ?Precaution Comments: High fall risk, poor safety awareness  ?  ? ?Mobility ? Bed Mobility ?  ?  ?  ?  ?  ?  ?  ?General bed mobility comments: OOB in the recliner, returned to recliner after  session. ?  ? ?Transfers ?Overall transfer level: Needs assistance ?  ?Transfers: Sit to/from Stand ?Sit to Stand: Mod assist, +2 safety/equipment ?  ?  ?  ?  ?  ?General transfer comment: cues and minimal assist for transfer prep at edge of the chair.  Assist forward and for boost and stability ?  ? ?Ambulation/Gait ?Ambulation/Gait assistance: Mod assist, +2 safety/equipment, +2 physical assistance ?Gait Distance (Feet): 6 Feet (forward and back x2) ?Assistive device: Rolling walker (2 wheels) ?Gait Pattern/deviations: Step-to pattern, Step-through pattern ?Gait velocity: decr ?Gait velocity interpretation: <1.31 ft/sec, indicative of household ambulator ?Pre-gait activities: 2 trials of pre gait activity ~ 3 min each working on coordinated w/shifting-controlled unweighting and stepping.  midline orientation with balance in midline. ?General Gait Details: uncoordinated steps L worse than R LE, tending to narrow toward tandom unles cued to widen.  cues for subtle toe off forward and back.  Assist with w/shifting and control of unweighting. ? ? ?Stairs ?  ?  ?  ?  ?  ? ? ?Wheelchair Mobility ?  ? ?Modified Rankin (Stroke Patients Only) ?Modified Rankin (Stroke Patients Only) ?Pre-Morbid Rankin Score: No symptoms ?Modified Rankin: Moderately severe disability ? ? ?  ?Balance Overall balance assessment: Needs assistance ?Sitting-balance support: Feet supported ?Sitting balance-Leahy Scale: Fair ?  ?  ?  ?Standing balance-Leahy Scale: Poor ?Standing balance comment: standing at edge of chair for 2 trials on pregait and balance in the RW ?  ?  ?  ?  ?  ?  ?  ?  ?  ?  ?  ?  ? ?  ?Cognition Arousal/Alertness: Awake/alert ?Behavior During Therapy: [facility] for tasks assessed/performed ?Overall Cognitive Status:  (  NT formally) ?  ?  ?  ?  ?  ?  ?  ?  ?  ?  ?Current Attention Level: Sustained ?  ?Following Commands: Follows one step commands consistently ?Safety/Judgement: Decreased awareness of safety ?Awareness:  Emergent ?Problem Solving: Difficulty sequencing ?  ?  ?  ? ?  ?Exercises   ? ?  ?General Comments   ?  ?  ? ?Pertinent Vitals/Pain Pain Assessment ?Pain Assessment: No/denies pain ?Pain Intervention(s): Monitored during session  ? ? ?Home Living   ?  ?  ?  ?  ?  ?  ?  ?  ?  ?   ?  ?Prior Function    ?  ?  ?   ? ?PT Goals (current goals can now be found in the care plan section) Acute Rehab PT Goals ?Patient Stated Goal: to go home ?PT Goal Formulation: With patient/family ?Time For Goal Achievement: 02/07/22 ?Potential to Achieve Goals: Good ?Progress towards PT goals: Progressing toward goals ? ?  ?Frequency ? ? ? Min 4X/week ? ? ? ?  ?PT Plan Current plan remains appropriate  ? ? ?Co-evaluation   ?  ?  ?  ?  ? ?  ?AM-PAC PT "6 Clicks" Mobility   ?Outcome Measure ? Help needed turning from your back to your side while in a flat bed without using bedrails?: A Little ?Help needed moving from lying on your back to sitting on the side of a flat bed without using bedrails?: A Little ?Help needed moving to and from a bed to a chair (including a wheelchair)?: A Lot ?Help needed standing up from a chair using your arms (e.g., wheelchair or bedside chair)?: A Lot ?Help needed to walk in hospital room?: A Lot ?Help needed climbing 3-5 steps with a railing? : Total ?6 Click Score: 13 ? ?  ?End of Session Equipment Utilized During Treatment: Gait belt ?Activity Tolerance: Patient tolerated treatment well ?Patient left: in chair;with call bell/phone within reach;with chair alarm set;with family/visitor present ?Nurse Communication: Mobility status ?PT Visit Diagnosis: Unsteadiness on feet (R26.81);Other abnormalities of gait and mobility (R26.89);Muscle weakness (generalized) (M62.81);Difficulty in walking, not elsewhere classified (R26.2);Ataxic gait (R26.0) ?  ? ? ?Time: 9417-4081 ?PT Time Calculation (min) (ACUTE ONLY): 29 min ? ?Charges:  $Gait Training: 8-22 mins ?$Neuromuscular Re-education: 8-22 mins          ?           ? ?01/27/2022 ? ?Ginger Carne., PT ?Acute Rehabilitation Services ?[phone]  (pager) ?[phone]  (office) ? ? ?Tessie Fass Robert Warner ?01/27/2022, 12:58 PM ? ?

## 2022-01-27 NOTE — Progress Notes (Signed)
Patient ID: Halim S Musil, male   DOB: 01/09/1941, 80 y.o.   MRN: [id number] ?Met with the patient and wife to introduce self, review role, rehab process, team conference and plan of care. Patient's wife noted bladder management issues PTA; patient noted urgency and requesting purewick at HS; agreeable to trial off during the day w toieting protocol. Doesn't get much notice of urge to void and current UTI. Also reviewed secondary risks including DM (A1C 8.0) and recent change from Eliquis to lovenox for DVT/hypercoag state management. Continue to follow along to discharge to address educational needs to facilitate preparation for discharge. Sharp, Deborah B ? ?

## 2022-01-28 LAB — COMPREHENSIVE METABOLIC PANEL
ALT: 49 U/L — ABNORMAL HIGH (ref 0–44)
AST: 59 U/L — ABNORMAL HIGH (ref 15–41)
Albumin: 2.7 g/dL — ABNORMAL LOW (ref 3.5–5.0)
Alkaline Phosphatase: 149 U/L — ABNORMAL HIGH (ref 38–126)
Anion gap: 8 (ref 5–15)
BUN: 19 mg/dL (ref 8–23)
CO2: 27 mmol/L (ref 22–32)
Calcium: 9 mg/dL (ref 8.9–10.3)
Chloride: 102 mmol/L (ref 98–111)
Creatinine, Ser: 1.14 mg/dL (ref 0.61–1.24)
GFR, Estimated: >60 mL/min (ref >60)
Glucose, Bld: 104 mg/dL — ABNORMAL HIGH (ref 70–99)
Potassium: 4.1 mmol/L (ref 3.5–5.1)
Sodium: 137 mmol/L (ref 135–145)
Total Bilirubin: 0.5 mg/dL (ref 0.3–1.2)
Total Protein: 6.3 g/dL — ABNORMAL LOW (ref 6.5–8.1)

## 2022-01-28 LAB — CBC WITH DIFFERENTIAL/PLATELET
Abs Immature Granulocytes: 0.01 10*3/uL (ref 0.00–0.07)
Basophils Absolute: 0 10*3/uL (ref 0.0–0.1)
Basophils Relative: 1 %
Eosinophils Absolute: 0.2 10*3/uL (ref 0.0–0.5)
Eosinophils Relative: 4 %
HCT: 34.7 % — ABNORMAL LOW (ref 39.0–52.0)
Hemoglobin: 11.4 g/dL — ABNORMAL LOW (ref 13.0–17.0)
Immature Granulocytes: 0 %
Lymphocytes Relative: 30 %
Lymphs Abs: 1.3 10*3/uL (ref 0.7–4.0)
MCH: 29.5 pg (ref 26.0–34.0)
MCHC: 32.9 g/dL (ref 30.0–36.0)
MCV: 89.9 fL (ref 80.0–100.0)
Monocytes Absolute: 0.6 10*3/uL (ref 0.1–1.0)
Monocytes Relative: 13 %
Neutro Abs: 2.2 10*3/uL (ref 1.7–7.7)
Neutrophils Relative %: 52 %
Platelets: 185 10*3/uL (ref 150–400)
RBC: 3.86 MIL/uL — ABNORMAL LOW (ref 4.22–5.81)
RDW: 14.7 % (ref 11.5–15.5)
WBC: 4.3 10*3/uL (ref 4.0–10.5)
nRBC: 0 % (ref 0.0–0.2)

## 2022-01-28 LAB — GLUCOSE, CAPILLARY
Glucose-Capillary: 111 mg/dL — ABNORMAL HIGH (ref 70–99)
Glucose-Capillary: 106 mg/dL — ABNORMAL HIGH (ref 70–99)
Glucose-Capillary: 118 mg/dL — ABNORMAL HIGH (ref 70–99)
Glucose-Capillary: 148 mg/dL — ABNORMAL HIGH (ref 70–99)

## 2022-01-28 MED ORDER — SODIUM CHLORIDE 0.9% FLUSH: 10.0000 mL | INTRAVENOUS | Status: DC | PRN

## 2022-01-28 MED ORDER — CHLORHEXIDINE GLUCONATE CLOTH 2 % EX PADS
6.0000 | MEDICATED_PAD | Freq: Every day | CUTANEOUS | Status: DC
  Administered 2022-01-28 – 2022-02-06 (×10): 6 via TOPICAL

## 2022-01-28 MED ORDER — CEFDINIR 300 MG PO CAPS
300.0000 mg | ORAL_CAPSULE | Freq: Two times a day (BID) | ORAL | Status: AC
  Administered 2022-01-29 – 2022-01-30 (×4): 300 mg via ORAL
  Filled 2022-01-28 (×4): qty 1

## 2022-01-28 MED ORDER — ASPIRIN EC 81 MG PO TBEC
81.0000 mg | DELAYED_RELEASE_TABLET | Freq: Every day | ORAL | Status: DC
  Administered 2022-01-28 – 2022-02-09 (×13): 81 mg via ORAL
  Filled 2022-01-28 (×14): qty 1

## 2022-01-28 NOTE — Plan of Care (Signed)
?  Problem: RH Balance ?Goal: LTG Patient will maintain dynamic standing with ADLs (OT) ?Description: LTG:  Patient will maintain dynamic standing balance with assist during activities of daily living (OT)  ?Flowsheets (Taken 01/28/2022 1257) ?LTG: Pt will maintain dynamic standing balance during ADLs with: Contact Guard/Touching assist ?  ?Problem: Sit to Stand ?Goal: LTG:  Patient will perform sit to stand in prep for activites of daily living with assistance level (OT) ?Description: LTG:  Patient will perform sit to stand in prep for activites of daily living with assistance level (OT) ?Flowsheets (Taken 01/28/2022 1257) ?LTG: PT will perform sit to stand in prep for activites of daily living with assistance level: Supervision/Verbal cueing ?  ?Problem: RH Grooming ?Goal: LTG Patient will perform grooming w/assist,cues/equip (OT) ?Description: LTG: Patient will perform grooming with assist, with/without cues using equipment (OT) ?Flowsheets (Taken 01/28/2022 1257) ?LTG: Pt will perform grooming with assistance level of: Supervision/Verbal cueing ?  ?Problem: RH Bathing ?Goal: LTG Patient will bathe all body parts with assist levels (OT) ?Description: LTG: Patient will bathe all body parts with assist levels (OT) ?Flowsheets (Taken 01/28/2022 1257) ?LTG: Pt will perform bathing with assistance level/cueing: Contact Guard/Touching assist ?  ?Problem: RH Dressing ?Goal: LTG Patient will perform upper body dressing (OT) ?Description: LTG Patient will perform upper body dressing with assist, with/without cues (OT). ?Flowsheets (Taken 01/28/2022 1257) ?LTG: Pt will perform upper body dressing with assistance level of: Set up assist ?Goal: LTG Patient will perform lower body dressing w/assist (OT) ?Description: LTG: Patient will perform lower body dressing with assist, with/without cues in positioning using equipment (OT) ?Flowsheets (Taken 01/28/2022 1257) ?LTG: Pt will perform lower body dressing with assistance level of:  Contact Guard/Touching assist ?  ?Problem: RH Toileting ?Goal: LTG Patient will perform toileting task (3/3 steps) with assistance level (OT) ?Description: LTG: Patient will perform toileting task (3/3 steps) with assistance level (OT)  ?Flowsheets (Taken 01/28/2022 1257) ?LTG: Pt will perform toileting task (3/3 steps) with assistance level: Supervision/Verbal cueing ?  ?Problem: RH Functional Use of Upper Extremity ?Goal: LTG Patient will use RT/LT upper extremity as a (OT) ?Description: LTG: Patient will use right/left upper extremity as a stabilizer/gross assist/diminished/nondominant/dominant level with assist, with/without cues during functional activity (OT) ?Flowsheets (Taken 01/28/2022 1257) ?LTG: Use of upper extremity in functional activities: LUE as gross assist level ?LTG: Pt will use upper extremity in functional activity with assistance level of: Supervision/Verbal cueing ?  ?Problem: RH Toilet Transfers ?Goal: LTG Patient will perform toilet transfers w/assist (OT) ?Description: LTG: Patient will perform toilet transfers with assist, with/without cues using equipment (OT) ?Flowsheets (Taken 01/28/2022 1257) ?LTG: Pt will perform toilet transfers with assistance level of: Contact Guard/Touching assist ?  ?Problem: RH Awareness ?Goal: LTG: Patient will demonstrate awareness during functional activites type of (OT) ?Description: LTG: Patient will demonstrate awareness during functional activites type of (OT) ?Flowsheets (Taken 01/28/2022 1257) ?Patient will demonstrate awareness during functional activites type of: Emergent ?LTG: Patient will demonstrate awareness during functional activites type of (OT): Minimal Assistance - Patient > 75% ?  ?

## 2022-01-28 NOTE — Evaluation (Signed)
Physical Therapy Assessment and Plan ? ?Patient Details  ?Name: Robert Warner ?MRN: [id number] ?Date of Birth: [birth year] ? ?PT Diagnosis: Coordination disorder, Difficulty walking, Dizziness and giddiness, Hemiparesis non-dominant, Impaired cognition, and Muscle weakness ?Rehab Potential: Fair ?ELOS: 14-18 days  ? ?Today's Date: 01/28/2022 ?PT Individual Time: 9678-9381 ?PT Individual Time Calculation (min): 78 min   ? ?Hospital Problem: Principal Problem: ?  CVA (cerebral vascular accident) ([place]) ? ? ?Past Medical History:  ?Past Medical History:  ?Diagnosis Date  ? Arthritis   ? Cancer [facility])   ? Cataract   ? Diabetes mellitus   ? Hyperlipidemia   ? Stroke [facility])   ? ?Past Surgical History:  ?Past Surgical History:  ?Procedure Laterality Date  ? APPENDECTOMY  1968  ? CATARACT EXTRACTION Bilateral 07/2000  ? states 2 weeks ago (first of sept) OD due in Dec   ? COLONOSCOPY  2004,2009  ? Negative, Dr. Sharlett Iles   ? IR IMAGING GUIDED PORT INSERTION  10/14/2021  ? KIDNEY STONE SURGERY  10/2018  ? PROSTATE BIOPSY  2006  ? Dr.Sigmund Tannebaum  ? ? ?Assessment & Plan ?Clinical Impression: Patient is a 81 y.o. male who presented to [facility] on 01/23/2022 with an approximately 1 to 2-hour history of left-sided weakness associated with imbalance.  He states he stumbled but did not fall or injure himself.  He was deemed out of the window for tPA.  His history is significant for metastatic adenocarcinoma of the liver with unknown primary and lower extremity DVT maintained on Eliquis.  He also has a history of prior CVA with left right intra-atrial shunt maintained on Plavix.  TeleNeurology consult services notified.  NIHSS = 2.  CT head reveals multiple old strokes involving bilateral basal ganglia and bilateral cerebral hemispheres and right thalamus.  Recommend admission and further stroke work-up.  Patient transferred to [facility] and admitted by hospitalist service.  Neurology consulted and Dr.  Rosalin Hawking evaluated the patient on 3/14.  Admission MRI showed embolic shower the largest of the right MCA territory.  CT head and neck no significant vascular stenosis.  Etiology for embolic stroke again likely due to hypercoagulable state in the setting of advanced cancer.  Patient related he has been compliant with Eliquis at home, indicating Eliquis failure at this time.  The patient has stopped chemotherapy due to intolerance and plans to follow-up with [name] with abdominal ultrasound evaluation pending.  Imaging revealed increase in size of right upper lobe lung nodule and concern for continued malignancy progression.  Dr. Erlinda Hong discussed this with [name] and he recommended switching from Eliquis to Lovenox for further stroke prevention.  Continuation of Lipitor 40 mg recommended. The patient requires inpatient medicine and rehabilitation evaluations and services for ongoing dysfunction secondary to bilateral right greater than left cerebral punctate infarcts and left cerebellar infarct..  Patient transferred to [facility] on 01/27/2022 .  ? ?Patient currently requires mod with mobility secondary to muscle weakness, decreased cardiorespiratoy endurance, impaired timing and sequencing, unbalanced muscle activation, decreased coordination, and decreased motor planning, decreased midline orientation and decreased attention to left, decreased attention, decreased awareness, decreased problem solving, and decreased safety awareness, and decreased sitting balance, decreased standing balance, decreased balance strategies, and hemipareisis .  Prior to hospitalization, patient was supervision with mobility and lived with Spouse in a House home.  Home access is garage 4 steps w/ R HR; front steps 10 steps with Bil railingStairs to enter. ? ?Patient will benefit from skilled PT  intervention to maximize safe functional mobility, minimize fall risk, and decrease caregiver burden for planned discharge home with 24 hour  assist.  Anticipate patient will benefit from follow up [facility] at discharge. ? ?PT - End of Session ?Activity Tolerance: Tolerates 30+ min activity with multiple rests ?Endurance Deficit: Yes ?PT Assessment ?Rehab Potential (ACUTE/IP ONLY): Fair ?PT Barriers to Discharge: Inaccessible home environment;Decreased caregiver support;Home environment access/layout;Incontinence;Insurance for SNF coverage;Pending chemo/radiation;Nutrition means ?PT Patient demonstrates impairments in the following area(s): Balance;Behavior;Endurance;Motor;Nutrition;Perception;Safety;Sensory ?PT Transfers Functional Problem(s): Bed Mobility;Bed to Chair;Car;Furniture ?PT Locomotion Functional Problem(s): Ambulation;Stairs;Wheelchair Mobility ?PT Plan ?PT Intensity: Minimum of 1-2 x/day ,45 to 90 minutes ?PT Frequency: 5 out of 7 days ?PT Duration Estimated Length of Stay: 14-18 days ?PT Treatment/Interventions: Ambulation/gait training;Balance/vestibular training;Cognitive remediation/compensation;Disease management/prevention;Discharge planning;Community reintegration;DME/adaptive equipment instruction;Functional electrical stimulation;Functional mobility training;Patient/family education;Neuromuscular re-education;Pain management;Psychosocial support;Skin care/wound management;Splinting/orthotics;Therapeutic Exercise;Therapeutic Activities;Stair training;UE/LE Strength taining/ROM;UE/LE Coordination activities;Visual/perceptual remediation/compensation;Wheelchair propulsion/positioning ?PT Transfers Anticipated Outcome(s): supervision ?PT Locomotion Anticipated Outcome(s): CGA ?PT Recommendation ?Recommendations for Other Services: Therapeutic Recreation consult ?Therapeutic Recreation Interventions: Clinical cytogeneticist;Outing/community reintergration ?Follow Up Recommendations: Home health PT;Outpatient PT ?Patient destination: Home ?Equipment Recommended: To be determined ? ? ?PT  Evaluation ?Precautions/Restrictions ?Precautions ?Precautions: Fall ?Precaution Comments: L hemipareisis, L inattention, poor safety awareness ?Restrictions ?Weight Bearing Restrictions: No ?General ?  Vital SignsTherapy Vitals ?Temp: 98.2 ?F (36.8 ?C) ?Pulse Rate: 82 ?Resp: 16 ?BP: 109/85 ?Patient Position (if appropriate): Sitting ?Oxygen Therapy ?SpO2: 99 % ?O2 Device: Room Air ?Pain ?Pain Assessment ?Pain Scale: 0-10 ?Pain Score: 0-No pain ?Pain Interference ?Pain Interference ?Pain Effect on Sleep: 0. Does not apply - I have not had any pain or hurting in the past 5 days ?Pain Interference with Therapy Activities: 0. Does not apply - I have not received rehabilitationtherapy in the past 5 days ?Pain Interference with Day-to-Day Activities: 1. Rarely or not at all ?Home Living/Prior Functioning ?Home Living ?Available Help at Discharge: Family;Available 24 hours/day ?Type of Home: House ?Home Access: Stairs to enter ?Entrance Stairs-Number of Steps: garage 4 steps w/ R HR; front steps 10 steps with Bil railing ?Entrance Stairs-Rails: Can reach both;Right ?Home Layout: Two level;Able to live on main level with bedroom/bathroom ?Alternate Level Stairs-Number of Steps: full flight does not use upstairs ?Alternate Level Stairs-Rails: Can reach both ?Bathroom Shower/Tub: Walk-in shower ?Bathroom Toilet: Handicapped height ?Bathroom Accessibility: Yes ?Additional Comments: indicates use of SPC AND that he completed 18 holes of golf the week prior to stroke. ? Lives With: Spouse ?Prior Function ?Level of Independence: Independent with basic ADLs;Independent with homemaking with ambulation;Independent with gait;Independent with transfers;Requires assistive device for independence ? Able to Take Stairs?: Reciprically ?Driving: No ?Vocation: Retired ?Vision/Perception  ?Vision - History ?Ability to See in Adequate Light: 0 Adequate ?Vision - Assessment ?Eye Alignment: Within Functional Limits ?Ocular Range of Motion:  Within Functional Limits ?Alignment/Gaze Preference: Within Defined Limits ?Tracking/Visual Pursuits: Requires cues, head turns, or add eye shifts to track ?Perception ?Perception: Impaired ?Inattention/Neglect: Does not attend to left visual field;Does

## 2022-01-28 NOTE — Progress Notes (Signed)
Physical Therapy Session Note ? ?Patient Details  ?Name: Robert Warner ?MRN: [id number] ?Date of Birth: 1941/07/27 ? ?Today's Date: 01/28/2022 ?PT Individual Time: 3291-9166 ?PT Individual Time Calculation (min): 57 min  ? ?Short Term Goals: ?Week 1:  PT Short Term Goal 1 (Week 1): Pt will demonstrate bed mobility with overall CGA. ?PT Short Term Goal 2 (Week 1): Pt will demonstrate consistent CGA with standing transfers while safely using LRAD. ?PT Short Term Goal 3 (Week 1): Pt will ambulate 150 feet consistently using LRAD with MinA. ?PT Short Term Goal 4 (Week 1): Pt will complete at least 4 steps using RHR to ascend with consistent MinA. ? ?Skilled Therapeutic Interventions/Progress Updates:  ?Pt received asleep, supine in bed - easily awakens and agreeable to therapy session. Supine>sitting R EOB, HOB partially elevated, with CGA for safety. Forward scooting towards EOB with pt requiring cuing and facilitation for safety with L wrist as it starts to bend too much into flexion. Pt reports need to use bathroom. L squat pivot transfer EOB>w/c with light mod/heavy min assist for balance and cuing for sequencing and L hemibody placement. Transported in/out bathroom in w/c.  ? ?Pt noted to be impulsive with decreased safety awareness throughout session requiring cuing for safety. ? ?R stand pivot w/c>BSC over toilet with light mod assist for balance and max cuing for sequencing LE stepping, turning, and to stand upright. Standing with mod assist for balance while managing LB clothing with max assist. Pt had been incontinent of bladder in brief and further continent on toilet - pt would likely benefit from timed toileting protocol. L stand pivot BSC over toilet>w/c using grab bar support and light mod assist for balance to pivot hips fully prior to sitting.  ? ?Seated in w/c hand hygiene with L UE incoordination noted.  Transported to/from gym in w/c for time management and energy conservation. ? ?Simulated stand  pivot car transfer to/from w/c (small SUV height) with light mod assist for balance - cuing for sequencing and proper hand placement to increased safety of transfer and increased pt independent.  ? ?Gait training 42f using RW with mod assist for balance ?- demos decreased L LE foot clearance, excessive L LE hip adduction/scissoring, lack of L hip extensor activation during L stance with facilitation for improvement ?- with fatigue has L knee mildly giving way, able to correct with verbal cuing and continued mod assist for balance ?- cuing for upright posture with trunk/hip extension ? ?[facility] risk as noted by score of  6/56.  (<36= high risk for falls, close to 100%; 37-45 significant >80%; 46-51 moderate >50%; 52-55 lower >25%). Details below - pt able to maintain static standing balance (after assistance for achieving position) for 30 seconds until posterior LOB. ? ?R stand pivot back to w/c with heavy min assist for balance. Transported back to room and pt agreeable to remain sitting up in w/c - left with needs in reach and seat belt alarm on. ? ?Therapy Documentation ?Precautions:  ?Precautions ?Precautions: Fall ?Precaution Comments: L hemipareisis/ataxia, L inattention, poor safety awareness ?Restrictions ?Weight Bearing Restrictions: No ? ? ?Pain: ?Denies pain during session. ? ?Balance: ?Balance ?Balance Assessed: Yes ?Standardized Balance Assessment ?Standardized Balance Assessment: Berg Balance Test ?BMerrilee JanskyBalance Test ?Sit to Stand: Needs minimal aid to stand or to stabilize ?Standing Unsupported: Needs several tries to stand 30 seconds unsupported (requries assistance and cuing to get into position) ?Sitting with Back Unsupported but Feet  Supported on Floor or Stool: Able to sit 2 minutes under supervision ?Stand to Sit: Needs assistance to sit ?Transfers: Needs one person to assist ?Standing Unsupported with Eyes Closed: Needs help to  keep from falling ?Standing Ubsupported with Feet Together: Needs help to attain position and unable to hold for 15 seconds (able to make it to 10 seconds) ?From Standing, Reach Forward with Outstretched Arm: Loses balance while trying/requires external support ?From Standing Position, Pick up Object from Floor: Unable to try/needs assist to keep balance ?From Standing Position, Turn to Look Behind Over each Shoulder: Needs assist to keep from losing balance and falling ?Turn 360 Degrees: Needs assistance while turning ?Standing Unsupported, Alternately Place Feet on Step/Stool: Needs assistance to keep from falling or unable to try ?Standing Unsupported, One Foot in Front: Loses balance while stepping or standing ?Standing on One Leg: Unable to try or needs assist to prevent fall ?Total Score: 6 ? ? ? ?Therapy/Group: Individual Therapy ? ?Tawana Scale , PT, DPT, NCS, CSRS ? ?01/28/2022, 3:12 PM  ?

## 2022-01-28 NOTE — Evaluation (Signed)
Occupational Therapy Assessment and Plan ? ?Patient Details  ?Name: Robert Warner ?MRN: [id number] ?Date of Birth: Jul 13, 1941 ? ?OT Diagnosis: ataxia, cognitive deficits, hemiplegia affecting non-dominant side, muscle weakness (generalized), and decreased safety awareness, L inattention ?Rehab Potential: Rehab Potential (ACUTE ONLY): Good ?ELOS: 2-2.5 weeks  ? ?Today's Date: 01/28/2022 ?OT Individual Time: 8921-1941 ?OT Individual Time Calculation (min): 63 min    ? ?Hospital Problem: Principal Problem: ?  CVA (cerebral vascular accident) ([place]) ? ? ?Past Medical History:  ?Past Medical History:  ?Diagnosis Date  ? Arthritis   ? Cancer [facility])   ? Cataract   ? Diabetes mellitus   ? Hyperlipidemia   ? Stroke [facility])   ? ?Past Surgical History:  ?Past Surgical History:  ?Procedure Laterality Date  ? APPENDECTOMY  1968  ? CATARACT EXTRACTION Bilateral 07/2000  ? states 2 weeks ago (first of sept) OD due in Dec   ? COLONOSCOPY  2004,2009  ? Negative, Dr. Sharlett Iles   ? IR IMAGING GUIDED PORT INSERTION  10/14/2021  ? KIDNEY STONE SURGERY  10/2018  ? PROSTATE BIOPSY  2006  ? Dr.Sigmund Tannebaum  ? ? ?Assessment & Plan ?Clinical Impression: Patient is a 81 y.o. year old male who presented to [place] on 01/23/2022 with an approximately 1 to 2-hour history of left-sided weakness associated with imbalance.  He states he stumbled but did not fall or injure himself.  He was deemed out of the window for tPA.  His history is significant for metastatic adenocarcinoma of the liver with unknown primary and lower extremity DVT maintained on Eliquis.  He also has a history of prior CVA with left right intra-atrial shunt maintained on Plavix.  TeleNeurology consult services notified.  NIHSS = 2.  CT head reveals multiple old strokes involving bilateral basal ganglia and bilateral cerebral hemispheres and right thalamus.  Recommend admission and further stroke work-up.  Patient transferred to [facility] and  admitted by hospitalist service.  Neurology consulted and Dr. Rosalin Hawking evaluated the patient on 3/14.  Admission MRI showed embolic shower the largest of the right MCA territory.  CT head and neck no significant vascular stenosis.  Etiology for embolic stroke again likely due to hypercoagulable state in the setting of advanced cancer.  Patient related he has been compliant with Eliquis at home, indicating Eliquis failure at this time.  The patient has stopped chemotherapy due to intolerance and plans to follow-up with [name] with abdominal ultrasound evaluation pending.  Imaging revealed increase in size of right upper lobe lung nodule and concern for continued malignancy progression.  Dr. Erlinda Hong discussed this with [name] and he recommended switching from Eliquis to Lovenox for further stroke prevention.  Continuation of Lipitor 40 mg recommended. The patient requires inpatient medicine and rehabilitation evaluations and services for ongoing dysfunction secondary to bilateral right greater than left cerebral punctate infarcts and left cerebellar infarct..  Patient transferred to CIR on 01/27/2022 .   ? ?Patient currently requires  min to mod  with basic self-care skills secondary to muscle weakness, decreased cardiorespiratoy endurance, impaired timing and sequencing, unbalanced muscle activation, ataxia, decreased coordination, and decreased motor planning, decreased visual motor skills, decreased attention to left and decreased motor planning, decreased awareness, decreased problem solving, decreased safety awareness, and decreased memory, and decreased standing balance, hemiplegia, and decreased balance strategies.  Prior to hospitalization, patient could complete BADL/mobility with modified independent . ? ?Patient will benefit from skilled intervention to decrease level of assist with basic  self-care skills and increase independence with basic self-care skills prior to discharge home with care  partner.  Anticipate patient will require 24 hour supervision and follow up outpatient. ? ?OT - End of Session ?Activity Tolerance: Tolerates 10 - 20 min activity with multiple rests ?Endurance Deficit: Yes ?OT Assessment ?Rehab Potential (ACUTE ONLY): Good ?OT Barriers to Discharge: Incontinence ?OT Patient demonstrates impairments in the following area(s): Balance;Cognition;Endurance;Motor;Perception;Safety;Sensory;Vision ?OT Basic ADL's Functional Problem(s): Grooming;Bathing;Dressing;Toileting ?OT Transfers Functional Problem(s): Toilet;Tub/Shower ?OT Additional Impairment(s): Fuctional Use of Upper Extremity ?OT Plan ?OT Intensity: Minimum of 1-2 x/day, 45 to 90 minutes ?OT Frequency: 5 out of 7 days ?OT Duration/Estimated Length of Stay: 2-2.5 weeks ?OT Treatment/Interventions: Balance/vestibular training;Disease mangement/prevention;Neuromuscular re-education;Self Care/advanced ADL retraining;Therapeutic Exercise;Wheelchair propulsion/positioning;UE/LE Strength taining/ROM;Skin care/wound managment;Pain management;DME/adaptive equipment instruction;Cognitive remediation/compensation;Community reintegration;Functional electrical stimulation;Patient/family education;Splinting/orthotics;UE/LE Coordination activities;Visual/perceptual remediation/compensation;Therapeutic Activities;Psychosocial support;Functional mobility training;Discharge planning ?OT Self Feeding Anticipated Outcome(s): set-up A ?OT Basic Self-Care Anticipated Outcome(s): S to CGA ?OT Toileting Anticipated Outcome(s): CGA ?OT Bathroom Transfers Anticipated Outcome(s): CGA ?OT Recommendation ?Patient destination: Home ?Follow Up Recommendations: Outpatient OT ?Equipment Recommended: To be determined ? ? ?OT Evaluation ?Precautions/Restrictions  ?Precautions ?Precautions: Fall ?Precaution Comments: L hemipareisis/ataxia, L inattention, poor safety awareness ?Restrictions ?Weight Bearing Restrictions: No ?General ?Chart Reviewed: Yes ?Response to  Previous Treatment: Patient with no complaints from previous session ?Family/Caregiver Present: Yes (daughter) ?Vital Signs ?Therapy Vitals ?Temp: 98.2 ?F (36.8 ?C) ?Pulse Rate: 82 ?Resp: 16 ?BP: 109/85 ?Patient Position (if appropriate): Sitting ?Oxygen Therapy ?SpO2: 99 % ?O2 Device: Room Air ?Pain ?Pain Assessment ?Pain Scale: Faces ?Pain Score: 0-No pain ?Home Living/Prior Functioning ?Home Living ?Family/patient expects to be discharged to:: Private residence ?Living Arrangements: Spouse/significant other ?Available Help at Discharge: Family, Available 24 hours/day ?Type of Home: House ?Home Access: Stairs to enter ?Entrance Stairs-Number of Steps: garage 4 steps w/ R HR; front steps 10 steps with Bil railing ?Entrance Stairs-Rails: Can reach both, Right ?Home Layout: Two level, Able to live on main level with bedroom/bathroom ?Alternate Level Stairs-Number of Steps: full flight does not use upstairs ?Alternate Level Stairs-Rails: Can reach both ?Bathroom Shower/Tub: Walk-in shower ?Bathroom Toilet: Handicapped height ?Bathroom Accessibility: Yes ?Additional Comments: indicates use of SPC AND that he completed 18 holes of golf the week prior to stroke. ? Lives With: Spouse ?IADL History ?Homemaking Responsibilities: No ?Occupation: Retired ?Type of Occupation: veteran ?Leisure and Hobbies: golf ?Prior Function ?Level of Independence: Requires assistive device for independence [facility]) ? Able to Take Stairs?: Reciprically ?Driving: No ?Vocation: Retired ?Vision ?Baseline Vision/History: 1 Wears glasses ?Ability to See in Adequate Light: 0 Adequate ?Patient Visual Report: No change from baseline ?Vision Assessment?: Yes ?Eye Alignment: Within Functional Limits ?Ocular Range of Motion: Within Functional Limits ?Alignment/Gaze Preference: Within Defined Limits ?Tracking/Visual Pursuits: Requires cues, head turns, or add eye shifts to track ?Saccades: Within functional limits ?Convergence: Within functional  limits ?Visual Fields: Impaired-to be further tested in functional context ?Perception  ?Perception: Impaired ?Inattention/Neglect: Does not attend to left visual field;Does not attend to left side of body ?Praxis ?Praxis:

## 2022-01-28 NOTE — Discharge Summary (Signed)
Physician Discharge Summary  ?Patient ID: ?Robert Warner ?MRN: [id number] ?DOB/AGE: [birth year] 81 y.o. ? ?Admit date: 01/27/2022 ?Discharge date: 02/09/2022 ? ?Discharge Diagnoses:  ?Principal Problem: ?  CVA (cerebral vascular accident) ([place]) ?Active problems: ?Functional deficits due to right MCA territory stroke ?Hypertension ?Liver metasteses ?Hypertension ?DM-2 ?BPH ? ?Discharged Condition: stable ? ?Significant Diagnostic Studies: ?CT ANGIO HEAD NECK W WO CM ? ?Result Date: 01/24/2022 ?CLINICAL DATA:  Stroke suspected EXAM: CT ANGIOGRAPHY HEAD AND NECK TECHNIQUE: Multidetector CT imaging of the head and neck was performed using the standard protocol during bolus administration of intravenous contrast. Multiplanar CT image reconstructions and MIPs were obtained to evaluate the vascular anatomy. Carotid stenosis measurements (when applicable) are obtained utilizing NASCET criteria, using the distal internal carotid diameter as the denominator. RADIATION DOSE REDUCTION: This exam was performed according to the departmental dose-optimization program which includes automated exposure control, adjustment of the mA and/or kV according to patient size and/or use of iterative reconstruction technique. CONTRAST:  41m OMNIPAQUE IOHEXOL 350 MG/ML SOLN COMPARISON:  CT head 01/23/2022, CTA 12/04/2021. FINDINGS: CT HEAD FINDINGS Brain: No evidence of acute infarction, hemorrhage, cerebral edema, mass, mass effect, or midline shift. No hydrocephalus or extra-axial fluid collection. Periventricular white matter changes, likely the sequela of chronic small vessel ischemic disease. Remote lacunar infarcts in the right thalamus, bilateral basal ganglia, and bilateral cerebellar hemispheres. Vascular: No hyperdense vessel. Skull: Normal. Negative for fracture or focal lesion. Sinuses/Orbits: No acute finding. Other: The mastoid air cells are well aerated. CTA NECK FINDINGS Aortic arch: Two-vessel arch with a common origin of the  brachiocephalic and left common carotid arteries. Imaged portion shows no evidence of aneurysm or dissection. No significant stenosis of the major arch vessel origins. Right carotid system: No evidence of dissection, stenosis (50% or greater) or occlusion. Left carotid system: No evidence of dissection, stenosis (50% or greater) or occlusion. Vertebral arteries: No evidence of dissection, stenosis (50% or greater) or occlusion. Skeleton: No acute osseous abnormality. Degenerative changes in the cervical spine. Other neck: No acute finding. Upper chest: Right upper lobe nodule measures up to 11 x 12 mm in the axial plane (series 8, image 681), previously 8 x 9 mm when remeasured similarly. Other nodular opacity superior to this mass are also unchanged (series 8, image 653). No pleural effusion. Review of the MIP images confirms the above findings CTA HEAD FINDINGS Anterior circulation: Both internal carotid arteries are patent to the termini, without significant stenosis. Hypoplastic right A1. A1 segments patent. Normal anterior communicating artery. Anterior cerebral arteries are patent to their distal aspects. No M1 stenosis or occlusion. Normal MCA bifurcations. Distal MCA branches perfused and symmetric. Posterior circulation: Vertebral arteries patent to the vertebrobasilar junction without stenosis. Posterior inferior cerebral arteries patent bilaterally. Basilar patent to its distal aspect. Superior cerebellar arteries patent bilaterally. Patent P1 segments. PCAs perfused to their distal aspects without stenosis. The left posterior communicating artery is not visualized. The right posterior communicating artery is patent. Venous sinuses: As permitted by contrast timing, patent. Anatomic variants: None significant. Review of the MIP images confirms the above findings IMPRESSION: 1.  No intracranial large vessel occlusion or significant stenosis. 2.  No hemodynamically significant stenosis in the neck. 3.  Interval increase in the size of the right upper lobe nodule, which now measures up to 12 mm, previously 9 mm on 12/04/2021. Electronically Signed   By: AMerilyn BabaM.D.   On: 01/24/2022 00:21  ? ?CT HEAD WO CONTRAST ? ?Result Date: 01/23/2022 ?  CLINICAL DATA:  Patient feels off balance and weak since waking up. EXAM: CT HEAD WITHOUT CONTRAST TECHNIQUE: Contiguous axial images were obtained from the base of the skull through the vertex without intravenous contrast. RADIATION DOSE REDUCTION: This exam was performed according to the departmental dose-optimization program which includes automated exposure control, adjustment of the mA and/or kV according to patient size and/or use of iterative reconstruction technique. COMPARISON:  12/05/2021 FINDINGS: Brain: No evidence of acute infarction, hemorrhage, hydrocephalus, extra-axial collection or mass lesion/mass effect. Remote lacunar infarcts are identified within bilateral basal ganglia, right thalamus and bilateral cerebellar hemispheres. There is diffuse low-attenuation within the subcortical and periventricular white matter compatible with chronic microvascular disease. Prominence of sulci and ventricles compatible with brain atrophy. Vascular: No hyperdense vessel or unexpected calcification. Skull: Normal. Negative for fracture or focal lesion. Sinuses/Orbits: No acute finding. Other: None IMPRESSION: 1. No acute intracranial abnormalities. 2. Chronic small vessel ischemic disease and brain atrophy. 3. Remote lacunar infarcts within bilateral basal ganglia, right thalamus and bilateral cerebellar hemispheres. Electronically Signed   By: Kerby Moors M.D.   On: 01/23/2022 07:32  ? ?MR BRAIN W WO CONTRAST ? ?Result Date: 01/24/2022 ?CLINICAL DATA:  Neuro deficit, stroke suspected, weakness and off balance since waking this morning EXAM: MRI HEAD WITHOUT AND WITH CONTRAST TECHNIQUE: Multiplanar, multiecho pulse sequences of the brain and surrounding structures were  obtained without and with intravenous contrast. CONTRAST:  38m GADAVIST GADOBUTROL 1 MMOL/ML IV SOLN COMPARISON:  MRI head 12/05/2021, correlation is made with CT and CTA head 01/23/2022 FINDINGS: Evaluation is somewhat limited by motion artifact. Brain: Numerous mostly punctate foci of restricted diffusion in the right-greater-than-left cerebral hemispheres (series 5, images 82, 88-105, 108) and left cerebellar hemisphere (series 5, images 68 and 75). The largest area of restricted diffusion is in the posterior right frontal lobe (series 5, image 94). No acute hemorrhage, mass, mass effect, or midline shift. Confluent T2 hyperintense signal in the periventricular white matter and pons, likely the sequela of moderate to severe chronic small vessel ischemic disease. Lacunar infarcts in the right thalamus and bilateral cerebellar hemispheres. Hemosiderin deposition from remote petechial hemorrhage in the left-greater-than-right cerebellum, bilateral thalami, and left occipital lobe. No hydrocephalus or extra-axial collection. Area of enhancement in the left inferior cerebellum (series 20, image 13), favored to represent a subacute infarct. No other parenchymal enhancement. Vascular: Normal flow voids. Skull and upper cervical spine: Redemonstrated heterogeneous marrow signal. Degenerative changes in the cervical spine. Sinuses/Orbits: No acute finding. Status post bilateral lens replacements. Other: The mastoids are well aerated. IMPRESSION: 1. Multiple, mostly punctate acute infarcts in the right-greater-than-left cerebral hemispheres and left cerebellar hemisphere, with a larger area of infarction in the posterior right frontal lobe. Given multiple vascular territories an embolic etiology is favored. 2. Area of enhancement in the left cerebellar hemisphere, which is favored to represent a subacute infarct. Electronically Signed   By: AMerilyn BabaM.D.   On: 01/24/2022 01:20  ? ?CT CHEST ABDOMEN PELVIS W  CONTRAST ? ?Result Date: 01/25/2022 ?CLINICAL DATA:  Restaging metastatic carcinoma of unknown primary. * Tracking Code: BO * EXAM: CT CHEST, ABDOMEN, AND PELVIS WITH CONTRAST TECHNIQUE: Multidetector CT imaging of the ch

## 2022-01-28 NOTE — Progress Notes (Signed)
? ?                                                       PROGRESS NOTE ? ? ?Subjective/Complaints: ? ?Pt reports ready for therapy- LBM yesterday.  ?Has port accessed in R chest.  ? ?Per wife, would like SLP eval if OT thinks it's needed- she feels higher level cognition is an issue.  ? ?ROS: ? ?Pt denies SOB, abd pain, CP, N/V/C/D, and vision changes ? ? ?Objective: ?  ?No results found. ?Recent Labs  ?  01/28/22 ?0339  ?WBC 4.3  ?HGB 11.4*  ?HCT 34.7*  ?PLT 185  ? ?Recent Labs  ?  01/28/22 ?0339  ?NA 137  ?K 4.1  ?CL 102  ?CO2 27  ?GLUCOSE 104*  ?BUN 19  ?CREATININE 1.14  ?CALCIUM 9.0  ? ? ?Intake/Output Summary (Last 24 hours) at 01/28/2022 1103 ?Last data filed at 01/28/2022 0740 ?Gross per 24 hour  ?Intake 417 ml  ?Output 400 ml  ?Net 17 ml  ?  ? ?  ? ?Physical Exam: ?Vital Signs ?Blood pressure (!) 122/93, pulse 81, temperature 98.7 ?F (37.1 ?C), temperature source Oral, resp. rate 14, SpO2 100 %. ? ? ?General: awake, alert, appropriate, sitting up in bed; waiting for therapy; bright affect; NAD ?HENT: conjugate gaze; oropharynx moist ?CV: regular rate; no JVD ?Pulmonary: CTA B/L; no W/R/R- good air movement ?GI: soft, NT, ND, (+)BS ?Psychiatric: appropriate- bright affect ?Neurological: alert- vague ?Musculoskeletal:     ?   General: No swelling.  ?Skin: ?   General: Skin is warm and dry.  ?   Comments: No breakdown  ?Neurological:  ?   Mental Status: He is alert.  ?Motor: LLE: 4/5, LUE 3/5, Right side 5/5. Sensation is intact ? ?Assessment/Plan: ?1. Functional deficits which require 3+ hours per day of interdisciplinary therapy in a comprehensive inpatient rehab setting. ?Physiatrist is providing close team supervision and 24 hour management of active medical problems listed below. ?Physiatrist and rehab team continue to assess barriers to discharge/monitor patient progress toward functional and medical goals ? ?Care Tool: ? ?Bathing ? Bathing activity did not occur: Safety/medical concerns ?   ?   ?  ?   ?Bathing assist   ?  ?  ?Upper Body Dressing/Undressing ?Upper body dressing Upper body dressing/undressing activity did not occur (including orthotics): Safety/medical concerns ?  ?   ?Upper body assist   ?   ?Lower Body Dressing/Undressing ?Lower body dressing ? ? ? Lower body dressing activity did not occur: Safety/medical concerns ?  ? ?  ? ?Lower body assist   ?   ? ?Toileting ?Toileting    ?Toileting assist   ?  ?  ?Transfers ?Chair/bed transfer ? ?Transfers assist ? Chair/bed transfer activity did not occur: Safety/medical concerns ? ?  ?  ?  ?Locomotion ?Ambulation ? ? ?Ambulation assist ? ? Ambulation activity did not occur: Safety/medical concerns ? ?  ?  ?   ? ?Walk 10 feet activity ? ? ?Assist ?   ? ?  ?   ? ?Walk 50 feet activity ? ? ?Assist   ? ?  ?   ? ? ?Walk 150 feet activity ? ? ?Assist   ? ?  ?  ?  ? ?Walk 10 feet on uneven surface  ?activity ? ? ?  Assist   ? ? ?  ?   ? ?Wheelchair ? ? ? ? ?Assist   ?  ?  ? ?  ?   ? ? ?Wheelchair 50 feet with 2 turns activity ? ? ? ?Assist ? ?  ?  ? ? ?   ? ?Wheelchair 150 feet activity  ? ? ? ?Assist ?   ? ? ?   ? ?Blood pressure (!) 122/93, pulse 81, temperature 98.7 ?F (37.1 ?C), temperature source Oral, resp. rate 14, SpO2 100 %. ? ?Medical Problem List and Plan: ?1. Functional deficits secondary to embolic shower of right MCA territory ?            -patient may shower ?            -ELOS/Goals: 10-14 days S ?            -Con't CIR- first day of evaluations- if OT/PT thinks needs SLP, will order.  ?2.  Antithrombotics: ?-DVT/anticoagulation:  Pharmaceutical: Lovenox 120 mg daily ?            -antiplatelet therapy: None ?3. Pain: continue Tylenol, tramadol as needed ? 3/18- pain controlled- con't regimen- took tramadol at home.  ?4. Mood: LCSW to evaluate patient and provide emotional support ?            -antipsychotic agents: N/A ?5. Neuropsych: This patient is capable of making decisions on his own behalf. ?6. Skin/Wound Care: Routine skin care checks ?7.  Fluids/Electrolytes/Nutrition: Routine I's and O's and follow-up chemistries ?8. Hyperlipidemia: Continue Lipitor 40 mg daily ?            -- Increasing dose not recommended secondary to liver cancer ?9: DM-2: Continue sliding scale insulin, Semglee 16 units daily ?- Start metformin '500mg'$  daily.  ?            -- Carb modified diet ?            --On metformin 750 mg at home ? 3/18- just got Metformin this AM- and CBGs look better- con't to monitor for trend.  ?10: BPH: Continue Flomax ?11: UTI: continue cefdinir through weekend ?            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            --follow-up with [name] 3/24 ? 7: Age indeterminate left peroneal vein DVT; diagnosed on 12/05/2021  initially on Eliquis>>now on Lovenox ?14: Prior CVA with history of L>R inta-atrial shunt ?            --breakthrough CVA in 11/2021 ?            --Now on Lovenox, statin for secondary prevention ?  ? ? ?LOS: ?1 days ?A FACE TO FACE EVALUATION WAS PERFORMED ? ?Robert Warner ?01/28/2022, 11:03 AM  ? ? ? ? ?

## 2022-01-29 LAB — GLUCOSE, CAPILLARY
Glucose-Capillary: 100 mg/dL — ABNORMAL HIGH (ref 70–99)
Glucose-Capillary: 161 mg/dL — ABNORMAL HIGH (ref 70–99)
Glucose-Capillary: 152 mg/dL — ABNORMAL HIGH (ref 70–99)
Glucose-Capillary: 170 mg/dL — ABNORMAL HIGH (ref 70–99)

## 2022-01-29 LAB — CULTURE, BLOOD (ROUTINE X 2)
Culture: NO GROWTH
Culture: NO GROWTH

## 2022-01-29 MED ORDER — GLUCERNA SHAKE PO LIQD
237.0000 mL | Freq: Three times a day (TID) | ORAL | Status: DC
Start: 2022-01-29 — End: 2022-02-09
  Administered 2022-01-29 – 2022-02-09 (×29): 237 mL via ORAL

## 2022-01-29 NOTE — Progress Notes (Signed)
? ?                                                       PROGRESS NOTE ? ? ?Subjective/Complaints: ? ?Pt reports keeps getting oatmeal- wants grits- ?Having "hunger pains"- wants glucerna- will order.  ?Also will d/c IV.  ? ? ?ROS: ? ?Pt denies SOB, abd pain, CP, N/V/C/D, and vision changes ? ? ? ?Objective: ?  ?No results found. ?Recent Labs  ?  01/28/22 ?0339  ?WBC 4.3  ?HGB 11.4*  ?HCT 34.7*  ?PLT 185  ? ?Recent Labs  ?  01/28/22 ?0339  ?NA 137  ?K 4.1  ?CL 102  ?CO2 27  ?GLUCOSE 104*  ?BUN 19  ?CREATININE 1.14  ?CALCIUM 9.0  ? ? ?Intake/Output Summary (Last 24 hours) at 01/29/2022 1219 ?Last data filed at 01/29/2022 5681 ?Gross per 24 hour  ?Intake 338.92 ml  ?Output 300 ml  ?Net 38.92 ml  ?  ? ?  ? ?Physical Exam: ?Vital Signs ?Blood pressure 136/84, pulse 91, temperature 98.1 ?F (36.7 ?C), resp. rate 16, SpO2 100 %. ? ? ? ?General: awake, alert, appropriate, laying supine in bed- dislikes breakfast; NAD ?HENT: conjugate gaze; oropharynx moist ?CV: regular rate; no JVD ?Pulmonary: CTA B/L; no W/R/R- good air movement ?GI: soft, NT, ND, (+)BS ?Psychiatric: appropriate- bright affect except when discussing food ?Neurological: vague- alert ?Musculoskeletal:     ?   General: No swelling.  ?Skin: ?   General: Skin is warm and dry.  ?   Comments: No breakdown  ?Neurological:  ?   Mental Status: He is alert.  ?Motor: LLE: 4/5, LUE 3/5, Right side 5/5. Sensation is intact ? ?Assessment/Plan: ?1. Functional deficits which require 3+ hours per day of interdisciplinary therapy in a comprehensive inpatient rehab setting. ?Physiatrist is providing close team supervision and 24 hour management of active medical problems listed below. ?Physiatrist and rehab team continue to assess barriers to discharge/monitor patient progress toward functional and medical goals ? ?Care Tool: ? ?Bathing ? Bathing activity did not occur: Safety/medical concerns ?Body parts bathed by patient: Face, Right arm, Left arm, Chest, Abdomen, Right  upper leg, Left upper leg, Buttocks, Right lower leg, Left lower leg  ?   ?  ?  ?Bathing assist Assist Level: Minimal Assistance - Patient > 75% ?  ?  ?Upper Body Dressing/Undressing ?Upper body dressing Upper body dressing/undressing activity did not occur (including orthotics): Safety/medical concerns ?What is the patient wearing?: Pull over shirt ?   ?Upper body assist Assist Level: Minimal Assistance - Patient > 75% ?   ?Lower Body Dressing/Undressing ?Lower body dressing ? ? ? Lower body dressing activity did not occur: Safety/medical concerns ?What is the patient wearing?: Pants ? ?  ? ?Lower body assist Assist for lower body dressing: Moderate Assistance - Patient 50 - 74% ?   ? ?Toileting ?Toileting    ?Toileting assist Assist for toileting: Moderate Assistance - Patient 50 - 74% ?  ?  ?Transfers ?Chair/bed transfer ? ?Transfers assist ? Chair/bed transfer activity did not occur: Safety/medical concerns ? ?Chair/bed transfer assist level: Minimal Assistance - Patient > 75% ?  ?  ?Locomotion ?Ambulation ? ? ?Ambulation assist ? ? Ambulation activity did not occur: Safety/medical concerns ? ?Assist level: Moderate Assistance - Patient 50 - 74% ?Assistive device: Walker-rolling ?Max  distance: 105 ft  ? ?Walk 10 feet activity ? ? ?Assist ?   ? ?Assist level: Minimal Assistance - Patient > 75% ?Assistive device: Walker-rolling  ? ?Walk 50 feet activity ? ? ?Assist   ? ?Assist level: Moderate Assistance - Patient - 50 - 74% ?Assistive device: Walker-rolling  ? ? ?Walk 150 feet activity ? ? ?Assist Walk 150 feet activity did not occur: Safety/medical concerns ? ?  ?  ?  ? ?Walk 10 feet on uneven surface  ?activity ? ? ?Assist Walk 10 feet on uneven surfaces activity did not occur: Safety/medical concerns ? ? ?  ?   ? ?Wheelchair ? ? ? ? ?Assist Is the patient using a wheelchair?: Yes ?Type of Wheelchair: Manual ?  ? ?Wheelchair assist level: Maximal Assistance - Patient 25 - 49%, Total Assistance - Patient <  25% ?Max wheelchair distance: 110 ft  ? ? ?Wheelchair 50 feet with 2 turns activity ? ? ? ?Assist ? ?  ?  ? ? ?Assist Level: Maximal Assistance - Patient 25 - 49%, Total Assistance - Patient < 25%  ? ?Wheelchair 150 feet activity  ? ? ? ?Assist ? Wheelchair 150 feet activity did not occur: Safety/medical concerns ? ? ?   ? ?Blood pressure 136/84, pulse 91, temperature 98.1 ?F (36.7 ?C), resp. rate 16, SpO2 100 %. ? ?Medical Problem List and Plan: ?1. Functional deficits secondary to embolic shower of right MCA territory ?            -patient may shower ?            -ELOS/Goals: 10-14 days S ?            Con't CIR- PT and OT- wife thinks might need SLP- will order for tomorrow ?2.  Antithrombotics: ?-DVT/anticoagulation:  Pharmaceutical: Lovenox 120 mg daily ?            -antiplatelet therapy: None ?3. Pain: continue Tylenol, tramadol as needed ? 3/18- pain controlled- con't regimen- took tramadol at home.  ?4. Mood: LCSW to evaluate patient and provide emotional support ?            -antipsychotic agents: N/A ?5. Neuropsych: This patient is capable of making decisions on his own behalf. ?6. Skin/Wound Care: Routine skin care checks ?7. Fluids/Electrolytes/Nutrition: Routine I's and O's and follow-up chemistries ?8. Hyperlipidemia: Continue Lipitor 40 mg daily ?            -- Increasing dose not recommended secondary to liver cancer ?9: DM-2: Continue sliding scale insulin, Semglee 16 units daily ?- Start metformin '500mg'$  daily.  ?            -- Carb modified diet ?            --On metformin 750 mg at home ? 3/18- just got Metformin this AM- and CBGs look better- con't to monitor for trend.  ? 3/19- CBGs 100-161- only 1 value over 150- con't regimen ?10: BPH: Continue Flomax ?11: UTI: continue cefdinir through weekend ?            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            --follow-up with [name] 3/24 ? 8: Age indeterminate left peroneal vein DVT; diagnosed  on 12/05/2021  initially on Eliquis>>now on Lovenox ?14: Prior CVA with history of L>R inta-atrial shunt ?            --breakthrough CVA  in 11/2021 ?            --Now on Lovenox, statin for secondary prevention ?15. Nutrition ? 3/19- will order glucerna for pt- d/c IV per pt request ?  ? ? ?LOS: ?2 days ?A FACE TO FACE EVALUATION WAS PERFORMED ? ?Stephano Arrants ?01/29/2022, 12:19 PM  ? ? ? ? ?

## 2022-01-30 ENCOUNTER — Encounter: Payer: Self-pay | Admitting: *Deleted

## 2022-01-30 DIAGNOSIS — I63131 Cerebral infarction due to embolism of right carotid artery: Secondary | ICD-10-CM

## 2022-01-30 LAB — GLUCOSE, CAPILLARY
Glucose-Capillary: 105 mg/dL — ABNORMAL HIGH (ref 70–99)
Glucose-Capillary: 190 mg/dL — ABNORMAL HIGH (ref 70–99)
Glucose-Capillary: 108 mg/dL — ABNORMAL HIGH (ref 70–99)
Glucose-Capillary: 165 mg/dL — ABNORMAL HIGH (ref 70–99)

## 2022-01-30 NOTE — Progress Notes (Signed)
Occupational Therapy Session Note ? ?Patient Details  ?Name: Robert Warner ?MRN: [id number] ?Date of Birth: 11-Feb-1941 ? ?Today's Date: 01/30/2022 ?OT Individual Time: 1001-1057 ?OT Individual Time Calculation (min): 56 min  ? ? ?Short Term Goals: ?Week 1:  OT Short Term Goal 1 (Week 1): Pt will don pants min A ?OT Short Term Goal 2 (Week 1): Pt will complete >2 grooming tasks standing at sink with CGA. ?OT Short Term Goal 3 (Week 1): Pt will require no more than min VCs to safely position LUE while seated in chair. ? ?Skilled Therapeutic Interventions/Progress Updates:  ?Skilled OT intervention completed with focus on ADL retraining, LUE NMR. Pt received seated in w/c, agreeable to session. Completed self-shaving with R hand, with electric razor with supervision. Note- pt was a bit preservative during ADL tasks, with occasional cues to attend to next task. Completed teeth brushing with pt utilizing BUE however education provided on using L hand on stable surface to limit ataxic instability during functional tasks, with pt able to self-initiate this method at later point in session. Doffed sweatshirt with min A, then UB bathed with supervision. Education provided about hemi-technique for donning shirt with pt able to complete at supervision level this session. Min A needed for donning hearing aids, as pt had one on table and didn't recognize they weren't in his ears. Pt participated in LUE NMR with yellow theraputty to promote functional use/stability/fine motor control during ADLs. Tasks included rolling in ball, rolling putty into a tube, pincer pinching onto putty, and twirling putty into coil for rotational control. Pt exhibited min difficulty with pinching due to placement control, with instruction provided to use forearm on table for stability, as well as max difficulty motor planning how to coil putty with assist needed from R hand. Pt was encouraged to sit up in chair until bed linens placed on bed (NT  notified), with pt left in w/c, with belt alarm on and all needs in reach on pt's intact side at end of session. ? ? ?Therapy Documentation ?Precautions:  ?Precautions ?Precautions: Fall ?Precaution Comments: L hemipareisis/ataxia, L inattention, poor safety awareness ?Restrictions ?Weight Bearing Restrictions: No ? ?Pain: ?No c/o pain ? ? ?Therapy/Group: Individual Therapy ? ?Durwin Davisson E Chiamaka Latka ?01/30/2022, 7:38 AM ?

## 2022-01-30 NOTE — Progress Notes (Signed)
Physical Therapy Session Note ? ?Patient Details  ?Name: Robert Warner ?MRN: [id number] ?Date of Birth: 28-Nov-1940 ? ?Today's Date: 01/30/2022 ?PT Individual Time: 0737-1062 ?PT Individual Time Calculation (min): 68 min  ? ?Short Term Goals: ?Week 1:  PT Short Term Goal 1 (Week 1): Pt will demonstrate bed mobility with overall CGA. ?PT Short Term Goal 2 (Week 1): Pt will demonstrate consistent CGA with standing transfers while safely using LRAD. ?PT Short Term Goal 3 (Week 1): Pt will ambulate 150 feet consistently using LRAD with MinA. ?PT Short Term Goal 4 (Week 1): Pt will complete at least 4 steps using RHR to ascend with consistent MinA. ? ?Skilled Therapeutic Interventions/Progress Updates:  ? Received pt semi-reclined in bed leaning to the L but very eager for therapy. Pt agreeable to PT treatment and denied any pain during session. Session with emphasis on dressing, functional mobility/transfers, generalized strengthening and endurance, and dynamic standing balance/coordination, gait training, and NMR. Removed dirty brief and donned clean one with max A - pt able to bridge hips to assist. Donned pants in supine with mod A and bridged hips again for therapist to pull over hips. Pt transferred semi-reclined<>siting EOB with supervision and doffed dirty shirt with mod A and donned clean one with min A. Donned shoes with max A - pt initially putting the wrong shoes on the wrong feet. MD present for morning rounds for brief assessment and RN arrived to flush IV. Prepared to transfer into [facility], then pt reported urge to urinate. Stood with RW and min A and required max A for clothing management - cues to keep LUE on RW for safety/stability. With increased time, pt able to void in urinal standing with CGA/min A for balance. RN arrived to administer medication and pt transferred bed<>WC stand<>pivot to L with min A. Pt transported to/from room in [facility] dependently for time management purposes. Sit<>stand with RW and min A  and pt ambulated 28f x 2 and 163fx 2 with RW and min/light mod A - pt ambulates with narrow BOS, mild LLE scissoring, decreased LLE foot clearance, and with mild L lateral lean. Pt required cues for attention, to remain inside RW, to increase L foot clearance, and for attention to LUE on RW handgrip. Pt then performed standing alternating toe taps to cone 2x10 with BUE support and min A for balance with emphasis on coordination. Transitioned to squats 2x10 with BUE support and min A - mod cues for technique. Pt then performed blocked practice sit<>stands x10 reps without UE support and CGA/min A for balance with emphasis on quad/glute strength and weight shifting to L - noted mild L knee hyperextension in stance and provided cues to "soften L knee". Concluded session with pt sitting in WC, needs within reach, and seatbelt alarm on awaiting upcoming OT session.  ? ?Therapy Documentation ?Precautions:  ?Precautions ?Precautions: Fall ?Precaution Comments: L hemipareisis/ataxia, L inattention, poor safety awareness ?Restrictions ?Weight Bearing Restrictions: No ? ?Therapy/Group: Individual Therapy ?AnBlenda NicelyAnBecky SaxT, DPT  ?01/30/2022, 7:12 AM  ?

## 2022-01-30 NOTE — Progress Notes (Signed)
Inpatient Rehabilitation  Patient information reviewed and entered into eRehab system by Jiyaan Steinhauser M. Sherina Stammer, M.A., CCC/SLP, PPS Coordinator.  Information including medical coding, functional ability and quality indicators will be reviewed and updated through discharge.    

## 2022-01-30 NOTE — Plan of Care (Signed)
?  Problem: RH Balance ?Goal: LTG Patient will maintain dynamic standing balance (PT) ?Description: LTG:  Patient will maintain dynamic standing balance with assistance during mobility activities (PT) ?Flowsheets (Taken 01/28/2022 1620) ?LTG: Pt will maintain dynamic standing balance during mobility activities with:: Supervision/Verbal cueing ?  ?Problem: Sit to Stand ?Goal: LTG:  Patient will perform sit to stand with assistance level (PT) ?Description: LTG:  Patient will perform sit to stand with assistance level (PT) ?Flowsheets (Taken 01/28/2022 1620) ?LTG: PT will perform sit to stand in preparation for functional mobility with assistance level: Supervision/Verbal cueing ?  ?Problem: RH Bed Mobility ?Goal: LTG Patient will perform bed mobility with assist (PT) ?Description: LTG: Patient will perform bed mobility with assistance, with/without cues (PT). ?Flowsheets (Taken 01/28/2022 1620) ?LTG: Pt will perform bed mobility with assistance level of: Independent with assistive device  ?  ?Problem: RH Bed to Chair Transfers ?Goal: LTG Patient will perform bed/chair transfers w/assist (PT) ?Description: LTG: Patient will perform bed to chair transfers with assistance (PT). ?Flowsheets (Taken 01/28/2022 1620) ?LTG: Pt will perform Bed to Chair Transfers with assistance level: Contact Guard/Touching assist ?  ?Problem: RH Ambulation ?Goal: LTG Patient will ambulate in controlled environment (PT) ?Description: LTG: Patient will ambulate in a controlled environment, # of feet with assistance (PT). ?Flowsheets (Taken 01/28/2022 1620) ?LTG: Pt will ambulate in controlled environ  assist needed:: Contact Guard/Touching assist ?LTG: Ambulation distance in controlled environment: 150 ft with LRAD ?Goal: LTG Patient will ambulate in home environment (PT) ?Description: LTG: Patient will ambulate in home environment, # of feet with assistance (PT). ?Flowsheets (Taken 01/28/2022 1620) ?LTG: Pt will ambulate in home environ  assist  needed:: Contact Guard/Touching assist ?LTG: Ambulation distance in home environment: at least 50 feet using LRAD ?  ?Problem: RH Wheelchair Mobility ?Goal: LTG Patient will propel w/c in controlled environment (PT) ?Description: LTG: Patient will propel wheelchair in controlled environment, # of feet with assist (PT) ?Flowsheets (Taken 01/28/2022 1620) ?LTG: Pt will propel w/c in controlled environ  assist needed:: Set up assist ?LTG: Propel w/c distance in controlled environment: at least 100 feet ?  ?Problem: RH Stairs ?Goal: LTG Patient will ambulate up and down stairs w/assist (PT) ?Description: LTG: Patient will ambulate up and down # of stairs with assistance (PT) ?Flowsheets (Taken 01/30/2022 1020) ?LTG: Pt will ambulate up/down stairs assist needed:: Contact Guard/Touching assist ?LTG: Pt will  ambulate up and down number of stairs: at least 4 steps using R side HR as per home environment ?  ?Problem: RH Car Transfers ?Goal: LTG Patient will perform car transfers with assist (PT) ?Description: LTG: Patient will perform car transfers with assistance (PT). ?Flowsheets (Taken 01/28/2022 1620) ?LTG: Pt will perform car transfers with assist:: Contact Guard/Touching assist ?  ?

## 2022-01-30 NOTE — Progress Notes (Signed)
PATIENT NAVIGATOR PROGRESS NOTE ? ?Name: [name] ?Date: 01/30/2022 ?MRN: [id number]  ?DOB: 11/15/1940 ? ? ?Reason for visit:  ?Phone call with Mrs Cairns ? ?Comments:  Mrs Steward called to discuss Dr. Sissy Hoff recent hospitalization and understanding the plan going forward, we discussed that Dr Benay Spice is following Dr Jeneen Rinks daily during hospitalization. ?That once he is D/C he will return to cancer center for discussion of salvage chemotherapy options.  She verbalized appreciation.  ? ?She is going to meet with Adelene Amas, LCSW tomorrow here at [facility] ? ?Encouraged her to call with any issues or questions. Active listening and supportive presence offered ? ? ?Time spent counseling/coordinating care: 30-45 minutes ? ?

## 2022-01-30 NOTE — Progress Notes (Signed)
Physical Therapy Session Note ? ?Patient Details  ?Name: Robert Warner ?MRN: [id number] ?Date of Birth: [birth year] ? ?Today's Date: 01/30/2022 ?PT Individual Time: 0973-5329 ?PT Individual Time Calculation (min): 75 min  ? ?Short Term Goals: ?Week 1:  PT Short Term Goal 1 (Week 1): Pt will demonstrate bed mobility with overall CGA. ?PT Short Term Goal 2 (Week 1): Pt will demonstrate consistent CGA with standing transfers while safely using LRAD. ?PT Short Term Goal 3 (Week 1): Pt will ambulate 150 feet consistently using LRAD with MinA. ?PT Short Term Goal 4 (Week 1): Pt will complete at least 4 steps using RHR to ascend with consistent MinA. ? ?Skilled Therapeutic Interventions/Progress Updates:  ?  Pt received seated in bed, agreeable to PT session. No complaints of pain. Bed mobility Supervision. Sit to stand and stand pivot transfer bed to w/c with RW and CGA. Session focus on safe RW management. Sit to stand with CGA to RW during session initially with cues for safe hand placement during transfer with progression to pt recalling safe hand placement without cues. Also applied brightly colored coban to L hand grip on RW due to pt difficulty attending to LUE and ensuring grasp on RW prior to initiating gait. Ambulation through obstacle course in therapy gym with RW and min to mod A at times due to LOB to the L. Pt exhibits poor safety awareness and insight into his deficits and frequently says "I'm fine" while losing his balance to the L and requiring mod A to recover. Blocked practice of sit to stand and transfers to various chairs in therapy gym with RW and min A overall with improvement in safety and RW management following practice. Also placed band of theraband across center of walker for visual cue for body positioning with RW during gait. Pt exhibits improved overall safety with use of RW with theraband but does still require cueing at times for safe management. Ambulation with RW weaving through cones with  min to mod A for balance with max cueing needed at times for safe RW management and for decreased speed. Cone search in therapy gym with focus on pt scanning L visual field for targets, keeping track of number of cones collected, and safe RW management and body positioning when retrieving cones. Pt again requires min to mod cues for safety. Pt requests to return to bed at end of session, Supervision for bed mobility. Pt left seated in bed with needs in reach, bed alarm in place, wife present. ? ?Therapy Documentation ?Precautions:  ?Precautions ?Precautions: Fall ?Precaution Comments: L hemipareisis/ataxia, L inattention, poor safety awareness ?Restrictions ?Weight Bearing Restrictions: No ? ? ? ? ? ?Therapy/Group: Individual Therapy ? ? ?Excell Seltzer, PT, DPT, CSRS ? ?01/30/2022, 5:59 PM  ?

## 2022-01-30 NOTE — Progress Notes (Signed)
Inpatient Rehabilitation Center ?Individual Statement of Services ? ?[name]  ?Date:  01/30/2022 ? ?Welcome to the [facility].  Our goal is to provide you with an individualized program based on your diagnosis and situation, designed to meet your specific needs.  With this comprehensive rehabilitation program, you will be expected to participate in at least 3 hours of rehabilitation therapies Monday-Friday, with modified therapy programming on the weekends. ? ?Your rehabilitation program will include the following services:  Physical Therapy (PT), Occupational Therapy (OT), Speech Therapy (ST), 24 hour per day rehabilitation nursing, Therapeutic Recreaction (TR), Neuropsychology, Care Coordinator, Rehabilitation Medicine, Nutrition Services, and Pharmacy Services ? ?Weekly team conferences will be held on Wednesdays to discuss your progress.  Your Inpatient Rehabilitation Care Coordinator will talk with you frequently to get your input and to update you on team discussions.  Team conferences with you and your family in attendance may also be held. ? ?Expected length of stay: 10-14 Days  Overall anticipated outcome:  Supervision to Min A ? ?Depending on your progress and recovery, your program may change. Your Inpatient Rehabilitation Care Coordinator will coordinate services and will keep you informed of any changes. Your Inpatient Rehabilitation Care Coordinator's name and contact numbers are listed  below. ? ?The following services may also be recommended but are not provided by the [facility]:  ? ?Home Health Rehabiltiation Services ?Outpatient Rehabilitation Services ? ?  ?Arrangements will be made to provide these services after discharge if needed.  Arrangements include referral to agencies that provide these services. ? ?Your insurance has been verified to be:  [employer] ?Your primary doctor is:  [name], MD ? ?Pertinent  information will be shared with your doctor and your insurance company. ? ?Inpatient Rehabilitation Care Coordinator:  Erlene Quan, [place] or ([phone] ? ?Information discussed with and copy given to patient by: [name], 01/30/2022, 10:31 AM    ?

## 2022-01-30 NOTE — Plan of Care (Signed)
?  Problem: Consults ?Goal: RH STROKE PATIENT EDUCATION ?Description: See Patient Education module for education specifics  ?Outcome: Progressing ?  ?Problem: RH BOWEL ELIMINATION ?Goal: RH STG MANAGE BOWEL WITH ASSISTANCE ?Description: STG Manage Bowel with  mod I Assistance. ?Outcome: Progressing ?Goal: RH STG MANAGE BOWEL W/MEDICATION W/ASSISTANCE ?Description: STG Manage Bowel with Medication with mod I  Assistance. ?Outcome: Progressing ?  ?Problem: RH BLADDER ELIMINATION ?Goal: RH STG MANAGE BLADDER WITH ASSISTANCE ?Description: STG Manage Bladder With mod I Assistance ?Outcome: Progressing ?Goal: RH STG MANAGE BLADDER WITH MEDICATION WITH ASSISTANCE ?Description: STG Manage Bladder With Medication With  mod I Assistance. ?Outcome: Progressing ?  ?Problem: RH SAFETY ?Goal: RH STG ADHERE TO SAFETY PRECAUTIONS W/ASSISTANCE/DEVICE ?Description: STG Adhere to Safety Precautions With cues Assistance/Device. ?Outcome: Progressing ?  ?Problem: RH PAIN MANAGEMENT ?Goal: RH STG PAIN MANAGED AT OR BELOW PT'S PAIN GOAL ?Description: At or below level 4 w prns ?Outcome: Progressing ?  ?Problem: RH KNOWLEDGE DEFICIT ?Goal: RH STG INCREASE KNOWLEDGE OF DIABETES ?Description: Patient and wife will be able to manage DM with medications and dietary modifications using handouts and educational resources independently ?Outcome: Progressing ?Goal: RH STG INCREASE KNOWLEGDE OF HYPERLIPIDEMIA ?Description: Patient and wife will be able to manage HLD with medications and dietary modifications using handouts and educational resources independently ?Outcome: Progressing ?Goal: RH STG INCREASE KNOWLEDGE OF STROKE PROPHYLAXIS ?Description: Patient and wife will be able to manage DVT/secondary risk with medications and dietary modifications using handouts and educational resources independently ?Outcome: Progressing ?  ?

## 2022-01-30 NOTE — Progress Notes (Addendum)
? ?                                                       PROGRESS NOTE ? ? ?Subjective/Complaints: ?No issues overnite , pt is retired Animal nutritionist , we discussed etioloigy of CVA  ? ?ROS: ? ?Pt denies SOB, abd pain, CP, N/V/C/D, and vision changes ? ? ? ?Objective: ?  ?No results found. ?Recent Labs  ?  01/28/22 ?0339  ?WBC 4.3  ?HGB 11.4*  ?HCT 34.7*  ?PLT 185  ? ? ?Recent Labs  ?  01/28/22 ?0339  ?NA 137  ?K 4.1  ?CL 102  ?CO2 27  ?GLUCOSE 104*  ?BUN 19  ?CREATININE 1.14  ?CALCIUM 9.0  ? ? ? ?Intake/Output Summary (Last 24 hours) at 01/30/2022 9476 ?Last data filed at 01/29/2022 1300 ?Gross per 24 hour  ?Intake 357 ml  ?Output --  ?Net 357 ml  ? ?  ? ?  ? ?Physical Exam: ?Vital Signs ?Blood pressure 122/88, pulse 82, temperature 98.3 ?F (36.8 ?C), resp. rate 14, SpO2 98 %. ? ? ?General: No acute distress ?Mood and affect are appropriate ?Heart: Regular rate and rhythm no rubs murmurs or extra sounds ?Lungs: Clear to auscultation, breathing unlabored, no rales or wheezes ?Abdomen: Positive bowel sounds, soft nontender to palpation, nondistended ?Extremities: No clubbing, cyanosis, or edema ? ? ?Musculoskeletal:     ?   General: No swelling.  ?Skin: ?   General: Skin is warm and dry.  ?   Comments: No breakdown  ?Neurological:  ?   Mental Status: He is alert.  ?Motor: LLE: 4/5, LUE 3/5, Right side 5/5. Sensation is intact ? ?Assessment/Plan: ?1. Functional deficits which require 3+ hours per day of interdisciplinary therapy in a comprehensive inpatient rehab setting. ?Physiatrist is providing close team supervision and 24 hour management of active medical problems listed below. ?Physiatrist and rehab team continue to assess barriers to discharge/monitor patient progress toward functional and medical goals ? ?Care Tool: ? ?Bathing ? Bathing activity did not occur: Safety/medical concerns ?Body parts bathed by patient: Face, Right arm, Left arm, Chest, Abdomen, Right upper leg, Left upper leg, Buttocks, Right lower  leg, Left lower leg  ?   ?  ?  ?Bathing assist Assist Level: Minimal Assistance - Patient > 75% ?  ?  ?Upper Body Dressing/Undressing ?Upper body dressing Upper body dressing/undressing activity did not occur (including orthotics): Safety/medical concerns ?What is the patient wearing?: Pull over shirt ?   ?Upper body assist Assist Level: Minimal Assistance - Patient > 75% ?   ?Lower Body Dressing/Undressing ?Lower body dressing ? ? ? Lower body dressing activity did not occur: Safety/medical concerns ?What is the patient wearing?: Pants ? ?  ? ?Lower body assist Assist for lower body dressing: Moderate Assistance - Patient 50 - 74% ?   ? ?Toileting ?Toileting    ?Toileting assist Assist for toileting: Moderate Assistance - Patient 50 - 74% ?  ?  ?Transfers ?Chair/bed transfer ? ?Transfers assist ? Chair/bed transfer activity did not occur: Safety/medical concerns ? ?Chair/bed transfer assist level: Minimal Assistance - Patient > 75% ?  ?  ?Locomotion ?Ambulation ? ? ?Ambulation assist ? ? Ambulation activity did not occur: Safety/medical concerns ? ?Assist level: Moderate Assistance - Patient 50 - 74% ?Assistive device: Walker-rolling ?Max distance: 105 ft  ? ?  Walk 10 feet activity ? ? ?Assist ?   ? ?Assist level: Minimal Assistance - Patient > 75% ?Assistive device: Walker-rolling  ? ?Walk 50 feet activity ? ? ?Assist   ? ?Assist level: Moderate Assistance - Patient - 50 - 74% ?Assistive device: Walker-rolling  ? ? ?Walk 150 feet activity ? ? ?Assist Walk 150 feet activity did not occur: Safety/medical concerns ? ?  ?  ?  ? ?Walk 10 feet on uneven surface  ?activity ? ? ?Assist Walk 10 feet on uneven surfaces activity did not occur: Safety/medical concerns ? ? ?  ?   ? ?Wheelchair ? ? ? ? ?Assist Is the patient using a wheelchair?: Yes ?Type of Wheelchair: Manual ?  ? ?Wheelchair assist level: Maximal Assistance - Patient 25 - 49%, Total Assistance - Patient < 25% ?Max wheelchair distance: 110 ft  ? ? ?Wheelchair  50 feet with 2 turns activity ? ? ? ?Assist ? ?  ?  ? ? ?Assist Level: Maximal Assistance - Patient 25 - 49%, Total Assistance - Patient < 25%  ? ?Wheelchair 150 feet activity  ? ? ? ?Assist ? Wheelchair 150 feet activity did not occur: Safety/medical concerns ? ? ?   ? ?Blood pressure 122/88, pulse 82, temperature 98.3 ?F (36.8 ?C), resp. rate 14, SpO2 98 %. ? ?Medical Problem List and Plan: ?1. Functional deficits secondary to embolic shower of right MCA territory- embolic , hypercoag state due to  ?            -patient may shower ?            -ELOS/Goals: 10-14 days S ?            Con't CIR- PT and OT- wife thinks might need SLP- will order for tomorrow ?2.  Antithrombotics: ?-DVT/anticoagulation:  Pharmaceutical: Lovenox 120 mg daily- will clarify with Heme -Onc of pt will be going home on this med  or try DOAC ?            -antiplatelet therapy: None ?3. Pain: continue Tylenol, tramadol as needed ? 3/18- pain controlled- con't regimen- took tramadol at home.  ?4. Mood: LCSW to evaluate patient and provide emotional support ?            -antipsychotic agents: N/A ?5. Neuropsych: This patient is capable of making decisions on his own behalf. ?6. Skin/Wound Care: Routine skin care checks ?7. Fluids/Electrolytes/Nutrition: Routine I's and O's and follow-up chemistries ?8. Hyperlipidemia: Continue Lipitor 40 mg daily ?            -- Increasing dose not recommended secondary to liver cancer ?9: DM-2: Continue sliding scale insulin, Semglee 16 units daily ?- Start metformin '500mg'$  daily.  ?            -- Carb modified diet ?            --On metformin 750 mg at home ?CBG (last 3)  ?Recent Labs  ?  01/29/22 ?1642 01/29/22 ?2107 01/30/22 ?0608  ?GLUCAP 152* 170* 105*  ?Controlled 3/20 ?n ?10: BPH: Continue Flomax ?11: UTI: continue cefdinir through weekend ?            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            --follow-up with [name] 3/24 ? 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            --breakthrough CVA in 11/2021 ?            --Now on Lovenox, statin for secondary prevention ?15. Nutrition ? 3/19- will order glucerna for pt- d/c IV per pt request ?  ? ? ?LOS: ?3 days ?A FACE TO FACE EVALUATION WAS PERFORMED ? ?[name] Nadea Kirkland ?01/30/2022, 8:12 AM  ? ? ? ? ?

## 2022-01-30 NOTE — IPOC Note (Signed)
Overall Plan of Care (IPOC) ?Patient Details ?Name: Robert Warner ?MRN: [id number] ?DOB: 01-12-1941 ? ?Admitting Diagnosis: CVA (cerebral vascular accident) ([place]) ? ?Hospital Problems: Principal Problem: ?  CVA (cerebral vascular accident) ([place]) ? ? ? ? Functional Problem List: ?Nursing Medication Management, Safety, Bladder, Bowel, Endurance, Pain  ?PT Balance, Behavior, Endurance, Motor, Nutrition, Perception, Safety, Sensory  ?OT Balance, Cognition, Endurance, Motor, Perception, Safety, Sensory, Vision  ?SLP    ?TR    ?    ? Basic ADL?s: ?OT Grooming, Bathing, Dressing, Toileting  ? ?  Advanced  ADL?s: ?OT    ?   ?Transfers: ?PT Bed Mobility, Bed to Chair, Car, Furniture  ?OT Toilet, Tub/Shower  ? ?  Locomotion: ?PT Ambulation, Stairs, Wheelchair Mobility  ? ?  Additional Impairments: ?OT Fuctional Use of Upper Extremity  ?SLP   ?  ?   ?TR    ? ? ?Anticipated Outcomes ?Item Anticipated Outcome  ?Self Feeding set-up A  ?Swallowing ?   ?  ?Basic self-care ? S to CGA  ?Toileting ? CGA ?  ?Bathroom Transfers CGA  ?Bowel/Bladder ? Manage bowel and bladder w mod I assist  ?Transfers ? supervision  ?Locomotion ? CGA  ?Communication ?    ?Cognition ?    ?Pain ? pain at or below level 4 w prn  ?Safety/Judgment ? maintain safety w cues  ? ?Therapy Plan: ?PT Intensity: Minimum of 1-2 x/day ,45 to 90 minutes ?PT Frequency: 5 out of 7 days ?PT Duration Estimated Length of Stay: 14-18 days ?OT Intensity: Minimum of 1-2 x/day, 45 to 90 minutes ?OT Frequency: 5 out of 7 days ?OT Duration/Estimated Length of Stay: 2-2.5 weeks ?   ? ?Due to the current state of emergency, patients may not be receiving their 3-hours of Medicare-mandated therapy. ? ? Team Interventions: ?Nursing Interventions Disease Management/Prevention, Bladder Management, Medication Management, Discharge Planning, Pain Management, Bowel Management, Patient/Family Education  ?PT interventions Ambulation/gait training, Training and development officer, Cognitive  remediation/compensation, Disease management/prevention, Discharge planning, Community reintegration, DME/adaptive equipment instruction, Functional electrical stimulation, Functional mobility training, Patient/family education, Neuromuscular re-education, Pain management, Psychosocial support, Skin care/wound management, Splinting/orthotics, Therapeutic Exercise, Therapeutic Activities, Stair training, UE/LE Strength taining/ROM, UE/LE Coordination activities, Visual/perceptual remediation/compensation, Wheelchair propulsion/positioning  ?OT Interventions Balance/vestibular training, Disease mangement/prevention, Neuromuscular re-education, Self Care/advanced ADL retraining, Therapeutic Exercise, Wheelchair propulsion/positioning, UE/LE Strength taining/ROM, Skin care/wound managment, Pain management, DME/adaptive equipment instruction, Cognitive remediation/compensation, Community reintegration, Functional electrical stimulation, Patient/family education, Splinting/orthotics, UE/LE Coordination activities, Visual/perceptual remediation/compensation, Therapeutic Activities, Psychosocial support, Functional mobility training, Discharge planning  ?SLP Interventions    ?TR Interventions    ?SW/CM Interventions Discharge Planning, Psychosocial Support, Patient/Family Education, Disease Management/Prevention  ? ?Barriers to Discharge ?MD  Medical stability  ?Nursing Decreased caregiver support, Home environment access/layout ?2 level  6ste garage/10 ste via front w rails, main B+B w spouse  ?PT Inaccessible home environment, Decreased caregiver support, Home environment access/layout, Incontinence, Insurance for SNF coverage, Pending chemo/radiation, Nutrition means ?   ?OT Incontinence ?   ?SLP   ?   ?SW Lack of/limited family support, Home environment Child psychotherapist, Insurance for SNF coverage ?   ? ?Team Discharge Planning: ?Destination: PT-Home ,OT- Home , SLP-  ?Projected Follow-up: PT-Home health PT, Outpatient PT,  OT-  Outpatient OT, SLP-  ?Projected Equipment Needs: PT-To be determined, OT- To be determined, SLP-  ?Equipment Details: PT- , OT-  ?Patient/family involved in discharge planning: PT- Patient,  OT-Patient, Family member/caregiver, SLP-  ? ?MD ELOS: 10-14d ?Medical Rehab Prognosis:  Excellent ?Assessment:  The patient has been admitted for CIR therapies with the diagnosis of RIght MCA infarct. The team will be addressing functional mobility, strength, stamina, balance, safety, adaptive techniques and equipment, self-care, bowel and bladder mgt, patient and caregiver education, diabetic management, HTN management . Goals have been [facility] at [facility]. Anticipated discharge destination is Home. ? ?Due to the current state of emergency, patients may not be receiving their 3 hours per day of Medicare-mandated therapy.  ? ? ?See Team Conference Notes for weekly updates to the plan of care  ?

## 2022-01-30 NOTE — Progress Notes (Signed)
Inpatient Rehabilitation Care Coordinator ?Assessment and Plan ?Patient Details  ?Name: Robert Warner ?MRN: [id number] ?Date of Birth: May 30, 1941 ? ?Today's Date: 01/30/2022 ? ?Hospital Problems: Principal Problem: ?  CVA (cerebral vascular accident) ([place]) ? ?Past Medical History:  ?Past Medical History:  ?Diagnosis Date  ? Arthritis   ? Cancer [facility])   ? Cataract   ? Diabetes mellitus   ? Hyperlipidemia   ? Stroke [facility])   ? ?Past Surgical History:  ?Past Surgical History:  ?Procedure Laterality Date  ? APPENDECTOMY  1968  ? CATARACT EXTRACTION Bilateral 07/2000  ? states 2 weeks ago (first of sept) OD due in Dec   ? COLONOSCOPY  2004,2009  ? Negative, Dr. Sharlett Iles   ? IR IMAGING GUIDED PORT INSERTION  10/14/2021  ? KIDNEY STONE SURGERY  10/2018  ? PROSTATE BIOPSY  2006  ? Dr.Sigmund Tannebaum  ? ?Social History:  reports that he quit smoking about 55 years ago. His smoking use included cigarettes. He has a 5.00 pack-year smoking history. He has never used smokeless tobacco. He reports that he does not currently use alcohol after a past usage of about 2.0 standard drinks per week. He reports that he does not use drugs. ? ?Family / Support Systems ?Spouse/Significant Other: Carolyne ?Children: Anessa ?Anticipated Caregiver: spouse ?Ability/Limitations of Caregiver: none ?Caregiver Availability: 24/7 ?Family Dynamics: support from spouse and daughter ? ?Social History ?Preferred language: English ?Religion: Methodist ?Cultural Background: [place] - How often do you need to have someone help you when you read instructions, pamphlets, or other written material from your doctor or pharmacy?: Never ?Writes: Yes ?Legal History/Current Legal Issues: n/a ?Guardian/Conservator: n/a  ? ?Abuse/Neglect ?Abuse/Neglect Assessment Can Be Completed: Yes ?Physical Abuse: Denies ?Verbal Abuse: Denies ?Sexual Abuse: Denies ?Exploitation of patient/patient's resources: Denies ?Self-Neglect: Denies ? ?Patient response  to: ?Social Isolation - How often do you feel lonely or isolated from those around you?: Never ? ?Emotional Status ?Recent Psychosocial Issues: coping ?Psychiatric History: n/a ?Substance Abuse History: n/a ? ?Patient / Family Perceptions, Expectations & Goals ?Premorbid pt/family roles/activities: previously MOD I W/ Cane  requiring assistance with ADLS and Cognitive Tasks ?Pt/family expectations/goals: Supevision to Melissa ? ?Community Resources ?Community Agencies: None ?Premorbid Home Care/DME Agencies: Other (Comment) [employer], [employer], [employer], [employer], [place]) ?Transportation available at discharge: spouse able to transport ?Is the patient able to respond to transportation needs?: Yes ?In the past 12 months, has lack of transportation kept you from medical appointments or from getting medications?: No ?In the past 12 months, has lack of transportation kept you from meetings, work, or from getting things needed for daily living?: No ?Resource referrals recommended: Neuropsychology ? ?Discharge Planning ?Living Arrangements: Spouse/significant other ?Support Systems: Spouse/significant other, Children ?Type of Residence: Private residence ?Insurance Resources: Multimedia programmer (specify) ([employer] Medicare) ?Financial Resources: Family Support, SSD ?Financial Screen Referred: No ?Living Expenses: Lives with family ?Money Management: Spouse ?Does the patient have any problems obtaining your medications?: No ?Care Coordinator Barriers to Discharge: Lack of/limited family support, Home environment Child psychotherapist, Insurance for SNF coverage ?Care Coordinator Anticipated Follow Up Needs: HH/OP ?Expected length of stay: 10-14 days ? ?Clinical Impression ?SW met with patient and spouse. Introduced self and explained role Patient plans to discharge home with spouse. No additional questions or concerns, sw will continue to follow up ? ?[name] ?01/30/2022, 1:12 PM ? ?  ?

## 2022-01-31 ENCOUNTER — Encounter: Payer: Self-pay | Admitting: Licensed Clinical Social Worker

## 2022-01-31 LAB — GLUCOSE, CAPILLARY
Glucose-Capillary: 104 mg/dL — ABNORMAL HIGH (ref 70–99)
Glucose-Capillary: 130 mg/dL — ABNORMAL HIGH (ref 70–99)
Glucose-Capillary: 119 mg/dL — ABNORMAL HIGH (ref 70–99)
Glucose-Capillary: 133 mg/dL — ABNORMAL HIGH (ref 70–99)

## 2022-01-31 MED ORDER — HEPARIN SOD (PORK) LOCK FLUSH 100 UNIT/ML IV SOLN
500.0000 [IU] | INTRAVENOUS | Status: DC | PRN
  Administered 2022-01-31: 500 [IU]
  Filled 2022-01-31 (×2): qty 5

## 2022-01-31 MED ORDER — HEPARIN SOD (PORK) LOCK FLUSH 100 UNIT/ML IV SOLN
500.0000 [IU] | INTRAVENOUS | Status: DC
  Filled 2022-01-31: qty 5

## 2022-01-31 NOTE — Evaluation (Signed)
Speech Language Pathology Assessment and Plan ? ?Patient Details  ?Name: Robert Warner ?MRN: [id number] ?Date of Birth: Dec 03, 1940 ? ?SLP Diagnosis: Cognitive Impairments  ?Rehab Potential: Fair ?ELOS: 2-2.5 weeks  ? ?Today's Date: 01/31/2022 ?SLP Individual Time: 1100-1200 ?SLP Individual Time Calculation (min): 60 min ? ?Hospital Problem: Principal Problem: ?  CVA (cerebral vascular accident) ([place]) ? ?Past Medical History:  ?Past Medical History:  ?Diagnosis Date  ? Arthritis   ? Cancer [facility])   ? Cataract   ? Diabetes mellitus   ? Hyperlipidemia   ? Stroke [facility])   ? ?Past Surgical History:  ?Past Surgical History:  ?Procedure Laterality Date  ? APPENDECTOMY  1968  ? CATARACT EXTRACTION Bilateral 07/2000  ? states 2 weeks ago (first of sept) OD due in Dec   ? COLONOSCOPY  2004,2009  ? Negative, Dr. Sharlett Iles   ? IR IMAGING GUIDED PORT INSERTION  10/14/2021  ? KIDNEY STONE SURGERY  10/2018  ? PROSTATE BIOPSY  2006  ? Dr.Sigmund Tannebaum  ? ? ?Assessment / Plan / Recommendation ?Clinical Impression Robert Warner is an 81 year old male who presented to [facility] on 01/23/2022 with an approximately 1 to 2-hour history of left-sided weakness associated with imbalance.  He states he stumbled but did not fall or injure himself.  He was deemed out of the window for tPA.  His history is significant for metastatic adenocarcinoma of the liver with unknown primary and lower extremity DVT maintained on Eliquis.  He also has a history of prior CVA with left right intra-atrial shunt maintained on Plavix. CT head reveals multiple old strokes involving bilateral basal ganglia and bilateral cerebral hemispheres and right thalamus. Patient transferred to [facility] and admitted by hospitalist service.  Neurology consulted and Dr. Rosalin Hawking evaluated the patient on 3/14. Admission MRI showed embolic shower the largest of the right MCA territory.  CT head and neck no significant vascular stenosis.  Etiology  for embolic stroke again likely due to hypercoagulable state in the setting of advanced cancer. Patient related he has been compliant with Eliquis at home, indicating Eliquis failure at this time.  The patient has stopped chemotherapy due to intolerance and plans to follow-up with [name] with abdominal ultrasound evaluation pending.  Imaging revealed increase in size of right upper lobe lung nodule and concern for continued malignancy progression. The patient requires inpatient medicine and rehabilitation evaluations and services for ongoing dysfunction secondary to bilateral right greater than left cerebral punctate infarcts and left cerebellar infarct. ? ?Patient demonstrates cognitive impairments characterized by decreased sustained attention, functional problem solving, executive functions, recall of novel information, and intellectual awareness which impacts his safety with functional and familiar tasks. Deficits appear moderate in nature. Spouse was present and endorsed baseline cognitive deficits from prior strokes in the areas of problem solving, awareness, and memory. Spouse stated she has been advocating for an SLP to work with patient since initial CVAs and currently has to assist with all iADLs. Per OT report, daughter told OT that patient appears near cognitive baseline. SLP administered the [facility] Mental Status Examination (SLUMS) and patient scored 14/30 points with a score of 27 or above considered within normal limits. Patient appeared slightly aware of cognitive deficits but did not appear to have awareness of the extent. For example, when pt completed SLUMS assessment he stated "that was a piece of cake" despite having difficulty in many areas. Patient's verbal expression appeared [facility] for all tasks assessed. Patient required verbal repetition  and increased processing time for basic comprehension. Difficult to assess if attributed to hearing loss. Speech was 100% intelligible  at the conversation level. Patient would benefit from skilled SLP intervention to maximize cognitive functioning and overall functional independence prior to discharge. Patient has goals to increase independence with iADLs and decrease caregiver burden. ?  ?Skilled Therapeutic Interventions          Pt participated in [place] Status Examination (SLUMS) as well as further non-standardized assessments of cognitive-linguistic, speech, and language function. Please see above.     ?SLP Assessment ? Patient will need skilled Speech Lanaguage Pathology Services during CIR admission  ?  ?Recommendations ? Patient destination: Home ?Follow up Recommendations: 24 hour supervision/assistance (other TBD) ?Equipment Recommended: None recommended by SLP  ?  ?SLP Frequency 3 to 5 out of 7 days   ?SLP Duration ? ?SLP Intensity ? ?SLP Treatment/Interventions 2-2.5 weeks ? ?Minumum of 1-2 x/day, 30 to 90 minutes ? ?Cognitive remediation/compensation;Environmental controls;Internal/external aids;Functional tasks;Patient/family education;Therapeutic Activities   ? ?Pain ?Pain Assessment ?Pain Scale: 0-10 ?Pain Score: 0-No pain ?Pain Type: Acute pain ?Pain Location: Back ?Pain Descriptors / Indicators: Sore ?Pain Frequency: Rarely ?Pain Intervention(s): Medication (See eMAR);Repositioned ? ?Prior Functioning ?Cognitive/Linguistic Baseline: Baseline deficits ?Baseline deficit details: problem solving, attention, memory deficits from prior CVAs ?Type of Home: House ? Lives With: Spouse ?Available Help at Discharge: Family;Available 24 hours/day ?Education: College ?Vocation: Retired ? ?SLP Evaluation ?Cognition ?Overall Cognitive Status: History of cognitive impairments - at baseline ?Arousal/Alertness: Awake/alert ?Orientation Level: Oriented X4 ?Year: 2023 ?Month: March ?Day of Week: Correct ?Attention: Selective;Sustained ?Sustained Attention: Impaired ?Sustained Attention Impairment: Verbal basic;Functional  basic ?Selective Attention: Impaired ?Selective Attention Impairment: Verbal basic;Functional basic ?Memory: Impaired ?Memory Impairment: Decreased recall of new information;Storage deficit;Retrieval deficit ?Awareness: Impaired ?Awareness Impairment: Intellectual impairment ?Problem Solving: Impaired ?Problem Solving Impairment: Verbal basic;Functional basic ?Safety/Judgment: Impaired  ?Comprehension ?Auditory Comprehension ?Overall Auditory Comprehension: Impaired ?Yes/No Questions: Impaired ?Basic Biographical Questions: 76-100% accurate ?Basic Immediate Environment Questions: 75-100% accurate ?Complex Questions: 50-74% accurate ?Conversation: Simple ?Interfering Components: Attention;Hearing;Processing speed;Working memory ?EffectiveTechniques: Extra processing time;Repetition ?Visual Recognition/Discrimination ?Discrimination: Not tested ?Reading Comprehension ?Reading Status: Not tested ?Expression ?Expression ?Primary Mode of Expression: Verbal ?Verbal Expression ?Overall Verbal Expression: Appears within functional limits for tasks assessed ?Oral Motor ?Oral Motor/Sensory Function ?Overall Oral Motor/Sensory Function: Within functional limits ?Motor Speech ?Overall Motor Speech: Appears within functional limits for tasks assessed ?Articulation: Within functional limitis ?Intelligibility: Intelligible ? ?Care Tool ?Care Tool Cognition ?Ability to hear (with hearing aid or hearing appliances if normally used Ability to hear (with hearing aid or hearing appliances if normally used): 1. Minimal difficulty - difficulty in some environments (e.g. when person speaks softly or setting is noisy) ?  ?Expression of Ideas and Wants Expression of Ideas and Wants: 3. Some difficulty - exhibits some difficulty with expressing needs and ideas (e.g, some words or finishing thoughts) or speech is not clear ?  ?Understanding Verbal and Non-Verbal Content Understanding Verbal and Non-Verbal Content: 3. Usually understands -  understands most conversations, but misses some part/intent of message. Requires cues at times to understand  ?Memory/Recall Ability Memory/Recall Ability : Current season;That he or she is in a hospital/hospit

## 2022-01-31 NOTE — Progress Notes (Signed)
[facility] CSW Progress Note ? ?Clinical Social Worker met with caregiver to follow-up on conversation for adjustment concerns.  Ms. Belluomini stated she was doing well but was having some difficulty adapting to the patient's new care needs.  Patient is currently at [facility] ([facility]) after admission due to stroke.  Patient is currently experiencing left side weakness.  Ms. Guertin said she wanted find ways to manage her impatience with the patient when he insists to do things which require help. CSW discussed different ways to communicate her concerns and how to manage her expectations and using language which reduces judgement and encourages flexibility and support.  CSW and Ms. Gesell spoke about different ways to manage her feelings of disappointment and her own expectations about what recovery for the patient could be.  CSW and patient scheduled appointment 02/07/2022 at 9:00AM. ? ? ? ?Robert Warner , LCSW ?

## 2022-01-31 NOTE — Progress Notes (Signed)
? ?                                                       PROGRESS NOTE ? ? ?Subjective/Complaints: ? ?No issues overnite  ?ROS: ? ?Pt denies SOB, abd pain, CP, N/V/C/D, and vision changes ? ? ? ?Objective: ?  ?No results found. ?No results for input(s): WBC, HGB, HCT, PLT in the last 72 hours. ? ?No results for input(s): NA, K, CL, CO2, GLUCOSE, BUN, CREATININE, CALCIUM in the last 72 hours. ? ? ?Intake/Output Summary (Last 24 hours) at 01/31/2022 0736 ?Last data filed at 01/31/2022 0500 ?Gross per 24 hour  ?Intake 118 ml  ?Output 1450 ml  ?Net -1332 ml  ? ?  ? ?  ? ?Physical Exam: ?Vital Signs ?Blood pressure 130/82, pulse 85, temperature 98.1 ?F (36.7 ?C), resp. rate 18, height '5\' 10"'$  (1.778 m), SpO2 100 %. ? ? ?General: No acute distress ?Mood and affect are appropriate ?Heart: Regular rate and rhythm no rubs murmurs or extra sounds ?Lungs: Clear to auscultation, breathing unlabored, no rales or wheezes ?Abdomen: Positive bowel sounds, soft nontender to palpation, nondistended ?Extremities: No clubbing, cyanosis, or edema ? ? ?Musculoskeletal:     ?   General: No swelling.  ?Skin: ?   General: Skin is warm and dry.  ?   Comments: No breakdown  ?Neurological:  ?   Mental Status: He is alert.  ?Motor: LLE: 4/5, LUE 3/5, Right side 5/5. Sensation is intact ? ?Assessment/Plan: ?1. Functional deficits which require 3+ hours per day of interdisciplinary therapy in a comprehensive inpatient rehab setting. ?Physiatrist is providing close team supervision and 24 hour management of active medical problems listed below. ?Physiatrist and rehab team continue to assess barriers to discharge/monitor patient progress toward functional and medical goals ? ?Care Tool: ? ?Bathing ?   ?Body parts bathed by patient: Right arm, Left arm, Face, Chest  ?   ?  ?  ?Bathing assist Assist Level: Supervision/Verbal cueing ?  ?  ?Upper Body Dressing/Undressing ?Upper body dressing   ?What is the patient wearing?: Pull over shirt ?   ?Upper  body assist Assist Level: Supervision/Verbal cueing ?   ?Lower Body Dressing/Undressing ?Lower body dressing ? ? ?   ?What is the patient wearing?: Pants ? ?  ? ?Lower body assist Assist for lower body dressing: Moderate Assistance - Patient 50 - 74% ?   ? ?Toileting ?Toileting    ?Toileting assist Assist for toileting: Moderate Assistance - Patient 50 - 74% ?  ?  ?Transfers ?Chair/bed transfer ? ?Transfers assist ?   ? ?Chair/bed transfer assist level: Minimal Assistance - Patient > 75% ?  ?  ?Locomotion ?Ambulation ? ? ?Ambulation assist ? ?   ? ?Assist level: Moderate Assistance - Patient 50 - 74% ?Assistive device: Walker-rolling ?Max distance: 78f  ? ?Walk 10 feet activity ? ? ?Assist ?   ? ?Assist level: Moderate Assistance - Patient - 50 - 74% ?Assistive device: Walker-rolling  ? ?Walk 50 feet activity ? ? ?Assist   ? ?Assist level: Moderate Assistance - Patient - 50 - 74% ?Assistive device: Walker-rolling  ? ? ?Walk 150 feet activity ? ? ?Assist Walk 150 feet activity did not occur: Safety/medical concerns ? ?  ?  ?  ? ?Walk 10 feet on uneven surface  ?  activity ? ? ?Assist Walk 10 feet on uneven surfaces activity did not occur: Safety/medical concerns ? ? ?  ?   ? ?Wheelchair ? ? ? ? ?Assist Is the patient using a wheelchair?: Yes ?Type of Wheelchair: Manual ?  ? ?Wheelchair assist level: Maximal Assistance - Patient 25 - 49%, Total Assistance - Patient < 25% ?Max wheelchair distance: 110 ft  ? ? ?Wheelchair 50 feet with 2 turns activity ? ? ? ?Assist ? ?  ?  ? ? ?Assist Level: Maximal Assistance - Patient 25 - 49%, Total Assistance - Patient < 25%  ? ?Wheelchair 150 feet activity  ? ? ? ?Assist ?   ? ? ?Assist Level: Total Assistance - Patient < 25%  ? ?Blood pressure 130/82, pulse 85, temperature 98.1 ?F (36.7 ?C), resp. rate 18, height '5\' 10"'$  (1.778 m), SpO2 100 %. ? ?Medical Problem List and Plan: ?1. Functional deficits secondary to embolic shower of right MCA territory- embolic , hypercoag state due  to  ?            -patient may shower ?            -ELOS/Goals: 10-14 days S ?            Con't CIR- PT and OT- care team conf in am  ?2.  Antithrombotics: ?-DVT/anticoagulation:  Pharmaceutical: Lovenox 120 mg daily- did clarify with Heme -Onc and Neuro pt will be going home on this med   ?Hx of calf DVT  ?            -antiplatelet therapy: None ?3. Pain: continue Tylenol, tramadol as needed ? 3/18- pain controlled- con't regimen- took tramadol at home.  ?4. Mood: LCSW to evaluate patient and provide emotional support ?            -antipsychotic agents: N/A ?5. Neuropsych: This patient is capable of making decisions on his own behalf. ?6. Skin/Wound Care: Routine skin care checks ?7. Fluids/Electrolytes/Nutrition: Routine I's and O's and follow-up chemistries ?8. Hyperlipidemia: Continue Lipitor 40 mg daily ?            -- Increasing dose not recommended secondary to liver cancer ?9: DM-2: Continue sliding scale insulin, Semglee 16 units daily ?- Start metformin '500mg'$  daily.  ?            -- Carb modified diet ?            --On metformin 750 mg at home ?CBG (last 3)  ?Recent Labs  ?  01/30/22 ?1648 01/30/22 ?2114 01/31/22 ?0626  ?GLUCAP 108* 165* 104*  ? ?Controlled 3/21 ? ?10: BPH: Continue Flomax ?11: UTI: continue cefdinir through weekend ?            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            --follow-up with [name] 3/24 ? 35: Age indeterminate left peroneal vein DVT; diagnosed on 12/05/2021  initially on Eliquis>>now on Lovenox ?14: Prior CVA with history of L>R intra-atrial shunt ?            --breakthrough CVA in 11/2021 ?            --Now on Lovenox, statin for secondary prevention ?15. Nutrition ? 3/19- will order glucerna for pt- d/c IV per pt request ?  ? ? ?LOS: ?4 days ?A FACE TO FACE EVALUATION WAS PERFORMED ? ?Robert Warner ?  01/31/2022, 7:36 AM  ? ? ? ? ?

## 2022-01-31 NOTE — Plan of Care (Signed)
?  Problem: RH Problem Solving ?Goal: LTG Patient will demonstrate problem solving for (SLP) ?Description: LTG:  Patient will demonstrate problem solving for basic/complex daily situations with cues  (SLP) ?Flowsheets (Taken 01/31/2022 1601) ?LTG: Patient will demonstrate problem solving for (SLP): Basic daily situations ?LTG Patient will demonstrate problem solving for: Minimal Assistance - Patient > 75% ?  ?Problem: RH Memory ?Goal: LTG Patient will use memory compensatory aids to (SLP) ?Description: LTG:  Patient will use memory compensatory aids to recall biographical/new, daily complex information with cues (SLP) ?Flowsheets (Taken 01/31/2022 1601) ?LTG: Patient will use memory compensatory aids to (SLP): Minimal Assistance - Patient > 75% ?  ?Problem: RH Attention ?Goal: LTG Patient will demonstrate this level of attention during functional activites (SLP) ?Description: LTG:  Patient will will demonstrate this level of attention during functional activites (SLP) ?Flowsheets (Taken 01/31/2022 1601) ?Patient will demonstrate during cognitive/linguistic activities the attention type of: Selective ?Patient will demonstrate this level of attention during cognitive/linguistic activities in: Controlled ?LTG: Patient will demonstrate this level of attention during cognitive/linguistic activities with assistance of (SLP): Supervision ?  ?Problem: RH Awareness ?Goal: LTG: Patient will demonstrate awareness during functional activites type of (SLP) ?Description: LTG: Patient will demonstrate awareness during functional activites type of (SLP) ?Flowsheets (Taken 01/31/2022 1601) ?Patient will demonstrate during cognitive/linguistic activities awareness type of: Emergent ?LTG: Patient will demonstrate awareness during cognitive/linguistic activities with assistance of (SLP): Supervision ?  ?

## 2022-01-31 NOTE — Plan of Care (Signed)
?  Problem: Consults ?Goal: RH STROKE PATIENT EDUCATION ?Description: See Patient Education module for education specifics  ?Outcome: Progressing ?  ?Problem: RH BOWEL ELIMINATION ?Goal: RH STG MANAGE BOWEL WITH ASSISTANCE ?Description: STG Manage Bowel with  mod I Assistance. ?Outcome: Progressing ?Goal: RH STG MANAGE BOWEL W/MEDICATION W/ASSISTANCE ?Description: STG Manage Bowel with Medication with mod I  Assistance. ?Outcome: Progressing ?  ?Problem: RH BLADDER ELIMINATION ?Goal: RH STG MANAGE BLADDER WITH ASSISTANCE ?Description: STG Manage Bladder With mod I Assistance ?Outcome: Progressing ?Goal: RH STG MANAGE BLADDER WITH MEDICATION WITH ASSISTANCE ?Description: STG Manage Bladder With Medication With  mod I Assistance. ?Outcome: Progressing ?  ?Problem: RH SAFETY ?Goal: RH STG ADHERE TO SAFETY PRECAUTIONS W/ASSISTANCE/DEVICE ?Description: STG Adhere to Safety Precautions With cues Assistance/Device. ?Outcome: Progressing ?  ?Problem: RH PAIN MANAGEMENT ?Goal: RH STG PAIN MANAGED AT OR BELOW PT'S PAIN GOAL ?Description: At or below level 4 w prns ?Outcome: Progressing ?  ?Problem: RH KNOWLEDGE DEFICIT ?Goal: RH STG INCREASE KNOWLEDGE OF DIABETES ?Description: Patient and wife will be able to manage DM with medications and dietary modifications using handouts and educational resources independently ?Outcome: Progressing ?Goal: RH STG INCREASE KNOWLEGDE OF HYPERLIPIDEMIA ?Description: Patient and wife will be able to manage HLD with medications and dietary modifications using handouts and educational resources independently ?Outcome: Progressing ?Goal: RH STG INCREASE KNOWLEDGE OF STROKE PROPHYLAXIS ?Description: Patient and wife will be able to manage DVT/secondary risk with medications and dietary modifications using handouts and educational resources independently ?Outcome: Progressing ?  ?

## 2022-01-31 NOTE — Progress Notes (Signed)
Occupational Therapy Session Note ? ?Patient Details  ?Name: Robert Warner ?MRN: [id number] ?Date of Birth: 1941-10-23 ? ?Today's Date: 01/31/2022 ?OT Individual Time: 8472-0721 ?OT Individual Time Calculation (min): 51 min  ? ? ?Short Term Goals: ?Week 1:  OT Short Term Goal 1 (Week 1): Pt will don pants min A ?OT Short Term Goal 2 (Week 1): Pt will complete >2 grooming tasks standing at sink with CGA. ?OT Short Term Goal 3 (Week 1): Pt will require no more than min VCs to safely position LUE while seated in chair. ? ?Skilled Therapeutic Interventions/Progress Updates:  ?  Pt received seated in recliner, no c/o pain, agreeable to therapy. Session focus on self-care retraining, activity tolerance, LUE NMR, transfer retraining in prep for improved ADL/IADL/func mobility performance + decreased caregiver burden. Stand-pivot from recliner with mod A to power up from low surface and no AD.  ? ?Stand-pivot similar manner to and from toilet, mod A to manage clothing over L hip. Continent void of b/b, able to complete seated pericare with close S due to L lean/mild impulsivity.  ? ?Total A w/c transport to and from gym for time management and energy conservation. Completed massed practice of tossing/catching ball + tossing basketball to hoop with LUE to target L attention/ataxia, pt often grabbing therapist hand/arm with poor awareness when reaching for ball.  ? ? Pt noted to not recall password for cell-phone or how to call back sister. ?Pt left seated in recliner with safety belt alarm engaged, call bell in reach, and all immediate needs met.  ? ? ?Therapy Documentation ?Precautions:  ?Precautions ?Precautions: Fall ?Precaution Comments: L hemipareisis/ataxia, L inattention, poor safety awareness ?Restrictions ?Weight Bearing Restrictions: No ? ?Pain: see session note ?  ?ADL: See Care Tool for more details. ? ? ?Therapy/Group: Individual Therapy ? ?Volanda Napoleon MS, OTR/L ? ?01/31/2022, 6:47 AM ?

## 2022-01-31 NOTE — Progress Notes (Signed)
Occupational Therapy Session Note ? ?Patient Details  ?Name: Robert Warner ?MRN: [id number] ?Date of Birth: 1940/12/23 ? ?Today's Date: 01/31/2022 ?OT Individual Time: 3086-5784 ?OT Individual Time Calculation (min): 24 min  ? ? ?Short Term Goals: ?Week 1:  OT Short Term Goal 1 (Week 1): Pt will don pants min A ?OT Short Term Goal 2 (Week 1): Pt will complete >2 grooming tasks standing at sink with CGA. ?OT Short Term Goal 3 (Week 1): Pt will require no more than min VCs to safely position LUE while seated in chair. ? ?Skilled Therapeutic Interventions/Progress Updates:  ?Pt greeted seated in recliner  agreeable to OT intervention. Session focus on BADL reeducation, functional mobility, dynamic standing balance, LUE coordination, bilateral integration tasks and decreasing overall caregiver burden.     ?Pt reports need to void bladder, MIN A for sit>stand and for ambulatory toilet transfer with Rw to bathroom, MIN cues needed for LUE attention and MINA for RW mgmt as pt running into obstacles on L side. Pt completed 3/3 toileting tasks with MOD A needing assist for clothing mgmt. Pt exited bathroom with MIN A, pt stood at sink for hand hygiene with CGA. Pt also completed therapeutic activity of reaching for ADL items at sink and placing items on top of paper towel dispenser to facilitate improvements with LUE coordination. Pt completed task with overall CGA, pt was noted to drop items with LUE d/t ataxia. Pt ambulated back to EOB with pt instructed to fold towels to work on bilateral integration tasks and dynamic standing balance. Pt completed task with CGA for balance but wife reports "that's not the way he folds towels at home."  pt left seated in recliner with alarm belt activated and all needs within reach.                   ?Therapy Documentation ?Precautions:  ?Precautions ?Precautions: Fall ?Precaution Comments: L hemipareisis/ataxia, L inattention, poor safety awareness ?Restrictions ?Weight Bearing  Restrictions: No ? ?Pain: no pain reported during session  ? ?Therapy/Group: Individual Therapy ? ?[name] ?01/31/2022, 3:43 PM ?

## 2022-02-01 ENCOUNTER — Inpatient Hospital Stay: Payer: Medicare HMO

## 2022-02-01 ENCOUNTER — Ambulatory Visit (HOSPITAL_BASED_OUTPATIENT_CLINIC_OR_DEPARTMENT_OTHER): Payer: Medicare HMO

## 2022-02-01 LAB — GLUCOSE, CAPILLARY
Glucose-Capillary: 104 mg/dL — ABNORMAL HIGH (ref 70–99)
Glucose-Capillary: 155 mg/dL — ABNORMAL HIGH (ref 70–99)
Glucose-Capillary: 140 mg/dL — ABNORMAL HIGH (ref 70–99)
Glucose-Capillary: 153 mg/dL — ABNORMAL HIGH (ref 70–99)

## 2022-02-01 MED ORDER — ENOXAPARIN (LOVENOX) PATIENT EDUCATION KIT
PACK | Freq: Once | Status: AC
  Filled 2022-02-01: qty 1

## 2022-02-01 NOTE — Progress Notes (Signed)
Occupational Therapy Session Note ? ?Patient Details  ?Name: Robert Warner ?MRN: [id number] ?Date of Birth: [birth year] ? ?Today's Date: 02/01/2022 ?OT Individual Time: 9390-3009 ?OT Individual Time Calculation (min): 57 min  ? ? ?Short Term Goals: ?Week 1:  OT Short Term Goal 1 (Week 1): Pt will don pants min A ?OT Short Term Goal 2 (Week 1): Pt will complete >2 grooming tasks standing at sink with CGA. ?OT Short Term Goal 3 (Week 1): Pt will require no more than min VCs to safely position LUE while seated in chair. ? ?Skilled Therapeutic Interventions/Progress Updates:  ?  Pt received seated in w/c attempting to go to bathroom, states he knows he has to call for help but still attempting to transfer with safety belt on, no c/o pain, agreeable to therapy. Session focus on self-care retraining, activity tolerance, transfer retraining, functional use of LUE in prep for improved ADL/IADL/func mobility performance + decreased caregiver burden. Ambulatory toilet transfer with CGA to min A for RW management as pt tends to lift it up on his L. CGA for LB clothing management, continent void of bladder. Total a w/c transport to and from gym for time management and energy conservation.   ? ?Pt participated in various therax to promote [place] of LUE and improved static standing balance without UE support including: grasping/placing resistive clothes pins, corn hole. Pt able to retrieve items from behind him on mat table. Noted to occasionally drop items and required assist from R hand to properly position object in L hand.  ? ?Completed 2x5 min on nustep at level 5 resistance, average of 70 steps/min to target LUE resistance training. Reports significant fatigue post activity but required extensive cuing to take therapeutic rest break. Intermittent assist to maintain L grip.  ? ?Pt left seated in recliner with wife present with safety belt alarm engaged, call bell in reach, and all immediate needs met.  ? ? ?Therapy  Documentation ?Precautions:  ?Precautions ?Precautions: Fall ?Precaution Comments: L hemipareisis/ataxia, L inattention, poor safety awareness ?Restrictions ?Weight Bearing Restrictions: No ? ?Pain: no c/o throughout ?  ?ADL: See Care Tool for more details. ? ? ?Therapy/Group: Individual Therapy ? ?Volanda Napoleon MS, OTR/L ? ?02/01/2022, 6:55 AM ?

## 2022-02-01 NOTE — Progress Notes (Signed)
Physical Therapy Session Note ? ?Patient Details  ?Name: Robert Warner ?MRN: [id number] ?Date of Birth: 1941/11/12 ? ?Today's Date: 01/31/2022 ?PT Individual Time:  6389-3734  ?PT Individual Time Calculation (min): 61 min ? ?Short Term Goals: ?Week 1:  PT Short Term Goal 1 (Week 1): Pt will demonstrate bed mobility with overall CGA. ?PT Short Term Goal 2 (Week 1): Pt will demonstrate consistent CGA with standing transfers while safely using LRAD. ?PT Short Term Goal 3 (Week 1): Pt will ambulate 150 feet consistently using LRAD with MinA. ?PT Short Term Goal 4 (Week 1): Pt will complete at least 4 steps using RHR to ascend with consistent MinA. ? ?Skilled Therapeutic Interventions/Progress Updates:  ?Patient supine in bed on entrance to room. Patient alert and agreeable to PT session. RN providing morning medications. Has condom catheter donned. Relates need to toilet.  ? ?Patient with no pain complaint throughout session. ? ?Therapeutic Activity: ?Bed Mobility: Patient performed supine --> sit with CGA and vc for technique and use of L hand. Pants doffed requiring ModA as pt not aware of catheter  and moving with increased potential of accidental removal. Collection bag threaded through pant leg of new pants with pt remarking re: PT's ability to "think ahead". ModA to thread appropriate LE through each pant leg and pt able to don rest of way.  ? ?Transfers: Patient performed sit<>stand and stand pivot transfers throughout session with impulsivity and CGA/ MinA. Provided verbal  and tactile cues for improved technique and proprioception of body position.  ? ?Ambulatory transfer to toilet for pt to complete toileting and to change to new brief wwith ModA. Provided pt with wet washcloth and pt perseverates on washing anterior portion of Bil thighs. Vc to complete rest of upper leg and anterior groin and buttocks in stance.  ? ?Gait Training:  ?Patient ambulated >150 ft using RW with MinA for proximity to walker,  direction, and L foot clearance. Demonstrated flexed forward posture and inability to choose correct direction with verbal instruction for direction. Provided vc/ tc for proximity to walker, upright posture, level gaze, focus on target. ? ?Neuromuscular Re-ed: ?NMR facilitated during session with focus onL attention, use of LUE, standing balance. Pt guided in use of BITS for target acquisition and fine motor use of LUE to hit target. Initial bout performed with LUE and pt completes 2 minutes of program x2 bouts. Best performance with 48.48% accuracy, reaction time of 1.87 sec and hitting 64 targets. One bout with RUE and pt completes with 96.19% accuracy, with 1.19 second reaction time and hitting 101 targets. NMR performed for improvements in motor control and coordination, balance, sequencing, judgement, and self confidence/ efficacy in performing all aspects of mobility at highest level of independence.  ? ?Patient seated  in recliner at end of session with brakes locked, belt alarm set, and all needs within reach. Oriented to time and time of next therapy session. ? ? ?Therapy Documentation ?Precautions:  ?Precautions ?Precautions: Fall ?Precaution Comments: L hemipareisis/ataxia, L inattention, poor safety awareness ?Restrictions ?Weight Bearing Restrictions: No ?General: ?  ?Pain: ? No pain complaint this session.  ? ?Therapy/Group: Individual Therapy ? ?Alger Simons PT, DPT ?01/31/2022, 7:29 AM  ?

## 2022-02-01 NOTE — Progress Notes (Signed)
Physical Therapy Session Note ? ?Patient Details  ?Name: Robert Warner ?MRN: [id number] ?Date of Birth: 22-Sep-1941 ? ?Today's Date: 02/01/2022 ?PT Individual Time: 0141-0301 ?PT Individual Time Calculation (min): 38 min  ? ?Short Term Goals: ?Week 1:  PT Short Term Goal 1 (Week 1): Pt will demonstrate bed mobility with overall CGA. ?PT Short Term Goal 2 (Week 1): Pt will demonstrate consistent CGA with standing transfers while safely using LRAD. ?PT Short Term Goal 3 (Week 1): Pt will ambulate 150 feet consistently using LRAD with MinA. ?PT Short Term Goal 4 (Week 1): Pt will complete at least 4 steps using RHR to ascend with consistent MinA. ? ?Skilled Therapeutic Interventions/Progress Updates:  ? ?Pt received supine in bed and agreeable to PT. Supine>sit transfer with supervision assist and cues for management of the LUE.  ? ?Stand pivot transfer to [name] with no AD and CGA with cues for improved step length on the LLE. Pt perform facial and oral hygiene at sink to wash and shave face with electric razor and brush teeh. Supervision assist from PT for occasional cues for imrpvoed awareness of position of the LUE.  ? ?Pt transported to rehab gym in [facility]. Pt performed sit<>stand transfers with CGA-supervision assist x 5 with cues for use of LUE supported on PT intermittently with HHA. Dynamic gait training through rehab gym over level surface x 173f with CGA-min assist through HHA LUE with to one near LOB from excessive adduction of the LLE. Stepping over 1 inch obstacles on the floor 2 x 4 with min assist for HHA on the L. Cues for step width when navigating obstacle with LLE. Side stepping R and L 2 x 162fwith min assist through HHA on the LUE. Min cues for attention to task to visual scanning to the L.  ? ?[facility] and all needs met.   ?  ? ? ? ? ?   ? ?Therapy Documentation ?Precautions:  ?Precautions ?Precautions: Fall ?Precaution Comments: L  hemipareisis/ataxia, L inattention, poor safety awareness ?Restrictions ?Weight Bearing Restrictions: No ?General: ?PT Amount of Missed Time (min): 22 Minutes ? ?  ?Pain: ?Pain Assessment ?Pain Scale: 0-10 ?Pain Score: 0-No pain ? ? ? ? ?Therapy/Group: Individual Therapy ? ?AuLorie Phenix3/22/2023, 8:51 AM  ?

## 2022-02-01 NOTE — Plan of Care (Signed)
?  Problem: Consults ?Goal: RH STROKE PATIENT EDUCATION ?Description: See Patient Education module for education specifics  ?Outcome: Progressing ?  ?Problem: RH BOWEL ELIMINATION ?Goal: RH STG MANAGE BOWEL WITH ASSISTANCE ?Description: STG Manage Bowel with  mod I Assistance. ?Outcome: Progressing ?Goal: RH STG MANAGE BOWEL W/MEDICATION W/ASSISTANCE ?Description: STG Manage Bowel with Medication with mod I  Assistance. ?Outcome: Progressing ?  ?Problem: RH BLADDER ELIMINATION ?Goal: RH STG MANAGE BLADDER WITH ASSISTANCE ?Description: STG Manage Bladder With mod I Assistance ?Outcome: Progressing ?Goal: RH STG MANAGE BLADDER WITH MEDICATION WITH ASSISTANCE ?Description: STG Manage Bladder With Medication With  mod I Assistance. ?Outcome: Progressing ?  ?Problem: RH SAFETY ?Goal: RH STG ADHERE TO SAFETY PRECAUTIONS W/ASSISTANCE/DEVICE ?Description: STG Adhere to Safety Precautions With cues Assistance/Device. ?Outcome: Progressing ?  ?Problem: RH PAIN MANAGEMENT ?Goal: RH STG PAIN MANAGED AT OR BELOW PT'S PAIN GOAL ?Description: At or below level 4 w prns ?Outcome: Progressing ?  ?Problem: RH KNOWLEDGE DEFICIT ?Goal: RH STG INCREASE KNOWLEDGE OF DIABETES ?Description: Patient and wife will be able to manage DM with medications and dietary modifications using handouts and educational resources independently ?Outcome: Progressing ?Goal: RH STG INCREASE KNOWLEGDE OF HYPERLIPIDEMIA ?Description: Patient and wife will be able to manage HLD with medications and dietary modifications using handouts and educational resources independently ?Outcome: Progressing ?Goal: RH STG INCREASE KNOWLEDGE OF STROKE PROPHYLAXIS ?Description: Patient and wife will be able to manage DVT/secondary risk with medications and dietary modifications using handouts and educational resources independently ?Outcome: Progressing ?  ?

## 2022-02-01 NOTE — Patient Care Conference (Signed)
Inpatient RehabilitationTeam Conference and Plan of Care Update ?Date: 02/01/2022   Time: 10:10 AM  ? ? ?Patient Name: Robert Warner      ?Medical Record Number: [id number]  ?Date of Birth: 09/05/1941 ?Sex: Male         ?Room/Bed: 4M11C/4M11C-01 ?Payor Info: Payor: [employer] MEDICARE / Plan: [place] [employer] / Product Type: *No Product type* /   ? ?Admit Date/Time:  01/27/2022  4:38 PM ? ?Primary Diagnosis:  CVA (cerebral vascular accident) ([place]) ? ?Hospital Problems: Principal Problem: ?  CVA (cerebral vascular accident) ([place]) ? ? ? ?Expected Discharge Date: Expected Discharge Date: 02/09/22 ? ?Team Members Present: ?Physician leading conference: Dr. Alysia Penna ?Social Worker Present: Erlene Quan, BSW ?Nurse Present: Dorien Chihuahua, RN ?PT Present: Barrie Folk, PT ?OT Present: Providence Lanius, OT ?SLP Present: Sherren Kerns, SLP ?PPS Coordinator present : Gunnar Fusi, SLP ? ?   Current Status/Progress Goal Weekly Team Focus  ?Bowel/Bladder ? ?   Continent of bladder; using condom at HS. Continent of bowel   Continent of bowel and bladder without use of condom catheter   Toileting  ?Swallow/Nutrition/ Hydration ? ?           ?ADL's ? ? S bathing, UBD, mod LBD, toileting, toileting transfer via stand-pivot with use of grab bars; LUE ataxia and poor safety awareness/insight into deficits  CGA transfers, S ADL  LUE NMR, transfer retraining, pt/family education, self-care/balance retraining, DME/AE educaiton   ?Mobility ? ? impulsive and lack of awareness of impairments to the point of affecting safety, poor memory, Bed mobility CGA, Transfers and Gait at MinA  Overall supervision/ CGA  Work on: L hemi, L inattention, overall balance, transfers, gait, family education   ?Communication ? ?           ?Safety/Cognition/ Behavioral Observations ? mod A  min A  compensatory memory strategies, basic problem solving, awareness of deficits, family education   ?Pain ? ?   N/A        ?Skin ? ?   N/A         ? ? ?Discharge Planning:  ?Patient discharging home with spouse primarily, daughter able to assist some. 24/7 superivison avaliable   ?Team Discussion: ?Patient with hypercoagulation state associated with carcinoma with liver mets. UTI treated per MD. DM controlled per MD with metformin. Patient has weakness in left upper extremity with ataxia and poor motor planning with new onset of hyperverbosity.  ?Patient on target to meet rehab goals: ?yes, currently needs supervision for upper body bathing and dressing and min assist for lower body dressing. Able to complete stand pivot to the toilet but needs mi - mod assist for toileting.  ? ?*See Care Plan and progress notes for long and short-term goals.  ? ?Revisions to Treatment Plan:  ?Patient is close to baseline cognitively per family; patient and family education per SLP. Working on impulsivity, poor insight to deficits and redirection, attention and memory. ?Gait training ?Upgraded OT/PT goals to supervision level ? ?Teaching Needs: ?Safety, transfers, toileting, medication management/lovenox injections, secondary risk management, etc.  ?Current Barriers to Discharge: ?Decreased caregiver support and Home enviroment access/layout ? ?Possible Resolutions to Barriers: ?Family education with wife ?24/7 supervision per wife/daughter ?HH follow up services ?DME: TBD ?  ? ? Medical Summary ?Current Status: DM controlled BP controled, on Lovenox for thromboembolism prophyllaxis full dose recommended by heme/onc and neurology ? Barriers to Discharge: Medical stability ?  ?Possible Resolutions to [employer]: will need to self administer  lovenox at discharge ? ? ?Continued Need for Acute Rehabilitation Level of Care: The patient requires daily medical management by a physician with specialized training in physical medicine and rehabilitation for the following reasons: ?Direction of a multidisciplinary physical rehabilitation program to maximize functional  independence : Yes ?Medical management of patient stability for increased activity during participation in an intensive rehabilitation regime.: Yes ?Analysis of laboratory values and/or radiology reports with any subsequent need for medication adjustment and/or medical intervention. : Yes ? ? ?I attest that I was present, lead the team conference, and concur with the assessment and plan of the team. ? ? ?Dorien Chihuahua B ?02/01/2022, 3:16 PM  ? ? ? ? ? ? ?

## 2022-02-01 NOTE — Progress Notes (Signed)
Physical Therapy Session Note ? ?Patient Details  ?Name: Robert Warner ?MRN: [id number] ?Date of Birth: [birth year] ? ?Today's Date: 02/01/2022 ?PT Individual Time: 1002-1030 ?PT Individual Time Calculation (min): 28 min  ? ?Short Term Goals: ?Week 1:  PT Short Term Goal 1 (Week 1): Pt will demonstrate bed mobility with overall CGA. ?PT Short Term Goal 2 (Week 1): Pt will demonstrate consistent CGA with standing transfers while safely using LRAD. ?PT Short Term Goal 3 (Week 1): Pt will ambulate 150 feet consistently using LRAD with MinA. ?PT Short Term Goal 4 (Week 1): Pt will complete at least 4 steps using RHR to ascend with consistent MinA. ? ?Skilled Therapeutic Interventions/Progress Updates:  ?  Pt seated in w/c on arrival and agreeable to therapy. No complaint of pain. Pt with bright, cheerful affect, joking with therapist throughout session. Pt ambulated to/from therapy gym, ~180 ft with RW. Pt able to recall what visual cues on RW are for, but continues to need intermittent cueing for RW proximity and attention to LUE grip. Pt demoes poor safety awareness and impulsivity, especially when turning to sit, which worsens with fatigue. In quiet ortho gym, pt performed visual scanning and reaching task to locate and collect cones at various height, pt using both L and R hands at times. Small LOB noted when moving around obstacles, as pt tends to pick up RW and move it to the side instead of backing out of tight space. Min-mod A to recover balance at times, but largely CGA. Pt then navigated through cones on floor in both set pattern and randomly scattered. Pt able to problem solve and navigate random pattern with CGA, min at times for balance. Set pattern with CGA and minimal difficulty. Pt also performed car transfer with CGA, backing up to seat without cue. Pt returned to room, demoing increased impulsivity with other people in hallway, before returning to w/c. Pt remained in w/c and was left with all needs in  reach and alarm active.  ? ?Therapy Documentation ?Precautions:  ?Precautions ?Precautions: Fall ?Precaution Comments: L hemipareisis/ataxia, L inattention, poor safety awareness ?Restrictions ?Weight Bearing Restrictions: No ?General: ?PT Amount of Missed Time (min): 22 Minutes ? ? ? ? ?Therapy/Group: Individual Therapy ? ?[place] ?02/01/2022, 11:20 AM  ?

## 2022-02-01 NOTE — Progress Notes (Signed)
Patient ID: Robert Warner, male   DOB: 1941/08/27, 81 y.o.   MRN: [id number] ?Team Conference Report to Patient/Family ? ?Team Conference discussion was reviewed with the patient and caregiver, including goals, any changes in plan of care and target discharge date.  Patient and caregiver express understanding and are in agreement.  The patient has a target discharge date of 02/09/22. ? ?Sw met with patient and called spouse at bedside. Provided team conference updates. Patient happy about d/c date and upgraded goals. Patient and spouse prefer to d/c with HH.  ? ?[name] ?02/01/2022, 1:25 PM  ?

## 2022-02-01 NOTE — Progress Notes (Signed)
Speech Language Pathology Daily Session Note ? ?Patient Details  ?Name: Robert Warner ?MRN: [id number] ?Date of Birth: 1941-06-06 ? ?Today's Date: 02/01/2022 ?SLP Individual Time: 1100-1200 ?SLP Individual Time Calculation (min): 60 min ? ?Short Term Goals: ?Week 1: SLP Short Term Goal 1 (Week 1): Patient will complete mildly complex problem solving tasks with min-to-mod A verbal/visual cues ?SLP Short Term Goal 2 (Week 1): Patient will demonstrate awareness to errors and self correct with min-to-mod A verbal/visual cues ?SLP Short Term Goal 3 (Week 1): Patient will demonstrate use of compensatory memory strategies with min-to-mod A verbal/visual cues ?SLP Short Term Goal 4 (Week 1): Patient will demonstrate selective attention to tasks in a moderately distracting environment for 20 minutes with min A verbal cues for redirection ?SLP Short Term Goal 5 (Week 1): Patient will verbalize at least 2 safety precautions associated with new limitations with min-to-mod A cues. ? ?Skilled Therapeutic Interventions:Skilled ST services focused on cognitive skills. SLP facilitated basic problem solving, recall and error awareness skills in ALFA money management task. Pt initially required max A verbal cues due to memory deficits and poor organization, however when instructed to utilize written aids pt only required min A verbal cues. Pt was able to sequence 4 step ADL picture cards with mod A fade to min A verbal cues. Pt was left in room with call bell within reach and chair alarm set. SLP recommends to continue skilled services. ?   ? ?Pain ?Pain Assessment ?Pain Score: 0-No pain ? ?Therapy/Group: Individual Therapy ? ?[name] ?02/01/2022, 3:41 PM ?

## 2022-02-01 NOTE — Progress Notes (Signed)
? ?                                                       PROGRESS NOTE ? ? ?Subjective/Complaints: ? ?Slept well , no pains , still weak on left  ?ROS: ? ?Pt denies SOB, abd pain, CP, N/V/C/D, and vision changes ? ? ? ?Objective: ?  ?No results found. ?No results for input(s): WBC, HGB, HCT, PLT in the last 72 hours. ? ?No results for input(s): NA, K, CL, CO2, GLUCOSE, BUN, CREATININE, CALCIUM in the last 72 hours. ? ? ?Intake/Output Summary (Last 24 hours) at 02/01/2022 0707 ?Last data filed at 02/01/2022 0423 ?Gross per 24 hour  ?Intake 600 ml  ?Output 1500 ml  ?Net -900 ml  ? ?  ? ?  ? ?Physical Exam: ?Vital Signs ?Blood pressure 119/82, pulse 87, temperature 98.2 ?F (36.8 ?C), resp. rate 18, height _0  (1.778 m), SpO2 98 %. ? ? ?General: No acute distress ?Mood and affect are appropriate ?Heart: Regular rate and rhythm no rubs murmurs or extra sounds ?Lungs: Clear to auscultation, breathing unlabored, no rales or wheezes ?Abdomen: Positive bowel sounds, soft nontender to palpation, nondistended ?Extremities: No clubbing, cyanosis, or edema ? ? ?Musculoskeletal:     ?   General: No swelling.  ?Skin: ?   General: Skin is warm and dry.  ?   Comments: No breakdown  ?Neurological:  ?   Mental Status: He is alert.  ?Motor: LLE: 4/5, LUE 3/5, Right side 5/5. Sensation is mildly reduced left vs right  ?Moderate left neglect  ? ?Assessment/Plan: ?1. Functional deficits which require 3+ hours per day of interdisciplinary therapy in a comprehensive inpatient rehab setting. ?Physiatrist is providing close team supervision and 24 hour management of active medical problems listed below. ?Physiatrist and rehab team continue to assess barriers to discharge/monitor patient progress toward functional and medical goals ? ?Care Tool: ? ?Bathing ?   ?Body parts bathed by patient: Right arm, Left arm, Face, Chest  ?   ?  ?  ?Bathing assist Assist Level: Supervision/Verbal cueing ?  ?  ?Upper Body Dressing/Undressing ?Upper body  dressing   ?What is the patient wearing?: Pull over shirt ?   ?Upper body assist Assist Level: Supervision/Verbal cueing ?   ?Lower Body Dressing/Undressing ?Lower body dressing ? ? ?   ?What is the patient wearing?: Pants ? ?  ? ?Lower body assist Assist for lower body dressing: Moderate Assistance - Patient 50 - 74% ?   ? ?Toileting ?Toileting    ?Toileting assist Assist for toileting: Moderate Assistance - Patient 50 - 74% ?  ?  ?Transfers ?Chair/bed transfer ? ?Transfers assist ?   ? ?Chair/bed transfer assist level: Minimal Assistance - Patient > 75% ?  ?  ?Locomotion ?Ambulation ? ? ?Ambulation assist ? ?   ? ?Assist level: Moderate Assistance - Patient 50 - 74% ?Assistive device: Walker-rolling ?Max distance: 1f  ? ?Walk 10 feet activity ? ? ?Assist ?   ? ?Assist level: Moderate Assistance - Patient - 50 - 74% ?Assistive device: Walker-rolling  ? ?Walk 50 feet activity ? ? ?Assist   ? ?Assist level: Moderate Assistance - Patient - 50 - 74% ?Assistive device: Walker-rolling  ? ? ?Walk 150 feet activity ? ? ?Assist Walk 150 feet activity did not occur: Safety/medical  concerns ? ?  ?  ?  ? ?Walk 10 feet on uneven surface  ?activity ? ? ?Assist Walk 10 feet on uneven surfaces activity did not occur: Safety/medical concerns ? ? ?  ?   ? ?Wheelchair ? ? ? ? ?Assist Is the patient using a wheelchair?: Yes ?Type of Wheelchair: Manual ?  ? ?Wheelchair assist level: Maximal Assistance - Patient 25 - 49%, Total Assistance - Patient < 25% ?Max wheelchair distance: 110 ft  ? ? ?Wheelchair 50 feet with 2 turns activity ? ? ? ?Assist ? ?  ?  ? ? ?Assist Level: Maximal Assistance - Patient 25 - 49%, Total Assistance - Patient < 25%  ? ?Wheelchair 150 feet activity  ? ? ? ?Assist ?   ? ? ?Assist Level: Total Assistance - Patient < 25%  ? ?Blood pressure 119/82, pulse 87, temperature 98.2 ?F (36.8 ?C), resp. rate 18, height _0  (1.778 m), SpO2 98 %. ? ?Medical Problem List and Plan: ?1. Functional deficits secondary to  embolic shower of right MCA territory- embolic , hypercoag state due to  ?            -patient may shower ?            -ELOS/Goals: 10-14 days S ?            Con't CIR- PT and OT- Team conference today please see physician documentation under team conference tab, met with team  to discuss problems,progress, and goals. Formulized individual treatment plan based on medical history, underlying problem and comorbidities. ? ?2.  Antithrombotics: ?-DVT/anticoagulation:  Pharmaceutical: Lovenox 120 mg daily- did clarify with Heme -Onc and Neuro pt will be going home on this med   ?Hx of calf DVT - hypercoag state due to malignancy  ?            -antiplatelet therapy: None ?3. Pain: continue Tylenol, tramadol as needed ? 3/18- pain controlled- con't regimen- took tramadol at home.  ?4. Mood: LCSW to evaluate patient and provide emotional support ?            -antipsychotic agents: N/A ?5. Neuropsych: This patient is capable of making decisions on his own behalf. ?6. Skin/Wound Care: Routine skin care checks ?7. Fluids/Electrolytes/Nutrition: Routine I's and O's and follow-up chemistries ?8. Hyperlipidemia: Continue Lipitor 40 mg daily ?            -- Increasing dose not recommended secondary to liver cancer ?9: DM-2: Continue sliding scale insulin, Semglee 16 units daily ?- Start metformin 532m daily.  ?            -- Carb modified diet ?            --On metformin 750 mg at home ?CBG (last 3)  ?Recent Labs  ?  01/31/22 ?1733 01/31/22 ?2114 02/01/22 ?0631  ?GLUCAP 119* 133* 104*  ? ?Controlled 3/22 ? ?10: BPH: Continue Flomax ?11: UTI: continue cefdinir through weekend ?            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            --follow-up with Dr. SBenay Spice3/24, has CA related hypercoagulable state  ? 89 Age indeterminate left peroneal vein DVT; diagnosed on 12/05/2021  initially on Eliquis>>now on Lovenox ?14: Prior CVA with history of L>R  intra-atrial shunt ?            --  breakthrough CVA in 11/2021 ?            --Now on Lovenox, statin for secondary prevention ?15. Nutrition ? 3/19- will order glucerna for pt- d/c IV per pt request ?  ? ? ?LOS: ?5 days ?A FACE TO FACE EVALUATION WAS PERFORMED ? ?Robert Warner Rondy Krupinski ?02/01/2022, 7:07 AM  ? ? ? ? ?

## 2022-02-02 ENCOUNTER — Telehealth: Payer: Self-pay | Admitting: Oncology

## 2022-02-02 DIAGNOSIS — C787 Secondary malignant neoplasm of liver and intrahepatic bile duct: Secondary | ICD-10-CM

## 2022-02-02 DIAGNOSIS — N390 Urinary tract infection, site not specified: Secondary | ICD-10-CM

## 2022-02-02 DIAGNOSIS — E669 Obesity, unspecified: Secondary | ICD-10-CM

## 2022-02-02 DIAGNOSIS — E1169 Type 2 diabetes mellitus with other specified complication: Secondary | ICD-10-CM

## 2022-02-02 DIAGNOSIS — A499 Bacterial infection, unspecified: Secondary | ICD-10-CM

## 2022-02-02 LAB — GLUCOSE, CAPILLARY
Glucose-Capillary: 75 mg/dL (ref 70–99)
Glucose-Capillary: 173 mg/dL — ABNORMAL HIGH (ref 70–99)
Glucose-Capillary: 113 mg/dL — ABNORMAL HIGH (ref 70–99)

## 2022-02-02 NOTE — Discharge Instructions (Addendum)
Inpatient Rehab Discharge Instructions ? ?Robert Warner ?Discharge date and time: 02/09/2022 ? ?Activities/Precautions/ Functional Status: ?Activity: no lifting, driving, or strenuous exercise for until cleared by MD ?Diet: cardiac diet ?Wound Care: none needed ?Functional status:  ?___ No restrictions     ___ Walk up steps independently ?_x__ 24/7 supervision/assistance   ___ Walk up steps with assistance ?___ Intermittent supervision/assistance  ___ Bathe/dress independently ?___ Walk with walker     ___ Bathe/dress with assistance ?___ Walk Independently    ___ Shower independently ?___ Walk with assistance    _x__ Shower with assistance ?__x_ No alcohol     ___ Return to work/school ________ ? ?Special Instructions: ? ?COMMUNITY REFERRALS UPON DISCHARGE:   ? ?Home Health:   PT     OT     ST        ?               Agency: [facility]  Phone: [phone]  ? ? ? ?No driving, alcohol consumption or tobacco use.  ? ? ?STROKE/TIA DISCHARGE INSTRUCTIONS ?SMOKING Cigarette smoking nearly doubles your risk of having a stroke & is the single most alterable risk factor  ?If you smoke or have smoked in the last 12 months, you are advised to quit smoking for your health. Most of the excess cardiovascular risk related to smoking disappears within a year of stopping. ?Ask you doctor about anti-smoking medications ?[place] Quit Line: 1-800-QUIT NOW ?Free Smoking Cessation Classes [phone]  ?CHOLESTEROL Know your levels; limit fat & cholesterol in your diet  ?Lipid Panel  ?   ?Component Value Date/Time  ? CHOL 158 01/24/2022 0605  ? TRIG 47 01/24/2022 0605  ? TRIG 56 10/02/2006 0939  ? HDL 64 01/24/2022 0605  ? CHOLHDL 2.5 01/24/2022 0605  ? VLDL 9 01/24/2022 0605  ? [place] 85 01/24/2022 0605  ? ? ? Many patients benefit from treatment even if their cholesterol is at goal. ?Goal: Total Cholesterol (CHOL) less than 160 ?Goal:  Triglycerides (TRIG) less than 150 ?Goal:  HDL greater than 40 ?Goal:  LDL (LDLCALC) less than 100 ?   ?BLOOD PRESSURE American Stroke Association blood pressure target is less that 120/80 mm/Hg  ?Your discharge blood pressure is:  BP: 122/84 Monitor your blood pressure ?Limit your salt and alcohol intake ?Many individuals will require more than one medication for high blood pressure  ?DIABETES (A1c is a blood sugar average for last 3 months) Goal HGBA1c is under 7% (HBGA1c is blood sugar average for last 3 months)  ?Diabetes: ?Diagnosis of diabetes:  Your A1c:8.0 %   ? ?Lab Results  ?Component Value Date  ? HGBA1C 8.0 (H) 01/24/2022  ? ? Your HGBA1c can be lowered with medications, healthy diet, and exercise. ?Check your blood sugar as directed by your physician ?Call your physician if you experience unexplained or low blood sugars.  ?PHYSICAL ACTIVITY/REHABILITATION Goal is 30 minutes at least 4 days per week  ?Activity: Increase activity slowly, ?Therapies: Physical Therapy: Outpatient and Occupational Therapy: Outpatient ?Return to work: n/a Activity decreases your risk of heart attack and stroke and makes your heart stronger.  It helps control your weight and blood pressure; helps you relax and can improve your mood. ?Participate in a regular exercise program. ?Talk with your doctor about the best form of exercise for you (dancing, walking, swimming, cycling).  ?DIET/WEIGHT Goal is to maintain a healthy weight  ?Your discharge diet is:  ?Diet Order   ? ?       ?  Diet Heart Room service appropriate? Yes; Fluid consistency: Thin  Diet effective now       ?  ? ?  ?  ? ?  ? thin liquids ?Your height is:  Height: '5\' 10"'$  (177.8 cm) ?Your current weight is: Weight: 83.1 kg ?Your Body Mass Index (BMI) is:  BMI (Calculated): 26.29 Following the type of diet specifically designed for you will help prevent another stroke. ?Your goal weight range is:   ?Your goal Body Mass Index (BMI) is 19-24. ?Healthy food habits can help reduce 3 risk factors for stroke:  High cholesterol, hypertension, and excess weight.  ?RESOURCES  Stroke/Support Group:  Call [phone] ?  ?STROKE EDUCATION PROVIDED/REVIEWED AND GIVEN TO PATIENT Stroke warning signs and symptoms ?How to activate emergency medical system (call 911). ?Medications prescribed at discharge. ?Need for follow-up after discharge. ?Personal risk factors for stroke. ?Pneumonia vaccine given: No ?Flu vaccine given: No ?My questions have been answered, the writing is legible, and I understand these instructions.  I will adhere to these goals & educational materials that have been provided to me after my discharge from the hospital.  ? ?  ? ?My questions have been answered and I understand these instructions. I will adhere to these goals and the provided educational materials after my discharge from the hospital. ? ?Patient/Caregiver Signature _______________________________ Date __________ ? ?Clinician Signature _______________________________________ Date __________ ? ?Please bring this form and your medication list with you to all your follow-up doctor's appointments.   ?

## 2022-02-02 NOTE — Telephone Encounter (Signed)
Attempted to contact patient wife to schedule lab and office visit per in basket message. No answer so voicemail was left for patient wife to call back. ?

## 2022-02-02 NOTE — Progress Notes (Signed)
Speech Language Pathology Daily Session Note ? ?Patient Details  ?Name: Robert Warner ?MRN: [id number] ?Date of Birth: 10-May-1941 ? ?Today's Date: 02/02/2022 ?SLP Individual Time: 6144-3154 ?SLP Individual Time Calculation (min): 56 min ? ?Short Term Goals: ?Week 1: SLP Short Term Goal 1 (Week 1): Patient will complete mildly complex problem solving tasks with min-to-mod A verbal/visual cues ?SLP Short Term Goal 2 (Week 1): Patient will demonstrate awareness to errors and self correct with min-to-mod A verbal/visual cues ?SLP Short Term Goal 3 (Week 1): Patient will demonstrate use of compensatory memory strategies with min-to-mod A verbal/visual cues ?SLP Short Term Goal 4 (Week 1): Patient will demonstrate selective attention to tasks in a moderately distracting environment for 20 minutes with min A verbal cues for redirection ?SLP Short Term Goal 5 (Week 1): Patient will verbalize at least 2 safety precautions associated with new limitations with min-to-mod A cues. ? ?Skilled Therapeutic Interventions: Skilled ST treatment focused on cognitive goals. Patient completed chair to wheelchair transfer with sup A cues for safety. SLP facilitated session in a mildly distracting environment with min A verbal redirection for attention to task. SLP utilized the BITS to recall up to 5 pictures in sequential order with mod-to-max A cues to achieve 60% accuracy, and 50% accuracy to recall up to 4 words with mod-to-max A cues and consistent repetition of task instructions. Pt often forgot that he needed to select words in sequential order vs. Randomly with poor carry over of this concept. He also exhibited decreased insight into deficits as he would often comment "the computer is broken" rather than acknowledging errors. SLP then facilitated mildly complex problem solving and required mod A for sustained attention due to suspected cognitive fatigue. Patient transferred from wheelchair to recliner with sup A cues for ensuring  breaks were locked prior to standing. Patient was left in recliner with alarm activated and immediate needs within reach at end of session. Continue per current plan of care.   ?   ?Pain ?Pain Assessment ?Pain Scale: 0-10 ?Pain Score: 0-No pain ? ?Therapy/Group: Individual Therapy ? ?Edrik Rundle T Baileigh Modisette ?02/02/2022, 12:19 PM ?

## 2022-02-02 NOTE — Progress Notes (Signed)
Physical Therapy Session Note ? ?Patient Details  ?Name: Robert Warner ?MRN: [id number] ?Date of Birth: 20-Jan-1941 ? ?Today's Date: 02/02/2022 ?PT Individual Time: 1300-1400 ?PT Individual Time Calculation (min): 60 min  ? ?Short Term Goals: ?Week 1:  PT Short Term Goal 1 (Week 1): Pt will demonstrate bed mobility with overall CGA. ?PT Short Term Goal 2 (Week 1): Pt will demonstrate consistent CGA with standing transfers while safely using LRAD. ?PT Short Term Goal 3 (Week 1): Pt will ambulate 150 feet consistently using LRAD with MinA. ?PT Short Term Goal 4 (Week 1): Pt will complete at least 4 steps using RHR to ascend with consistent MinA. ? ? ?Skilled Therapeutic Interventions/Progress Updates:  ? ?Pt received sitting in recliner and agreeable to PT. Ambulatory transfer to [facility] with min assist with HHA on the LUE. Pt transported to day room in [facility]. Gait training over levl ground x 1107f with min assist through HHA on the LUE. Mild lateral sway R and L with intermittent adduction with the LLE.  ? ?BWSTT. Forward gait training x 3 bouts,  ?1) 2:339m, 218 ft, 1.0 mph.  ?2) 26243f3:00 min, 1.0 mph ?3) 311f24f:30mi23m0 mph.  ?Intermittent tactile and verbal cues for improved step length and height on the LLE to prevent foot drag and verbal cues to prevent adduction on the LLE.  ? ?Side stepping in BWSTT  ?2 min Bil for 4 min total 0.3-0.4 mph for ~60 ft R and L for 120ft 30fl.  ?Min assist to prevent compensation through rotation while side stepping and cues for sequencing of movements when stepping to the L.  ? ?Patient returned to room and performed stand pivot to recliner with min assist and HHA on the LUE. Pt left sitting in recliner with call bell in reach and all needs met.   ? ? ?   ? ?Therapy Documentation ?Precautions:  ?Precautions ?Precautions: Fall ?Precaution Comments: L hemipareisis/ataxia, L inattention, poor safety awareness ?Restrictions ?Weight Bearing Restrictions: No ? ? ?Pain: ?Pain  Assessment ?Pain Scale: 0-10 ?Pain Score: 7  ?Pain Type: Acute pain ?Pain Location: Hip ?Pain Orientation: Right ?Pain Descriptors / Indicators: Aching ? ? ? ?Therapy/Group: Individual Therapy ? ?AustinLorie Phenix/2023, 2:01 PM  ?

## 2022-02-02 NOTE — Discharge Planning (Signed)
Oncology Discharge Planning Note ? ?[facility] [place] ?Address: [address] [place], [place], [place] [address] ?Hours of Operation:  8am - 5pm, Monday - Friday  ?Clinic Contact Information:  [phone]) [phone] ? ?Oncology Care Team: ?Medical Oncologist:  [name] ? ?Patient Details: ?Name:  Robert Warner, Robert Warner ?MRN:   [id number] ?DOB:   June 02, 1941 ?Reason for Current Admission: CVA (cerebral vascular accident) ([place]) ? ?Discharge Planning Narrative: ?Notification of admission received by MD for [name].  Discharge follow-up appointments for oncology will be available on the AVS and MyChart.   ?Upon discharge from the hospital, hematology/oncology's post discharge plan of care for the outpatient setting is: Lab/MD visit week of 02/13/22  ? ?[name] will be called within two business days after discharge to review hematology/oncology's plan of care for full understanding.   ? ?Outpatient Oncology Specific Care Only: ?Oncology appointment transportation needs addressed?:  Mrs. Dolberry will transport ?Oncology medication management for symptom management addressed?:  no ?Chemo Alert Card reviewed?:  no ?Immunotherapy Alert Card reviewed?:  not applicable ?

## 2022-02-02 NOTE — Progress Notes (Signed)
Occupational Therapy Session Note ? ?Patient Details  ?Name: Robert Warner ?MRN: [id number] ?Date of Birth: July 01, 1941 ? ?Today's Date: 02/02/2022 ?OT Individual Time: 4888-9169 ?OT Individual Time Calculation (min): 83 min  ? ? ?Short Term Goals: ?Week 1:  OT Short Term Goal 1 (Week 1): Pt will don pants min A ?OT Short Term Goal 2 (Week 1): Pt will complete >2 grooming tasks standing at sink with CGA. ?OT Short Term Goal 3 (Week 1): Pt will require no more than min VCs to safely position LUE while seated in chair. ? ?Skilled Therapeutic Interventions/Progress Updates:  ?  Pt received seated EOB finishing breakfast, denies pain, agreeable to therapy. Session focus on self-care retraining, activity tolerance, transfer retraining in prep for improved ADL/IADL/func mobility performance + decreased caregiver burden. Required set-up A for breakfast items, noted to have knocked off tray several items. Ambulatory toilet then shower transfer with light min A for RW management/steadying assist. Total A to remove brief due to poor initiation and internal distraction. Continent void of bowel, completed seated pericare with close S. In shower, bathed full-body with close S due to L lean and perseveration on task, cues to bathe L side. Shower not working so sponge bathed, will update maintenance. ? ?Donned shirt seated EOB with light min A to pull down in back. Donned pants min A to thread LLE and orientation, initially threading into wrong leg hole with poor awareness. Stood at sink to shave with Copy with min to light mod A due to no UE support and L lean, poor awareness of L hand slipping off RW. Noted to perseverate on shaving task requiring multimodaling cuing to cease activity.  ? ?Pt left seated in recliner with safety belt alarm engaged, call bell in reach, and all immediate needs met.  ? ? ?Therapy Documentation ?Precautions:  ?Precautions ?Precautions: Fall ?Precaution Comments: L hemipareisis/ataxia, L  inattention, poor safety awareness ?Restrictions ?Weight Bearing Restrictions: No ? ?Pain: ? denies ?ADL: See Care Tool for more details. ? ? ?Therapy/Group: Individual Therapy ? ?Volanda Napoleon MS, OTR/L ? ?02/02/2022, 6:52 AM ?

## 2022-02-02 NOTE — Progress Notes (Signed)
? ?                                                       PROGRESS NOTE ? ? ?Subjective/Complaints: ? ?No new issues. In chair working on some word puzzles. No complaints ? ?ROS: Patient denies fever, rash, sore throat, blurred vision, dizziness, nausea, vomiting, diarrhea, cough, shortness of breath or chest pain, joint or back/neck pain, headache, or mood change.  ? ? ?Objective: ?  ?No results found. ?No results for input(s): WBC, HGB, HCT, PLT in the last 72 hours. ? ?No results for input(s): NA, K, CL, CO2, GLUCOSE, BUN, CREATININE, CALCIUM in the last 72 hours. ? ? ?Intake/Output Summary (Last 24 hours) at 02/02/2022 1037 ?Last data filed at 02/02/2022 3536 ?Gross per 24 hour  ?Intake 1125 ml  ?Output 700 ml  ?Net 425 ml  ?  ? ?  ? ?Physical Exam: ?Vital Signs ?Blood pressure (!) 132/96, pulse (!) 101, temperature 98.1 ?F (36.7 ?C), resp. rate 19, height '5\' 10"'$  (1.778 m), weight 83.1 kg, SpO2 100 %. ? ? ?Constitutional: No distress . Vital signs reviewed. ?HEENT: NCAT, EOMI, oral membranes moist ?Neck: supple ?Cardiovascular: RRR without murmur. No JVD    ?Respiratory/Chest: CTA Bilaterally without wheezes or rales. Normal effort    ?GI/Abdomen: BS +, non-tender, non-distended ?Ext: no clubbing, cyanosis, or edema ?Psych: pleasant and cooperative  ?Musculoskeletal:     ?   General: No swelling.  ?Skin: ?   General: Skin is warm and dry.  ?   Comments: No breakdown  ?Neurological:  ?   Mental Status: He is alert.  ?Motor: LLE: 4/5, LUE 3 to 3+/5, Right side 5/5. Sensation is mildly reduced left vs right  ?Moderate left neglect  but seemed to be attending well on left for me today ? ?Assessment/Plan: ?1. Functional deficits which require 3+ hours per day of interdisciplinary therapy in a comprehensive inpatient rehab setting. ?Physiatrist is providing close team supervision and 24 hour management of active medical problems listed below. ?Physiatrist and rehab team continue to assess barriers to discharge/monitor  patient progress toward functional and medical goals ? ?Care Tool: ? ?Bathing ?   ?Body parts bathed by patient: Right arm, Left arm, Face, Chest  ?   ?  ?  ?Bathing assist Assist Level: Supervision/Verbal cueing ?  ?  ?Upper Body Dressing/Undressing ?Upper body dressing   ?What is the patient wearing?: Pull over shirt ?   ?Upper body assist Assist Level: Supervision/Verbal cueing ?   ?Lower Body Dressing/Undressing ?Lower body dressing ? ? ?   ?What is the patient wearing?: Pants ? ?  ? ?Lower body assist Assist for lower body dressing: Moderate Assistance - Patient 50 - 74% ?   ? ?Toileting ?Toileting    ?Toileting assist Assist for toileting: Moderate Assistance - Patient 50 - 74% ?  ?  ?Transfers ?Chair/bed transfer ? ?Transfers assist ?   ? ?Chair/bed transfer assist level: Minimal Assistance - Patient > 75% ?  ?  ?Locomotion ?Ambulation ? ? ?Ambulation assist ? ?   ? ?Assist level: Moderate Assistance - Patient 50 - 74% ?Assistive device: Walker-rolling ?Max distance: 25f  ? ?Walk 10 feet activity ? ? ?Assist ?   ? ?Assist level: Moderate Assistance - Patient - 50 - 74% ?Assistive device: Walker-rolling  ? ?Walk  50 feet activity ? ? ?Assist   ? ?Assist level: Moderate Assistance - Patient - 50 - 74% ?Assistive device: Walker-rolling  ? ? ?Walk 150 feet activity ? ? ?Assist Walk 150 feet activity did not occur: Safety/medical concerns ? ?  ?  ?  ? ?Walk 10 feet on uneven surface  ?activity ? ? ?Assist Walk 10 feet on uneven surfaces activity did not occur: Safety/medical concerns ? ? ?  ?   ? ?Wheelchair ? ? ? ? ?Assist Is the patient using a wheelchair?: Yes ?Type of Wheelchair: Manual ?  ? ?Wheelchair assist level: Maximal Assistance - Patient 25 - 49%, Total Assistance - Patient < 25% ?Max wheelchair distance: 110 ft  ? ? ?Wheelchair 50 feet with 2 turns activity ? ? ? ?Assist ? ?  ?  ? ? ?Assist Level: Maximal Assistance - Patient 25 - 49%, Total Assistance - Patient < 25%  ? ?Wheelchair 150 feet activity   ? ? ? ?Assist ?   ? ? ?Assist Level: Total Assistance - Patient < 25%  ? ?Blood pressure (!) 132/96, pulse (!) 101, temperature 98.1 ?F (36.7 ?C), resp. rate 19, height '5\' 10"'$  (1.778 m), weight 83.1 kg, SpO2 100 %. ? ?Medical Problem List and Plan: ?1. Functional deficits secondary to embolic shower of right MCA territory- embolic , hypercoag state due to  ?            -patient may shower ?            -ELOS/Goals: 10-14 days S ?            -Continue CIR therapies including PT, OT  ? ?2.  Antithrombotics: ?-DVT/anticoagulation:  Pharmaceutical: Lovenox 120 mg daily- did clarify with Heme -Onc and Neuro pt will be going home on this med   ?Hx of calf DVT - hypercoag state due to malignancy  ?            -antiplatelet therapy: None ?3. Pain: continue Tylenol, tramadol as needed ? 3/23- pain controlled- con't regimen- took tramadol at home.  ?4. Mood: LCSW to evaluate patient and provide emotional support ?            -antipsychotic agents: N/A ?5. Neuropsych: This patient is capable of making decisions on his own behalf. ?6. Skin/Wound Care: Routine skin care checks ?7. Fluids/Electrolytes/Nutrition: Routine I's and O's and follow-up chemistries ?8. Hyperlipidemia: Continue Lipitor 40 mg daily ?            -- Increasing dose not recommended secondary to liver cancer ?9: DM-2: Continue sliding scale insulin, Semglee 16 units daily ?- Start metformin '500mg'$  daily.  ?            -- Carb modified diet ?            --On metformin 750 mg at home ?CBG (last 3)  ?Recent Labs  ?  02/01/22 ?1646 02/01/22 ?2121 02/02/22 ?0604  ?GLUCAP 140* 153* 75  ?Controlled 3/23 ? ?10: BPH: Continue Flomax ?11: UTI: continue cefdinir through weekend ?            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            --follow-up with [name] 3/24, has CA related hypercoagulable state  ? 102: Age indeterminate left peroneal vein DVT; diagnosed on 12/05/2021  initially  on Eliquis>>now on Lovenox ?14: Prior CVA with history of  L>R intra-atrial shunt ?            --breakthrough CVA in 11/2021 ?            --continueLovenox, statin for secondary prevention ?15. Nutrition ? 3/23 intake is good, eating 100% ?  ? ? ?LOS: ?6 days ?A FACE TO FACE EVALUATION WAS PERFORMED ? ?Meredith Staggers ?02/02/2022, 10:37 AM  ? ? ? ? ?

## 2022-02-03 ENCOUNTER — Inpatient Hospital Stay: Payer: Medicare HMO | Admitting: Oncology

## 2022-02-03 LAB — CBC
HCT: 33.3 % — ABNORMAL LOW (ref 39.0–52.0)
Hemoglobin: 11.2 g/dL — ABNORMAL LOW (ref 13.0–17.0)
MCH: 29.9 pg (ref 26.0–34.0)
MCHC: 33.6 g/dL (ref 30.0–36.0)
MCV: 88.8 fL (ref 80.0–100.0)
Platelets: 202 10*3/uL (ref 150–400)
RBC: 3.75 MIL/uL — ABNORMAL LOW (ref 4.22–5.81)
RDW: 14.1 % (ref 11.5–15.5)
WBC: 4.4 10*3/uL (ref 4.0–10.5)
nRBC: 0 % (ref 0.0–0.2)

## 2022-02-03 LAB — BASIC METABOLIC PANEL
Anion gap: 7 (ref 5–15)
BUN: 20 mg/dL (ref 8–23)
CO2: 25 mmol/L (ref 22–32)
Calcium: 9.1 mg/dL (ref 8.9–10.3)
Chloride: 104 mmol/L (ref 98–111)
Creatinine, Ser: 1.23 mg/dL (ref 0.61–1.24)
GFR, Estimated: 59 mL/min — ABNORMAL LOW (ref >60)
Glucose, Bld: 108 mg/dL — ABNORMAL HIGH (ref 70–99)
Potassium: 4.5 mmol/L (ref 3.5–5.1)
Sodium: 136 mmol/L (ref 135–145)

## 2022-02-03 LAB — GLUCOSE, CAPILLARY
Glucose-Capillary: 230 mg/dL — ABNORMAL HIGH (ref 70–99)
Glucose-Capillary: 115 mg/dL — ABNORMAL HIGH (ref 70–99)
Glucose-Capillary: 115 mg/dL — ABNORMAL HIGH (ref 70–99)
Glucose-Capillary: 129 mg/dL — ABNORMAL HIGH (ref 70–99)
Glucose-Capillary: 122 mg/dL — ABNORMAL HIGH (ref 70–99)

## 2022-02-03 NOTE — Progress Notes (Signed)
Speech Language Pathology Daily Session Note ? ?Patient Details  ?Name: Robert Warner ?MRN: [id number] ?Date of Birth: [birth year] ? ?Today's Date: 02/03/2022 ?SLP Individual Time: 6226-3335 ?SLP Individual Time Calculation (min): 42 min ? ?Short Term Goals: ?Week 1: SLP Short Term Goal 1 (Week 1): Patient will complete mildly complex problem solving tasks with min-to-mod A verbal/visual cues ?SLP Short Term Goal 2 (Week 1): Patient will demonstrate awareness to errors and self correct with min-to-mod A verbal/visual cues ?SLP Short Term Goal 3 (Week 1): Patient will demonstrate use of compensatory memory strategies with min-to-mod A verbal/visual cues ?SLP Short Term Goal 4 (Week 1): Patient will demonstrate selective attention to tasks in a moderately distracting environment for 20 minutes with min A verbal cues for redirection ?SLP Short Term Goal 5 (Week 1): Patient will verbalize at least 2 safety precautions associated with new limitations with min-to-mod A cues. ? ?Skilled Therapeutic Interventions: Skilled ST treatment focused on cognitive goals. Patient completed "solving daily math problems" subtest in the Assessment of Language-Related Functional Activities (ALFA). Patient achieved 30% (3/10) accuracy without clinician support. Patient achieved 70% accuracy with max A verbal and written cues. Patient required consistent verbal reminders to identify which problems had already been completed, as he exhibited reduced self monitoring skills and recall of what had been previously addressed. Pt required min-to-mod A verbal redirection for completion of tasks within a mild-to-moderately distracting environment. At end of session patient reflected on performance and acknowledged that the task was more difficult than it would have been prior to his strokes. Patient was left in recliner with alarm activated and immediate needs within reach at end of session. Continue per current plan of care.   ?   ?Pain ?Pain  Assessment ?Pain Scale: 0-10 ?Pain Score: 0-No pain ? ?Therapy/Group: Individual Therapy ? ?Robert Warner ?02/03/2022, 1:46 PM ?

## 2022-02-03 NOTE — Progress Notes (Signed)
? ?                                                       PROGRESS NOTE ? ? ?Subjective/Complaints: ? ?Up in his recliner. No new complaints. Sleeping well. Denies pain ? ?ROS: Patient denies fever, rash, sore throat, blurred vision, dizziness, nausea, vomiting, diarrhea, cough, shortness of breath or chest pain, joint or back/neck pain, headache, or mood change.  ? ? ?Objective: ?  ?No results found. ?Recent Labs  ?  02/03/22 ?5366  ?WBC 4.4  ?HGB 11.2*  ?HCT 33.3*  ?PLT 202  ? ? ?Recent Labs  ?  02/03/22 ?4403  ?NA 136  ?K 4.5  ?CL 104  ?CO2 25  ?GLUCOSE 108*  ?BUN 20  ?CREATININE 1.23  ?CALCIUM 9.1  ? ? ? ?Intake/Output Summary (Last 24 hours) at 02/03/2022 1157 ?Last data filed at 02/03/2022 0800 ?Gross per 24 hour  ?Intake 340 ml  ?Output --  ?Net 340 ml  ?  ? ?  ? ?Physical Exam: ?Vital Signs ?Blood pressure 114/76, pulse 87, temperature 98.1 ?F (36.7 ?C), resp. rate 16, height '5\' 10"'$  (1.778 m), weight 83.1 kg, SpO2 100 %. ? ?Constitutional: No distress . Vital signs reviewed. ?HEENT: NCAT, EOMI, oral membranes moist ?Neck: supple ?Cardiovascular: RRR without murmur. No JVD    ?Respiratory/Chest: CTA Bilaterally without wheezes or rales. Normal effort    ?GI/Abdomen: BS +, non-tender, non-distended ?Ext: no clubbing, cyanosis, or edema ?Psych: pleasant and cooperative  ?Musculoskeletal:     ?   General: No swelling.  ?Skin: ?   General: Skin is warm and dry.  ?   Comments: No breakdown  ?Neurological:  ?   Mental Status: He is alert.  ?Motor: LLE: 4/5, LUE 3 to 3+/5, Right side 5/5. Sensation is mildly reduced on left.  ?Minimal left neglect ? ?Assessment/Plan: ?1. Functional deficits which require 3+ hours per day of interdisciplinary therapy in a comprehensive inpatient rehab setting. ?Physiatrist is providing close team supervision and 24 hour management of active medical problems listed below. ?Physiatrist and rehab team continue to assess barriers to discharge/monitor patient progress toward functional and  medical goals ? ?Care Tool: ? ?Bathing ?   ?Body parts bathed by patient: Right arm, Left arm, Face, Chest, Abdomen, Front perineal area, Right upper leg, Buttocks, Left upper leg, Right lower leg, Left lower leg  ?   ?  ?  ?Bathing assist Assist Level: Contact Guard/Touching assist ?  ?  ?Upper Body Dressing/Undressing ?Upper body dressing   ?What is the patient wearing?: Pull over shirt ?   ?Upper body assist Assist Level: Supervision/Verbal cueing ?   ?Lower Body Dressing/Undressing ?Lower body dressing ? ? ?   ?What is the patient wearing?: Pants ? ?  ? ?Lower body assist Assist for lower body dressing: Minimal Assistance - Patient > 75% ?   ? ?Toileting ?Toileting    ?Toileting assist Assist for toileting: Moderate Assistance - Patient 50 - 74% ?  ?  ?Transfers ?Chair/bed transfer ? ?Transfers assist ?   ? ?Chair/bed transfer assist level: Minimal Assistance - Patient > 75% ?  ?  ?Locomotion ?Ambulation ? ? ?Ambulation assist ? ?   ? ?Assist level: Moderate Assistance - Patient 50 - 74% ?Assistive device: Walker-rolling ?Max distance: 30f  ? ?Walk 10 feet activity ? ? ?  Assist ?   ? ?Assist level: Moderate Assistance - Patient - 50 - 74% ?Assistive device: Walker-rolling  ? ?Walk 50 feet activity ? ? ?Assist   ? ?Assist level: Moderate Assistance - Patient - 50 - 74% ?Assistive device: Walker-rolling  ? ? ?Walk 150 feet activity ? ? ?Assist Walk 150 feet activity did not occur: Safety/medical concerns ? ?  ?  ?  ? ?Walk 10 feet on uneven surface  ?activity ? ? ?Assist Walk 10 feet on uneven surfaces activity did not occur: Safety/medical concerns ? ? ?  ?   ? ?Wheelchair ? ? ? ? ?Assist Is the patient using a wheelchair?: Yes ?Type of Wheelchair: Manual ?  ? ?Wheelchair assist level: Maximal Assistance - Patient 25 - 49%, Total Assistance - Patient < 25% ?Max wheelchair distance: 110 ft  ? ? ?Wheelchair 50 feet with 2 turns activity ? ? ? ?Assist ? ?  ?  ? ? ?Assist Level: Maximal Assistance - Patient 25 - 49%,  Total Assistance - Patient < 25%  ? ?Wheelchair 150 feet activity  ? ? ? ?Assist ?   ? ? ?Assist Level: Total Assistance - Patient < 25%  ? ?Blood pressure 114/76, pulse 87, temperature 98.1 ?F (36.7 ?C), resp. rate 16, height '5\' 10"'$  (1.778 m), weight 83.1 kg, SpO2 100 %. ? ?Medical Problem List and Plan: ?1. Functional deficits secondary to embolic shower of right MCA territory- embolic , hypercoag state due to  ?            -patient may shower ?            -ELOS/Goals: 10-14 days S ?           -Continue CIR therapies including PT, OT  ? ?2.  Antithrombotics: ?-DVT/anticoagulation:  Pharmaceutical: Lovenox 120 mg daily- did clarify with Heme -Onc and Neuro pt will be going home on this med   ?Hx of calf DVT - hypercoag state due to malignancy  ?            -antiplatelet therapy: None ?3. Pain: continue Tylenol, tramadol as needed ? 3/23- pain controlled- con't regimen- took tramadol at home.  ?4. Mood: LCSW to evaluate patient and provide emotional support ?            -antipsychotic agents: N/A ?5. Neuropsych: This patient is capable of making decisions on his own behalf. ?6. Skin/Wound Care: Routine skin care checks ?7. Fluids/Electrolytes/Nutrition: Routine I's and O's and follow-up chemistries ?8. Hyperlipidemia: Continue Lipitor 40 mg daily ?            -- Increasing dose not recommended secondary to liver cancer ?9: DM-2: Continue sliding scale insulin, Semglee 16 units daily ?- Start metformin '500mg'$  daily.  ?            -- Carb modified diet ?            --On metformin 750 mg at home ?CBG (last 3)  ?Recent Labs  ?  02/02/22 ?1655 02/02/22 ?2108 02/03/22 ?0552  ?GLUCAP 113* 230* 115*  ?3/24 had been controlled previously---observe for pattern today ? ?10: BPH: Continue Flomax ?11: UTI: continue cefdinir through weekend ?            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            --follow-up with [name] 3/24, has CA  related hypercoagulable state  ?  25: Age indeterminate left peroneal vein DVT; diagnosed on 12/05/2021  initially on Eliquis>>now on Lovenox ?14: Prior CVA with history of L>R intra-atrial shunt ?            --breakthrough CVA in 11/2021 ?            --continueLovenox, statin for secondary prevention ?15. Nutrition ? 3/23 intake is good, eating 100% ?  ? ? ?LOS: ?7 days ?A FACE TO FACE EVALUATION WAS PERFORMED ? ?Meredith Staggers ?02/03/2022, 11:57 AM  ? ? ? ? ?

## 2022-02-03 NOTE — Progress Notes (Addendum)
Physical Therapy Session Note ? ?Patient Details  ?Name: Robert Warner ?MRN: [id number] ?Date of Birth: Jan 10, 1941 ? ?Today's Date: 02/03/2022 ?PT Individual Time: 0902-1000 ?PT Individual Time Calculation (min): 58 min  ? ?Short Term Goals: ?Week 1:  PT Short Term Goal 1 (Week 1): Pt will demonstrate bed mobility with overall CGA. ?PT Short Term Goal 2 (Week 1): Pt will demonstrate consistent CGA with standing transfers while safely using LRAD. ?PT Short Term Goal 3 (Week 1): Pt will ambulate 150 feet consistently using LRAD with MinA. ?PT Short Term Goal 4 (Week 1): Pt will complete at least 4 steps using RHR to ascend with consistent MinA. ? ? ?Skilled Therapeutic Interventions/Progress Updates:  ?Patient seated in recliner with belt alarm donned on entrance to room. Patient alert and agreeable to PT session. Pt has slid down in recliner and is unable to scoot up into improved seated posture d/t tightness of belt. Will require smaller recliner than current bari-sized recliner for improved fit of posey belt.  ? ?Patient with no pain complaint throughout session. Continues to demonstrate decreased insight into impairments.  ? ?Therapeutic Activity: ?Transfers: Patient performed sit<>stand and stand pivot transfers throughout session with overall close supervision and intermittent need for MinA to maintain upright balance. Provided verbal cues for technique intermittently. ? ?Gait Training:  ?Patient ambulated 100' x1/ 170' x1/ 225' x1 over uneven concrete surfaces outside using RW with CGA/ intermittent MinA for balance. Demonstrated intermittent low step height with LLE. Improved ability to maintain proximity to walker. Pt also guided in ambulating 100' with no AD. Requires vc for slow, conscious advancement of LLE in order to perform adequate foot clearance. Provided vc/ tc throughout for upright posture, balance, L foot clearance. ? ?Pt able to ambulate from outdoor over threshold and across indoor surface to  elevator door. Min vc for clearance of uneven floor surface. Is able to follow instructions for directionality roughly 75% of amb bout. Requiring consistent correction.  ? ?Neuromuscular Re-ed: ?NMR facilitated during session with focus on standing balance. Pt guided in lateral stepping with BUE support on therapist's arms. Performed for increased strengthening of G. Med to correct mild trendelenberg as well as to promote improved balance. NMR performed for improvements in motor control and coordination, balance, sequencing, judgement, and self confidence/ efficacy in performing all aspects of mobility at highest level of independence.  ? ?Patient seated upright  in recliner at end of session with brakes locked, belt alarm set, and all needs within reach. ? ? ?Therapy Documentation ?Precautions:  ?Precautions ?Precautions: Fall ?Precaution Comments: L hemipareisis/ataxia, L inattention, poor safety awareness ?Restrictions ?Weight Bearing Restrictions: No ?General: ?  ?Vital Signs: ?Therapy Vitals ?Temp: 98.1 ?F (36.7 ?C) ?Pulse Rate: 87 ?Resp: 16 ?BP: 114/76 ?Patient Position (if appropriate): Lying ?Oxygen Therapy ?SpO2: 100 % ?O2 Device: Room Air ?Pain: ? No pain complaint this session.  ? ?Therapy/Group: Individual Therapy ? ?Alger Simons PT, DPT ?02/03/2022, 7:55 AM  ?

## 2022-02-03 NOTE — Progress Notes (Signed)
Occupational Therapy Weekly Progress Note ? ?Patient Details  ?Name: Robert Warner ?MRN: [id number] ?Date of Birth: 1941-03-02 ? ?Beginning of progress report period: January 28, 2022 ?End of progress report period: February 03, 2022 ? ?Today's Date: 02/03/2022 ?OT Individual Time: 0702-0759 ?OT Individual Time Calculation (min): 57 min  ? ? ?Patient has met 1 of 3 short term goals.  Pt has made slow but steady progress this week towards LTG. Pt has demonstrated improved dynamic standing balance, activity tolerance, functional use of LUE to presently complete UBD at S, LBD at min A, bathing while seated in shower with CGA, and ambulatory bathroom transfers with min A and RW. Pt cont to be primarily limited by L inattention, poor insight into deficits/safety awareness, LUE ataxia/hemiparesis. Anticipate 24/7 S and CGAA physical assist required upon DC. ? ? ?Patient continues to demonstrate the following deficits: muscle weakness, decreased cardiorespiratoy endurance, impaired timing and sequencing, unbalanced muscle activation, ataxia, decreased coordination, and decreased motor planning, decreased attention to left and decreased motor planning, decreased attention, decreased awareness, decreased problem solving, decreased safety awareness, and decreased memory, and decreased standing balance, decreased postural control, decreased balance strategies, and hemiparesis  and therefore will continue to benefit from skilled OT intervention to enhance overall performance with BADL, iADL, and Reduce care partner burden. ? ?Patient progressing toward long term goals..  Continue plan of care. ? ?OT Short Term Goals ?Week 1:  OT Short Term Goal 1 (Week 1): Pt will don pants min A ?OT Short Term Goal 1 - Progress (Week 1): Met ?OT Short Term Goal 2 (Week 1): Pt will complete >2 grooming tasks standing at sink with CGA. ?OT Short Term Goal 2 - Progress (Week 1): Not met ?OT Short Term Goal 3 (Week 1): Pt will require no more than min  VCs to safely position LUE while seated in chair. ?OT Short Term Goal 3 - Progress (Week 1): Not met ?Week 2:  OT Short Term Goal 1 (Week 2): STG = LTG 2/2 ELOS ? ?Skilled Therapeutic Interventions/Progress Updates:  ?   ?Pt received supine in bed, denies pain, agreeable to therapy. Session focus on self-care retraining, activity tolerance, transfer retraining, LUE NMR in prep for improved ADL/IADL/func mobility performance + decreased caregiver burden. Came to sitting EOB on his R with close S. Ambulatory transfer > shower seat with min A due to mild impulsivity and RW management. Bathed full body with CGA due to difficulty managing shower materials and L /anterior lean. Required min A to scoot back on shower stool as pt was slipping with poor awareness. Donned shirt EOB with S. Multiple attempt to don pants himself, continues to put his legs into the wrong hole with poor awareness. Min A overall to thread BLE , pt able to pull up past hips. Required cues throughout to recall next task for morning routine.  ? ?Completed massed practiced of putty squeezes and rolling putty into log shape with L hand to target LUE NMR, dropped putty into lap several times. ? ?Set-up A for breakfast. Pt noted to require min cues throughout session to locate items on his L side, as well. ? ?Pt left seated in recliner with safety belt alarm engaged, call bell in reach, and all immediate needs met.  ? ?Therapy Documentation ?Precautions:  ?Precautions ?Precautions: Fall ?Precaution Comments: L hemipareisis/ataxia, L inattention, poor safety awareness ?Restrictions ?Weight Bearing Restrictions: No ? ?Pain: ?  denies ?ADL: See Care Tool for more details. ? ?Therapy/Group: Individual Therapy ? ?Apolonio Schneiders  A Jamaine Quintin MS, OTR/L ? ?02/03/2022, 6:54 AM  ?

## 2022-02-04 LAB — GLUCOSE, CAPILLARY
Glucose-Capillary: 101 mg/dL — ABNORMAL HIGH (ref 70–99)
Glucose-Capillary: 172 mg/dL — ABNORMAL HIGH (ref 70–99)
Glucose-Capillary: 102 mg/dL — ABNORMAL HIGH (ref 70–99)
Glucose-Capillary: 153 mg/dL — ABNORMAL HIGH (ref 70–99)

## 2022-02-04 NOTE — Progress Notes (Signed)
Speech Language Pathology Daily Session Note ? ?Patient Details  ?Name: Robert Warner ?MRN: [id number] ?Date of Birth: 08/01/1941 ? ?Today's Date: 02/04/2022 ?SLP Individual Time: 1016-1100 ?SLP Individual Time Calculation (min): 44 min ? ?Short Term Goals: ?Week 1: SLP Short Term Goal 1 (Week 1): Patient will complete mildly complex problem solving tasks with min-to-mod A verbal/visual cues ?SLP Short Term Goal 2 (Week 1): Patient will demonstrate awareness to errors and self correct with min-to-mod A verbal/visual cues ?SLP Short Term Goal 3 (Week 1): Patient will demonstrate use of compensatory memory strategies with min-to-mod A verbal/visual cues ?SLP Short Term Goal 4 (Week 1): Patient will demonstrate selective attention to tasks in a moderately distracting environment for 20 minutes with min A verbal cues for redirection ?SLP Short Term Goal 5 (Week 1): Patient will verbalize at least 2 safety precautions associated with new limitations with min-to-mod A cues. ? ?Skilled Therapeutic Interventions: ?Pt seen for skilled ST with focus on cognitive goals, daughter present throughout. Pt demonstrates some awareness into changes s/p CVA, states he mostly notices a change in his "reasoning skills". SLP facilitating mildly complex problem solving for home environment by providing overall min A cues. Pt reports his wife is responsible for most of his care, house management and high level cognitive tasks (med management, money management, etc). SLP discussing patient's need for supervision with cognitive tasks at home but he states "my wife does enough already". SLP and pt discussing possible alternate solutions to increase pt safety and reduce caregiver burden for his wife but pt demonstrating difficulty planning/organizing thoughts and solutions. Pt was able to ID 2 safety precautions for mobility issues once discharged home with min A cues. Pt left in bed with daughter present for needs, cont ST POC. ? ?Pain ?Pain  Assessment ?Pain Scale: 0-10 ?Pain Score: 0-No pain ? ?Therapy/Group: Individual Therapy ? ?Dewaine Conger ?02/04/2022, 12:16 PM ?

## 2022-02-04 NOTE — Progress Notes (Signed)
? ?                                                       PROGRESS NOTE ? ? ?Subjective/Complaints: ? ?No new issues this morning. Feels that he's not far along as he thinks he should be ? ?ROS: Patient denies fever, rash, sore throat, blurred vision, dizziness, nausea, vomiting, diarrhea, cough, shortness of breath or chest pain, joint or back/neck pain, headache, or mood change.  ? ? ?Objective: ?  ?No results found. ?Recent Labs  ?  02/03/22 ?8811  ?WBC 4.4  ?HGB 11.2*  ?HCT 33.3*  ?PLT 202  ? ? ?Recent Labs  ?  02/03/22 ?0315  ?NA 136  ?K 4.5  ?CL 104  ?CO2 25  ?GLUCOSE 108*  ?BUN 20  ?CREATININE 1.23  ?CALCIUM 9.1  ? ? ? ?Intake/Output Summary (Last 24 hours) at 02/04/2022 1013 ?Last data filed at 02/04/2022 0059 ?Gross per 24 hour  ?Intake 350 ml  ?Output 1175 ml  ?Net -825 ml  ?  ? ?  ? ?Physical Exam: ?Vital Signs ?Blood pressure 117/78, pulse 75, temperature 98.5 ?F (36.9 ?C), temperature source Oral, resp. rate 18, height '5\' 10"'$  (1.778 m), weight 83.1 kg, SpO2 98 %. ? ?Constitutional: No distress . Vital signs reviewed. ?HEENT: NCAT, EOMI, oral membranes moist ?Neck: supple ?Cardiovascular: RRR without murmur. No JVD    ?Respiratory/Chest: CTA Bilaterally without wheezes or rales. Normal effort    ?GI/Abdomen: BS +, non-tender, non-distended ?Ext: no clubbing, cyanosis, or edema ?Psych: pleasant and cooperative  ?Musculoskeletal:     ?   General: No swelling.  ?Skin: ?   General: Skin is warm and dry.  ?   Comments: No breakdown  ?Neurological:  ?   Mental Status: He is alert. Fair insight and awareness ?Motor: LLE: 4/5, LUE 3+/5, Right side 5/5. Sensation is mildly reduced on left.  ?Minimal left neglect present ? ?Assessment/Plan: ?1. Functional deficits which require 3+ hours per day of interdisciplinary therapy in a comprehensive inpatient rehab setting. ?Physiatrist is providing close team supervision and 24 hour management of active medical problems listed below. ?Physiatrist and rehab team continue  to assess barriers to discharge/monitor patient progress toward functional and medical goals ? ?Care Tool: ? ?Bathing ?   ?Body parts bathed by patient: Right arm, Left arm, Face, Chest, Abdomen, Front perineal area, Right upper leg, Buttocks, Left upper leg, Right lower leg, Left lower leg  ?   ?  ?  ?Bathing assist Assist Level: Contact Guard/Touching assist ?  ?  ?Upper Body Dressing/Undressing ?Upper body dressing   ?What is the patient wearing?: Pull over shirt ?   ?Upper body assist Assist Level: Supervision/Verbal cueing ?   ?Lower Body Dressing/Undressing ?Lower body dressing ? ? ?   ?What is the patient wearing?: Pants ? ?  ? ?Lower body assist Assist for lower body dressing: Minimal Assistance - Patient > 75% ?   ? ?Toileting ?Toileting    ?Toileting assist Assist for toileting: Moderate Assistance - Patient 50 - 74% ?  ?  ?Transfers ?Chair/bed transfer ? ?Transfers assist ?   ? ?Chair/bed transfer assist level: Minimal Assistance - Patient > 75% ?  ?  ?Locomotion ?Ambulation ? ? ?Ambulation assist ? ?   ? ?Assist level: Moderate Assistance - Patient 50 - 74% ?  Assistive device: Walker-rolling ?Max distance: 15f  ? ?Walk 10 feet activity ? ? ?Assist ?   ? ?Assist level: Moderate Assistance - Patient - 50 - 74% ?Assistive device: Walker-rolling  ? ?Walk 50 feet activity ? ? ?Assist   ? ?Assist level: Moderate Assistance - Patient - 50 - 74% ?Assistive device: Walker-rolling  ? ? ?Walk 150 feet activity ? ? ?Assist Walk 150 feet activity did not occur: Safety/medical concerns ? ?  ?  ?  ? ?Walk 10 feet on uneven surface  ?activity ? ? ?Assist Walk 10 feet on uneven surfaces activity did not occur: Safety/medical concerns ? ? ?  ?   ? ?Wheelchair ? ? ? ? ?Assist Is the patient using a wheelchair?: Yes ?Type of Wheelchair: Manual ?  ? ?Wheelchair assist level: Maximal Assistance - Patient 25 - 49%, Total Assistance - Patient < 25% ?Max wheelchair distance: 110 ft  ? ? ?Wheelchair 50 feet with 2 turns  activity ? ? ? ?Assist ? ?  ?  ? ? ?Assist Level: Maximal Assistance - Patient 25 - 49%, Total Assistance - Patient < 25%  ? ?Wheelchair 150 feet activity  ? ? ? ?Assist ?   ? ? ?Assist Level: Total Assistance - Patient < 25%  ? ?Blood pressure 117/78, pulse 75, temperature 98.5 ?F (36.9 ?C), temperature source Oral, resp. rate 18, height '5\' 10"'$  (1.778 m), weight 83.1 kg, SpO2 98 %. ? ?Medical Problem List and Plan: ?1. Functional deficits secondary to embolic shower of right MCA territory- embolic , hypercoag state due to  ?            -patient may shower ?            -ELOS/Goals: 10-14 days S ?          -Continue CIR therapies including PT, OT, SLP ? ?2.  Antithrombotics: ?-DVT/anticoagulation:  Pharmaceutical: Lovenox 120 mg daily- did clarify with Heme -Onc and Neuro pt will be going home on this med   ?Hx of calf DVT - hypercoag state due to malignancy  ?            -antiplatelet therapy: None ?3. Pain: continue Tylenol, tramadol as needed ? 3/25- pain controlled- took tramadol at home.  ?4. Mood: LCSW to evaluate patient and provide emotional support ?            -antipsychotic agents: N/A ?5. Neuropsych: This patient is capable of making decisions on his own behalf. ?6. Skin/Wound Care: Routine skin care checks ?7. Fluids/Electrolytes/Nutrition: Routine I's and O's and follow-up chemistries ?8. Hyperlipidemia: Continue Lipitor 40 mg daily ?            -- Increasing dose not recommended secondary to liver cancer ?9: DM-2: Continue sliding scale insulin, Semglee 16 units daily ?- Start metformin '500mg'$  daily.  ?            -- Carb modified diet ?            --On metformin 750 mg at home ?CBG (last 3)  ?Recent Labs  ?  02/03/22 ?1652 02/03/22 ?2102 02/04/22 ?0602  ?GLUCAP 129* 122* 101*  ?3/25 good control ? ?10: BPH: Continue Flomax ?11: UTI: continue cefdinir through weekend ?            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             --follow-up  with [name] 3/24, has CA related hypercoagulable state  ? 29: Age indeterminate left peroneal vein DVT; diagnosed on 12/05/2021  initially on Eliquis>>now on Lovenox ?14: Prior CVA with history of L>R intra-atrial shunt ?            --breakthrough CVA in 11/2021 ?            --continueLovenox, statin for secondary prevention ?15. Nutrition ? 3/23 intake is good, eating 100% ?  ? ? ?LOS: ?8 days ?A FACE TO FACE EVALUATION WAS PERFORMED ? ?Meredith Staggers ?02/04/2022, 10:13 AM  ? ? ? ? ?

## 2022-02-05 LAB — GLUCOSE, CAPILLARY
Glucose-Capillary: 80 mg/dL (ref 70–99)
Glucose-Capillary: 118 mg/dL — ABNORMAL HIGH (ref 70–99)
Glucose-Capillary: 107 mg/dL — ABNORMAL HIGH (ref 70–99)
Glucose-Capillary: 179 mg/dL — ABNORMAL HIGH (ref 70–99)

## 2022-02-05 NOTE — Progress Notes (Signed)
Physical Therapy Session Note ? ?Patient Details  ?Name: Robert Warner ?MRN: [id number] ?Date of Birth: [birth year] ? ?Today's Date: 02/05/2022 ?PT Individual Time: 0800-0900 ?PT Individual Time Calculation (min): 60 min  ? ?Short Term Goals: ?Week 1:  PT Short Term Goal 1 (Week 1): Pt will demonstrate bed mobility with overall CGA. ?PT Short Term Goal 2 (Week 1): Pt will demonstrate consistent CGA with standing transfers while safely using LRAD. ?PT Short Term Goal 3 (Week 1): Pt will ambulate 150 feet consistently using LRAD with MinA. ?PT Short Term Goal 4 (Week 1): Pt will complete at least 4 steps using RHR to ascend with consistent MinA. ? ?Skilled Therapeutic Interventions/Progress Updates:  ?  Pt received seated in bed, agreeable to PT session. No complaints of pain but pt does report not feeling well overall, unable to describe symptoms and agreeable to participate in therapy session as able. Bed mobility Supervision with use of bedrail and HOB maximally elevated. Pt is min A to don pants, min A to doff shirt and don new shirt, max A to don shoes while seated EOB. Pt requires ongoing cueing for safety as he impulsively stands while dressing to pull up his pants without use of RW or making sure therapist is ready. Pt performs standing to RW with Supervision throughout session with cueing for safety and UE placement. Stand pivot transfer to w/c with RW and CGA for balance, cues for safe RW management. Pt is setup A for oral hygiene and washing his face while seated in w/c at sink. Ambulation x 180 ft, x 100 ft with RW and CGA for balance, cues for safe RW management and to recall directions of where he is navigating to. Pt struggles with recall of directions (ex: told to turn left into next doorway, pt passes doorway then is reminded he was told to turn left,  pt becomes frustrated and states therapist did not give him directions). Pt becomes unsafe when he misses turns and is reminded of where he was supposed  to be heading, turns quickly with RW to correct his direction. When given cues for safe RW management and turning pt becomes frustrated with therapist. Ascend/descend 4 x 6" stairs with R handrail per home setup with min A, cues for safety and only use of R handrail to simulate home setup. Pt becomes unsafe with stair navigation due to L knee buckling, directed to perform stairs laterally with BUE support on R handrail. Pt exhibits improved safety with lateral stair navigation. Standing alt L/R 4" step-taps with 3# ankle weight on LLE for increased proprioceptive input, 2 x 10 reps initially with RW and CGA, progressing to no AD and min A for balance. Pt tends to lose balance posteriorly and exhibits decreased control of BLE in standing without use of AD. Pt left seated in w/c in room with needs in reach, quick release belt and chair alarm in place at end of session. ? ?Therapy Documentation ?Precautions:  ?Precautions ?Precautions: Fall ?Precaution Comments: L hemipareisis/ataxia, L inattention, poor safety awareness ?Restrictions ?Weight Bearing Restrictions: No ? ? ? ? ? ?Therapy/Group: Individual Therapy ? ? ?Excell Seltzer, PT, DPT, CSRS ?02/05/2022, 10:26 AM  ?

## 2022-02-05 NOTE — Progress Notes (Signed)
Physical Therapy Weekly Progress Note ? ?Patient Details  ?Name: Robert Warner ?MRN: [id number] ?Date of Birth: 11-Oct-1941 ? ?Beginning of progress report period: January 28, 2022 ?End of progress report period: February 05, 2022 ? ?Today's Date: 02/05/2022 ? ?Patient has met 4 of 4 short term goals.  Pt is making good progress towards therapy goals. He is currently Supervision for bed mobility, Supervision for sit to stand and CGA for transfers with RW, CGA for gait up to 180 ft with RW, and min A for stair navigation. He exhibits ongoing impaired safety awareness and decreased awareness of deficits, requires cues for safety during all mobility. ? ?Patient continues to demonstrate the following deficits muscle weakness, decreased cardiorespiratoy endurance, impaired timing and sequencing and decreased coordination, decreased attention to left, decreased attention, decreased awareness, decreased problem solving, and decreased safety awareness, and decreased standing balance, decreased postural control, hemiplegia, and decreased balance strategies and therefore will continue to benefit from skilled PT intervention to increase functional independence with mobility. ? ?Patient progressing toward long term goals..  Continue plan of care. ? ?PT Short Term Goals ?Week 1:  PT Short Term Goal 1 (Week 1): Pt will demonstrate bed mobility with overall CGA. ?PT Short Term Goal 1 - Progress (Week 1): Met ?PT Short Term Goal 2 (Week 1): Pt will demonstrate consistent CGA with standing transfers while safely using LRAD. ?PT Short Term Goal 2 - Progress (Week 1): Met ?PT Short Term Goal 3 (Week 1): Pt will ambulate 150 feet consistently using LRAD with MinA. ?PT Short Term Goal 3 - Progress (Week 1): Met ?PT Short Term Goal 4 (Week 1): Pt will complete at least 4 steps using RHR to ascend with consistent MinA. ?PT Short Term Goal 4 - Progress (Week 1): Met ?Week 2:  PT Short Term Goal 1 (Week 2): =LTG due to ELOS ? ?Skilled Therapeutic  Interventions/Progress Updates:  ?Ambulation/gait training;Balance/vestibular training;Cognitive remediation/compensation;Disease management/prevention;Discharge planning;Community reintegration;DME/adaptive equipment instruction;Functional electrical stimulation;Functional mobility training;Patient/family education;Neuromuscular re-education;Pain management;Psychosocial support;Skin care/wound management;Splinting/orthotics;Therapeutic Exercise;Therapeutic Activities;Stair training;UE/LE Strength taining/ROM;UE/LE Coordination activities;Visual/perceptual remediation/compensation;Wheelchair propulsion/positioning  ? ?Therapy Documentation ?Precautions:  ?Precautions ?Precautions: Fall ?Precaution Comments: L hemipareisis/ataxia, L inattention, poor safety awareness ?Restrictions ?Weight Bearing Restrictions: No ? ? ? ?Therapy/Group: Individual Therapy ? ? ?Excell Seltzer, PT, DPT, CSRS ?02/05/2022, 10:36 AM  ?

## 2022-02-06 DIAGNOSIS — E1165 Type 2 diabetes mellitus with hyperglycemia: Secondary | ICD-10-CM | POA: Diagnosis not present

## 2022-02-06 DIAGNOSIS — I63411 Cerebral infarction due to embolism of right middle cerebral artery: Secondary | ICD-10-CM

## 2022-02-06 LAB — GLUCOSE, CAPILLARY
Glucose-Capillary: 101 mg/dL — ABNORMAL HIGH (ref 70–99)
Glucose-Capillary: 176 mg/dL — ABNORMAL HIGH (ref 70–99)
Glucose-Capillary: 109 mg/dL — ABNORMAL HIGH (ref 70–99)
Glucose-Capillary: 147 mg/dL — ABNORMAL HIGH (ref 70–99)

## 2022-02-06 MED ORDER — METFORMIN HCL 500 MG PO TABS
500.0000 mg | ORAL_TABLET | Freq: Two times a day (BID) | ORAL | Status: DC
Start: 2022-02-06 — End: 2022-02-09
  Administered 2022-02-06 – 2022-02-09 (×6): 500 mg via ORAL
  Filled 2022-02-06 (×6): qty 1

## 2022-02-06 NOTE — Progress Notes (Signed)
Speech Language Pathology Daily Session Note ? ?Patient Details  ?Name: Robert Warner ?MRN: [id number] ?Date of Birth: 02-03-1941 ? ?Today's Date: 02/06/2022 ?SLP Individual Time: 6378-5885 ?SLP Individual Time Calculation (min): 50 min ? ?Short Term Goals: ?Week 1: SLP Short Term Goal 1 (Week 1): Patient will complete mildly complex problem solving tasks with min-to-mod A verbal/visual cues ?SLP Short Term Goal 2 (Week 1): Patient will demonstrate awareness to errors and self correct with min-to-mod A verbal/visual cues ?SLP Short Term Goal 3 (Week 1): Patient will demonstrate use of compensatory memory strategies with min-to-mod A verbal/visual cues ?SLP Short Term Goal 4 (Week 1): Patient will demonstrate selective attention to tasks in a moderately distracting environment for 20 minutes with min A verbal cues for redirection ?SLP Short Term Goal 5 (Week 1): Patient will verbalize at least 2 safety precautions associated with new limitations with min-to-mod A cues. ? ?Skilled Therapeutic Interventions: Skilled ST treatment focused on cognitive goals. SLP facilitated session by providing min A verbal cues for verbal reasoning, attention, and working memory task to recall words based on attribute. SLP educated on repetition and association strategy to promote short-term recall. Pt utilized with sup-to-min A cues during cognitive tasks. SLP educated on uses for a memory notebook as well as demonstration on organization techniques. Patient and spouse verbalized understanding and thought it would be a beneficial tool to support independence. Pt requested to use bathroom at end of session where he ambulated using RW and sup A for safety. Pt voided bladder and required max A for clean up due to spillage of urine on floor, as well as sup A verbal cues to pull up brief and pants, as pt attempted to walk without RW and with pants still down around ankles. Pt returned to bed with alarm activated and immediate needs within  reach at end of session. Continue per current plan of care.   ?   ? ?Pain ?Pain Assessment ?Pain Scale: 0-10 ?Pain Score: 0-No pain ? ?Therapy/Group: Individual Therapy ? ?Evonna Stoltz T Horris Speros ?02/06/2022, 4:09 PM ?

## 2022-02-06 NOTE — Progress Notes (Signed)
Physical Therapy Session Note ? ?Patient Details  ?Name: Robert Warner ?MRN: [id number] ?Date of Birth: 01/08/1941 ? ?Today's Date: 02/06/2022 ?PT Individual Time: 3254-9826 ?PT Individual Time Calculation (min): 40 min  ? ?Short Term Goals: ?Week 2:  PT Short Term Goal 1 (Week 2): =LTG due to ELOS ? ?Skilled Therapeutic Interventions/Progress Updates:  ? Session focused on functional gait on unit with RW, basic transfers, safety with mobility and L attention during mobility, and balance assessments to indicate fall risk (see below). Reviewed results with patient who verbalized understanding but also verbalizes no concerns in regards to mobility and balance in general. Continue to recommend AD for safety and supervision due to impulsivity and decreased L attention. ? ?Pt performs basic transfers with overall CGA due to decreased attention, impulsivity and decreased postural control to correct for LOB. Gait on unit x 90' with CGA and decreased L foot clearance, jerky movements with LLE to overcompensate when noticed decreased L foot clearance, and cues for upright posture and safe positioning of RW especially during turns. Pt does demonstrate decreased ability to follow multistep commands and appears slighty irritated with therapist when unable to recall directions (example: instructions presented during the TUG or directions down the hallway).  ? ?Gait over ramp with RW and over mulched surface with RW to simulate community mobility and access with overall CGA to min assist for balance. Cues for safety and attention to obstacle and safe placement of RW to decrease fall risk. Pt with tendency to move quicker than body ready for.  ? ?End of session set up in w/c with call bell in reach and NT in the room.  ? ? ? ?Therapy Documentation ?Precautions:  ?Precautions ?Precautions: Fall ?Precaution Comments: L hemipareisis/ataxia, L inattention, poor safety awareness ?Restrictions ?Weight Bearing Restrictions: No ? ?Pain: ?  Denies pain. ?   ?Balance: ?Standardized Balance Assessment ?Standardized Balance Assessment: Timed Up and Go Test ?Berg Balance Test ?Sit to Stand: Needs minimal aid to stand or to stabilize ?Standing Unsupported: Able to stand 30 seconds unsupported ?Sitting with Back Unsupported but Feet Supported on Floor or Stool: Able to sit safely and securely 2 minutes ?Stand to Sit: Controls descent by using hands ?Transfers: Able to transfer with verbal cueing and /or supervision ?Standing Unsupported with Eyes Closed: Able to stand 10 seconds with supervision ?Standing Ubsupported with Feet Together: Able to place feet together independently but unable to hold for 30 seconds ?From Standing, Reach Forward with Outstretched Arm: Can reach forward >5 cm safely (2") ?From Standing Position, Pick up Object from Floor: Able to pick up shoe, needs supervision ?From Standing Position, Turn to Look Behind Over each Shoulder: Turn sideways only but maintains balance ?Turn 360 Degrees: Needs assistance while turning (CGA) ?Standing Unsupported, Alternately Place Feet on Step/Stool: Able to complete >2 steps/needs minimal assist ?Standing Unsupported, One Foot in Front: Loses balance while stepping or standing ?Standing on One Leg: Unable to try or needs assist to prevent fall ?Total Score: 25 ?Timed Up and Go Test ?TUG: Normal TUG ?Normal TUG (seconds): 28.66 (avg 3 trials with RW; 28 sec, 34 sec, 24 sec) ? ? ? ?Therapy/Group: Individual Therapy ? ?Allayne Gitelman ?Lars Masson, PT, DPT, CBIS ? ?02/06/2022, 12:03 PM  ?

## 2022-02-06 NOTE — Progress Notes (Signed)
Physical Therapy Session Note ? ?Patient Details  ?Name: Robert Warner ?MRN: [id number] ?Date of Birth: Jul 27, 1941 ? ?Today's Date: 02/06/2022 ?PT Individual Time: 2841-3244 ?PT Individual Time Calculation (min): 43 min  ? ?Short Term Goals: ?Week 1:  PT Short Term Goal 1 (Week 1): Pt will demonstrate bed mobility with overall CGA. ?PT Short Term Goal 1 - Progress (Week 1): Met ?PT Short Term Goal 2 (Week 1): Pt will demonstrate consistent CGA with standing transfers while safely using LRAD. ?PT Short Term Goal 2 - Progress (Week 1): Met ?PT Short Term Goal 3 (Week 1): Pt will ambulate 150 feet consistently using LRAD with MinA. ?PT Short Term Goal 3 - Progress (Week 1): Met ?PT Short Term Goal 4 (Week 1): Pt will complete at least 4 steps using RHR to ascend with consistent MinA. ?PT Short Term Goal 4 - Progress (Week 1): Met ?Week 2:  PT Short Term Goal 1 (Week 2): =LTG due to ELOS ? ?Skilled Therapeutic Interventions/Progress Updates:  ?Patient seated upright and slightly askew in w/c  on entrance to room. Pt not comfortable d/t posey belt and inability to properly adjust position in w/c. Patient alert and agreeable to PT session.  ? ?Patient with no pain complaint throughout session. ? ?Therapeutic Activity: ?Transfers: Patient performed sit<>stand and stand pivot transfers throughout session with CGA and improving to supervision. Provided verbal cues forhand placement to seat and not to walker for improved technique and ease of rising to stand.  ? ?Gait Training:  ?Patient ambulated >220 ft using RW with CGA. Demonstrated impulsive and quick 90 degree lift of RW when provided with instructions to turn to R. Provided vc/ tc for slower and safer turning with RW on floor, increasing LLE step height/ length intermittently, level gaze, upright posture. ? ?Pt also ambulated 63' x1/ 220' x1 without AD. Improved posture noted. Some intermittent crossover stepping for balance adjustment noted. Education to pt for attempt  to maintain wider BOS throughout. Pt has to stop throughout amb bouts when turning head to look around or when talking to someone.  ? ?Neuromuscular Re-ed: ?NMR facilitated during session with focus on standing balance and coordination. Pt guided in forward ambulation with no AD and instructed to stay to right of cones on amb out and to L of cones on way in/ back to mat table.Instructed and provided with visual demonstration of desired toe taps with L toe on the way out and R toe on the way in..Pt requires consistent vc for taking time to motor plan single leg stance balance then coordinating toe touches with L toe. Able to remain to R of cones.  On return trip, pt walks to straddle cones and touch with R toe. Requires consistent cues to remain to L side with pt unable to consistently perform as instructed.  Same for all three bouts requiring CGA. NMR performed for improvements in motor control and coordination, balance, sequencing, judgement, and self confidence/ efficacy in performing all aspects of mobility at highest level of independence.  ? ?Patient seated upright  in w/c at end of session with brakes locked, belt alarm loosened slightly and set, and all needs within reach. Oriented to time and time of next therapy session.  ? ? ?Therapy Documentation ?Precautions:  ?Precautions ?Precautions: Fall ?Precaution Comments: L hemipareisis/ataxia, L inattention, poor safety awareness ?Restrictions ?Weight Bearing Restrictions: No ?General: ?  ?Vital Signs: ? ?Pain: ? No pain complaint this session ? ?Therapy/Group: Individual Therapy ? ?Alger Simons PT, DPT ?  02/06/2022, 12:50 PM  ?

## 2022-02-06 NOTE — Progress Notes (Signed)
Occupational Therapy Session Note ? ?Patient Details  ?Name: TAMMY ERICSSON ?MRN: [id number] ?Date of Birth: [birth year] ? ?Today's Date: 02/06/2022 ?OT Individual Time: 0505-6788 ?OT Individual Time Calculation (min): 40 min  ? ? ?Short Term Goals: ?Week 2:  OT Short Term Goal 1 (Week 2): STG = LTG 2/2 ELOS ? ? ?Skilled Therapeutic Interventions/Progress Updates:  ?  Pt supine with no c/o pain, reporting feeling slightly unwell but unable to describe further. He was very motivated to participate in OT. He completed bed mobility with (S), leaving LUE behind at times but able to correct without cueing. Sit > stand impulsively with CGA. CGA transfer into the bathroom with the RW. Pt voided urine. He required CGA to transfer off the toilet into standing at the sink. Min A for standing balance support d/t inattention to the L and unawareness of postural sway as he fatigued standing. He washing LB in standing with balance support only. Seated UB bathing with (S), min cueing for thoroughness. UB dressing with (S), min A to thread LE. Pt required min cueing throughout session for attention to the L side, the hand often holding onto items with little awareness by pt. Pt was left sitting up with all needs met, chair alarm set.  ? ?Therapy Documentation ?Precautions:  ?Precautions ?Precautions: Fall ?Precaution Comments: L hemipareisis/ataxia, L inattention, poor safety awareness ?Restrictions ?Weight Bearing Restrictions: No ? ?Therapy/Group: Individual Therapy ? ?Curtis Sites ?02/06/2022, 6:32 AM ?

## 2022-02-06 NOTE — Progress Notes (Signed)
? ?                                                       PROGRESS NOTE ? ? ?Subjective/Complaints: ?Sitting up in chair, waiting for PT. Discussed PT time with him and he is disappointed it is not sooner ?Seen later in day ambulating well with PT ? ?ROS: Patient denies fever, rash, sore throat, blurred vision, dizziness, nausea, vomiting, diarrhea, cough, shortness of breath or chest pain, joint or back/neck pain, headache, or mood change.  ? ? ?Objective: ?  ?No results found. ?No results for input(s): WBC, HGB, HCT, PLT in the last 72 hours. ? ? ?No results for input(s): NA, K, CL, CO2, GLUCOSE, BUN, CREATININE, CALCIUM in the last 72 hours. ? ? ? ?Intake/Output Summary (Last 24 hours) at 02/06/2022 1040 ?Last data filed at 02/06/2022 0815 ?Gross per 24 hour  ?Intake 1360 ml  ?Output 600 ml  ?Net 760 ml  ?  ? ?  ? ?Physical Exam: ?Vital Signs ?Blood pressure (!) 152/91, pulse 73, temperature 98 ?F (36.7 ?C), temperature source Oral, resp. rate 18, height '5\' 10"'$  (1.778 m), weight 83.1 kg, SpO2 100 %. ?Gen: no distress, normal appearing ?HEENT: oral mucosa pink and moist, NCAT ?Cardio: Reg rate ?Chest: normal effort, normal rate of breathing ?Abd: soft, non-distended ?Ext: no edema ?Psych: pleasant, normal affect ?Musculoskeletal:     ?   General: No swelling.  ?Skin: ?   General: Skin is warm and dry.  ?   Comments: No breakdown  ?Neurological:  ?   Mental Status: He is alert. Fair insight and awareness ?Motor: LLE: 4/5, LUE 3+/5, Right side 5/5. Sensation is mildly reduced on left.  ?Minimal left neglect present ? ?Assessment/Plan: ?1. Functional deficits which require 3+ hours per day of interdisciplinary therapy in a comprehensive inpatient rehab setting. ?Physiatrist is providing close team supervision and 24 hour management of active medical problems listed below. ?Physiatrist and rehab team continue to assess barriers to discharge/monitor patient progress toward functional and medical goals ? ?Care  Tool: ? ?Bathing ?   ?Body parts bathed by patient: Right arm, Left arm, Face, Chest, Abdomen, Front perineal area, Right upper leg, Buttocks, Left upper leg, Right lower leg, Left lower leg  ?   ?  ?  ?Bathing assist Assist Level: Contact Guard/Touching assist ?  ?  ?Upper Body Dressing/Undressing ?Upper body dressing   ?What is the patient wearing?: Pull over shirt ?   ?Upper body assist Assist Level: Supervision/Verbal cueing ?   ?Lower Body Dressing/Undressing ?Lower body dressing ? ? ?   ?What is the patient wearing?: Pants ? ?  ? ?Lower body assist Assist for lower body dressing: Minimal Assistance - Patient > 75% ?   ? ?Toileting ?Toileting    ?Toileting assist Assist for toileting: Moderate Assistance - Patient 50 - 74% ?  ?  ?Transfers ?Chair/bed transfer ? ?Transfers assist ?   ? ?Chair/bed transfer assist level: Contact Guard/Touching assist ?  ?  ?Locomotion ?Ambulation ? ? ?Ambulation assist ? ?   ? ?Assist level: Contact Guard/Touching assist ?Assistive device: Walker-rolling ?Max distance: 180'  ? ?Walk 10 feet activity ? ? ?Assist ?   ? ?Assist level: Contact Guard/Touching assist ?Assistive device: Walker-rolling  ? ?Walk 50 feet activity ? ? ?Assist   ? ?  Assist level: Contact Guard/Touching assist ?Assistive device: Walker-rolling  ? ? ?Walk 150 feet activity ? ? ?Assist Walk 150 feet activity did not occur: Safety/medical concerns ? ?Assist level: Contact Guard/Touching assist ?Assistive device: Walker-rolling ?  ? ?Walk 10 feet on uneven surface  ?activity ? ? ?Assist Walk 10 feet on uneven surfaces activity did not occur: Safety/medical concerns ? ? ?  ?   ? ?Wheelchair ? ? ? ? ?Assist Is the patient using a wheelchair?: Yes ?Type of Wheelchair: Manual ?  ? ?Wheelchair assist level: Maximal Assistance - Patient 25 - 49%, Total Assistance - Patient < 25% ?Max wheelchair distance: 110 ft  ? ? ?Wheelchair 50 feet with 2 turns activity ? ? ? ?Assist ? ?  ?  ? ? ?Assist Level: Maximal Assistance -  Patient 25 - 49%, Total Assistance - Patient < 25%  ? ?Wheelchair 150 feet activity  ? ? ? ?Assist ?   ? ? ?Assist Level: Total Assistance - Patient < 25%  ? ?Blood pressure (!) 152/91, pulse 73, temperature 98 ?F (36.7 ?C), temperature source Oral, resp. rate 18, height '5\' 10"'$  (1.778 m), weight 83.1 kg, SpO2 100 %. ? ?Medical Problem List and Plan: ?1. Functional deficits secondary to embolic shower of right MCA territory- embolic , hypercoag state due to  ?            -patient may shower ?            -ELOS/Goals: 10-14 days S ?          -Continue CIR therapies including PT, OT, SLP ? ?2.  Antithrombotics: ?-DVT/anticoagulation:  Pharmaceutical: Lovenox 120 mg daily- did clarify with Heme -Onc and Neuro pt will be going home on this med   ?Hx of calf DVT - hypercoag state due to malignancy  ?            -antiplatelet therapy: None ?3. Pain: continue Tylenol, tramadol as needed ? 3/25- pain controlled- took tramadol at home.  ?4. Mood: LCSW to evaluate patient and provide emotional support ?            -antipsychotic agents: N/A ?5. Neuropsych: This patient is capable of making decisions on his own behalf. ?6. Skin/Wound Care: Routine skin care checks ?7. Fluids/Electrolytes/Nutrition: Routine I's and O's and follow-up chemistries ?8. Hyperlipidemia: Continue Lipitor 40 mg daily ?            -- Increasing dose not recommended secondary to liver cancer ?9: DM-2: Continue sliding scale insulin, Semglee 16 units daily ?- Start metformin '500mg'$  daily.  ?            -- Carb modified diet ?            --On metformin 750 mg at home ?CBG (last 3)  ?Recent Labs  ?  02/05/22 ?1635 02/05/22 ?2101 02/06/22 ?0608  ?GLUCAP 107* 179* 101*  ?3/27 increase metformin to '500mg'$  BID ? ?10: BPH: Continue Flomax ?11: UTI: continue cefdinir through weekend ?            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            --follow-up with [name] 3/24, has CA  related hypercoagulable state  ? 38: Age indeterminate left peroneal vein DVT; diagnosed on 12/05/2021  initially on Eliquis>>now on Lovenox ?14: Prior CVA with history of L>R intra-atrial shunt ?            --  breakthrough CVA in 11/2021 ?            --continue Lovenox, statin for secondary prevention ?15. Nutrition ? 3/23 intake is good, eating 100% ?16. Hypertension: flowsheet reviewed and has been labile: continue to monitor BP TID ?17. Overweight BMI 26.29: provide dietary education ?  ? ? ?LOS: ?10 days ?A FACE TO FACE EVALUATION WAS PERFORMED ? ?Martha Clan P Josepha Barbier ?02/06/2022, 10:40 AM  ? ? ? ? ?

## 2022-02-07 LAB — GLUCOSE, CAPILLARY
Glucose-Capillary: 78 mg/dL (ref 70–99)
Glucose-Capillary: 129 mg/dL — ABNORMAL HIGH (ref 70–99)
Glucose-Capillary: 92 mg/dL (ref 70–99)

## 2022-02-07 MED ORDER — INSULIN GLARGINE-YFGN 100 UNIT/ML ~~LOC~~ SOLN
14.0000 [IU] | Freq: Every day | SUBCUTANEOUS | Status: DC
  Administered 2022-02-07 – 2022-02-09 (×3): 14 [IU] via SUBCUTANEOUS
  Filled 2022-02-07 (×3): qty 0.14

## 2022-02-07 NOTE — Progress Notes (Signed)
? ?                                                       PROGRESS NOTE ? ? ?Subjective/Complaints: ? ?No issues overnite  ?ROS: Patient denies CP, SOB, N/V/D ? ? ?Objective: ?  ?No results found. ?No results for input(s): WBC, HGB, HCT, PLT in the last 72 hours. ? ? ?No results for input(s): NA, K, CL, CO2, GLUCOSE, BUN, CREATININE, CALCIUM in the last 72 hours. ? ? ? ?Intake/Output Summary (Last 24 hours) at 02/07/2022 0744 ?Last data filed at 02/07/2022 0700 ?Gross per 24 hour  ?Intake 1380 ml  ?Output 450 ml  ?Net 930 ml  ? ?  ? ?  ? ?Physical Exam: ?Vital Signs ?Blood pressure 104/71, pulse 78, temperature 98.3 ?F (36.8 ?C), resp. rate 16, height '5\' 10"'$  (1.778 m), weight 83.1 kg, SpO2 100 %. ? ?General: No acute distress ?Mood and affect are appropriate ?Heart: Regular rate and rhythm no rubs murmurs or extra sounds ?Lungs: Clear to auscultation, breathing unlabored, no rales or wheezes ?Abdomen: Positive bowel sounds, soft nontender to palpation, nondistended ?Extremities: No clubbing, cyanosis, or edema ?Skin: ?   General: Skin is warm and dry.  ?   Comments: No breakdown  ?Neurological:  ?   Mental Status: Robert Warner is alert. Fair insight and awareness ?Motor: LLE: 4/5, LUE 3+/5, Right side 5/5. Sensation is mildly reduced on left.  ?Minimal left neglect present ? ?Assessment/Plan: ?1. Functional deficits which require 3+ hours per day of interdisciplinary therapy in a comprehensive inpatient rehab setting. ?Physiatrist is providing close team supervision and 24 hour management of active medical problems listed below. ?Physiatrist and rehab team continue to assess barriers to discharge/monitor patient progress toward functional and medical goals ? ?Care Tool: ? ?Bathing ?   ?Body parts bathed by patient: Right arm, Left arm, Face, Chest, Abdomen, Front perineal area, Right upper leg, Buttocks, Left upper leg, Right lower leg, Left lower leg  ?   ?  ?  ?Bathing assist Assist Level: Contact Guard/Touching assist ?  ?   ?Upper Body Dressing/Undressing ?Upper body dressing   ?What is the patient wearing?: Pull over shirt ?   ?Upper body assist Assist Level: Supervision/Verbal cueing ?   ?Lower Body Dressing/Undressing ?Lower body dressing ? ? ?   ?What is the patient wearing?: Pants ? ?  ? ?Lower body assist Assist for lower body dressing: Minimal Assistance - Patient > 75% ?   ? ?Toileting ?Toileting    ?Toileting assist Assist for toileting: Moderate Assistance - Patient 50 - 74% ?  ?  ?Transfers ?Chair/bed transfer ? ?Transfers assist ?   ? ?Chair/bed transfer assist level: Contact Guard/Touching assist ?  ?  ?Locomotion ?Ambulation ? ? ?Ambulation assist ? ?   ? ?Assist level: Contact Guard/Touching assist ?Assistive device: Walker-rolling ?Max distance: 180'  ? ?Walk 10 feet activity ? ? ?Assist ?   ? ?Assist level: Contact Guard/Touching assist ?Assistive device: Walker-rolling  ? ?Walk 50 feet activity ? ? ?Assist   ? ?Assist level: Contact Guard/Touching assist ?Assistive device: Walker-rolling  ? ? ?Walk 150 feet activity ? ? ?Assist Walk 150 feet activity did not occur: Safety/medical concerns ? ?Assist level: Contact Guard/Touching assist ?Assistive device: Walker-rolling ?  ? ?Walk 10 feet on uneven surface  ?activity ? ? ?  Assist Walk 10 feet on uneven surfaces activity did not occur: Safety/medical concerns ? ? ?Assist level: Minimal Assistance - Patient > 75% ?Assistive device: Walker-rolling  ? ?Wheelchair ? ? ? ? ?Assist Is the patient using a wheelchair?: Yes ?Type of Wheelchair: Manual ?  ? ?Wheelchair assist level: Maximal Assistance - Patient 25 - 49%, Total Assistance - Patient < 25% ?Max wheelchair distance: 110 ft  ? ? ?Wheelchair 50 feet with 2 turns activity ? ? ? ?Assist ? ?  ?  ? ? ?Assist Level: Maximal Assistance - Patient 25 - 49%, Total Assistance - Patient < 25%  ? ?Wheelchair 150 feet activity  ? ? ? ?Assist ?   ? ? ?Assist Level: Total Assistance - Patient < 25%  ? ?Blood pressure 104/71, pulse 78,  temperature 98.3 ?F (36.8 ?C), resp. rate 16, height '5\' 10"'$  (1.778 m), weight 83.1 kg, SpO2 100 %. ? ?Medical Problem List and Plan: ?1. Functional deficits secondary to embolic shower of right MCA territory- embolic , hypercoag state due to  ?            -patient may shower ?            -ELOS/Goals: 10-14 days S ?          -Continue CIR therapies including PT, OT, SLP ? ?2.  Antithrombotics: ?-DVT/anticoagulation:  Pharmaceutical: Lovenox 120 mg daily- did clarify with Heme -Onc and Neuro pt will be going home on this med   ?Hx of calf DVT - hypercoag state due to malignancy  ?            -antiplatelet therapy: None ?3. Pain: continue Tylenol, tramadol as needed ? 3/25- pain controlled- took tramadol at home.  ?4. Mood: LCSW to evaluate patient and provide emotional support ?            -antipsychotic agents: N/A ?5. Neuropsych: This patient is capable of making decisions on his own behalf. ?6. Skin/Wound Care: Routine skin care checks ?7. Fluids/Electrolytes/Nutrition: Routine I's and O's and follow-up chemistries ?8. Hyperlipidemia: Continue Lipitor 40 mg daily ?            -- Increasing dose not recommended secondary to liver cancer ?9: DM-2: Continue sliding scale insulin, Semglee 16 units daily ?- Start metformin '500mg'$  daily.  ?            -- Carb modified diet ?            --On metformin 750 mg at home ?CBG (last 3)  ?Recent Labs  ?  02/06/22 ?1644 02/06/22 ?2040 02/07/22 ?0559  ?GLUCAP 109* 147* 78  ? ?3/27 increase metformin to '500mg'$  BID- 3/28 am CBG on low side , reduce Semglee to 14U  ? ?10: BPH: Continue Flomax ?11: UTI: continue cefdinir through weekend ?            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            --follow-up with [name] 3/24, has CA related hypercoagulable state  ? 54: Age indeterminate left peroneal vein DVT; diagnosed on 12/05/2021  initially on Eliquis>>now on Lovenox ?14: Prior CVA with history  of L>R intra-atrial shunt ?            --breakthrough CVA in 11/2021 ?            --continue Lovenox, statin for secondary prevention ?15. Nutrition ? 3/23  intake is good, eating 100% ?16. Hypertension: flowsheet reviewed and has been labile: continue to monitor BP TID ?Vitals:  ? 02/06/22 1949 02/07/22 0353  ?BP: 107/73 104/71  ?Pulse: 79 78  ?Resp: 16 16  ?Temp: 97.9 ?F (36.6 ?C) 98.3 ?F (36.8 ?C)  ?SpO2: 99% 100%  ? ? ?17. Overweight BMI 26.29: provide dietary education ?  ? ? ?LOS: ?11 days ?A FACE TO FACE EVALUATION WAS PERFORMED ? ?Robert Warner ?02/07/2022, 7:44 AM  ? ? ? ? ?

## 2022-02-07 NOTE — Progress Notes (Signed)
Occupational Therapy Session Note ? ?Patient Details  ?Name: Robert Warner ?MRN: [id number] ?Date of Birth: 05-30-1941 ? ?Today's Date: 02/07/2022 ?OT Group Time: 4196-2229 ?OT Group Time Calculation (min): 60 min ? ? ?Short Term Goals: ?Week 2:  OT Short Term Goal 1 (Week 2): STG = LTG 2/2 ELOS ? ?Skilled Therapeutic Interventions/Progress Updates:  ?Pt participated in group session with a focus on w/c mgmt and BUE strength and endurance to facilitate improved activity tolerance and strength for higher level BADLs and functional mobility tasks.  ?Session started with therapeutic activity of engaging in seated corn hole game to promote improved attention to w/c mgmt and BUE coordination. Pt needed total A to reposition w/c when it was his turn and MOD cues to remember to lock brakes. Pt utilized RUE to toss bean bags. ?Pt also engaged in seated therapeutic activity game where pts were instructed to roll large dice,  once a number was determined each number correlated to an UB exercise and a number of reps, exercises included bicep curls, tricep extensions, upright rows, flys, chest presses and punches. Repetitions ranged from 10-20. ?Pt chose to use 1 lb weights during session, pt utilizing BUEs during therex. Eduction provided during activity of various modifications for all exercises.  ?Ended session with guide deep breathing for 1 min. Discussed benefits of deep breathing to manage stress and pain. Pt returned to room by this OTA.  ? ?Therapy Documentation ?Precautions:  ?Precautions ?Precautions: Fall ?Precaution Comments: L hemipareisis/ataxia, L inattention, poor safety awareness ?Restrictions ?Weight Bearing Restrictions: No ?  ?Pain: ?No pain reported during group session  ? ?Therapy/Group: Group Therapy ? ?[name] ?02/07/2022, 4:01 PM ?

## 2022-02-07 NOTE — Progress Notes (Signed)
Occupational Therapy Session Note ? ?Patient Details  ?Name: Robert Warner ?MRN: [id number] ?Date of Birth: 11/01/1941 ? ?Today's Date: 02/07/2022 ?OT Individual Time: 1337-1434 ?OT Individual Time Calculation (min): 57 min  ? ? ?Short Term Goals: ?Week 1:  OT Short Term Goal 1 (Week 1): Pt will don pants min A ?OT Short Term Goal 1 - Progress (Week 1): Met ?OT Short Term Goal 2 (Week 1): Pt will complete >2 grooming tasks standing at sink with CGA. ?OT Short Term Goal 2 - Progress (Week 1): Not met ?OT Short Term Goal 3 (Week 1): Pt will require no more than min VCs to safely position LUE while seated in chair. ?OT Short Term Goal 3 - Progress (Week 1): Not met ?Week 2:  OT Short Term Goal 1 (Week 2): STG = LTG 2/2 ELOS ? ?Skilled Therapeutic Interventions/Progress Updates:  ?  Pt received seated in recliner, agreeable to therapy. Session focus on self-care retraining, activity tolerance, LUE coordination, balance retraining in prep for improved ADL/IADL/func mobility performance + decreased caregiver burden. Short ambulatory transfer > w/c with CGA and RW. Total A w/c transport to and from gym. ? ?Stood at BITS to comlpete the following games and participated in various rounds of corn hole to target static standing balance and LUE coordination ? -Single User Target: 80% accuracy, 2.45 reaction time, 24 hits ?    -80% accuracy, 2.13 reaction time, 28 hits ? -Bell Cancellation Task: 2 min 42 seconds, 13 misses (on peripheral sides), 12 distracters hit; pt with difficulty following directs, noted to perseverate on rehitting already tapped targets ? ?-Min A for balance throughout, LUE coordination performance worsens with fatigue/internal distraction. ? ?- Pt able to transition to quadruped, but noted aggravation of hip "arthritis," so activity deferred. Completed 2x10 modified cobra push-ups to target proximal LUE strengthening. Requesting pain rx and RN present to administer. ? ?Finally, participated in 5 min of  continuous tossing/catching ball with BUE from various distances and angles, dropped ball less than >5 x. ? ?Ambulatory toilet transfer min A due to impulsivity and tendency to lift RW up, min A for toileting tasks to keep pants from falling down to ankles, continent void of bladder. S for seated pericare. ? ?Pt left seated in w/c with COTA for group session with all immediate needs met.  ? ? ?Therapy Documentation ?Precautions:  ?Precautions ?Precautions: Fall ?Precaution Comments: L hemipareisis/ataxia, L inattention, poor safety awareness ?Restrictions ?Weight Bearing Restrictions: No ? ?Pain: see session note ?  ?ADL: See Care Tool for more details. ? ?Therapy/Group: Individual Therapy ? ?Rachel A Stevenson MS, OTR/L ? ?02/07/2022, 6:49 AM ?

## 2022-02-07 NOTE — Progress Notes (Signed)
Physical Therapy Session Note ? ?Patient Details  ?Name: DAXON KYNE ?MRN: [id number] ?Date of Birth: Dec 10, 1940 ? ?Today's Date: 02/07/2022 ?PT Individual Time: 4818-5631 ?PT Individual Time Calculation (min): 56 min  ? ?Short Term Goals: ?Week 1:  PT Short Term Goal 1 (Week 1): Pt will demonstrate bed mobility with overall CGA. ?PT Short Term Goal 1 - Progress (Week 1): Met ?PT Short Term Goal 2 (Week 1): Pt will demonstrate consistent CGA with standing transfers while safely using LRAD. ?PT Short Term Goal 2 - Progress (Week 1): Met ?PT Short Term Goal 3 (Week 1): Pt will ambulate 150 feet consistently using LRAD with MinA. ?PT Short Term Goal 3 - Progress (Week 1): Met ?PT Short Term Goal 4 (Week 1): Pt will complete at least 4 steps using RHR to ascend with consistent MinA. ?PT Short Term Goal 4 - Progress (Week 1): Met ?Week 2:  PT Short Term Goal 1 (Week 2): =LTG due to ELOS ? ?Skilled Therapeutic Interventions/Progress Updates:  ?Patient supine in bed on entrance to room. Patient alert and agreeable to PT session. RN arrives at start of session to provide morning meds. Pt with improving impulsivity and continued coordintaion impairment. ? ?Patient with no pain complaint throughout session. ? ?Therapeutic Activity: ?Bed Mobility: Patient performed supine --> sit with Mod I/ supervision. Extra time but no vc required to complete. ?Transfers: Patient performed sit<>stand and stand pivot transfers throughout session with close supervision/ CGA for balance. Provided verbal cues for taking time during mobility to improve safety. Toilet transfer requires MinA for clothing mgmt. Transfer itself performed with close supervision. ?  ?Gait Training:  ?Patient ambulated 225' x2/ several short distances in therapy gym using no AD with CGA. Some misteps and crossover stepping, but pt able to self correct and regain balance. Provided vc/ tc for increased L foot clearance throughout. ? ?Neuromuscular Re-ed: ?NMR  facilitated during session with focus on standing balance and coordination. Pt guided in forward and lateral stepping through agility ladder. Ladder used for visual stimulus and guided path/ target. Pt with difficulty with NBOS required to step one foot in each square. MinA via HHA provided throughout.  NMR performed for improvements in motor control and coordination, balance, sequencing, judgement, and self confidence/ efficacy in performing all aspects of mobility at highest level of independence.  ? ?Patient seated upright with BLE extended  in recliner at end of session with brakes locked, belt alarm set, and all needs within reach. ? ? ?Therapy Documentation ?Precautions:  ?Precautions ?Precautions: Fall ?Precaution Comments: L hemipareisis/ataxia, L inattention, poor safety awareness ?Restrictions ?Weight Bearing Restrictions: No ?General: ?  ?Vital Signs: ?  ?Pain: ? No pain complaint this session.  ? ?Therapy/Group: Individual Therapy ? ?Alger Simons ?02/07/2022, 10:29 AM  ?

## 2022-02-08 ENCOUNTER — Telehealth: Payer: Self-pay | Admitting: *Deleted

## 2022-02-08 ENCOUNTER — Encounter: Payer: Self-pay | Admitting: Licensed Clinical Social Worker

## 2022-02-08 LAB — GLUCOSE, CAPILLARY
Glucose-Capillary: 91 mg/dL (ref 70–99)
Glucose-Capillary: 104 mg/dL — ABNORMAL HIGH (ref 70–99)
Glucose-Capillary: 95 mg/dL (ref 70–99)
Glucose-Capillary: 97 mg/dL (ref 70–99)

## 2022-02-08 NOTE — Plan of Care (Signed)
?  Problem: RH Dressing ?Goal: LTG Patient will perform lower body dressing w/assist (OT) ?Description: LTG: Patient will perform lower body dressing with assist, with/without cues in positioning using equipment (OT) ?Outcome: Not Met (add Reason) ?Flowsheets (Taken 02/08/2022 1437) ?LTG: Pt will perform lower body dressing with assistance level of: (Continues to require min A to thread BLE and for orientation of clothing.) -- ?  ?Problem: RH Toileting ?Goal: LTG Patient will perform toileting task (3/3 steps) with assistance level (OT) ?Description: LTG: Patient will perform toileting task (3/3 steps) with assistance level (OT)  ?Outcome: Not Met (add Reason) ?Flowsheets (Taken 02/08/2022 1437) ?LTG: Pt will perform toileting task (3/3 steps) with assistance level: (Continues to require up to min A for balance/clothing management and error awareness (attempting to amb prior to pulling up pants.)) -- ?  ?Problem: RH Awareness ?Goal: LTG: Patient will demonstrate awareness during functional activites type of (OT) ?Description: LTG: Patient will demonstrate awareness during functional activites type of (OT) ?Outcome: Not Met (add Reason) ?Flowsheets (Taken 02/08/2022 1437) ?LTG: Patient will demonstrate awareness during functional activites type of (OT): (Continues to require mod to max A to recognize safety concerns / error awarness during ADL/mobility performance.) -- ?  ?Problem: RH Balance ?Goal: LTG Patient will maintain dynamic standing with ADLs (OT) ?Description: LTG:  Patient will maintain dynamic standing balance with assist during activities of daily living (OT)  ?Outcome: Completed/Met ?  ?Problem: Sit to Stand ?Goal: LTG:  Patient will perform sit to stand in prep for activites of daily living with assistance level (OT) ?Description: LTG:  Patient will perform sit to stand in prep for activites of daily living with assistance level (OT) ?Outcome: Completed/Met ?  ?Problem: RH Grooming ?Goal: LTG Patient will  perform grooming w/assist,cues/equip (OT) ?Description: LTG: Patient will perform grooming with assist, with/without cues using equipment (OT) ?Outcome: Completed/Met ?  ?Problem: RH Bathing ?Goal: LTG Patient will bathe all body parts with assist levels (OT) ?Description: LTG: Patient will bathe all body parts with assist levels (OT) ?Outcome: Completed/Met ?  ?Problem: RH Dressing ?Goal: LTG Patient will perform upper body dressing (OT) ?Description: LTG Patient will perform upper body dressing with assist, with/without cues (OT). ?Outcome: Completed/Met ?  ?Problem: RH Functional Use of Upper Extremity ?Goal: LTG Patient will use RT/LT upper extremity as a (OT) ?Description: LTG: Patient will use right/left upper extremity as a stabilizer/gross assist/diminished/nondominant/dominant level with assist, with/without cues during functional activity (OT) ?Outcome: Completed/Met ?  ?Problem: RH Toilet Transfers ?Goal: LTG Patient will perform toilet transfers w/assist (OT) ?Description: LTG: Patient will perform toilet transfers with assist, with/without cues using equipment (OT) ?Outcome: Completed/Met ?  ?

## 2022-02-08 NOTE — Progress Notes (Signed)
Inpatient Rehabilitation Discharge Medication Review by a Pharmacist ? ?A complete drug regimen review was completed for this patient to identify any potential clinically significant medication issues. ? ?High Risk Drug Classes Is patient taking? Indication by Medication  ?Antipsychotic Yes ?prn compazine for N/V  ?Anticoagulant Yes Lovenox for DVT  ?Antibiotic No   ?Opioid No   ?Antiplatelet No   ?Hypoglycemics/insulin Yes Insulin, Metformin for DM  ?Vasoactive Medication No   ?Chemotherapy No   ?Other Yes Lipitor for HLD ?Protonix for Jerrye Bushy ?Rapaflo or Flomax for BPH  ? ? ? ?Type of Medication Issue Identified Description of Issue Recommendation(s)  ?Drug Interaction(s) (clinically significant) ?    ?Duplicate Therapy ?    ?Allergy ?    ?No Medication Administration End Date ?    ?Incorrect Dose ?    ?Additional Drug Therapy Needed ?    ?Significant med changes from prior encounter (inform family/care partners about these prior to discharge).    ?Other ?    ? ? ?Clinically significant medication issues were identified that warrant physician communication and completion of prescribed/recommended actions by midnight of the next day:  No ? ?Pharmacist comments: None ? ?Time spent performing this drug regimen review (minutes):  20 minutes ? ? ?Tad Moore ?02/08/2022 8:47 AM ?

## 2022-02-08 NOTE — Progress Notes (Signed)
Occupational Therapy Discharge Summary ? ?Patient Details  ?Name: Robert Warner ?MRN: [id number] ?Date of Birth: Apr 08, 1941 ? ?Today's Date: 02/08/2022 ?OT Individual Time: 1275-1700 ?OT Individual Time Calculation (min): 57 min + 28 min ? ? ?Patient has met 10 of 13 long term goals due to improved activity tolerance, improved balance, postural control, ability to compensate for deficits, functional use of  LEFT upper extremity, improved attention, improved awareness, and improved coordination.  Patient to discharge at overall Supervision to min A level.  Patient's care partner is independent to provide the necessary physical and cognitive assistance at discharge.  Pt to DC at the below ADL/bathroom transfer performance level. Assist levels represent pt's most consistent performance when alert/participatory. Pt continues to be primarily limited by L inattention, baseline cognitive deficits, decreased safety awareness/insight into deficits, L HP and ataxia. Family education completed via phone call with wife Arbie Cookey on 02/08/22 with caregivers demonstrating fair understanding of pt's current deficits  and impact on mobility/BADL/IADL performance and recs for 24/7 S and close S to [place] for all mobility. Pt and caregivers will benefit from continued [facility] OT to facilitate improved caregiver education, functional mobility, and occupational performance. Recommend 24/7 S assist. ? ? ?Reasons goals not met: Pt continues to require up to min A for toileting/LBD for steadying assist and error awareness (pulling up clothing all the way prior to exiting bathroom.) Requires mod to max A to facilitate emergent awareness of safety hazards/error awareness during BADL/mobility (pt did not recognize or self correct slipping forward on shower chair.)  ? ?Recommendation:  ?Patient will benefit from ongoing skilled OT services in home health setting to continue to advance functional skills in the area of BADL, iADL, and Reduce care partner  burden. ? ?Equipment: ?No equipment provided ? ?Reasons for discharge: treatment goals met and discharge from hospital ? ?Patient/family agrees with progress made and goals achieved: Yes ? ?OT Discharge ?Precautions/Restrictions  ?Precautions ?Precautions: Fall ?Precaution Comments: mild L inattention. L sided ataxia ?Restrictions ?Weight Bearing Restrictions: No ? ?Pain ?Pain Assessment ?Pain Scale: 0-10 ?Pain Score: 8  ?Pain Type: Chronic pain ?Pain Location: Hip ?Pain Orientation: Left ?Pain Descriptors / Indicators: Aching ?Pain Onset: On-going ?Patients Stated Pain Goal: 0 ?Pain Intervention(s): Medication (See eMAR);Ambulation/increased activity ?ADL ?ADL ?Eating: Set up ?Where Assessed-Eating: Chair ?Grooming: Supervision/safety ?Where Assessed-Grooming: Sitting at sink ?Upper Body Bathing: Supervision/safety ?Where Assessed-Upper Body Bathing: Sitting at sink ?Lower Body Bathing: Contact guard ?Where Assessed-Lower Body Bathing: Sitting at sink, Shower ?Upper Body Dressing: Supervision/safety ?Where Assessed-Upper Body Dressing: Sitting at sink ?Lower Body Dressing: Minimal assistance ?Where Assessed-Lower Body Dressing: Edge of bed ?Toileting: Minimal assistance ?Where Assessed-Toileting: Toilet, Bedside Commode ?Toilet Transfer: Contact guard ?Toilet Transfer Method: Ambulating ?Science writer: Bedside commode, Grab bars ?Tub/Shower Transfer: Not assessed ?Walk-In Shower Transfer: Contact guard ?Walk-In Shower Transfer Method: Ambulating ?Walk-In Shower Equipment: Civil engineer, contracting with back, Grab bars ?Vision ?Baseline Vision/History: 1 Wears glasses ?Patient Visual Report: No change from baseline ?Vision Assessment?: Yes ?Eye Alignment: Within Functional Limits ?Ocular Range of Motion: Within Functional Limits ?Alignment/Gaze Preference: Within Defined Limits ?Tracking/Visual Pursuits: Decreased smoothness of horizontal tracking;Decreased smoothness of vertical tracking ?Saccades: Within functional  limits ?Convergence: Within functional limits ?Visual Fields: Impaired-to be further tested in functional context (L inattention) ?Perception  ?Perception: Impaired ?Inattention/Neglect: Does not attend to left visual field ?Comments: continues to require cues to locate items on L side, LUE ataxia worsens with fatigue/inattention ?Praxis ?Praxis: Impaired ?Praxis Impairment Details: Ideomotor;Perseveration ?Praxis-Other Comments: perseverates with bathing same body parts/  applying deodorant, difficulty orientating clothing with poor error awareness ?Cognition ?Cognition ?Overall Cognitive Status: History of cognitive impairments - at baseline ?Arousal/Alertness: Awake/alert ?Orientation Level: Person;Place;Situation ?Person: Oriented ?Place: Oriented ?Situation: Oriented ?Memory: Impaired ?Memory Impairment: Decreased recall of new information;Storage deficit;Retrieval deficit ?Attention: Sustained ?Sustained Attention: Impaired ?Sustained Attention Impairment: Verbal basic;Functional basic ?Selective Attention: Impaired ?Selective Attention Impairment: Verbal basic;Functional basic ?Awareness: Impaired ?Awareness Impairment: Intellectual impairment ?Problem Solving: Appears intact ?Problem Solving Impairment: Verbal basic;Functional basic ?Behaviors: Impulsive ?Safety/Judgment: Impaired ?Comments: Midly impulsive with mobility, impaired safety awareness ?Brief Interview for Mental Status (BIMS) ?Repetition of Three Words (First Attempt): 3 ?Temporal Orientation: Year: Correct ?Temporal Orientation: Month: Accurate within 5 days ?Temporal Orientation: Day: Incorrect ?Recall: "Sock": No, could not recall ?Recall: "Blue": Yes, no cue required ?Recall: "Bed": Yes, no cue required ?BIMS Summary Score: 12 ?Sensation ?Sensation ?Light Touch: Impaired Detail ?Light Touch Impaired Details: Impaired LUE;Impaired LLE ?Hot/Cold: Appears Intact ?Proprioception: Impaired Detail ?Proprioception Impaired Details: Impaired  LLE;Impaired LUE ?Stereognosis: Impaired Detail ?Additional Comments: ataxia on the L, UE>LE ?Coordination ?Gross Motor Movements are Fluid and Coordinated: No ?Fine Motor Movements are Fluid and Coordinated: No ?Coordination and Movement Description: ataxia LUE>LLE ?Finger Nose Finger Test: ataxic on L ?Heel Shin Test: ataxia on the LLE ?9 Hole Peg Test: L: able to remove all pegs and place 7/9 with L hand within 4 min 15 seconds ?Motor  ?Motor ?Motor: Other (comment);Ataxia ?Motor - Discharge Observations: LUE>LLE ataxia ?Mobility  ?Bed Mobility ?Bed Mobility: Supine to Sit;Sit to Supine ?Supine to Sit: Independent with assistive device ?Sit to Supine: Independent with assistive device ?Transfers ?Sit to Stand: Supervision/Verbal cueing ?Stand to Sit: Supervision/Verbal cueing  ?Trunk/Postural Assessment  ?Cervical Assessment ?Cervical Assessment: Exceptions to [facility] (forward head) ?Thoracic Assessment ?Thoracic Assessment: Exceptions to [facility] (rounded shoulders) ?Lumbar Assessment ?Lumbar Assessment: Exceptions to [facility] (posterior pelvic tilt) ?Postural Control ?Postural Control: Within Functional Limits  ?Balance ?Balance ?Balance Assessed: Yes ?Static Sitting Balance ?Static Sitting - Balance Support: Feet supported ?Static Sitting - Level of Assistance: 6: Modified independent (Device/Increase time) ?Dynamic Sitting Balance ?Dynamic Sitting - Balance Support: Feet supported ?Dynamic Sitting - Level of Assistance: 5: Stand by assistance ?Static Standing Balance ?Static Standing - Balance Support: Bilateral upper extremity supported ?Static Standing - Level of Assistance: 5: Stand by assistance ?Dynamic Standing Balance ?Dynamic Standing - Balance Support: Bilateral upper extremity supported ?Dynamic Standing - Level of Assistance: 5: Stand by assistance;4: Min assist ?Extremity/Trunk Assessment ?RUE Assessment ?RUE Assessment: Within Functional Limits ?LUE Assessment ?LUE Assessment: Exceptions to [facility] ?General  Strength Comments: mild ataxia ?LUE Body System: Neuro ?Brunstrum levels for arm and hand: Arm;Hand ?Brunstrum level for arm: Stage V Relative Independence from [employer] ?Brunstrum level for hand: Stage VI Isolated joi

## 2022-02-08 NOTE — Telephone Encounter (Signed)
Robert Warner is aware of f/u on 02/17/22 and will call if there is any difficulty in getting him here. Plan to d/c to home on 02/09/22. ?

## 2022-02-08 NOTE — Progress Notes (Signed)
Wife educated with lovenox  shot and she did the lovenox shot to patient herself. No questions noted. ?

## 2022-02-08 NOTE — Patient Care Conference (Signed)
Inpatient RehabilitationTeam Conference and Plan of Care Update ?Date: 02/08/2022   Time: 10:04 AM  ? ? ?Patient Name: Robert Warner      ?Medical Record Number: [id number]  ?Date of Birth: 01/11/1941 ?Sex: Male         ?Room/Bed: 4M11C/4M11C-01 ?Payor Info: Payor: [employer] MEDICARE / Plan: [employer] / Product Type: *No Product type* /   ? ?Admit Date/Time:  01/27/2022  4:38 PM ? ?Primary Diagnosis:  CVA (cerebral vascular accident) ([place]) ? ?Hospital Problems: Principal Problem: ?  CVA (cerebral vascular accident) ([place]) ? ? ? ?Expected Discharge Date: Expected Discharge Date: 02/09/22 ? ?Team Members Present: ?Physician leading conference: Dr. Alysia Penna ?Social Worker Present: Erlene Quan, BSW ?Nurse Present: Dorien Chihuahua, RN ?PT Present: Barrie Folk, PT ?OT Present: Providence Lanius, OT ?SLP Present: Sherren Kerns, SLP ?PPS Coordinator present : Ileana Ladd, PT ? ?   Current Status/Progress Goal Weekly Team Focus  ?Bowel/Bladder ? ? Continent of Bowel and Bladder using urinal independently  Continue at home to maintain continence  Continue at home to maintain continence   ?Swallow/Nutrition/ Hydration ? ?           ?ADL's ? ? S bathing, UBD, mod LBD, min A toileting, ambulatory bathroom transfers with RW; LUE ataxia and poor safety awareness/insight into deficits  CGA transfers, S ADL  LUE NMR, transfer retraining, pt/family education, self-care/balance retraining, DME/AE educaiton   ?Mobility ? ? superivsion-CGA with RW or HHA for gait. supevrision assist transfers. mod I bed mobility  supervision-CGA for trasnfers, gait, and stairs.  safety. improved coordiantoion, transfers to and from bed. education and discharge planning.   ?Communication ? ? able to express needs clearly  able to express needs and wants and follows simple commands  able to express needs and wants and follows simple commands   ?Safety/Cognition/ Behavioral Observations ? min A basic, mod A for higher level cognitive  skills  sup-to-min A  compensatory memory, attention, problem solving   ?Pain ? ? No pain stated  No pain stated  assess and treat pain as needed and ordered   ?Skin ? ? Intact  assess and keep in tact  Assess Q shift and turn pt Q2  housr   ? ? ?Discharge Planning:  ?discharging home with spouse on thurs   ?Team Discussion: ?Patient doing well given poor insight, poor safety awareness and attention with left ataxia and fatigue. ?Patient on target to meet rehab goals: ?yes, currently needs CGA for toileting and min assist for lower body care. Completes upper body with supervision and CGA for showering.  Needs supervision for transfers, CGA for gait. Able to complete steps. Needs min assist for cognition overall however needs mod assist for high level functions. Goals for discharge set for supervision overall. ? ?*See Care Plan and progress notes for long and short-term goals.  ? ?Revisions to Treatment Plan:  ?N/A  ?Teaching Needs: ?Safety, transfers, medications, lovenox injections, dietary modifications, etc  ?Current Barriers to Discharge: ?Decreased caregiver support ? ?Possible Resolutions to Barriers: ?Family education; 24/7 supervision ?HEP ?HH follow up services ?DME: RW ?  ? ? Medical Summary ?Current Status: DM improved control, HTN still mildly labile ? Barriers to Discharge: Other (comments) ? Barriers to Discharge Comments: pt/family teaching on Lovenox administration ?Possible Resolutions to [employer] Focus: see about plan d/c in am ? ? ?Continued Need for Acute Rehabilitation Level of Care: The patient requires daily medical management by a physician with specialized training in physical medicine and  rehabilitation for the following reasons: ?Direction of a multidisciplinary physical rehabilitation program to maximize functional independence : Yes ?Medical management of patient stability for increased activity during participation in an intensive rehabilitation regime.: Yes ?Analysis of  laboratory values and/or radiology reports with any subsequent need for medication adjustment and/or medical intervention. : Yes ? ? ?I attest that I was present, lead the team conference, and concur with the assessment and plan of the team. ? ? ?Dorien Chihuahua B ?02/08/2022, 3:37 PM  ? ? ? ? ? ? ?

## 2022-02-08 NOTE — Progress Notes (Signed)
Physical Therapy Discharge Summary ? ?Patient Details  ?Name: Robert Warner ?MRN: [id number] ?Date of Birth: [birth year] ? ?Today's Date: 02/08/2022 ?PT Individual Time: 6160-7371 ?PT Individual Time Calculation (min): 53 min  ? ? ?Patient has met 10 of 10 long term goals due to improved activity tolerance, improved balance, improved postural control, increased strength, ability to compensate for deficits, functional use of  left upper extremity and left lower extremity, improved attention, improved awareness, and improved coordination.  Patient to discharge at an ambulatory level [place] assist with RW for safety.   Patient's care partner is independent to provide the necessary physical assistance at discharge. ? ?Reasons goals not met: All PT goals met  ? ?Recommendation:  ?Patient will benefit from ongoing skilled PT services in home health setting to continue to advance safe functional mobility, address ongoing impairments in balance, coordination safety, transfers, awareness, L inattention and minimize fall risk. ? ?Equipment: ?RW ? ?Reasons for discharge: treatment goals met and discharge from hospital ? ?Patient/family agrees with progress made and goals achieved: Yes ? ?PT treatment: ?Pt received sitting in recliner and agreeable to PT. PT instructed pt in Grad day assessment to measure progress toward goals. See below for details. Pt performed gait training through halls with RW and supervision assist with occasional CGA for safety with cues for improved step width, safety with AD in turns, and improved posture to reduce foot drag on the LLE. Stair and ramp training as listed below. Car transfer with RW and supervision assist with cues for improved position on ar seat to reduce fall risk. Pt reports need for urination. Ambulatory transfer to [facility] over toilet with supervision assist. Pt able to void bladder. Min assist for clothing management standing at toilet upon completion and 1 UE  supported on rail in bath room. PT instructed pt in modified [place] level A balance program with hand out provided.  ? ? ? ?PT Discharge ?Precautions/Restrictions ?Precautions ?Precautions: Fall ?Precaution Comments: mild L inattention. L sided ataxia ?Restrictions ?Weight Bearing Restrictions: No ? ?Pain ?Pain Assessment ?Pain Scale: 0-10 ?Pain Score: 8  ?Pain Type: Chronic pain ?Pain Location: Hip ?Pain Orientation: Left ?Pain Descriptors / Indicators: Aching ?Pain Onset: On-going ?Patients Stated Pain Goal: 0 ?Pain Intervention(s): Medication (See eMAR);Ambulation/increased activity ?Pain Interference ?Pain Interference ?Pain Effect on Sleep: 2. Occasionally ?Pain Interference with Therapy Activities: 2. Occasionally ?Pain Interference with Day-to-Day Activities: 1. Rarely or not at all ?Vision/Perception  ?Vision - History ?Ability to See in Adequate Light: 0 Adequate ?Vision - Assessment ?Eye Alignment: Within Functional Limits ?Ocular Range of Motion: Within Functional Limits ?Alignment/Gaze Preference: Within Defined Limits ?Tracking/Visual Pursuits: Decreased smoothness of horizontal tracking;Decreased smoothness of vertical tracking ?Saccades: Within functional limits ?Convergence: Within functional limits ?Perception ?Perception: Impaired ?Inattention/Neglect: Does not attend to left visual field ?Praxis ?Praxis: Intact  ?Cognition ?Overall Cognitive Status: History of cognitive impairments - at baseline ?Arousal/Alertness: Awake/alert ?Orientation Level: Oriented to person;Oriented to place;Oriented to time ?Attention: Sustained ?Sustained Attention: Impaired ?Sustained Attention Impairment: Verbal basic;Functional basic ?Memory: Impaired ?Memory Impairment: Decreased recall of new information;Storage deficit;Retrieval deficit ?Awareness Impairment: Intellectual impairment ?Problem Solving: Impaired ?Problem Solving Impairment: Verbal basic;Functional basic ?Behaviors: Impulsive ?Safety/Judgment:  Impaired ?Comments: Midly impulsive with mobility, impaired safety awareness ?Sensation ?Sensation ?Light Touch: Impaired Detail ?Proprioception: Impaired Detail ?Proprioception Impaired Details: Impaired LLE;Impaired LUE ?Additional Comments: ataxia on the L, UE>LE ?Coordination ?Gross Motor Movements are Fluid and Coordinated: No ?Fine Motor Movements are Fluid and Coordinated: No ?Coordination and Movement Description: ataxia LUE>LLE ?  Finger Nose Finger Test: ataxic ?Heel Shin Test: ataxia on the LLE ?Motor  ?Motor ?Motor: Other (comment);Ataxia ?Motor - Discharge Observations: LUE>LLE ataxia  ?Mobility ?Bed Mobility ?Bed Mobility: Supine to Sit;Sit to Supine ?Supine to Sit: Independent with assistive device ?Sit to Supine: Independent with assistive device ?Transfers ?Sit to Stand: Supervision/Verbal cueing ?Stand to Sit: Supervision/Verbal cueing ?Stand Pivot Transfers: Supervision/Verbal cueing ?Transfer (Assistive device): Rolling walker ?Locomotion  ?Gait ?Ambulation: Yes ?Gait Assistance: Supervision/Verbal cueing;Contact Guard/Touching assist ?Gait Distance (Feet): 180 Feet ?Assistive device: Rolling walker ?Gait Assistance Details: Verbal cues for gait pattern;Verbal cues for safe use of DME/AE;Verbal cues for precautions/safety ?Gait ?Gait: Yes ?Gait Pattern: Impaired ?Gait Pattern: Ataxic ?Stairs / Additional Locomotion ?Stairs: Yes ?Stairs Assistance: Contact Guard/Touching assist;Supervision/Verbal cueing ?Stair Management Technique: Two rails ?Number of Stairs: 12 ?Height of Stairs: 6 ?Ramp: Supervision/Verbal cueing ?Pick up small object from the floor assist level: Supervision/Verbal cueing ?Pick up small object from the floor assistive device: with UE support on RW ?Wheelchair Mobility ?Wheelchair Mobility: No  ?Trunk/Postural Assessment  ?Cervical Assessment ?Cervical Assessment: Exceptions to [facility] ?Thoracic Assessment ?Thoracic Assessment: Exceptions to [facility] ?Lumbar Assessment ?Lumbar Assessment:  Exceptions to [facility] (posterior pelvic tilt) ?Postural Control ?Postural Control: Within Functional Limits  ?Balance ?Balance ?Balance Assessed: Yes ?Static Sitting Balance ?Static Sitting - Balance Support: Feet unsupported ?Static Sitting - Level of Assistance: 6: Modified independent (Device/Increase time) ?Dynamic Sitting Balance ?Dynamic Sitting - Balance Support: Feet supported ?Dynamic Sitting - Level of Assistance: 6: Modified independent (Device/Increase time) ?Static Standing Balance ?Static Standing - Balance Support: Bilateral upper extremity supported ?Static Standing - Level of Assistance: 5: Stand by assistance ?Dynamic Standing Balance ?Dynamic Standing - Balance Support: Bilateral upper extremity supported ?Dynamic Standing - Level of Assistance: 5: Stand by assistance;4: Min assist ?Extremity Assessment  ?  ?  ?RLE Assessment ?RLE Assessment: Within Functional Limits ?LLE Assessment ?General Strength Comments: MMT is strong, however functionally, pt's LLE decreases to 4/ 5 grosly ?LLE Strength ?Left Hip Flexion: 4+/5 ?Left Hip ABduction: 4+/5 ?Left Hip ADduction: 4+/5 ?Left Knee Flexion: 4+/5 ?Left Knee Extension: 4+/5 ?Left Ankle Dorsiflexion: 4+/5 ?Left Ankle Plantar Flexion: 4+/5 ? ? ? ?Lorie Phenix ?02/08/2022, 10:32 AM ?

## 2022-02-08 NOTE — Plan of Care (Signed)
?  Problem: RH Awareness Goal: LTG: Patient will demonstrate awareness during functional activites type of (SLP) Description: LTG: Patient will demonstrate awareness during functional activites type of (SLP) Outcome: Not Met (add Reason)   Problem: RH Problem Solving Goal: LTG Patient will demonstrate problem solving for (SLP) Description: LTG:  Patient will demonstrate problem solving for basic/complex daily situations with cues  (SLP) Outcome: Completed/Met   Problem: RH Memory Goal: LTG Patient will use memory compensatory aids to (SLP) Description: LTG:  Patient will use memory compensatory aids to recall biographical/new, daily complex information with cues (SLP) Outcome: Completed/Met   Problem: RH Attention Goal: LTG Patient will demonstrate this level of attention during functional activites (SLP) Description: LTG:  Patient will will demonstrate this level of attention during functional activites (SLP) Outcome: Completed/Met   

## 2022-02-08 NOTE — Plan of Care (Signed)
?  Problem: RH Balance ?Goal: LTG Patient will maintain dynamic sitting balance (PT) ?Description: LTG:  Patient will maintain dynamic sitting balance with assistance during mobility activities (PT) ?Outcome: Completed/Met ?Goal: LTG Patient will maintain dynamic standing balance (PT) ?Description: LTG:  Patient will maintain dynamic standing balance with assistance during mobility activities (PT) ?Outcome: Completed/Met ?  ?Problem: Sit to Stand ?Goal: LTG:  Patient will perform sit to stand with assistance level (PT) ?Description: LTG:  Patient will perform sit to stand with assistance level (PT) ?Outcome: Completed/Met ?  ?Problem: RH Bed Mobility ?Goal: LTG Patient will perform bed mobility with assist (PT) ?Description: LTG: Patient will perform bed mobility with assistance, with/without cues (PT). ?Outcome: Completed/Met ?  ?Problem: RH Bed to Chair Transfers ?Goal: LTG Patient will perform bed/chair transfers w/assist (PT) ?Description: LTG: Patient will perform bed to chair transfers with assistance (PT). ?Outcome: Completed/Met ?  ?Problem: RH Ambulation ?Goal: LTG Patient will ambulate in controlled environment (PT) ?Description: LTG: Patient will ambulate in a controlled environment, # of feet with assistance (PT). ?Outcome: Completed/Met ?Goal: LTG Patient will ambulate in home environment (PT) ?Description: LTG: Patient will ambulate in home environment, # of feet with assistance (PT). ?Outcome: Completed/Met ?  ?Problem: RH Wheelchair Mobility ?Goal: LTG Patient will propel w/c in controlled environment (PT) ?Description: LTG: Patient will propel wheelchair in controlled environment, # of feet with assist (PT) ?Outcome: Completed/Met ?  ?Problem: RH Stairs ?Goal: LTG Patient will ambulate up and down stairs w/assist (PT) ?Description: LTG: Patient will ambulate up and down # of stairs with assistance (PT) ?Outcome: Completed/Met ?  ?Problem: RH Car Transfers ?Goal: LTG Patient will perform car transfers  with assist (PT) ?Description: LTG: Patient will perform car transfers with assistance (PT). ?Outcome: Completed/Met ?  ?

## 2022-02-08 NOTE — Progress Notes (Signed)
Speech Language Pathology Discharge Summary ? ?Patient Details  ?Name: Robert Warner ?MRN: [id number] ?Date of Birth: [birth year] ? ?Today's Date: 02/08/2022 ?SLP Individual Time: 9476-5465 ?SLP Individual Time Calculation (min): 45 min ? ?Skilled Therapeutic Interventions: Skilled ST treatment focused on cognitive goals. SLP re-administered the [place] (SLUMS). Patient scored a 16/30 points with 27 or above considered within the normal range. Score suggestive of mild improvements, as pt scored 14/30 points on initial evaluation obtained on 3/21. Patient continues to exhibit decreased sustained attention, processing, problem solving, and intellectual awareness which appears to fluctuate with fatigue. Discussed results with patient, as well as provided education on strategies to maximize attention and memory. SLP recommends follow up therapy to further address cognitive-communication deficits. Pt verbalized understanding and agreement. Pt ambulated to bathroom using RW at end of session and required sup A verbal cues to bring attention to obstacles in path. Pt required min A for awareness that pants and brief were still around ankles as he attempted to ambulate out of bathroom. Min A required to pull up pants. Pt ambulated back to recliner chair and left with alarm activated and immediate needs within reach at end of session.  ? ?Patient has met 3 of 4 long term goals.  Patient to discharge at [facility] level.  ?Reasons goals not met: slower than anticipated progress with awareness; progress also suspected to be reduced due to baseline cognitive deficits  ? ?Clinical Impression/Discharge Summary: Patient has made functional gains and has met 3 of 4 long-term goals this admission. Patient is currently completing functional and basic cognitive tasks with sup-to-min A cues in regards to basic functional problem solving, sustained attention, recall with use of compensatory strategies, and  intellectual awareness. Patient and family education is complete and patient to discharge at overall min A level. Patient's care partner is independent to provide the necessary physical and cognitive assistance at discharge. Patient would benefit from continued SLP services in home health setting to maximize cognitive-communication function, safety, and functional independence.   ? ?Care Partner:  ?Caregiver Able to Provide Assistance: Yes  ?Type of Caregiver Assistance: Physical;Cognitive ? ?Recommendation:  ?24 hour supervision/assistance;Home Health SLP  ?Rationale for SLP Follow Up: Maximize cognitive function and independence;Reduce caregiver burden  ? ?Equipment: None  ? ?Reasons for discharge: Discharged from hospital;Treatment goals met  ? ?Patient/Family Agrees with Progress Made and Goals Achieved: Yes  ? ? ?[name] ?02/08/2022, 3:33 PM ? ?

## 2022-02-08 NOTE — Progress Notes (Signed)
? ?                                                       PROGRESS NOTE ? ? ?Subjective/Complaints: ? ?Slept well, no new issues , discussed d/c process ? ?ROS: Patient denies CP, SOB, N/V/D ? ? ?Objective: ?  ?No results found. ?No results for input(s): WBC, HGB, HCT, PLT in the last 72 hours. ? ? ?No results for input(s): NA, K, CL, CO2, GLUCOSE, BUN, CREATININE, CALCIUM in the last 72 hours. ? ? ? ?Intake/Output Summary (Last 24 hours) at 02/08/2022 0723 ?Last data filed at 02/07/2022 1300 ?Gross per 24 hour  ?Intake 120 ml  ?Output --  ?Net 120 ml  ? ?  ? ?  ? ?Physical Exam: ?Vital Signs ?Blood pressure (!) 149/91, pulse 92, temperature 98 ?F (36.7 ?C), resp. rate 17, height '5\' 10"'$  (1.778 m), weight 83.1 kg, SpO2 99 %. ? ?General: No acute distress ?Mood and affect are appropriate ?Heart: Regular rate and rhythm no rubs murmurs or extra sounds ?Lungs: Clear to auscultation, breathing unlabored, no rales or wheezes ?Abdomen: Positive bowel sounds, soft nontender to palpation, nondistended ?Extremities: No clubbing, cyanosis, or edema ?Skin: ?   General: Skin is warm and dry.  ?   Comments: No breakdown  ?Neurological:  ?   Mental Status: He is alert. Fair insight and awareness ?Motor: LLE: 4/5, LUE 3+/5, Right side 5/5. Sensation is mildly reduced on left.  ?Minimal left neglect present ? ?Assessment/Plan: ?1. Functional deficits which require 3+ hours per day of interdisciplinary therapy in a comprehensive inpatient rehab setting. ?Physiatrist is providing close team supervision and 24 hour management of active medical problems listed below. ?Physiatrist and rehab team continue to assess barriers to discharge/monitor patient progress toward functional and medical goals ? ?Care Tool: ? ?Bathing ?   ?Body parts bathed by patient: Right arm, Left arm, Face, Chest, Abdomen, Front perineal area, Right upper leg, Buttocks, Left upper leg, Right lower leg, Left lower leg  ?   ?  ?  ?Bathing assist Assist Level: Contact  Guard/Touching assist ?  ?  ?Upper Body Dressing/Undressing ?Upper body dressing   ?What is the patient wearing?: Pull over shirt ?   ?Upper body assist Assist Level: Supervision/Verbal cueing ?   ?Lower Body Dressing/Undressing ?Lower body dressing ? ? ?   ?What is the patient wearing?: Pants ? ?  ? ?Lower body assist Assist for lower body dressing: Minimal Assistance - Patient > 75% ?   ? ?Toileting ?Toileting    ?Toileting assist Assist for toileting: Moderate Assistance - Patient 50 - 74% ?  ?  ?Transfers ?Chair/bed transfer ? ?Transfers assist ?   ? ?Chair/bed transfer assist level: Contact Guard/Touching assist ?  ?  ?Locomotion ?Ambulation ? ? ?Ambulation assist ? ?   ? ?Assist level: Contact Guard/Touching assist ?Assistive device: Walker-rolling ?Max distance: 180'  ? ?Walk 10 feet activity ? ? ?Assist ?   ? ?Assist level: Contact Guard/Touching assist ?Assistive device: Walker-rolling  ? ?Walk 50 feet activity ? ? ?Assist   ? ?Assist level: Contact Guard/Touching assist ?Assistive device: Walker-rolling  ? ? ?Walk 150 feet activity ? ? ?Assist Walk 150 feet activity did not occur: Safety/medical concerns ? ?Assist level: Contact Guard/Touching assist ?Assistive device: Walker-rolling ?  ? ?Walk 10 feet  on uneven surface  ?activity ? ? ?Assist Walk 10 feet on uneven surfaces activity did not occur: Safety/medical concerns ? ? ?Assist level: Minimal Assistance - Patient > 75% ?Assistive device: Walker-rolling  ? ?Wheelchair ? ? ? ? ?Assist Is the patient using a wheelchair?: Yes ?Type of Wheelchair: Manual ?  ? ?Wheelchair assist level: Maximal Assistance - Patient 25 - 49%, Total Assistance - Patient < 25% ?Max wheelchair distance: 110 ft  ? ? ?Wheelchair 50 feet with 2 turns activity ? ? ? ?Assist ? ?  ?  ? ? ?Assist Level: Maximal Assistance - Patient 25 - 49%, Total Assistance - Patient < 25%  ? ?Wheelchair 150 feet activity  ? ? ? ?Assist ?   ? ? ?Assist Level: Total Assistance - Patient < 25%  ? ?Blood  pressure (!) 149/91, pulse 92, temperature 98 ?F (36.7 ?C), resp. rate 17, height '5\' 10"'$  (1.778 m), weight 83.1 kg, SpO2 99 %. ? ?Medical Problem List and Plan: ?1. Functional deficits secondary to embolic shower of right MCA territory- embolic , hypercoag state due to  ?            -patient may shower ?            -ELOS/Goals: 10-14 days S ?          -Continue CIR therapies including PT, OT, SLP ? ?2.  Antithrombotics: ?-DVT/anticoagulation:  Pharmaceutical: Lovenox 120 mg daily- did clarify with Heme -Onc and Neuro pt will be going home on this med   ?Hx of calf DVT - hypercoag state due to malignancy  ?            -antiplatelet therapy: None ?3. Pain: continue Tylenol, tramadol as needed ? 3/25- pain controlled- took tramadol at home.  ?4. Mood: LCSW to evaluate patient and provide emotional support ?            -antipsychotic agents: N/A ?5. Neuropsych: This patient is capable of making decisions on his own behalf. ?6. Skin/Wound Care: Routine skin care checks ?7. Fluids/Electrolytes/Nutrition: Routine I's and O's and follow-up chemistries ?8. Hyperlipidemia: Continue Lipitor 40 mg daily ?            -- Increasing dose not recommended secondary to liver cancer ?9: DM-2: Continue sliding scale insulin, Semglee 16 units daily ?- Start metformin '500mg'$  daily.  ?            -- Carb modified diet ?            --On metformin 750 mg at home ?CBG (last 3)  ?Recent Labs  ?  02/07/22 ?1156 02/07/22 ?1643 02/08/22 ?3818  ?GLUCAP 129* 92 91  ? ?3/27 increase metformin to '500mg'$  BID- 3/28 am CBG on low side , reduce Semglee to 14U  ?Controlled 3/29 ? ?10: BPH: Continue Flomax ?11: UTI: continue cefdinir through weekend ?            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            --follow-up with [name] 3/24, has CA related hypercoagulable state  ? 53: Age indeterminate left peroneal vein DVT; diagnosed on 12/05/2021  initially on  Eliquis>>now on Lovenox ?14: Prior CVA with history of L>R intra-atrial shunt ?            --breakthrough CVA in 11/2021 ?            --  continue Lovenox, statin for secondary prevention ?15. Nutrition ? 3/23 intake is good, eating 100% ?16. Hypertension: flowsheet reviewed and has been labile: continue to monitor BP TID ?Vitals:  ? 02/07/22 2139 02/08/22 0358  ?BP: (!) 137/95 (!) 149/91  ?Pulse: 88 92  ?Resp: 17 17  ?Temp: 98.4 ?F (36.9 ?C) 98 ?F (36.7 ?C)  ?SpO2: 97% 99%  ?Fair control 3/29 ? ?17. Overweight BMI 26.29: provide dietary education ?  ? ? ?LOS: ?12 days ?A FACE TO FACE EVALUATION WAS PERFORMED ? ?[name] Ayra Hodgdon ?02/08/2022, 7:23 AM  ? ? ? ? ?

## 2022-02-08 NOTE — Progress Notes (Addendum)
Patient ID: Robert Warner, male   DOB: April 17, 1941, 81 y.o.   MRN: [id number] ? ?Team Conference Report to Patient/Family ? ?Team Conference discussion was reviewed with the patient and caregiver, including goals, any changes in plan of care and target discharge date.  Patient and caregiver express understanding and are in agreement.  The patient has a target discharge date of 02/09/22. ? ?SW spoke with patient spouse and informed spouse that patient is medically stable for discharge on tomorrow. Patient has been established with [facility] with [facility]. [facility] services will begin on Saturday 4/1. Sw informed patient spouse of the d/c process and the patient is now requiring a RW, spouse reports that patient has all DME.  ? ?[name] ?02/08/2022, 2:04 PM  ?

## 2022-02-08 NOTE — Progress Notes (Signed)
[facility] CSW Progress Note ? ?Clinical Social Worker contacted caregiver by phone to follow-up on scheduled appointment. Ms. [place] stated the patient would discharge from CIR this week and she would contact CSW next Tuesday to reschedule appointment. ? ? ? ?Elisabel Hanover , LCSW ?

## 2022-02-09 ENCOUNTER — Other Ambulatory Visit: Payer: Self-pay | Admitting: Physician Assistant

## 2022-02-09 ENCOUNTER — Other Ambulatory Visit: Payer: Self-pay | Admitting: *Deleted

## 2022-02-09 ENCOUNTER — Other Ambulatory Visit: Payer: Self-pay | Admitting: Oncology

## 2022-02-09 DIAGNOSIS — C801 Malignant (primary) neoplasm, unspecified: Secondary | ICD-10-CM

## 2022-02-09 DIAGNOSIS — I63319 Cerebral infarction due to thrombosis of unspecified middle cerebral artery: Secondary | ICD-10-CM

## 2022-02-09 DIAGNOSIS — I63511 Cerebral infarction due to unspecified occlusion or stenosis of right middle cerebral artery: Secondary | ICD-10-CM

## 2022-02-09 LAB — GLUCOSE, CAPILLARY: Glucose-Capillary: 85 mg/dL (ref 70–99)

## 2022-02-09 MED ORDER — ACETAMINOPHEN 325 MG PO TABS: 325.0000 mg | ORAL_TABLET | ORAL | Status: DC | PRN

## 2022-02-09 MED ORDER — GLUCERNA SHAKE PO LIQD: 237.0000 mL | Freq: Three times a day (TID) | ORAL | 0 refills | Status: AC

## 2022-02-09 MED ORDER — ASPIRIN 81 MG PO TBEC: 81.0000 mg | DELAYED_RELEASE_TABLET | Freq: Every day | ORAL | 0 refills | Status: AC

## 2022-02-09 MED ORDER — ENOXAPARIN SODIUM 120 MG/0.8ML IJ SOSY: 120.0000 mg | PREFILLED_SYRINGE | INTRAMUSCULAR | 0 refills | Status: DC

## 2022-02-09 MED ORDER — METFORMIN HCL 500 MG PO TABS: 500.0000 mg | ORAL_TABLET | Freq: Two times a day (BID) | ORAL | 0 refills | Status: DC

## 2022-02-09 MED ORDER — TAMSULOSIN HCL 0.4 MG PO CAPS: 0.4000 mg | ORAL_CAPSULE | Freq: Every day | ORAL | 0 refills | Status: AC

## 2022-02-09 MED ORDER — METFORMIN HCL ER (MOD) 500 MG PO TB24: ORAL_TABLET | ORAL | 0 refills | Status: DC

## 2022-02-09 NOTE — Progress Notes (Signed)
Inpatient Rehabilitation Care Coordinator ?Discharge Note  ? ?Patient Details  ?Name: Robert Warner ?MRN: [id number] ?Date of Birth: [birth year] ? ? ?Discharge location: Home ? ?Length of Stay: 13 Days ? ?Discharge activity level: Sup/Cga ? ?Home/community participation: Spouse ? ?Patient response HM:CNOBSJ Literacy - How often do you need to have someone help you when you read instructions, pamphlets, or other written material from your doctor or pharmacy?: Sometimes ? ?Patient response GG:[id number] Isolation - How often do you feel lonely or isolated from those around you?: Never ? ?Services provided included: MD, RD, PT, OT, SLP, RN, CM, Pharmacy, TR, SW ? ?Financial Services:  ?Charity fundraiser Utilized: Private Insurance ?[employer] ? ?Choices offered to/list presented to: Patient previously established ? ?Follow-up services arranged:  ?Home Health ?Home Health Agency: [facility]  ?  ?  ?  ? ?Patient response to transportation need: ?Is the patient able to respond to transportation needs?: Yes ?In the past 12 months, has lack of transportation kept you from medical appointments or from getting medications?: No ?In the past 12 months, has lack of transportation kept you from meetings, work, or from getting things needed for daily living?: No ? ? ? ?Comments (or additional information): ? ?Patient/Family verbalized understanding of follow-up arrangements:  Yes ? ?Individual responsible for coordination of the follow-up plan: carolyne [phone] ? ?Confirmed correct DME delivered: [name] 02/09/2022   ? ?[name] ?

## 2022-02-09 NOTE — Progress Notes (Signed)
? ?                                                       PROGRESS NOTE ? ? ?Subjective/Complaints: ? ?No  c/os ?Discussed d/c process ?DIscussed no driving due to left neglect  ?Pt does not recall Robert Warner administering Lovenox injection  ? ?ROS: Patient denies CP, SOB, N/V/D ? ? ?Objective: ?  ?No results found. ?No results for input(s): WBC, HGB, HCT, PLT in the last 72 hours. ? ? ?No results for input(s): NA, K, CL, CO2, GLUCOSE, BUN, CREATININE, CALCIUM in the last 72 hours. ? ? ? ?Intake/Output Summary (Last 24 hours) at 02/09/2022 0730 ?Last data filed at 02/09/2022 5027 ?Gross per 24 hour  ?Intake 840 ml  ?Output 1050 ml  ?Net -210 ml  ? ?  ? ?  ? ?Physical Exam: ?Vital Signs ?Blood pressure 122/84, pulse 75, temperature 98.1 ?F (36.7 ?C), temperature source Oral, resp. rate 18, height '5\' 10"'$  (1.778 m), weight 83.1 kg, SpO2 100 %. ? ?General: No acute distress ?Mood and affect are appropriate ?Heart: Regular rate and rhythm no rubs murmurs or extra sounds ?Lungs: Clear to auscultation, breathing unlabored, no rales or wheezes ?Abdomen: Positive bowel sounds, soft nontender to palpation, nondistended ?Extremities: No clubbing, cyanosis, or edema ?Skin: ?   General: Skin is warm and dry.  ?   Comments: No breakdown  ?Neurological:  ?   Mental Status: He is alert. Fair insight and awareness ?Motor: LLE: 4/5, LUE 3+/5, Right side 5/5. Sensation is mildly reduced on left.  ?Minimal left neglect present ? ?Assessment/Plan: ?1. 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 ?Care Tool: ? ?Bathing ?   ?Body parts bathed by patient: Right arm, Left arm, Face, Chest, Abdomen, Front perineal area, Right upper leg, Buttocks, Left upper leg, Right lower leg, Left lower leg  ?   ?  ?  ?Bathing assist Assist Level: Contact Guard/Touching assist ?  ?  ?Upper Body Dressing/Undressing ?Upper body dressing    ?What is the patient wearing?: Pull over shirt ?   ?Upper body assist Assist Level: Supervision/Verbal cueing ?   ?Lower Body Dressing/Undressing ?Lower body dressing ? ? ?   ?What is the patient wearing?: Pants, Incontinence brief ? ?  ? ?Lower body assist Assist for lower body dressing: Minimal Assistance - Patient > 75% ?   ? ?Toileting ?Toileting    ?Toileting assist Assist for toileting: Minimal Assistance - Patient > 75% ?  ?  ?Transfers ?Chair/bed transfer ? ?Transfers assist ?   ? ?Chair/bed transfer assist level: Supervision/Verbal cueing ?  ?  ?Locomotion ?Ambulation ? ? ?Ambulation assist ? ?   ? ?Assist level: Contact Guard/Touching assist ?Assistive device: Walker-rolling ?Max distance: 180  ? ?Walk 10 feet activity ? ? ?Assist ?   ? ?Assist level: Supervision/Verbal cueing ?Assistive device: Walker-rolling  ? ?Walk 50 feet activity ? ? ?Assist   ? ?Assist level: Supervision/Verbal cueing ?Assistive device: Walker-rolling  ? ? ?Walk 150 feet activity ? ? ?Assist Walk 150 feet activity did not occur: Safety/medical concerns ? ?Assist level: Contact Guard/Touching assist ?Assistive device: Walker-rolling ?  ? ?Walk 10 feet on  uneven surface  ?activity ? ? ?Assist Walk 10 feet on uneven surfaces activity did not occur: Safety/medical concerns ? ? ?Assist level: Supervision/Verbal cueing ?Assistive device: Walker-rolling  ? ?Wheelchair ? ? ? ? ?Assist Is the patient using a wheelchair?: No ?Type of Wheelchair: Manual ?Wheelchair activity did not occur: N/A ? ?Wheelchair assist level: Maximal Assistance - Patient 25 - 49%, Total Assistance - Patient < 25% ?Max wheelchair distance: 110 ft  ? ? ?Wheelchair 50 feet with 2 turns activity ? ? ? ?Assist ? ?  ?Wheelchair 50 feet with 2 turns activity did not occur: N/A ? ? ?Assist Level: Maximal Assistance - Patient 25 - 49%, Total Assistance - Patient < 25%  ? ?Wheelchair 150 feet activity  ? ? ? ?Assist ? Wheelchair 150 feet activity did not occur:  N/A ? ? ?Assist Level: Total Assistance - Patient < 25%  ? ?Blood pressure 122/84, pulse 75, temperature 98.1 ?F (36.7 ?C), temperature source Oral, resp. rate 18, height '5\' 10"'$  (1.778 m), weight 83.1 kg, SpO2 100 %. ? ?Medical Problem List and Plan: ?1. Functional deficits secondary to embolic shower of right MCA territory- embolic , hypercoag state due to  ?            -patient may shower ?            -ELOS/Goals: 10-14 days S ?          -Continue CIR therapies including PT, OT, SLP ? ?2.  Antithrombotics: ?-DVT/anticoagulation:  Pharmaceutical: Lovenox 120 mg daily- did clarify with Heme -Onc and Neuro pt will be going home on this med  - Warner learning to administer  ?Hx of calf DVT - hypercoag state due to malignancy  ?            -antiplatelet therapy: None ?3. Pain: continue Tylenol, tramadol as needed ? 3/25- pain controlled- took tramadol at home.  ?4. Mood: LCSW to evaluate patient and provide emotional support ?            -antipsychotic agents: N/A ?5. Neuropsych: This patient is capable of making decisions on Robert own behalf. ?6. Skin/Wound Care: Routine skin care checks ?7. Fluids/Electrolytes/Nutrition: Routine I's and O's and follow-up chemistries ?8. Hyperlipidemia: Continue Lipitor 40 mg daily ?            -- Increasing dose not recommended secondary to liver cancer ?9: DM-2: Continue sliding scale insulin, Semglee 16 units daily ?- Start metformin '500mg'$  daily.  ?            -- Carb modified diet ?            --On metformin 750 mg at home ?CBG (last 3)  ?Recent Labs  ?  02/08/22 ?1804 02/08/22 ?2117 02/09/22 ?0612  ?GLUCAP 95 97 85  ? ?3/27 increase metformin to '500mg'$  BID- 3/28 am CBG on low side , reduce Semglee to 14U  ?Controlled 3/30 ? ?10: BPH: Continue Flomax ?11: UTI: continue cefdinir through weekend ?            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            --follow-up with Robert Warner 3/24, has CA related  hypercoagulable state  ? 22: Age indeterminate left peroneal vein DVT; diagnosed on 12/05/2021  initially on Eliquis>>now on Lovenox ?14: Prior CVA with history of L>R intra-atrial shunt ?            --  breakthrough CVA in 11/2021 ?            --continue Lovenox, statin for secondary prevention ?15. Nutrition ? 3/23 intake is good, eating 100% ?16. Hypertension: flowsheet reviewed and has been labile: continue to monitor BP TID ?Vitals:  ? 02/09/22 0406 02/09/22 0617  ?BP: (!) 140/91 122/84  ?Pulse: 93 75  ?Resp: 15 18  ?Temp: 98.9 ?F (37.2 ?C) 98.1 ?F (36.7 ?C)  ?SpO2: 100% 100%  ?Good control 3/30 ? ?17. Overweight BMI 26.29: provide dietary education ?  ? ? ?LOS: ?13 days ?A FACE TO FACE EVALUATION WAS PERFORMED ? ?Robert Warner ?02/09/2022, 7:30 AM  ? ? ? ? ?

## 2022-02-09 NOTE — Progress Notes (Signed)
Patient ID: Robert Warner, male   DOB: October 03, 1941, 81 y.o.   MRN: [id number] ? ?Bedside Commode ordered through [employer] per spouse request ?

## 2022-02-09 NOTE — Progress Notes (Signed)
[facility] lab ordered for next office visit per Dr Benay Spice ?

## 2022-02-09 NOTE — Progress Notes (Signed)
HEMATOLOGY-ONCOLOGY PROGRESS NOTE ? ?ASSESSMENT AND PLAN: ?Poorly differentiated adenocarcinoma involving the liver ?Abdominal ultrasound 08/05/2021-indeterminate 50m solid mass in the right hepatic lobe.   ?MRI of the liver 09/06/2021-multifocal rim-enhancing lesions throughout both lobes of the liver.  Index lesion within the lateral dome of right hepatic lobe measures 1.1 cm, segment 4A lesion measures 1.6 x 1.4 cm, segment 2 lesion measures 0.8 x 0.7 cm, posteromedial margin of the right hepatic lobe subcapsular lesion measures 2.5 x 2.0 cm ?09/16/2021 CT angio neck-9 mm right upper lobe nodule ?Biopsy of a right liver mass on 09/23/2021.  Pathology shows poorly differentiated adenocarcinoma positive for cytokeratin 7, CDX2 and cytokeratin 20 and negative for TTF-1, PSA and prostein.  Differential diagnoses include pancreatobiliary and less likely upper GI. ?HER2 positive by FISH ?PD-L1 combined positive score 0% ?MSS equivocal, tumor mutation burden 0, ERBB2 amplification equivocal ?09/29/2021 CA 19-9 81, CEA 33 ?PET scan 10/10/2021-solitary 10 mm right upper lobe pulmonary nodule with minimal FDG uptake.  7.5 mm left internal mammary lymph node hypermetabolic with SUV max 42.91  Numerous hepatic lesions.  Periportal lymphadenopathy SUV max 8.18.  2 adjacent lesions noted in the mesentery in the right mid abdomen with somewhat irregular margins, SUV 8.09.  No hypermetabolic colonic lesion identified to suggest a primary colon cancer.  No pancreatic lesion.  No gastric lesion.  No retroperitoneal lymphadenopathy. ?Upper endoscopy 10/13/2021-no mass, H. pylori gastritis on gastric biopsy ?Cycle 1 FOLFOX 10/19/2021 ?Cycle 2 FOLFOX 11/02/2021, Emend added, oxaliplatin dose reduced ?Cycle 3 FOLFOX 11/16/2021 ?Cycle 4 FOLFOX 11/30/2021 ?CT abdomen/pelvis with contrast on 12/05/2021-stable exam with bilobar hepatic masses, findings suggestive of metastatic gallbladder cancer. ?Hospital admission 09/16/2021 - 09/19/2021 with  gait disturbance-multifocal embolic stroke on MRI 191/04/6059 echo with interatrial shunt, transcranial Doppler with bubble study indicative of a medium size right to left shunt, lower extremity Doppler studies negative for DVT ?Hospital admission 09/20/2021 - 09/22/2021 with chest pain-CT coronary with no acute findings.   ?Diabetes ?Hypertension ?BPH ?Chronic kidney disease ?Brain CT 11/18/2021-new "lesion" in the left cerebellum when compared to brain MRI 09/16/2021.  Appearance of rapid development suspicious for a subacute infarct.  Metastatic focus not excluded.  Follow-up brain MRI with contrast recommended in the next 4 to 6 weeks. ?[facility], Heparin then apixaban anticoagulation ?10.  Klebsiella bacteremia 12/05/2021-no apparent source for infection ?11.  Thrombocytopenia secondary to chemotherapy and bacteremia ?12.  Left peroneal DVT 12/05/2021 ?13.  Hospital admission 32/12/3341-HWYSHembolic stroke ?MRI brain 01/23/2022-multiple, mostly punctate acute infarcts in the right greater than left cerebral hemisphere and left cerebellum with a larger area of infarction in the posterior right frontal lobe, subacute infarct in the left cerebellar hemisphere ?Lower extremity Dopplers 01/23/2022-chronic left peroneal DVT ? 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  He continues Lovenox anticoagulation. ?We discussed treatment of the metastatic cancer again today.  The primary tumor site has not been clearly defined, but I suspect he has a biliary tract/gallbladder primary.  There was evidence of progressive metastatic disease on the CT earlier this month.  We discussed treatment options including comfort care versus a trial of salvage systemic therapy.  He indicated he wishes to proceed with treatment of the cancer. ?The tumor is HER2 amplified by FISH.  I recommend treatment with gemcitabine/cisplatin every 2 weeks and concurrent  trastuzumab. ? ?We reviewed potential toxicities associated with this regimen including the chance of nausea/vomiting, alopecia, hematologic toxicity, rash, fever, pneumonitis, neuropathy, nephropathy,  and an allergic reaction.  We discussed the cardiac toxicity associated with trastuzumab.  He agrees to proceed. ? ?He will return for an office visit and further discussion next week ?Recommendations: ?1.  Continue daily Lovenox ?2.  Office visit as scheduled at the Cancer center on 02/17/2022 ? ? ? ? ?Julieanne Manson MD ? 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  He is scheduled for discharge to home today.  He is ambulating.  He reports tolerating the Lovenox injections well.  No new neurologic symptoms. ? ?Oncology History  ?Cancer with unknown primary site [facility])  ?10/11/2021 Initial Diagnosis  ? Cancer with unknown primary site [facility]) ?  ?10/19/2021 -  Chemotherapy  ? Patient is on Treatment Plan : COLORECTAL FOLFOX q14d  ?   ? ?PHYSICAL EXAMINATION: ? ?Vitals:  ? 02/09/22 0406 02/09/22 0617  ?BP: (!) 140/91 122/84  ?Pulse: 93 75  ?Resp: 15 18  ?Temp: 98.9 ?F (37.2 ?C) 98.1 ?F (36.7 ?C)  ?SpO2: 100% 100%  ? ?Filed Weights  ? 02/01/22 1106  ?Weight: 183 lb 3.2 oz (83.1 kg)  ? ? ?Intake/Output from previous day: ?03/29 0701 - 03/30 0700 ?In: 840 [P.O.:840] ?Out: 1050 [Urine:1050] ? ?Physical exam: ?HEENT-no thrush ?Abdomen-no hepatosplenomegaly, no mass ?Vascular-no leg edema ?Neurologic-motor exam appears intact in the left arm and leg ? ?Port-A-Cath site without erythema ? ?LABORATORY DATA:  ?I have reviewed the data as listed ? ?  Latest Ref Rng & Units 02/03/2022  ?  5:47 AM 01/28/2022  ?  3:39 AM 01/25/2022  ?  1:22 AM  ?CMP  ?Glucose 70 - 99 mg/dL 108   104   140    ?BUN 8 - 23 mg/dL 20   19   21     ?Creatinine 0.61 - 1.24 mg/dL 1.23   1.14   1.09    ?Sodium 135 - 145 mmol/L 136   137   135    ?Potassium 3.5 - 5.1 mmol/L 4.5   4.1   4.1    ?Chloride 98 - 111 mmol/L 104   102   102    ?CO2 22 - 32 mmol/L 25   27    24     ?Calcium 8.9 - 10.3 mg/dL 9.1   9.0   9.1    ?Total Protein 6.5 - 8.1 g/dL  6.3     ?Total Bilirubin 0.3 - 1.2 mg/dL  0.5     ?Alkaline Phos 38 - 126 U/L  149     ?AST 15 - 41 U/L  59     ?ALT 0 - 44 U/L  49     ? ? ?Lab Results  ?Component Value Date  ? WBC 4.4 02/03/2022  ? HGB 11.2 (L) 02/03/2022  ? HCT 33.3 (L) 02/03/2022  ? MCV 88.8 02/03/2022  ? PLT 202 02/03/2022  ? NEUTROABS 2.2 01/28/2022  ? ? ?Lab Results  ?Component Value Date  ? CEA 38.62 (H) 12/28/2021  ? BTD974 117 (H) 12/28/2021  ? ? ?CT ANGIO HEAD NECK W WO CM ? ?Result Date: 01/24/2022 ?CLINICAL DATA:  Stroke suspected EXAM: CT ANGIOGRAPHY HEAD AND NECK TECHNIQUE: Multidetector CT imaging of the head and neck was performed using the standard protocol during bolus administration of intravenous contrast. Multiplanar CT image reconstructions and MIPs were obtained to evaluate the vascular anatomy. Carotid stenosis measurements (when applicable) are obtained utilizing NASCET criteria, using the distal internal carotid diameter as the denominator. RADIATION DOSE REDUCTION: This exam was performed according to the departmental  dose-optimization program which includes automated exposure control, adjustment of the mA and/or kV according to patient size and/or use of iterative reconstruction technique. CONTRAST:  59m OMNIPAQUE IOHEXOL 350 MG/ML SOLN COMPARISON:  CT head 01/23/2022, CTA 12/04/2021. FINDINGS: CT HEAD FINDINGS Brain: No evidence of acute infarction, hemorrhage, cerebral edema, mass, mass effect, or midline shift. No hydrocephalus or extra-axial fluid collection. Periventricular white matter changes, likely the sequela of chronic small vessel ischemic disease. Remote lacunar infarcts in the right thalamus, bilateral basal ganglia, and bilateral cerebellar hemispheres. Vascular: No hyperdense vessel. Skull: Normal. Negative for fracture or focal lesion. Sinuses/Orbits: No acute finding. Other: The mastoid air cells are well aerated. CTA  NECK FINDINGS Aortic arch: Two-vessel arch with a common origin of the brachiocephalic and left common carotid arteries. Imaged portion shows no evidence of aneurysm or dissection. No significant stenosis of the maj

## 2022-02-09 NOTE — Progress Notes (Signed)
DISCONTINUE OFF PATHWAY REGIMEN - Other ? ? ?OFF01020:mFOLFOX6 (Leucovorin IV D1 + Fluorouracil IV D1/CIV D1,2 + Oxaliplatin IV D1) q14 Days: ?  A cycle is every 14 days: ?    Oxaliplatin  ?    Leucovorin  ?    Fluorouracil  ?    Fluorouracil  ? ?**Always confirm dose/schedule in your pharmacy ordering system** ? ?REASON: Disease Progression ?PRIOR TREATMENT: mFOLFOX6 (Leucovorin IV D1 + Fluorouracil IV D1/CIV D1,2 + Oxaliplatin IV D1) q14 Days ?TREATMENT RESPONSE: Stable Disease (SD) ? ?START OFF PATHWAY REGIMEN - Other ? ? ?OFF02388:Gemcitabine + Cisplatin (split-dose cisplatin) q21 days: ?  A cycle is every 21 days: ?    Gemcitabine  ?    Cisplatin  ? ?**Always confirm dose/schedule in your pharmacy ordering system** ? ?Patient Characteristics: ?Intent of Therapy: ?Non-Curative / Palliative Intent, Discussed with Patient ?

## 2022-02-09 NOTE — Progress Notes (Signed)
Patient discharged home with wife, all belongings and equipment sent home. ?

## 2022-02-13 ENCOUNTER — Telehealth: Payer: Self-pay | Admitting: Family Medicine

## 2022-02-13 NOTE — Telephone Encounter (Signed)
Laurel Run with [facility] is calling and needs verbal order for speech therapy 1x8 due to pt had another stroke ?

## 2022-02-14 ENCOUNTER — Telehealth: Payer: Self-pay | Admitting: Family Medicine

## 2022-02-14 NOTE — Telephone Encounter (Signed)
[name] OT with [facility] is calling and needs verbal orders for OT 2X8 ?

## 2022-02-14 NOTE — Telephone Encounter (Signed)
Robert Warner has been given verbal orders ?

## 2022-02-14 NOTE — Telephone Encounter (Signed)
[name] given verbal orders ?

## 2022-02-16 ENCOUNTER — Telehealth: Payer: Self-pay | Admitting: Pharmacist

## 2022-02-16 NOTE — Chronic Care Management (AMB) (Signed)
? ? ?Chronic Care Management ?Pharmacy Assistant  ? ?Name: Robert Warner  MRN: [id number] DOB: 05/01/1941 ? ?Reason for Encounter: [place] Follow Up Call ?  ?Conditions to be addressed/monitored: ?DMII ? ?Recent office visits:  ?01/16/22 [name], MD - Patient presented for Hearing loss die to cerumen impaction left and other concerns. Stopped Insulin Lispro  ? ?Recent consult visits:  ?01/12/22 [name], MD (Oncology) - Patient presented for Cancer with unknown primary site. No medication changes. ? ?Hospital visits:  ?Medication Reconciliation was completed by comparing discharge summary, patient?s EMR and Pharmacy list, and upon discussion with patient. ? ?Patient presented to [facility] on  01/27/22 due to CVA. Patient was present for 13 days   ? ?New?Medications Started at [facility] Discharge:?? ?-started  ?acetaminophen (TYLENOL) ?aspirin ?feeding supplement ([place]) ?metFORMIN (GLUCOPHAGE) ?tamsulosin (FLOMAX ? ?Medication Changes at Hospital Discharge: ?-Changed  ?none ? ?Medications Discontinued at Hospital Discharge: ?-Stopped ?acetaminophen 650 MG CR tablet (TYLENOL) ?cefdinir 300 MG capsule (OMNICEF) ?insulin aspart 100 UNIT/ML injection (novoLOG) ?metFORMIN 750 MG 24 hr tablet (GLUCOPHAGE-XR) ?silodosin 8 MG Caps capsule (RAPAFLO) ?traMADol 50 MG tablet (ULTRAM) ? ?Medications that remain the same after Hospital Discharge:??  ?-All other medications will remain the same.   ? ?Medication Reconciliation was completed by comparing discharge summary, patient?s EMR and Pharmacy list, and upon discussion with patient. ? ? ? ?Patient presented to [facility] on  01/23/22 due to Acute embolic stroke. Patient was present for 4 days   ? ?New?Medications Started at [facility] Discharge:?? ?-started  ?cefdinir (OMNICEF) ?enoxaparin (LOVENOX ? ?Medication Changes at Hospital Discharge: ?-Changed  ?Toujeo Max SoloStar ? ?Medications Discontinued at  Hospital Discharge: ?-Stopped ?apixaban 5 MG Tabs tablet ? ?Medications that remain the same after Hospital Discharge:??  ?-All other medications will remain the same.   ? ? ?Medications: ?Outpatient Encounter Medications as of 02/16/2022  ?Medication Sig  ? Accu-Chek Softclix Lancets lancets Use to test blood sugar levels once a day.  ? acetaminophen (TYLENOL) 325 MG tablet Take 1-2 tablets (325-650 mg total) by mouth every 4 (four) hours as needed for mild pain.  ? aspirin EC 81 MG EC tablet Take 1 tablet (81 mg total) by mouth daily. Swallow whole.  ? atorvastatin (LIPITOR) 40 MG tablet Take 1 tablet (40 mg total) by mouth daily.  ? Blood Glucose Monitoring Suppl (ACCU-CHEK AVIVA PLUS) w/Device KIT Use to check blood sugar once a day  ? Cholecalciferol (VITAMIN D3) 2000 UNITS TABS Take 2,000 Units by mouth daily.  ? Continuous Blood Gluc Receiver (DEXCOM G6 RECEIVER) DEVI Use to check blood sugar levels as directed.  ? Continuous Blood Gluc Sensor (DEXCOM G6 SENSOR) MISC Use to check blood glucose levels as directed.  ? enoxaparin (LOVENOX) 120 MG/0.8ML injection Inject 0.8 mLs (120 mg total) into the skin daily.  ? feeding supplement, GLUCERNA SHAKE, (GLUCERNA SHAKE) LIQD Take 237 mLs by mouth 3 (three) times daily between meals.  ? glucose blood test strip Use as instructed  ? insulin glargine, 2 Unit Dial, (TOUJEO MAX SOLOSTAR) 300 UNIT/ML Solostar Pen Inject 16 Units into the skin daily.  ? lidocaine-prilocaine (EMLA) cream Apply 1 application topically as needed. Apply 1/2 tablespoon to port site 2 hours prior to stick and cover with Press-and-Seal to numb site for access (Patient taking differently: Apply 1 application. topically daily as needed. Apply 1/2 tablespoon to port site 2 hours prior to stick and cover with Press-and-Seal to numb site  for access)  ? metFORMIN (GLUCOPHAGE) 500 MG tablet Take 1 tablet (500 mg total) by mouth 2 (two) times daily with a meal.  ? ondansetron (ZOFRAN) 8 MG tablet Take 1  tablet (8 mg total) by mouth every 8 (eight) hours as needed for nausea or vomiting. Start 72 hours after each IV chemotherapy administration  ? pantoprazole (PROTONIX) 20 MG tablet TAKE 1 TABLET(20 MG) BY MOUTH DAILY (Patient taking differently: Take 20 mg by mouth daily.)  ? polyethylene glycol (MIRALAX / GLYCOLAX) 17 g packet Take 34 g by mouth daily as needed. (Patient taking differently: Take 17 g by mouth daily as needed for mild constipation.)  ? prochlorperazine (COMPAZINE) 10 MG tablet Take 1 tablet (10 mg total) by mouth every 6 (six) hours as needed for nausea.  ? senna-docusate (SENOKOT-S) 8.6-50 MG tablet Take 2 tablets by mouth 2 (two) times daily. Hold for diarrhea (Patient not taking: Reported on 01/23/2022)  ? tamsulosin (FLOMAX) 0.4 MG CAPS capsule Take 1 capsule (0.4 mg total) by mouth daily after supper.  ? TRAVATAN Z 0.004 % SOLN ophthalmic solution Place 1 drop into both eyes at bedtime.  ? ?No facility-administered encounter medications on file as of 02/16/2022.  ?Recent Relevant Labs: ?Lab Results  ?Component Value Date/Time  ? HGBA1C 8.0 (H) 01/24/2022 06:05 AM  ? HGBA1C 11.9 (H) 12/04/2021 10:57 PM  ? HGBA1C 7.4 07/04/2017 12:00 AM  ? MICROALBUR <0.7 01/31/2021 01:41 PM  ? MICROALBUR <0.7 07/07/2019 09:09 AM  ?  ?Kidney Function ?Lab Results  ?Component Value Date/Time  ? CREATININE 1.23 02/03/2022 05:47 AM  ? CREATININE 1.14 01/28/2022 03:39 AM  ? CREATININE 1.04 12/28/2021 08:57 AM  ? CREATININE 0.95 12/14/2021 10:10 AM  ? GFR 54.73 (L) 09/13/2021 07:19 AM  ? GFRNONAA 59 (L) 02/03/2022 05:47 AM  ? GFRNONAA >60 12/28/2021 08:57 AM  ? GFRAA 95 06/15/2008 12:00 AM  ? ? ?Current antihyperglycemic regimen:  ?Metformin ?Tujeo ?Have there been any recent hospitalizations or ED visits since last visit with CPP? Yes ?Patient denies hypoglycemic symptoms, including None ?Patient denies hyperglycemic symptoms, including none ?How often are you checking your blood sugar? 3-4 times daily ?What are your  blood sugars ranging?  ?Wife reports readings of 86,92 145 being the highest ?During the week, how often does your blood glucose drop below 70?  rarely ? ?Adherence Review: ?Is the patient currently on a STATIN medication? Yes ?Is the patient currently on ACE/ARB medication? No ?Does the patient have >5 day gap between last estimated fill dates? No ? ?Notes: ?Wife reports patient is needing her assistance with all ADL's currently. She reports he is taking his Flomax at night before bed and as a result is walking every hour on the hour, she is also waking to assist him in using the restroom. She reports she would like guidance on changing his dosing to lunch to see if that helps and if not after monitoring for a while change to breakfast. She further reports that patient often complaining of a stomach ache during meal times. She states prior to the hospital he was using 2 Protonix during the day and after discharge was decreased to 1 she is curious if adding the other back would be more beneficial in his complaint of pain. ? ?Spoke to [name] she reports that he can take the Flomax with a meal preferably lunch time with no issue and that he may per notes on prescription use Protonix twice daily if needed. ? ?Call back  to patient's wife and advised of the above,  she reports they have Atleast 30 Protonix and will increase to twice daily and monitor and if its helping ask prescriber for a renewal on prescription and will also mention this during his oncology appointment on tomorrow. ?She confirmed receipt of the Dexcom machine as of last week and states they have 8 days left on the Freestyle and will call if she has an issues setting up the machine. ? ? ?Care Gaps: ?Zoster Vaccine - Overdue ?TDAP - Overdue ?Foot Exam - Overdue ?Urine Micro - Overdue ?BP- 122/84 ( 01/27/22 hosp) ?AWV- 9/22 ?CCM- 5/23 ?Lab Results  ?Component Value Date  ? HGBA1C 8.0 (H) 01/24/2022  ? ? ?Star Rating Drugs: ?Atorvastatin (Lipitor)  40 mg - Last filled 01/23/22 90 DS at [facility] ?Metformin 750 mg - Last filled 02/09/22 90 DS at [facility] ? ? ?Ned Clines CMA ?Clinical Pharmacist Assistant ?[phone] ? ?

## 2022-02-17 ENCOUNTER — Inpatient Hospital Stay (HOSPITAL_BASED_OUTPATIENT_CLINIC_OR_DEPARTMENT_OTHER): Payer: Medicare HMO | Admitting: Oncology

## 2022-02-17 ENCOUNTER — Inpatient Hospital Stay: Payer: Medicare HMO | Attending: Oncology

## 2022-02-17 VITALS — BP 120/91 | HR 86 | Temp 97.9°F | Resp 18 | Wt 177.2 lb

## 2022-02-17 DIAGNOSIS — N189 Chronic kidney disease, unspecified: Secondary | ICD-10-CM | POA: Diagnosis not present

## 2022-02-17 DIAGNOSIS — N400 Enlarged prostate without lower urinary tract symptoms: Secondary | ICD-10-CM | POA: Diagnosis not present

## 2022-02-17 DIAGNOSIS — R978 Other abnormal tumor markers: Secondary | ICD-10-CM | POA: Diagnosis not present

## 2022-02-17 DIAGNOSIS — Z5112 Encounter for antineoplastic immunotherapy: Secondary | ICD-10-CM | POA: Insufficient documentation

## 2022-02-17 DIAGNOSIS — Z5111 Encounter for antineoplastic chemotherapy: Secondary | ICD-10-CM | POA: Insufficient documentation

## 2022-02-17 DIAGNOSIS — E1122 Type 2 diabetes mellitus with diabetic chronic kidney disease: Secondary | ICD-10-CM | POA: Insufficient documentation

## 2022-02-17 DIAGNOSIS — I639 Cerebral infarction, unspecified: Secondary | ICD-10-CM | POA: Diagnosis not present

## 2022-02-17 DIAGNOSIS — Z7901 Long term (current) use of anticoagulants: Secondary | ICD-10-CM | POA: Insufficient documentation

## 2022-02-17 DIAGNOSIS — Z86718 Personal history of other venous thrombosis and embolism: Secondary | ICD-10-CM | POA: Diagnosis not present

## 2022-02-17 DIAGNOSIS — C801 Malignant (primary) neoplasm, unspecified: Secondary | ICD-10-CM | POA: Diagnosis not present

## 2022-02-17 DIAGNOSIS — R911 Solitary pulmonary nodule: Secondary | ICD-10-CM | POA: Insufficient documentation

## 2022-02-17 DIAGNOSIS — R590 Localized enlarged lymph nodes: Secondary | ICD-10-CM | POA: Insufficient documentation

## 2022-02-17 DIAGNOSIS — D6959 Other secondary thrombocytopenia: Secondary | ICD-10-CM | POA: Insufficient documentation

## 2022-02-17 DIAGNOSIS — C787 Secondary malignant neoplasm of liver and intrahepatic bile duct: Secondary | ICD-10-CM | POA: Insufficient documentation

## 2022-02-17 DIAGNOSIS — R350 Frequency of micturition: Secondary | ICD-10-CM | POA: Diagnosis not present

## 2022-02-17 DIAGNOSIS — R970 Elevated carcinoembryonic antigen [CEA]: Secondary | ICD-10-CM | POA: Diagnosis not present

## 2022-02-17 DIAGNOSIS — I129 Hypertensive chronic kidney disease with stage 1 through stage 4 chronic kidney disease, or unspecified chronic kidney disease: Secondary | ICD-10-CM | POA: Insufficient documentation

## 2022-02-17 LAB — CBC WITH DIFFERENTIAL (CANCER CENTER ONLY)
Abs Immature Granulocytes: 0.01 10*3/uL (ref 0.00–0.07)
Basophils Absolute: 0 10*3/uL (ref 0.0–0.1)
Basophils Relative: 0 %
Eosinophils Absolute: 0.1 10*3/uL (ref 0.0–0.5)
Eosinophils Relative: 2 %
HCT: 37.1 % — ABNORMAL LOW (ref 39.0–52.0)
Hemoglobin: 11.8 g/dL — ABNORMAL LOW (ref 13.0–17.0)
Immature Granulocytes: 0 %
Lymphocytes Relative: 30 %
Lymphs Abs: 1.4 10*3/uL (ref 0.7–4.0)
MCH: 28.3 pg (ref 26.0–34.0)
MCHC: 31.8 g/dL (ref 30.0–36.0)
MCV: 89 fL (ref 80.0–100.0)
Monocytes Absolute: 0.5 10*3/uL (ref 0.1–1.0)
Monocytes Relative: 10 %
Neutro Abs: 2.7 10*3/uL (ref 1.7–7.7)
Neutrophils Relative %: 58 %
Platelet Count: 219 10*3/uL (ref 150–400)
RBC: 4.17 MIL/uL — ABNORMAL LOW (ref 4.22–5.81)
RDW: 13.2 % (ref 11.5–15.5)
WBC Count: 4.7 10*3/uL (ref 4.0–10.5)
nRBC: 0 % (ref 0.0–0.2)

## 2022-02-17 LAB — CMP (CANCER CENTER ONLY)
ALT: 31 U/L (ref 0–44)
AST: 36 U/L (ref 15–41)
Albumin: 3.7 g/dL (ref 3.5–5.0)
Alkaline Phosphatase: 179 U/L — ABNORMAL HIGH (ref 38–126)
Anion gap: 8 (ref 5–15)
BUN: 23 mg/dL (ref 8–23)
CO2: 28 mmol/L (ref 22–32)
Calcium: 10 mg/dL (ref 8.9–10.3)
Chloride: 102 mmol/L (ref 98–111)
Creatinine: 1.15 mg/dL (ref 0.61–1.24)
GFR, Estimated: >60 mL/min (ref >60)
Glucose, Bld: 140 mg/dL — ABNORMAL HIGH (ref 70–99)
Potassium: 4.3 mmol/L (ref 3.5–5.1)
Sodium: 138 mmol/L (ref 135–145)
Total Bilirubin: 0.8 mg/dL (ref 0.3–1.2)
Total Protein: 7.9 g/dL (ref 6.5–8.1)

## 2022-02-17 LAB — CEA (ACCESS): CEA (CHCC): 163.05 ng/mL — ABNORMAL HIGH (ref 0.00–5.00)

## 2022-02-17 NOTE — Progress Notes (Signed)
?[place] ?OFFICE PROGRESS NOTE ? ? 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 ?INTERVAL HISTORY:  ? 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  A brain MRI revealed new acute infarcts.  He completed a rehabilitation stay and was discharged to home 02/09/2022.  Anticoagulation was changed to Lovenox.  He experienced improvement in his neurologic condition prior to discharge from the hospital. 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  He is ambulatory in the home.  He has frequent urination at night.  He is sleeping during the day.  He has a good appetite.  No new neurologic symptoms. ? ?Objective: ? ?Vital signs in last 24 hours: ? ?Blood pressure (!) 120/91, pulse 86, temperature 97.9 ?F (36.6 ?C), temperature source Tympanic, resp. rate 18, weight 177 lb 3.2 oz (80.4 kg), SpO2 100 %. ?  ? ?Resp: Lungs clear bilaterally ?Cardio: Regular rate and rhythm ?GI: Tender in the right upper abdomen, no hepatosplenomegaly, no mass ?Vascular: No leg edema ?Neuro: Alert and oriented, the motor exam is intact in the arms and legs bilaterally, he ambulates with a walker ? ?Portacath/PICC-without erythema ? ?Lab Results: ? ?Lab Results  ?Component Value Date  ? WBC 4.7 02/17/2022  ? HGB 11.8 (L) 02/17/2022  ? HCT 37.1 (L) 02/17/2022  ? MCV 89.0 02/17/2022  ? PLT 219 02/17/2022  ? NEUTROABS 2.7 02/17/2022  ? ? ?CMP  ?Lab Results  ?Component Value Date  ? NA 136 02/03/2022  ? K 4.5 02/03/2022  ? CL 104 02/03/2022  ? CO2 25 02/03/2022  ? GLUCOSE 108 (H) 02/03/2022  ? BUN 20 02/03/2022  ? CREATININE 1.23 02/03/2022  ? CALCIUM 9.1 02/03/2022  ? PROT 6.3 (L) 01/28/2022  ? ALBUMIN 2.7 (L) 01/28/2022  ? AST 59 (H) 01/28/2022  ? ALT 49 (H) 01/28/2022  ? ALKPHOS 149 (H) 01/28/2022  ? BILITOT 0.5 01/28/2022  ? GFRNONAA 59 (L) 02/03/2022  ? GFRAA 95 06/15/2008  ? ? ?Lab Results  ?Component Value Date  ? CEA 38.62 (H) 12/28/2021  ? PTW656 117 (H) 12/28/2021  ? ? ? ?Medications: I  have reviewed the patient's current medications. ? ? ?Assessment/Plan: ?Poorly differentiated adenocarcinoma involving the liver ?Abdominal ultrasound 08/05/2021-indeterminate 29m solid mass in the right hepatic lobe.   ?MRI of the liver 09/06/2021-multifocal rim-enhancing lesions throughout both lobes of the liver.  Index lesion within the lateral dome of right hepatic lobe measures 1.1 cm, segment 4A lesion measures 1.6 x 1.4 cm, segment 2 lesion measures 0.8 x 0.7 cm, posteromedial margin of the right hepatic lobe subcapsular lesion measures 2.5 x 2.0 cm ?09/16/2021 CT angio neck-9 mm right upper lobe nodule ?Biopsy of a right liver mass on 09/23/2021.  Pathology shows poorly differentiated adenocarcinoma positive for cytokeratin 7, CDX2 and cytokeratin 20 and negative for TTF-1, PSA and prostein.  Differential diagnoses include pancreatobiliary and less likely upper GI. ?HER2 positive by FISH ?PD-L1 combined positive score 0% ?MSS equivocal, tumor mutation burden 0, ERBB2 amplification equivocal ?09/29/2021 CA 19-9 81, CEA 33 ?PET scan 10/10/2021-solitary 10 mm right upper lobe pulmonary nodule with minimal FDG uptake.  7.5 mm left internal mammary lymph node hypermetabolic with SUV max 48.12  Numerous hepatic lesions.  Periportal lymphadenopathy SUV max 8.18.  2 adjacent lesions noted in the mesentery in the right mid abdomen with somewhat irregular margins, SUV 8.09.  No hypermetabolic colonic lesion identified to suggest a primary colon cancer.  No pancreatic lesion.  No gastric lesion.  No  retroperitoneal lymphadenopathy. ?Upper endoscopy 10/13/2021-no mass, H. pylori gastritis on gastric biopsy ?Cycle 1 FOLFOX 10/19/2021 ?Cycle 2 FOLFOX 11/02/2021, Emend added, oxaliplatin dose reduced ?Cycle 3 FOLFOX 11/16/2021 ?Cycle 4 FOLFOX 11/30/2021 ?CT abdomen/pelvis with contrast on 12/05/2021-stable exam with bilobar hepatic masses, findings suggestive of metastatic gallbladder cancer. ?CT 01/25/2022-new and enlarging  hepatic metastases, abnormal masslike appearance at the gallbladder and porta hepatis, stable left internal mammary node, increased left supraclavicular node, increased size of right upper lobe nodule ?Hospital admission 09/16/2021 - 09/19/2021 with gait disturbance-multifocal embolic stroke on MRI 56/01/8755, echo with interatrial shunt, transcranial Doppler with bubble study indicative of a medium size right to left shunt, lower extremity Doppler studies negative for DVT ?Hospital admission 09/20/2021 - 09/22/2021 with chest pain-CT coronary with no acute findings.   ?Diabetes ?Hypertension ?BPH ?Chronic kidney disease ?Brain CT 11/18/2021-new "lesion" in the left cerebellum when compared to brain MRI 09/16/2021.  Appearance of rapid development suspicious for a subacute infarct.  Metastatic focus not excluded.  Follow-up brain MRI with contrast recommended in the next 4 to 6 weeks. ?[facility] admission 4/33/2951-OACZYSAY embolic CVAs, Heparin then apixaban anticoagulation ?10.  Klebsiella bacteremia 12/05/2021-no apparent source for infection ?11.  Thrombocytopenia secondary to chemotherapy and bacteremia ?12.  Left peroneal DVT 12/05/2021 ?13.  Hospital admission 01/11/6009-XNATF embolic stroke ?MRI brain 01/23/2022-multiple, mostly punctate acute infarcts in the right greater than left cerebral hemisphere and left cerebellum with a larger area of infarction in the posterior right frontal lobe, subacute infarct in the left cerebellar hemisphere ?Lower extremity Dopplers 01/23/2022-chronic left peroneal DVT ?Anticoagulation changed to Lovenox ?  ? ? 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  There is clinical and radiologic evidence of disease progression.  He completed 4 cycles of FOLFOX and has been maintained off of systemic therapy for the past few months.  The HER2 returned positive by FISH. ? ?He has been admitted with recurrent strokes secondary to a hypercoagulation  syndrome with an interatrial shunt.  He is now maintained on Lovenox anticoagulation. ? ?He understands no therapy will be curative.  We discussed comfort/supportive care versus a trial of salvage systemic therapy.  He indicated while in the hospital again today's wish to proceed with salvage systemic therapy.  His wife was present for today's visit.  I recommend gemcitabine/cisplatin and trastuzumab.  We reviewed potential toxicities associated with this regimen including the chance of nausea/vomiting, alopecia, and hematologic toxicity.  We discussed the fever, rash, pneumonitis associated with gemcitabine.  We reviewed the neuropathy and nephrotoxicity seen with cisplatin.  We discussed the diarrhea and cardiac toxicity associated with trastuzumab.  He agrees to proceed. ? ?The plan is to begin treatment on 02/22/2022.  He will return for an office visit and cycle 2 03/09/2022. ? ?He will continue home physical therapy.  The plan is to discontinue chemotherapy and pursue hospice care if his performance status declines. ?[name], MD ? ?02/17/2022  ?8:35 AM ? ? ?

## 2022-02-18 ENCOUNTER — Other Ambulatory Visit: Payer: Self-pay | Admitting: Oncology

## 2022-02-18 LAB — CANCER ANTIGEN 19-9: CA 19-9: 340 U/mL — ABNORMAL HIGH (ref 0–35)

## 2022-02-20 ENCOUNTER — Encounter: Payer: Self-pay | Admitting: *Deleted

## 2022-02-20 ENCOUNTER — Other Ambulatory Visit: Payer: Self-pay | Admitting: Oncology

## 2022-02-20 ENCOUNTER — Encounter: Payer: Self-pay | Admitting: Licensed Clinical Social Worker

## 2022-02-20 MED ORDER — OXYCODONE-ACETAMINOPHEN 5-325 MG PO TABS: 1.0000 | ORAL_TABLET | ORAL | 0 refills | Status: DC | PRN

## 2022-02-20 NOTE — Progress Notes (Signed)
PATIENT NAVIGATOR PROGRESS NOTE ? ?Name: Robert Warner ?Date: 02/20/2022 ?MRN: [id number]  ?DOB: 1941/03/13 ? ? ?Reason for visit:  ?Telephone call ? ?Comments:  Mrs Stirewalt called and stated that the Tramadol has not been effective in managing his pain. ?Reviewed bowel movements with her and she states that he is having regular bowel movements per pt report. ?Reviewed importance of bowel movements with pain medicine. ?She states that he is not experiencing any nausea but due to pain is limiting what he is eating.  ? ?Dr Benay Spice stated that he is going to prescribe Oxycodone for pain management to be escribed to [facility] on [address] ? ? ? ? ? ?Time spent counseling/coordinating care: 30-45 minutes ? ?

## 2022-02-20 NOTE — Progress Notes (Signed)
[facility] CSW Progress Note ? ?Clinical Social Worker contacted caregiver by phone to follow-up from last conversation.  CSW left voicemail with contact information and request for return call. ? ? ? ?Ethelle Ola , LCSW ?

## 2022-02-20 NOTE — Progress Notes (Signed)
PATIENT NAVIGATOR PROGRESS NOTE ? ?Name: Robert Warner ?Date: 02/20/2022 ?MRN: [id number]  ?DOB: 12-May-1941 ? ? ?Reason for visit:  ?Telephone call from Mrs Wogan ? 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  She increased his Protonix to '20mg'$  twice a day at the advice of PCP.  Began giving him Tylenol with little effect, today she is giving him Tramadol. He ate breakfast and then proceeded to give first dose of Tramadol. ? ?Today she will give Tramadol every 8 hours to monitor for effect and call tomorrow if no improvement. ? ?We discussed starting treatment on Wednesday and that he is preparing himself for it. We discussed the feelings around starting treatment. Active listening and supportive presence during conversation. ? ?She will call tomorrow and update ? ? ? ?Time spent counseling/coordinating care: 30-45 minutes ? ?

## 2022-02-21 ENCOUNTER — Telehealth: Payer: Self-pay

## 2022-02-21 NOTE — Telephone Encounter (Signed)
Per [name], ok to use CBC and CMP from 02/17/22 for treatment on 02/22/22. ?

## 2022-02-22 ENCOUNTER — Inpatient Hospital Stay: Payer: Medicare HMO

## 2022-02-22 VITALS — BP 116/85 | HR 98 | Temp 97.8°F | Resp 18 | Ht 70.0 in | Wt 179.0 lb

## 2022-02-22 DIAGNOSIS — C801 Malignant (primary) neoplasm, unspecified: Secondary | ICD-10-CM

## 2022-02-22 DIAGNOSIS — C787 Secondary malignant neoplasm of liver and intrahepatic bile duct: Secondary | ICD-10-CM | POA: Diagnosis not present

## 2022-02-22 DIAGNOSIS — R911 Solitary pulmonary nodule: Secondary | ICD-10-CM | POA: Diagnosis not present

## 2022-02-22 DIAGNOSIS — E1122 Type 2 diabetes mellitus with diabetic chronic kidney disease: Secondary | ICD-10-CM | POA: Diagnosis not present

## 2022-02-22 DIAGNOSIS — Z5111 Encounter for antineoplastic chemotherapy: Secondary | ICD-10-CM | POA: Diagnosis not present

## 2022-02-22 DIAGNOSIS — I129 Hypertensive chronic kidney disease with stage 1 through stage 4 chronic kidney disease, or unspecified chronic kidney disease: Secondary | ICD-10-CM | POA: Diagnosis not present

## 2022-02-22 DIAGNOSIS — Z5112 Encounter for antineoplastic immunotherapy: Secondary | ICD-10-CM | POA: Diagnosis not present

## 2022-02-22 DIAGNOSIS — I639 Cerebral infarction, unspecified: Secondary | ICD-10-CM | POA: Diagnosis not present

## 2022-02-22 DIAGNOSIS — R350 Frequency of micturition: Secondary | ICD-10-CM | POA: Diagnosis not present

## 2022-02-22 LAB — CBC WITH DIFFERENTIAL (CANCER CENTER ONLY)
Abs Immature Granulocytes: 0.01 10*3/uL (ref 0.00–0.07)
Basophils Absolute: 0 10*3/uL (ref 0.0–0.1)
Basophils Relative: 0 %
Eosinophils Absolute: 0.2 10*3/uL (ref 0.0–0.5)
Eosinophils Relative: 4 %
HCT: 35.5 % — ABNORMAL LOW (ref 39.0–52.0)
Hemoglobin: 11.4 g/dL — ABNORMAL LOW (ref 13.0–17.0)
Immature Granulocytes: 0 %
Lymphocytes Relative: 30 %
Lymphs Abs: 1.4 10*3/uL (ref 0.7–4.0)
MCH: 28.5 pg (ref 26.0–34.0)
MCHC: 32.1 g/dL (ref 30.0–36.0)
MCV: 88.8 fL (ref 80.0–100.0)
Monocytes Absolute: 0.5 10*3/uL (ref 0.1–1.0)
Monocytes Relative: 10 %
Neutro Abs: 2.5 10*3/uL (ref 1.7–7.7)
Neutrophils Relative %: 56 %
Platelet Count: 213 10*3/uL (ref 150–400)
RBC: 4 MIL/uL — ABNORMAL LOW (ref 4.22–5.81)
RDW: 12.9 % (ref 11.5–15.5)
WBC Count: 4.5 10*3/uL (ref 4.0–10.5)
nRBC: 0 % (ref 0.0–0.2)

## 2022-02-22 LAB — CMP (CANCER CENTER ONLY)
ALT: 31 U/L (ref 0–44)
AST: 38 U/L (ref 15–41)
Albumin: 3.6 g/dL (ref 3.5–5.0)
Alkaline Phosphatase: 166 U/L — ABNORMAL HIGH (ref 38–126)
Anion gap: 10 (ref 5–15)
BUN: 25 mg/dL — ABNORMAL HIGH (ref 8–23)
CO2: 26 mmol/L (ref 22–32)
Calcium: 9.8 mg/dL (ref 8.9–10.3)
Chloride: 99 mmol/L (ref 98–111)
Creatinine: 1.2 mg/dL (ref 0.61–1.24)
GFR, Estimated: >60 mL/min (ref >60)
Glucose, Bld: 155 mg/dL — ABNORMAL HIGH (ref 70–99)
Potassium: 4.1 mmol/L (ref 3.5–5.1)
Sodium: 135 mmol/L (ref 135–145)
Total Bilirubin: 0.7 mg/dL (ref 0.3–1.2)
Total Protein: 7.7 g/dL (ref 6.5–8.1)

## 2022-02-22 LAB — CEA (ACCESS): CEA (CHCC): 196.75 ng/mL — ABNORMAL HIGH (ref 0.00–5.00)

## 2022-02-22 LAB — MAGNESIUM: Magnesium: 1.6 mg/dL — ABNORMAL LOW (ref 1.7–2.4)

## 2022-02-22 MED ORDER — HEPARIN SOD (PORK) LOCK FLUSH 100 UNIT/ML IV SOLN
500.0000 [IU] | Freq: Once | INTRAVENOUS | Status: AC | PRN
  Administered 2022-02-22: 500 [IU]

## 2022-02-22 MED ORDER — SODIUM CHLORIDE 0.9 % IV SOLN
800.0000 mg/m2 | Freq: Once | INTRAVENOUS | Status: AC
  Administered 2022-02-22: 1634 mg via INTRAVENOUS
  Filled 2022-02-22: qty 26.3

## 2022-02-22 MED ORDER — TRASTUZUMAB-ANNS CHEMO 150 MG IV SOLR
300.0000 mg | Freq: Once | INTRAVENOUS | Status: AC
  Administered 2022-02-22: 300 mg via INTRAVENOUS
  Filled 2022-02-22: qty 14.29

## 2022-02-22 MED ORDER — ACETAMINOPHEN 325 MG PO TABS
650.0000 mg | ORAL_TABLET | Freq: Once | ORAL | Status: AC
  Administered 2022-02-22: 650 mg via ORAL
  Filled 2022-02-22: qty 2

## 2022-02-22 MED ORDER — SODIUM CHLORIDE 0.9 % IV SOLN: Freq: Once | INTRAVENOUS | Status: AC

## 2022-02-22 MED ORDER — SODIUM CHLORIDE 0.9 % IV SOLN
150.0000 mg | Freq: Once | INTRAVENOUS | Status: AC
  Administered 2022-02-22: 150 mg via INTRAVENOUS
  Filled 2022-02-22: qty 5

## 2022-02-22 MED ORDER — DIPHENHYDRAMINE HCL 25 MG PO CAPS
25.0000 mg | ORAL_CAPSULE | Freq: Once | ORAL | Status: AC
  Administered 2022-02-22: 25 mg via ORAL
  Filled 2022-02-22: qty 1

## 2022-02-22 MED ORDER — SODIUM CHLORIDE 0.9% FLUSH
10.0000 mL | INTRAVENOUS | Status: DC | PRN
  Administered 2022-02-22: 10 mL

## 2022-02-22 MED ORDER — SODIUM CHLORIDE 0.9 % IV SOLN
20.0000 mg/m2 | Freq: Once | INTRAVENOUS | Status: AC
  Administered 2022-02-22: 41 mg via INTRAVENOUS
  Filled 2022-02-22: qty 41

## 2022-02-22 MED ORDER — PALONOSETRON HCL INJECTION 0.25 MG/5ML
0.2500 mg | Freq: Once | INTRAVENOUS | Status: AC
  Administered 2022-02-22: 0.25 mg via INTRAVENOUS
  Filled 2022-02-22: qty 5

## 2022-02-22 MED ORDER — POTASSIUM CHLORIDE IN NACL 20-0.9 MEQ/L-% IV SOLN
Freq: Once | INTRAVENOUS | Status: AC
  Filled 2022-02-22: qty 1000

## 2022-02-22 MED ORDER — MAGNESIUM SULFATE 2 GM/50ML IV SOLN
2.0000 g | Freq: Once | INTRAVENOUS | Status: AC
  Administered 2022-02-22: 2 g via INTRAVENOUS
  Filled 2022-02-22: qty 50

## 2022-02-22 MED ORDER — SODIUM CHLORIDE 0.9 % IV SOLN
10.0000 mg | Freq: Once | INTRAVENOUS | Status: AC
  Administered 2022-02-22: 10 mg via INTRAVENOUS
  Filled 2022-02-22: qty 1

## 2022-02-22 NOTE — Progress Notes (Signed)
Pharmacist Chemotherapy Monitoring - Initial Assessment   ? ?Anticipated start date: 02/22/22  ? ?The following has been reviewed per standard work regarding the patient's treatment regimen: ?The patient's diagnosis, treatment plan and drug doses, and organ/hematologic function ?Lab orders and baseline tests specific to treatment regimen  ?The treatment plan start date, drug sequencing, and pre-medications ?Prior authorization status  ?Patient's documented medication list, including drug-drug interaction screen and prescriptions for anti-emetics and supportive care specific to the treatment regimen ?The drug concentrations, fluid compatibility, administration routes, and timing of the medications to be used ?The patient's access for treatment and lifetime cumulative dose history, if applicable  ?The patient's medication allergies and previous infusion related reactions, if applicable  ? ?Changes made to treatment plan:  ?N/A ? ?Follow up needed:  ?N/A ? ? ?Patrica Duel, RPH, ?02/22/2022  11:12 AM  ?

## 2022-02-22 NOTE — Progress Notes (Signed)
Patient presents for treatment. RN assessment completed along with the following: ? ?Labs/vitals reviewed - Yes, and within treatment parameters.   ?Weight within 10% of previous measurement - Yes ?Informed consent completed and reflects current therapy/intent - Yes, on date 02/22/22             ?Provider progress note reviewed - 02/17/22 ? ? ?Treatment/Antibody/Supportive plan reviewed - Yes, and there are no adjustments needed for today's treatment. ?S&H and other orders reviewed - Yes, and there are no additional orders identified. ?Previous treatment date reviewed - Yes, and the appropriate amount of time has elapsed between treatments. ?Clinic Hand Off Received from - none ? ? ?Patient to proceed with treatment.  ? ?

## 2022-02-22 NOTE — Patient Instructions (Signed)
[facility]   ?Discharge Instructions: ?Thank you for choosing [facility] to provide your oncology and hematology care.  ? ?If you have a lab appointment with the [facility], please go directly to the [facility] and check in at the registration area. ?  ?Wear comfortable clothing and clothing appropriate for easy access to any Portacath or PICC line.  ? ?We strive to give you quality time with your provider. You may need to reschedule your appointment if you arrive late (15 or more minutes).  Arriving late affects you and other patients whose appointments are after yours.  Also, if you miss three or more appointments without notifying the office, you may be dismissed from the clinic at the provider?s discretion.    ?  ?For prescription refill requests, have your pharmacy contact our office and allow 72 hours for refills to be completed.   ? ?Today you received the following chemotherapy and/or immunotherapy agents Gemcitabine, Cisplatin, Transtuzumab-anns    ?  ?To help prevent nausea and vomiting after your treatment, we encourage you to take your nausea medication as directed. ? ?BELOW ARE SYMPTOMS THAT SHOULD BE REPORTED IMMEDIATELY: ?*FEVER GREATER THAN 100.4 F (38 ?C) OR HIGHER ?*CHILLS OR SWEATING ?*NAUSEA AND VOMITING THAT IS NOT CONTROLLED WITH YOUR NAUSEA MEDICATION ?*UNUSUAL SHORTNESS OF BREATH ?*UNUSUAL BRUISING OR BLEEDING ?*URINARY PROBLEMS (pain or burning when urinating, or frequent urination) ?*BOWEL PROBLEMS (unusual diarrhea, constipation, pain near the anus) ?TENDERNESS IN MOUTH AND THROAT WITH OR WITHOUT PRESENCE OF ULCERS (sore throat, sores in mouth, or a toothache) ?UNUSUAL RASH, SWELLING OR PAIN  ?UNUSUAL VAGINAL DISCHARGE OR ITCHING  ? ?Items with * indicate a potential emergency and should be followed up as soon as possible or go to the Emergency Department if any problems should occur. ? ?Please show the CHEMOTHERAPY ALERT CARD or  IMMUNOTHERAPY ALERT CARD at check-in to the Emergency Department and triage nurse. ? ?Should you have questions after your visit or need to cancel or reschedule your appointment, please contact [facility]  Dept: [phone]  and follow the prompts.  Office hours are 8:00 a.m. to 4:30 p.m. Monday - Friday. Please note that voicemails left after 4:00 p.m. may not be returned until the following business day.  We are closed weekends and major holidays. You have access to a nurse at all times for urgent questions. Please call the main number to the clinic Dept: [phone] and follow the prompts. ? ? ?For any non-urgent questions, you may also contact your provider using MyChart. We now offer e-Visits for anyone 8 and older to request care online for non-urgent symptoms. For details visit [email or web address]. ?  ?Also download the MyChart app! Go to the app store, search "MyChart", open the app, select [facility], and log in with your MyChart username and password. ? ?Due to Covid, a mask is required upon entering the hospital/clinic. If you do not have a mask, one will be given to you upon arrival. For doctor visits, patients may have 1 support person aged 15 or older with them. For treatment visits, patients cannot have anyone with them due to current Covid guidelines and our immunocompromised population.  ? ?Gemcitabine injection ?What is this medication? ?GEMCITABINE (jem SYE ta been) is a chemotherapy drug. This medicine is used to treat many types of cancer like breast cancer, lung cancer, pancreatic cancer, and ovarian cancer. ?This medicine may be used for other purposes; ask your health  care provider or pharmacist if you have questions. ?COMMON BRAND NAME(S): Gemzar, Infugem ?What should I tell my care team before I take this medication? ?They need to know if you have any of these conditions: ?blood disorders ?infection ?kidney disease ?liver disease ?lung or breathing  disease, like asthma ?recent or ongoing radiation therapy ?an unusual or allergic reaction to gemcitabine, other chemotherapy, other medicines, foods, dyes, or preservatives ?pregnant or trying to get pregnant ?breast-feeding ?How should I use this medication? ?This drug is given as an infusion into a vein. It is administered in a hospital or clinic by a specially trained health care professional. ?Talk to your pediatrician regarding the use of this medicine in children. Special care may be needed. ?Overdosage: If you think you have taken too much of this medicine contact a poison control center or emergency room at once. ?NOTE: This medicine is only for you. Do not share this medicine with others. ?What if I miss a dose? ?It is important not to miss your dose. Call your doctor or health care professional if you are unable to keep an appointment. ?What may interact with this medication? ?medicines to increase blood counts like filgrastim, pegfilgrastim, sargramostim ?some other chemotherapy drugs like cisplatin ?vaccines ?Talk to your doctor or health care professional before taking any of these medicines: ?acetaminophen ?aspirin ?ibuprofen ?ketoprofen ?naproxen ?This list may not describe all possible interactions. Give your health care provider a list of all the medicines, herbs, non-prescription drugs, or dietary supplements you use. Also tell them if you smoke, drink alcohol, or use illegal drugs. Some items may interact with your medicine. ?What should I watch for while using this medication? ?Visit your doctor for checks on your progress. This drug may make you feel generally unwell. This is not uncommon, as chemotherapy can affect healthy cells as well as cancer cells. Report any side effects. Continue your course of treatment even though you feel ill unless your doctor tells you to stop. ?In some cases, you may be given additional medicines to help with side effects. Follow all directions for their  use. ?Call your doctor or health care professional for advice if you get a fever, chills or sore throat, or other symptoms of a cold or flu. Do not treat yourself. This drug decreases your body's ability to fight infections. Try to avoid being around people who are sick. ?This medicine may increase your risk to bruise or bleed. Call your doctor or health care professional if you notice any unusual bleeding. ?Be careful brushing and flossing your teeth or using a toothpick because you may get an infection or bleed more easily. If you have any dental work done, tell your dentist you are receiving this medicine. ?Avoid taking products that contain aspirin, acetaminophen, ibuprofen, naproxen, or ketoprofen unless instructed by your doctor. These medicines may hide a fever. ?Do not become pregnant while taking this medicine or for 6 months after stopping it. Women should inform their doctor if they wish to become pregnant or think they might be pregnant. Men should not father a child while taking this medicine and for 3 months after stopping it. There is a potential for serious side effects to an unborn child. Talk to your health care professional or pharmacist for more information. Do not breast-feed an infant while taking this medicine or for at least 1 week after stopping it. ?Men should inform their doctors if they wish to father a child. This medicine may lower sperm counts. Talk with your doctor  or health care professional if you are concerned about your fertility. ?What side effects may I notice from receiving this medication? ?Side effects that you should report to your doctor or health care professional as soon as possible: ?allergic reactions like skin rash, itching or hives, swelling of the face, lips, or tongue ?breathing problems ?pain, redness, or irritation at site where injected ?signs and symptoms of a dangerous change in heartbeat or heart rhythm like chest pain; dizziness; fast or irregular heartbeat;  palpitations; feeling faint or lightheaded, falls; breathing problems ?signs of decreased platelets or bleeding - bruising, pinpoint red spots on the skin, black, tarry stools, blood in the urine ?signs of decreased red blood

## 2022-02-22 NOTE — Progress Notes (Signed)
Per Dr Benson Norway to proceed with the cisplatin, be sure he gets the post hydration and voids before he leaves ?

## 2022-02-22 NOTE — Progress Notes (Signed)
Pt took home oxycodone-tylenol medication at 10:05 ? ?

## 2022-02-22 NOTE — Patient Instructions (Signed)
[facility]  ? Discharge Instructions: ?Thank you for choosing [facility] to provide your oncology and hematology care.  ? ?If you have a lab appointment with the [facility], please go directly to the [facility] and check in at the registration area. ?  ?Wear comfortable clothing and clothing appropriate for easy access to any Portacath or PICC line.  ? ?We strive to give you quality time with your provider. You may need to reschedule your appointment if you arrive late (15 or more minutes).  Arriving late affects you and other patients whose appointments are after yours.  Also, if you miss three or more appointments without notifying the office, you may be dismissed from the clinic at the provider?s discretion.    ?  ?For prescription refill requests, have your pharmacy contact our office and allow 72 hours for refills to be completed.   ? ?Today you received the following chemotherapy and/or immunotherapy agents Gemzar, Trastuzumab-awwb, Cisplatin    ?  ?To help prevent nausea and vomiting after your treatment, we encourage you to take your nausea medication as directed. ? ?BELOW ARE SYMPTOMS THAT SHOULD BE REPORTED IMMEDIATELY: ?*FEVER GREATER THAN 100.4 F (38 ?C) OR HIGHER ?*CHILLS OR SWEATING ?*NAUSEA AND VOMITING THAT IS NOT CONTROLLED WITH YOUR NAUSEA MEDICATION ?*UNUSUAL SHORTNESS OF BREATH ?*UNUSUAL BRUISING OR BLEEDING ?*URINARY PROBLEMS (pain or burning when urinating, or frequent urination) ?*BOWEL PROBLEMS (unusual diarrhea, constipation, pain near the anus) ?TENDERNESS IN MOUTH AND THROAT WITH OR WITHOUT PRESENCE OF ULCERS (sore throat, sores in mouth, or a toothache) ?UNUSUAL RASH, SWELLING OR PAIN  ?UNUSUAL VAGINAL DISCHARGE OR ITCHING  ? ?Items with * indicate a potential emergency and should be followed up as soon as possible or go to the Emergency Department if any problems should occur. ? ?Please show the CHEMOTHERAPY ALERT CARD or IMMUNOTHERAPY  ALERT CARD at check-in to the Emergency Department and triage nurse. ? ?Should you have questions after your visit or need to cancel or reschedule your appointment, please contact [facility]  Dept: [phone]  and follow the prompts.  Office hours are 8:00 a.m. to 4:30 p.m. Monday - Friday. Please note that voicemails left after 4:00 p.m. may not be returned until the following business day.  We are closed weekends and major holidays. You have access to a nurse at all times for urgent questions. Please call the main number to the clinic Dept: [phone] and follow the prompts. ? ? ?For any non-urgent questions, you may also contact your provider using MyChart. We now offer e-Visits for anyone 69 and older to request care online for non-urgent symptoms. For details visit [email or web address]. ?  ?Also download the MyChart app! Go to the app store, search "MyChart", open the app, select [facility], and log in with your MyChart username and password. ? ?Due to Covid, a mask is required upon entering the hospital/clinic. If you do not have a mask, one will be given to you upon arrival. For doctor visits, patients may have 1 support person aged 4 or older with them. For treatment visits, patients cannot have anyone with them due to current Covid guidelines and our immunocompromised population.  ?Gemcitabine injection ?What is this medication? ?GEMCITABINE (jem SYE ta been) is a chemotherapy drug. This medicine is used to treat many types of cancer like breast cancer, lung cancer, pancreatic cancer, and ovarian cancer. ?This medicine may be used for other purposes; ask your health care  provider or pharmacist if you have questions. ?COMMON BRAND NAME(S): Gemzar, Infugem ?What should I tell my care team before I take this medication? ?They need to know if you have any of these conditions: ?blood disorders ?infection ?kidney disease ?liver disease ?lung or breathing disease, like  asthma ?recent or ongoing radiation therapy ?an unusual or allergic reaction to gemcitabine, other chemotherapy, other medicines, foods, dyes, or preservatives ?pregnant or trying to get pregnant ?breast-feeding ?How should I use this medication? ?This drug is given as an infusion into a vein. It is administered in a hospital or clinic by a specially trained health care professional. ?Talk to your pediatrician regarding the use of this medicine in children. Special care may be needed. ?Overdosage: If you think you have taken too much of this medicine contact a poison control center or emergency room at once. ?NOTE: This medicine is only for you. Do not share this medicine with others. ?What if I miss a dose? ?It is important not to miss your dose. Call your doctor or health care professional if you are unable to keep an appointment. ?What may interact with this medication? ?medicines to increase blood counts like filgrastim, pegfilgrastim, sargramostim ?some other chemotherapy drugs like cisplatin ?vaccines ?Talk to your doctor or health care professional before taking any of these medicines: ?acetaminophen ?aspirin ?ibuprofen ?ketoprofen ?naproxen ?This list may not describe all possible interactions. Give your health care provider a list of all the medicines, herbs, non-prescription drugs, or dietary supplements you use. Also tell them if you smoke, drink alcohol, or use illegal drugs. Some items may interact with your medicine. ?What should I watch for while using this medication? ?Visit your doctor for checks on your progress. This drug may make you feel generally unwell. This is not uncommon, as chemotherapy can affect healthy cells as well as cancer cells. Report any side effects. Continue your course of treatment even though you feel ill unless your doctor tells you to stop. ?In some cases, you may be given additional medicines to help with side effects. Follow all directions for their use. ?Call your doctor  or health care professional for advice if you get a fever, chills or sore throat, or other symptoms of a cold or flu. Do not treat yourself. This drug decreases your body's ability to fight infections. Try to avoid being around people who are sick. ?This medicine may increase your risk to bruise or bleed. Call your doctor or health care professional if you notice any unusual bleeding. ?Be careful brushing and flossing your teeth or using a toothpick because you may get an infection or bleed more easily. If you have any dental work done, tell your dentist you are receiving this medicine. ?Avoid taking products that contain aspirin, acetaminophen, ibuprofen, naproxen, or ketoprofen unless instructed by your doctor. These medicines may hide a fever. ?Do not become pregnant while taking this medicine or for 6 months after stopping it. Women should inform their doctor if they wish to become pregnant or think they might be pregnant. Men should not father a child while taking this medicine and for 3 months after stopping it. There is a potential for serious side effects to an unborn child. Talk to your health care professional or pharmacist for more information. Do not breast-feed an infant while taking this medicine or for at least 1 week after stopping it. ?Men should inform their doctors if they wish to father a child. This medicine may lower sperm counts. Talk with your doctor or  health care professional if you are concerned about your fertility. ?What side effects may I notice from receiving this medication? ?Side effects that you should report to your doctor or health care professional as soon as possible: ?allergic reactions like skin rash, itching or hives, swelling of the face, lips, or tongue ?breathing problems ?pain, redness, or irritation at site where injected ?signs and symptoms of a dangerous change in heartbeat or heart rhythm like chest pain; dizziness; fast or irregular heartbeat; palpitations; feeling  faint or lightheaded, falls; breathing problems ?signs of decreased platelets or bleeding - bruising, pinpoint red spots on the skin, black, tarry stools, blood in the urine ?signs of decreased red blood cells -

## 2022-02-22 NOTE — Progress Notes (Signed)
OK per MD to give cisplatin with 150 mL voided urine.  ?

## 2022-02-23 ENCOUNTER — Encounter: Payer: Self-pay | Admitting: *Deleted

## 2022-02-23 ENCOUNTER — Telehealth: Payer: Self-pay | Admitting: Family Medicine

## 2022-02-23 DIAGNOSIS — C801 Malignant (primary) neoplasm, unspecified: Secondary | ICD-10-CM | POA: Diagnosis not present

## 2022-02-23 LAB — CANCER ANTIGEN 19-9: CA 19-9: 339 U/mL — ABNORMAL HIGH (ref 0–35)

## 2022-02-23 NOTE — Telephone Encounter (Signed)
Pt spouse calling in regarding the usage of the dexcom cgm which needs to be anchored in the abdominal area and pt also is injecting lovenox into his abdominal area. Pt wants clarification as to whether those it's alright to have both in the same are of the body or can one be moved to the arm.  ?

## 2022-02-23 NOTE — Telephone Encounter (Signed)
I called [facility] on [address] and spoke with Saralyn Pilar, the pharmacist regarding the question as below.  Saralyn Pilar looked up information and stated he does not see any indications from the manufacturer stating the two cannot be used at the same time.  I spoke with the patient's wife, informed her of this and advised she contact the pharmacy with any further questions.   ?

## 2022-02-23 NOTE — Progress Notes (Signed)
PATIENT NAVIGATOR PROGRESS NOTE ? ?Name: Robert Warner ?Date: 02/23/2022 ?MRN: [id number]  ?DOB: 1941/09/02 ? ? ?Reason for visit:  ?F/U call after new treatment regimen ? ?Comments:  Called and spoke with daughter [name], she stated that he is up and has had some breakfast, c/o of a little nausea and she gave him anti nausea medicine. ? ?Encouraged to eat small meals/snacks every 3-4 hours and encouraged fluids, greater than 64 oz a day.  ? ?Encouraged to call with any issues or questions ? ? ? ?Time spent counseling/coordinating care: 15-30 minutes ? ?

## 2022-02-24 ENCOUNTER — Telehealth: Payer: Self-pay | Admitting: Family Medicine

## 2022-02-24 LAB — GUARDANT 360

## 2022-02-24 MED ORDER — FREESTYLE LIBRE 2 READER DEVI: 0 refills | Status: DC

## 2022-02-24 MED ORDER — FREESTYLE LIBRE 2 SENSOR MISC
0 refills | Status: DC
Start: 2022-02-24 — End: 2022-03-24

## 2022-02-24 NOTE — Telephone Encounter (Signed)
[employer] copy with [facility] is calling and would like order for nursing to come out for dm monitoring and to give lovenox injections ?

## 2022-02-24 NOTE — Telephone Encounter (Signed)
Rx's sent in. °

## 2022-02-24 NOTE — Telephone Encounter (Signed)
Robert Warner called asking if Dr. Elease Hashimoto can send Rx for Freestyle Libra 2 to [facility], [address] AT [address] they only need 1 box/14 day supply until their Dexcom Replacement transmitter comes. The Dexcom delivery is schedule to come between 3-4 days from today.  ? ?Please advise.  ?

## 2022-02-27 ENCOUNTER — Telehealth: Payer: Self-pay

## 2022-02-27 NOTE — Telephone Encounter (Signed)
Return call from Pt's wife inquired about Pt still having pain and describing his pain as the same as when he had gastritis. Pt on protonix. Discussed with Dr Benay Spice who stated Pt should take some maalox and continue taking the oxycodone. Pt has an appointment next Thursday. Informed Pt's wife if anything should change she can give a return call to the office. ?

## 2022-02-27 NOTE — Telephone Encounter (Signed)
Left a detailed message informing Robert Warner of verbal orders  ?

## 2022-02-27 NOTE — Telephone Encounter (Signed)
V/M from Pt's wife stating Pt still having pain even though he is taking the oxycodone. V/M message left for Pt's wife to return call to the office. ?

## 2022-02-28 ENCOUNTER — Encounter: Payer: Medicare HMO | Attending: Registered Nurse | Admitting: Registered Nurse

## 2022-02-28 ENCOUNTER — Encounter: Payer: Self-pay | Admitting: Registered Nurse

## 2022-02-28 VITALS — BP 132/88 | HR 73 | Ht 70.0 in | Wt 176.0 lb

## 2022-02-28 DIAGNOSIS — I639 Cerebral infarction, unspecified: Secondary | ICD-10-CM | POA: Diagnosis not present

## 2022-02-28 MED ORDER — ENOXAPARIN SODIUM 120 MG/0.8ML IJ SOSY: 120.0000 mg | PREFILLED_SYRINGE | INTRAMUSCULAR | 0 refills | Status: DC

## 2022-02-28 NOTE — Progress Notes (Signed)
? ?Subjective:  ? ? Patient ID: Robert Warner, male    DOB: August 10, 1941, 81 y.o.   MRN: [id number] ? ?HPI: Robert Warner is a 81 y.o. male who is here for [place] appointment for F/U of his CVA and Functional Deficits due to Right MCA Territory Stroke.  He presented to [facility] on 01/23/2022 with acte weakness.  ? ?Dr. Dina Rich H&P: 01/23/2022 ?Patient arrived with concern for acute weakness.  Went to bed around 8:30 PM and was at his normal state of health.  Woke up this morning feeling like he had difficulty walking and had left sided weakness.  No speech or vision deficits noted.  Of note, recent hospital admission with multiple embolic infarcts.  He is currently on Eliquis.  On my exam, he is awake, alert, oriented.  No facial droop noted.  Cranial nerves appear intact.  He has slightly reduced grip strength on the left and drift in the left lower extremity with 4+ out of 5 strength.  Gait testing was deferred.  He is out of the window for tPA and is already on Eliquis.  Do not feel he meets LVO criteria.  Stroke work-up including teleneurology assessment ordered.  Patient handed off to oncoming physician for full evaluation and work-up. ?  ?Merryl Hacker, MD ?01/23/22 [id number] ? ?CT Head WO Contrast:  ?IMPRESSION: ?1. No acute intracranial abnormalities. ?2. Chronic small vessel ischemic disease and brain atrophy. ?3. Remote lacunar infarcts within bilateral basal ganglia, right ?thalamus and bilateral cerebellar hemispheres. ? ?CTA: ?IMPRESSION: ?1.  No intracranial large vessel occlusion or significant stenosis. ?2.  No hemodynamically significant stenosis in the neck. ?3. Interval increase in the size of the right upper lobe nodule, ?which now measures up to 12 mm, previously 9 mm on 12/04/2021. ? ?MR: Brain ?IMPRESSION: ?1. Multiple, mostly punctate acute infarcts in the ?right-greater-than-left cerebral hemispheres and left cerebellar ?hemisphere, with a larger area of infarction in the posterior  right ?frontal lobe. Given multiple vascular territories an embolic ?etiology is favored. ?2. Area of enhancement in the left cerebellar hemisphere, which is ?favored to represent a subacute infarct. ? ?Robert Warner was admitted to inpatient rehabilitation on 01/27/2022 and discharged home on 02/09/2022. He is receiving Home Health Therapy from [facility]. He denies any pain at this time. He rated his pain 3 on Health and History.  ?Also reports his appetite is fair.  ?  ?Pain Inventory ?Average Pain 8 ?Pain Right Now 3 ?My pain is dull ? ?LOCATION OF PAIN  Abdomen ? ?BOWEL ?Number of stools per week: 7 ?Oral laxative use Yes  ?Type of laxative Senna ? ? ?BLADDER ?Normal ?Bladder incontinence No  ?Frequent urination Yes  ?Leakage with coughing No  ?Difficulty starting stream No  ?Incomplete bladder emptying No  ? ? ?Mobility ?use a walker ?ability to climb steps?  yes ?do you drive?  no ? ?Function ?retired ?I need assistance with the following:  bathing, meal prep, household duties, and shopping ?Do you have any goals in this area?  yes ? ?Neuro/Psych ?bladder control problems ?weakness ?trouble walking ? ?Prior Studies ?Any changes since last visit?  no ? ?Physicians involved in your care ?Any changes since last visit?  no ? ? ?Family History  ?Problem Relation Age of Onset  ? Hypertension Mother   ? Coronary artery disease Mother   ? Stroke Father 47  ? Stroke Sister   ? Pancreatic cancer Brother   ? Diabetes Maternal Aunt   ? Coronary  artery disease Maternal Uncle   ?     2 Maternal Uncles   ? Diabetes Maternal Uncle   ? Breast cancer Paternal Aunt   ? Coronary artery disease Paternal Aunt   ? Coronary artery disease Maternal Grandmother   ? Stroke Paternal Grandmother   ? Heart disease Neg Hx   ? Colon cancer Neg Hx   ? Esophageal cancer Neg Hx   ? Rectal cancer Neg Hx   ? Stomach cancer Neg Hx   ? ?Social History  ? ?Socioeconomic History  ? Marital status: Married  ?  Spouse name: Not on file  ? Number of  children: 2  ? Years of education: Not on file  ? Highest education level: Not on file  ?Occupational History  ? Occupation: retired Airline pilot  ?Tobacco Use  ? Smoking status: Former  ?  Packs/day: 0.50  ?  Years: 10.00  ?  Pack years: 5.00  ?  Types: Cigarettes  ?  Quit date: 11/13/1966  ?  Years since quitting: 55.3  ? Smokeless tobacco: Never  ?Vaping Use  ? Vaping Use: Never used  ?Substance and Sexual Activity  ? Alcohol use: Not Currently  ?  Alcohol/week: 2.0 standard drinks  ?  Types: 2 Glasses of wine per week  ?  Comment: Occasional wine or beer  ? Drug use: No  ? Sexual activity: Not on file  ?Other Topics Concern  ? Not on file  ?Social History Narrative  ? Lives at home with wife  ? Right handed  ? Caffeine: 1 cup occasionally   ? ?Social Determinants of Health  ? ?Financial Resource Strain: Low Risk   ? Difficulty of Paying Living Expenses: Not hard at all  ?Food Insecurity: No Food Insecurity  ? Worried About Charity fundraiser in the Last Year: Never true  ? Ran Out of Food in the Last Year: Never true  ?Transportation Needs: No Transportation Needs  ? Lack of Transportation (Medical): No  ? Lack of Transportation (Non-Medical): No  ?Physical Activity: Sufficiently Active  ? Days of Exercise per Week: 3 days  ? Minutes of Exercise per Session: 60 min  ?Stress: No Stress Concern Present  ? Feeling of Stress : Not at all  ?Social Connections: Socially Integrated  ? Frequency of Communication with Friends and Family: Three times a week  ? Frequency of Social Gatherings with Friends and Family: Three times a week  ? Attends Religious Services: More than 4 times per year  ? Active Member of Clubs or Organizations: Yes  ? Attends Archivist Meetings: More than 4 times per year  ? Marital Status: Married  ? ?Past Surgical History:  ?Procedure Laterality Date  ? APPENDECTOMY  1968  ? CATARACT EXTRACTION Bilateral 07/2000  ? states 2 weeks ago (first of sept) OD due in Dec   ? COLONOSCOPY   2004,2009  ? Negative, Dr. Sharlett Iles   ? IR IMAGING GUIDED PORT INSERTION  10/14/2021  ? KIDNEY STONE SURGERY  10/2018  ? PROSTATE BIOPSY  2006  ? Dr.Sigmund Tannebaum  ? ?Past Medical History:  ?Diagnosis Date  ? Arthritis   ? Cancer [facility])   ? Cataract   ? Diabetes mellitus   ? Hyperlipidemia   ? Stroke [facility])   ? ?BP 132/88   Pulse 73   Ht '5\' 10"'$  (1.778 m)   Wt 176 lb (79.8 kg)   SpO2 97%   BMI 25.25 kg/m?  ? ?Opioid Risk Score:   ?  Fall Risk Score:  `1 ? ?Depression screen PHQ 2/9 ? ? ?  02/28/2022  ?  3:04 PM 01/16/2022  ?  4:05 PM 01/03/2022  ?  8:46 AM 12/02/2021  ?  3:09 PM 08/29/2021  ?  8:31 AM 08/10/2021  ?  1:22 PM 08/10/2021  ?  1:19 PM  ?Depression screen PHQ 2/9  ?Decreased Interest 2  0 0 0 0 0  ?Down, Depressed, Hopeless 1 0 0 0 0 0 0  ?PHQ - 2 Score 3 0 0 0 0 0 0  ?Altered sleeping 2 0       ?Tired, decreased energy 3 0       ?Change in appetite 1 0       ?Feeling bad or failure about yourself  0 0       ?Trouble concentrating 2 0       ?Moving slowly or fidgety/restless 1 0       ?Suicidal thoughts 0 0       ?PHQ-9 Score 12 0       ?Difficult doing work/chores Somewhat difficult        ?  ? ?Review of Systems  ?Constitutional: Negative.   ?HENT: Negative.    ?Eyes: Negative.   ?Respiratory: Negative.    ?Cardiovascular: Negative.   ?Gastrointestinal:  Positive for abdominal pain.  ?Endocrine: Negative.   ?Genitourinary: Negative.   ?Musculoskeletal: Negative.   ?Skin: Negative.   ?Allergic/Immunologic: Negative.   ?Neurological: Negative.   ?Hematological: Negative.   ?Psychiatric/Behavioral: Negative.    ? ?   ?Objective:  ? Physical Exam ?Vitals and nursing note reviewed.  ?Constitutional:   ?   Appearance: Normal appearance.  ?Cardiovascular:  ?   Rate and Rhythm: Normal rate and regular rhythm.  ?   Pulses: Normal pulses.  ?   Heart sounds: Normal heart sounds.  ?Pulmonary:  ?   Effort: Pulmonary effort is normal.  ?   Breath sounds: Normal breath sounds.  ?Musculoskeletal:  ?   Cervical back:  Normal range of motion and neck supple.  ?   Comments: Normal Muscle Bulk and Muscle Testing Reveals:  ?Upper Extremities: Full ROM and Muscle Strength 5/5 ?Lower Extremities: Full ROM and Muscle Strength 5/5 ?Arises from T

## 2022-03-03 ENCOUNTER — Other Ambulatory Visit: Payer: Self-pay | Admitting: Physician Assistant

## 2022-03-03 ENCOUNTER — Other Ambulatory Visit: Payer: Self-pay | Admitting: Nurse Practitioner

## 2022-03-03 ENCOUNTER — Other Ambulatory Visit: Payer: Self-pay | Admitting: *Deleted

## 2022-03-03 DIAGNOSIS — C229 Malignant neoplasm of liver, not specified as primary or secondary: Secondary | ICD-10-CM

## 2022-03-03 MED ORDER — OXYCODONE-ACETAMINOPHEN 5-325 MG PO TABS: 1.0000 | ORAL_TABLET | ORAL | 0 refills | Status: DC | PRN

## 2022-03-03 MED ORDER — PANTOPRAZOLE SODIUM 20 MG PO TBEC: DELAYED_RELEASE_TABLET | ORAL | 3 refills | Status: DC

## 2022-03-04 ENCOUNTER — Other Ambulatory Visit: Payer: Self-pay | Admitting: Oncology

## 2022-03-06 ENCOUNTER — Telehealth: Payer: Self-pay | Admitting: Family Medicine

## 2022-03-06 NOTE — Telephone Encounter (Signed)
Robert Warner  ATA from [facility] call and stated pt caregiver stated pt had a fall on last Thursday at 2:00 AM at the foot of the bed and receive minor hurt it was a scratch on his elbow Tom's # is [phone]. ?

## 2022-03-06 NOTE — Telephone Encounter (Signed)
Tom informed of the message and verbalized understanding.  ?

## 2022-03-09 ENCOUNTER — Encounter: Payer: Self-pay | Admitting: Nurse Practitioner

## 2022-03-09 ENCOUNTER — Inpatient Hospital Stay: Payer: Medicare HMO

## 2022-03-09 ENCOUNTER — Inpatient Hospital Stay (HOSPITAL_BASED_OUTPATIENT_CLINIC_OR_DEPARTMENT_OTHER): Payer: Medicare HMO | Admitting: Nurse Practitioner

## 2022-03-09 VITALS — BP 110/81 | HR 94 | Temp 98.2°F | Resp 18 | Ht 70.0 in | Wt 174.4 lb

## 2022-03-09 DIAGNOSIS — Z5112 Encounter for antineoplastic immunotherapy: Secondary | ICD-10-CM | POA: Diagnosis not present

## 2022-03-09 DIAGNOSIS — C801 Malignant (primary) neoplasm, unspecified: Secondary | ICD-10-CM

## 2022-03-09 DIAGNOSIS — C787 Secondary malignant neoplasm of liver and intrahepatic bile duct: Secondary | ICD-10-CM | POA: Diagnosis not present

## 2022-03-09 DIAGNOSIS — R350 Frequency of micturition: Secondary | ICD-10-CM | POA: Diagnosis not present

## 2022-03-09 DIAGNOSIS — I129 Hypertensive chronic kidney disease with stage 1 through stage 4 chronic kidney disease, or unspecified chronic kidney disease: Secondary | ICD-10-CM | POA: Diagnosis not present

## 2022-03-09 DIAGNOSIS — R911 Solitary pulmonary nodule: Secondary | ICD-10-CM | POA: Diagnosis not present

## 2022-03-09 DIAGNOSIS — Z5111 Encounter for antineoplastic chemotherapy: Secondary | ICD-10-CM | POA: Diagnosis not present

## 2022-03-09 DIAGNOSIS — I639 Cerebral infarction, unspecified: Secondary | ICD-10-CM | POA: Diagnosis not present

## 2022-03-09 DIAGNOSIS — E1122 Type 2 diabetes mellitus with diabetic chronic kidney disease: Secondary | ICD-10-CM | POA: Diagnosis not present

## 2022-03-09 LAB — CBC WITH DIFFERENTIAL (CANCER CENTER ONLY)
Abs Immature Granulocytes: 0 10*3/uL (ref 0.00–0.07)
Basophils Absolute: 0 10*3/uL (ref 0.0–0.1)
Basophils Relative: 1 %
Eosinophils Absolute: 0.1 10*3/uL (ref 0.0–0.5)
Eosinophils Relative: 2 %
HCT: 34.3 % — ABNORMAL LOW (ref 39.0–52.0)
Hemoglobin: 11 g/dL — ABNORMAL LOW (ref 13.0–17.0)
Immature Granulocytes: 0 %
Lymphocytes Relative: 36 %
Lymphs Abs: 1.3 10*3/uL (ref 0.7–4.0)
MCH: 28 pg (ref 26.0–34.0)
MCHC: 32.1 g/dL (ref 30.0–36.0)
MCV: 87.3 fL (ref 80.0–100.0)
Monocytes Absolute: 0.4 10*3/uL (ref 0.1–1.0)
Monocytes Relative: 10 %
Neutro Abs: 1.8 10*3/uL (ref 1.7–7.7)
Neutrophils Relative %: 51 %
Platelet Count: 237 10*3/uL (ref 150–400)
RBC: 3.93 MIL/uL — ABNORMAL LOW (ref 4.22–5.81)
RDW: 12.9 % (ref 11.5–15.5)
WBC Count: 3.6 10*3/uL — ABNORMAL LOW (ref 4.0–10.5)
nRBC: 0 % (ref 0.0–0.2)

## 2022-03-09 LAB — CMP (CANCER CENTER ONLY)
ALT: 29 U/L (ref 0–44)
AST: 36 U/L (ref 15–41)
Albumin: 3.6 g/dL (ref 3.5–5.0)
Alkaline Phosphatase: 158 U/L — ABNORMAL HIGH (ref 38–126)
Anion gap: 8 (ref 5–15)
BUN: 22 mg/dL (ref 8–23)
CO2: 27 mmol/L (ref 22–32)
Calcium: 10 mg/dL (ref 8.9–10.3)
Chloride: 100 mmol/L (ref 98–111)
Creatinine: 1.26 mg/dL — ABNORMAL HIGH (ref 0.61–1.24)
GFR, Estimated: 57 mL/min — ABNORMAL LOW (ref >60)
Glucose, Bld: 116 mg/dL — ABNORMAL HIGH (ref 70–99)
Potassium: 4.1 mmol/L (ref 3.5–5.1)
Sodium: 135 mmol/L (ref 135–145)
Total Bilirubin: 0.6 mg/dL (ref 0.3–1.2)
Total Protein: 7.4 g/dL (ref 6.5–8.1)

## 2022-03-09 LAB — MAGNESIUM: Magnesium: 1.7 mg/dL (ref 1.7–2.4)

## 2022-03-09 MED ORDER — POTASSIUM CHLORIDE IN NACL 20-0.9 MEQ/L-% IV SOLN
Freq: Once | INTRAVENOUS | Status: AC
  Filled 2022-03-09: qty 1000

## 2022-03-09 MED ORDER — MAGNESIUM SULFATE 2 GM/50ML IV SOLN
2.0000 g | Freq: Once | INTRAVENOUS | Status: AC
  Administered 2022-03-09: 2 g via INTRAVENOUS
  Filled 2022-03-09: qty 50

## 2022-03-09 MED ORDER — DIPHENHYDRAMINE HCL 25 MG PO CAPS
25.0000 mg | ORAL_CAPSULE | Freq: Once | ORAL | Status: AC
  Administered 2022-03-09: 25 mg via ORAL
  Filled 2022-03-09: qty 1

## 2022-03-09 MED ORDER — TRASTUZUMAB-ANNS CHEMO 150 MG IV SOLR
150.0000 mg | Freq: Once | INTRAVENOUS | Status: AC
  Administered 2022-03-09: 150 mg via INTRAVENOUS
  Filled 2022-03-09: qty 7.14

## 2022-03-09 MED ORDER — SODIUM CHLORIDE 0.9 % IV SOLN
150.0000 mg | Freq: Once | INTRAVENOUS | Status: AC
  Administered 2022-03-09: 150 mg via INTRAVENOUS
  Filled 2022-03-09: qty 5

## 2022-03-09 MED ORDER — HEPARIN SOD (PORK) LOCK FLUSH 100 UNIT/ML IV SOLN
500.0000 [IU] | Freq: Once | INTRAVENOUS | Status: AC | PRN
  Administered 2022-03-09: 500 [IU]

## 2022-03-09 MED ORDER — SODIUM CHLORIDE 0.9 % IV SOLN: Freq: Once | INTRAVENOUS | Status: AC

## 2022-03-09 MED ORDER — SODIUM CHLORIDE 0.9 % IV SOLN
10.0000 mg | Freq: Once | INTRAVENOUS | Status: AC
  Administered 2022-03-09: 10 mg via INTRAVENOUS
  Filled 2022-03-09: qty 1

## 2022-03-09 MED ORDER — ACETAMINOPHEN 325 MG PO TABS
650.0000 mg | ORAL_TABLET | Freq: Once | ORAL | Status: AC
  Administered 2022-03-09: 650 mg via ORAL
  Filled 2022-03-09: qty 2

## 2022-03-09 MED ORDER — SODIUM CHLORIDE 0.9 % IV SOLN
800.0000 mg/m2 | Freq: Once | INTRAVENOUS | Status: AC
  Administered 2022-03-09: 1634 mg via INTRAVENOUS
  Filled 2022-03-09: qty 16.68

## 2022-03-09 MED ORDER — SODIUM CHLORIDE 0.9 % IV SOLN
20.0000 mg/m2 | Freq: Once | INTRAVENOUS | Status: AC
  Administered 2022-03-09: 41 mg via INTRAVENOUS
  Filled 2022-03-09: qty 41

## 2022-03-09 MED ORDER — PALONOSETRON HCL INJECTION 0.25 MG/5ML
0.2500 mg | Freq: Once | INTRAVENOUS | Status: AC
  Administered 2022-03-09: 0.25 mg via INTRAVENOUS
  Filled 2022-03-09: qty 5

## 2022-03-09 MED ORDER — PROCHLORPERAZINE MALEATE 10 MG PO TABS
10.0000 mg | ORAL_TABLET | Freq: Four times a day (QID) | ORAL | Status: DC | PRN
  Administered 2022-03-09: 10 mg via ORAL
  Filled 2022-03-09: qty 1

## 2022-03-09 MED ORDER — SODIUM CHLORIDE 0.9% FLUSH
10.0000 mL | INTRAVENOUS | Status: DC | PRN
  Administered 2022-03-09: 10 mL

## 2022-03-09 NOTE — Progress Notes (Signed)
Patient presents for treatment. RN assessment completed along with the following: ? ?Labs/vitals reviewed - Yes, and within treatment parameters.   ?Weight within 10% of previous measurement - Yes ?Informed consent completed and reflects current therapy/intent - Yes, on date 02/22/22             ?Provider progress note reviewed - Yes, today's provider note was reviewed. ?Treatment/Antibody/Supportive plan reviewed - Yes, and there are no adjustments needed for today's treatment. ?S&H and other orders reviewed - Yes, and there are no additional orders identified. ?Previous treatment date reviewed - Yes, and the appropriate amount of time has elapsed between treatments. ?Clinic Hand Off Received from - Gean Birchwood, RN ? ?Patient to proceed with treatment.  ? ?

## 2022-03-09 NOTE — Progress Notes (Signed)
Per MD Stevphen Meuse to proceed with Tx today with output of 55m urine. Continue to monitor urine output and encourage fluids throughout pt's tx today.  ?

## 2022-03-09 NOTE — Progress Notes (Signed)
Patient seen by Lisa Thomas NP today  Vitals are within treatment parameters.  Labs reviewed by Lisa Thomas NP CBC diff reviewed and within treatment parameters, CMP pending.  Per physician team, patient is ready for treatment and there are NO modifications to the treatment plan.  

## 2022-03-09 NOTE — Patient Instructions (Signed)
[facility]   ?Discharge Instructions: ?Thank you for choosing [facility] to provide your oncology and hematology care.  ? ?If you have a lab appointment with the [facility], please go directly to the [facility] and check in at the registration area. ?  ?Wear comfortable clothing and clothing appropriate for easy access to any Portacath or PICC line.  ? ?We strive to give you quality time with your provider. You may need to reschedule your appointment if you arrive late (15 or more minutes).  Arriving late affects you and other patients whose appointments are after yours.  Also, if you miss three or more appointments without notifying the office, you may be dismissed from the clinic at the provider?s discretion.    ?  ?For prescription refill requests, have your pharmacy contact our office and allow 72 hours for refills to be completed.   ? ?Today you received the following chemotherapy and/or immunotherapy agents Trastuzumab-anns, Gemzar, Cisplatin.    ?  ?To help prevent nausea and vomiting after your treatment, we encourage you to take your nausea medication as directed. ? ?BELOW ARE SYMPTOMS THAT SHOULD BE REPORTED IMMEDIATELY: ?*FEVER GREATER THAN 100.4 F (38 ?C) OR HIGHER ?*CHILLS OR SWEATING ?*NAUSEA AND VOMITING THAT IS NOT CONTROLLED WITH YOUR NAUSEA MEDICATION ?*UNUSUAL SHORTNESS OF BREATH ?*UNUSUAL BRUISING OR BLEEDING ?*URINARY PROBLEMS (pain or burning when urinating, or frequent urination) ?*BOWEL PROBLEMS (unusual diarrhea, constipation, pain near the anus) ?TENDERNESS IN MOUTH AND THROAT WITH OR WITHOUT PRESENCE OF ULCERS (sore throat, sores in mouth, or a toothache) ?UNUSUAL RASH, SWELLING OR PAIN  ?UNUSUAL VAGINAL DISCHARGE OR ITCHING  ? ?Items with * indicate a potential emergency and should be followed up as soon as possible or go to the Emergency Department if any problems should occur. ? ?Please show the CHEMOTHERAPY ALERT CARD or IMMUNOTHERAPY  ALERT CARD at check-in to the Emergency Department and triage nurse. ? ?Should you have questions after your visit or need to cancel or reschedule your appointment, please contact [facility]  Dept: [phone]  and follow the prompts.  Office hours are 8:00 a.m. to 4:30 p.m. Monday - Friday. Please note that voicemails left after 4:00 p.m. may not be returned until the following business day.  We are closed weekends and major holidays. You have access to a nurse at all times for urgent questions. Please call the main number to the clinic Dept: [phone] and follow the prompts. ? ? ?For any non-urgent questions, you may also contact your provider using MyChart. We now offer e-Visits for anyone 46 and older to request care online for non-urgent symptoms. For details visit [email or web address]. ?  ?Also download the MyChart app! Go to the app store, search "MyChart", open the app, select , and log in with your MyChart username and password. ? ?Due to Covid, a mask is required upon entering the hospital/clinic. If you do not have a mask, one will be given to you upon arrival. For doctor visits, patients may have 1 support person aged 35 or older with them. For treatment visits, patients cannot have anyone with them due to current Covid guidelines and our immunocompromised population ? ?Trastuzumab injection for infusion ?What is this medication? ?TRASTUZUMAB (tras TOO zoo mab) is a monoclonal antibody. It is used to treat breast cancer and stomach cancer. ?This medicine may be used for other purposes; ask your health care provider or pharmacist if you have questions. ?COMMON  BRAND NAME(S): Herceptin, Belenda Cruise, Ogivri, Ontruzant, Trazimera ?What should I tell my care team before I take this medication? ?They need to know if you have any of these conditions: ?heart disease ?heart failure ?lung or breathing disease, like asthma ?an unusual or allergic reaction to  trastuzumab, benzyl alcohol, or other medications, foods, dyes, or preservatives ?pregnant or trying to get pregnant ?breast-feeding ?How should I use this medication? ?This drug is given as an infusion into a vein. It is administered in a hospital or clinic by a specially trained health care professional. ?Talk to your pediatrician regarding the use of this medicine in children. This medicine is not approved for use in children. ?Overdosage: If you think you have taken too much of this medicine contact a poison control center or emergency room at once. ?NOTE: This medicine is only for you. Do not share this medicine with others. ?What if I miss a dose? ?It is important not to miss a dose. Call your doctor or health care professional if you are unable to keep an appointment. ?What may interact with this medication? ?This medicine may interact with the following medications: ?certain types of chemotherapy, such as daunorubicin, doxorubicin, epirubicin, and idarubicin ?This list may not describe all possible interactions. Give your health care provider a list of all the medicines, herbs, non-prescription drugs, or dietary supplements you use. Also tell them if you smoke, drink alcohol, or use illegal drugs. Some items may interact with your medicine. ?What should I watch for while using this medication? ?Visit your doctor for checks on your progress. Report any side effects. Continue your course of treatment even though you feel ill unless your doctor tells you to stop. ?Call your doctor or health care professional for advice if you get a fever, chills or sore throat, or other symptoms of a cold or flu. Do not treat yourself. Try to avoid being around people who are sick. ?You may experience fever, chills and shaking during your first infusion. These effects are usually mild and can be treated with other medicines. Report any side effects during the infusion to your health care professional. Fever and chills usually  do not happen with later infusions. ?Do not become pregnant while taking this medicine or for 7 months after stopping it. Women should inform their doctor if they wish to become pregnant or think they might be pregnant. Women of child-bearing potential will need to have a negative pregnancy test before starting this medicine. There is a potential for serious side effects to an unborn child. Talk to your health care professional or pharmacist for more information. Do not breast-feed an infant while taking this medicine or for 7 months after stopping it. ?Women must use effective birth control with this medicine. ?What side effects may I notice from receiving this medication? ?Side effects that you should report to your doctor or health care professional as soon as possible: ?allergic reactions like skin rash, itching or hives, swelling of the face, lips, or tongue ?chest pain or palpitations ?cough ?dizziness ?feeling faint or lightheaded, falls ?fever ?general ill feeling or flu-like symptoms ?signs of worsening heart failure like breathing problems; swelling in your legs and feet ?unusually weak or tired ?Side effects that usually do not require medical attention (report to your doctor or health care professional if they continue or are bothersome): ?bone pain ?changes in taste ?diarrhea ?joint pain ?nausea/vomiting ?weight loss ?This list may not describe all possible side effects. Call your doctor for medical advice about  side effects. You may report side effects to FDA at 1-800-FDA-1088. ?Where should I keep my medication? ?This drug is given in a hospital or clinic and will not be stored at home. ?NOTE: This sheet is a summary. It may not cover all possible information. If you have questions about this medicine, talk to your doctor, pharmacist, or health care provider. ?? 2023 Elsevier/Gold Standard (2016-11-14 00:00:00) ? ?Gemcitabine injection ?What is this medication? ?GEMCITABINE (jem SYE ta been) is a  chemotherapy drug. This medicine is used to treat many types of cancer like breast cancer, lung cancer, pancreatic cancer, and ovarian cancer. ?This medicine may be used for other purposes; ask your health care

## 2022-03-09 NOTE — Progress Notes (Signed)
Pt complaining of nausea.Ned Card, NP notified.  Ok to give Compazine '10mg'$  ? ?

## 2022-03-09 NOTE — Progress Notes (Signed)
?[facility] ?OFFICE PROGRESS NOTE ? ? 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 ?INTERVAL HISTORY:  ? 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  He completed cycle 1 gemcitabine/cisplatin/trastuzumab 02/22/2022.  He has mild intermittent nausea which predated the chemotherapy.  No mouth sores.  No diarrhea.  No fever or rash.  Overall good appetite. ? ?Objective: ? ?Vital signs in last 24 hours: ? ?Blood pressure 110/81, pulse 94, temperature 98.2 ?F (36.8 ?C), temperature source Oral, resp. rate 18, height 5' 10" (1.778 m), weight 174 lb 6.4 oz (79.1 kg), SpO2 100 %. ?  ? ?HEENT: Mild white coating over tongue.  No buccal thrush. ?Resp: Lungs clear bilaterally. ?Cardio: Regular rate and rhythm. ?GI: Abdomen soft and nontender.  No hepatosplenomegaly.  No mass. ?Vascular: No leg edema. ?Neuro: Alert, question mild confusion.  Follows commands.  Motor exam intact in the upper and lower extremities. ?Port-A-Cath without erythema. ? ?Lab Results: ? ?Lab Results  ?Component Value Date  ? WBC 3.6 (L) 03/09/2022  ? HGB 11.0 (L) 03/09/2022  ? HCT 34.3 (L) 03/09/2022  ? MCV 87.3 03/09/2022  ? PLT 237 03/09/2022  ? NEUTROABS 1.8 03/09/2022  ? ? ?Imaging: ? ?No results found. ? ?Medications: I have reviewed the patient's current medications. ? ?Assessment/Plan: ?Poorly differentiated adenocarcinoma involving the liver ?Abdominal ultrasound 08/05/2021-indeterminate 69m solid mass in the right hepatic lobe.   ?MRI of the liver 09/06/2021-multifocal rim-enhancing lesions throughout both lobes of the liver.  Index lesion within the lateral dome of right hepatic lobe measures 1.1 cm, segment 4A lesion measures 1.6 x 1.4 cm, segment 2 lesion measures 0.8 x 0.7 cm, posteromedial margin of the right hepatic lobe subcapsular lesion measures 2.5 x 2.0 cm ?09/16/2021 CT angio neck-9 mm right upper lobe nodule ?Biopsy of a right liver mass on 09/23/2021.  Pathology shows poorly differentiated adenocarcinoma  positive for cytokeratin 7, CDX2 and cytokeratin 20 and negative for TTF-1, PSA and prostein.  Differential diagnoses include pancreatobiliary and less likely upper GI. ?HER2 positive by FISH ?PD-L1 combined positive score 0% ?MSS equivocal, tumor mutation burden 0, ERBB2 amplification equivocal ?09/29/2021 CA 19-9 81, CEA 33 ?PET scan 10/10/2021-solitary 10 mm right upper lobe pulmonary nodule with minimal FDG uptake.  7.5 mm left internal mammary lymph node hypermetabolic with SUV max 40.62  Numerous hepatic lesions.  Periportal lymphadenopathy SUV max 8.18.  2 adjacent lesions noted in the mesentery in the right mid abdomen with somewhat irregular margins, SUV 8.09.  No hypermetabolic colonic lesion identified to suggest a primary colon cancer.  No pancreatic lesion.  No gastric lesion.  No retroperitoneal lymphadenopathy. ?Upper endoscopy 10/13/2021-no mass, H. pylori gastritis on gastric biopsy ?Cycle 1 FOLFOX 10/19/2021 ?Cycle 2 FOLFOX 11/02/2021, Emend added, oxaliplatin dose reduced ?Cycle 3 FOLFOX 11/16/2021 ?Cycle 4 FOLFOX 11/30/2021 ?CT abdomen/pelvis with contrast on 12/05/2021-stable exam with bilobar hepatic masses, findings suggestive of metastatic gallbladder cancer. ?CT 01/25/2022-new and enlarging hepatic metastases, abnormal masslike appearance at the gallbladder and porta hepatis, stable left internal mammary node, increased left supraclavicular node, increased size of right upper lobe nodule ?Cycle 1 gemcitabine/cisplatin and trastuzumab for 02/22/2022 ?Cycle 2 gemcitabine/cisplatin and trastuzumab 03/09/2022 ?Hospital admission 09/16/2021 - 09/19/2021 with gait disturbance-multifocal embolic stroke on MRI 137/04/2830 echo with interatrial shunt, transcranial Doppler with bubble study indicative of a medium size right to left shunt, lower extremity Doppler studies negative for DVT ?Hospital admission 09/20/2021 - 09/22/2021 with chest pain-CT coronary with no acute findings.    ?Diabetes ?Hypertension ?BPH ?Chronic kidney  disease ?Brain CT 11/18/2021-new "lesion" in the left cerebellum when compared to brain MRI 09/16/2021.  Appearance of rapid development suspicious for a subacute infarct.  Metastatic focus not excluded.  Follow-up brain MRI with contrast recommended in the next 4 to 6 weeks. ?[facility] admission 01/11/6009-XNATFTDD embolic CVAs, Heparin then apixaban anticoagulation ?10.  Klebsiella bacteremia 12/05/2021-no apparent source for infection ?11.  Thrombocytopenia secondary to chemotherapy and bacteremia ?12.  Left peroneal DVT 12/05/2021 ?13.  Hospital admission 01/02/2541-HCWCB embolic stroke ?MRI brain 01/23/2022-multiple, mostly punctate acute infarcts in the right greater than left cerebral hemisphere and left cerebellum with a larger area of infarction in the posterior right frontal lobe, subacute infarct in the left cerebellar hemisphere ?Lower extremity Dopplers 01/23/2022-chronic left peroneal DVT ?Anticoagulation changed to Lovenox ?  ?  ? 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  He has completed 1 cycle of gemcitabine/cisplatin and trastuzumab.  Overall seems to have tolerated well.  Plan to proceed with cycle 2 today as scheduled. ? ?CBC from today reviewed.  Counts adequate to proceed with treatment. ? ?His wife had questions regarding continuation of Lovenox and aspirin.  He will continue both for now, she will contact neurology. ? ?He will return for lab, follow-up, cycle 3 gemcitabine/cisplatin and trastuzumab in 2 weeks.  We are available to see him sooner if needed. ? ? ? ?Ned Card ANP/GNP-BC  ? ?03/09/2022  ?8:56 AM ? ? ? ? ? ? ? ?

## 2022-03-13 ENCOUNTER — Ambulatory Visit (INDEPENDENT_AMBULATORY_CARE_PROVIDER_SITE_OTHER): Payer: Medicare HMO | Admitting: Family Medicine

## 2022-03-13 ENCOUNTER — Encounter: Payer: Self-pay | Admitting: Family Medicine

## 2022-03-13 VITALS — BP 130/84 | HR 75 | Temp 97.4°F | Ht 70.0 in | Wt 180.3 lb

## 2022-03-13 DIAGNOSIS — C801 Malignant (primary) neoplasm, unspecified: Secondary | ICD-10-CM

## 2022-03-13 DIAGNOSIS — I63433 Cerebral infarction due to embolism of bilateral posterior cerebral arteries: Secondary | ICD-10-CM

## 2022-03-13 DIAGNOSIS — Z8673 Personal history of transient ischemic attack (TIA), and cerebral infarction without residual deficits: Secondary | ICD-10-CM | POA: Diagnosis not present

## 2022-03-13 DIAGNOSIS — E1165 Type 2 diabetes mellitus with hyperglycemia: Secondary | ICD-10-CM | POA: Diagnosis not present

## 2022-03-13 DIAGNOSIS — K5900 Constipation, unspecified: Secondary | ICD-10-CM

## 2022-03-13 DIAGNOSIS — Z794 Long term (current) use of insulin: Secondary | ICD-10-CM | POA: Diagnosis not present

## 2022-03-13 MED ORDER — METFORMIN HCL 500 MG PO TABS
500.0000 mg | ORAL_TABLET | Freq: Two times a day (BID) | ORAL | 3 refills | Status: DC
Start: 2022-03-13 — End: 2022-05-09

## 2022-03-13 MED ORDER — TOUJEO MAX SOLOSTAR 300 UNIT/ML ~~LOC~~ SOPN: 16.0000 [IU] | PEN_INJECTOR | Freq: Every day | SUBCUTANEOUS | 1 refills | Status: AC

## 2022-03-13 NOTE — Progress Notes (Signed)
? ?Established Patient Office Visit ? ?Subjective   ?Patient ID: Robert Warner, male    DOB: 1941/05/13  Age: 81 y.o. MRN: [id number] ? ?Chief Complaint  ?Patient presents with  ? Hospitalization Follow-up  ? ? ?HPI ? ? ?Robert Warner is here accompanied by wife for medical follow-up.  We have not seen him since last hospitalization in March for CVA -presented with left-sided weakness.  Imaging revealed embolic shower with largest right MCA territory.  He has abdominal malignancy with unknown primary which is metastatic adenocarcinoma.  Hypercoagulable state.  Was on Eliquis prior to admission and transition to Lovenox 120 mg daily which she is on currently. ? ?They had several home health services including OT and PT and currently have home health aide coming out to assist with showers.  His major complaint this time is ongoing abdominal pain.  Sometimes 8 out of 10 in severity.  Currently on oxycodone per oncology.  Pain is increased today and especially past couple days.  Has had some recent constipation improved with MiraLAX and Senokot. ? ?Using Glucerna supplement.  They have Dexcom monitor.  Recent A1c 8.0%.  This was down from 11.9 prior to starting insulin.  Currently on low-dose Toujeo and metformin.  Needs refills of both.  Recent GFR 57.  Wife states they have only had 1 blood sugar in the past 30 days over 200. ? ?Past Medical History:  ?Diagnosis Date  ? Arthritis   ? Cancer [facility])   ? Cataract   ? Diabetes mellitus   ? Hyperlipidemia   ? Stroke [facility])   ? ?Past Surgical History:  ?Procedure Laterality Date  ? APPENDECTOMY  1968  ? CATARACT EXTRACTION Bilateral 07/2000  ? states 2 weeks ago (first of sept) OD due in Dec   ? COLONOSCOPY  2004,2009  ? Negative, Dr. Sharlett Iles   ? IR IMAGING GUIDED PORT INSERTION  10/14/2021  ? KIDNEY STONE SURGERY  10/2018  ? PROSTATE BIOPSY  2006  ? Dr.Sigmund Tannebaum  ? ? reports that he quit smoking about 55 years ago. His smoking use included cigarettes. He has a 5.00  pack-year smoking history. He has never used smokeless tobacco. He reports that he does not currently use alcohol after a past usage of about 2.0 standard drinks per week. He reports that he does not use drugs. ?family history includes Breast cancer in his paternal aunt; Coronary artery disease in his maternal grandmother, maternal uncle, mother, and paternal aunt; Diabetes in his maternal aunt and maternal uncle; Hypertension in his mother; Pancreatic cancer in his brother; Stroke in his paternal grandmother and sister; Stroke (age of onset: 38) in his father. ?No Known Allergies ?Marland Kitchen ?Review of Systems  ?Constitutional:  Positive for weight loss. Negative for fever.  ?Respiratory:  Negative for cough and shortness of breath.   ?Cardiovascular:  Negative for chest pain.  ?Gastrointestinal:  Positive for abdominal pain, constipation and nausea. Negative for blood in stool, melena and vomiting.  ?Genitourinary:  Negative for dysuria.  ?Psychiatric/Behavioral:  Negative for depression.   ? ?  ?Objective:  ?  ? ?BP 130/84 (BP Location: Left Arm, Patient Position: Sitting, Cuff Size: Normal)   Pulse 75   Temp (!) 97.4 ?F (36.3 ?C) (Oral)   Ht '5\' 10"'$  (1.778 m)   Wt 180 lb 4.8 oz (81.8 kg)   SpO2 92%   BMI 25.87 kg/m?  ? ? ?Physical Exam ?Vitals reviewed.  ?Cardiovascular:  ?   Rate and Rhythm: Normal rate.  ?Pulmonary:  ?  Effort: Pulmonary effort is normal.  ?   Breath sounds: Normal breath sounds.  ?Abdominal:  ?   Palpations: Abdomen is soft.  ?Musculoskeletal:  ?   Right lower leg: No edema.  ?   Left lower leg: No edema.  ?Neurological:  ?   Mental Status: He is alert.  ? ? ? ?No results found for any visits on 03/13/22. ? ? ? ?The ASCVD Risk score (Arnett DK, et al., 2019) failed to calculate for the following reasons: ?  The 2019 ASCVD risk score is only valid for ages 69 to 54 ?  The patient has a prior MI or stroke diagnosis ? ?  ?Assessment & Plan:  ? ?#1 adenocarcinoma of the liver with unknown primary.   Patient currently undergoing chemotherapy per oncology.  Having moderate pain at this time most likely related to his cancer.  Currently on oxycodone per oncology.  He will be in touch with them regarding current poor pain control. ? ?#2 type 2 diabetes.  Improved by recent A1c with 8.0%.  Not aiming for tight control with his overall medical condition and age.  Continue current insulin regimen.  Refilled metformin and Toujeo ? ?#3 intermittent constipation probably related to opioids.  Continue MiraLAX and Senokot which seems to be working fairly well ? 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  Patient currently on Lovenox.  They just got this refilled ? ? ?Return in about 3 months (around 06/13/2022).  ? ? ?[name], MD ? ?

## 2022-03-17 ENCOUNTER — Other Ambulatory Visit: Payer: Self-pay | Admitting: Oncology

## 2022-03-21 ENCOUNTER — Encounter: Payer: Self-pay | Admitting: Licensed Clinical Social Worker

## 2022-03-21 NOTE — Progress Notes (Signed)
[facility] CSW Progress Note ? ?Clinical Social Worker contacted caregiver by phone to follow-up on last conversation. [name] stated she id doing well and she and the patient are "managing our new normal".  Patient is still receiving PT/OT and Aide home health services but [name] stated she is still mildly overwhelmed by the patient's care needs and the household responsibility.  [name] stated she would really benefit from someone assisting with light meal preparation and managing the patient's medications.  CSW recommended [name] [facility] with [facility] and find out if the patient meets criteria for home services assistance.  Ms. Lesser stated she did no think he did since he was in the reserve, but she would ask.  CSW recommended a private duty caregiver, to which [name] was amenable.  CSW stated I would send her a list of private duty care givers, and we also discussed different companies which delivered prepared meals. CSW sent email with information to [email or web address].   ? ? ? ?Robert Warner , LCSW ?

## 2022-03-24 ENCOUNTER — Other Ambulatory Visit: Payer: Self-pay | Admitting: Oncology

## 2022-03-24 ENCOUNTER — Inpatient Hospital Stay: Payer: Medicare HMO

## 2022-03-24 ENCOUNTER — Inpatient Hospital Stay: Payer: Medicare HMO | Attending: Oncology

## 2022-03-24 ENCOUNTER — Encounter: Payer: Self-pay | Admitting: *Deleted

## 2022-03-24 ENCOUNTER — Inpatient Hospital Stay (HOSPITAL_BASED_OUTPATIENT_CLINIC_OR_DEPARTMENT_OTHER): Payer: Medicare HMO | Admitting: Oncology

## 2022-03-24 VITALS — BP 121/82 | HR 80 | Temp 98.1°F | Resp 18 | Ht 70.0 in | Wt 173.6 lb

## 2022-03-24 DIAGNOSIS — I129 Hypertensive chronic kidney disease with stage 1 through stage 4 chronic kidney disease, or unspecified chronic kidney disease: Secondary | ICD-10-CM | POA: Diagnosis not present

## 2022-03-24 DIAGNOSIS — D6959 Other secondary thrombocytopenia: Secondary | ICD-10-CM | POA: Diagnosis not present

## 2022-03-24 DIAGNOSIS — N400 Enlarged prostate without lower urinary tract symptoms: Secondary | ICD-10-CM | POA: Insufficient documentation

## 2022-03-24 DIAGNOSIS — R1010 Upper abdominal pain, unspecified: Secondary | ICD-10-CM | POA: Diagnosis not present

## 2022-03-24 DIAGNOSIS — Z95828 Presence of other vascular implants and grafts: Secondary | ICD-10-CM

## 2022-03-24 DIAGNOSIS — C787 Secondary malignant neoplasm of liver and intrahepatic bile duct: Secondary | ICD-10-CM | POA: Insufficient documentation

## 2022-03-24 DIAGNOSIS — C801 Malignant (primary) neoplasm, unspecified: Secondary | ICD-10-CM

## 2022-03-24 DIAGNOSIS — E1122 Type 2 diabetes mellitus with diabetic chronic kidney disease: Secondary | ICD-10-CM | POA: Diagnosis not present

## 2022-03-24 DIAGNOSIS — R970 Elevated carcinoembryonic antigen [CEA]: Secondary | ICD-10-CM | POA: Diagnosis not present

## 2022-03-24 DIAGNOSIS — Z452 Encounter for adjustment and management of vascular access device: Secondary | ICD-10-CM | POA: Diagnosis not present

## 2022-03-24 DIAGNOSIS — R7881 Bacteremia: Secondary | ICD-10-CM | POA: Diagnosis not present

## 2022-03-24 DIAGNOSIS — B9681 Helicobacter pylori [H. pylori] as the cause of diseases classified elsewhere: Secondary | ICD-10-CM | POA: Diagnosis not present

## 2022-03-24 DIAGNOSIS — R590 Localized enlarged lymph nodes: Secondary | ICD-10-CM | POA: Insufficient documentation

## 2022-03-24 DIAGNOSIS — Z7901 Long term (current) use of anticoagulants: Secondary | ICD-10-CM | POA: Diagnosis not present

## 2022-03-24 DIAGNOSIS — B961 Klebsiella pneumoniae [K. pneumoniae] as the cause of diseases classified elsewhere: Secondary | ICD-10-CM | POA: Insufficient documentation

## 2022-03-24 DIAGNOSIS — R911 Solitary pulmonary nodule: Secondary | ICD-10-CM | POA: Insufficient documentation

## 2022-03-24 DIAGNOSIS — Z5112 Encounter for antineoplastic immunotherapy: Secondary | ICD-10-CM | POA: Diagnosis not present

## 2022-03-24 DIAGNOSIS — N189 Chronic kidney disease, unspecified: Secondary | ICD-10-CM | POA: Insufficient documentation

## 2022-03-24 DIAGNOSIS — I679 Cerebrovascular disease, unspecified: Secondary | ICD-10-CM | POA: Diagnosis not present

## 2022-03-24 DIAGNOSIS — Z5111 Encounter for antineoplastic chemotherapy: Secondary | ICD-10-CM | POA: Insufficient documentation

## 2022-03-24 DIAGNOSIS — K2960 Other gastritis without bleeding: Secondary | ICD-10-CM | POA: Diagnosis not present

## 2022-03-24 DIAGNOSIS — Z86718 Personal history of other venous thrombosis and embolism: Secondary | ICD-10-CM | POA: Diagnosis not present

## 2022-03-24 LAB — CMP (CANCER CENTER ONLY)
ALT: 27 U/L (ref 0–44)
AST: 38 U/L (ref 15–41)
Albumin: 3.6 g/dL (ref 3.5–5.0)
Alkaline Phosphatase: 144 U/L — ABNORMAL HIGH (ref 38–126)
Anion gap: 9 (ref 5–15)
BUN: 19 mg/dL (ref 8–23)
CO2: 27 mmol/L (ref 22–32)
Calcium: 9.5 mg/dL (ref 8.9–10.3)
Chloride: 100 mmol/L (ref 98–111)
Creatinine: 1.12 mg/dL (ref 0.61–1.24)
GFR, Estimated: >60 mL/min (ref >60)
Glucose, Bld: 115 mg/dL — ABNORMAL HIGH (ref 70–99)
Potassium: 4.3 mmol/L (ref 3.5–5.1)
Sodium: 136 mmol/L (ref 135–145)
Total Bilirubin: 0.6 mg/dL (ref 0.3–1.2)
Total Protein: 7.2 g/dL (ref 6.5–8.1)

## 2022-03-24 LAB — CBC WITH DIFFERENTIAL (CANCER CENTER ONLY)
Abs Immature Granulocytes: 0.01 10*3/uL (ref 0.00–0.07)
Basophils Absolute: 0 10*3/uL (ref 0.0–0.1)
Basophils Relative: 1 %
Eosinophils Absolute: 0.1 10*3/uL (ref 0.0–0.5)
Eosinophils Relative: 1 %
HCT: 32.8 % — ABNORMAL LOW (ref 39.0–52.0)
Hemoglobin: 10.7 g/dL — ABNORMAL LOW (ref 13.0–17.0)
Immature Granulocytes: 0 %
Lymphocytes Relative: 29 %
Lymphs Abs: 1.2 10*3/uL (ref 0.7–4.0)
MCH: 28.5 pg (ref 26.0–34.0)
MCHC: 32.6 g/dL (ref 30.0–36.0)
MCV: 87.2 fL (ref 80.0–100.0)
Monocytes Absolute: 0.5 10*3/uL (ref 0.1–1.0)
Monocytes Relative: 11 %
Neutro Abs: 2.3 10*3/uL (ref 1.7–7.7)
Neutrophils Relative %: 58 %
Platelet Count: 184 10*3/uL (ref 150–400)
RBC: 3.76 MIL/uL — ABNORMAL LOW (ref 4.22–5.81)
RDW: 13.5 % (ref 11.5–15.5)
WBC Count: 4 10*3/uL (ref 4.0–10.5)
nRBC: 0 % (ref 0.0–0.2)

## 2022-03-24 LAB — MAGNESIUM: Magnesium: 1.7 mg/dL (ref 1.7–2.4)

## 2022-03-24 MED ORDER — SODIUM CHLORIDE 0.9 % IV SOLN
150.0000 mg | Freq: Once | INTRAVENOUS | Status: AC
  Administered 2022-03-24: 150 mg via INTRAVENOUS
  Filled 2022-03-24: qty 5

## 2022-03-24 MED ORDER — MAGNESIUM SULFATE 2 GM/50ML IV SOLN
2.0000 g | Freq: Once | INTRAVENOUS | Status: AC
  Administered 2022-03-24: 2 g via INTRAVENOUS
  Filled 2022-03-24: qty 50

## 2022-03-24 MED ORDER — SODIUM CHLORIDE 0.9 % IV SOLN
20.0000 mg/m2 | Freq: Once | INTRAVENOUS | Status: AC
  Administered 2022-03-24: 41 mg via INTRAVENOUS
  Filled 2022-03-24: qty 41

## 2022-03-24 MED ORDER — SODIUM CHLORIDE 0.9% FLUSH
10.0000 mL | INTRAVENOUS | Status: DC | PRN
  Administered 2022-03-24: 10 mL via INTRAVENOUS

## 2022-03-24 MED ORDER — SODIUM CHLORIDE 0.9% FLUSH
10.0000 mL | INTRAVENOUS | Status: DC | PRN
  Administered 2022-03-24: 10 mL

## 2022-03-24 MED ORDER — TRASTUZUMAB-ANNS CHEMO 150 MG IV SOLR
150.0000 mg | Freq: Once | INTRAVENOUS | Status: AC
  Administered 2022-03-24: 150 mg via INTRAVENOUS
  Filled 2022-03-24: qty 7.14

## 2022-03-24 MED ORDER — HEPARIN SOD (PORK) LOCK FLUSH 100 UNIT/ML IV SOLN
500.0000 [IU] | Freq: Once | INTRAVENOUS | Status: AC | PRN
  Administered 2022-03-24: 500 [IU]

## 2022-03-24 MED ORDER — SODIUM CHLORIDE 0.9 % IV SOLN
10.0000 mg | Freq: Once | INTRAVENOUS | Status: AC
  Administered 2022-03-24: 10 mg via INTRAVENOUS
  Filled 2022-03-24: qty 1

## 2022-03-24 MED ORDER — OXYCODONE-ACETAMINOPHEN 5-325 MG PO TABS: 1.0000 | ORAL_TABLET | ORAL | 0 refills | Status: DC | PRN

## 2022-03-24 MED ORDER — POTASSIUM CHLORIDE IN NACL 20-0.9 MEQ/L-% IV SOLN
Freq: Once | INTRAVENOUS | Status: AC
  Filled 2022-03-24: qty 1000

## 2022-03-24 MED ORDER — ACETAMINOPHEN 325 MG PO TABS
650.0000 mg | ORAL_TABLET | Freq: Once | ORAL | Status: AC
  Administered 2022-03-24: 650 mg via ORAL
  Filled 2022-03-24: qty 2

## 2022-03-24 MED ORDER — SODIUM CHLORIDE 0.9 % IV SOLN
800.0000 mg/m2 | Freq: Once | INTRAVENOUS | Status: AC
  Administered 2022-03-24: 1634 mg via INTRAVENOUS
  Filled 2022-03-24: qty 21.04

## 2022-03-24 MED ORDER — PALONOSETRON HCL INJECTION 0.25 MG/5ML
0.2500 mg | Freq: Once | INTRAVENOUS | Status: AC
  Administered 2022-03-24: 0.25 mg via INTRAVENOUS
  Filled 2022-03-24: qty 5

## 2022-03-24 MED ORDER — DIPHENHYDRAMINE HCL 25 MG PO CAPS
25.0000 mg | ORAL_CAPSULE | Freq: Once | ORAL | Status: AC
  Administered 2022-03-24: 25 mg via ORAL
  Filled 2022-03-24: qty 1

## 2022-03-24 MED ORDER — SODIUM CHLORIDE 0.9 % IV SOLN: Freq: Once | INTRAVENOUS | Status: AC

## 2022-03-24 NOTE — Patient Instructions (Signed)

## 2022-03-24 NOTE — Progress Notes (Signed)
?Robert Warner ?OFFICE PROGRESS NOTE ? ? ?Diagnosis: Metastatic adenocarcinoma ? ?INTERVAL HISTORY:  ? 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  No nausea/vomiting, fever, rash, or neuropathy symptoms.  He complains of persistent upper abdominal pain that is worse after eating.  He is ambulatory in the home. ? ?Objective: ? ?Vital signs in last 24 hours: ? ?Blood pressure 121/82, pulse 80, temperature 98.1 ?F (36.7 ?C), resp. rate 18, height 5' 10"  (1.778 m), weight 173 lb 9.6 oz (78.7 kg), SpO2 98 %. ?  ? ?HEENT: No thrush or ulcers ?Resp: Lungs clear bilaterally ?Cardio: Regular rate and rhythm ?GI: No hepatosplenomegaly, tender in mid upper abdomen, small areas of induration at the abdominal wall injection sites ?Vascular: No leg edema ?Neuro: Alert, follows commands, ambulates to the exam table ?  ? ?Portacath/PICC-without erythema ? ?Lab Results: ? ?Lab Results  ?Component Value Date  ? WBC 4.0 03/24/2022  ? HGB 10.7 (L) 03/24/2022  ? HCT 32.8 (L) 03/24/2022  ? MCV 87.2 03/24/2022  ? PLT 184 03/24/2022  ? NEUTROABS 2.3 03/24/2022  ? ? ?CMP  ?Lab Results  ?Component Value Date  ? NA 135 03/09/2022  ? K 4.1 03/09/2022  ? CL 100 03/09/2022  ? CO2 27 03/09/2022  ? GLUCOSE 116 (H) 03/09/2022  ? BUN 22 03/09/2022  ? CREATININE 1.26 (H) 03/09/2022  ? CALCIUM 10.0 03/09/2022  ? PROT 7.4 03/09/2022  ? ALBUMIN 3.6 03/09/2022  ? AST 36 03/09/2022  ? ALT 29 03/09/2022  ? ALKPHOS 158 (H) 03/09/2022  ? BILITOT 0.6 03/09/2022  ? GFRNONAA 57 (L) 03/09/2022  ? GFRAA 95 06/15/2008  ? ? ?Lab Results  ?Component Value Date  ? CEA 196.75 (H) 02/22/2022  ? YPP509 339 (H) 02/22/2022  ? ? ? ?Medications: I have reviewed the patient's current medications. ? ? ?Assessment/Plan: ?Poorly differentiated adenocarcinoma involving the liver ?Abdominal ultrasound 08/05/2021-indeterminate 76m solid mass in the right hepatic lobe.   ?MRI of the liver 09/06/2021-multifocal rim-enhancing  lesions throughout both lobes of the liver.  Index lesion within the lateral dome of right hepatic lobe measures 1.1 cm, segment 4A lesion measures 1.6 x 1.4 cm, segment 2 lesion measures 0.8 x 0.7 cm, posteromedial margin of the right hepatic lobe subcapsular lesion measures 2.5 x 2.0 cm ?09/16/2021 CT angio neck-9 mm right upper lobe nodule ?Biopsy of a right liver mass on 09/23/2021.  Pathology shows poorly differentiated adenocarcinoma positive for cytokeratin 7, CDX2 and cytokeratin 20 and negative for TTF-1, PSA and prostein.  Differential diagnoses include pancreatobiliary and less likely upper GI. ?HER2 positive by FISH ?PD-L1 combined positive score 0% ?MSS equivocal, tumor mutation burden 0, ERBB2 amplification equivocal ?09/29/2021 CA 19-9 81, CEA 33 ?PET scan 10/10/2021-solitary 10 mm right upper lobe pulmonary nodule with minimal FDG uptake.  7.5 mm left internal mammary lymph node hypermetabolic with SUV max 43.26  Numerous hepatic lesions.  Periportal lymphadenopathy SUV max 8.18.  2 adjacent lesions noted in the mesentery in the right mid abdomen with somewhat irregular margins, SUV 8.09.  No hypermetabolic colonic lesion identified to suggest a primary colon cancer.  No pancreatic lesion.  No gastric lesion.  No retroperitoneal lymphadenopathy. ?Upper endoscopy 10/13/2021-no mass, H. pylori gastritis on gastric biopsy ?Cycle 1 FOLFOX 10/19/2021 ?Cycle 2 FOLFOX 11/02/2021, Emend added, oxaliplatin dose reduced ?Cycle 3 FOLFOX 11/16/2021 ?Cycle 4 FOLFOX 11/30/2021 ?CT abdomen/pelvis with contrast on 12/05/2021-stable exam with bilobar hepatic masses, findings suggestive of metastatic gallbladder cancer. ?CT 01/25/2022-new and enlarging hepatic  metastases, abnormal masslike appearance at the gallbladder and porta hepatis, stable left internal mammary node, increased left supraclavicular node, increased size of right upper lobe nodule ?Guardant360 02/17/2022-MSI high not detected,PTEN, RB1, GATA3  alterations ?Cycle 1 gemcitabine/cisplatin and trastuzumab for 02/22/2022 ?Cycle 2 gemcitabine/cisplatin and trastuzumab 03/09/2022 ?Cycle 3 gemcitabine/cisplatin and trastuzumab 03/24/2022 ?Hospital admission 09/16/2021 - 09/19/2021 with gait disturbance-multifocal embolic stroke on MRI 32/02/4009, echo with interatrial shunt, transcranial Doppler with bubble study indicative of a medium size right to left shunt, lower extremity Doppler studies negative for DVT ?Hospital admission 09/20/2021 - 09/22/2021 with chest pain-CT coronary with no acute findings.   ?Diabetes ?Hypertension ?BPH ?Chronic kidney disease ?Brain CT 11/18/2021-new "lesion" in the left cerebellum when compared to brain MRI 09/16/2021.  Appearance of rapid development suspicious for a subacute infarct.  Metastatic focus not excluded.  Follow-up brain MRI with contrast recommended in the next 4 to 6 weeks. ?[facility] admission 2/72/5366-[id number] embolic CVAs, Heparin then apixaban anticoagulation ?10.  Klebsiella bacteremia 12/05/2021-no apparent source for infection ?11.  Thrombocytopenia secondary to chemotherapy and bacteremia ?12.  Left peroneal DVT 12/05/2021 ?13.  Hospital admission 5/95/6387-FIEPP embolic stroke ?MRI brain 01/23/2022-multiple, mostly punctate acute infarcts in the right greater than left cerebral hemisphere and left cerebellum with a larger area of infarction in the posterior right frontal lobe, subacute infarct in the left cerebellar hemisphere ?Lower extremity Dopplers 01/23/2022-chronic left peroneal DVT ?Anticoagulation changed to Lovenox ?  ?  ? ? 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  He has tolerated the treatment well.  His overall clinical status appears unchanged.  He continues to have upper abdominal pain.  He will continue oxycodone and tramadol as needed for pain. ? ?[name] will complete another cycle of treatment today.  He will return for an office visit and  chemotherapy in 2 weeks.  We will check a CEA and CA 19-9 when he returns in 2 weeks. ?[name], MD ? ?03/24/2022  ?8:36 AM ? ? ?

## 2022-03-24 NOTE — Patient Instructions (Signed)
[facility]  Discharge Instructions: ?Thank you for choosing [facility] to provide your oncology and hematology care.  ? ?If you have a lab appointment with the [facility], please go directly to the [facility] and check in at the registration area. ?  ?Wear comfortable clothing and clothing appropriate for easy access to any Portacath or PICC line.  ? ?We strive to give you quality time with your provider. You may need to reschedule your appointment if you arrive late (15 or more minutes).  Arriving late affects you and other patients whose appointments are after yours.  Also, if you miss three or more appointments without notifying the office, you may be dismissed from the clinic at the provider?s discretion.    ?  ?For prescription refill requests, have your pharmacy contact our office and allow 72 hours for refills to be completed.   ? ?Today you received the following chemotherapy and/or immunotherapy agents Kanjinti, Gemzar and Cisplatin     ?  ?To help prevent nausea and vomiting after your treatment, we encourage you to take your nausea medication as directed. ? ?BELOW ARE SYMPTOMS THAT SHOULD BE REPORTED IMMEDIATELY: ?*FEVER GREATER THAN 100.4 F (38 ?C) OR HIGHER ?*CHILLS OR SWEATING ?*NAUSEA AND VOMITING THAT IS NOT CONTROLLED WITH YOUR NAUSEA MEDICATION ?*UNUSUAL SHORTNESS OF BREATH ?*UNUSUAL BRUISING OR BLEEDING ?*URINARY PROBLEMS (pain or burning when urinating, or frequent urination) ?*BOWEL PROBLEMS (unusual diarrhea, constipation, pain near the anus) ?TENDERNESS IN MOUTH AND THROAT WITH OR WITHOUT PRESENCE OF ULCERS (sore throat, sores in mouth, or a toothache) ?UNUSUAL RASH, SWELLING OR PAIN  ?UNUSUAL VAGINAL DISCHARGE OR ITCHING  ? ?Items with * indicate a potential emergency and should be followed up as soon as possible or go to the Emergency Department if any problems should occur. ? ?Please show the CHEMOTHERAPY ALERT CARD or IMMUNOTHERAPY ALERT  CARD at check-in to the Emergency Department and triage nurse. ? ?Should you have questions after your visit or need to cancel or reschedule your appointment, please contact [facility]  Dept: [phone]  and follow the prompts.  Office hours are 8:00 a.m. to 4:30 p.m. Monday - Friday. Please note that voicemails left after 4:00 p.m. may not be returned until the following business day.  We are closed weekends and major holidays. You have access to a nurse at all times for urgent questions. Please call the main number to the clinic Dept: [phone] and follow the prompts. ? ? ?For any non-urgent questions, you may also contact your provider using MyChart. We now offer e-Visits for anyone 64 and older to request care online for non-urgent symptoms. For details visit [email or web address]. ?  ?Also download the MyChart app! Go to the app store, search "MyChart", open the app, select [facility], and log in with your MyChart username and password. ? ?Due to Covid, a mask is required upon entering the hospital/clinic. If you do not have a mask, one will be given to you upon arrival. For doctor visits, patients may have 1 support person aged 77 or older with them. For treatment visits, patients cannot have anyone with them due to current Covid guidelines and our immunocompromised population.  ? ?

## 2022-03-24 NOTE — Progress Notes (Signed)
Patient presents for treatment. RN assessment completed along with the following: ? ?Labs/vitals reviewed - Yes, and within treatment parameters.   ?Weight within 10% of previous measurement - Yes ?Informed consent completed and reflects current therapy/intent - Yes, on date 02/22/2022             ?Provider progress note reviewed - Today's provider note is not yet available. I reviewed the most recent oncology provider progress note in chart dated 03/09/2022. ?Treatment/Antibody/Supportive plan reviewed - Yes, and there are no adjustments needed for today's treatment. ?S&H and other orders reviewed - Yes, and there are no additional orders identified. ?Previous treatment date reviewed - Yes, and the appropriate amount of time has elapsed between treatments. ?Clinic Hand Off Received from - Cristy Friedlander, RN ? ?Patient to proceed with treatment.  ? ? ? ?Per MD Benay Spice, ok to proceed with treatment with output of 137m. Per SCristy Friedlander RN per MD SBenay Spice no repeat ECHO needed at this time.  ?

## 2022-03-24 NOTE — Progress Notes (Signed)
Patient seen by Dr. Sherrill today ? ?Vitals are within treatment parameters. ? ?Labs reviewed by Dr. Sherrill and are within treatment parameters. ? ?Per physician team, patient is ready for treatment and there are NO modifications to the treatment plan.  ?

## 2022-03-26 ENCOUNTER — Other Ambulatory Visit: Payer: Self-pay

## 2022-03-26 ENCOUNTER — Emergency Department (HOSPITAL_BASED_OUTPATIENT_CLINIC_OR_DEPARTMENT_OTHER): Payer: Medicare HMO

## 2022-03-26 ENCOUNTER — Encounter (HOSPITAL_BASED_OUTPATIENT_CLINIC_OR_DEPARTMENT_OTHER): Payer: Self-pay | Admitting: Obstetrics and Gynecology

## 2022-03-26 ENCOUNTER — Emergency Department (HOSPITAL_BASED_OUTPATIENT_CLINIC_OR_DEPARTMENT_OTHER)
Admission: EM | Admit: 2022-03-26 | Discharge: 2022-03-26 | Disposition: A | Discharge status: Home / Self Care | Payer: Medicare HMO | Attending: Emergency Medicine | Admitting: Emergency Medicine

## 2022-03-26 DIAGNOSIS — Z7984 Long term (current) use of oral hypoglycemic drugs: Secondary | ICD-10-CM | POA: Insufficient documentation

## 2022-03-26 DIAGNOSIS — N400 Enlarged prostate without lower urinary tract symptoms: Secondary | ICD-10-CM | POA: Diagnosis not present

## 2022-03-26 DIAGNOSIS — Z7982 Long term (current) use of aspirin: Secondary | ICD-10-CM | POA: Insufficient documentation

## 2022-03-26 DIAGNOSIS — K820 Obstruction of gallbladder: Secondary | ICD-10-CM | POA: Diagnosis not present

## 2022-03-26 DIAGNOSIS — R1084 Generalized abdominal pain: Secondary | ICD-10-CM | POA: Diagnosis not present

## 2022-03-26 DIAGNOSIS — K769 Liver disease, unspecified: Secondary | ICD-10-CM | POA: Diagnosis not present

## 2022-03-26 DIAGNOSIS — R1013 Epigastric pain: Secondary | ICD-10-CM | POA: Diagnosis present

## 2022-03-26 DIAGNOSIS — D649 Anemia, unspecified: Secondary | ICD-10-CM | POA: Diagnosis not present

## 2022-03-26 LAB — COMPREHENSIVE METABOLIC PANEL
ALT: 28 U/L (ref 0–44)
AST: 39 U/L (ref 15–41)
Albumin: 3.4 g/dL — ABNORMAL LOW (ref 3.5–5.0)
Alkaline Phosphatase: 131 U/L — ABNORMAL HIGH (ref 38–126)
Anion gap: 9 (ref 5–15)
BUN: 25 mg/dL — ABNORMAL HIGH (ref 8–23)
CO2: 24 mmol/L (ref 22–32)
Calcium: 8.8 mg/dL — ABNORMAL LOW (ref 8.9–10.3)
Chloride: 101 mmol/L (ref 98–111)
Creatinine, Ser: 1.16 mg/dL (ref 0.61–1.24)
GFR, Estimated: >60 mL/min (ref >60)
Glucose, Bld: 86 mg/dL (ref 70–99)
Potassium: 4.5 mmol/L (ref 3.5–5.1)
Sodium: 134 mmol/L — ABNORMAL LOW (ref 135–145)
Total Bilirubin: 0.5 mg/dL (ref 0.3–1.2)
Total Protein: 6.6 g/dL (ref 6.5–8.1)

## 2022-03-26 LAB — CBC
HCT: 29.8 % — ABNORMAL LOW (ref 39.0–52.0)
Hemoglobin: 9.7 g/dL — ABNORMAL LOW (ref 13.0–17.0)
MCH: 28.7 pg (ref 26.0–34.0)
MCHC: 32.6 g/dL (ref 30.0–36.0)
MCV: 88.2 fL (ref 80.0–100.0)
Platelets: 172 10*3/uL (ref 150–400)
RBC: 3.38 MIL/uL — ABNORMAL LOW (ref 4.22–5.81)
RDW: 13.8 % (ref 11.5–15.5)
WBC: 3.4 10*3/uL — ABNORMAL LOW (ref 4.0–10.5)
nRBC: 0 % (ref 0.0–0.2)

## 2022-03-26 LAB — LIPASE, BLOOD: Lipase: 39 U/L (ref 11–51)

## 2022-03-26 IMAGING — CT CT ABD-PELV W/ CM
2 of 5 series · 15 of 46 positions shown, 17 images · IV contrast (APPLIED)
Comparison: [DATE]

CLINICAL DATA: Abdominal pain, metastatic cancer of unknown primary
* Tracking Code: BO *

EXAM:
CT ABDOMEN AND PELVIS WITH CONTRAST
TECHNIQUE: Multidetector CT imaging of the abdomen and pelvis was performed
using the standard protocol following bolus administration of
intravenous contrast.

[Series 3: abd pel w · axial · 0.74mm/px · z∈[+847,+1272]mm · 12 of 96 slices shown, 14 images]
[im 6/96  soft-tissue]
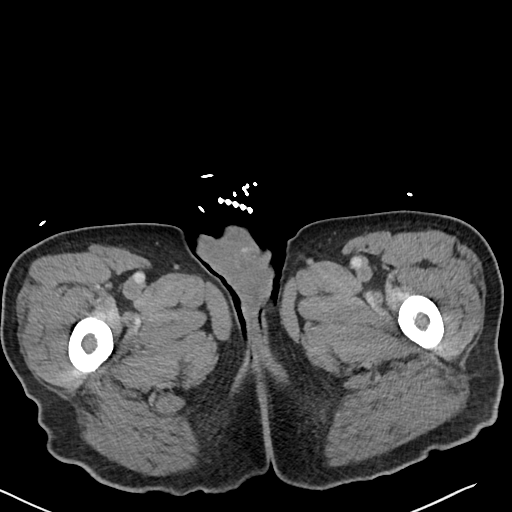
[im 6/96  bone]
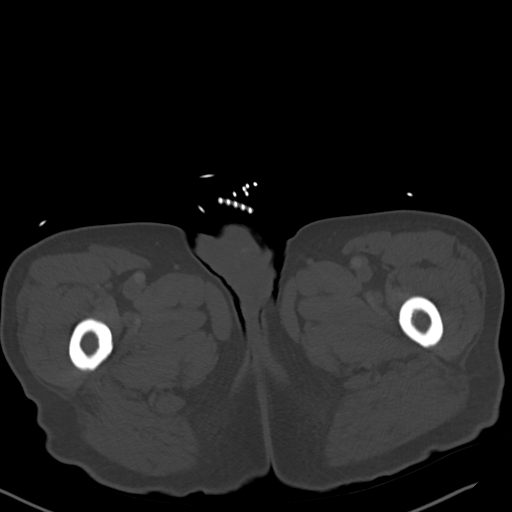
[im 16/96  soft-tissue]
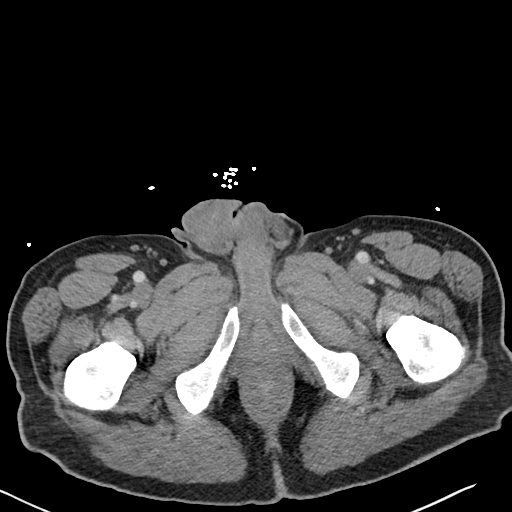
[im 21/96  soft-tissue]
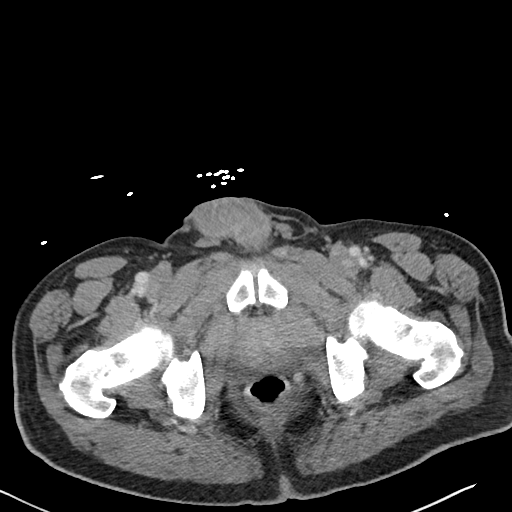
[im 31/96  soft-tissue]
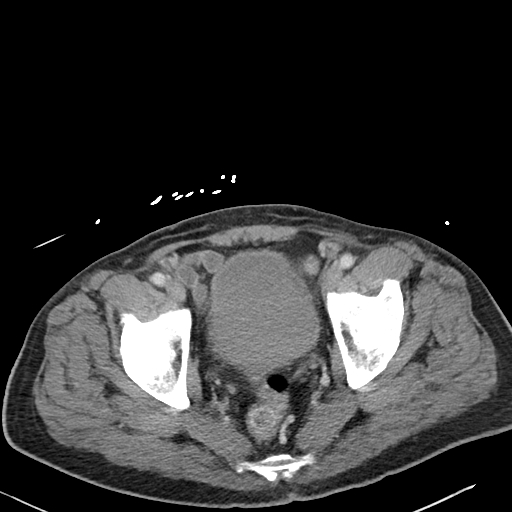
[im 36/96  soft-tissue]
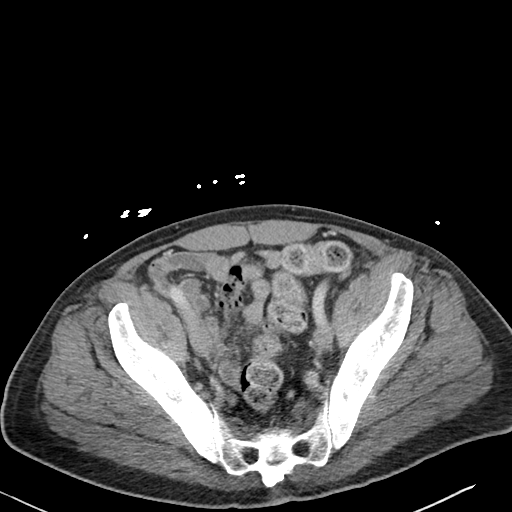
[im 46/96  soft-tissue]
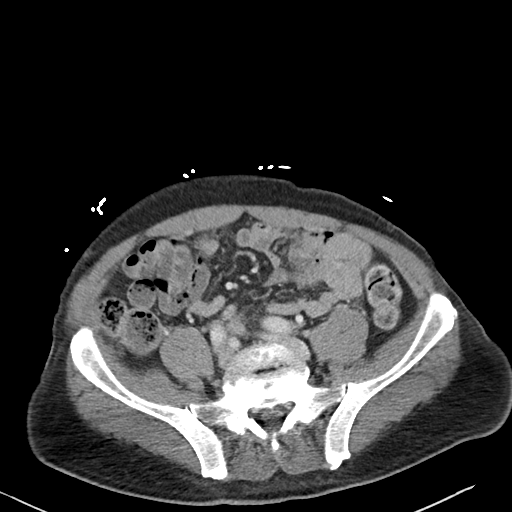
[im 51/96  soft-tissue]
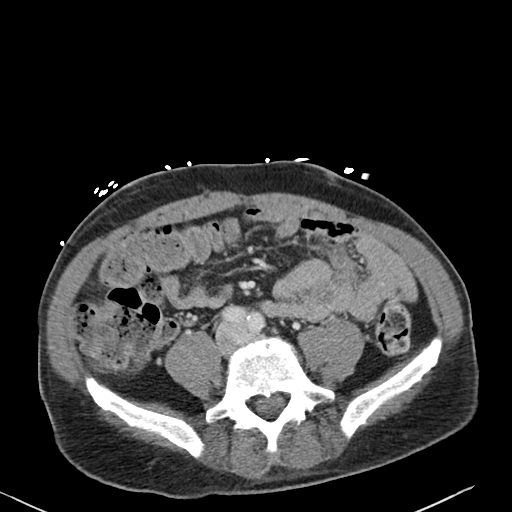
[im 61/96  soft-tissue]
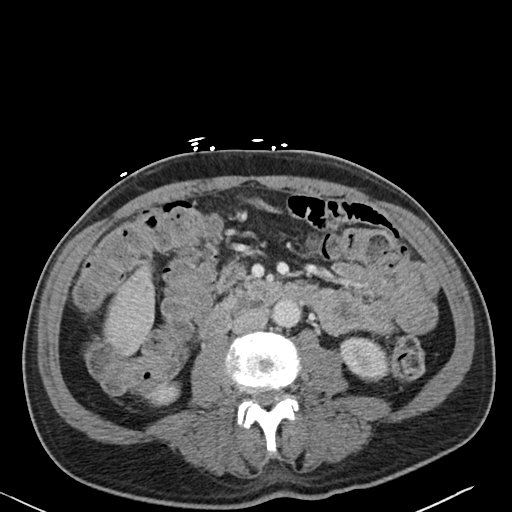
[im 66/96  soft-tissue]
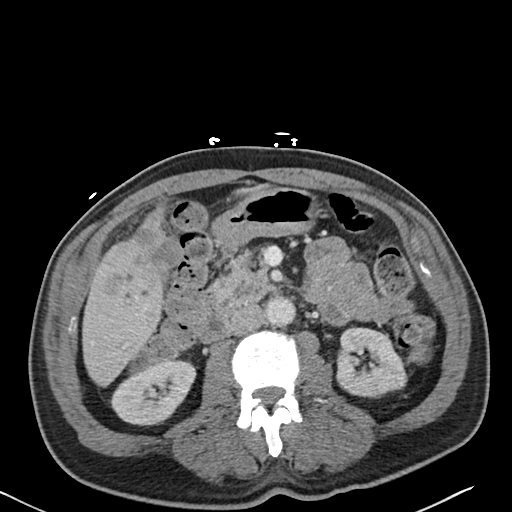
[im 66/96  bone]
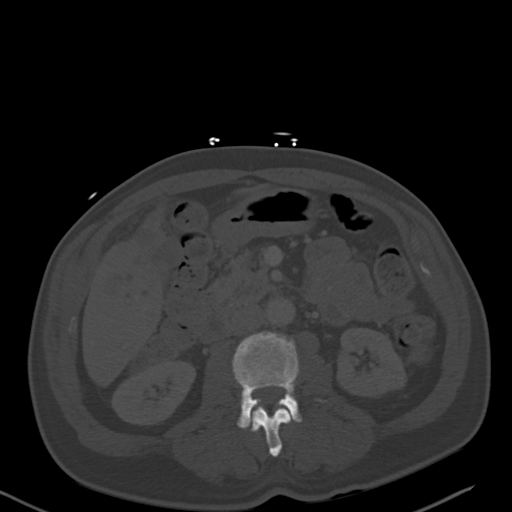
[im 76/96  soft-tissue]
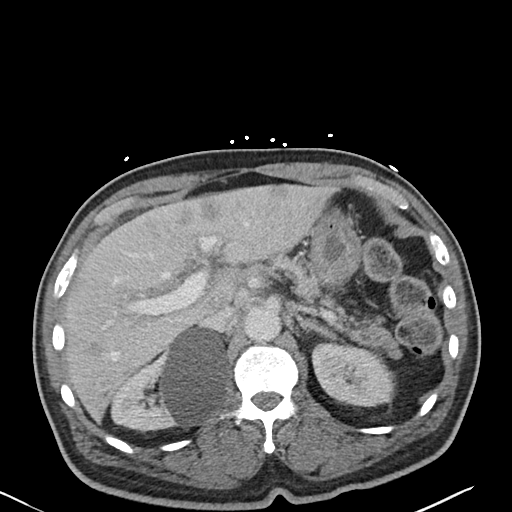
[im 81/96  soft-tissue]
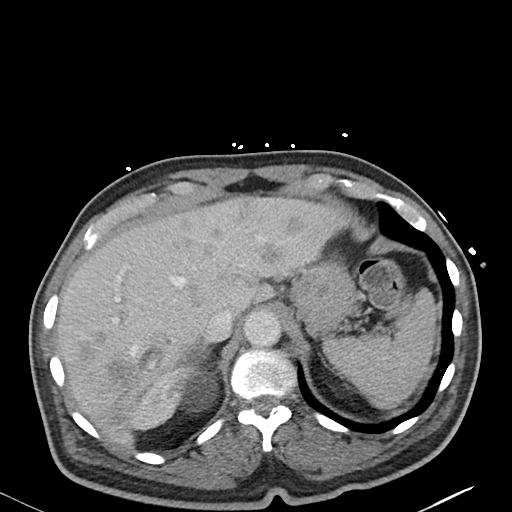
[im 91/96  soft-tissue]
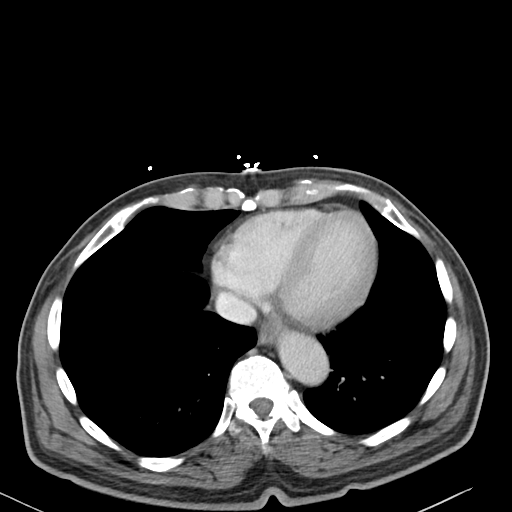

[Series 6: coronal · coronal · 0.78mm/px · 3 of 93 slices shown]
[im 31/93  soft-tissue]
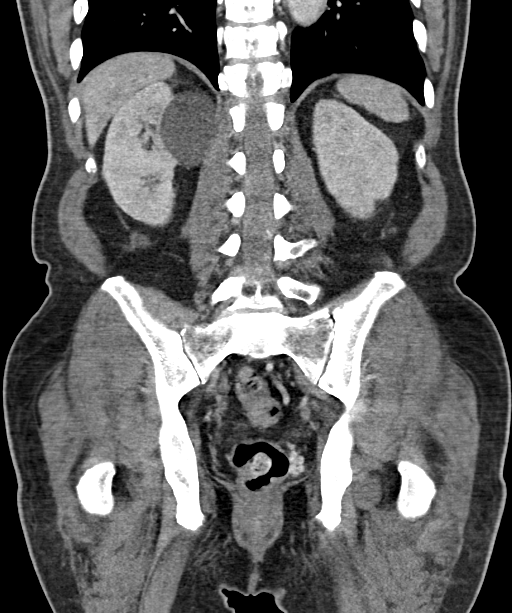
[im 41/93  soft-tissue]
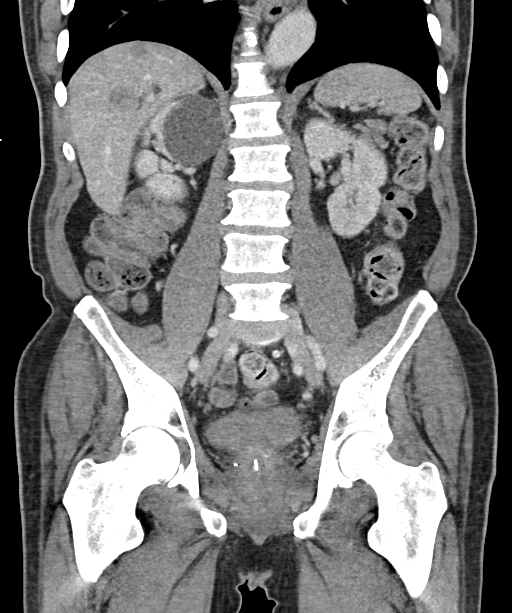
[im 52/93  soft-tissue]
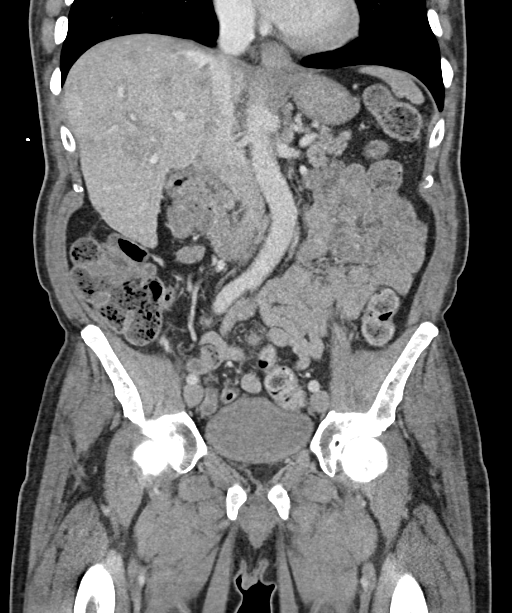

[15 of 46 positions shown; findings below may reference images not displayed]

RADIATION DOSE REDUCTION: This exam was performed according to the
departmental dose-optimization program which includes automated
exposure control, adjustment of the mA and/or kV according to
patient size and/or use of iterative reconstruction technique.

CONTRAST:  75mL OMNIPAQUE IOHEXOL 300 MG/ML  SOLN
FINDINGS: Lower chest: No acute abnormality.

Hepatobiliary: Numerous hypodense liver lesions are increased in
size and number, index lesion in the left lobe of the liver, hepatic
segment II, measures 2.4 x 2.2 cm, previously 1.3 x 1.0 cm (series
3, image 13). Contracted gallbladder, again which may be involved by
tumor (series 3, image 28). Some unchanged segmental biliary ductal
dilatation in the inferior right lobe of the liver, hepatic segments
V and VI (series 3, image 31). No gallstones, gallbladder wall
thickening, or biliary dilatation.

Pancreas: Unremarkable. No pancreatic ductal dilatation or
surrounding inflammatory changes.

Spleen: Normal in size without significant abnormality.

Adrenals/Urinary Tract: Adrenal glands are unremarkable. Simple,
benign cyst of the superior pole of the right kidney, for which no
further follow-up or characterization is required. Kidneys are
otherwise normal, without renal calculi, solid lesion, or
hydronephrosis. Bladder is unremarkable.

Stomach/Bowel: Stomach is within normal limits. Appendix appears
normal. No evidence of bowel wall thickening, distention, or
inflammatory changes. Unchanged soft tissue nodule within the
mesentery of the right colon measuring 1.2 x 1.1 cm (series 3, image
38).

Vascular/Lymphatic: No significant vascular findings are present. No
enlarged abdominal or pelvic lymph nodes.

Reproductive: Prostatomegaly with urolift implants.

Other: No abdominal wall hernia or abnormality. No ascites.

Musculoskeletal: No acute or significant osseous findings.
IMPRESSION: 1. Numerous hypodense liver lesions are increased in size and
number, consistent with worsened hepatic metastatic disease.
2. Unchanged soft tissue nodule within the mesentery of the right
colon measuring 1.2 x 1.1 cm.
3. Contracted gallbladder, again which may be involved by tumor.
Some unchanged segmental biliary ductal dilatation in the inferior
right lobe of the liver, hepatic segments V and VI.
4. Prostatomegaly.

## 2022-03-26 MED ORDER — HEPARIN SOD (PORK) LOCK FLUSH 100 UNIT/ML IV SOLN
500.0000 [IU] | Freq: Once | INTRAVENOUS | Status: AC
  Administered 2022-03-26: 500 [IU]
  Filled 2022-03-26: qty 5

## 2022-03-26 MED ORDER — IOHEXOL 300 MG/ML  SOLN
100.0000 mL | Freq: Once | INTRAMUSCULAR | Status: AC | PRN
  Administered 2022-03-26: 75 mL via INTRAVENOUS

## 2022-03-26 MED ORDER — FENTANYL CITRATE PF 50 MCG/ML IJ SOSY
50.0000 ug | PREFILLED_SYRINGE | Freq: Once | INTRAMUSCULAR | Status: AC
  Administered 2022-03-26: 50 ug via INTRAVENOUS
  Filled 2022-03-26: qty 1

## 2022-03-26 NOTE — ED Triage Notes (Signed)
Patient reports to the ER for abdominal pain. Patient's wife reports he has this from time to time as he has cancer and this is a complication he had had. Patient has unknown origin per his wife.  ?

## 2022-03-26 NOTE — Discharge Instructions (Signed)
Follow-up with your doctor within the next few days for recheck.  You also need to have your blood counts rechecked as you did have some slight drop in your hemoglobin.  You need to have this rechecked by either your oncologist or primary care doctor within the next week to make sure that it has not dropped further.  Return to emergency room if you have any worsening symptoms. ?

## 2022-03-26 NOTE — ED Notes (Signed)
Pt indicated stinging with injector test flush of saline (rate of 72m/s) in their port. Pt returned to their room w/o CT Ab/pelvis imaging and Charge RN contacted for assistance. -DMG ?

## 2022-03-26 NOTE — ED Provider Notes (Signed)
?[facility]PT ?Provider Note ? ? ?CSN: [id number] ?Arrival date & time: 03/26/22  1884 ? ?  ? ?History ? ?Chief Complaint  ?Patient presents with  ? Abdominal Pain  ? ? ?Robert Warner is a 81 y.o. male. ? ?Patient is a 81 year old male who presents with abdominal pain in his epigastrium.  He has a history of adenocarcinoma of the liver.  He is currently undergoing treatments for this.  He has had some intermittent abdominal pain for a while.  He takes oxycodone and tramadol for symptomatic relief.  He woke up this morning and said his pain was more intense and wanted to come to the emergency room.  He did not yet take anything for his pain.  He says it similar to his other episodes but is worse today.  No nausea or vomiting.  No change in his stools although he does report some constipation.  No urinary symptoms.  No fevers. ? ? ?  ? ?Home Medications ?Prior to Admission medications   ?Medication Sig Start Date End Date Taking? Authorizing Provider  ?Accu-Chek Softclix Lancets lancets Use to test blood sugar levels once a day. 11/22/21   Burchette, Alinda Sierras, MD  ?acetaminophen (TYLENOL) 325 MG tablet Take 1-2 tablets (325-650 mg total) by mouth every 4 (four) hours as needed for mild pain. ?Patient not taking: Reported on 03/24/2022 02/09/22   [name], PA-C  ?aspirin EC 81 MG EC tablet Take 1 tablet (81 mg total) by mouth daily. Swallow whole. 02/10/22   Setzer, [name], PA-C  ?atorvastatin (LIPITOR) 40 MG tablet Take 1 tablet (40 mg total) by mouth daily. 10/19/21 03/24/22  Burchette, Alinda Sierras, MD  ?Blood Glucose Monitoring Suppl (ACCU-CHEK AVIVA PLUS) w/Device KIT Use to check blood sugar once a day 11/21/21   Burchette, Alinda Sierras, MD  ?Cholecalciferol (VITAMIN D3) 2000 UNITS TABS Take 2,000 Units by mouth daily.    [provider]  ?Continuous Blood Gluc Receiver (DEXCOM G6 RECEIVER) DEVI Use to check blood sugar levels as directed. 12/02/21   Burchette, Alinda Sierras, MD  ?Continuous  Blood Gluc Sensor (DEXCOM G6 SENSOR) MISC Use to check blood glucose levels as directed. 12/02/21   Burchette, Alinda Sierras, MD  ?enoxaparin (LOVENOX) 120 MG/0.8ML injection Inject 0.8 mLs (120 mg total) into the skin daily. 02/28/22 03/30/22  Bayard Hugger, NP  ?feeding supplement, GLUCERNA SHAKE, (GLUCERNA SHAKE) LIQD Take 237 mLs by mouth 3 (three) times daily between meals. 02/09/22   Setzer, [name], PA-C  ?glucose blood test strip Use as instructed 11/21/21   Burchette, Alinda Sierras, MD  ?insulin glargine, 2 Unit Dial, (TOUJEO MAX SOLOSTAR) 300 UNIT/ML Solostar Pen Inject 16 Units into the skin daily. 03/13/22   Burchette, Alinda Sierras, MD  ?lidocaine-prilocaine (EMLA) cream Apply 1 application topically as needed. Apply 1/2 tablespoon to port site 2 hours prior to stick and cover with Press-and-Seal to numb site for access ?Patient taking differently: Apply 1 application. topically daily as needed. Apply 1/2 tablespoon to port site 2 hours prior to stick and cover with Press-and-Seal to numb site for access 10/17/21   [name], MD  ?metFORMIN (GLUCOPHAGE) 500 MG tablet Take 1 tablet (500 mg total) by mouth 2 (two) times daily with a meal. 03/13/22   Burchette, Alinda Sierras, MD  ?ondansetron (ZOFRAN) 8 MG tablet Take 1 tablet (8 mg total) by mouth every 8 (eight) hours as needed for nausea or vomiting. Start 72 hours after each IV chemotherapy  administration 10/17/21   [name], MD  ?oxyCODONE-acetaminophen (PERCOCET/ROXICET) 5-325 MG tablet Take 1 tablet by mouth every 4 (four) hours as needed for severe pain. 03/24/22   [name], MD  ?pantoprazole (PROTONIX) 20 MG tablet TAKE 1 TABLET(20 MG) BY MOUTH DAILY Strength: 20 mg ?Patient taking differently: TAKE 2 TABLET(20 MG) BY MOUTH DAILY Strength: 20 mg 03/03/22   [name], NP  ?polyethylene glycol (MIRALAX / GLYCOLAX) 17 g packet Take 34 g by mouth daily as needed. ?Patient taking differently: Take 17 g by mouth daily as needed for mild constipation. 12/09/21    Elgergawy, [name], MD  ?prochlorperazine (COMPAZINE) 10 MG tablet Take 1 tablet (10 mg total) by mouth every 6 (six) hours as needed for nausea. 10/17/21   [name], MD  ?senna-docusate (SENOKOT-S) 8.6-50 MG tablet Take 2 tablets by mouth 2 (two) times daily. Hold for diarrhea 12/09/21   Elgergawy, [name], MD  ?tamsulosin (FLOMAX) 0.4 MG CAPS capsule Take 1 capsule (0.4 mg total) by mouth daily after supper. 02/09/22   Setzer, [name], PA-C  ?TRAVATAN Z 0.004 % SOLN ophthalmic solution Place 1 drop into both eyes at bedtime. 11/12/16   [provider]  ?   ? ?Allergies    ?Patient has no known allergies.   ? ?Review of Systems   ?Review of Systems  ?Constitutional:  Negative for chills, diaphoresis, fatigue and fever.  ?HENT:  Negative for congestion, rhinorrhea and sneezing.   ?Eyes: Negative.   ?Respiratory:  Negative for cough, chest tightness and shortness of breath.   ?Cardiovascular:  Negative for chest pain and leg swelling.  ?Gastrointestinal:  Positive for abdominal pain. Negative for blood in stool, diarrhea, nausea and vomiting.  ?Genitourinary:  Negative for difficulty urinating, flank pain, frequency and hematuria.  ?Musculoskeletal:  Negative for arthralgias and back pain.  ?Skin:  Negative for rash.  ?Neurological:  Negative for dizziness, speech difficulty, weakness, numbness and headaches.  ? ?Physical Exam ?Updated Vital Signs ?BP (!) 137/96   Pulse 72   Temp 99.1 ?F (37.3 ?C) (Oral)   Resp 16   Ht _0  (1.778 m)   Wt 78.5 kg   SpO2 97%   BMI 24.82 kg/m?  ?Physical Exam ?Constitutional:   ?   Appearance: He is well-developed.  ?HENT:  ?   Head: Normocephalic and atraumatic.  ?Eyes:  ?   Pupils: Pupils are equal, round, and reactive to light.  ?Cardiovascular:  ?   Rate and Rhythm: Normal rate and regular rhythm.  ?   Heart sounds: Normal heart sounds.  ?Pulmonary:  ?   Effort: Pulmonary effort is normal. No respiratory distress.  ?   Breath sounds: Normal breath sounds.  No wheezing or rales.  ?Chest:  ?   Chest wall: No tenderness.  ?Abdominal:  ?   General: Bowel sounds are normal.  ?   Palpations: Abdomen is soft.  ?   Tenderness: There is abdominal tenderness in the epigastric area. There is no guarding or rebound.  ?Musculoskeletal:     ?   General: Normal range of motion.  ?   Cervical back: Normal range of motion and neck supple.  ?Lymphadenopathy:  ?   Cervical: No cervical adenopathy.  ?Skin: ?   General: Skin is warm and dry.  ?   Findings: No rash.  ?Neurological:  ?   Mental Status: He is alert and oriented to person, place, and time.  ? ? ?ED Results /  Procedures / Treatments   ?Labs ?(all labs ordered are listed, but only abnormal results are displayed) ?Labs Reviewed  ?COMPREHENSIVE METABOLIC PANEL - Abnormal; Notable for the following components:  ?    Result Value  ? Sodium 134 (*)   ? BUN 25 (*)   ? Calcium 8.8 (*)   ? Albumin 3.4 (*)   ? Alkaline Phosphatase 131 (*)   ? All other components within normal limits  ?CBC - Abnormal; Notable for the following components:  ? WBC 3.4 (*)   ? RBC 3.38 (*)   ? Hemoglobin 9.7 (*)   ? HCT 29.8 (*)   ? All other components within normal limits  ?LIPASE, BLOOD  ? ? ?EKG ?EKG Interpretation ? ?Date/Time:  Sunday Mar 26 2022 09:01:32 EDT ?Ventricular Rate:  81 ?PR Interval:  164 ?QRS Duration: 86 ?QT Interval:  354 ?QTC Calculation: 411 ?R Axis:   -38 ?Text Interpretation: Sinus rhythm Ventricular premature complex Left axis deviation Confirmed by Malvin Johns [phone]) on 03/26/2022 9:33:15 AM ? ?Radiology ?CT Abdomen Pelvis W Contrast ? ?Result Date: 03/26/2022 ?CLINICAL DATA:  Abdominal pain, metastatic cancer of unknown primary * Tracking Code: BO * EXAM: CT ABDOMEN AND PELVIS WITH CONTRAST TECHNIQUE: Multidetector CT imaging of the abdomen and pelvis was performed using the standard protocol following bolus administration of intravenous contrast. RADIATION DOSE REDUCTION: This exam was performed according to the departmental  dose-optimization program which includes automated exposure control, adjustment of the mA and/or kV according to patient size and/or use of iterative reconstruction technique. CONTRAST:  63m OMNIPAQUE IOH

## 2022-03-28 ENCOUNTER — Ambulatory Visit (INDEPENDENT_AMBULATORY_CARE_PROVIDER_SITE_OTHER): Payer: Medicare HMO | Admitting: Neurology

## 2022-03-28 ENCOUNTER — Encounter: Payer: Self-pay | Admitting: Neurology

## 2022-03-28 VITALS — BP 130/77 | HR 81 | Ht 71.0 in | Wt 177.0 lb

## 2022-03-28 DIAGNOSIS — I63433 Cerebral infarction due to embolism of bilateral posterior cerebral arteries: Secondary | ICD-10-CM

## 2022-03-28 DIAGNOSIS — R269 Unspecified abnormalities of gait and mobility: Secondary | ICD-10-CM

## 2022-03-28 NOTE — Progress Notes (Signed)
Chief Complaint  Patient presents with   Hospitalization Follow-up    Rm 14. Accompanied by wife, Hoyle Sauer. Saw Dr. Erlinda Hong in the hospital and needs f/u w/ Dr. Krista Blue.      ASSESSMENT AND PLAN  Robert Warner is a 81 y.o. male   Multiple small embolic stroke  Recurrent multiple small embolic stroke again in March 2023,  CT angiogram of head and neck showed no large vessel disease,  Vascular risk factors of poorly controlled diabetes, A1c 11.9 in January, 8.0 in March 2023, LDL 85,  His stroke is clearly related to hypercoagulable status due to his metastatic cancer,  He was on Eliquis, was switched to Lovenox,   Left peroneal deep venous thrombosis of unknown age Was on Eliquis, now on Lovenox  Metastatic adenocarcinoma of liver of unknown primary,    DIAGNOSTIC DATA (LABS, IMAGING, TESTING) - I reviewed patient records, labs, notes, testing and imaging myself where available.  Echocardiogram September 17, 2021, showed normal ejection fraction, no valvular disease identified.Agitated saline contrast bubble study was positive with shunting observed within 3-6 cardiac cycles suggestive of interatrial shunt.  Ultrasound of lower extremity demonstrated left peroneal venous thrombosis.  MEDICAL HISTORY:  Robert Warner is a 81 year old male, seen in request by his primary care physician Dr. [name], for evaluation of gait abnormality, stroke follow-up, initial evaluation was on December 30, 2021.  I reviewed and summarized the referring note. PMHX. HTN HLD Metastatic adenocarcinoma to liver of unknown primary  Stroke, 1st Nov 4th 2022  I reviewed multiple office note and previous hospital discharge  First hospital admission in November 2022, presented to for unsteady gait woke up from sleep on September 16, 2021, MRI of the brain reviewed multiple small foci of positive DWI lesions, involving bilateral cerebellum and, cerebral hemisphere consistent with acute embolic  event  Echocardiogram showed intra atrium right-to-left shunt confirmed by transcranial Doppler study with bubble  A1c 6.9, LDL 97, he was supposed to take aspirin, and Plavix, but antiplatelet agent was on hold due to incidental findings of liver mass noted on September 03, 2021  Hospital admission again in January 2023, presented to emergency room for dizziness, poor balance, MRI of the brain showed new embolic event, involving right cerebellum, left splenium,  Doppler study was positive for acute left peroneal venous DVT  He was put on heparin drip, then transition to Eliquis, work-up also showed Klebsiella bacteremia.  He is under oncologist Dr.Sherrill care for poorly differentiated adenocarcinoma of the liver,  PET scan 10/10/2021-solitary 10 mm right upper lobe pulmonary nodule with minimal FDG uptake.  7.5 mm left internal mammary lymph node hypermetabolic with SUV max 4.09.  Numerous hepatic lesions.  Periportal lymphadenopathy SUV max 8.18.  2 adjacent lesions noted in the mesentery in the right mid abdomen with somewhat irregular margins, SUV 8.09.  No hypermetabolic colonic lesion identified to suggest a primary colon cancer.  No pancreatic lesion.  No gastric lesion.  No retroperitoneal lymphadenopathy.  UPDATE Mar 28 2022: Hospital admission again on March 13th 2023 for recurrent stroke, MRI showed bilateral right more than left cerebral punctuated infarction at the left cerebellar infarction, secondary embolism from hypercoagulable status secondary to advanced cancer, CT angiogram of head and neck showed no large vessel disease, while he was already on Eliquis, it was switched to Lovenex 147m very 24 hours,  by oncologist Dr. SLearta Codding   Doppler study showed chronic left DVT LDL 85, A1c 11.9  Reviewed oncologist Dr. SBenay Spice  note on January 27, 2022, poorly differentiated adenocarcinoma involving the liver, PET scan showed left internal mammary infrarenal hypermetabolic, numerous  hepatic lesions, periportal lymphadenopathy, he has been treated with Folfox since December 2022,   PHYSICAL EXAM:   Vitals:   03/28/22 0911  BP: 130/77  Pulse: 81  Weight: 177 lb (80.3 kg)  Height: _0  (1.803 m)   Not recorded     Body mass index is 24.69 kg/m.  PHYSICAL EXAMNIATION:  Gen: NAD, conversant, well nourised, well groomed                     Cardiovascular: Regular rate rhythm, no peripheral edema, warm, nontender. Eyes: Conjunctivae clear without exudates or hemorrhage Neck: Supple, no carotid bruits. Pulmonary: Clear to auscultation bilaterally   NEUROLOGICAL EXAM:  MENTAL STATUS: Speech/cognition: Tired looking elderly male, awake, alert, oriented to history taking care of conversation   CRANIAL NERVES: CN II: Visual fields are full to confrontation. Pupils are round equal and briskly reactive to light. CN III, IV, VI: extraocular movement are normal. No ptosis. CN V: Facial sensation is intact to light touch CN VII: Face is symmetric with normal eye closure  CN VIII: Hearing is normal to causal conversation. CN IX, X: Phonation is normal. CN XI: Head turning and shoulder shrug are intact  MOTOR: mild fixation of left arm, moderate right ankle dorsiflexion, eversion weakness  REFLEXES: Reflexes are 1 and symmetric at the biceps, triceps, knees, and absent at ankles. Plantar responses are flexor.  SENSORY: Intact to light touch, pinprick and vibratory sensation are intact in fingers and toes.  COORDINATION: There is no trunk or limb dysmetria noted.  GAIT/STANCE: Unsteady gait, right foot drop, could not stand on right heel  REVIEW OF SYSTEMS:  Full 14 system review of systems performed and notable only for as above All other review of systems were negative.   ALLERGIES: No Known Allergies  HOME MEDICATIONS: Current Outpatient Medications  Medication Sig Dispense Refill   Accu-Chek Softclix Lancets lancets Use to test blood sugar  levels once a day. 100 each 12   acetaminophen (TYLENOL) 325 MG tablet Take 1-2 tablets (325-650 mg total) by mouth every 4 (four) hours as needed for mild pain.     aspirin EC 81 MG EC tablet Take 1 tablet (81 mg total) by mouth daily. Swallow whole. 30 tablet 0   Blood Glucose Monitoring Suppl (ACCU-CHEK AVIVA PLUS) w/Device KIT Use to check blood sugar once a day 1 kit 0   Cholecalciferol (VITAMIN D3) 2000 UNITS TABS Take 2,000 Units by mouth daily.     Continuous Blood Gluc Receiver (DEXCOM G6 RECEIVER) DEVI Use to check blood sugar levels as directed. 1 each 0   Continuous Blood Gluc Sensor (DEXCOM G6 SENSOR) MISC Use to check blood glucose levels as directed. 3 each 1   enoxaparin (LOVENOX) 120 MG/0.8ML injection Inject 0.8 mLs (120 mg total) into the skin daily. 24 mL 0   feeding supplement, GLUCERNA SHAKE, (GLUCERNA SHAKE) LIQD Take 237 mLs by mouth 3 (three) times daily between meals.  0   glucose blood test strip Use as instructed 100 each 12   insulin glargine, 2 Unit Dial, (TOUJEO MAX SOLOSTAR) 300 UNIT/ML Solostar Pen Inject 16 Units into the skin daily. 3 mL 1   lidocaine-prilocaine (EMLA) cream Apply 1 application topically as needed. Apply 1/2 tablespoon to port site 2 hours prior to stick and cover with Press-and-Seal to numb site for access (Patient taking  differently: Apply 1 application. topically daily as needed. Apply 1/2 tablespoon to port site 2 hours prior to stick and cover with Press-and-Seal to numb site for access) 30 g 1   metFORMIN (GLUCOPHAGE) 500 MG tablet Take 1 tablet (500 mg total) by mouth 2 (two) times daily with a meal. 180 tablet 3   ondansetron (ZOFRAN) 8 MG tablet Take 1 tablet (8 mg total) by mouth every 8 (eight) hours as needed for nausea or vomiting. Start 72 hours after each IV chemotherapy administration 30 tablet 1   oxyCODONE-acetaminophen (PERCOCET/ROXICET) 5-325 MG tablet Take 1 tablet by mouth every 4 (four) hours as needed for severe pain. 60 tablet  0   pantoprazole (PROTONIX) 20 MG tablet TAKE 1 TABLET(20 MG) BY MOUTH DAILY Strength: 20 mg (Patient taking differently: TAKE 2 TABLET(20 MG) BY MOUTH DAILY Strength: 20 mg) 90 tablet 3   polyethylene glycol (MIRALAX / GLYCOLAX) 17 g packet Take 34 g by mouth daily as needed. (Patient taking differently: Take 17 g by mouth daily as needed for mild constipation.) 14 each 0   prochlorperazine (COMPAZINE) 10 MG tablet Take 1 tablet (10 mg total) by mouth every 6 (six) hours as needed for nausea. 60 tablet 1   senna-docusate (SENOKOT-S) 8.6-50 MG tablet Take 2 tablets by mouth 2 (two) times daily. Hold for diarrhea     tamsulosin (FLOMAX) 0.4 MG CAPS capsule Take 1 capsule (0.4 mg total) by mouth daily after supper. 30 capsule 0   TRAVATAN Z 0.004 % SOLN ophthalmic solution Place 1 drop into both eyes at bedtime.     atorvastatin (LIPITOR) 40 MG tablet Take 1 tablet (40 mg total) by mouth daily. 90 tablet 1   No current facility-administered medications for this visit.    PAST MEDICAL HISTORY: Past Medical History:  Diagnosis Date   Arthritis    Cancer ([place])    Cataract    Diabetes mellitus    Hyperlipidemia    Stroke [facility])     PAST SURGICAL HISTORY: Past Surgical History:  Procedure Laterality Date   APPENDECTOMY  1968   CATARACT EXTRACTION Bilateral 07/2000   states 2 weeks ago (first of sept) OD due in Dec    COLONOSCOPY  2004,2009   Negative, Dr. Sharlett Iles    IR IMAGING GUIDED PORT INSERTION  10/14/2021   KIDNEY STONE SURGERY  10/2018   PROSTATE BIOPSY  2006   Dr.Sigmund Tannebaum    FAMILY HISTORY: Family History  Problem Relation Age of Onset   Hypertension Mother    Coronary artery disease Mother    Stroke Father 75   Stroke Sister    Pancreatic cancer Brother    Diabetes Maternal Aunt    Coronary artery disease Maternal Uncle        2 Maternal Uncles    Diabetes Maternal Uncle    Breast cancer Paternal Aunt    Coronary artery disease Paternal Aunt    Coronary  artery disease Maternal Grandmother    Stroke Paternal Grandmother    Heart disease Neg Hx    Colon cancer Neg Hx    Esophageal cancer Neg Hx    Rectal cancer Neg Hx    Stomach cancer Neg Hx     SOCIAL HISTORY: Social History   Socioeconomic History   Marital status: Married    Spouse name: Not on file   Number of children: 2   Years of education: Not on file   Highest education level: Not on file  Occupational History  Occupation: retired Airline pilot  Tobacco Use   Smoking status: Former    Packs/day: 0.50    Years: 10.00    Pack years: 5.00    Types: Cigarettes    Quit date: 11/13/1966    Years since quitting: 55.4   Smokeless tobacco: Never  Vaping Use   Vaping Use: Never used  Substance and Sexual Activity   Alcohol use: Not Currently    Alcohol/week: 2.0 standard drinks    Types: 2 Glasses of wine per week    Comment: Occasional wine or beer   Drug use: No   Sexual activity: Yes  Other Topics Concern   Not on file  Social History Narrative   Lives at home with wife   Right handed   Caffeine: 1 cup occasionally    Social Determinants of Health   Financial Resource Strain: Low Risk    Difficulty of Paying Living Expenses: Not hard at all  Food Insecurity: No Food Insecurity   Worried About Charity fundraiser in the Last Year: Never true   Ran Out of Food in the Last Year: Never true  Transportation Needs: No Transportation Needs   Lack of Transportation (Medical): No   Lack of Transportation (Non-Medical): No  Physical Activity: Sufficiently Active   Days of Exercise per Week: 3 days   Minutes of Exercise per Session: 60 min  Stress: No Stress Concern Present   Feeling of Stress : Not at all  Social Connections: Socially Integrated   Frequency of Communication with Friends and Family: Three times a week   Frequency of Social Gatherings with Friends and Family: Three times a week   Attends Religious Services: More than 4 times per year   Active Member  of Clubs or Organizations: Yes   Attends Music therapist: More than 4 times per year   Marital Status: Married  Human resources officer Violence: Not At Risk   Fear of Current or Ex-Partner: No   Emotionally Abused: No   Physically Abused: No   Sexually Abused: No    Marcial Pacas, M.D. Ph.D.  [facility] [address], [place], [place] [address] Ph: [phone] Fax: [phone]  CC:  Rosalin Hawking, MD [place],  [place] [address]  [name], MD

## 2022-03-30 ENCOUNTER — Telehealth: Payer: Self-pay | Admitting: Pharmacist

## 2022-03-30 ENCOUNTER — Telehealth: Payer: Self-pay | Admitting: Family Medicine

## 2022-03-30 NOTE — Chronic Care Management (AMB) (Signed)
Chronic Care Management Pharmacy Assistant   Name: Robert Warner  MRN: [id number] DOB: [birth year]  03/30/22 APPOINTMENT REMINDER   Patients's wife was reminded to have all medications, supplements and any blood glucose and blood pressure readings available for review with [name], Pharm. D, for telephone visit on 08/31/22 at 10:30.  She has confirmed to call the home number listed in chart  Care Gaps: Zoster Vaccine - Overdue TDAP - Overdue Foot Exam - Overdue Urine Micro - Overdue AWV- 9-22 Bp- 130/77 ( 03/28/22) Lab Results  Component Value Date   HGBA1C 8.0 (H) 01/24/2022    Star Rating Drug: Atorvastatin (Lipitor) 40 mg - Last filled 01/23/22 90 DS at [facility] Metformin 750 mg - Last filled 02/09/22 90 DS at [facility]     Medications: Outpatient Encounter Medications as of 03/30/2022  Medication Sig   Accu-Chek Softclix Lancets lancets Use to test blood sugar levels once a day.   acetaminophen (TYLENOL) 325 MG tablet Take 1-2 tablets (325-650 mg total) by mouth every 4 (four) hours as needed for mild pain.   aspirin EC 81 MG EC tablet Take 1 tablet (81 mg total) by mouth daily. Swallow whole.   atorvastatin (LIPITOR) 40 MG tablet Take 1 tablet (40 mg total) by mouth daily.   Blood Glucose Monitoring Suppl (ACCU-CHEK AVIVA PLUS) w/Device KIT Use to check blood sugar once a day   Cholecalciferol (VITAMIN D3) 2000 UNITS TABS Take 2,000 Units by mouth daily.   Continuous Blood Gluc Receiver (DEXCOM G6 RECEIVER) DEVI Use to check blood sugar levels as directed.   Continuous Blood Gluc Sensor (DEXCOM G6 SENSOR) MISC Use to check blood glucose levels as directed.   enoxaparin (LOVENOX) 120 MG/0.8ML injection Inject 0.8 mLs (120 mg total) into the skin daily.   feeding supplement, GLUCERNA SHAKE, (GLUCERNA SHAKE) LIQD Take 237 mLs by mouth 3 (three) times daily between meals.   glucose blood test strip Use as instructed   insulin glargine, 2 Unit Dial, (TOUJEO MAX  SOLOSTAR) 300 UNIT/ML Solostar Pen Inject 16 Units into the skin daily.   lidocaine-prilocaine (EMLA) cream Apply 1 application topically as needed. Apply 1/2 tablespoon to port site 2 hours prior to stick and cover with Press-and-Seal to numb site for access (Patient taking differently: Apply 1 application. topically daily as needed. Apply 1/2 tablespoon to port site 2 hours prior to stick and cover with Press-and-Seal to numb site for access)   metFORMIN (GLUCOPHAGE) 500 MG tablet Take 1 tablet (500 mg total) by mouth 2 (two) times daily with a meal.   ondansetron (ZOFRAN) 8 MG tablet Take 1 tablet (8 mg total) by mouth every 8 (eight) hours as needed for nausea or vomiting. Start 72 hours after each IV chemotherapy administration   oxyCODONE-acetaminophen (PERCOCET/ROXICET) 5-325 MG tablet Take 1 tablet by mouth every 4 (four) hours as needed for severe pain.   pantoprazole (PROTONIX) 20 MG tablet TAKE 1 TABLET(20 MG) BY MOUTH DAILY Strength: 20 mg (Patient taking differently: TAKE 2 TABLET(20 MG) BY MOUTH DAILY Strength: 20 mg)   polyethylene glycol (MIRALAX / GLYCOLAX) 17 g packet Take 34 g by mouth daily as needed. (Patient taking differently: Take 17 g by mouth daily as needed for mild constipation.)   prochlorperazine (COMPAZINE) 10 MG tablet Take 1 tablet (10 mg total) by mouth every 6 (six) hours as needed for nausea.   senna-docusate (SENOKOT-S) 8.6-50 MG tablet Take 2 tablets by mouth 2 (two) times daily. Hold for diarrhea  tamsulosin (FLOMAX) 0.4 MG CAPS capsule Take 1 capsule (0.4 mg total) by mouth daily after supper.   TRAVATAN Z 0.004 % SOLN ophthalmic solution Place 1 drop into both eyes at bedtime.   No facility-administered encounter medications on file as of 03/30/2022.     [place] Clinical Pharmacist Assistant [phone]

## 2022-03-30 NOTE — Telephone Encounter (Signed)
Robert Warner with [facility] is calling and needs a call by 9 am tomorrow 03-31-2022 to sch peer to peer appt with dr Elease Hashimoto. The peer to peer must by done by 1 pm tomorrow for home health services for this patient

## 2022-03-31 ENCOUNTER — Ambulatory Visit: Payer: Medicare HMO | Admitting: Pharmacist

## 2022-03-31 ENCOUNTER — Other Ambulatory Visit: Payer: Self-pay | Admitting: Oncology

## 2022-03-31 DIAGNOSIS — I63433 Cerebral infarction due to embolism of bilateral posterior cerebral arteries: Secondary | ICD-10-CM

## 2022-03-31 DIAGNOSIS — E1165 Type 2 diabetes mellitus with hyperglycemia: Secondary | ICD-10-CM

## 2022-03-31 NOTE — Progress Notes (Signed)
Chronic Care Management Pharmacy Note  03/31/2022 Name:  Robert Warner MRN:  [id number] DOB:  11-19-1940  Summary: A1c at goal < 7% LDL not at goal < 70  Recommendations/Changes made from today's visit: -Recommend repeat lipid panel -Recommended for patient to check on Toujeo cost  Plan: Follow up general assessment in 2 months  Subjective: Robert Warner is an 81 y.o. year old male who is a primary patient of Burchette, Alinda Sierras, MD.  The CCM team was consulted for assistance with disease management and care coordination needs.    Engaged with patient by telephone for follow up visit in response to provider referral for pharmacy case management and/or care coordination services.   Consent to Services:  The patient was given information about Chronic Care Management services, agreed to services, and gave verbal consent prior to initiation of services.  Please see initial visit note for detailed documentation.   Patient Care Team: [name], MD as PCP - General (Family Medicine) [name], MD as Consulting Physician (Ophthalmology) Rosemary Holms, DPM as Consulting Physician (Podiatry) Minor, Orlie Pollen, DDS (Dentistry) Armbruster, Carlota Raspberry, MD as Consulting Physician (Gastroenterology) Festus Aloe, MD as Consulting Physician (Urology) Viona Gilmore, [facility] as Pharmacist (Pharmacist) Lin Givens, RN as Oncology Nurse Navigator  Recent office visits: 03/13/22 [name], MD: Patient presented for DM follow up.  Follow up in 3 months.  01/16/22 Burchette, Alinda Sierras, MD - Patient presented for Hearing loss die to cerumen impaction left and other concerns. Stopped Insulin Lispro.  12/02/21 Burchette, Alinda Sierras, MD - Patient presented for Type 2 diabetes and other concerns. Prescribed Insulin Lispro 200 unit/ml  Recent consult visits: 03/28/22 Marcial Pacas, MD (neurology): Patient presented for hospital follow up.  03/24/22 [name], MD  (oncology): Patient presented for cancer follow up and infusion of gemcitabine, cisplatin and trastuzumab.  03/09/22 Ned Card, NP (hematology/oncology): Patient presented for cancer follow up and starting cycle 2 of gemcitabine, cisplatin and trastuzumab.  02/28/22 Danella Sensing, NP (physical medicine): Patient presented for acute stroke follow up and transitional care. Follow up in 4-6 weeks.  02/17/22 [name], MD (oncology): Patient presented for cancer follow up. Plan to begin treatment on 4/12 with gemcitabine, cisplatin and trastuzumab.  01/12/22 [name], MD (Oncology) - Patient presented for Cancer with unknown primary site. No medication changes.  01/03/21 Carlyle Basques, MD (Inf Disease) - Patient presented for Hospital follow up. No medication changes noted.   12/30/21 Marcial Pacas, MD (Neurology) - Patient presented for CVS and other concerns. No medication changes.   12/28/21 [name], MD (Oncology) - Patient presented for [facility] in place. No medication changes.   12/23/21 [name], MD (Oncology) - Patient presented for Adenocarcinoma determined by biopsy of liver. No medication changes.   11/30/21 [name], NP (Oncology) - Patient presented for Cancer with unknown primary site. Stopped Metronidazole.   11/21/21 Festus Aloe (Urology) - Patient presented for BPH and other concerns. No medication changes noted.   11/18/21 Patient presented to [facility] [place] [facility] visits: 03/26/22 Patient presented to [place] ED for abdominal pain.  No medication changes.  Medication Reconciliation was completed by comparing discharge summary, patient's EMR and Pharmacy list, and upon discussion with patient.   Patient presented to [facility] on  01/27/22 due to CVA. Patient was present for 13 days     New?Medications Started  at Hospital Discharge:?? -started  acetaminophen  (TYLENOL) aspirin feeding supplement (GLUCERNA SHAKE) metFORMIN (GLUCOPHAGE) tamsulosin (FLOMAX   Medication Changes at Hospital Discharge: -Changed  none   Medications Discontinued at Hospital Discharge: -Stopped acetaminophen 650 MG CR tablet (TYLENOL) cefdinir 300 MG capsule (OMNICEF) insulin aspart 100 UNIT/ML injection (novoLOG) metFORMIN 750 MG 24 hr tablet (GLUCOPHAGE-XR) silodosin 8 MG Caps capsule (RAPAFLO) traMADol 50 MG tablet (ULTRAM)   Medications that remain the same after Hospital Discharge:??  -All other medications will remain the same.     Medication Reconciliation was completed by comparing discharge summary, patient's EMR and Pharmacy list, and upon discussion with patient.       Patient presented to [facility] on  01/23/22 due to Acute embolic stroke. Patient was present for 4 days     New?Medications Started at [facility] Discharge:?? -started  cefdinir (OMNICEF) enoxaparin (LOVENOX   Medication Changes at Hospital Discharge: -Changed  Toujeo Max SoloStar   Medications Discontinued at Hospital Discharge: -Stopped apixaban 5 MG Tabs tablet   Medications that remain the same after Hospital Discharge:??  -All other medications will remain the same.   Medication Reconciliation was completed by comparing discharge summary, patient's EMR and Pharmacy list, and upon discussion with patient.   Patient presented to [facility] on 12/04/21 due to Acute embolic stroke. Patient was present for 5 days.   New?Medications Started at [facility] Discharge:??   apixaban (ELIQUIS) cefadroxil (DURICEF) insulin aspart (novoLOG) polyethylene glycol (MIRALAX / GLYCOLAX) senna-docusate (Senokot-S)   Medication Changes at Hospital Discharge: -Changed  Toujeo Max SoloStar   Medications Discontinued at Hospital Discharge: -Stopped  clopidogrel 75 MG tablet (PLAVIX) HumaLOG KwikPen 200 UNIT/ML   Medications that remain the same  after Hospital Discharge:??  -All other medications will remain the same.      Objective:  Lab Results  Component Value Date   CREATININE 1.16 03/26/2022   BUN 25 (H) 03/26/2022   GFR 54.73 (L) 09/13/2021   GFRNONAA >60 03/26/2022   GFRAA 95 06/15/2008   NA 134 (L) 03/26/2022   K 4.5 03/26/2022   CALCIUM 8.8 (L) 03/26/2022   CO2 24 03/26/2022   GLUCOSE 86 03/26/2022    Lab Results  Component Value Date/Time   HGBA1C 8.0 (H) 01/24/2022 06:05 AM   HGBA1C 11.9 (H) 12/04/2021 10:57 PM   HGBA1C 7.4 07/04/2017 12:00 AM   GFR 54.73 (L) 09/13/2021 07:19 AM   GFR 57.78 (L) 01/31/2021 01:41 PM   MICROALBUR <0.7 01/31/2021 01:41 PM   MICROALBUR <0.7 07/07/2019 09:09 AM    Last diabetic Eye exam:  Lab Results  Component Value Date/Time   HMDIABEYEEXA No Retinopathy 08/10/2021 12:00 AM    Last diabetic Foot exam:  Lab Results  Component Value Date/Time   HMDIABFOOTEX normal 06/07/2015 12:00 AM     Lab Results  Component Value Date   CHOL 158 01/24/2022   HDL 64 01/24/2022   LDLCALC 85 01/24/2022   LDLDIRECT 64.7 03/24/2013   TRIG 47 01/24/2022   CHOLHDL 2.5 01/24/2022       Latest Ref Rng & Units 03/26/2022    9:31 AM 03/24/2022    8:16 AM 03/09/2022    8:11 AM  Hepatic Function  Total Protein 6.5 - 8.1 g/dL 6.6   7.2   7.4    Albumin 3.5 - 5.0 g/dL 3.4   3.6   3.6    AST 15 - 41 U/L 39   38   36  ALT 0 - 44 U/L _0 Alk Phosphatase 38 - 126 U/L 131   144   158    Total Bilirubin 0.3 - 1.2 mg/dL 0.5   0.6   0.6      Lab Results  Component Value Date/Time   TSH 0.465 12/04/2021 03:03 PM   TSH 1.25 10/15/2013 10:15 AM   TSH 0.86 07/26/2011 10:03 AM       Latest Ref Rng & Units 03/26/2022    9:31 AM 03/24/2022    8:16 AM 03/09/2022    8:11 AM  CBC  WBC 4.0 - 10.5 K/uL 3.4   4.0   3.6    Hemoglobin 13.0 - 17.0 g/dL 9.7   10.7   11.0    Hematocrit 39.0 - 52.0 % 29.8   32.8   34.3    Platelets 150 - 400 K/uL 172   184   237      No results found  for: VD25OH  Clinical ASCVD: No  The ASCVD Risk score (Arnett DK, et al., 2019) failed to calculate for the following reasons:   The 2019 ASCVD risk score is only valid for ages 53 to 2   The patient has a prior MI or stroke diagnosis       02/28/2022    3:04 PM 01/16/2022    4:05 PM 01/03/2022    8:46 AM  Depression screen PHQ 2/9  Decreased Interest 2  0  Down, Depressed, Hopeless 1 0 0  PHQ - 2 Score 3 0 0  Altered sleeping 2 0   Tired, decreased energy 3 0   Change in appetite 1 0   Feeling bad or failure about yourself  0 0   Trouble concentrating 2 0   Moving slowly or fidgety/restless 1 0   Suicidal thoughts 0 0   PHQ-9 Score 12 0   Difficult doing work/chores Somewhat difficult        Social History   Tobacco Use  Smoking Status Former   Packs/day: 0.50   Years: 10.00   Pack years: 5.00   Types: Cigarettes   Quit date: 11/13/1966   Years since quitting: 55.4  Smokeless Tobacco Never   BP Readings from Last 3 Encounters:  03/28/22 130/77  03/26/22 131/85  03/24/22 121/82   Pulse Readings from Last 3 Encounters:  03/28/22 81  03/26/22 72  03/24/22 80   Wt Readings from Last 3 Encounters:  03/28/22 177 lb (80.3 kg)  03/26/22 173 lb (78.5 kg)  03/24/22 173 lb 9.6 oz (78.7 kg)   BMI Readings from Last 3 Encounters:  03/28/22 24.69 kg/m  03/26/22 24.82 kg/m  03/24/22 24.91 kg/m    Assessment/Interventions: Review of patient past medical history, allergies, medications, health status, including review of consultants reports, laboratory and other test data, was performed as part of comprehensive evaluation and provision of chronic care management services.   SDOH:  (Social Determinants of Health) assessments and interventions performed: No   SDOH Screenings   Alcohol Screen: Low Risk    Last Alcohol Screening Score (AUDIT): 0  Depression (PHQ2-9): Medium Risk   PHQ-2 Score: 12  Financial Resource Strain: Low Risk    Difficulty of Paying Living  Expenses: Not hard at all  Food Insecurity: No Food Insecurity   Worried About Charity fundraiser in the Last Year: Never true   Ran Out of Food in the Last Year: Never true  Housing:  Low Risk    Last Housing Risk Score: 0  Physical Activity: Sufficiently Active   Days of Exercise per Week: 3 days   Minutes of Exercise per Session: 60 min  Social Connections: Socially Integrated   Frequency of Communication with Friends and Family: Three times a week   Frequency of Social Gatherings with Friends and Family: Three times a week   Attends Religious Services: More than 4 times per year   Active Member of Clubs or Organizations: Yes   Attends Music therapist: More than 4 times per year   Marital Status: Married  Stress: No Stress Concern Present   Feeling of Stress : Not at all  Tobacco Use: Medium Risk   Smoking Tobacco Use: Former   Smokeless Tobacco Use: Never   Passive Exposure: Not on file  Transportation Needs: No Transportation Needs   Lack of Transportation (Medical): No   Lack of Transportation (Non-Medical): No    CCM Care Plan  No Known Allergies  Medications Reviewed Today     Reviewed by Reyne Dumas, CMA (Certified Medical Assistant) on 03/28/22 at [place] List Status: <None>   Medication Order Taking? Sig Documenting Provider Last Dose Status Informant  Accu-Chek Softclix Lancets lancets [id number] Yes Use to test blood sugar levels once a day. [name], MD Taking Active Spouse/Significant Other  acetaminophen (TYLENOL) 325 MG tablet [id number] Yes Take 1-2 tablets (325-650 mg total) by mouth every 4 (four) hours as needed for mild pain. [name], PA-C Taking Active   aspirin EC 81 MG EC tablet [id number] Yes Take 1 tablet (81 mg total) by mouth daily. Swallow whole. Setzer, [name], PA-C Taking Active   atorvastatin (LIPITOR) 40 MG tablet [id number]  Take 1 tablet (40 mg total) by mouth daily. [name], MD  Expired 03/24/22  2359 Spouse/Significant Other  Blood Glucose Monitoring Suppl (ACCU-CHEK AVIVA PLUS) w/Device KIT [id number] Yes Use to check blood sugar once a day [name], MD Taking Active Spouse/Significant Other  Cholecalciferol (VITAMIN D3) 2000 UNITS TABS [id number] Yes Take 2,000 Units by mouth daily. [provider] Taking Active Spouse/Significant Other  Continuous Blood Gluc Receiver ([place]) DEVI [id number] Yes Use to check blood sugar levels as directed. [name], MD Taking Active Spouse/Significant Other  Continuous Blood Gluc Sensor ([place]) MISC [id number] Yes Use to check blood glucose levels as directed. [name], MD Taking Active Spouse/Significant Other  enoxaparin (LOVENOX) 120 MG/0.8ML injection [id number] Yes Inject 0.8 mLs (120 mg total) into the skin daily. Bayard Hugger, NP Taking Active   feeding supplement, GLUCERNA SHAKE, ([place]) LIQD [id number] Yes Take 237 mLs by mouth 3 (three) times daily between meals. Setzer, [name], PA-C Taking Active   glucose blood test strip [id number] Yes Use as instructed Burchette, Alinda Sierras, MD Taking Active Spouse/Significant Other  insulin glargine, 2 Unit Dial, (TOUJEO MAX SOLOSTAR) 300 UNIT/ML Solostar Pen [id number] Yes Inject 16 Units into the skin daily. [name], MD Taking Active   lidocaine-prilocaine (EMLA) cream [id number] Yes Apply 1 application topically as needed. Apply 1/2 tablespoon to port site 2 hours prior to stick and cover with Press-and-Seal to numb site for access  Patient taking differently: Apply 1 application. topically daily as needed. Apply 1/2 tablespoon to port site 2 hours prior to stick and cover with Press-and-Seal to numb site for access   [name], MD Taking Active Spouse/Significant Other  metFORMIN (  GLUCOPHAGE) 500 MG tablet [id number] Yes Take 1 tablet (500 mg total) by mouth 2 (two) times daily with a meal. Burchette, Alinda Sierras, MD Taking Active    ondansetron (ZOFRAN) 8 MG tablet [id number] Yes Take 1 tablet (8 mg total) by mouth every 8 (eight) hours as needed for nausea or vomiting. Start 72 hours after each IV chemotherapy administration [name], MD Taking Active Spouse/Significant Other  oxyCODONE-acetaminophen (PERCOCET/ROXICET) 5-325 MG tablet [id number] Yes Take 1 tablet by mouth every 4 (four) hours as needed for severe pain. [name], MD Taking Active   pantoprazole (PROTONIX) 20 MG tablet [id number] Yes TAKE 1 TABLET(20 MG) BY MOUTH DAILY Strength: 20 mg  Patient taking differently: TAKE 2 TABLET(20 MG) BY MOUTH DAILY Strength: 20 mg   [name], NP Taking Active   polyethylene glycol (MIRALAX / GLYCOLAX) 17 g packet [id number] Yes Take 34 g by mouth daily as needed.  Patient taking differently: Take 17 g by mouth daily as needed for mild constipation.   Elgergawy, [name], MD Taking Active Spouse/Significant Other  prochlorperazine (COMPAZINE) 10 MG tablet [id number] Yes Take 1 tablet (10 mg total) by mouth every 6 (six) hours as needed for nausea. [name], MD Taking Active Spouse/Significant Other  senna-docusate (SENOKOT-S) 8.6-50 MG tablet [id number] Yes Take 2 tablets by mouth 2 (two) times daily. Hold for diarrhea Elgergawy, [name], MD Taking Active Spouse/Significant Other  tamsulosin (FLOMAX) 0.4 MG CAPS capsule [id number] Yes Take 1 capsule (0.4 mg total) by mouth daily after supper. Setzer, [name], PA-C Taking Active   TRAVATAN Z 0.004 % SOLN ophthalmic solution [id number] Yes Place 1 drop into both eyes at bedtime. [provider] Taking Active Multiple Informants, Spouse/Significant Other            Patient Active Problem List   Diagnosis Date Noted   Gait abnormality 03/28/2022   Left arm weakness 01/23/2022   UTI (urinary tract infection) 01/23/2022   Cerebrovascular accident (CVA) due to bilateral embolism of posterior cerebral arteries ([place]) 12/30/2021   Right foot drop  12/30/2021   Bacteremia due to Klebsiella pneumoniae 12/07/2021   DVT in Left peroneal vein 12/07/2021   CVA (cerebral vascular accident) ([place]) 12/06/2021   AKI (acute kidney injury) ([place]) 12/04/2021   Hyponatremia 12/04/2021   Cancer with unknown primary site ([place]) 10/11/2021   Goals of care, counseling/discussion 10/11/2021   Chest pain 09/21/2021   Recent cerebrovascular accident (CVA) 09/21/2021   Interatrial cardiac shunt 17/49/4496   Acute embolic stroke ([place]) 75/91/6384   Mixed diabetic hyperlipidemia associated with type 2 diabetes mellitus ([place]) 09/16/2021   Liver masses 09/16/2021   Essential hypertension 03/12/2012   Type 2 diabetes mellitus with hyperglycemia ([place]) 06/15/2008   BPH (benign prostatic hyperplasia) 06/15/2008    Immunization History  Administered Date(s) Administered   Fluad Quad(high Dose 65+) 07/07/2019, 08/25/2020, 09/26/2021   Influenza Whole 08/02/2009, 08/13/2012   Influenza, High Dose Seasonal PF 08/04/2013, 07/14/2016, 08/01/2017   Influenza,inj,Quad PF,6+ Mos 07/31/2014   PFIZER Comirnaty(Gray Top)Covid-19 Tri-Sucrose Vaccine 03/09/2021   PFIZER(Purple Top)SARS-COV-2 Vaccination 11/26/2019, 12/17/2019, 08/17/2020   Pfizer Covid-19 Vaccine Bivalent Booster 46yr & up 10/12/2021   Pneumococcal Conjugate-13 06/07/2015   Pneumococcal Polysaccharide-23 10/15/2013   Td 06/15/2008   Zoster, Live 07/31/2014   Patient's wife reported that they have an aid coming out a couple times a week and is helping with showering.  Conditions to be addressed/monitored:  Hyperlipidemia, Diabetes, BPH, and Glaucoma and History of stroke  Conditions addressed this visit: History of stroke, diabetes   Care Plan : CCM Pharmacy Care Plan  Updates made by Viona Gilmore, [place] since 03/31/2022 12:00 AM     Problem: Problem: Hyperlipidemia, Diabetes, BPH, and Glaucoma      Long-Range Goal: Patient-Specific Goal   Start Date: 05/23/2021  Expected End Date:  05/23/2022  Recent Progress: On track  Priority: High  Note:   Current Barriers:  Unable to independently monitor therapeutic efficacy Suboptimal therapeutic regimen for diabetes  Pharmacist Clinical Goal(s):  Patient will achieve adherence to monitoring guidelines and medication adherence to achieve therapeutic efficacy through collaboration with PharmD and provider.   Interventions: 1:1 collaboration with [name], MD regarding development and update of comprehensive plan of care as evidenced by provider attestation and co-signature Inter-disciplinary care team collaboration (see longitudinal plan of care) Comprehensive medication review performed; medication list updated in electronic medical record  Hyperlipidemia: (LDL goal < 70) -Uncontrolled -Current treatment: Atorvastatin 40 mg 1 tablet daily - Appropriate, Effective, Safe, Accessible -Medications previously tried: pravastatin  -Current dietary patterns: eating a vegetable with every meal; does not eat much fried foods -Current exercise habits: at least 4 days a week - not going right now; doing PT right now -Educated on Cholesterol goals;  Importance of limiting foods high in cholesterol; -Counseled on diet and exercise extensively Recommended to continue current medication  Diabetes (A1c goal <8%) -Not ideally controlled -Current medications: Metformin 500 mg 1 tablet twice daily -  Appropriate, Effective, Safe, Accessible Toujeo inject 15-20 units daily - Appropriate, Effective, Safe, Accessible Humalog inject based on a sliding scale - Appropriate, Effective, Safe, Accessible -Medications previously tried: metformin (higher dose of 500 mg twice daily caused stomach upset), Januvia (switch to Rybelsus), Rybelsus (possible side effects) -Current home glucose readings fasting glucose: 87, 99, 116, 97, 93, 62 post prandial glucose: 103 -Denies hypoglycemic/hyperglycemic symptoms -Current meal patterns:   breakfast: 2% milk and cereal (raisin bran or cheerios and bananas); grits & eggs & sausage and cheese on Sundays lunch: salad sometimes; [place] and ham sandwich, potato chips dinner: picks it up Tues and Fri; chicken; catfish with potatoes and vegetables; longhorn ribeye with baked potato and salad; vegetable with dinner snacks: very seldom dessert, peanuts, dried raisins (doesn't eat after dinner) drinks: water, Gatorade, iced tea (sweet) -Current exercise: 4 days a week - on hold right now -Educated on A1c and blood sugar goals; Exercise goal of 150 minutes per week; Carbohydrate counting and/or plate method -Counseled to check feet daily and get yearly eye exams -Counseled on diet and exercise extensively Recommended to continue current medication  BPH (Goal: minimize symptoms) -Controlled -Current treatment  Tamsulosin 0.4 mg 1 capsule daily after supper - Appropriate, Effective, Safe, Accessible -Medications previously tried: none  -Recommended to continue current medication  History of stroke (Goal: prevent strokes) -Controlled -Current treatment  Lovenox inject 120 mg into the skin daily - Appropriate, Effective, Safe, Query accessible Aspirin 81 mg 1 tablet daily - Appropriate, Effective, Safe, Query accessible Atorvastatin 40 mg 1 tablet daily - Appropriate, Effective, Safe, Accessible -Medications previously tried: none  -Counseled on monitoring for bleeding and limiting use of NSAIDs.  Glaucoma (Goal: minimize intraocular pressure) -Controlled -Current treatment  Travatan Z 0.004% apply as directed - Appropriate, Effective, Safe, Accessible -Medications previously tried: none  -Recommended to continue current medication   Health Maintenance -Vaccine gaps: shingrix (patient got at the [place]), tetanus -Current therapy:  Vitamin D 2000 units 1 tablet daily Vitamin B12  1000 mcg 1 tablet daily -Educated on Cost vs benefit of each product must be carefully weighed by  individual consumer -Patient is satisfied with current therapy and denies issues -Recommended to continue current medication  Patient Goals/Self-Care Activities Patient will:  - take medications as prescribed check glucose daily, document, and provide at future appointments target a minimum of 150 minutes of moderate intensity exercise weekly  Follow Up Plan: The care management team will reach out to the patient again over the next 30 days.        Medication Assistance: None required.  Patient affirms current coverage meets needs.  Compliance/Adherence/Medication fill history: Care Gaps: Shingrix, tetanus, COVID booster, foot exam, urine microalbumin Last A1c: 8.0% 01/24/22 130/77 ( 03/28/22)  Star-Rating Drugs: Atorvastatin (Lipitor) 40 mg - Last filled 01/23/22 90 DS at [facility] Metformin 750 mg - Last filled 02/09/22 90 DS at [facility]  Patient's preferred pharmacy is:  [facility], [address] AT [address] [place] [address] Phone: [phone] Fax: [phone]  [facility] [address] [place] [address] Phone: [phone] Fax: [phone]   Uses pill box? Yes - has an AM and PM - bought a box but some are before and some are after Pt endorses 100% compliance  We discussed: Benefits of medication synchronization, packaging and delivery as well as enhanced pharmacist oversight with [facility]. Patient decided to: Continue current medication management strategy  Care Plan and Follow Up Patient Decision:  Patient agrees to Care Plan and Follow-up.  Plan: The care management team will reach out to the patient again over the next 60 days.  [name], PharmD, [place] Pharmacist [place] [facility] [place]

## 2022-03-31 NOTE — Telephone Encounter (Signed)
I spoke with Robert Warner and she stated that MD with [facility] will call the office at 12:30 pm to discuss Home health with PCP. MD on call will contact office to get connected with PCP.

## 2022-03-31 NOTE — Patient Instructions (Signed)
Hi Robert Warner and Robert Warner,  It was great to catch up with you again!  Please reach out to me if you have any questions or need anything!  Best, Maddie  [name], PharmD, [place] [place] [facility]   Visit Information   Goals Addressed   None    Patient Care Plan: CCM Pharmacy Care Plan     Problem Identified: Problem: Hyperlipidemia, Diabetes, BPH, and Glaucoma      Long-Range Goal: Patient-Specific Goal   Start Date: 05/23/2021  Expected End Date: 05/23/2022  Recent Progress: On track  Priority: High  Note:   Current Barriers:  Unable to independently monitor therapeutic efficacy Suboptimal therapeutic regimen for diabetes  Pharmacist Clinical Goal(s):  Patient will achieve adherence to monitoring guidelines and medication adherence to achieve therapeutic efficacy through collaboration with PharmD and provider.   Interventions: 1:1 collaboration with [name], MD regarding development and update of comprehensive plan of care as evidenced by provider attestation and co-signature Inter-disciplinary care team collaboration (see longitudinal plan of care) Comprehensive medication review performed; medication list updated in electronic medical record  Hyperlipidemia: (LDL goal < 70) -Uncontrolled -Current treatment: Atorvastatin 40 mg 1 tablet daily - Appropriate, Effective, Safe, Accessible -Medications previously tried: pravastatin  -Current dietary patterns: eating a vegetable with every meal; does not eat much fried foods -Current exercise habits: at least 4 days a week - not going right now; doing PT right now -Educated on Cholesterol goals;  Importance of limiting foods high in cholesterol; -Counseled on diet and exercise extensively Recommended to continue current medication  Diabetes (A1c goal <8%) -Not ideally controlled -Current medications: Metformin 500 mg 1 tablet twice daily -  Appropriate,  Effective, Safe, Accessible Toujeo inject 15-20 units daily - Appropriate, Effective, Safe, Accessible Humalog inject based on a sliding scale - Appropriate, Effective, Safe, Accessible -Medications previously tried: metformin (higher dose of 500 mg twice daily caused stomach upset), Januvia (switch to Rybelsus), Rybelsus (possible side effects) -Current home glucose readings fasting glucose: 87, 99, 116, 97, 93, 62 post prandial glucose: 103 -Denies hypoglycemic/hyperglycemic symptoms -Current meal patterns:  breakfast: 2% milk and cereal (raisin bran or cheerios and bananas); grits & eggs & sausage and cheese on Sundays lunch: salad sometimes; [place] and ham sandwich, potato chips dinner: picks it up Tues and Fri; chicken; catfish with potatoes and vegetables; longhorn ribeye with baked potato and salad; vegetable with dinner snacks: very seldom dessert, peanuts, dried raisins (doesn't eat after dinner) drinks: water, Gatorade, iced tea (sweet) -Current exercise: 4 days a week - on hold right now -Educated on A1c and blood sugar goals; Exercise goal of 150 minutes per week; Carbohydrate counting and/or plate method -Counseled to check feet daily and get yearly eye exams -Counseled on diet and exercise extensively Recommended to continue current medication  BPH (Goal: minimize symptoms) -Controlled -Current treatment  Tamsulosin 0.4 mg 1 capsule daily after supper - Appropriate, Effective, Safe, Accessible -Medications previously tried: none  -Recommended to continue current medication  History of stroke (Goal: prevent strokes) -Controlled -Current treatment  Lovenox inject 120 mg into the skin daily - Appropriate, Effective, Safe, Query accessible Aspirin 81 mg 1 tablet daily - Appropriate, Effective, Safe, Query accessible Atorvastatin 40 mg 1 tablet daily - Appropriate, Effective, Safe, Accessible -Medications previously tried: none  -Counseled on monitoring for bleeding and  limiting use of NSAIDs.  Glaucoma (Goal: minimize intraocular pressure) -Controlled -Current treatment  Travatan Z 0.004% apply as directed - Appropriate, Effective, Safe,  Accessible -Medications previously tried: none  -Recommended to continue current medication   Health Maintenance -Vaccine gaps: shingrix (patient got at the [place]), tetanus -Current therapy:  Vitamin D 2000 units 1 tablet daily Vitamin B12 1000 mcg 1 tablet daily -Educated on Cost vs benefit of each product must be carefully weighed by individual consumer -Patient is satisfied with current therapy and denies issues -Recommended to continue current medication  Patient Goals/Self-Care Activities Patient will:  - take medications as prescribed check glucose daily, document, and provide at future appointments target a minimum of 150 minutes of moderate intensity exercise weekly  Follow Up Plan: The care management team will reach out to the patient again over the next 30 days.         Patient verbalizes understanding of instructions and care plan provided today and agrees to view in [place]. Active MyChart status and patient understanding of how to access instructions and care plan via MyChart confirmed with patient.    The pharmacy team will reach out to the patient again over the next 60 days.   Viona Gilmore, [facility]

## 2022-04-02 ENCOUNTER — Telehealth: Payer: Self-pay | Admitting: Family Medicine

## 2022-04-02 DIAGNOSIS — E1165 Type 2 diabetes mellitus with hyperglycemia: Secondary | ICD-10-CM

## 2022-04-02 NOTE — Telephone Encounter (Signed)
-----   Message from Viona Gilmore, [facility] sent at 03/30/2022  3:30 PM EDT ----- Regarding: RE: CCM referral [place], my CMA called today for the appointment and his wife said she did in fact want to keep it and they did not wish to cancel. ----- Message ----- From: [name], MD Sent: 03/30/2022  12:05 PM EDT To: Viona Gilmore, RPH Subject: RE: CCM referral                               I am happy to refer but would check with his wife first to see if she thinks this might be beneficial.   I know he was in a very weakened state last time here.   ----- Message ----- From: Viona Gilmore, [place]: 03/29/2022   8:02 AM EDT To: [name], MD Subject: RE: CCM referral                               Sorry if I wasn't more specific previously with him. He is a patient that I have already seen and I just have a follow up scheduled with him but he never had a referral entered. This was what I was referring to in the provider meeting that we were required to have referrals and then [facility] said we didn't need them for the last year or so and he was one of those patients I have seen in the last year or so. I can still offer to cancel the appointment if you think that's more appropriate.  Sorry for any confusion! Thanks, Maddie ----- Message ----- From: [name], MD Sent: 03/28/2022   5:32 PM EDT To: Viona Gilmore, RPH Subject: RE: CCM referral                               He has terminal cancer and I would advise checking with wife first to see if they have any interest.  Would not want to place any undue burden on them at this point unless they have specific interest or needs. ----- Message ----- From: Viona Gilmore, [facility] Sent: 03/28/2022   1:00 PM EDT To: [name], MD Subject: CCM referral                                   Hi again,  Sorry for all of the requests! Can you please put in a CCM referral for Mr. Rallis?  Thank you! Maddie

## 2022-04-03 ENCOUNTER — Other Ambulatory Visit: Payer: Self-pay | Admitting: Oncology

## 2022-04-04 ENCOUNTER — Other Ambulatory Visit: Payer: Self-pay | Admitting: Registered Nurse

## 2022-04-04 ENCOUNTER — Telehealth: Payer: Self-pay | Admitting: Family Medicine

## 2022-04-04 ENCOUNTER — Other Ambulatory Visit: Payer: Self-pay | Admitting: Physician Assistant

## 2022-04-04 NOTE — Telephone Encounter (Signed)
I spoke with [name] and informed him of the message. [name] stated he would contact outpatient rehab and have them fax over orders for signature.

## 2022-04-04 NOTE — Telephone Encounter (Signed)
Jim OT with [facility] is calling and would like a order for [facility] OT, PT and ST due to stroke

## 2022-04-05 ENCOUNTER — Telehealth: Payer: Self-pay | Admitting: Family Medicine

## 2022-04-05 ENCOUNTER — Other Ambulatory Visit: Payer: Self-pay

## 2022-04-05 MED ORDER — ENOXAPARIN SODIUM 120 MG/0.8ML IJ SOSY: 120.0000 mg | PREFILLED_SYRINGE | INTRAMUSCULAR | 0 refills | Status: AC

## 2022-04-05 MED ORDER — ENOXAPARIN SODIUM 120 MG/0.8ML IJ SOSY: 120.0000 mg | PREFILLED_SYRINGE | INTRAMUSCULAR | 0 refills | Status: DC

## 2022-04-05 NOTE — Telephone Encounter (Signed)
-----   Message from Randall An, RN sent at 04/05/2022  2:18 PM EDT ----- Sending msg back per your request. Let me know if I can help with anything. Thank you, Larene Beach  ----- Message ----- From: [name], MD Sent: 04/04/2022   9:08 PM EDT To: Randall An, RN  Larene Beach,  Didn't know if you should be involved with this patient.   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  He might have to stay on the Lovenox (vs Coumadin) because of his cancer and ongoing clotting on coumadin. Let me know if you are not able to help with the Lovenox refills.  Thanks.  Zariyah [place] ----- Message ----- From: Charlett Blake, MD Sent: 04/04/2022   3:22 PM EDT To: [name], MD, [name], MD  I took care of this pt on the CVA rehab unit at [facility].  He discharged on Lovenox '120mg'$  per day ,we gave him an Rx but pharmacy is asking for another rx.  I notified pharmacy to contact one of you , not sure who is prescribing.   [employer]

## 2022-04-05 NOTE — Telephone Encounter (Signed)
-----   Message from Randall An, RN sent at 04/05/2022  2:18 PM EDT ----- Sending msg back per your request. Let me know if I can help with anything. Thank you, Larene Beach  ----- Message ----- From: [name], MD Sent: 04/04/2022   9:08 PM EDT To: Randall An, RN  Larene Beach,  Didn't know if you should be involved with this patient.   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  He might have to stay on the Lovenox (vs Coumadin) because of his cancer and ongoing clotting on coumadin. Let me know if you are not able to help with the Lovenox refills.  Thanks.  Nelani Schmelzle ----- Message ----- From: Charlett Blake, MD Sent: 04/04/2022   3:22 PM EDT To: [name], MD, [name], MD  I took care of this pt on the CVA rehab unit at [facility].  He discharged on Lovenox '120mg'$  per day ,we gave him an Rx but pharmacy is asking for another rx.  I notified pharmacy to contact one of you , not sure who is prescribing.   [employer]

## 2022-04-07 ENCOUNTER — Encounter: Payer: Self-pay | Admitting: Nurse Practitioner

## 2022-04-07 ENCOUNTER — Inpatient Hospital Stay (HOSPITAL_BASED_OUTPATIENT_CLINIC_OR_DEPARTMENT_OTHER): Payer: Medicare HMO | Admitting: Nurse Practitioner

## 2022-04-07 ENCOUNTER — Inpatient Hospital Stay: Payer: Medicare HMO

## 2022-04-07 ENCOUNTER — Encounter: Payer: Self-pay | Admitting: *Deleted

## 2022-04-07 VITALS — BP 116/80 | HR 84 | Temp 98.1°F | Resp 18 | Ht 71.0 in | Wt 173.6 lb

## 2022-04-07 DIAGNOSIS — Z5112 Encounter for antineoplastic immunotherapy: Secondary | ICD-10-CM | POA: Diagnosis not present

## 2022-04-07 DIAGNOSIS — C787 Secondary malignant neoplasm of liver and intrahepatic bile duct: Secondary | ICD-10-CM | POA: Diagnosis not present

## 2022-04-07 DIAGNOSIS — R7881 Bacteremia: Secondary | ICD-10-CM | POA: Diagnosis not present

## 2022-04-07 DIAGNOSIS — C801 Malignant (primary) neoplasm, unspecified: Secondary | ICD-10-CM

## 2022-04-07 DIAGNOSIS — I129 Hypertensive chronic kidney disease with stage 1 through stage 4 chronic kidney disease, or unspecified chronic kidney disease: Secondary | ICD-10-CM | POA: Diagnosis not present

## 2022-04-07 DIAGNOSIS — B9681 Helicobacter pylori [H. pylori] as the cause of diseases classified elsewhere: Secondary | ICD-10-CM | POA: Diagnosis not present

## 2022-04-07 DIAGNOSIS — Z95828 Presence of other vascular implants and grafts: Secondary | ICD-10-CM

## 2022-04-07 DIAGNOSIS — K2960 Other gastritis without bleeding: Secondary | ICD-10-CM | POA: Diagnosis not present

## 2022-04-07 DIAGNOSIS — Z5111 Encounter for antineoplastic chemotherapy: Secondary | ICD-10-CM | POA: Diagnosis not present

## 2022-04-07 DIAGNOSIS — B961 Klebsiella pneumoniae [K. pneumoniae] as the cause of diseases classified elsewhere: Secondary | ICD-10-CM | POA: Diagnosis not present

## 2022-04-07 LAB — CMP (CANCER CENTER ONLY)
ALT: 28 U/L (ref 0–44)
AST: 40 U/L (ref 15–41)
Albumin: 3.6 g/dL (ref 3.5–5.0)
Alkaline Phosphatase: 161 U/L — ABNORMAL HIGH (ref 38–126)
Anion gap: 9 (ref 5–15)
BUN: 22 mg/dL (ref 8–23)
CO2: 27 mmol/L (ref 22–32)
Calcium: 9.4 mg/dL (ref 8.9–10.3)
Chloride: 99 mmol/L (ref 98–111)
Creatinine: 1.21 mg/dL (ref 0.61–1.24)
GFR, Estimated: 60 mL/min — ABNORMAL LOW (ref >60)
Glucose, Bld: 127 mg/dL — ABNORMAL HIGH (ref 70–99)
Potassium: 4.5 mmol/L (ref 3.5–5.1)
Sodium: 135 mmol/L (ref 135–145)
Total Bilirubin: 0.6 mg/dL (ref 0.3–1.2)
Total Protein: 7.1 g/dL (ref 6.5–8.1)

## 2022-04-07 LAB — CBC WITH DIFFERENTIAL (CANCER CENTER ONLY)
Abs Immature Granulocytes: 0.01 10*3/uL (ref 0.00–0.07)
Basophils Absolute: 0 10*3/uL (ref 0.0–0.1)
Basophils Relative: 1 %
Eosinophils Absolute: 0.1 10*3/uL (ref 0.0–0.5)
Eosinophils Relative: 2 %
HCT: 32.5 % — ABNORMAL LOW (ref 39.0–52.0)
Hemoglobin: 10.5 g/dL — ABNORMAL LOW (ref 13.0–17.0)
Immature Granulocytes: 0 %
Lymphocytes Relative: 26 %
Lymphs Abs: 1.2 10*3/uL (ref 0.7–4.0)
MCH: 28.3 pg (ref 26.0–34.0)
MCHC: 32.3 g/dL (ref 30.0–36.0)
MCV: 87.6 fL (ref 80.0–100.0)
Monocytes Absolute: 0.5 10*3/uL (ref 0.1–1.0)
Monocytes Relative: 11 %
Neutro Abs: 2.7 10*3/uL (ref 1.7–7.7)
Neutrophils Relative %: 60 %
Platelet Count: 174 10*3/uL (ref 150–400)
RBC: 3.71 MIL/uL — ABNORMAL LOW (ref 4.22–5.81)
RDW: 14.4 % (ref 11.5–15.5)
WBC Count: 4.4 10*3/uL (ref 4.0–10.5)
nRBC: 0 % (ref 0.0–0.2)

## 2022-04-07 LAB — CEA (ACCESS): CEA (CHCC): 233.44 ng/mL — ABNORMAL HIGH (ref 0.00–5.00)

## 2022-04-07 LAB — MAGNESIUM: Magnesium: 1.6 mg/dL — ABNORMAL LOW (ref 1.7–2.4)

## 2022-04-07 MED ORDER — SODIUM CHLORIDE 0.9% FLUSH
10.0000 mL | INTRAVENOUS | Status: DC | PRN
  Administered 2022-04-07: 10 mL via INTRAVENOUS

## 2022-04-07 MED ORDER — HEPARIN SOD (PORK) LOCK FLUSH 100 UNIT/ML IV SOLN
500.0000 [IU] | Freq: Once | INTRAVENOUS | Status: AC
  Administered 2022-04-07: 500 [IU] via INTRAVENOUS

## 2022-04-07 NOTE — Addendum Note (Signed)
Addended by: Velna Hatchet on: 04/07/2022 09:37 AM   Modules accepted: Orders

## 2022-04-07 NOTE — Progress Notes (Signed)
Patient seen by Ned Card NP today  Vitals are within treatment parameters.  Labs reviewed by Ned Card NP and are within treatment parameters. Mg+ 1.6 Per physician team, patient will not be receiving treatment today. Disease progression

## 2022-04-07 NOTE — Progress Notes (Signed)
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  He completed another cycle of gemcitabine/cisplatin/trastuzumab 03/24/2022.  He denies nausea/vomiting.  No mouth sores.  No diarrhea.  No fever or rash after treatment.  No neuropathy symptoms.  No change in baseline hearing.  He continues to have abdominal pain.  He was evaluated in the emergency department 03/26/2022.  He had a CT scan which showed an increase in size and number of liver lesions.  Objective:  Vital signs in last 24 hours:  Blood pressure 116/80, pulse 84, temperature 98.1 F (36.7 C), temperature source Oral, resp. rate 18, height _0  (1.803 m), weight 173 lb 9.6 oz (78.7 kg), SpO2 100 %.    HEENT: No thrush or ulcers. Resp: Lungs clear bilaterally. Cardio: Regular rate and rhythm. GI: Abdomen soft and nontender.  No hepatomegaly.  No mass. Vascular: No leg edema. Port-A-Cath without erythema.  Lab Results:  Lab Results  Component Value Date   WBC 4.4 04/07/2022   HGB 10.5 (L) 04/07/2022   HCT 32.5 (L) 04/07/2022   MCV 87.6 04/07/2022   PLT 174 04/07/2022   NEUTROABS 2.7 04/07/2022    Imaging:  No results found.  Medications: I have reviewed the patient's current medications.  Assessment/Plan: Poorly differentiated adenocarcinoma involving the liver Abdominal ultrasound 08/05/2021-indeterminate 63m solid mass in the right hepatic lobe.   MRI of the liver 09/06/2021-multifocal rim-enhancing lesions throughout both lobes of the liver.  Index lesion within the lateral dome of right hepatic lobe measures 1.1 cm, segment 4A lesion measures 1.6 x 1.4 cm, segment 2 lesion measures 0.8 x 0.7 cm, posteromedial margin of the right hepatic lobe subcapsular lesion measures 2.5 x 2.0 cm 09/16/2021 CT angio neck-9 mm right upper lobe nodule Biopsy of a right liver mass on 09/23/2021.  Pathology shows poorly differentiated  adenocarcinoma positive for cytokeratin 7, CDX2 and cytokeratin 20 and negative for TTF-1, PSA and prostein.  Differential diagnoses include pancreatobiliary and less likely upper GI. HER2 positive by FISH PD-L1 combined positive score 0% MSS equivocal, tumor mutation burden 0, ERBB2 amplification equivocal 09/29/2021 CA 19-9 81, CEA 33 PET scan 10/10/2021-solitary 10 mm right upper lobe pulmonary nodule with minimal FDG uptake.  7.5 mm left internal mammary lymph node hypermetabolic with SUV max 49.73  Numerous hepatic lesions.  Periportal lymphadenopathy SUV max 8.18.  2 adjacent lesions noted in the mesentery in the right mid abdomen with somewhat irregular margins, SUV 8.09.  No hypermetabolic colonic lesion identified to suggest a primary colon cancer.  No pancreatic lesion.  No gastric lesion.  No retroperitoneal lymphadenopathy. Upper endoscopy 10/13/2021-no mass, H. pylori gastritis on gastric biopsy Cycle 1 FOLFOX 10/19/2021 Cycle 2 FOLFOX 11/02/2021, Emend added, oxaliplatin dose reduced Cycle 3 FOLFOX 11/16/2021 Cycle 4 FOLFOX 11/30/2021 CT abdomen/pelvis with contrast on 12/05/2021-stable exam with bilobar hepatic masses, findings suggestive of metastatic gallbladder cancer. CT 01/25/2022-new and enlarging hepatic metastases, abnormal masslike appearance at the gallbladder and porta hepatis, stable left internal mammary node, increased left supraclavicular node, increased size of right upper lobe nodule Guardant360 02/17/2022-MSI high not detected,PTEN, RB1, GATA3 alterations Cycle 1 gemcitabine/cisplatin and trastuzumab for 02/22/2022 Cycle 2 gemcitabine/cisplatin and trastuzumab 03/09/2022 Cycle 3 gemcitabine/cisplatin and trastuzumab 03/24/2022 03/26/2022 emergency room evaluation for increased abdominal pain-CT abdomen/pelvis 03/26/2022 with numerous hypodense liver lesions increased in size and number, unchanged soft tissue nodule within the mesentery of the right colon, contracted  gallbladder Hospital admission 09/16/2021 -  09/19/2021 with gait disturbance-multifocal embolic stroke on MRI 68/01/4195, echo with interatrial shunt, transcranial Doppler with bubble study indicative of a medium size right to left shunt, lower extremity Doppler studies negative for DVT Hospital admission 09/20/2021 - 09/22/2021 with chest pain-CT coronary with no acute findings.   Diabetes Hypertension BPH Chronic kidney disease Brain CT 11/18/2021-new "lesion" in the left cerebellum when compared to brain MRI 09/16/2021.  Appearance of rapid development suspicious for a subacute infarct.  Metastatic focus not excluded.  Follow-up brain MRI with contrast recommended in the next 4 to 6 weeks. [facility] admission 01/04/9797-[id number] embolic CVAs, Heparin then apixaban anticoagulation 10.  Klebsiella bacteremia 12/05/2021-no apparent source for infection 11.  Thrombocytopenia secondary to chemotherapy and bacteremia 12.  Left peroneal DVT 12/05/2021 13.  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  Recent CT scan shows evidence of progression.  In addition there has been no improvement in his pain.  Dr. Benay Warner reviewed the CT report/images with Dr. [name] and his wife at today's visit and recommends discontinuing the current treatment.  We discussed focusing on comfort/quality of life.  He would like to return next week for additional discussion.  For pain he will try taking 2 Percocet tablets.  If this is not effective he will contact the office.  He will  return for a follow-up visit 04/14/2022.  We are available to see him sooner if needed.  Patient seen with Dr. Benay Warner.    Ned Card ANP/GNP-BC   04/07/2022  9:00 AM  This was a shared visit.  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  He has tolerated treatment well.  His abdominal pain has progressed.  A CT obtained in the emergency room 03/26/2022 reveals evidence of disease progression in the liver.  We discussed the CT findings and reviewed the images with [name] and his wife.  I recommend discontinuing gemcitabine/cisplatin/trastuzumab.  We will follow-up on the serum tumor markers from today.  We discussed comfort care/hospice.  He will return for an office visit next week.  I was present for greater than 50% of today's visit.  I performed medical decision making.  Robert Manson, MD

## 2022-04-07 NOTE — Patient Instructions (Signed)
Heparin injection ?What is this medication? ?HEPARIN (HEP a rin) is an anticoagulant. It is used to treat or prevent clots in the veins, arteries, lungs, or heart. It stops clots from forming or getting bigger. This medicine prevents clotting during open-heart surgery, dialysis, or in patients who are confined to bed. ?This medicine may be used for other purposes; ask your health care provider or pharmacist if you have questions. ?COMMON BRAND NAME(S): Hep-Lock, Hep-Lock U/P, Hepflush-10, Monoject Prefill Advanced Heparin Lock Flush, SASH Normal Saline and Heparin ?What should I tell my care team before I take this medication? ?They need to know if you have any of these conditions: ?bleeding disorders, such as hemophilia or low blood platelets ?bowel disease or diverticulitis ?endocarditis ?high blood pressure ?liver disease ?recent surgery or delivery of a baby ?stomach ulcers ?an unusual or allergic reaction to heparin, benzyl alcohol, sulfites, other medicines, foods, dyes, or preservatives ?pregnant or trying to get pregnant ?breast-feeding ?How should I use this medication? ?This medicine is given by injection or infusion into a vein. It can also be given by injection of small amounts under the skin. It is usually given by a health care professional in a hospital or clinic setting. ?If you get this medicine at home, you will be taught how to prepare and give this medicine. Use exactly as directed. Take your medicine at regular intervals. Do not take it more often than directed. Do not stop taking except on your doctor's advice. Stopping this medicine may increase your risk of a blot clot. Be sure to refill your prescription before you run out of medicine. ?It is important that you put your used needles and syringes in a special sharps container. Do not put them in a trash can. If you do not have a sharps container, call your pharmacist or healthcare provider to get one. ?Talk to your pediatrician regarding the  use of this medicine in children. While this medicine may be prescribed for children for selected conditions, precautions do apply. ?Overdosage: If you think you have taken too much of this medicine contact a poison control center or emergency room at once. ?NOTE: This medicine is only for you. Do not share this medicine with others. ?What if I miss a dose? ?If you miss a dose, take it as soon as you can. If it is almost time for your next dose, take only that dose. Do not take double or extra doses. ?What may interact with this medication? ?Do not take this medicine with any of the following medications: ?aspirin and aspirin-like drugs ?mifepristone ?medicines that treat or prevent blood clots like warfarin, enoxaparin, and dalteparin ?palifermin ?protamine ?This medicine may also interact with the following medications: ?dextran ?digoxin ?hydroxychloroquine ?medicines for treating colds or allergies ?nicotine ?NSAIDs, medicines for pain and inflammation, like ibuprofen or naproxen ?phenylbutazone ?tetracycline antibiotics ?This list may not describe all possible interactions. Give your health care provider a list of all the medicines, herbs, non-prescription drugs, or dietary supplements you use. Also tell them if you smoke, drink alcohol, or use illegal drugs. Some items may interact with your medicine. ?What should I watch for while using this medication? ?Visit your healthcare professional for regular checks on your progress. You may need blood work done while you are taking this medicine. Your condition will be monitored carefully while you are receiving this medicine. It is important not to miss any appointments. ?Wear a medical ID bracelet or chain, and carry a card that describes your disease and details   of your medicine and dosage times. ?Notify your doctor or healthcare professional at once if you have cold, blue hands or feet. ?If you are going to need surgery or other procedure, tell your healthcare  professional that you are using this medicine. ?Avoid sports and activities that might cause injury while you are using this medicine. Severe falls or injuries can cause unseen bleeding. Be careful when using sharp tools or knives. Consider using an electric razor. Take special care brushing or flossing your teeth. Report any injuries, bruising, or red spots on the skin to your healthcare professional. ?Using this medicine for a long time may weaken your bones and increase the risk of bone fractures. ?You should make sure that you get enough calcium and vitamin D while you are taking this medicine. Discuss the foods you eat and the vitamins you take with your healthcare professional. ?Wear a medical ID bracelet or chain. Carry a card that describes your disease and details of your medicine and dosage times. ?What side effects may I notice from receiving this medication? ?Side effects that you should report to your doctor or health care professional as soon as possible: ?allergic reactions like skin rash, itching or hives, swelling of the face, lips, or tongue ?bone pain ?fever, chills ?nausea, vomiting ?signs and symptoms of bleeding such as bloody or black, tarry stools; red or dark-brown urine; spitting up blood or brown material that looks like coffee grounds; red spots on the skin; unusual bruising or bleeding from the eye, gums, or nose ?signs and symptoms of a blood clot such as chest pain; shortness of breath; pain, swelling, or warmth in the leg ?signs and symptoms of a stroke such as changes in vision; confusion; trouble speaking or understanding; severe headaches; sudden numbness or weakness of the face, arm or leg; trouble walking; dizziness; loss of coordination ?Side effects that usually do not require medical attention (report to your doctor or health care professional if they continue or are bothersome): ?hair loss ?pain, redness, or irritation at site where injected ?This list may not describe all  possible side effects. Call your doctor for medical advice about side effects. You may report side effects to FDA at 1-800-FDA-1088. ?Where should I keep my medication? ?Keep out of the reach of children. ?Store unopened vials at room temperature between 15 and 30 degrees C (59 and 86 degrees F). Do not freeze. Do not use if solution is discolored or particulate matter is present. Throw away any unused medicine after the expiration date. ?NOTE: This sheet is a summary. It may not cover all possible information. If you have questions about this medicine, talk to your doctor, pharmacist, or health care provider. ?? 2023 Elsevier/Gold Standard (2020-12-09 00:00:00) ? ?

## 2022-04-10 LAB — CANCER ANTIGEN 19-9: CA 19-9: 288 U/mL — ABNORMAL HIGH (ref 0–35)

## 2022-04-12 ENCOUNTER — Ambulatory Visit: Payer: Medicare HMO | Attending: Family Medicine

## 2022-04-12 DIAGNOSIS — R41841 Cognitive communication deficit: Secondary | ICD-10-CM | POA: Insufficient documentation

## 2022-04-12 NOTE — Therapy (Signed)
OUTPATIENT SPEECH LANGUAGE PATHOLOGY EVALUATION   Patient Name: Robert Warner MRN: [id number] DOB:1941/05/10, 81 y.o., male Today's Date: 04/13/2022  PCP: [name], MD REFERRING PROVIDER: [name], MD   End of Session - 04/12/22 2354     Visit Number 1    Number of Visits 25    Date for SLP Re-Evaluation 07/11/22    Authorization Type humana medicare    SLP Start Time 02-23-02    SLP Stop Time  02/23/1146    SLP Time Calculation (min) 44 min    Activity Tolerance Patient tolerated treatment well             Past Medical History:  Diagnosis Date   Arthritis    Cancer ([place])    Cataract    Diabetes mellitus    Hyperlipidemia    Stroke [facility])    Past Surgical History:  Procedure Laterality Date   APPENDECTOMY  February 24, 1967   CATARACT EXTRACTION Bilateral 07/2000   states 2 weeks ago (first of sept) OD due in Dec    COLONOSCOPY  02/24/03   Negative, Dr. Sharlett Iles    IR IMAGING GUIDED PORT INSERTION  10/14/2021   KIDNEY STONE SURGERY  10/2018   PROSTATE BIOPSY  2005/02/23   Dr.Sigmund Tannebaum   Patient Active Problem List   Diagnosis Date Noted   Gait abnormality 03/28/2022   Left arm weakness 01/23/2022   UTI (urinary tract infection) 01/23/2022   Cerebrovascular accident (CVA) due to bilateral embolism of posterior cerebral arteries ([place]) 12/30/2021   Right foot drop 12/30/2021   Bacteremia due to Klebsiella pneumoniae 12/07/2021   DVT in Left peroneal vein 12/07/2021   CVA (cerebral vascular accident) ([place]) 12/06/2021   AKI (acute kidney injury) ([place]) 12/04/2021   Hyponatremia 12/04/2021   Cancer with unknown primary site [facility]) 10/11/2021   Goals of care, counseling/discussion 10/11/2021   Chest pain 09/21/2021   Recent cerebrovascular accident (CVA) 09/21/2021   Interatrial cardiac shunt 88/50/2774   Acute embolic stroke ([place]) 12/87/8676   Mixed diabetic hyperlipidemia associated with type 2 diabetes mellitus ([place]) 09/16/2021   Liver masses 09/16/2021    Essential hypertension 03/12/2012   Type 2 diabetes mellitus with hyperglycemia ([place]) 06/15/2008   BPH (benign prostatic hyperplasia) 06/15/2008    ONSET DATE: Wife reports more cognitive decline after CVA in January 2023.  REFERRING DIAG: I63.9 (ICD-10-CM) - CVA (cerebral vascular accident) ([place]), Metastatic adenocarcinoma   THERAPY DIAG:  Cognitive communication deficit  Rationale for Evaluation and Treatment Rehabilitation  SUBJECTIVE:   SUBJECTIVE STATEMENT: [name] states to SLP cognitive communication deficits were much more pronounced after 2022-02-23 CVA episode.  Pt accompanied by: significant other wife- [name]  PERTINENT HISTORY:  Pt with hx of treatment adenocarcinoma. Multiple CVAs ID'd from November 2022 to 02/23/22. Pt's last inpatient rehab ST note dated 02-08-22: "Skilled ST treatment focused on cognitive goals. SLP re-administered the [place] (SLUMS). Patient scored a 16/30 points with 27 or above considered within the normal range. Score suggestive of mild improvements, as pt scored 14/30 points on initial evaluation obtained on 3/21. Patient continues to exhibit decreased sustained attention, processing, problem solving, and intellectual awareness which appears to fluctuate with fatigue." Pt had approx 8 weeks of home health ST until last week, coming 1-2 times/week.   PAIN:  Are you having pain? No   FALLS: Has patient fallen in last 6 months?  See PT evaluation for details  LIVING ENVIRONMENT: Lives with: lives with their spouse Lives  in: House/apartment  PLOF:  Level of assistance: Needed assistance with ADLs, Needed assistance with IADLS Employment: Retired   PATIENT GOALS  Improve anything possible.  OBJECTIVE:   DIAGNOSTIC FINDINGS:  MR HEAD (01/24/22)  IMPRESSION: 1. Multiple, mostly punctate acute infarcts in the right-greater-than-left cerebral hemispheres and left cerebellar hemisphere, with a larger area of  infarction in the posterior right frontal lobe. Given multiple vascular territories an embolic etiology is favored. 2. Area of enhancement in the left cerebellar hemisphere, which is favored to represent a subacute infarct.  COGNITION: Overall cognitive status: Impaired: Attention: Impaired: Sustained (pt noted lose focus after 90 seconds on clock draw), Selective (suspect internal distraction), Alternating, Divided, Memory: Impaired: Immediate, Working, Short term (pt did not recall home health SLP's name, nor any dates of CVAs/hospitalizations), Procedural (wife stated pt can no longer operate TV remote), Awareness: Impaired: Intellectual (pt did not name any deficits, and demonstrated left inattention/neglect? On clock draw and symbol cancel), Emergent (unaware of errors on symbol cancel or clock draw), and Anticipatory, and Executive function: Impaired: Initiation (require cues for initiation to leave ST room after verbal cues for session end), Problem solving, Organization, Planning, Error awareness and self correction (did not correct errors on clock draw), and Slow processing (SLP had to repeat instructions for symbol cancel task, and pt req'd 8 seconds to name "hammer").   COGNITIVE COMMUNICATION Following directions: Follows one step commands inconsistently - req'd cues for instructions on symbol cancel and clock draw - likely attention based deficits. Auditory comprehension: Impaired: see  "following directions" above Verbal expression: WFL Functional communication: WFL  ORAL MOTOR EXAMINATION: WFL when pt understood directions  STANDARDIZED ASSESSMENTS: CLQT: initiated today - to be completed in first two therapy sessions   PATIENT REPORTED OUTCOME MEASURES (PROM): To be completed in first two therapy sessions   TODAY'S TREATMENT:  SLP spent time discussing eval results thus far and educated wife and pt on the major deficit at this time is sustained attention and that most of pt's  skilled intervention will be targeting attention.   PATIENT EDUCATION: Education details: See above in "today's treatment" Person educated: Patient and Spouse Education method: Explanation and Demonstration Education comprehension: verbalized understanding, needs further education, and (wife verbalized understanding, and [place] will require further education.   GOALS: Goals reviewed with patient? Yes  SHORT TERM GOALS: Target date: 05/11/2022    Pt will perform a functional task for 3 minutes without evidence of distraction, in 3 sessions Baseline: Goal status: INITIAL  2.   Pt will perform a functional task for 6 minutes with rare min A, in 3 sessions Baseline:  Goal status: INITIAL  3.  Wife/family will demo appropriate cueing back to task when pt exhibits off-task behavior, in 3 sessions Baseline:  Goal status: INITIAL  4.  Pt family will tell SLP 5 tasks to do at home that could improve pt's attention, in 2 sessions Baseline:  Goal status: INITIAL  5.  Pt family will explain modifications to pt environment to reduce distractions which could improve pt's attention, in 2 sessions Baseline:  Goal status: INITIAL   LONG TERM GOALS: Target date: 07/11/22    Pt will perform a functional task for 8 minutes with rare min A, in 3 sessions Baseline:  Goal status: INITIAL  2.  Pt will tindicate knowledge of 3 deficit areas in response to SLP yes/no questions Baseline:  Goal status: INITIAL  3.  Pt will demo knowledge of a simple memory system Baseline:  Goal  status: INITIAL  4.  Pt will turn to appropriate section in a 2-3 section memory system 80% of the time Baseline:  Goal status: INITIAL  5.  Pt will tell family it is time for his medication with the use of alarms, for 5/7 days  Baseline:  Goal status: INITIAL  ASSESSMENT:  CLINICAL IMPRESSION: Patient is a 81 y.o. male who was seen today for assessment of cognitive linguistic skills. CLQT was initiated and will  be completed by second therapy session. Pt showed significant deficits in all areas of cognitive linguistics stemming from dense deficits in attention and awareness. Pt will benefit from skilled ST to rehabilitate these deficits. Primary focus of therapy will be for pt's sustained and selective attention. SLP suspects pt is affected by internal and external distraction.  OBJECTIVE IMPAIRMENTS include attention, memory, awareness, executive functioning, and aphasia. These impairments are limiting patient from managing medications, managing appointments, managing finances, household responsibilities, ADLs/IADLs, and effectively communicating at home and in community. Factors affecting potential to achieve goals and functional outcome are ability to learn/carryover information, cooperation/participation level, previous level of function, and severity of impairments.. Patient will benefit from skilled SLP services to address above impairments and improve overall function.  REHAB POTENTIAL: Fair due to pt's low level of attention and awareness/insight  PLAN: SLP FREQUENCY: 2x/week  SLP DURATION: 12 weeks  PLANNED INTERVENTIONS: Environmental controls, Cueing hierachy, Cognitive reorganization, Internal/external aids, Multimodal communication approach, SLP instruction and feedback, Compensatory strategies, and Patient/family education    [facility], [place] 04/13/2022, 9:57 AM

## 2022-04-12 NOTE — Therapy (Incomplete)
OUTPATIENT SPEECH LANGUAGE PATHOLOGY EVALUATION   Patient Name: Robert Warner MRN: [id number] DOB:23-May-1941, 81 y.o., male Today's Date: 04/12/2022  PCP: [name], MD REFERRING PROVIDER: [name], MD   End of Session - 04/12/22 2354     Visit Number 1    Number of Visits 25    Date for SLP Re-Evaluation 07/11/22    Authorization Type humana medicare    SLP Start Time 1102-02-18    SLP Stop Time  1147    SLP Time Calculation (min) 44 min    Activity Tolerance Patient tolerated treatment well             Past Medical History:  Diagnosis Date  . Arthritis   . Cancer ([place])   . Cataract   . Diabetes mellitus   . Hyperlipidemia   . Stroke [facility])    Past Surgical History:  Procedure Laterality Date  . APPENDECTOMY  1968  . CATARACT EXTRACTION Bilateral 07/2000   states 2 weeks ago (first of sept) OD due in Dec   . COLONOSCOPY  6948,5462   Negative, Dr. Sharlett Iles   . IR IMAGING GUIDED PORT INSERTION  10/14/2021  . KIDNEY STONE SURGERY  10/2018  . PROSTATE BIOPSY  February 18, 2005   Dr.Sigmund Tannebaum   Patient Active Problem List   Diagnosis Date Noted  . Gait abnormality 03/28/2022  . Left arm weakness 01/23/2022  . UTI (urinary tract infection) 01/23/2022  . Cerebrovascular accident (CVA) due to bilateral embolism of posterior cerebral arteries ([place]) 12/30/2021  . Right foot drop 12/30/2021  . Bacteremia due to Klebsiella pneumoniae 12/07/2021  . DVT in Left peroneal vein 12/07/2021  . CVA (cerebral vascular accident) ([place]) 12/06/2021  . AKI (acute kidney injury) ([place]) 12/04/2021  . Hyponatremia 12/04/2021  . Cancer with unknown primary site ([place]) 10/11/2021  . Goals of care, counseling/discussion 10/11/2021  . Chest pain 09/21/2021  . Recent cerebrovascular accident (CVA) 09/21/2021  . Interatrial cardiac shunt 09/21/2021  . Acute embolic stroke ([place]) 70/35/0093  . Mixed diabetic hyperlipidemia associated with type 2 diabetes mellitus ([place]) 09/16/2021  .  Liver masses 09/16/2021  . Essential hypertension 03/12/2012  . Type 2 diabetes mellitus with hyperglycemia ([place]) 06/15/2008  . BPH (benign prostatic hyperplasia) 06/15/2008    ONSET DATE: Wife reports more cognitive decline after CVA in January 2023.  REFERRING DIAG: I63.9 (ICD-10-CM) - CVA (cerebral vascular accident) ([place]), Metastatic adenocarcinoma   THERAPY DIAG:  Cognitive communication deficit  Rationale for Evaluation and Treatment Rehabilitation  SUBJECTIVE:   SUBJECTIVE STATEMENT: [name] states to SLP cognitive communication deficits were much more pronounced after 2022/02/18 CVA episode.  Pt accompanied by: significant other wife- [name]  PERTINENT HISTORY:  Pt with hx of treatment adenocarcinoma. Multiple CVAs ID'd from November 2022 to Feb 18, 2022. Pt's last inpatient rehab ST note dated 02-08-22: "Skilled ST treatment focused on cognitive goals. SLP re-administered the [place] (SLUMS). Patient scored a 16/30 points with 27 or above considered within the normal range. Score suggestive of mild improvements, as pt scored 14/30 points on initial evaluation obtained on 3/21. Patient continues to exhibit decreased sustained attention, processing, problem solving, and intellectual awareness which appears to fluctuate with fatigue." Pt had approx 8 weeks of home health ST until last week, coming 1-2 times/week.   PAIN:  Are you having pain? No   FALLS: Has patient fallen in last 6 months?  See PT evaluation for details  LIVING ENVIRONMENT: Lives with: lives with their spouse Lives  in: House/apartment  PLOF:  Level of assistance: Needed assistance with ADLs, Needed assistance with IADLS Employment: Retired   PATIENT GOALS  Improve anything possible.  OBJECTIVE:   DIAGNOSTIC FINDINGS: ***  COGNITION: Overall cognitive status: {cognition:24006}  COGNITIVE COMMUNICATION Following directions: {commands:24018}  Auditory comprehension:  {WFL-Impaired:25365} Verbal expression: {WFL-Impaired:25365} Functional communication: {WFL-Impaired:25365}  ORAL MOTOR EXAMINATION Facial : Symmetry impaired: {OM facial:25656} Lingual: {OM Lingual:25664} Velum: {OM Velum:25665} Mandible: {OM mandible:25667} Cough: {OM Cough:25660} Voice: {OM Voice:25662}  STANDARDIZED ASSESSMENTS: {SLPstandardizedassessment:27092}   PATIENT REPORTED OUTCOME MEASURES (PROM): {SLPPROM:27095}   TODAY'S TREATMENT:  ***   PATIENT EDUCATION: Education details: *** Person educated: {Person educated:25204} Education method: {Education Method:25205} Education comprehension: {Education Comprehension:25206}     GOALS: Goals reviewed with patient? {yes/no:20286}  SHORT TERM GOALS: Target date: {follow up:25551}  (Remove Blue Hyperlink)  *** Baseline: Goal status: {GOALSTATUS:25110}  2.  *** Baseline:  Goal status: {GOALSTATUS:25110}  3.  *** Baseline:  Goal status: {GOALSTATUS:25110}  4.  *** Baseline:  Goal status: {GOALSTATUS:25110}  5.  *** Baseline:  Goal status: {GOALSTATUS:25110}  6.  *** Baseline:  Goal status: {GOALSTATUS:25110}  LONG TERM GOALS: Target date: {follow up:25551}  (Remove Blue Hyperlink)  *** Baseline:  Goal status: {GOALSTATUS:25110}  2.  *** Baseline:  Goal status: {GOALSTATUS:25110}  3.  *** Baseline:  Goal status: {GOALSTATUS:25110}  4.  *** Baseline:  Goal status: {GOALSTATUS:25110}  5.  *** Baseline:  Goal status: {GOALSTATUS:25110}  6.  *** Baseline:  Goal status: {GOALSTATUS:25110}  ASSESSMENT:  CLINICAL IMPRESSION: Patient is a *** y.o. *** who was seen today for ***.   OBJECTIVE IMPAIRMENTS include {SLPOBJIMP:27107}. These impairments are limiting patient from {SLPLIMIT:27108}. Factors affecting potential to achieve goals and functional outcome are {SLP factors:25450}.. Patient will benefit from skilled SLP services to address above impairments and improve overall  function.  REHAB POTENTIAL: {rehabpotential:25112}  PLAN: SLP FREQUENCY: {rehab frequency:25116}  SLP DURATION: {rehab duration:25117}  PLANNED INTERVENTIONS: {SLP treatment/interventions:25449}    Kasheena Sambrano, [name] 04/12/2022, 11:55 PM

## 2022-04-13 ENCOUNTER — Ambulatory Visit: Payer: Medicare HMO | Attending: Family Medicine

## 2022-04-13 DIAGNOSIS — R293 Abnormal posture: Secondary | ICD-10-CM | POA: Insufficient documentation

## 2022-04-13 DIAGNOSIS — R2689 Other abnormalities of gait and mobility: Secondary | ICD-10-CM | POA: Diagnosis not present

## 2022-04-13 DIAGNOSIS — R278 Other lack of coordination: Secondary | ICD-10-CM | POA: Diagnosis not present

## 2022-04-13 DIAGNOSIS — R41840 Attention and concentration deficit: Secondary | ICD-10-CM | POA: Diagnosis not present

## 2022-04-13 DIAGNOSIS — R41841 Cognitive communication deficit: Secondary | ICD-10-CM | POA: Diagnosis not present

## 2022-04-13 DIAGNOSIS — I69354 Hemiplegia and hemiparesis following cerebral infarction affecting left non-dominant side: Secondary | ICD-10-CM | POA: Insufficient documentation

## 2022-04-13 DIAGNOSIS — R2681 Unsteadiness on feet: Secondary | ICD-10-CM | POA: Diagnosis not present

## 2022-04-13 DIAGNOSIS — M6281 Muscle weakness (generalized): Secondary | ICD-10-CM | POA: Diagnosis not present

## 2022-04-13 DIAGNOSIS — I63433 Cerebral infarction due to embolism of bilateral posterior cerebral arteries: Secondary | ICD-10-CM | POA: Diagnosis not present

## 2022-04-13 NOTE — Therapy (Signed)
OUTPATIENT PHYSICAL THERAPY NEURO EVALUATION   Patient Name: Robert Warner MRN: [id number] DOB:[birth year], 81 y.o., male Today's Date: 04/13/2022   PCP: none listed REFERRING PROVIDER: [name], MD    PT End of Session - 04/13/22 1634     Visit Number 1    Number of Visits 16    Date for PT Re-Evaluation 06/08/22    Authorization Type [employer] 2023    Authorization Time Period auth required via Cohere    PT Start Time 1400    PT Stop Time 8341    PT Time Calculation (min) 45 min    Equipment Utilized During Treatment Gait belt    Activity Tolerance Patient tolerated treatment well    Behavior During Therapy WFL for tasks assessed/performed             Past Medical History:  Diagnosis Date   Arthritis    Cancer ([place])    Cataract    Diabetes mellitus    Hyperlipidemia    Stroke [facility])    Past Surgical History:  Procedure Laterality Date   APPENDECTOMY  1968   CATARACT EXTRACTION Bilateral 07/2000   states 2 weeks ago (first of sept) OD due in Dec    COLONOSCOPY  2004,2009   Negative, Dr. Sharlett Iles    IR IMAGING GUIDED PORT INSERTION  10/14/2021   KIDNEY STONE SURGERY  10/2018   PROSTATE BIOPSY  2006   Dr.Sigmund Tannebaum   Patient Active Problem List   Diagnosis Date Noted   Gait abnormality 03/28/2022   Left arm weakness 01/23/2022   UTI (urinary tract infection) 01/23/2022   Cerebrovascular accident (CVA) due to bilateral embolism of posterior cerebral arteries ([place]) 12/30/2021   Right foot drop 12/30/2021   Bacteremia due to Klebsiella pneumoniae 12/07/2021   DVT in Left peroneal vein 12/07/2021   CVA (cerebral vascular accident) ([place]) 12/06/2021   AKI (acute kidney injury) ([place]) 12/04/2021   Hyponatremia 12/04/2021   Cancer with unknown primary site ([place]) 10/11/2021   Goals of care, counseling/discussion 10/11/2021   Chest pain 09/21/2021   Recent cerebrovascular accident (CVA) 09/21/2021   Interatrial cardiac shunt 96/22/2979    Acute embolic stroke ([place]) 89/21/1941   Mixed diabetic hyperlipidemia associated with type 2 diabetes mellitus ([place]) 09/16/2021   Liver masses 09/16/2021   Essential hypertension 03/12/2012   Type 2 diabetes mellitus with hyperglycemia ([place]) 06/15/2008   BPH (benign prostatic hyperplasia) 06/15/2008    ONSET DATE: March 2023  REFERRING DIAG: I63.9 (ICD-10-CM) - CVA (cerebral vascular accident) ([place])   THERAPY DIAG:  Muscle weakness (generalized)  Unsteadiness on feet  Other abnormalities of gait and mobility  Rationale for Evaluation and Treatment Rehabilitation  SUBJECTIVE:  SUBJECTIVE STATEMENT: Pt with hx of multiple strokes with most recent one being March 2023. Pt has been using RW since this stroke where previously he was using cane. Of note, he has also been battling cancer of unknown origin and has been suffering ill effects of chemotherapy. Notes continued difficulty with walking and balance issues as well as general fatigue Pt accompanied by: significant other  PERTINENT HISTORY: Multiple small embolic stroke             Recurrent multiple small embolic stroke again in March 2023,             CT angiogram of head and neck showed no large vessel disease,             Vascular risk factors of poorly controlled diabetes, A1c 11.9 in January, 8.0 in March 2023, LDL 85,             His stroke is clearly related to hypercoagulable status due to his metastatic cancer,             He was on Eliquis, was switched to Lovenox,  PAIN:  Are you having pain? No  PRECAUTIONS: None  WEIGHT BEARING RESTRICTIONS No  FALLS: Has patient fallen in last 6 months? No  LIVING ENVIRONMENT: Lives with: lives with their family and lives with their spouse Lives in: House/apartment Stairs: Yes: External:  4 steps; on right going up Has following equipment at home: Single point cane and Walker - 2 wheeled  PLOF: Independent, Independent with basic ADLs, and Independent with household mobility with device  PATIENT GOALS Hopefully return to walking with a cane  OBJECTIVE:   COGNITION: Overall cognitive status: Impaired: Attention: Impaired: Focused, Sustained, Divided, Memory: Impaired: Immediate Procedural, and Awareness: Impaired: Anticipatory   SENSATION: Light touch: Impaired  Proprioception: Impaired   COORDINATION: Difficulty with rapid alternating movements    MUSCLE TONE: no spasticity present, possibly some hypotonia LLE     POSTURE: No Significant postural limitations  LOWER EXTREMITY ROM:    No limitations noted  LOWER EXTREMITY MMT:    MMT Right Eval Left Eval  Hip flexion 5 3+  Hip extension    Hip abduction 5 3+  Hip adduction 5 4  Hip internal rotation    Hip external rotation    Knee flexion    Knee extension 5 3+  Ankle dorsiflexion 5 4  Ankle plantarflexion    Ankle inversion    Ankle eversion    (Blank rows = not tested)  BED MOBILITY:  Independent  TRANSFERS: Assistive device utilized: Environmental consultant - 2 wheeled  Sit to stand: Modified independence Stand to sit: Modified independence Chair to chair: Modified independence Floor:  DNT   STAIRS:  Level of Assistance: SBA  Stair Negotiation Technique: Step to Pattern with Single Rail on Right  Number of Stairs: 4   Height of Stairs: 4-6  Comments: decreased speed  GAIT: Gait pattern: step through pattern and poor foot clearance- Left Distance walked: 150 Assistive device utilized: Walker - 2 wheeled Level of assistance: SBA Comments:   FUNCTIONAL TESTs:    [facility] PT Assessment - 04/13/22 0001       Balance   Balance Assessed Yes      Static Standing Balance   Static Standing - Comment/# of Minutes --      Standardized Balance Assessment   Standardized Balance Assessment Berg  Balance Test;Timed Up and Go Test;Five Times Sit to Stand    Five times sit to  stand comments  19.37      Berg Balance Test   Sit to Stand Able to stand  independently using hands    Standing Unsupported Able to stand safely 2 minutes    Sitting with Back Unsupported but Feet Supported on Floor or Stool Able to sit safely and securely 2 minutes    Stand to Sit Sits safely with minimal use of hands    Transfers Able to transfer safely, minor use of hands    Standing Unsupported with Eyes Closed Able to stand 10 seconds with supervision    Standing Unsupported with Feet Together Able to place feet together independently and stand for 1 minute with supervision    From Standing, Reach Forward with Outstretched Arm Can reach forward >12 cm safely (5")    From Standing Position, Pick up Object from Floor Able to pick up shoe, needs supervision    From Standing Position, Turn to Look Behind Over each Shoulder Turn sideways only but maintains balance    Turn 360 Degrees Needs close supervision or verbal cueing    Standing Unsupported, Alternately Place Feet on Step/Stool Able to complete >2 steps/needs minimal assist    Standing Unsupported, One Foot in [employer] balance while stepping or standing    Standing on One Leg Tries to lift leg/unable to hold 3 seconds but remains standing independently    Total Score 36      Timed Up and Go Test   Normal TUG (seconds) 26.1             M-CTSIB  Condition 1: Firm Surface, EO 30 Sec, Mild Sway  Condition 2: Firm Surface, EC 30 Sec, Moderate Sway  Condition 3: Foam Surface, EO 20 Sec, Severe Sway  Condition 4: Foam Surface, EC 5 Sec,  LOB  Sway    PATIENT SURVEYS:    TODAY'S TREATMENT:  PT evaluation/assessment Tandem stance 3x30 sec at counter Marching 3x10 at counter   PATIENT EDUCATION: Education details: fall risk assessments and HEP initiation Person educated: Patient and Spouse Education method: Holiday representative Education comprehension: verbalized understanding   HOME EXERCISE PROGRAM: Tandem stance 3x30 sec at counter March at counter 3x10    GOALS: Goals reviewed with patient? Yes  SHORT TERM GOALS: Target date: 05/11/2022  Patient will be independent in HEP to improve functional outcomes Baseline: Goal status: INITIAL  2.  Patient will demonstrate score 45/56 Berg Balance Test to manifest low risk for falls Baseline: 36/56 Goal status: INITIAL    LONG TERM GOALS: Target date: 06/08/2022  Demonstrate improved BLE strength and balance as evidenced by time of 15 sec for 5xSTS test Baseline: 19+ sec Goal status: INITIAL  2.  Patient will achieve 12 seconds for TUG test to manifest reduced risk for falls Baseline: 23 w/ RW Goal status: INITIAL  3.  Demonstrate improved proprioception and static balance as evidenced by ability to maintain minimal sway condition 4 M-CTSIB Baseline: complete LOB Goal status: INITIAL    ASSESSMENT:  CLINICAL IMPRESSION: Patient is a 81 y.o. gentleman who was seen today for physical therapy evaluation and treatment for generalized weakness, unsteadiness on feet, difficulty walking and high risk for falls as a result of recent hx of CVA and demonstrates ipsilateral weakness, balance impairments, safety awareness deficits, and high risk for falls per various tests and measures.  Patient would benefit from PT services to improve independence and safety with mobility to reduce risk for falls and to train/instruct in applicable compensatory/adapative  strategies as needed to improve patient safety and self-efficacy.   OBJECTIVE IMPAIRMENTS Abnormal gait, decreased activity tolerance, decreased balance, decreased cognition, decreased coordination, decreased knowledge of use of DME, decreased mobility, difficulty walking, decreased strength, decreased safety awareness, and impaired tone.   ACTIVITY LIMITATIONS carrying, lifting, bending,  standing, squatting, stairs, transfers, reach over head, and locomotion level  PARTICIPATION LIMITATIONS: meal prep, cleaning, laundry, interpersonal relationship, driving, shopping, and yard work  PERSONAL FACTORS Age, Time since onset of injury/illness/exacerbation, and 1-2 comorbidities: CA and hx of CVA  are also affecting patient's functional outcome.   REHAB POTENTIAL: Good  CLINICAL DECISION MAKING: Evolving/moderate complexity  EVALUATION COMPLEXITY: Moderate  PLAN: PT FREQUENCY: 1-2x/week  PT DURATION: 8 weeks  PLANNED INTERVENTIONS: Therapeutic exercises, Therapeutic activity, Neuromuscular re-education, Balance training, Gait training, Patient/Family education, Joint mobilization, Vestibular training, Canalith repositioning, DME instructions, Wheelchair mobility training, Splintting, Taping, and Manual therapy  PLAN FOR NEXT SESSION: Continue with static standing balance activities, LE strength, and forced weight bearing LLE to improve stability/strengt   4:49 PM, 04/13/22 M. Sherlyn Lees, PT, DPT Physical Therapist- [place] Office Number: [phone]

## 2022-04-14 ENCOUNTER — Inpatient Hospital Stay: Payer: Medicare HMO | Attending: Oncology | Admitting: Oncology

## 2022-04-14 ENCOUNTER — Other Ambulatory Visit: Payer: Self-pay | Admitting: Oncology

## 2022-04-14 ENCOUNTER — Other Ambulatory Visit: Payer: Self-pay

## 2022-04-14 VITALS — BP 137/86 | HR 88 | Temp 98.1°F | Resp 18 | Ht 71.0 in | Wt 177.0 lb

## 2022-04-14 DIAGNOSIS — D6959 Other secondary thrombocytopenia: Secondary | ICD-10-CM | POA: Diagnosis not present

## 2022-04-14 DIAGNOSIS — I82552 Chronic embolism and thrombosis of left peroneal vein: Secondary | ICD-10-CM | POA: Diagnosis not present

## 2022-04-14 DIAGNOSIS — Z7901 Long term (current) use of anticoagulants: Secondary | ICD-10-CM | POA: Diagnosis not present

## 2022-04-14 DIAGNOSIS — N189 Chronic kidney disease, unspecified: Secondary | ICD-10-CM | POA: Insufficient documentation

## 2022-04-14 DIAGNOSIS — C787 Secondary malignant neoplasm of liver and intrahepatic bile duct: Secondary | ICD-10-CM | POA: Insufficient documentation

## 2022-04-14 DIAGNOSIS — C229 Malignant neoplasm of liver, not specified as primary or secondary: Secondary | ICD-10-CM

## 2022-04-14 DIAGNOSIS — C801 Malignant (primary) neoplasm, unspecified: Secondary | ICD-10-CM | POA: Insufficient documentation

## 2022-04-14 DIAGNOSIS — E1122 Type 2 diabetes mellitus with diabetic chronic kidney disease: Secondary | ICD-10-CM | POA: Insufficient documentation

## 2022-04-14 DIAGNOSIS — I129 Hypertensive chronic kidney disease with stage 1 through stage 4 chronic kidney disease, or unspecified chronic kidney disease: Secondary | ICD-10-CM | POA: Insufficient documentation

## 2022-04-14 MED ORDER — PANTOPRAZOLE SODIUM 20 MG PO TBEC: DELAYED_RELEASE_TABLET | ORAL | 3 refills | Status: AC

## 2022-04-14 MED ORDER — OXYCODONE-ACETAMINOPHEN 5-325 MG PO TABS: 1.0000 | ORAL_TABLET | ORAL | 0 refills | Status: DC | PRN

## 2022-04-14 NOTE — Progress Notes (Signed)
Robert Warner OFFICE PROGRESS NOTE   Diagnosis: Metastatic carcinoma  INTERVAL HISTORY:    Robert Warner returns as scheduled.  Robert Warner is here today with Robert Warner and Warner.  Robert Warner reports improved pain control with the increased dose of oxycodone.  Robert Warner is taking approximately 6-8 oxycodone tablets per day.  Objective:  Vital signs in last 24 hours:  Blood pressure 137/86, pulse 88, temperature 98.1 F (36.7 C), temperature source Oral, resp. rate 18, height 5' 11"  (1.803 m), weight 177 lb (80.3 kg), SpO2 100 %.    Resp: Lungs clear bilaterally Cardio: Regular rate and rhythm GI: No hepatosplenomegaly, nontender Vascular: No leg edema  Lab Results:  Lab Results  Component Value Date   WBC 4.4 04/07/2022   HGB 10.5 (L) 04/07/2022   HCT 32.5 (L) 04/07/2022   MCV 87.6 04/07/2022   PLT 174 04/07/2022   NEUTROABS 2.7 04/07/2022    CMP  Lab Results  Component Value Date   NA 135 04/07/2022   K 4.5 04/07/2022   CL 99 04/07/2022   CO2 27 04/07/2022   GLUCOSE 127 (H) 04/07/2022   BUN 22 04/07/2022   CREATININE 1.21 04/07/2022   CALCIUM 9.4 04/07/2022   PROT 7.1 04/07/2022   ALBUMIN 3.6 04/07/2022   AST 40 04/07/2022   ALT 28 04/07/2022   ALKPHOS 161 (H) 04/07/2022   BILITOT 0.6 04/07/2022   GFRNONAA 60 (L) 04/07/2022   GFRAA 95 06/15/2008    Lab Results  Component Value Date   CEA 233.44 (H) 04/07/2022   QZE092 288 (H) 04/07/2022   viewed the patient's current medications.   Assessment/Plan:  Poorly differentiated adenocarcinoma involving the liver Abdominal ultrasound 08/05/2021-indeterminate 90m solid mass in the right hepatic lobe.   MRI of the liver 09/06/2021-multifocal rim-enhancing lesions throughout both lobes of the liver.  Index lesion within the lateral dome of right hepatic lobe measures 1.1 cm, segment 4A lesion measures 1.6 x 1.4 cm, segment 2 lesion measures 0.8 x 0.7 cm, posteromedial margin of the right hepatic lobe subcapsular lesion  measures 2.5 x 2.0 cm 09/16/2021 CT angio neck-9 mm right upper lobe nodule Biopsy of a right liver mass on 09/23/2021.  Pathology shows poorly differentiated adenocarcinoma positive for cytokeratin 7, CDX2 and cytokeratin 20 and negative for TTF-1, PSA and prostein.  Differential diagnoses include pancreatobiliary and less likely upper GI. HER2 positive by FISH PD-L1 combined positive score 0% MSS equivocal, tumor mutation burden 0, ERBB2 amplification equivocal 09/29/2021 CA 19-9 81, CEA 33 PET scan 10/10/2021-solitary 10 mm right upper lobe pulmonary nodule with minimal FDG uptake.  7.5 mm left internal mammary lymph node hypermetabolic with SUV max 43.30  Numerous hepatic lesions.  Periportal lymphadenopathy SUV max 8.18.  2 adjacent lesions noted in the mesentery in the right mid abdomen with somewhat irregular margins, SUV 8.09.  No hypermetabolic colonic lesion identified to suggest a primary colon cancer.  No pancreatic lesion.  No gastric lesion.  No retroperitoneal lymphadenopathy. Upper endoscopy 10/13/2021-no mass, H. pylori gastritis on gastric biopsy Cycle 1 FOLFOX 10/19/2021 Cycle 2 FOLFOX 11/02/2021, Emend added, oxaliplatin dose reduced Cycle 3 FOLFOX 11/16/2021 Cycle 4 FOLFOX 11/30/2021 CT abdomen/pelvis with contrast on 12/05/2021-stable exam with bilobar hepatic masses, findings suggestive of metastatic gallbladder cancer. CT 01/25/2022-new and enlarging hepatic metastases, abnormal masslike appearance at the gallbladder and porta hepatis, stable left internal mammary node, increased left supraclavicular node, increased size of right upper lobe nodule Guardant360 02/17/2022-MSI high not detected,PTEN, RB1, GATA3 alterations Cycle 1 gemcitabine/cisplatin  and trastuzumab for 02/22/2022 Cycle 2 gemcitabine/cisplatin and trastuzumab 03/09/2022 Cycle 3 gemcitabine/cisplatin and trastuzumab 03/24/2022 03/26/2022 emergency room evaluation for increased abdominal pain-CT abdomen/pelvis 03/26/2022  with numerous hypodense liver lesions increased in size and number, unchanged soft tissue nodule within the mesentery of the right colon, contracted gallbladder Hospital admission 09/16/2021 - 09/19/2021 with gait disturbance-multifocal embolic stroke on MRI 83/12/9189, echo with interatrial shunt, transcranial Doppler with bubble study indicative of a medium size right to left shunt, lower extremity Doppler studies negative for DVT Hospital admission 09/20/2021 - 09/22/2021 with chest pain-CT coronary with no acute findings.   Diabetes Hypertension BPH Chronic kidney disease Brain CT 11/18/2021-new "lesion" in the left cerebellum when compared to brain MRI 09/16/2021.  Appearance of rapid development suspicious for a subacute infarct.  Metastatic focus not excluded.  Follow-up brain MRI with contrast recommended in the next 4 to 6 weeks. [facility] admission 6/60/6004-HTXHFSFS embolic CVAs, Heparin then apixaban anticoagulation 10.  Klebsiella bacteremia 12/05/2021-no apparent source for infection 11.  Thrombocytopenia secondary to chemotherapy and bacteremia 12.  Left peroneal DVT 12/05/2021 13.  Hospital admission 2/39/5320-EBXID embolic stroke MRI brain 01/23/2022-multiple, mostly punctate acute infarcts in the right greater than left cerebral hemisphere and left cerebellum with a larger area of infarction in the posterior right frontal lobe, subacute infarct in the left cerebellar hemisphere Lower extremity Dopplers 01/23/2022-chronic left peroneal DVT Anticoagulation changed to Lovenox    Disposition: [name] appears stable.  Robert Warner is here today with Robert family to discuss the prognosis and treatment options.  There is clinical and radiologic evidence of disease progression.  I recommend discontinuing the gemcitabine/cisplatin and trastuzumab.  We discussed treatment options.  I explained the small chance of clinical benefit with salvage systemic therapy.  I feel the potential toxicities associated  with additional systemic therapy outweigh the clinical benefit.  I recommend supportive care.  We discussed hospice.  Robert Warner does not wish to enroll in hospice at present.  Robert Warner will continue the current narcotic pain regimen.  Robert Warner will return for an office visit and further discussion in 2 weeks.  [name], MD  04/14/2022  4:26 PM

## 2022-04-15 ENCOUNTER — Other Ambulatory Visit: Payer: Self-pay | Admitting: Oncology

## 2022-04-17 ENCOUNTER — Ambulatory Visit: Payer: Medicare HMO | Admitting: Occupational Therapy

## 2022-04-17 ENCOUNTER — Ambulatory Visit: Payer: Medicare HMO

## 2022-04-17 DIAGNOSIS — R41841 Cognitive communication deficit: Secondary | ICD-10-CM | POA: Diagnosis not present

## 2022-04-17 DIAGNOSIS — M6281 Muscle weakness (generalized): Secondary | ICD-10-CM

## 2022-04-17 DIAGNOSIS — I63433 Cerebral infarction due to embolism of bilateral posterior cerebral arteries: Secondary | ICD-10-CM | POA: Diagnosis not present

## 2022-04-17 DIAGNOSIS — I69354 Hemiplegia and hemiparesis following cerebral infarction affecting left non-dominant side: Secondary | ICD-10-CM | POA: Diagnosis not present

## 2022-04-17 DIAGNOSIS — R2681 Unsteadiness on feet: Secondary | ICD-10-CM

## 2022-04-17 DIAGNOSIS — R2689 Other abnormalities of gait and mobility: Secondary | ICD-10-CM

## 2022-04-17 DIAGNOSIS — R293 Abnormal posture: Secondary | ICD-10-CM | POA: Diagnosis not present

## 2022-04-17 DIAGNOSIS — R41840 Attention and concentration deficit: Secondary | ICD-10-CM

## 2022-04-17 DIAGNOSIS — R278 Other lack of coordination: Secondary | ICD-10-CM | POA: Diagnosis not present

## 2022-04-17 NOTE — Therapy (Signed)
OUTPATIENT PHYSICAL THERAPY TREATMENT NOTE   Patient Name: Robert Warner MRN: [id number] DOB:September 13, 1941, 81 y.o., male Today's Date: 04/17/2022  PCP: none listed REFERRING PROVIDER: [name], MD  END OF SESSION:   PT End of Session - 04/17/22 1450     Visit Number 2    Number of Visits 16    Date for PT Re-Evaluation 06/08/22    Authorization Type [employer] 2023    Authorization Time Period auth required via Cohere    PT Start Time 1445    PT Stop Time 7741    PT Time Calculation (min) 45 min    Equipment Utilized During Treatment Gait belt    Activity Tolerance Patient tolerated treatment well    Behavior During Therapy WFL for tasks assessed/performed             Past Medical History:  Diagnosis Date   Arthritis    Cancer ([place])    Cataract    Diabetes mellitus    Hyperlipidemia    Stroke [facility])    Past Surgical History:  Procedure Laterality Date   APPENDECTOMY  1968   CATARACT EXTRACTION Bilateral 07/2000   states 2 weeks ago (first of sept) OD due in Dec    COLONOSCOPY  2004,2009   Negative, Dr. Sharlett Iles    IR IMAGING GUIDED PORT INSERTION  10/14/2021   KIDNEY STONE SURGERY  10/2018   PROSTATE BIOPSY  2006   Dr.Sigmund Tannebaum   Patient Active Problem List   Diagnosis Date Noted   Gait abnormality 03/28/2022   Left arm weakness 01/23/2022   UTI (urinary tract infection) 01/23/2022   Cerebrovascular accident (CVA) due to bilateral embolism of posterior cerebral arteries ([place]) 12/30/2021   Right foot drop 12/30/2021   Bacteremia due to Klebsiella pneumoniae 12/07/2021   DVT in Left peroneal vein 12/07/2021   CVA (cerebral vascular accident) ([place]) 12/06/2021   AKI (acute kidney injury) ([place]) 12/04/2021   Hyponatremia 12/04/2021   Cancer with unknown primary site ([place]) 10/11/2021   Goals of care, counseling/discussion 10/11/2021   Chest pain 09/21/2021   Recent cerebrovascular accident (CVA) 09/21/2021   Interatrial cardiac shunt  28/78/6767   Acute embolic stroke ([place]) 20/94/7096   Mixed diabetic hyperlipidemia associated with type 2 diabetes mellitus ([place]) 09/16/2021   Liver masses 09/16/2021   Essential hypertension 03/12/2012   Type 2 diabetes mellitus with hyperglycemia ([place]) 06/15/2008   BPH (benign prostatic hyperplasia) 06/15/2008    REFERRING DIAG: I63.9 (ICD-10-CM) - CVA (cerebral vascular accident) ([place])   THERAPY DIAG:  Muscle weakness (generalized)  Unsteadiness on feet  Other abnormalities of gait and mobility  Rationale for Evaluation and Treatment Rehabilitation  PERTINENT HISTORY: HTN HLD Metastatic adenocarcinoma to liver of unknown primary  Stroke, 1st Nov 4th 2022  PRECAUTIONS: fall risk  SUBJECTIVE: Feeling fine, no new issues over the weekend  PAIN:  Are you having pain? No   OBJECTIVE:   TODAY'S TREATMENT: 04/17/22 Activity Comments  NU-step level 5 x 5 min for dynamic warm-up   Standing march 3x10 At countertop  Tandem Stance 2x30 sec ea foot leading At countertop, heavy reliance on UE support  Sidestepping along countertop 2x2 min   Standing hip abduction 2x10 reps   Long arc quad 2x10       COGNITION: Overall cognitive status: Impaired: Attention: Impaired: Focused, Sustained, Divided, Memory: Impaired: Immediate Procedural, and Awareness: Impaired: Anticipatory             SENSATION: Light touch: Impaired  Proprioception: Impaired    COORDINATION: Difficulty with rapid alternating movements       MUSCLE TONE: no spasticity present, possibly some hypotonia LLE         POSTURE: No Significant postural limitations   LOWER EXTREMITY ROM:    No limitations noted   LOWER EXTREMITY MMT:     MMT Right Eval Left Eval  Hip flexion 5 3+  Hip extension      Hip abduction 5 3+  Hip adduction 5 4  Hip internal rotation      Hip external rotation      Knee flexion      Knee extension 5 3+  Ankle dorsiflexion 5 4  Ankle plantarflexion      Ankle  inversion      Ankle eversion      (Blank rows = not tested)   BED MOBILITY:  Independent   TRANSFERS: Assistive device utilized: Environmental consultant - 2 wheeled  Sit to stand: Modified independence Stand to sit: Modified independence Chair to chair: Modified independence Floor:  DNT     STAIRS:           Level of Assistance: SBA           Stair Negotiation Technique: Step to Pattern with Single Rail on Right           Number of Stairs: 4             Height of Stairs: 4-6  Comments: decreased speed   GAIT: Gait pattern: step through pattern and poor foot clearance- Left Distance walked: 150 Assistive device utilized: Walker - 2 wheeled Level of assistance: SBA Comments:    FUNCTIONAL TESTs:      [facility] PT Assessment - 04/13/22 0001                Balance    Balance Assessed Yes          Static Standing Balance    Static Standing - Comment/# of Minutes --          Standardized Balance Assessment    Standardized Balance Assessment Berg Balance Test;Timed Up and Go Test;Five Times Sit to Stand     Five times sit to stand comments  19.37          Berg Balance Test    Sit to Stand Able to stand  independently using hands     Standing Unsupported Able to stand safely 2 minutes     Sitting with Back Unsupported but Feet Supported on Floor or Stool Able to sit safely and securely 2 minutes     Stand to Sit Sits safely with minimal use of hands     Transfers Able to transfer safely, minor use of hands     Standing Unsupported with Eyes Closed Able to stand 10 seconds with supervision     Standing Unsupported with Feet Together Able to place feet together independently and stand for 1 minute with supervision     From Standing, Reach Forward with Outstretched Arm Can reach forward >12 cm safely (5")     From Standing Position, Pick up Object from Floor Able to pick up shoe, needs supervision     From Standing Position, Turn to Look Behind Over each Shoulder Turn sideways only but  maintains balance     Turn 360 Degrees Needs close supervision or verbal cueing     Standing Unsupported, Alternately Place Feet on Step/Stool Able to complete >2 steps/needs minimal assist  Standing Unsupported, One Foot in [employer] balance while stepping or standing     Standing on One Leg Tries to lift leg/unable to hold 3 seconds but remains standing independently     Total Score 36          Timed Up and Go Test    Normal TUG (seconds) 26.1                    M-CTSIB  Condition 1: Firm Surface, EO 30 Sec, Mild Sway  Condition 2: Firm Surface, EC 30 Sec, Moderate Sway  Condition 3: Foam Surface, EO 20 Sec, Severe Sway  Condition 4: Foam Surface, EC 5 Sec,  LOB  Sway    PATIENT SURVEYS:      TODAY'S TREATMENT:  PT evaluation/assessment Tandem stance 3x30 sec at counter Marching 3x10 at counter     PATIENT EDUCATION: Education details: fall risk assessments and HEP initiation Person educated: Patient and Spouse Education method: Explanation and Demonstration Education comprehension: verbalized understanding     HOME EXERCISE PROGRAM: Tandem stance 3x30 sec at counter March at counter 3x10  Access Code: [email or web address] URL: [email or web address] Date: 04/17/2022 Prepared by: Sherlyn Lees  Exercises - Side Stepping with Counter Support  - 1 x daily - 7 x weekly - 2 sets - 2 min hold - Standing Hip Abduction with Counter Support  - 1 x daily - 7 x weekly - 3 sets - 10 reps - Seated Long Arc Quad  - 1 x daily - 7 x weekly - 3 sets - 10 reps - 2 sec hold     GOALS: Goals reviewed with patient? Yes   SHORT TERM GOALS: Target date: 05/11/2022   Patient will be independent in HEP to improve functional outcomes Baseline: Goal status: INITIAL   2.  Patient will demonstrate score 45/56 Berg Balance Test to manifest low risk for falls Baseline: 36/56 Goal status: INITIAL       LONG TERM GOALS: Target date: 06/08/2022   Demonstrate improved BLE  strength and balance as evidenced by time of 15 sec for 5xSTS test Baseline: 19+ sec Goal status: INITIAL   2.  Patient will achieve 12 seconds for TUG test to manifest reduced risk for falls Baseline: 23 w/ RW Goal status: INITIAL   3.  Demonstrate improved proprioception and static balance as evidenced by ability to maintain minimal sway condition 4 M-CTSIB Baseline: complete LOB Goal status: INITIAL       ASSESSMENT:   CLINICAL IMPRESSION: Good return demonstration for initial HEP and able to progress to additional activities to improve LE strength and balance in order to reduce risk for falls. Demonstrates some safety/awareness deficits requiring verbal cues for reorientation e.g. proper use of RW, direction and cues in sequencing. Demonstrates quick onset of fatigue requiring therapeutic rest periods between sets, no adverse response noted other than feeling of fatigue, vital signs WNL. Continued sessions indicated to progress strength and dynamic balance activities to reduce risk for falls.      OBJECTIVE IMPAIRMENTS Abnormal gait, decreased activity tolerance, decreased balance, decreased cognition, decreased coordination, decreased knowledge of use of DME, decreased mobility, difficulty walking, decreased strength, decreased safety awareness, and impaired tone.    ACTIVITY LIMITATIONS carrying, lifting, bending, standing, squatting, stairs, transfers, reach over head, and locomotion level   PARTICIPATION LIMITATIONS: meal prep, cleaning, laundry, interpersonal relationship, driving, shopping, and yard work   PERSONAL FACTORS Age, Time since onset of injury/illness/exacerbation, and 1-2 comorbidities: CA and hx of  CVA  are also affecting patient's functional outcome.    REHAB POTENTIAL: Good   CLINICAL DECISION MAKING: Evolving/moderate complexity   EVALUATION COMPLEXITY: Moderate   PLAN: PT FREQUENCY: 1-2x/week   PT DURATION: 8 weeks   PLANNED INTERVENTIONS: Therapeutic  exercises, Therapeutic activity, Neuromuscular re-education, Balance training, Gait training, Patient/Family education, Joint mobilization, Vestibular training, Canalith repositioning, DME instructions, Wheelchair mobility training, Splintting, Taping, and Manual therapy   PLAN FOR NEXT SESSION:  HEP review, stride stance sit-stand (alternating foot placement ea set)  2:51 PM, 04/17/22 M. Sherlyn Lees, PT, DPT Physical Therapist- [place] Office Number: [phone]

## 2022-04-17 NOTE — Therapy (Unsigned)
OUTPATIENT OCCUPATIONAL THERAPY NEURO EVALUATION  Patient Name: Robert Warner MRN: [id number] DOB:30-Oct-1941, 81 y.o., male Today's Date: 04/18/2022  PCP: [name], MD REFERRING PROVIDER: [name], MD   OT End of Session - 04/18/22 0806     Visit Number 1    Number of Visits 17    Date for OT Re-Evaluation 06/16/22    Authorization Type [employer]    OT Start Time 3532    OT Stop Time 1625    OT Time Calculation (min) 50 min    Activity Tolerance Patient tolerated treatment well    Behavior During Therapy WFL for tasks assessed/performed             Past Medical History:  Diagnosis Date   Arthritis    Cancer ([place])    Cataract    Diabetes mellitus    Hyperlipidemia    Stroke [facility])    Past Surgical History:  Procedure Laterality Date   APPENDECTOMY  1968   CATARACT EXTRACTION Bilateral 07/2000   states 2 weeks ago (first of sept) OD due in Dec    COLONOSCOPY  2004,2009   Negative, Dr. Sharlett Iles    IR IMAGING GUIDED PORT INSERTION  10/14/2021   KIDNEY STONE SURGERY  10/2018   PROSTATE BIOPSY  2006   Dr.Sigmund Tannebaum   Patient Active Problem List   Diagnosis Date Noted   Gait abnormality 03/28/2022   Left arm weakness 01/23/2022   UTI (urinary tract infection) 01/23/2022   Cerebrovascular accident (CVA) due to bilateral embolism of posterior cerebral arteries ([place]) 12/30/2021   Right foot drop 12/30/2021   Bacteremia due to Klebsiella pneumoniae 12/07/2021   DVT in Left peroneal vein 12/07/2021   CVA (cerebral vascular accident) ([address]) 12/06/2021   AKI (acute kidney injury) ([place]) 12/04/2021   Hyponatremia 12/04/2021   Cancer with unknown primary site [facility]) 10/11/2021   Goals of care, counseling/discussion 10/11/2021   Chest pain 09/21/2021   Recent cerebrovascular accident (CVA) 09/21/2021   Interatrial cardiac shunt 99/24/2683   Acute embolic stroke ([place]) 41/96/2229   Mixed diabetic hyperlipidemia associated with type 2  diabetes mellitus ([place]) 09/16/2021   Liver masses 09/16/2021   Essential hypertension 03/12/2012   Type 2 diabetes mellitus with hyperglycemia ([place]) 06/15/2008   BPH (benign prostatic hyperplasia) 06/15/2008    ONSET DATE: 01/23/2022  REFERRING DIAG: I63.9 (ICD-10-CM) - CVA (cerebral vascular accident)   THERAPY DIAG:  Hemiplegia and hemiparesis following cerebral infarction affecting left non-dominant side (HCC)  Muscle weakness (generalized)  Unsteadiness on feet  Attention and concentration deficit  Other lack of coordination  Rationale for Evaluation and Treatment Rehabilitation  SUBJECTIVE:   SUBJECTIVE STATEMENT: Pt reports initial difficulty with decreased memory and recall.  Pt's wife reports that pt was falling and couldn't get up.  Pt  Pt accompanied by: significant other  PERTINENT HISTORY: multiple CVA,intra-atrial shunt, metastatic adenocarcinoma of liver of unknown primary on chemotherapy, left peroneal vein DVT 11/2021.  PRECAUTIONS: Fall  WEIGHT BEARING RESTRICTIONS No  PAIN:  Are you having pain? No  FALLS: Has patient fallen in last 6 months? Yes. Number of falls 2-3  LIVING ENVIRONMENT: Lives with: lives with their spouse Lives in: House/apartment Stairs: Yes: External: 4-6 steps; on right going up Has following equipment at home: Walker - 2 wheeled, Shower bench, bed side commode, and Grab bars  PLOF: Independent, Independent with basic ADLs, and Requires assistive device for independence  PATIENT GOALS to be more independent  OBJECTIVE:  HAND DOMINANCE: Right  ADLs: Transfers/ambulation related to ADLs: Eating: still needs assistance with cutting tougher meat, occasionally drops food items Grooming: Supervision/setup UB Dressing: Min-mod assist LB Dressing: Min-mod assist Toileting: Mod I Bathing: seated for majority of shower, wife provides supervision and holds shower head Tub Shower transfers: supervision, uses grab  bars Equipment: Transfer tub bench, Grab bars, and Walk in shower   IADLs: Shopping: no Light housekeeping: was doing the majority of housekeeping prior to stroke, but not now Meal Prep: Would cook breakfast on the weekends, but no cooking since stroke Community mobility: wife is driving now, pt did drive before stroke Medication management: wife dispenses Financial management: wife is doing that  MOBILITY STATUS: Needs Assist: Supervision with RW  POSTURE COMMENTS:  rounded shoulders and forward head  ACTIVITY TOLERANCE: Activity tolerance: WFL for tasks completed during evaluation  FUNCTIONAL OUTCOME MEASURES: FOTO: 55  UPPER EXTREMITY ROM     Active ROM Right eval Left eval  Shoulder flexion 160 150  Shoulder abduction    Shoulder adduction    Shoulder extension    Shoulder internal rotation WNL WNL  Shoulder external rotation WNL WNL  Elbow flexion WNL WNL  Elbow extension WNL WNL  Wrist flexion    Wrist extension    Wrist ulnar deviation    Wrist radial deviation    Wrist pronation    Wrist supination    (Blank rows = not tested)   UPPER EXTREMITY MMT:     MMT Right eval Left eval  Shoulder flexion 4+/5 3+/5  Shoulder abduction    Shoulder adduction    Shoulder extension    Shoulder internal rotation    Shoulder external rotation    Middle trapezius    Lower trapezius    Elbow flexion 4+/5 4/5  Elbow extension 4+/5 4/5  Wrist flexion    Wrist extension    Wrist ulnar deviation    Wrist radial deviation    Wrist pronation    Wrist supination    (Blank rows = not tested)  HAND FUNCTION: Grip strength: Right: 50 lbs; Left: 44 lbs  COORDINATION: Finger Nose Finger test: mild dysmetria on L, required increased cues and demonstration for technique 9 Hole Peg test: Right: 39.94 sec; Left: 1:02.85 sec Box and Blocks:  Right 25 blocks, Left 19 blocks  Difficulty with sequencing and therapist had to provide cues/instructions x3 to ensure proper  technique.  SENSATION: Light touch: Impaired  Proprioception: Impaired    COGNITION: Overall cognitive status: Impaired: Attention: Impaired, Memory: Impaired: Short term Procedural, and Executive function: Impaired: Initiation, Organization, Planning, and Error awareness  VISION: Baseline vision: Bifocals Visual history: cataracts and cataract removal ~3 years ago  VISION ASSESSMENT: To be further assessed in functional context   PRAXIS: Impaired: Motor planning    PATIENT EDUCATION: Education details: Educated on role and purpose of OT as well as potential interventions and goals for therapy based on initial evaluation findings. Person educated: Patient and Spouse Education method: Explanation Education comprehension: verbalized understanding   HOME EXERCISE PROGRAM: TBD    GOALS: Goals reviewed with patient? No  SHORT TERM GOALS: Target date: 05/19/2022  STG   Status:  1 Pt will be independent with coordination HEP to increase independence with ADLs and IADLs. Baseline:  INITIAL  2 Pt will complete table top scanning activity with min cues for technique and recognition of errors. Baseline:  INITIAL  3 Pt will demonstrate ability to sequence simple functional task (simple snack prep, laundry task,  etc) with supervision Baseline:  INITIAL  4 Pt will demonstrate improved fine motor coordination for ADLs as evidenced by decreasing 9 hole peg test score for LUE by 10 secs Baseline: Right: 39.94 sec; Left: 1:02.85 sec INITIAL   LONG TERM GOALS: Target date: 06/16/2022  LTG   Status:  1 Pt will navigate a moderately busy environment, following multi-step commands with 90% accuracy Baseline:  INITIAL  2 Pt will be able to complete UB dressing with supervision and <2 cues demonstrating improved awareness of errors and sequencing of tasks. Baseline:  INITIAL  3 Pt will demonstrate improved engagement and motivation to engage in leisure tasks by report of completing 2  familiar tasks with < min encouragement. Baseline:  INITIAL  4 Pt will demonstrate improved fine motor coordination for ADLs as evidenced by decreasing 9 hole peg test score for LUE by 15 secs Baseline: Right: 39.94 sec; Left: 1:02.85 sec INITIAL  5 Pt will demonstrate improved UE functional use for ADLs as evidenced by increasing box/ blocks score by 5 blocks with BUE Baseline: Right 25 blocks, Left 19 blocks  INITIAL    ASSESSMENT:  CLINICAL IMPRESSION: Patient is a 81 y.o. male who was seen today for occupational therapy evaluation due to impaired coordination, strength, attention, sequencing, memory, and decreased motivation s/p most recent CVA. Pt currently lives with spouse in a single level home with 4-6 steps to enter.  Wife is currently assisting pt at min-mod assist level with self-care tasks especially dressing due to decreased initiation and orientation of clothing as well as decreased problem solving and awareness of errors. Pt will benefit from skilled occupational therapy services to address strength and coordination, ROM, pain management, altered sensation, balance, GM/FM control, cognition, safety awareness, introduction of compensatory strategies/AE prn, visual-perception, and implementation of an HEP to improve participation and safety during ADLs and IADLs.   PERFORMANCE DEFICITS in functional skills including ADLs, IADLs, coordination, proprioception, sensation, ROM, strength, FMC, GMC, balance, body mechanics, endurance, decreased knowledge of precautions, and UE functional use, cognitive skills including attention, memory, problem solving, and sequencing.  IMPAIRMENTS are limiting patient from ADLs and IADLs.   COMORBIDITIES may have co-morbidities  that affects occupational performance. Patient will benefit from skilled OT to address above impairments and improve overall function.  MODIFICATION OR ASSISTANCE TO COMPLETE EVALUATION: Min-Moderate modification of tasks or  assist with assess necessary to complete an evaluation.  OT OCCUPATIONAL PROFILE AND HISTORY: Detailed assessment: Review of records and additional review of physical, cognitive, psychosocial history related to current functional performance.  CLINICAL DECISION MAKING: Moderate - several treatment options, min-mod task modification necessary  REHAB POTENTIAL: Good  EVALUATION COMPLEXITY: Moderate    PLAN: OT FREQUENCY: 2x/week  OT DURATION: 8 weeks  PLANNED INTERVENTIONS: self care/ADL training, therapeutic exercise, therapeutic activity, neuromuscular re-education, manual therapy, balance training, functional mobility training, electrical stimulation, ultrasound, moist heat, cryotherapy, patient/family education, cognitive remediation/compensation, visual/perceptual remediation/compensation, psychosocial skills training, energy conservation, and DME and/or AE instructions  RECOMMENDED OTHER SERVICES: N/A  CONSULTED AND AGREED WITH PLAN OF CARE: Patient and family member/caregiver  PLAN FOR NEXT SESSION: further assess sensation (stereognosis), engage in table top tasks with focus on attention and increasing to dual task as able   Zymeir Salminen, [place], OTR/L 04/18/2022, 8:07 AM

## 2022-04-18 ENCOUNTER — Encounter: Payer: Self-pay | Admitting: Physical Medicine & Rehabilitation

## 2022-04-18 ENCOUNTER — Encounter: Payer: Medicare HMO | Attending: Registered Nurse | Admitting: Physical Medicine & Rehabilitation

## 2022-04-18 VITALS — BP 115/82 | HR 80 | Temp 97.9°F | Wt 176.0 lb

## 2022-04-18 DIAGNOSIS — I69359 Hemiplegia and hemiparesis following cerebral infarction affecting unspecified side: Secondary | ICD-10-CM | POA: Insufficient documentation

## 2022-04-18 NOTE — Progress Notes (Signed)
Subjective:    Patient ID: Robert Warner, male    DOB: 05-01-1941, 81 y.o.   MRN: [id number] 81 y.o. male who presented with left sided weakness on 01/23/2022. Imaging revealed embolic shower the largest of the right MCA territory. On Eliquis at home and deemed Eliquis failure therefore Lovenox 120 mg daily started. He has history of intra-atrial shunt, metastatic adenocarcinoma of liver with hypercoagulable state.  Admit date: 01/27/2022 Discharge date: 02/09/2022 HPI Per OP OT eval 04/17/22 Eating: still needs assistance with cutting tougher meat, occasionally drops food items Grooming: Supervision/setup UB Dressing: Min-mod assist LB Dressing: Min-mod assist Toileting: Mod I Bathing: seated for majority of shower, wife provides supervision and holds shower head Tub Shower transfers: supervision, uses grab bars  Also receiving OP PT   Off MABs, no salvage chemo as per Onc note from 6/2 Pain Inventory Average Pain 7 Pain Right Now 0 My pain is  not described  LOCATION OF PAIN  stomach  BOWEL Number of stools per week: 7 Oral laxative use Yes  Type of laxative senekot and miralax   BLADDER Frequent urination Yes   Mobility use a walker how many minutes can you walk? 10 ability to climb steps?  yes do you drive?  no Do you have any goals in this area?  yes  Function retired  Neuro/Psych weakness trouble walking confusion  Prior Studies Any changes since last visit?  no  Physicians involved in your care Any changes since last visit?  no   Family History  Problem Relation Age of Onset   Hypertension Mother    Coronary artery disease Mother    Stroke Father 58   Stroke Sister    Pancreatic cancer Brother    Diabetes Maternal Aunt    Coronary artery disease Maternal Uncle        2 Maternal Uncles    Diabetes Maternal Uncle    Breast cancer Paternal Aunt    Coronary artery disease Paternal Aunt    Coronary artery disease Maternal Grandmother    Stroke  Paternal Grandmother    Heart disease Neg Hx    Colon cancer Neg Hx    Esophageal cancer Neg Hx    Rectal cancer Neg Hx    Stomach cancer Neg Hx    Social History   Socioeconomic History   Marital status: Married    Spouse name: Not on file   Number of children: 2   Years of education: Not on file   Highest education level: Not on file  Occupational History   Occupation: retired Airline pilot  Tobacco Use   Smoking status: Former    Packs/day: 0.50    Years: 10.00    Pack years: 5.00    Types: Cigarettes    Quit date: 11/13/1966    Years since quitting: 55.4   Smokeless tobacco: Never  Vaping Use   Vaping Use: Never used  Substance and Sexual Activity   Alcohol use: Not Currently    Alcohol/week: 2.0 standard drinks    Types: 2 Glasses of wine per week    Comment: Occasional wine or beer   Drug use: No   Sexual activity: Yes  Other Topics Concern   Not on file  Social History Narrative   Lives at home with wife   Right handed   Caffeine: 1 cup occasionally    Social Determinants of Health   Financial Resource Strain: Low Risk    Difficulty of Paying Living Expenses: Not hard at  all  Food Insecurity: No Food Insecurity   Worried About Charity fundraiser in the Last Year: Never true   Ran Out of Food in the Last Year: Never true  Transportation Needs: No Transportation Needs   Lack of Transportation (Medical): No   Lack of Transportation (Non-Medical): No  Physical Activity: Sufficiently Active   Days of Exercise per Week: 3 days   Minutes of Exercise per Session: 60 min  Stress: No Stress Concern Present   Feeling of Stress : Not at all  Social Connections: Socially Integrated   Frequency of Communication with Friends and Family: Three times a week   Frequency of Social Gatherings with Friends and Family: Three times a week   Attends Religious Services: More than 4 times per year   Active Member of Clubs or Organizations: Yes   Attends Archivist  Meetings: More than 4 times per year   Marital Status: Married   Past Surgical History:  Procedure Laterality Date   APPENDECTOMY  1968   CATARACT EXTRACTION Bilateral 07/2000   states 2 weeks ago (first of sept) OD due in Dec    COLONOSCOPY  2004,2009   Negative, Dr. Sharlett Iles    IR IMAGING GUIDED PORT INSERTION  10/14/2021   KIDNEY STONE SURGERY  10/2018   PROSTATE BIOPSY  2006   Dr.Sigmund Tannebaum   Past Medical History:  Diagnosis Date   Arthritis    Cancer ([place])    Cataract    Diabetes mellitus    Hyperlipidemia    Stroke ([place])    BP 115/82   Pulse 80   Temp 97.9 F (36.6 C)   Wt 176 lb (79.8 kg)   SpO2 98%   BMI 24.55 kg/m   Opioid Risk Score:   Fall Risk Score:  `1  Depression screen [facility] 2/9     04/18/2022   10:52 AM 02/28/2022    3:04 PM 01/16/2022    4:05 PM 01/03/2022    8:46 AM 12/02/2021    3:09 PM 08/29/2021    8:31 AM 08/10/2021    1:22 PM  Depression screen PHQ 2/9  Decreased Interest 0 2  0 0 0 0  Down, Depressed, Hopeless 0 1 0 0 0 0 0  PHQ - 2 Score 0 3 0 0 0 0 0  Altered sleeping  2 0      Tired, decreased energy  3 0      Change in appetite  1 0      Feeling bad or failure about yourself   0 0      Trouble concentrating  2 0      Moving slowly or fidgety/restless  1 0      Suicidal thoughts  0 0      PHQ-9 Score  12 0      Difficult doing work/chores  Somewhat difficult          Review of Systems  Constitutional:  Positive for unexpected weight change.       Loss  HENT: Negative.    Eyes: Negative.   Respiratory: Negative.    Cardiovascular: Negative.   Gastrointestinal:  Positive for abdominal pain.  Endocrine:       Blood sugar fluctuations  Genitourinary:  Positive for frequency.  Musculoskeletal:  Positive for gait problem.  Skin: Negative.   Allergic/Immunologic: Negative.   Hematological: Negative.   Psychiatric/Behavioral:  Positive for confusion.   All other systems reviewed and are negative.  Objective:    Physical Exam Vitals and nursing note reviewed.  Constitutional:      Appearance: He is normal weight.  HENT:     Head: Normocephalic and atraumatic.  Eyes:     Extraocular Movements: Extraocular movements intact.     Conjunctiva/sclera: Conjunctivae normal.     Pupils: Pupils are equal, round, and reactive to light.  Musculoskeletal:        General: No swelling or tenderness.     Comments: No pain with UE or LE ROM, no joint swelling  Skin:    General: Skin is warm and dry.  Neurological:     Mental Status: He is alert and oriented to person, place, and time.     Gait: Gait normal.     Comments: Motor strength 4/5 in Left Delt, Bi , tri, grip , HF, KE, ADF 5/5 on right side Tone is normal BUE and BLE  Amb with walker no toe drag or knee instability   Psychiatric:        Mood and Affect: Mood normal.        Behavior: Behavior normal.          Assessment & Plan:    Right MCA infarct , ~68mopost due to hypercoagulable state induced by adenoCa of liver , cont OP therapy and emphasized need to cont HEP once complete.  Also discussed that at some point functional status will worsen due to metastatic adenoCa of liver.  At that point would rec palliative care/hospice F/u PM&R prn Fu with Onc, FP and Neuro

## 2022-04-19 ENCOUNTER — Ambulatory Visit: Payer: Medicare HMO

## 2022-04-19 ENCOUNTER — Ambulatory Visit: Payer: Medicare HMO | Admitting: Occupational Therapy

## 2022-04-19 ENCOUNTER — Encounter: Payer: Self-pay | Admitting: Speech Pathology

## 2022-04-19 ENCOUNTER — Ambulatory Visit: Payer: Medicare HMO | Admitting: Speech Pathology

## 2022-04-19 DIAGNOSIS — R278 Other lack of coordination: Secondary | ICD-10-CM

## 2022-04-19 DIAGNOSIS — M6281 Muscle weakness (generalized): Secondary | ICD-10-CM

## 2022-04-19 DIAGNOSIS — R41840 Attention and concentration deficit: Secondary | ICD-10-CM | POA: Diagnosis not present

## 2022-04-19 DIAGNOSIS — I63433 Cerebral infarction due to embolism of bilateral posterior cerebral arteries: Secondary | ICD-10-CM | POA: Diagnosis not present

## 2022-04-19 DIAGNOSIS — R2681 Unsteadiness on feet: Secondary | ICD-10-CM

## 2022-04-19 DIAGNOSIS — R41841 Cognitive communication deficit: Secondary | ICD-10-CM | POA: Diagnosis not present

## 2022-04-19 DIAGNOSIS — R293 Abnormal posture: Secondary | ICD-10-CM | POA: Diagnosis not present

## 2022-04-19 DIAGNOSIS — I69354 Hemiplegia and hemiparesis following cerebral infarction affecting left non-dominant side: Secondary | ICD-10-CM | POA: Diagnosis not present

## 2022-04-19 DIAGNOSIS — R2689 Other abnormalities of gait and mobility: Secondary | ICD-10-CM

## 2022-04-19 NOTE — Therapy (Signed)
OUTPATIENT OCCUPATIONAL THERAPY NEURO EVALUATION  Patient Name: Robert Warner MRN: [id number] DOB:02-22-1941, 81 y.o., male Today's Date: 04/19/2022  PCP: [name], MD REFERRING PROVIDER: [name], MD   OT End of Session - 04/19/22 1007     Visit Number 2    Number of Visits 17    Date for OT Re-Evaluation 06/16/22    Authorization Type [employer]    OT Start Time [phone]   pt arrival   OT Stop Time 1016    OT Time Calculation (min) 38 min    Activity Tolerance Patient tolerated treatment well    Behavior During Therapy WFL for tasks assessed/performed              Past Medical History:  Diagnosis Date   Arthritis    Cancer ([place])    Cataract    Diabetes mellitus    Hyperlipidemia    Stroke [facility])    Past Surgical History:  Procedure Laterality Date   APPENDECTOMY  1968   CATARACT EXTRACTION Bilateral 07/2000   states 2 weeks ago (first of sept) OD due in Dec    COLONOSCOPY  2004,2009   Negative, Dr. Sharlett Iles    IR IMAGING GUIDED PORT INSERTION  10/14/2021   KIDNEY STONE SURGERY  10/2018   PROSTATE BIOPSY  2006   Dr.Sigmund Tannebaum   Patient Active Problem List   Diagnosis Date Noted   Hemiparesis affecting nondominant side as late effect of stroke ([place]) 04/18/2022   Gait abnormality 03/28/2022   Left arm weakness 01/23/2022   UTI (urinary tract infection) 01/23/2022   Cerebrovascular accident (CVA) due to bilateral embolism of posterior cerebral arteries ([place]) 12/30/2021   Right foot drop 12/30/2021   Bacteremia due to Klebsiella pneumoniae 12/07/2021   DVT in Left peroneal vein 12/07/2021   CVA (cerebral vascular accident) ([place]) 12/06/2021   AKI (acute kidney injury) ([place]) 12/04/2021   Hyponatremia 12/04/2021   Cancer with unknown primary site [facility]) 10/11/2021   Goals of care, counseling/discussion 10/11/2021   Chest pain 09/21/2021   Recent cerebrovascular accident (CVA) 09/21/2021   Interatrial cardiac shunt 42/59/5638    Acute embolic stroke ([place]) 75/64/3329   Mixed diabetic hyperlipidemia associated with type 2 diabetes mellitus ([place]) 09/16/2021   Liver masses 09/16/2021   Essential hypertension 03/12/2012   Type 2 diabetes mellitus with hyperglycemia ([place]) 06/15/2008   BPH (benign prostatic hyperplasia) 06/15/2008    ONSET DATE: 01/23/2022  REFERRING DIAG: I63.9 (ICD-10-CM) - CVA (cerebral vascular accident)   THERAPY DIAG:  Other lack of coordination  Attention and concentration deficit  Hemiplegia and hemiparesis following cerebral infarction affecting left non-dominant side (HCC)  Muscle weakness (generalized)  Rationale for Evaluation and Treatment Rehabilitation  SUBJECTIVE:   SUBJECTIVE STATEMENT: Pt reports his stomach has been bothering him the last few days, but feels better this morning. Pt accompanied by: significant other   PAIN:  Are you having pain? No  PATIENT GOALS to be more independent  OBJECTIVE:   Today's Treatment: Engaged in jigsaw puzzle with focus on initiation, sequencing, attention to task in moderately distracting environment, and use of LUE for coordination.  Pt initially flipping all pieces over upside down, requiring cues to turn picture side up.  Pt then moving pieces around but not placing them together without cues for sequencing and technique.  Pt required max cues for technique, sequencing, and organization of task.  Pt utilizing RUE primarily, requiring cues to utilize LUE to assist with turning over pieces and  when placing pieces together - otherwise pt maintaining LUE in lap without attempts to incorporate even when demonstrating difficulty with rotating and placing pieces together.  Engaged in large peg board activity with focus on attention to task, initiation, and functional use of LUE.  Pt demonstrating improved use of LUE with massed practice with pegs on L side of body and task directed to utilize LUE.  Pt completing simple pattern replication  without any cues. Stereognosis: Pt correctly identified 3 of 5 items.  Pt able to identify pen, paper clip, and rubber band.  Pt stating paper clip when feeling both key and coin.   PATIENT EDUCATION: Education details: Educated on attention and initiation during structured tasks and functional use of LUE at diminished level. Person educated: Patient Education method: Explanation Education comprehension: verbalized understanding   HOME EXERCISE PROGRAM: TBD    GOALS: Goals reviewed with patient? Yes  SHORT TERM GOALS: Target date: 05/19/2022  STG   Status:  1 Pt will be independent with coordination HEP to increase independence with ADLs and IADLs. Baseline:  IN PROGRESS  2 Pt will complete table top scanning activity with min cues for technique and recognition of errors. Baseline:  IN PROGRESS  3 Pt will demonstrate ability to sequence simple functional task (simple snack prep, laundry task, etc) with supervision Baseline:  IN PROGRESS  4 Pt will demonstrate improved fine motor coordination for ADLs as evidenced by decreasing 9 hole peg test score for LUE by 10 secs Baseline: Right: 39.94 sec; Left: 1:02.85 sec IN PROGRESS   LONG TERM GOALS: Target date: 06/16/2022  LTG   Status:  1 Pt will navigate a moderately busy environment, following multi-step commands with 90% accuracy Baseline:  IN PROGRESS  2 Pt will be able to complete UB dressing with supervision and <2 cues demonstrating improved awareness of errors and sequencing of tasks. Baseline:  IN PROGRESS  3 Pt will demonstrate improved engagement and motivation to engage in leisure tasks by report of completing 2 familiar tasks with < min encouragement. Baseline:  IN PROGRESS  4 Pt will demonstrate improved fine motor coordination for ADLs as evidenced by decreasing 9 hole peg test score for LUE by 15 secs Baseline: Right: 39.94 sec; Left: 1:02.85 sec IN PROGRESS  5 Pt will demonstrate improved UE functional use for ADLs  as evidenced by increasing box/ blocks score by 5 blocks with BUE Baseline: Right 25 blocks, Left 19 blocks  IN PROGRESS    ASSESSMENT:  CLINICAL IMPRESSION: Pt required more than reasonable amount of time and cues for initiation, sequencing, awareness, and problem solving during 12 piece jigsaw puzzle completion.  Pt demonstrating minimal awareness of errors, requiring cues and encouragement for completion of task.  Pt requiring cues to incorporate LUE into table top tasks.  Intermittent tactile cues to terminate task as pt demonstrating some perseveration.  PERFORMANCE DEFICITS in functional skills including ADLs, IADLs, coordination, proprioception, sensation, ROM, strength, FMC, GMC, balance, body mechanics, endurance, decreased knowledge of precautions, and UE functional use, cognitive skills including attention, memory, problem solving, and sequencing.  IMPAIRMENTS are limiting patient from ADLs and IADLs.   COMORBIDITIES may have co-morbidities  that affects occupational performance. Patient will benefit from skilled OT to address above impairments and improve overall function.  MODIFICATION OR ASSISTANCE TO COMPLETE EVALUATION: Min-Moderate modification of tasks or assist with assess necessary to complete an evaluation.  OT OCCUPATIONAL PROFILE AND HISTORY: Detailed assessment: Review of records and additional review of physical, cognitive, psychosocial history  related to current functional performance.  CLINICAL DECISION MAKING: Moderate - several treatment options, min-mod task modification necessary  REHAB POTENTIAL: Good  EVALUATION COMPLEXITY: Moderate    PLAN: OT FREQUENCY: 2x/week  OT DURATION: 8 weeks  PLANNED INTERVENTIONS: self care/ADL training, therapeutic exercise, therapeutic activity, neuromuscular re-education, manual therapy, balance training, functional mobility training, electrical stimulation, ultrasound, moist heat, cryotherapy, patient/family education,  cognitive remediation/compensation, visual/perceptual remediation/compensation, psychosocial skills training, energy conservation, and DME and/or AE instructions  RECOMMENDED OTHER SERVICES: N/A  CONSULTED AND AGREED WITH PLAN OF CARE: Patient and family member/caregiver  PLAN FOR NEXT SESSION: engage in table top tasks with focus on attention and increasing to dual task as able, LUE shoulder strengthening with functional reach   [place], [place], OTR/L 04/19/2022, 10:08 AM

## 2022-04-19 NOTE — Therapy (Signed)
OUTPATIENT PHYSICAL THERAPY TREATMENT NOTE   Patient Name: Robert Warner MRN: [id number] DOB:1941-03-07, 81 y.o., male Today's Date: 04/19/2022  PCP: none listed REFERRING PROVIDER: [name], MD  END OF SESSION:   PT End of Session - 04/19/22 1102     Visit Number 3    Number of Visits 16    Date for PT Re-Evaluation 06/08/22    Authorization Type [employer] 2023    Authorization Time Period auth required via Cohere    PT Start Time 1100    PT Stop Time 3299    PT Time Calculation (min) 45 min    Equipment Utilized During Treatment Gait belt    Activity Tolerance Patient tolerated treatment well    Behavior During Therapy WFL for tasks assessed/performed             Past Medical History:  Diagnosis Date   Arthritis    Cancer ([place])    Cataract    Diabetes mellitus    Hyperlipidemia    Stroke [facility])    Past Surgical History:  Procedure Laterality Date   APPENDECTOMY  1968   CATARACT EXTRACTION Bilateral 07/2000   states 2 weeks ago (first of sept) OD due in Dec    COLONOSCOPY  2004,2009   Negative, Dr. Sharlett Iles    IR IMAGING GUIDED PORT INSERTION  10/14/2021   KIDNEY STONE SURGERY  10/2018   PROSTATE BIOPSY  2006   Dr.Sigmund Tannebaum   Patient Active Problem List   Diagnosis Date Noted   Hemiparesis affecting nondominant side as late effect of stroke ([place]) 04/18/2022   Gait abnormality 03/28/2022   Left arm weakness 01/23/2022   UTI (urinary tract infection) 01/23/2022   Cerebrovascular accident (CVA) due to bilateral embolism of posterior cerebral arteries ([place]) 12/30/2021   Right foot drop 12/30/2021   Bacteremia due to Klebsiella pneumoniae 12/07/2021   DVT in Left peroneal vein 12/07/2021   CVA (cerebral vascular accident) ([place]) 12/06/2021   AKI (acute kidney injury) ([place]) 12/04/2021   Hyponatremia 12/04/2021   Cancer with unknown primary site [facility]) 10/11/2021   Goals of care, counseling/discussion 10/11/2021   Chest pain  09/21/2021   Recent cerebrovascular accident (CVA) 09/21/2021   Interatrial cardiac shunt 24/26/8341   Acute embolic stroke ([place]) 96/22/2979   Mixed diabetic hyperlipidemia associated with type 2 diabetes mellitus ([place]) 09/16/2021   Liver masses 09/16/2021   Essential hypertension 03/12/2012   Type 2 diabetes mellitus with hyperglycemia ([place]) 06/15/2008   BPH (benign prostatic hyperplasia) 06/15/2008    REFERRING DIAG: I63.9 (ICD-10-CM) - CVA (cerebral vascular accident) ([place])   THERAPY DIAG:  No diagnosis found.  Rationale for Evaluation and Treatment Rehabilitation  PERTINENT HISTORY: HTN HLD Metastatic adenocarcinoma to liver of unknown primary  Stroke, 1st Nov 4th 2022  PRECAUTIONS: fall risk  SUBJECTIVE: Feeling pretty good, a little tired from the other therapy today  PAIN:  Are you having pain? No   OBJECTIVE:   TODAY'S TREATMENT: 04/19/22 Activity Comments  LAQ 4#,  hip add isometric Seated EOM 3x10  Standing march and hip abduction 3x10 4# At countertop, cues for rep sequencing  Tandem Stance 2x30 sec ea foot leading At countertop, heavy reliance on UE support  Sidestepping along countertop 2x2 min 4# ankle weights  Static standing on airex pad 3 x 30 sec intervals Wide BOS, narrow BOS, head turns, eyes closed,   Marching on airex pad x 60 sec BUE support needed  COGNITION: Overall cognitive status: Impaired: Attention: Impaired: Focused, Sustained, Divided, Memory: Impaired: Immediate Procedural, and Awareness: Impaired: Anticipatory             SENSATION: Light touch: Impaired  Proprioception: Impaired    COORDINATION: Difficulty with rapid alternating movements       MUSCLE TONE: no spasticity present, possibly some hypotonia LLE         POSTURE: No Significant postural limitations   LOWER EXTREMITY ROM:    No limitations noted   LOWER EXTREMITY MMT:     MMT Right Eval Left Eval  Hip flexion 5 3+  Hip extension      Hip  abduction 5 3+  Hip adduction 5 4  Hip internal rotation      Hip external rotation      Knee flexion      Knee extension 5 3+  Ankle dorsiflexion 5 4  Ankle plantarflexion      Ankle inversion      Ankle eversion      (Blank rows = not tested)   BED MOBILITY:  Independent   TRANSFERS: Assistive device utilized: Environmental consultant - 2 wheeled  Sit to stand: Modified independence Stand to sit: Modified independence Chair to chair: Modified independence Floor:  DNT     STAIRS:           Level of Assistance: SBA           Stair Negotiation Technique: Step to Pattern with Single Rail on Right           Number of Stairs: 4             Height of Stairs: 4-6  Comments: decreased speed   GAIT: Gait pattern: step through pattern and poor foot clearance- Left Distance walked: 150 Assistive device utilized: Walker - 2 wheeled Level of assistance: SBA Comments:    FUNCTIONAL TESTs:      [facility] PT Assessment - 04/13/22 0001                Balance    Balance Assessed Yes          Static Standing Balance    Static Standing - Comment/# of Minutes --          Standardized Balance Assessment    Standardized Balance Assessment Berg Balance Test;Timed Up and Go Test;Five Times Sit to Stand     Five times sit to stand comments  19.37          Berg Balance Test    Sit to Stand Able to stand  independently using hands     Standing Unsupported Able to stand safely 2 minutes     Sitting with Back Unsupported but Feet Supported on Floor or Stool Able to sit safely and securely 2 minutes     Stand to Sit Sits safely with minimal use of hands     Transfers Able to transfer safely, minor use of hands     Standing Unsupported with Eyes Closed Able to stand 10 seconds with supervision     Standing Unsupported with Feet Together Able to place feet together independently and stand for 1 minute with supervision     From Standing, Reach Forward with Outstretched Arm Can reach forward >12 cm safely (5")      From Standing Position, Pick up Object from Floor Able to pick up shoe, needs supervision     From Standing Position, Turn to Look Behind Over each Shoulder Turn sideways only  but maintains balance     Turn 360 Degrees Needs close supervision or verbal cueing     Standing Unsupported, Alternately Place Feet on Step/Stool Able to complete >2 steps/needs minimal assist     Standing Unsupported, One Foot in [employer] balance while stepping or standing     Standing on One Leg Tries to lift leg/unable to hold 3 seconds but remains standing independently     Total Score 36          Timed Up and Go Test    Normal TUG (seconds) 26.1                    M-CTSIB  Condition 1: Firm Surface, EO 30 Sec, Mild Sway  Condition 2: Firm Surface, EC 30 Sec, Moderate Sway  Condition 3: Foam Surface, EO 20 Sec, Severe Sway  Condition 4: Foam Surface, EC 5 Sec,  LOB  Sway    PATIENT SURVEYS:      TODAY'S TREATMENT:  PT evaluation/assessment Tandem stance 3x30 sec at counter Marching 3x10 at counter     PATIENT EDUCATION: Education details: fall risk assessments and HEP initiation Person educated: Patient and Spouse Education method: Explanation and Demonstration Education comprehension: verbalized understanding     HOME EXERCISE PROGRAM: Tandem stance 3x30 sec at counter March at counter 3x10  Access Code: [email or web address] URL: [email or web address] Date: 04/17/2022 Prepared by: Sherlyn Lees  Exercises - Side Stepping with Counter Support  - 1 x daily - 7 x weekly - 2 sets - 2 min hold - Standing Hip Abduction with Counter Support  - 1 x daily - 7 x weekly - 3 sets - 10 reps - Seated Long Arc Quad  - 1 x daily - 7 x weekly - 3 sets - 10 reps - 2 sec hold     GOALS: Goals reviewed with patient? Yes   SHORT TERM GOALS: Target date: 05/11/2022   Patient will be independent in HEP to improve functional outcomes Baseline: Goal status: INITIAL   2.  Patient will  demonstrate score 45/56 Berg Balance Test to manifest low risk for falls Baseline: 36/56 Goal status: INITIAL       LONG TERM GOALS: Target date: 06/08/2022   Demonstrate improved BLE strength and balance as evidenced by time of 15 sec for 5xSTS test Baseline: 19+ sec Goal status: INITIAL   2.  Patient will achieve 12 seconds for TUG test to manifest reduced risk for falls Baseline: 23 w/ RW Goal status: INITIAL   3.  Demonstrate improved proprioception and static balance as evidenced by ability to maintain minimal sway condition 4 M-CTSIB Baseline: complete LOB Goal status: INITIAL       ASSESSMENT:   CLINICAL IMPRESSION: Good tolerance to [place] PRE with 4 lbs ankle weights requiring cues/direction for proper rep sequence during standing activities and for attention to task of reps/sets in sitting. Continues to exhibit some safety awareness deficits requiring prompts to proper use of RW and position. Re-direction to task needed about 50% of trials. Unsteadiness on compliant surface and demo LOB with eyes closed and limited use of righting reactions to correct. Continued sessions indicated to progress strength, balance, and gait strategies to reduce risk for falls.      OBJECTIVE IMPAIRMENTS Abnormal gait, decreased activity tolerance, decreased balance, decreased cognition, decreased coordination, decreased knowledge of use of DME, decreased mobility, difficulty walking, decreased strength, decreased safety awareness, and impaired tone.    ACTIVITY LIMITATIONS carrying, lifting, bending, standing, squatting,  stairs, transfers, reach over head, and locomotion level   PARTICIPATION LIMITATIONS: meal prep, cleaning, laundry, interpersonal relationship, driving, shopping, and yard work   PERSONAL FACTORS Age, Time since onset of injury/illness/exacerbation, and 1-2 comorbidities: CA and hx of CVA  are also affecting patient's functional outcome.    REHAB POTENTIAL: Good   CLINICAL  DECISION MAKING: Evolving/moderate complexity   EVALUATION COMPLEXITY: Moderate   PLAN: PT FREQUENCY: 1-2x/week   PT DURATION: 8 weeks   PLANNED INTERVENTIONS: Therapeutic exercises, Therapeutic activity, Neuromuscular re-education, Balance training, Gait training, Patient/Family education, Joint mobilization, Vestibular training, Canalith repositioning, DME instructions, Wheelchair mobility training, Splintting, Taping, and Manual therapy   PLAN FOR NEXT SESSION:  HEP review, stride stance sit-stand (alternating foot placement ea set)  11:02 AM, 04/19/22 M. Sherlyn Lees, PT, DPT Physical Therapist- [place] Office Number: [phone]

## 2022-04-19 NOTE — Therapy (Signed)
OUTPATIENT SPEECH LANGUAGE PATHOLOGY TREATMENT NOTE   Patient Name: Robert Warner MRN: [id number] DOB:[birth year], 81 y.o., male Today's Date: 04/19/2022  PCP: [name] MD REFERRING PROVIDER: [name] MD  END OF SESSION:   End of Session - 04/19/22 1022     Visit Number 2    Number of Visits 25    Date for SLP Re-Evaluation 07/11/22    Authorization Type humana medicare    SLP Start Time 45    SLP Stop Time  1059    SLP Time Calculation (min) 40 min    Activity Tolerance Patient tolerated treatment well             Past Medical History:  Diagnosis Date   Arthritis    Cancer ([place])    Cataract    Diabetes mellitus    Hyperlipidemia    Stroke [facility])    Past Surgical History:  Procedure Laterality Date   APPENDECTOMY  1968   CATARACT EXTRACTION Bilateral 07/2000   states 2 weeks ago (first of sept) OD due in Dec    COLONOSCOPY  2004,2009   Negative, Dr. Sharlett Iles    IR IMAGING GUIDED PORT INSERTION  10/14/2021   KIDNEY STONE SURGERY  10/2018   PROSTATE BIOPSY  2006   Dr.Sigmund Tannebaum   Patient Active Problem List   Diagnosis Date Noted   Hemiparesis affecting nondominant side as late effect of stroke ([place]) 04/18/2022   Gait abnormality 03/28/2022   Left arm weakness 01/23/2022   UTI (urinary tract infection) 01/23/2022   Cerebrovascular accident (CVA) due to bilateral embolism of posterior cerebral arteries ([place]) 12/30/2021   Right foot drop 12/30/2021   Bacteremia due to Klebsiella pneumoniae 12/07/2021   DVT in Left peroneal vein 12/07/2021   CVA (cerebral vascular accident) ([place]) 12/06/2021   AKI (acute kidney injury) ([place]) 12/04/2021   Hyponatremia 12/04/2021   Cancer with unknown primary site ([place]) 10/11/2021   Goals of care, counseling/discussion 10/11/2021   Chest pain 09/21/2021   Recent cerebrovascular accident (CVA) 09/21/2021   Interatrial cardiac shunt 76/54/6503   Acute embolic stroke ([place]) 54/65/6812   Mixed diabetic  hyperlipidemia associated with type 2 diabetes mellitus ([place]) 09/16/2021   Liver masses 09/16/2021   Essential hypertension 03/12/2012   Type 2 diabetes mellitus with hyperglycemia ([place]) 06/15/2008   BPH (benign prostatic hyperplasia) 06/15/2008    ONSET DATE: Wife reports more cognitive decline after CVA January 2023  REFERRING DIAG: I63.9 (ICD-10-CM) - CVA (cerebral vascular accident) ([place]), Metastatic adenocarcinoma  THERAPY DIAG:  Cognitive communication deficit  Rationale for Evaluation and Treatment Rehabilitation  SUBJECTIVE: "I forgot what I was doing."  PAIN:  Are you having pain? No     OBJECTIVE:  TODAY'S TREATMENT:   04/19/22: Pt participated in skilled ST services with focus on cognition this date. SLP completed evaluation of cognitive skills using CLQT. He required repetition of direction and consistently voiced, "I don't remember what I am supposed to be doing."  Scores are as follows re:   Attention  15/215  SEVERE  Memory  103/185 MODERATE  Executive Functions  6/ 40 SEVERE  Language  25/37 MILD  Visuospatial Skills  9/105 SEVERE  Clock Drawing 1/13 SEVERE       PATIENT EDUCATION: Education details: Cognitive-communication disorder Person educated: Patient and Spouse Education method: Customer service manager Education comprehension: verbalized understanding, needs further education, and (wife verbalized understanding, and [place] will require further education.     GOALS: Goals reviewed with patient? Yes  SHORT TERM GOALS: Target date: 05/11/2022     Pt will perform a functional task for 3 minutes without evidence of distraction, in 3 sessions Baseline: Goal status: INITIAL   2.   Pt will perform a functional task for 6 minutes with rare min A, in 3 sessions Baseline:  Goal status: INITIAL   3.  Wife/family will demo appropriate cueing back to task when pt exhibits off-task behavior, in 3 sessions Baseline:  Goal status: INITIAL   4.   Pt family will tell SLP 5 tasks to do at home that could improve pt's attention, in 2 sessions Baseline:  Goal status: INITIAL   5.  Pt family will explain modifications to pt environment to reduce distractions which could improve pt's attention, in 2 sessions Baseline:  Goal status: INITIAL     LONG TERM GOALS: Target date: 07/11/22     Pt will perform a functional task for 8 minutes with rare min A, in 3 sessions Baseline:  Goal status: INITIAL   2.  Pt will tindicate knowledge of 3 deficit areas in response to SLP yes/no questions Baseline:  Goal status: INITIAL   3.  Pt will demo knowledge of a simple memory system Baseline:  Goal status: INITIAL   4.  Pt will turn to appropriate section in a 2-3 section memory system 80% of the time Baseline:  Goal status: INITIAL   5.  Pt will tell family it is time for his medication with the use of alarms, for 5/7 days  Baseline:  Goal status: INITIAL   ASSESSMENT:   CLINICAL IMPRESSION: Patient is a 81 y.o. male who was seen today for assessment of cognitive linguistic skills. CLQT was completed this session. Pt showed significant deficits in all areas of cognitive linguistics stemming from dense deficits in attention and awareness. Pt will benefit from skilled ST to rehabilitate these deficits. Primary focus of therapy will be for pt's sustained and selective attention. SLP suspects pt is affected by internal and external distraction.   OBJECTIVE IMPAIRMENTS include attention, memory, awareness, executive functioning, and aphasia. These impairments are limiting patient from managing medications, managing appointments, managing finances, household responsibilities, ADLs/IADLs, and effectively communicating at home and in community. Factors affecting potential to achieve goals and functional outcome are ability to learn/carryover information, cooperation/participation level, previous level of function, and severity of impairments.. Patient  will benefit from skilled SLP services to address above impairments and improve overall function.   REHAB POTENTIAL: Fair due to pt's low level of attention and awareness/insight   PLAN: SLP FREQUENCY: 2x/week   SLP DURATION: 12 weeks   PLANNED INTERVENTIONS: Environmental controls, Cueing hierachy, Cognitive reorganization, Internal/external aids, Multimodal communication approach, SLP instruction and feedback, Compensatory strategies, and Patient/family education    [place], [name] 04/19/2022, 10:59 AM

## 2022-04-21 ENCOUNTER — Inpatient Hospital Stay: Payer: Medicare HMO

## 2022-04-21 ENCOUNTER — Inpatient Hospital Stay: Payer: Medicare HMO | Admitting: Nurse Practitioner

## 2022-04-25 ENCOUNTER — Inpatient Hospital Stay: Payer: Medicare HMO | Admitting: Licensed Clinical Social Worker

## 2022-04-25 DIAGNOSIS — C801 Malignant (primary) neoplasm, unspecified: Secondary | ICD-10-CM

## 2022-04-25 NOTE — Progress Notes (Signed)
[facility] CSW Progress Note  [facility] representative spoke with caregiver to discuss concerns in reference to assisted living and adjusting to patient's increased care needs.  Ms. Tift stated she and the patient went to visit assisted living facility and was told there was a vacancy but she has to decide within 24 hours.  Ms. Eoff expressed concerns about the rushed nature of the decision she had to make. Ms. Danner reported "I am feeling overwhelmed with the amount of things that need to be done." CSW and Ms. Gaillard discussed asking her daughter's for support in the decision making process and organizing the move.  Ms. Lanese stated her daughters are very busy and she did not feel comfortable with making such a decision so quickly.  CSW and Ms. Gorney discussed if moving to the assisted living facility was an immediate need and whether she would be more comfortable taking more time to make the decision to move.  Ms. Urieta agreed she would like to take more time to make this decision.  CSW encouraged Ms. Bamberg to contact the facility and let them know about her concerns and to delay the decision for a couple of months.  Ms. Copado agreed and stated she preferred to do it this way.  CSW and [place] discussed different ways to begin the process of organizing the home for a future move to an assisted living facility and contact a private caregiver to assist with Mr. Zingg care needs.  CSW agreed to email Ms. Horlacher a private duty care giver list to [email or web address]. CSW updated Ms. Bromell about CSW moving to another campus and gave her the name and contact information of new [facility] CSW.  CSW encouraged Ms. Bolio to contact CSW with any further questions or concerns.  Ms. Hinderman verbalized understanding.    Adelene Amas, LCSW

## 2022-04-26 ENCOUNTER — Ambulatory Visit: Payer: Medicare HMO

## 2022-04-26 ENCOUNTER — Ambulatory Visit: Payer: Medicare HMO | Admitting: Speech Pathology

## 2022-04-26 ENCOUNTER — Encounter: Payer: Self-pay | Admitting: Speech Pathology

## 2022-04-26 DIAGNOSIS — M6281 Muscle weakness (generalized): Secondary | ICD-10-CM

## 2022-04-26 DIAGNOSIS — I69354 Hemiplegia and hemiparesis following cerebral infarction affecting left non-dominant side: Secondary | ICD-10-CM | POA: Diagnosis not present

## 2022-04-26 DIAGNOSIS — R293 Abnormal posture: Secondary | ICD-10-CM | POA: Diagnosis not present

## 2022-04-26 DIAGNOSIS — R41840 Attention and concentration deficit: Secondary | ICD-10-CM | POA: Diagnosis not present

## 2022-04-26 DIAGNOSIS — R2689 Other abnormalities of gait and mobility: Secondary | ICD-10-CM | POA: Diagnosis not present

## 2022-04-26 DIAGNOSIS — R278 Other lack of coordination: Secondary | ICD-10-CM | POA: Diagnosis not present

## 2022-04-26 DIAGNOSIS — I63433 Cerebral infarction due to embolism of bilateral posterior cerebral arteries: Secondary | ICD-10-CM | POA: Diagnosis not present

## 2022-04-26 DIAGNOSIS — R2681 Unsteadiness on feet: Secondary | ICD-10-CM | POA: Diagnosis not present

## 2022-04-26 DIAGNOSIS — R41841 Cognitive communication deficit: Secondary | ICD-10-CM | POA: Diagnosis not present

## 2022-04-26 NOTE — Therapy (Signed)
OUTPATIENT SPEECH LANGUAGE PATHOLOGY TREATMENT NOTE   Patient Name: Robert Warner MRN: [id number] DOB:Jul 24, 1941, 81 y.o., male Today's Date: 04/26/2022  PCP: [name] MD REFERRING PROVIDER: [name] MD  END OF SESSION:   End of Session - 04/26/22 1423     Visit Number 3    Number of Visits 25    Date for SLP Re-Evaluation 07/11/22    Authorization Type human[employer] medicare    SLP Start Time 1400    SLP Stop Time  1440    SLP Time Calculation (min) 40 min    Activity Tolerance Patient tolerated treatment well             Past Medical History:  Diagnosis Date   Arthritis    Cancer ([place])    Cataract    Diabetes mellitus    Hyperlipidemia    Stroke [facility])    Past Surgical History:  Procedure Laterality Date   APPENDECTOMY  1968   CATARACT EXTRACTION Bilateral 07/2000   states 2 weeks ago (first of sept) OD due in Dec    COLONOSCOPY  2004,2009   Negative, Dr. Sharlett Iles    IR IMAGING GUIDED PORT INSERTION  10/14/2021   KIDNEY STONE SURGERY  10/2018   PROSTATE BIOPSY  2006   Dr.Sigmund Tannebaum   Patient Active Problem List   Diagnosis Date Noted   Hemiparesis affecting nondominant side as late effect of stroke ([place]) 04/18/2022   Gait abnormality 03/28/2022   Left arm weakness 01/23/2022   UTI (urinary tract infection) 01/23/2022   Cerebrovascular accident (CVA) due to bilateral embolism of posterior cerebral arteries ([place]) 12/30/2021   Right foot drop 12/30/2021   Bacteremia due to Klebsiella pneumoniae 12/07/2021   DVT in Left peroneal vein 12/07/2021   CVA (cerebral vascular accident) ([place]) 12/06/2021   AKI (acute kidney injury) ([place]) 12/04/2021   Hyponatremia 12/04/2021   Cancer with unknown primary site ([place]) 10/11/2021   Goals of care, counseling/discussion 10/11/2021   Chest pain 09/21/2021   Recent cerebrovascular accident (CVA) 09/21/2021   Interatrial cardiac shunt 63/78/5885   Acute embolic stroke ([place]) 02/77/4128   Mixed diabetic  hyperlipidemia associated with type 2 diabetes mellitus ([place]) 09/16/2021   Liver masses 09/16/2021   Essential hypertension 03/12/2012   Type 2 diabetes mellitus with hyperglycemia ([place]) 06/15/2008   BPH (benign prostatic hyperplasia) 06/15/2008    ONSET DATE: Wife reports more cognitive decline after CVA January 2023  REFERRING DIAG: I63.9 (ICD-10-CM) - CVA (cerebral vascular accident) ([place]), Metastatic adenocarcinoma  THERAPY DIAG:  Cognitive communication deficit  Rationale for Evaluation and Treatment Rehabilitation  SUBJECTIVE: "I don't know what I'm doing." (During tx task)  PAIN:  Are you having pain? Yes Stomach pain, 6/10  Aching Intermittent      OBJECTIVE:  TODAY'S TREATMENT:   04/26/22: SLP began session by facilitating attention during a structured conversation regarding the [employer] and his previous occupation. As opposed to previous session, pt was able to converse and appropriately ask/ answer questions with minA verbal cues to remain on topic. After 15 minutes of conversation, communication breakdown started to occur. Pt had difficulty answering basic biographical questions regarding him and his wife's first meeting. Pt tends to deflect confusion with humor; therefore, SLP gently redirected pt to question. With redirection, pt was able to answer appropriately. SLP and pt discussed the possibility of him moving to ALF. SLP asked about what pt currently does around the house. He reported that he unloads and loads the dishwasher at home to  help his wife at home; however, reports that she does 99% of the work.   SLP further facilitated attention by addressing functional task (sorting ingredients into appropriate categories on a grocery list). Pt required modA verbal and gestural cueing to complete task. He also required cueing for visual attention/scanning. When pt began to lose attention and SLP provided brain breaks to reset/refocus.   04/19/22: Pt participated in skilled  ST services with focus on cognition this date. SLP completed evaluation of cognitive skills using CLQT. He required repetition of direction and consistently voiced, "I don't remember what I am supposed to be doing."  Scores are as follows re:   Attention  15/215  SEVERE  Memory  103/185 MODERATE  Executive Functions  6/ 40 SEVERE  Language  25/37 MILD  Visuospatial Skills  9/105 SEVERE  Clock Drawing 1/13 SEVERE       PATIENT EDUCATION: Education details: attention Person educated: Patient  Education method: Customer service manager Education comprehension: verbalized understanding, needs further education, and (wife verbalized understanding, and [place] will require further education.     GOALS: Goals reviewed with patient? Yes   SHORT TERM GOALS: Target date: 05/11/2022     Pt will perform a functional task for 3 minutes without evidence of distraction, in 3 sessions Baseline: Goal status: INITIAL   2.   Pt will perform a functional task for 6 minutes with rare min A, in 3 sessions Baseline:  Goal status: INITIAL   3.  Wife/family will demo appropriate cueing back to task when pt exhibits off-task behavior, in 3 sessions Baseline:  Goal status: INITIAL   4.  Pt family will tell SLP 5 tasks to do at home that could improve pt's attention, in 2 sessions Baseline:  Goal status: INITIAL   5.  Pt family will explain modifications to pt environment to reduce distractions which could improve pt's attention, in 2 sessions Baseline:  Goal status: INITIAL     LONG TERM GOALS: Target date: 07/11/22     Pt will perform a functional task for 8 minutes with rare min A, in 3 sessions Baseline:  Goal status: INITIAL   2.  Pt will tindicate knowledge of 3 deficit areas in response to SLP yes/no questions Baseline:  Goal status: INITIAL   3.  Pt will demo knowledge of a simple memory system Baseline:  Goal status: INITIAL   4.  Pt will turn to appropriate section in a 2-3  section memory system 80% of the time Baseline:  Goal status: INITIAL   5.  Pt will tell family it is time for his medication with the use of alarms, for 5/7 days  Baseline:  Goal status: INITIAL   ASSESSMENT:   CLINICAL IMPRESSION: Patient is a 81 y.o. male with diagnosis of cog-comm impairment. See tx note.  Pt showed significant deficits in all areas of cognitive linguistics stemming from dense deficits in attention and awareness. Pt will benefit from skilled ST to rehabilitate these deficits. Primary focus of therapy will be for pt's sustained and selective attention. SLP suspects pt is affected by internal and external distraction.   OBJECTIVE IMPAIRMENTS include attention, memory, awareness, executive functioning, and aphasia. These impairments are limiting patient from managing medications, managing appointments, managing finances, household responsibilities, ADLs/IADLs, and effectively communicating at home and in community. Factors affecting potential to achieve goals and functional outcome are ability to learn/carryover information, cooperation/participation level, previous level of function, and severity of impairments.. Patient will benefit from skilled SLP services  to address above impairments and improve overall function.   REHAB POTENTIAL: Fair due to pt's low level of attention and awareness/insight   PLAN: SLP FREQUENCY: 2x/week   SLP DURATION: 12 weeks   PLANNED INTERVENTIONS: Environmental controls, Cueing hierachy, Cognitive reorganization, Internal/external aids, Multimodal communication approach, SLP instruction and feedback, Compensatory strategies, and Patient/family education    [place], [place] 04/26/2022, 2:24 PM

## 2022-04-26 NOTE — Therapy (Signed)
OUTPATIENT PHYSICAL THERAPY TREATMENT NOTE   Patient Name: Robert Warner MRN: [id number] DOB:1941-07-26, 81 y.o., male Today's Date: 04/26/2022  PCP: none listed REFERRING PROVIDER: [name], MD  END OF SESSION:   PT End of Session - 04/26/22 1311     Visit Number 4    Number of Visits 16    Date for PT Re-Evaluation 06/08/22    Authorization Type [employer] 2023    Authorization Time Period auth required via [employer]    PT Start Time 1315    PT Stop Time 1400    PT Time Calculation (min) 45 min    Equipment Utilized During Treatment Gait belt    Activity Tolerance Patient tolerated treatment well    Behavior During Therapy WFL for tasks assessed/performed             Past Medical History:  Diagnosis Date   Arthritis    Cancer ([place])    Cataract    Diabetes mellitus    Hyperlipidemia    Stroke [facility])    Past Surgical History:  Procedure Laterality Date   APPENDECTOMY  1968   CATARACT EXTRACTION Bilateral 07/2000   states 2 weeks ago (first of sept) OD due in Dec    COLONOSCOPY  2004,2009   Negative, Dr. Sharlett Iles    IR IMAGING GUIDED PORT INSERTION  10/14/2021   KIDNEY STONE SURGERY  10/2018   PROSTATE BIOPSY  2006   Dr.Sigmund Tannebaum   Patient Active Problem List   Diagnosis Date Noted   Hemiparesis affecting nondominant side as late effect of stroke ([place]) 04/18/2022   Gait abnormality 03/28/2022   Left arm weakness 01/23/2022   UTI (urinary tract infection) 01/23/2022   Cerebrovascular accident (CVA) due to bilateral embolism of posterior cerebral arteries ([place]) 12/30/2021   Right foot drop 12/30/2021   Bacteremia due to Klebsiella pneumoniae 12/07/2021   DVT in Left peroneal vein 12/07/2021   CVA (cerebral vascular accident) ([place]) 12/06/2021   AKI (acute kidney injury) ([place]) 12/04/2021   Hyponatremia 12/04/2021   Cancer with unknown primary site [facility]) 10/11/2021   Goals of care, counseling/discussion 10/11/2021   Chest pain  09/21/2021   Recent cerebrovascular accident (CVA) 09/21/2021   Interatrial cardiac shunt 95/63/8756   Acute embolic stroke ([place]) 43/32/9518   Mixed diabetic hyperlipidemia associated with type 2 diabetes mellitus ([place]) 09/16/2021   Liver masses 09/16/2021   Essential hypertension 03/12/2012   Type 2 diabetes mellitus with hyperglycemia ([place]) 06/15/2008   BPH (benign prostatic hyperplasia) 06/15/2008    REFERRING DIAG: I63.9 (ICD-10-CM) - CVA (cerebral vascular accident) ([place])   THERAPY DIAG:  Muscle weakness (generalized)  Unsteadiness on feet  Other abnormalities of gait and mobility  Rationale for Evaluation and Treatment Rehabilitation  PERTINENT HISTORY: HTN HLD Metastatic adenocarcinoma to liver of unknown primary  Stroke, 1st Nov 4th 2022  PRECAUTIONS: fall risk  SUBJECTIVE: Feeling well enough, pain in stomach after eating  PAIN:  Are you having pain? No   OBJECTIVE:    TODAY'S TREATMENT: 04/26/22 Activity Comments  LAQ 5# 3x10 Cues and re-direction in sequence for reps/sets  Hip add isometric 3x10   Standing hip flexion 3x10, countertop support 5# ankle weights    Sidestepping along counter 2x2 min, 5# ankle Cues for step width vs height  Static standing on airex pad, 2x30 sec intervals  EO; EC; feet together EO/EC; head turns EO/EC  Retro-walk 2x 2 min, no AD CGA with frequent unsteadiness, delayed righting reactions  COGNITION: Overall cognitive status: Impaired: Attention: Impaired: Focused, Sustained, Divided, Memory: Impaired: Immediate Procedural, and Awareness: Impaired: Anticipatory             SENSATION: Light touch: Impaired  Proprioception: Impaired    COORDINATION: Difficulty with rapid alternating movements       MUSCLE TONE: no spasticity present, possibly some hypotonia LLE         POSTURE: No Significant postural limitations   LOWER EXTREMITY ROM:    No limitations noted   LOWER EXTREMITY MMT:     MMT  Right Eval Left Eval  Hip flexion 5 3+  Hip extension      Hip abduction 5 3+  Hip adduction 5 4  Hip internal rotation      Hip external rotation      Knee flexion      Knee extension 5 3+  Ankle dorsiflexion 5 4  Ankle plantarflexion      Ankle inversion      Ankle eversion      (Blank rows = not tested)   BED MOBILITY:  Independent   TRANSFERS: Assistive device utilized: Environmental consultant - 2 wheeled  Sit to stand: Modified independence Stand to sit: Modified independence Chair to chair: Modified independence Floor:  DNT     STAIRS:           Level of Assistance: SBA           Stair Negotiation Technique: Step to Pattern with Single Rail on Right           Number of Stairs: 4             Height of Stairs: 4-6  Comments: decreased speed   GAIT: Gait pattern: step through pattern and poor foot clearance- Left Distance walked: 150 Assistive device utilized: Walker - 2 wheeled Level of assistance: SBA Comments:    FUNCTIONAL TESTs:      [facility] PT Assessment - 04/13/22 0001                Balance    Balance Assessed Yes          Static Standing Balance    Static Standing - Comment/# of Minutes --          Standardized Balance Assessment    Standardized Balance Assessment Berg Balance Test;Timed Up and Go Test;Five Times Sit to Stand     Five times sit to stand comments  19.37          Berg Balance Test    Sit to Stand Able to stand  independently using hands     Standing Unsupported Able to stand safely 2 minutes     Sitting with Back Unsupported but Feet Supported on Floor or Stool Able to sit safely and securely 2 minutes     Stand to Sit Sits safely with minimal use of hands     Transfers Able to transfer safely, minor use of hands     Standing Unsupported with Eyes Closed Able to stand 10 seconds with supervision     Standing Unsupported with Feet Together Able to place feet together independently and stand for 1 minute with supervision     From Standing, Reach  Forward with Outstretched Arm Can reach forward >12 cm safely (5")     From Standing Position, Pick up Object from Floor Able to pick up shoe, needs supervision     From Standing Position, Turn to Look Behind Over each Shoulder Turn sideways only  but maintains balance     Turn 360 Degrees Needs close supervision or verbal cueing     Standing Unsupported, Alternately Place Feet on Step/Stool Able to complete >2 steps/needs minimal assist     Standing Unsupported, One Foot in [employer] balance while stepping or standing     Standing on One Leg Tries to lift leg/unable to hold 3 seconds but remains standing independently     Total Score 36          Timed Up and Go Test    Normal TUG (seconds) 26.1                    M-CTSIB  Condition 1: Firm Surface, EO 30 Sec, Mild Sway  Condition 2: Firm Surface, EC 30 Sec, Moderate Sway  Condition 3: Foam Surface, EO 20 Sec, Severe Sway  Condition 4: Foam Surface, EC 5 Sec,  LOB  Sway    PATIENT SURVEYS:      TODAY'S TREATMENT:  PT evaluation/assessment Tandem stance 3x30 sec at counter Marching 3x10 at counter     PATIENT EDUCATION: Education details: fall risk assessments and HEP initiation Person educated: Patient and Spouse Education method: Explanation and Demonstration Education comprehension: verbalized understanding     HOME EXERCISE PROGRAM: Tandem stance 3x30 sec at counter March at counter 3x10  Access Code: [email or web address] URL: [email or web address] Date: 04/17/2022 Prepared by: Sherlyn Lees  Exercises - Side Stepping with Counter Support  - 1 x daily - 7 x weekly - 2 sets - 2 min hold - Standing Hip Abduction with Counter Support  - 1 x daily - 7 x weekly - 3 sets - 10 reps - Seated Long Arc Quad  - 1 x daily - 7 x weekly - 3 sets - 10 reps - 2 sec hold     GOALS: Goals reviewed with patient? Yes   SHORT TERM GOALS: Target date: 05/11/2022   Patient will be independent in HEP to improve functional  outcomes Baseline: Goal status: INITIAL   2.  Patient will demonstrate score 45/56 Berg Balance Test to manifest low risk for falls Baseline: 36/56 Goal status: INITIAL       LONG TERM GOALS: Target date: 06/08/2022   Demonstrate improved BLE strength and balance as evidenced by time of 15 sec for 5xSTS test Baseline: 19+ sec Goal status: INITIAL   2.  Patient will achieve 12 seconds for TUG test to manifest reduced risk for falls Baseline: 23 w/ RW Goal status: INITIAL   3.  Demonstrate improved proprioception and static balance as evidenced by ability to maintain minimal sway condition 4 M-CTSIB Baseline: complete LOB Goal status: INITIAL       ASSESSMENT:   CLINICAL IMPRESSION: Progressed with strength activities using 5# ankle weights with fatigue but no obvious straining difficulty.  Requires frequent redirection to sequence and proper form for exercise/task due to confusion and some apraxia.  Unsteadiness present during unsupported standing activities with frequent retro-LOB and delayed righting reactions during retro-walk activity via hip/stepping strategy. Cues throughout session for increased amplitude of movement to improve proximal recruitment. Continued sessions to progress strength and balance activities to reduce risk for falls     OBJECTIVE IMPAIRMENTS Abnormal gait, decreased activity tolerance, decreased balance, decreased cognition, decreased coordination, decreased knowledge of use of DME, decreased mobility, difficulty walking, decreased strength, decreased safety awareness, and impaired tone.    ACTIVITY LIMITATIONS carrying, lifting, bending, standing, squatting, stairs, transfers, reach over head, and locomotion level  PARTICIPATION LIMITATIONS: meal prep, cleaning, laundry, interpersonal relationship, driving, shopping, and yard work   PERSONAL FACTORS Age, Time since onset of injury/illness/exacerbation, and 1-2 comorbidities: CA and hx of CVA  are also  affecting patient's functional outcome.    REHAB POTENTIAL: Good   CLINICAL DECISION MAKING: Evolving/moderate complexity   EVALUATION COMPLEXITY: Moderate   PLAN: PT FREQUENCY: 1-2x/week   PT DURATION: 8 weeks   PLANNED INTERVENTIONS: Therapeutic exercises, Therapeutic activity, Neuromuscular re-education, Balance training, Gait training, Patient/Family education, Joint mobilization, Vestibular training, Canalith repositioning, DME instructions, Wheelchair mobility training, Splintting, Taping, and Manual therapy   PLAN FOR NEXT SESSION:  HEP review, stride stance sit-stand (alternating foot placement ea set)  1:20 PM, 04/26/22 M. Sherlyn Lees, PT, DPT Physical Therapist- [place] Office Number: [phone]

## 2022-04-28 ENCOUNTER — Ambulatory Visit: Payer: Medicare HMO | Admitting: Occupational Therapy

## 2022-04-28 ENCOUNTER — Ambulatory Visit: Payer: Medicare HMO

## 2022-04-28 ENCOUNTER — Inpatient Hospital Stay (HOSPITAL_BASED_OUTPATIENT_CLINIC_OR_DEPARTMENT_OTHER): Payer: Medicare HMO | Admitting: Nurse Practitioner

## 2022-04-28 ENCOUNTER — Other Ambulatory Visit: Payer: Self-pay

## 2022-04-28 ENCOUNTER — Encounter: Payer: Self-pay | Admitting: Nurse Practitioner

## 2022-04-28 VITALS — BP 116/79 | HR 95 | Temp 97.9°F | Resp 18 | Ht 71.0 in | Wt 172.0 lb

## 2022-04-28 DIAGNOSIS — R2689 Other abnormalities of gait and mobility: Secondary | ICD-10-CM | POA: Diagnosis not present

## 2022-04-28 DIAGNOSIS — M6281 Muscle weakness (generalized): Secondary | ICD-10-CM | POA: Diagnosis not present

## 2022-04-28 DIAGNOSIS — C787 Secondary malignant neoplasm of liver and intrahepatic bile duct: Secondary | ICD-10-CM | POA: Diagnosis not present

## 2022-04-28 DIAGNOSIS — R2681 Unsteadiness on feet: Secondary | ICD-10-CM

## 2022-04-28 DIAGNOSIS — C801 Malignant (primary) neoplasm, unspecified: Secondary | ICD-10-CM

## 2022-04-28 DIAGNOSIS — R293 Abnormal posture: Secondary | ICD-10-CM

## 2022-04-28 DIAGNOSIS — I129 Hypertensive chronic kidney disease with stage 1 through stage 4 chronic kidney disease, or unspecified chronic kidney disease: Secondary | ICD-10-CM | POA: Diagnosis not present

## 2022-04-28 DIAGNOSIS — R41840 Attention and concentration deficit: Secondary | ICD-10-CM | POA: Diagnosis not present

## 2022-04-28 DIAGNOSIS — I82552 Chronic embolism and thrombosis of left peroneal vein: Secondary | ICD-10-CM | POA: Diagnosis not present

## 2022-04-28 DIAGNOSIS — Z7901 Long term (current) use of anticoagulants: Secondary | ICD-10-CM | POA: Diagnosis not present

## 2022-04-28 DIAGNOSIS — R41841 Cognitive communication deficit: Secondary | ICD-10-CM | POA: Diagnosis not present

## 2022-04-28 DIAGNOSIS — R278 Other lack of coordination: Secondary | ICD-10-CM

## 2022-04-28 DIAGNOSIS — E1122 Type 2 diabetes mellitus with diabetic chronic kidney disease: Secondary | ICD-10-CM | POA: Diagnosis not present

## 2022-04-28 DIAGNOSIS — I63433 Cerebral infarction due to embolism of bilateral posterior cerebral arteries: Secondary | ICD-10-CM | POA: Diagnosis not present

## 2022-04-28 DIAGNOSIS — I69354 Hemiplegia and hemiparesis following cerebral infarction affecting left non-dominant side: Secondary | ICD-10-CM | POA: Diagnosis not present

## 2022-04-28 DIAGNOSIS — N189 Chronic kidney disease, unspecified: Secondary | ICD-10-CM | POA: Diagnosis not present

## 2022-04-28 DIAGNOSIS — D6959 Other secondary thrombocytopenia: Secondary | ICD-10-CM | POA: Diagnosis not present

## 2022-04-28 MED ORDER — MORPHINE SULFATE ER 15 MG PO TBCR: 15.0000 mg | EXTENDED_RELEASE_TABLET | Freq: Two times a day (BID) | ORAL | 0 refills | Status: AC

## 2022-04-28 MED ORDER — HYDROMORPHONE HCL 2 MG PO TABS: 2.0000 mg | ORAL_TABLET | ORAL | 0 refills | Status: AC | PRN

## 2022-04-28 NOTE — Therapy (Signed)
OUTPATIENT OCCUPATIONAL THERAPY NEURO EVALUATION  Patient Name: Robert Warner MRN: [id number] DOB:October 10, 1941, 81 y.o., male Today's Date: 04/28/2022  PCP: [name], MD REFERRING PROVIDER: [name], MD   OT End of Session - 04/28/22 0845     Visit Number 3    Number of Visits 17    Date for OT Re-Evaluation 06/16/22    Authorization Type [employer]    OT Start Time 0845    OT Stop Time 0925    OT Time Calculation (min) 40 min    Activity Tolerance Patient tolerated treatment well    Behavior During Therapy WFL for tasks assessed/performed               Past Medical History:  Diagnosis Date   Arthritis    Cancer ([place])    Cataract    Diabetes mellitus    Hyperlipidemia    Stroke [facility])    Past Surgical History:  Procedure Laterality Date   APPENDECTOMY  1968   CATARACT EXTRACTION Bilateral 07/2000   states 2 weeks ago (first of sept) OD due in Dec    COLONOSCOPY  2004,2009   Negative, Dr. Sharlett Iles    IR IMAGING GUIDED PORT INSERTION  10/14/2021   KIDNEY STONE SURGERY  10/2018   PROSTATE BIOPSY  2006   Dr.Sigmund Tannebaum   Patient Active Problem List   Diagnosis Date Noted   Hemiparesis affecting nondominant side as late effect of stroke ([place]) 04/18/2022   Gait abnormality 03/28/2022   Left arm weakness 01/23/2022   UTI (urinary tract infection) 01/23/2022   Cerebrovascular accident (CVA) due to bilateral embolism of posterior cerebral arteries ([place]) 12/30/2021   Right foot drop 12/30/2021   Bacteremia due to Klebsiella pneumoniae 12/07/2021   DVT in Left peroneal vein 12/07/2021   CVA (cerebral vascular accident) ([place]) 12/06/2021   AKI (acute kidney injury) ([place]) 12/04/2021   Hyponatremia 12/04/2021   Cancer with unknown primary site [facility]) 10/11/2021   Goals of care, counseling/discussion 10/11/2021   Chest pain 09/21/2021   Recent cerebrovascular accident (CVA) 09/21/2021   Interatrial cardiac shunt 40/98/1191   Acute  embolic stroke ([place]) 47/82/9562   Mixed diabetic hyperlipidemia associated with type 2 diabetes mellitus ([place]) 09/16/2021   Liver masses 09/16/2021   Essential hypertension 03/12/2012   Type 2 diabetes mellitus with hyperglycemia ([place]) 06/15/2008   BPH (benign prostatic hyperplasia) 06/15/2008    ONSET DATE: 01/23/2022  REFERRING DIAG: I63.9 (ICD-10-CM) - CVA (cerebral vascular accident)   THERAPY DIAG:  Muscle weakness (generalized)  Unsteadiness on feet  Other lack of coordination  Attention and concentration deficit  Hemiplegia and hemiparesis following cerebral infarction affecting left non-dominant side (HCC)  Rationale for Evaluation and Treatment Rehabilitation  SUBJECTIVE:   SUBJECTIVE STATEMENT: Pt reports his stomach hurts after he eats. Pt accompanied by: self   PAIN:  Are you having pain? No  PATIENT GOALS to be more independent  OBJECTIVE:   Today's Treatment: Coordination: engaged in flipping cards with L hand with focus on coordination, visual attention, and sustained attention to task.  Therapist directed pt to match cards by number and place them on table in order from smallest to largest to challenge memory and attention.  Pt able to match cards by number, however demonstrating increased difficulty with "6" and "9" and placing them in random order, not numerical order.  Mod cues for location of card stacks due to decreased memory.  Pt easily (internally) distracted and demonstrating decreased organization during card  stacking task.  Also of note, pt not paying attention to R side of table when scanning for matches.  Pt demonstrating good gross grasp, however demonstrating decreased placement of cards - question whether it was more cognitive, attention, or coordination.  Pt required more than reasonable amount of time to complete task. [place]: picking up variety of small items one at a time and placing in container.  Pt initially sliding items off table,  requiring cues to attempt to pick them up.  Pt then demonstrating improved ability to pick up items.  Increased challenge to stacking coins with pt able to complete with good pinch. Pt demonstrating decreased coordination and motor control when attempting to unstack.  In-hand manipulation and translation with picking up coins one at a time to place in palm and then translate to finger tips to place one at a time into coin slot.  Pt dropping 2 of 7 during first set then dropping 0 of 7 during the next attempt due to increased attention to task.   PATIENT EDUCATION: Education details: Educated on attention and initiation during structured tasks and functional use of LUE at diminished level. Person educated: Patient Education method: Explanation Education comprehension: verbalized understanding   HOME EXERCISE PROGRAM: Coordination HEP - see pt instructions    GOALS: Goals reviewed with patient? Yes  SHORT TERM GOALS: Target date: 05/19/2022  STG   Status:  1 Pt will be independent with coordination HEP to increase independence with ADLs and IADLs. Baseline:  IN PROGRESS  2 Pt will complete table top scanning activity with min cues for technique and recognition of errors. Baseline:  IN PROGRESS  3 Pt will demonstrate ability to sequence simple functional task (simple snack prep, laundry task, etc) with supervision Baseline:  IN PROGRESS  4 Pt will demonstrate improved fine motor coordination for ADLs as evidenced by decreasing 9 hole peg test score for LUE by 10 secs Baseline: Right: 39.94 sec; Left: 1:02.85 sec IN PROGRESS   LONG TERM GOALS: Target date: 06/16/2022  LTG   Status:  1 Pt will navigate a moderately busy environment, following multi-step commands with 90% accuracy Baseline:  IN PROGRESS  2 Pt will be able to complete UB dressing with supervision and <2 cues demonstrating improved awareness of errors and sequencing of tasks. Baseline:  IN PROGRESS  3 Pt will demonstrate  improved engagement and motivation to engage in leisure tasks by report of completing 2 familiar tasks with < min encouragement. Baseline:  IN PROGRESS  4 Pt will demonstrate improved fine motor coordination for ADLs as evidenced by decreasing 9 hole peg test score for LUE by 15 secs Baseline: Right: 39.94 sec; Left: 1:02.85 sec IN PROGRESS  5 Pt will demonstrate improved UE functional use for ADLs as evidenced by increasing box/ blocks score by 5 blocks with BUE Baseline: Right 25 blocks, Left 19 blocks  IN PROGRESS    ASSESSMENT:  CLINICAL IMPRESSION: Pt required more than reasonable amount of time and cues for initiation, sequencing, and organization during table top task.   Pt easily (internally) distracted and demonstrating decreased organization during card stacking task.  Also of note, pt not paying attention to R side of table when scanning for matches.  Pt demonstrating good gross grasp, however demonstrating decreased placement of cards - question whether it was more cognitive, attention, or coordination.  Pt also limited in participation due to stomach issues, limiting activity tolerance and endurance as well as focus due to distraction.  Provided pt with  written HEP for coordination tasks as completed above to continue to focus on coordination and attention to task.  PERFORMANCE DEFICITS in functional skills including ADLs, IADLs, coordination, proprioception, sensation, ROM, strength, FMC, GMC, balance, body mechanics, endurance, decreased knowledge of precautions, and UE functional use, cognitive skills including attention, memory, problem solving, and sequencing.  IMPAIRMENTS are limiting patient from ADLs and IADLs.   COMORBIDITIES may have co-morbidities  that affects occupational performance. Patient will benefit from skilled OT to address above impairments and improve overall function.  MODIFICATION OR ASSISTANCE TO COMPLETE EVALUATION: Min-Moderate modification of tasks or  assist with assess necessary to complete an evaluation.  OT OCCUPATIONAL PROFILE AND HISTORY: Detailed assessment: Review of records and additional review of physical, cognitive, psychosocial history related to current functional performance.  CLINICAL DECISION MAKING: Moderate - several treatment options, min-mod task modification necessary  REHAB POTENTIAL: Good  EVALUATION COMPLEXITY: Moderate    PLAN: OT FREQUENCY: 2x/week  OT DURATION: 8 weeks  PLANNED INTERVENTIONS: self care/ADL training, therapeutic exercise, therapeutic activity, neuromuscular re-education, manual therapy, balance training, functional mobility training, electrical stimulation, ultrasound, moist heat, cryotherapy, patient/family education, cognitive remediation/compensation, visual/perceptual remediation/compensation, psychosocial skills training, energy conservation, and DME and/or AE instructions  RECOMMENDED OTHER SERVICES: N/A  CONSULTED AND AGREED WITH PLAN OF CARE: Patient and family member/caregiver  PLAN FOR NEXT SESSION: engage in table top tasks with focus on attention and increasing to dual task as able, LUE shoulder strengthening with functional reach   [place], [place], OTR/L 04/28/2022, 9:40 AM

## 2022-04-28 NOTE — Progress Notes (Signed)
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  He reports pain is poorly controlled.  Percocet is not effective.  Bowels are moving.  Appetite is poor.  Objective:  Vital signs in last 24 hours:  Blood pressure 116/79, pulse 95, temperature 97.9 F (36.6 C), temperature source Oral, resp. rate 18, height 5' 11" (1.803 m), weight 172 lb (78 kg), SpO2 98 %.    Resp: Lungs clear bilaterally. Cardio: Regular rate and rhythm. GI: No hepatosplenomegaly. Vascular: No leg edema. Neuro: Alert, mildly confused. Port-A-Cath without erythema.  Lab Results:  Lab Results  Component Value Date   WBC 4.4 04/07/2022   HGB 10.5 (L) 04/07/2022   HCT 32.5 (L) 04/07/2022   MCV 87.6 04/07/2022   PLT 174 04/07/2022   NEUTROABS 2.7 04/07/2022    Imaging:  No results found.  Medications: I have reviewed the patient's current medications.  Assessment/Plan: Poorly differentiated adenocarcinoma involving the liver Abdominal ultrasound 08/05/2021-indeterminate 23m solid mass in the right hepatic lobe.   MRI of the liver 09/06/2021-multifocal rim-enhancing lesions throughout both lobes of the liver.  Index lesion within the lateral dome of right hepatic lobe measures 1.1 cm, segment 4A lesion measures 1.6 x 1.4 cm, segment 2 lesion measures 0.8 x 0.7 cm, posteromedial margin of the right hepatic lobe subcapsular lesion measures 2.5 x 2.0 cm 09/16/2021 CT angio neck-9 mm right upper lobe nodule Biopsy of a right liver mass on 09/23/2021.  Pathology shows poorly differentiated adenocarcinoma positive for cytokeratin 7, CDX2 and cytokeratin 20 and negative for TTF-1, PSA and prostein.  Differential diagnoses include pancreatobiliary and less likely upper GI. HER2 positive by FISH PD-L1 combined positive score 0% MSS equivocal, tumor mutation burden 0, ERBB2 amplification equivocal 09/29/2021 CA 19-9 81, CEA  33 PET scan 10/10/2021-solitary 10 mm right upper lobe pulmonary nodule with minimal FDG uptake.  7.5 mm left internal mammary lymph node hypermetabolic with SUV max 48.25  Numerous hepatic lesions.  Periportal lymphadenopathy SUV max 8.18.  2 adjacent lesions noted in the mesentery in the right mid abdomen with somewhat irregular margins, SUV 8.09.  No hypermetabolic colonic lesion identified to suggest a primary colon cancer.  No pancreatic lesion.  No gastric lesion.  No retroperitoneal lymphadenopathy. Upper endoscopy 10/13/2021-no mass, H. pylori gastritis on gastric biopsy Cycle 1 FOLFOX 10/19/2021 Cycle 2 FOLFOX 11/02/2021, Emend added, oxaliplatin dose reduced Cycle 3 FOLFOX 11/16/2021 Cycle 4 FOLFOX 11/30/2021 CT abdomen/pelvis with contrast on 12/05/2021-stable exam with bilobar hepatic masses, findings suggestive of metastatic gallbladder cancer. CT 01/25/2022-new and enlarging hepatic metastases, abnormal masslike appearance at the gallbladder and porta hepatis, stable left internal mammary node, increased left supraclavicular node, increased size of right upper lobe nodule [employer] 02/17/2022-MSI high not detected,PTEN, RB1, GATA3 alterations Cycle 1 gemcitabine/cisplatin and trastuzumab for 02/22/2022 Cycle 2 gemcitabine/cisplatin and trastuzumab 03/09/2022 Cycle 3 gemcitabine/cisplatin and trastuzumab 03/24/2022 03/26/2022 emergency room evaluation for increased abdominal pain-CT abdomen/pelvis 03/26/2022 with numerous hypodense liver lesions increased in size and number, unchanged soft tissue nodule within the mesentery of the right colon, contracted gallbladder Referred to hospice 04/28/2022 Hospital admission 09/16/2021 - 09/19/2021 with gait disturbance-multifocal embolic stroke on MRI 105/01/9766 echo with interatrial shunt, transcranial Doppler with bubble study indicative of a medium size right to left shunt, lower extremity Doppler studies negative for DVT Hospital admission 09/20/2021 -  09/22/2021 with chest pain-CT coronary with no acute findings.   Diabetes Hypertension BPH Chronic kidney disease  Brain CT 11/18/2021-new "lesion" in the left cerebellum when compared to brain MRI 09/16/2021.  Appearance of rapid development suspicious for a subacute infarct.  Metastatic focus not excluded.  Follow-up brain MRI with contrast recommended in the next 4 to 6 weeks. [facility] admission 0/86/7619-[id number] embolic CVAs, Heparin then apixaban anticoagulation 10.  Klebsiella bacteremia 12/05/2021-no apparent source for infection 11.  Thrombocytopenia secondary to chemotherapy and bacteremia 12.  Left peroneal DVT 12/05/2021 13.  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  Performance status is slowly declining.  We again discussed treatment options.  They understand the small chance of clinical benefit with additional systemic therapy as well as the potential for toxicities with systemic therapy.  We discussed supportive/comfort care with a hospice referral.  He has elected supportive/comfort care.  We made a referral to the home hospice program.  We had initial discussion regarding CODE STATUS.  His wife reports having paperwork at home regarding his wishes.  They will discuss further with the hospice nurse.  His pain is poorly controlled.  He will begin MS Contin 15 mg every 12 hours with Dilaudid 2 to 4 mg every 4 hours for breakthrough pain.  They understand to contact the office if this is not effective.  He will return for a follow-up visit in 3 to 4 weeks.  We are available to see him  sooner if needed.  Patient seen with [name].    Ned Card ANP/GNP-BC   04/28/2022  2:57 PM  This was a shared visit with Ned Card.  We discussed the prognosis and treatment options with [name] again today.  His wife and daughter are present.  We recommend home hospice care.  He agrees.  We adjusted the narcotic regimen today.  I was present for greater than 50% of today's visit.  I performed medical decision making.  Julieanne Manson, MD

## 2022-04-28 NOTE — Therapy (Signed)
OUTPATIENT PHYSICAL THERAPY TREATMENT NOTE   Patient Name: Robert Warner MRN: [id number] DOB:1940-12-01, 81 y.o., male Today's Date: 04/28/2022  PCP: none listed REFERRING PROVIDER: [name], MD  END OF SESSION:   PT End of Session - 04/28/22 0932     Visit Number 5    Number of Visits 16    Date for PT Re-Evaluation 06/08/22    Authorization Type [employer] 2023    Authorization Time Period auth required via Cohere    PT Start Time 0930    PT Stop Time 1015    PT Time Calculation (min) 45 min    Equipment Utilized During Treatment Gait belt    Activity Tolerance Patient tolerated treatment well    Behavior During Therapy WFL for tasks assessed/performed             Past Medical History:  Diagnosis Date   Arthritis    Cancer ([place])    Cataract    Diabetes mellitus    Hyperlipidemia    Stroke [facility])    Past Surgical History:  Procedure Laterality Date   APPENDECTOMY  1968   CATARACT EXTRACTION Bilateral 07/2000   states 2 weeks ago (first of sept) OD due in Dec    COLONOSCOPY  2004,2009   Negative, Dr. Sharlett Iles    IR IMAGING GUIDED PORT INSERTION  10/14/2021   KIDNEY STONE SURGERY  10/2018   PROSTATE BIOPSY  2006   Dr.Sigmund Tannebaum   Patient Active Problem List   Diagnosis Date Noted   Hemiparesis affecting nondominant side as late effect of stroke ([place]) 04/18/2022   Gait abnormality 03/28/2022   Left arm weakness 01/23/2022   UTI (urinary tract infection) 01/23/2022   Cerebrovascular accident (CVA) due to bilateral embolism of posterior cerebral arteries ([place]) 12/30/2021   Right foot drop 12/30/2021   Bacteremia due to Klebsiella pneumoniae 12/07/2021   DVT in Left peroneal vein 12/07/2021   CVA (cerebral vascular accident) ([place]) 12/06/2021   AKI (acute kidney injury) ([place]) 12/04/2021   Hyponatremia 12/04/2021   Cancer with unknown primary site [facility]) 10/11/2021   Goals of care, counseling/discussion 10/11/2021   Chest pain  09/21/2021   Recent cerebrovascular accident (CVA) 09/21/2021   Interatrial cardiac shunt 51/76/1607   Acute embolic stroke ([place]) 37/08/6268   Mixed diabetic hyperlipidemia associated with type 2 diabetes mellitus ([place]) 09/16/2021   Liver masses 09/16/2021   Essential hypertension 03/12/2012   Type 2 diabetes mellitus with hyperglycemia ([place]) 06/15/2008   BPH (benign prostatic hyperplasia) 06/15/2008    REFERRING DIAG: I63.9 (ICD-10-CM) - CVA (cerebral vascular accident) ([place])   THERAPY DIAG:  Muscle weakness (generalized)  Unsteadiness on feet  Abnormal posture  Cerebrovascular accident (CVA) due to bilateral embolism of posterior cerebral arteries ([place])  Rationale for Evaluation and Treatment Rehabilitation  PERTINENT HISTORY: HTN HLD Metastatic adenocarcinoma to liver of unknown primary  Stroke, 1st Nov 4th 2022  PRECAUTIONS: fall risk  SUBJECTIVE: Feeling ok today. Feel tired most of the time  PAIN:  Are you having pain? No   OBJECTIVE:    TODAY'S TREATMENT: 04/28/22 Activity Comments  LAQ 5# 3x10 Cues and re-direction in sequence for reps/sets  Hip add isometric 3x10   Standing hip flexion 3x10, countertop support 5# ankle weights    Sidestepping along parallel bar 2x2 min, 5# ankle Cues for step width vs height  Static standing on airex pad, 2x30 sec intervals  EO; EC; feet together EO/EC; head turns EO/EC  Semi-tandem 3x15 sec firm surface  Retro-walk 2x 2 min, no AD CGA with frequent unsteadiness, delayed righting reactions            COGNITION: Overall cognitive status: Impaired: Attention: Impaired: Focused, Sustained, Divided, Memory: Impaired: Immediate Procedural, and Awareness: Impaired: Anticipatory             SENSATION: Light touch: Impaired  Proprioception: Impaired    COORDINATION: Difficulty with rapid alternating movements       MUSCLE TONE: no spasticity present, possibly some hypotonia LLE         POSTURE: No Significant  postural limitations   LOWER EXTREMITY ROM:    No limitations noted   LOWER EXTREMITY MMT:     MMT Right Eval Left Eval  Hip flexion 5 3+  Hip extension      Hip abduction 5 3+  Hip adduction 5 4  Hip internal rotation      Hip external rotation      Knee flexion      Knee extension 5 3+  Ankle dorsiflexion 5 4  Ankle plantarflexion      Ankle inversion      Ankle eversion      (Blank rows = not tested)   BED MOBILITY:  Independent   TRANSFERS: Assistive device utilized: Environmental consultant - 2 wheeled  Sit to stand: Modified independence Stand to sit: Modified independence Chair to chair: Modified independence Floor:  DNT     STAIRS:           Level of Assistance: SBA           Stair Negotiation Technique: Step to Pattern with Single Rail on Right           Number of Stairs: 4             Height of Stairs: 4-6  Comments: decreased speed   GAIT: Gait pattern: step through pattern and poor foot clearance- Left Distance walked: 150 Assistive device utilized: Walker - 2 wheeled Level of assistance: SBA Comments:    FUNCTIONAL TESTs:      [facility] PT Assessment - 04/13/22 0001                Balance    Balance Assessed Yes          Static Standing Balance    Static Standing - Comment/# of Minutes --          Standardized Balance Assessment    Standardized Balance Assessment Berg Balance Test;Timed Up and Go Test;Five Times Sit to Stand     Five times sit to stand comments  19.37          Berg Balance Test    Sit to Stand Able to stand  independently using hands     Standing Unsupported Able to stand safely 2 minutes     Sitting with Back Unsupported but Feet Supported on Floor or Stool Able to sit safely and securely 2 minutes     Stand to Sit Sits safely with minimal use of hands     Transfers Able to transfer safely, minor use of hands     Standing Unsupported with Eyes Closed Able to stand 10 seconds with supervision     Standing Unsupported with Feet Together  Able to place feet together independently and stand for 1 minute with supervision     From Standing, Reach Forward with Outstretched Arm Can reach forward >12 cm safely (5")     From Standing Position, Pick up Object from Floor  Able to pick up shoe, needs supervision     From Standing Position, Turn to Look Behind Over each Shoulder Turn sideways only but maintains balance     Turn 360 Degrees Needs close supervision or verbal cueing     Standing Unsupported, Alternately Place Feet on Step/Stool Able to complete >2 steps/needs minimal assist     Standing Unsupported, One Foot in [employer] balance while stepping or standing     Standing on One Leg Tries to lift leg/unable to hold 3 seconds but remains standing independently     Total Score 36          Timed Up and Go Test    Normal TUG (seconds) 26.1                    M-CTSIB  Condition 1: Firm Surface, EO 30 Sec, Mild Sway  Condition 2: Firm Surface, EC 30 Sec, Moderate Sway  Condition 3: Foam Surface, EO 20 Sec, Severe Sway  Condition 4: Foam Surface, EC 5 Sec,  LOB  Sway    PATIENT SURVEYS:      TODAY'S TREATMENT:  PT evaluation/assessment Tandem stance 3x30 sec at counter Marching 3x10 at counter     PATIENT EDUCATION: Education details: fall risk assessments and HEP initiation Person educated: Patient and Spouse Education method: Explanation and Demonstration Education comprehension: verbalized understanding     HOME EXERCISE PROGRAM: Tandem stance 3x30 sec at counter March at counter 3x10  Access Code: [email or web address] URL: [email or web address] Date: 04/17/2022 Prepared by: Sherlyn Lees  Exercises - Side Stepping with Counter Support  - 1 x daily - 7 x weekly - 2 sets - 2 min hold - Standing Hip Abduction with Counter Support  - 1 x daily - 7 x weekly - 3 sets - 10 reps - Seated Long Arc Quad  - 1 x daily - 7 x weekly - 3 sets - 10 reps - 2 sec hold     GOALS: Goals reviewed with patient? Yes    SHORT TERM GOALS: Target date: 05/11/2022   Patient will be independent in HEP to improve functional outcomes Baseline: Goal status: INITIAL   2.  Patient will demonstrate score 45/56 Berg Balance Test to manifest low risk for falls Baseline: 36/56 Goal status: INITIAL       LONG TERM GOALS: Target date: 06/08/2022   Demonstrate improved BLE strength and balance as evidenced by time of 15 sec for 5xSTS test Baseline: 19+ sec Goal status: INITIAL   2.  Patient will achieve 12 seconds for TUG test to manifest reduced risk for falls Baseline: 23 w/ RW Goal status: INITIAL   3.  Demonstrate improved proprioception and static balance as evidenced by ability to maintain minimal sway condition 4 M-CTSIB Baseline: complete LOB Goal status: INITIAL       ASSESSMENT:   CLINICAL IMPRESSION: Progressing with strength activities again requiring frequent cues for sequencing and attention to task.  Difficulty with attention/coordination in balance tasks requiring frequent redirection to set-up and task.  Unsteadiness when perfomring balance activities on compliant surfaces and eyes closed with tendency for retro LOB.  Small amplitude movements with retro-walking exhibiting decreased step length but less unsteadiness with this strategy vs previous session with frequent LOB. Continued sessions indicated to progress strength and balance activities to enhance coordination and reduce risk for falls     OBJECTIVE IMPAIRMENTS Abnormal gait, decreased activity tolerance, decreased balance, decreased cognition, decreased coordination, decreased knowledge of use of DME,  decreased mobility, difficulty walking, decreased strength, decreased safety awareness, and impaired tone.    ACTIVITY LIMITATIONS carrying, lifting, bending, standing, squatting, stairs, transfers, reach over head, and locomotion level   PARTICIPATION LIMITATIONS: meal prep, cleaning, laundry, interpersonal relationship, driving,  shopping, and yard work   PERSONAL FACTORS Age, Time since onset of injury/illness/exacerbation, and 1-2 comorbidities: CA and hx of CVA  are also affecting patient's functional outcome.    REHAB POTENTIAL: Good   CLINICAL DECISION MAKING: Evolving/moderate complexity   EVALUATION COMPLEXITY: Moderate   PLAN: PT FREQUENCY: 1-2x/week   PT DURATION: 8 weeks   PLANNED INTERVENTIONS: Therapeutic exercises, Therapeutic activity, Neuromuscular re-education, Balance training, Gait training, Patient/Family education, Joint mobilization, Vestibular training, Canalith repositioning, DME instructions, Wheelchair mobility training, Splintting, Taping, and Manual therapy   PLAN FOR NEXT SESSION:  HEP review, stride stance sit-stand (alternating foot placement ea set)  9:32 AM, 04/28/22 M. Sherlyn Lees, PT, DPT Physical Therapist- [place] Office Number: [phone]

## 2022-04-28 NOTE — Patient Instructions (Signed)
  Coordination Activities  Perform the following activities for 15 minutes 1-2 times per day with left hand(s).  Flip cards 1 at a time as fast as you can. Rotate card in hand (clockwise and counter-clockwise). Pick up coins, buttons, marbles, dried beans/pasta of different sizes and place in container. Pick up coins and stack. Then unstack coins. Pick up coins one at a time until you get 5-10 in your hand, then move coins from palm to fingertips to place in coin slot.

## 2022-05-01 ENCOUNTER — Ambulatory Visit: Payer: Medicare HMO | Admitting: Occupational Therapy

## 2022-05-01 ENCOUNTER — Encounter: Payer: Self-pay | Admitting: Occupational Therapy

## 2022-05-01 ENCOUNTER — Ambulatory Visit: Payer: Medicare HMO

## 2022-05-01 ENCOUNTER — Telehealth: Payer: Self-pay | Admitting: *Deleted

## 2022-05-01 VITALS — BP 124/87 | HR 89

## 2022-05-01 DIAGNOSIS — M6281 Muscle weakness (generalized): Secondary | ICD-10-CM

## 2022-05-01 DIAGNOSIS — E1165 Type 2 diabetes mellitus with hyperglycemia: Secondary | ICD-10-CM | POA: Diagnosis not present

## 2022-05-01 DIAGNOSIS — R2689 Other abnormalities of gait and mobility: Secondary | ICD-10-CM | POA: Diagnosis not present

## 2022-05-01 DIAGNOSIS — R293 Abnormal posture: Secondary | ICD-10-CM

## 2022-05-01 DIAGNOSIS — R2681 Unsteadiness on feet: Secondary | ICD-10-CM

## 2022-05-01 DIAGNOSIS — R41840 Attention and concentration deficit: Secondary | ICD-10-CM

## 2022-05-01 DIAGNOSIS — R278 Other lack of coordination: Secondary | ICD-10-CM

## 2022-05-01 DIAGNOSIS — I69354 Hemiplegia and hemiparesis following cerebral infarction affecting left non-dominant side: Secondary | ICD-10-CM

## 2022-05-01 DIAGNOSIS — I63433 Cerebral infarction due to embolism of bilateral posterior cerebral arteries: Secondary | ICD-10-CM | POA: Diagnosis not present

## 2022-05-01 DIAGNOSIS — R41841 Cognitive communication deficit: Secondary | ICD-10-CM | POA: Diagnosis not present

## 2022-05-01 NOTE — Telephone Encounter (Signed)
Call from [place] w/[facility] that she reached out to wife yesterday [email or web address] to Hospice. Was determined he is still receiving PT/OT/speech therapy, so he is not eligible for Hospice at this time. Wife was very upset with this news and was not receptive to Palliative Team. Requests our office call wife to offer Palliative Care. This RN called wife and left VM confirming that he does not qualify for [facility] when he is still in active rehab. He would qualify for Palliative Team that can still assist with goals of care, comfort measures and will make transition to hospice more smooth when he is ready for this. Requested she return call to discuss.

## 2022-05-01 NOTE — Therapy (Signed)
OUTPATIENT PHYSICAL THERAPY TREATMENT NOTE   Patient Name: Robert Warner MRN: [id number] DOB:August 26, 1941, 81 y.o., male Today's Date: 05/01/2022  PCP: none listed REFERRING PROVIDER: [name], MD  END OF SESSION:   PT End of Session - 05/01/22 1020     Visit Number 6    Number of Visits 16    Date for PT Re-Evaluation 06/08/22    Authorization Type [employer] Medicare 2023    Authorization Time Period auth required via Cohere    PT Start Time 1015    PT Stop Time 1100    PT Time Calculation (min) 45 min    Equipment Utilized During Treatment Gait belt    Activity Tolerance Patient tolerated treatment well    Behavior During Therapy WFL for tasks assessed/performed             Past Medical History:  Diagnosis Date   Arthritis    Cancer ([place])    Cataract    Diabetes mellitus    Hyperlipidemia    Stroke [facility])    Past Surgical History:  Procedure Laterality Date   APPENDECTOMY  1968   CATARACT EXTRACTION Bilateral 07/2000   states 2 weeks ago (first of sept) OD due in Dec    COLONOSCOPY  2004,2009   Negative, Dr. Sharlett Iles    IR IMAGING GUIDED PORT INSERTION  10/14/2021   KIDNEY STONE SURGERY  10/2018   PROSTATE BIOPSY  2006   Dr.Sigmund Tannebaum   Patient Active Problem List   Diagnosis Date Noted   Hemiparesis affecting nondominant side as late effect of stroke ([place]) 04/18/2022   Gait abnormality 03/28/2022   Left arm weakness 01/23/2022   UTI (urinary tract infection) 01/23/2022   Cerebrovascular accident (CVA) due to bilateral embolism of posterior cerebral arteries ([place]) 12/30/2021   Right foot drop 12/30/2021   Bacteremia due to Klebsiella pneumoniae 12/07/2021   DVT in Left peroneal vein 12/07/2021   CVA (cerebral vascular accident) ([place]) 12/06/2021   AKI (acute kidney injury) ([place]) 12/04/2021   Hyponatremia 12/04/2021   Cancer with unknown primary site [facility]) 10/11/2021   Goals of care, counseling/discussion 10/11/2021   Chest pain  09/21/2021   Recent cerebrovascular accident (CVA) 09/21/2021   Interatrial cardiac shunt 00/76/2263   Acute embolic stroke ([place]) 33/54/5625   Mixed diabetic hyperlipidemia associated with type 2 diabetes mellitus ([place]) 09/16/2021   Liver masses 09/16/2021   Essential hypertension 03/12/2012   Type 2 diabetes mellitus with hyperglycemia ([place]) 06/15/2008   BPH (benign prostatic hyperplasia) 06/15/2008    REFERRING DIAG: I63.9 (ICD-10-CM) - CVA (cerebral vascular accident) ([place])   THERAPY DIAG:  Muscle weakness (generalized)  Unsteadiness on feet  Abnormal posture  Cerebrovascular accident (CVA) due to bilateral embolism of posterior cerebral arteries ([place])  Rationale for Evaluation and Treatment Rehabilitation  PERTINENT HISTORY: HTN HLD Metastatic adenocarcinoma to liver of unknown primary  Stroke, 1st Nov 4th 2022  PRECAUTIONS: fall risk  SUBJECTIVE: Not feeling well today, stomach is hurting quite a bit and feeling very tired  PAIN:  Are you having pain? No   OBJECTIVE:    TODAY'S TREATMENT: 05/01/22 Activity Comments  LAQ 5# 3x10 Cues and re-direction in sequence for reps/sets. Tactile cues LLE  Hip add isometric 3x10   Standing hip flexion 3x10, countertop support 5# ankle weights    Sidestepping along countertop x2 min, 5# ankle Decreased cues needed for sequence   Retrowalking x 2 min Heavy cues for attention/direction  Gait training outside, curb negotiation W/ RW  and CGA               COGNITION: Overall cognitive status: Impaired: Attention: Impaired: Focused, Sustained, Divided, Memory: Impaired: Immediate Procedural, and Awareness: Impaired: Anticipatory             SENSATION: Light touch: Impaired  Proprioception: Impaired    COORDINATION: Difficulty with rapid alternating movements       MUSCLE TONE: no spasticity present, possibly some hypotonia LLE         POSTURE: No Significant postural limitations   LOWER EXTREMITY ROM:    No  limitations noted   LOWER EXTREMITY MMT:     MMT Right Eval Left Eval  Hip flexion 5 3+  Hip extension      Hip abduction 5 3+  Hip adduction 5 4  Hip internal rotation      Hip external rotation      Knee flexion      Knee extension 5 3+  Ankle dorsiflexion 5 4  Ankle plantarflexion      Ankle inversion      Ankle eversion      (Blank rows = not tested)   BED MOBILITY:  Independent   TRANSFERS: Assistive device utilized: Environmental consultant - 2 wheeled  Sit to stand: Modified independence Stand to sit: Modified independence Chair to chair: Modified independence Floor:  DNT     STAIRS:           Level of Assistance: SBA           Stair Negotiation Technique: Step to Pattern with Single Rail on Right           Number of Stairs: 4             Height of Stairs: 4-6  Comments: decreased speed   GAIT: Gait pattern: step through pattern and poor foot clearance- Left Distance walked: 150 Assistive device utilized: Walker - 2 wheeled Level of assistance: SBA Comments:    FUNCTIONAL TESTs:      [facility] PT Assessment - 04/13/22 0001                Balance    Balance Assessed Yes          Static Standing Balance    Static Standing - Comment/# of Minutes --          Standardized Balance Assessment    Standardized Balance Assessment Berg Balance Test;Timed Up and Go Test;Five Times Sit to Stand     Five times sit to stand comments  19.37          Berg Balance Test    Sit to Stand Able to stand  independently using hands     Standing Unsupported Able to stand safely 2 minutes     Sitting with Back Unsupported but Feet Supported on Floor or Stool Able to sit safely and securely 2 minutes     Stand to Sit Sits safely with minimal use of hands     Transfers Able to transfer safely, minor use of hands     Standing Unsupported with Eyes Closed Able to stand 10 seconds with supervision     Standing Unsupported with Feet Together Able to place feet together independently and stand for  1 minute with supervision     From Standing, Reach Forward with Outstretched Arm Can reach forward >12 cm safely (5")     From Standing Position, Pick up Object from Floor Able to pick up shoe, needs supervision  From Standing Position, Turn to Look Behind Over each Shoulder Turn sideways only but maintains balance     Turn 360 Degrees Needs close supervision or verbal cueing     Standing Unsupported, Alternately Place Feet on Step/Stool Able to complete >2 steps/needs minimal assist     Standing Unsupported, One Foot in [employer] balance while stepping or standing     Standing on One Leg Tries to lift leg/unable to hold 3 seconds but remains standing independently     Total Score 36          Timed Up and Go Test    Normal TUG (seconds) 26.1                    M-CTSIB  Condition 1: Firm Surface, EO 30 Sec, Mild Sway  Condition 2: Firm Surface, EC 30 Sec, Moderate Sway  Condition 3: Foam Surface, EO 20 Sec, Severe Sway  Condition 4: Foam Surface, EC 5 Sec,  LOB  Sway    PATIENT SURVEYS:      TODAY'S TREATMENT:  PT evaluation/assessment Tandem stance 3x30 sec at counter Marching 3x10 at counter     PATIENT EDUCATION: Education details: fall risk assessments and HEP initiation Person educated: Patient and Spouse Education method: Explanation and Demonstration Education comprehension: verbalized understanding     HOME EXERCISE PROGRAM: Tandem stance 3x30 sec at counter March at counter 3x10  Access Code: [email or web address] URL: [email or web address] Date: 04/17/2022 Prepared by: Sherlyn Lees  Exercises - Side Stepping with Counter Support  - 1 x daily - 7 x weekly - 2 sets - 2 min hold - Standing Hip Abduction with Counter Support  - 1 x daily - 7 x weekly - 3 sets - 10 reps - Seated Long Arc Quad  - 1 x daily - 7 x weekly - 3 sets - 10 reps - 2 sec hold     GOALS: Goals reviewed with patient? Yes   SHORT TERM GOALS: Target date: 05/11/2022   Patient  will be independent in HEP to improve functional outcomes Baseline: Goal status: INITIAL   2.  Patient will demonstrate score 45/56 Berg Balance Test to manifest low risk for falls Baseline: 36/56 Goal status: INITIAL       LONG TERM GOALS: Target date: 06/08/2022   Demonstrate improved BLE strength and balance as evidenced by time of 15 sec for 5xSTS test Baseline: 19+ sec Goal status: INITIAL   2.  Patient will achieve 12 seconds for TUG test to manifest reduced risk for falls Baseline: 23 w/ RW Goal status: INITIAL   3.  Demonstrate improved proprioception and static balance as evidenced by ability to maintain minimal sway condition 4 M-CTSIB Baseline: complete LOB Goal status: INITIAL       ASSESSMENT:   CLINICAL IMPRESSION: BP measured after initial bout of strengthening activities and reveals 102/80 mmHg and 95 bpm. Demonstrates overall decline in cognitive function/attention today noting increased lethargy and stomach pain.  Spouse notes low blood sugar reading this AM but after OJ, protein shake, and breakfast it was improved to 130. Decreased activity tolerance today and decrease in attention to task and initiation. Outdoor ambulation with CGA and emphasis on safety and set-up for curb negotiation.  Activity limited to tolerance throughout and frequent monitoring. Continued sessions per pt tolerance     OBJECTIVE IMPAIRMENTS Abnormal gait, decreased activity tolerance, decreased balance, decreased cognition, decreased coordination, decreased knowledge of use of DME, decreased mobility, difficulty walking, decreased strength, decreased  safety awareness, and impaired tone.    ACTIVITY LIMITATIONS carrying, lifting, bending, standing, squatting, stairs, transfers, reach over head, and locomotion level   PARTICIPATION LIMITATIONS: meal prep, cleaning, laundry, interpersonal relationship, driving, shopping, and yard work   PERSONAL FACTORS Age, Time since onset of  injury/illness/exacerbation, and 1-2 comorbidities: CA and hx of CVA  are also affecting patient's functional outcome.    REHAB POTENTIAL: Good   CLINICAL DECISION MAKING: Evolving/moderate complexity   EVALUATION COMPLEXITY: Moderate   PLAN: PT FREQUENCY: 1-2x/week   PT DURATION: 8 weeks   PLANNED INTERVENTIONS: Therapeutic exercises, Therapeutic activity, Neuromuscular re-education, Balance training, Gait training, Patient/Family education, Joint mobilization, Vestibular training, Canalith repositioning, DME instructions, Wheelchair mobility training, Splintting, Taping, and Manual therapy   PLAN FOR NEXT SESSION:  HEP review, stride stance sit-stand (alternating foot placement ea set)  10:23 AM, 05/01/22 M. Sherlyn Lees, PT, DPT Physical Therapist- [place] Office Number: [phone]

## 2022-05-01 NOTE — Telephone Encounter (Signed)
Spoke w/Mrs. [place] and explained situation and what Palliative could do to help them. Suggested she speak w/rehab on Wednesday to see if he is making progress and if they anticipate much more improvement and explain that Hospice referral is pending. Informed her MD offered this since he will no longer receive treatment for his cancer. Death does not appear imminent and has time to make a good decision. She reports he is not very motivated in rehab, but he has been having a lot of pain. Has not yet started the hydromorphone or MS Contin. She will call back on 6/21 with their decision.

## 2022-05-01 NOTE — Therapy (Signed)
OUTPATIENT OCCUPATIONAL THERAPY NEURO EVALUATION  Patient Name: Robert Warner MRN: [id number] DOB:02/02/1941, 81 y.o., male Today's Date: 05/01/2022  PCP: [name], MD REFERRING PROVIDER: [name], MD   OT End of Session - 05/01/22 1155     Visit Number 4    Number of Visits 17    Date for OT Re-Evaluation 06/16/22    Authorization Type [employer]    OT Start Time 0935    OT Stop Time 1016    OT Time Calculation (min) 41 min    Activity Tolerance Patient limited by pain    Behavior During Therapy Impulsive               Past Medical History:  Diagnosis Date   Arthritis    Cancer ([place])    Cataract    Diabetes mellitus    Hyperlipidemia    Stroke [facility])    Past Surgical History:  Procedure Laterality Date   APPENDECTOMY  1968   CATARACT EXTRACTION Bilateral 07/2000   states 2 weeks ago (first of sept) OD due in Dec    COLONOSCOPY  2004,2009   Negative, Dr. Sharlett Iles    IR IMAGING GUIDED PORT INSERTION  10/14/2021   KIDNEY STONE SURGERY  10/2018   PROSTATE BIOPSY  2006   Dr.Sigmund Tannebaum   Patient Active Problem List   Diagnosis Date Noted   Hemiparesis affecting nondominant side as late effect of stroke ([place]) 04/18/2022   Gait abnormality 03/28/2022   Left arm weakness 01/23/2022   UTI (urinary tract infection) 01/23/2022   Cerebrovascular accident (CVA) due to bilateral embolism of posterior cerebral arteries ([place]) 12/30/2021   Right foot drop 12/30/2021   Bacteremia due to Klebsiella pneumoniae 12/07/2021   DVT in Left peroneal vein 12/07/2021   CVA (cerebral vascular accident) ([place]) 12/06/2021   AKI (acute kidney injury) ([place]) 12/04/2021   Hyponatremia 12/04/2021   Cancer with unknown primary site [facility]) 10/11/2021   Goals of care, counseling/discussion 10/11/2021   Chest pain 09/21/2021   Recent cerebrovascular accident (CVA) 09/21/2021   Interatrial cardiac shunt 51/76/1607   Acute embolic stroke ([place]) 37/08/6268    Mixed diabetic hyperlipidemia associated with type 2 diabetes mellitus ([place]) 09/16/2021   Liver masses 09/16/2021   Essential hypertension 03/12/2012   Type 2 diabetes mellitus with hyperglycemia ([place]) 06/15/2008   BPH (benign prostatic hyperplasia) 06/15/2008    ONSET DATE: 01/23/2022  REFERRING DIAG: I63.9 (ICD-10-CM) - CVA (cerebral vascular accident)   THERAPY DIAG:  Muscle weakness (generalized)  Unsteadiness on feet  Abnormal posture  Other lack of coordination  Attention and concentration deficit  Hemiplegia and hemiparesis following cerebral infarction affecting left non-dominant side (HCC)  Rationale for Evaluation and Treatment Rehabilitation  SUBJECTIVE:   SUBJECTIVE STATEMENT: Pt reports his stomach hurts  Pt accompanied by: self - family member did not come back to therapy   PAIN:  Are you having pain? Yes - 5/10 in stomach  PATIENT GOALS to be more independent  OBJECTIVE:   Today's Treatment: 05/01/22 Patient ambulated back to clinic from lobby with RW and min assist.  Patient with right knee buckling when he started walking, and little attempt to correct.  Worked at tabletop in minimally distracting environment - 2 other treatments occurring in gym.  Skilled OT intervention to address sustained to selective attention.  Patient asked to sort cards from low to high.  Patient moved cards on top of the table without ever flipping them over to indicate numeric value.  When asked what patient was doing - he could state "low to high" - but his actions did not change.  Patient required overt verbal cueing and demonstration to flip cards over.  Patient did not spontaneously use left hand, but able to use with frequent cueing.  Reduced number of cards to a field of four - sorting from low to high cards 6-9.  Patient needed consistent cueing to visually locate cards to left of midline.  Patient could consistently ID value of card in his hand but needed direct and constant  cueing to sort - place on top of like cards on table.  Patient distracted - internally - "not feeling well - stomach hurts" as well as externally - with movement or conversations happening around him.  Patient may have better attention with familiar functional versus novel task.    PATIENT EDUCATION: Education details: Educated on attention and initiation during structured tasks and functional use of LUE at diminished level. Person educated: Patient Education method: Explanation Education comprehension: verbalized understanding   HOME EXERCISE PROGRAM: Coordination HEP - see pt instructions    GOALS: Goals reviewed with patient? Yes  SHORT TERM GOALS: Target date: 05/19/2022  STG   Status:  1 Pt will be independent with coordination HEP to increase independence with ADLs and IADLs. Baseline:  IN PROGRESS  2 Pt will complete table top scanning activity with min cues for technique and recognition of errors. Baseline:  IN PROGRESS  3 Pt will demonstrate ability to sequence simple functional task (simple snack prep, laundry task, etc) with supervision Baseline:  IN PROGRESS  4 Pt will demonstrate improved fine motor coordination for ADLs as evidenced by decreasing 9 hole peg test score for LUE by 10 secs Baseline: Right: 39.94 sec; Left: 1:02.85 sec IN PROGRESS   LONG TERM GOALS: Target date: 06/16/2022  LTG   Status:  1 Pt will navigate a moderately busy environment, following multi-step commands with 90% accuracy Baseline:  IN PROGRESS  2 Pt will be able to complete UB dressing with supervision and <2 cues demonstrating improved awareness of errors and sequencing of tasks. Baseline:  IN PROGRESS  3 Pt will demonstrate improved engagement and motivation to engage in leisure tasks by report of completing 2 familiar tasks with < min encouragement. Baseline:  IN PROGRESS  4 Pt will demonstrate improved fine motor coordination for ADLs as evidenced by decreasing 9 hole peg test score for  LUE by 15 secs Baseline: Right: 39.94 sec; Left: 1:02.85 sec IN PROGRESS  5 Pt will demonstrate improved UE functional use for ADLs as evidenced by increasing box/ blocks score by 5 blocks with BUE Baseline: Right 25 blocks, Left 19 blocks  IN PROGRESS    ASSESSMENT:  CLINICAL IMPRESSION: Pt with severely impaired attention to task this session.  Patient distracted by physical issues and pain, but also has difficulty to attend to one step command in min distracting environment. This impeded safe performance of any self care task. Needs additional focus in this area.  Perhaps seek medical assistance to help address attention and focus.    PERFORMANCE DEFICITS in functional skills including ADLs, IADLs, coordination, proprioception, sensation, ROM, strength, FMC, GMC, balance, body mechanics, endurance, decreased knowledge of precautions, and UE functional use, cognitive skills including attention, memory, problem solving, and sequencing.  IMPAIRMENTS are limiting patient from ADLs and IADLs.   COMORBIDITIES may have co-morbidities  that affects occupational performance. Patient will benefit from skilled OT to address above impairments and improve overall  function.  MODIFICATION OR ASSISTANCE TO COMPLETE EVALUATION: Min-Moderate modification of tasks or assist with assess necessary to complete an evaluation.  OT OCCUPATIONAL PROFILE AND HISTORY: Detailed assessment: Review of records and additional review of physical, cognitive, psychosocial history related to current functional performance.  CLINICAL DECISION MAKING: Moderate - several treatment options, min-mod task modification necessary  REHAB POTENTIAL: Good  EVALUATION COMPLEXITY: Moderate    PLAN: OT FREQUENCY: 2x/week  OT DURATION: 8 weeks  PLANNED INTERVENTIONS: self care/ADL training, therapeutic exercise, therapeutic activity, neuromuscular re-education, manual therapy, balance training, functional mobility training,  electrical stimulation, ultrasound, moist heat, cryotherapy, patient/family education, cognitive remediation/compensation, visual/perceptual remediation/compensation, psychosocial skills training, energy conservation, and DME and/or AE instructions  RECOMMENDED OTHER SERVICES: N/A  CONSULTED AND AGREED WITH PLAN OF CARE: Patient and family member/caregiver  PLAN FOR NEXT SESSION: engage in functional familiar table top tasks with focus on attention and increasing to dual task as able, LUE shoulder strengthening with functional reach   Mariah Milling, OTR/L 05/01/2022, 11:59 AM

## 2022-05-03 ENCOUNTER — Ambulatory Visit: Payer: Medicare HMO | Admitting: Speech Pathology

## 2022-05-03 ENCOUNTER — Ambulatory Visit: Payer: Medicare HMO

## 2022-05-03 ENCOUNTER — Telehealth: Payer: Self-pay | Admitting: *Deleted

## 2022-05-03 DIAGNOSIS — R2681 Unsteadiness on feet: Secondary | ICD-10-CM | POA: Diagnosis not present

## 2022-05-03 DIAGNOSIS — R41841 Cognitive communication deficit: Secondary | ICD-10-CM | POA: Diagnosis not present

## 2022-05-03 DIAGNOSIS — M6281 Muscle weakness (generalized): Secondary | ICD-10-CM

## 2022-05-03 DIAGNOSIS — I69354 Hemiplegia and hemiparesis following cerebral infarction affecting left non-dominant side: Secondary | ICD-10-CM

## 2022-05-03 DIAGNOSIS — R293 Abnormal posture: Secondary | ICD-10-CM | POA: Diagnosis not present

## 2022-05-03 DIAGNOSIS — R2689 Other abnormalities of gait and mobility: Secondary | ICD-10-CM | POA: Diagnosis not present

## 2022-05-03 DIAGNOSIS — R278 Other lack of coordination: Secondary | ICD-10-CM | POA: Diagnosis not present

## 2022-05-03 DIAGNOSIS — R41840 Attention and concentration deficit: Secondary | ICD-10-CM | POA: Diagnosis not present

## 2022-05-03 DIAGNOSIS — I63433 Cerebral infarction due to embolism of bilateral posterior cerebral arteries: Secondary | ICD-10-CM | POA: Diagnosis not present

## 2022-05-03 NOTE — Telephone Encounter (Signed)
Mrs. Eichelberger made nurse navigator aware they agree to pursue Palliative Team referral. Martin Majestic to rehab today and he is making progress, so he wishes to continue this. Called Palliative Team with referral. They will reach out to wife.

## 2022-05-03 NOTE — Therapy (Signed)
OUTPATIENT PHYSICAL THERAPY TREATMENT NOTE   Patient Name: Robert Warner MRN: [id number] DOB:May 12, 1941, 81 y.o., male Today's Date: 05/03/2022  PCP: none listed REFERRING PROVIDER: [name], MD  END OF SESSION:   PT End of Session - 05/03/22 1109     Visit Number 7    Number of Visits 16    Date for PT Re-Evaluation 06/08/22    Authorization Type [employer] 2023    Authorization Time Period auth required via [employer]    PT Start Time 1100    PT Stop Time 4580    PT Time Calculation (min) 45 min    Equipment Utilized During Treatment Gait belt    Activity Tolerance Patient tolerated treatment well    Behavior During Therapy WFL for tasks assessed/performed             Past Medical History:  Diagnosis Date   Arthritis    Cancer ([place])    Cataract    Diabetes mellitus    Hyperlipidemia    Stroke [facility])    Past Surgical History:  Procedure Laterality Date   APPENDECTOMY  1968   CATARACT EXTRACTION Bilateral 07/2000   states 2 weeks ago (first of sept) OD due in Dec    COLONOSCOPY  2004,2009   Negative, Dr. Sharlett Iles    IR IMAGING GUIDED PORT INSERTION  10/14/2021   KIDNEY STONE SURGERY  10/2018   PROSTATE BIOPSY  2006   Dr.Sigmund Tannebaum   Patient Active Problem List   Diagnosis Date Noted   Hemiparesis affecting nondominant side as late effect of stroke ([place]) 04/18/2022   Gait abnormality 03/28/2022   Left arm weakness 01/23/2022   UTI (urinary tract infection) 01/23/2022   Cerebrovascular accident (CVA) due to bilateral embolism of posterior cerebral arteries ([place]) 12/30/2021   Right foot drop 12/30/2021   Bacteremia due to Klebsiella pneumoniae 12/07/2021   DVT in Left peroneal vein 12/07/2021   CVA (cerebral vascular accident) ([place]) 12/06/2021   AKI (acute kidney injury) ([place]) 12/04/2021   Hyponatremia 12/04/2021   Cancer with unknown primary site [facility]) 10/11/2021   Goals of care, counseling/discussion 10/11/2021   Chest pain  09/21/2021   Recent cerebrovascular accident (CVA) 09/21/2021   Interatrial cardiac shunt 99/83/3825   Acute embolic stroke ([place]) 05/39/7673   Mixed diabetic hyperlipidemia associated with type 2 diabetes mellitus ([place]) 09/16/2021   Liver masses 09/16/2021   Essential hypertension 03/12/2012   Type 2 diabetes mellitus with hyperglycemia ([place]) 06/15/2008   BPH (benign prostatic hyperplasia) 06/15/2008    REFERRING DIAG: I63.9 (ICD-10-CM) - CVA (cerebral vascular accident) ([place])   THERAPY DIAG:  Muscle weakness (generalized)  Unsteadiness on feet  Abnormal posture  Hemiplegia and hemiparesis following cerebral infarction affecting left non-dominant side (HCC)  Rationale for Evaluation and Treatment Rehabilitation  PERTINENT HISTORY: HTN HLD Metastatic adenocarcinoma to liver of unknown primary  Stroke, 1st Nov 4th 2022  PRECAUTIONS: fall risk  SUBJECTIVE: Feel the same as the other day, stomach hurts and feeling tired.   PAIN:  Are you having pain? No   OBJECTIVE:    TODAY'S TREATMENT: 05/03/22 Activity Comments  LAQ 5# 3x10 Cues and re-direction in sequence for reps/sets. Tactile cues LLE  Hip add isometric 3x10   Walking march 5# along countertop Visual/verbal cues to continue left step height  Sidestepping along countertop x2 min, 5# ankle Decreased cues needed for sequence   Retrowalking 2x 2 min Instances of retro-LOB with tactile cues to correct, present/delayed righting reactions  NU-step  level 4 x 8 min  Consistent cues for LUE engagement/grip to improve alternating movements               COGNITION: Overall cognitive status: Impaired: Attention: Impaired: Focused, Sustained, Divided, Memory: Impaired: Immediate Procedural, and Awareness: Impaired: Anticipatory             SENSATION: Light touch: Impaired  Proprioception: Impaired    COORDINATION: Difficulty with rapid alternating movements       MUSCLE TONE: no spasticity present, possibly some  hypotonia LLE         POSTURE: No Significant postural limitations   LOWER EXTREMITY ROM:    No limitations noted   LOWER EXTREMITY MMT:     MMT Right Eval Left Eval  Hip flexion 5 3+  Hip extension      Hip abduction 5 3+  Hip adduction 5 4  Hip internal rotation      Hip external rotation      Knee flexion      Knee extension 5 3+  Ankle dorsiflexion 5 4  Ankle plantarflexion      Ankle inversion      Ankle eversion      (Blank rows = not tested)   BED MOBILITY:  Independent   TRANSFERS: Assistive device utilized: Environmental consultant - 2 wheeled  Sit to stand: Modified independence Stand to sit: Modified independence Chair to chair: Modified independence Floor:  DNT     STAIRS:           Level of Assistance: SBA           Stair Negotiation Technique: Step to Pattern with Single Rail on Right           Number of Stairs: 4             Height of Stairs: 4-6  Comments: decreased speed   GAIT: Gait pattern: step through pattern and poor foot clearance- Left Distance walked: 150 Assistive device utilized: Walker - 2 wheeled Level of assistance: SBA Comments:    FUNCTIONAL TESTs:      [facility] PT Assessment - 04/13/22 0001                Balance    Balance Assessed Yes          Static Standing Balance    Static Standing - Comment/# of Minutes --          Standardized Balance Assessment    Standardized Balance Assessment Berg Balance Test;Timed Up and Go Test;Five Times Sit to Stand     Five times sit to stand comments  19.37          Berg Balance Test    Sit to Stand Able to stand  independently using hands     Standing Unsupported Able to stand safely 2 minutes     Sitting with Back Unsupported but Feet Supported on Floor or Stool Able to sit safely and securely 2 minutes     Stand to Sit Sits safely with minimal use of hands     Transfers Able to transfer safely, minor use of hands     Standing Unsupported with Eyes Closed Able to stand 10 seconds with  supervision     Standing Unsupported with Feet Together Able to place feet together independently and stand for 1 minute with supervision     From Standing, Reach Forward with Outstretched Arm Can reach forward >12 cm safely (5")     From Standing Position,  Pick up Object from [place] to pick up shoe, needs supervision     From Standing Position, Turn to Look Behind Over each Shoulder Turn sideways only but maintains balance     Turn 360 Degrees Needs close supervision or verbal cueing     Standing Unsupported, Alternately Place Feet on Step/Stool Able to complete >2 steps/needs minimal assist     Standing Unsupported, One Foot in [employer] balance while stepping or standing     Standing on One Leg Tries to lift leg/unable to hold 3 seconds but remains standing independently     Total Score 36          Timed Up and Go Test    Normal TUG (seconds) 26.1                    M-CTSIB  Condition 1: Firm Surface, EO 30 Sec, Mild Sway  Condition 2: Firm Surface, EC 30 Sec, Moderate Sway  Condition 3: Foam Surface, EO 20 Sec, Severe Sway  Condition 4: Foam Surface, EC 5 Sec,  LOB  Sway    PATIENT SURVEYS:      TODAY'S TREATMENT:  PT evaluation/assessment Tandem stance 3x30 sec at counter Marching 3x10 at counter     PATIENT EDUCATION: Education details: fall risk assessments and HEP initiation Person educated: Patient and Spouse Education method: Explanation and Demonstration Education comprehension: verbalized understanding     HOME EXERCISE PROGRAM: Tandem stance 3x30 sec at counter March at counter 3x10  Access Code: [email or web address] URL: [email or web address] Date: 04/17/2022 Prepared by: Sherlyn Lees  Exercises - Side Stepping with Counter Support  - 1 x daily - 7 x weekly - 2 sets - 2 min hold - Standing Hip Abduction with Counter Support  - 1 x daily - 7 x weekly - 3 sets - 10 reps - Seated Long Arc Quad  - 1 x daily - 7 x weekly - 3 sets - 10 reps - 2 sec  hold     GOALS: Goals reviewed with patient? Yes   SHORT TERM GOALS: Target date: 05/11/2022   Patient will be independent in HEP to improve functional outcomes Baseline: Goal status: INITIAL   2.  Patient will demonstrate score 45/56 Berg Balance Test to manifest low risk for falls Baseline: 36/56 Goal status: INITIAL       LONG TERM GOALS: Target date: 06/08/2022   Demonstrate improved BLE strength and balance as evidenced by time of 15 sec for 5xSTS test Baseline: 19+ sec Goal status: INITIAL   2.  Patient will achieve 12 seconds for TUG test to manifest reduced risk for falls Baseline: 23 w/ RW Goal status: INITIAL   3.  Demonstrate improved proprioception and static balance as evidenced by ability to maintain minimal sway condition 4 M-CTSIB Baseline: complete LOB Goal status: INITIAL       ASSESSMENT:   CLINICAL IMPRESSION: Left inattention requiring cues throughout session for awareness and engagement.  Retro-walking activity without UE support induces LOB with present, but delayed righting reactions needing tactile cues for correction.  Limited activity tolerance requiring rest periods Q 2-4 min due to fatigue and abdominal discomfort.  Demo good attention to use of RW when transferring or performing gait and spontaneously searches for and appropriately uses AD.  LLE weakness > RLE with cues for attention/engagment when performing various activities/exercise. Continued sessions to progress balance and strength to reduce risk for falls     OBJECTIVE IMPAIRMENTS Abnormal gait, decreased activity tolerance,  decreased balance, decreased cognition, decreased coordination, decreased knowledge of use of DME, decreased mobility, difficulty walking, decreased strength, decreased safety awareness, and impaired tone.    ACTIVITY LIMITATIONS carrying, lifting, bending, standing, squatting, stairs, transfers, reach over head, and locomotion level   PARTICIPATION LIMITATIONS: meal  prep, cleaning, laundry, interpersonal relationship, driving, shopping, and yard work   PERSONAL FACTORS Age, Time since onset of injury/illness/exacerbation, and 1-2 comorbidities: CA and hx of CVA  are also affecting patient's functional outcome.    REHAB POTENTIAL: Good   CLINICAL DECISION MAKING: Evolving/moderate complexity   EVALUATION COMPLEXITY: Moderate   PLAN: PT FREQUENCY: 1-2x/week   PT DURATION: 8 weeks   PLANNED INTERVENTIONS: Therapeutic exercises, Therapeutic activity, Neuromuscular re-education, Balance training, Gait training, Patient/Family education, Joint mobilization, Vestibular training, Canalith repositioning, DME instructions, Wheelchair mobility training, Splintting, Taping, and Manual therapy   PLAN FOR NEXT SESSION:  HEP review, stride stance sit-stand (alternating foot placement ea set)  11:09 AM, 05/03/22 M. Sherlyn Lees, PT, DPT Physical Therapist- [place] Office Number: [phone]

## 2022-05-03 NOTE — Therapy (Signed)
OUTPATIENT SPEECH LANGUAGE PATHOLOGY TREATMENT NOTE   Patient Name: Robert Warner MRN: [id number] DOB:07/10/1941, 81 y.o., male Today's Date: 05/03/2022  PCP: Robert Littler MD REFERRING PROVIDER: [name] MD  END OF SESSION:   End of Session - 05/03/22 1024     Visit Number 4    Number of Visits 25    Date for SLP Re-Evaluation 07/11/22    Authorization Type humana medicare    SLP Start Time 57    SLP Stop Time  1058    SLP Time Calculation (min) 38 min    Activity Tolerance Patient limited by fatigue             Past Medical History:  Diagnosis Date   Arthritis    Cancer ([place])    Cataract    Diabetes mellitus    Hyperlipidemia    Stroke [facility])    Past Surgical History:  Procedure Laterality Date   APPENDECTOMY  1968   CATARACT EXTRACTION Bilateral 07/2000   states 2 weeks ago (first of sept) OD due in Dec    COLONOSCOPY  2004,2009   Negative, Robert Warner    IR IMAGING GUIDED PORT INSERTION  10/14/2021   KIDNEY STONE SURGERY  10/2018   PROSTATE BIOPSY  2006   Robert Warner   Patient Active Problem List   Diagnosis Date Noted   Hemiparesis affecting nondominant side as late effect of stroke ([place]) 04/18/2022   Gait abnormality 03/28/2022   Left arm weakness 01/23/2022   UTI (urinary tract infection) 01/23/2022   Cerebrovascular accident (CVA) due to bilateral embolism of posterior cerebral arteries ([place]) 12/30/2021   Right foot drop 12/30/2021   Bacteremia due to Klebsiella pneumoniae 12/07/2021   DVT in Left peroneal vein 12/07/2021   CVA (cerebral vascular accident) ([place]) 12/06/2021   AKI (acute kidney injury) ([place]) 12/04/2021   Hyponatremia 12/04/2021   Cancer with unknown primary site ([place]) 10/11/2021   Goals of care, counseling/discussion 10/11/2021   Chest pain 09/21/2021   Recent cerebrovascular accident (CVA) 09/21/2021   Interatrial cardiac shunt 35/45/6256   Acute embolic stroke ([place]) 38/93/7342   Mixed diabetic  hyperlipidemia associated with type 2 diabetes mellitus ([place]) 09/16/2021   Liver masses 09/16/2021   Essential hypertension 03/12/2012   Type 2 diabetes mellitus with hyperglycemia ([place]) 06/15/2008   BPH (benign prostatic hyperplasia) 06/15/2008    ONSET DATE: Wife reports more cognitive decline after CVA January 2023  REFERRING DIAG: I63.9 (ICD-10-CM) - CVA (cerebral vascular accident) ([place]), Metastatic adenocarcinoma  THERAPY DIAG: Cognitive communication deficit  Rationale for Evaluation and Treatment Rehabilitation  SUBJECTIVE: "I don't know what I'm doing." (During tx task)  PAIN:  Are you having pain? Yes Stomach pain, 6/10  Aching Intermittent      OBJECTIVE:  TODAY'S TREATMENT:   05/03/22: ST services focused on cognition this session. SLP facilitated attention during structured cognitive task. Pt required modA to name 10 basic objects in categories (fruit - modA 5 min , veggies - modA 5 min, and colors - modA 3 min). Facilitated attention during structured conversation. 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 Pt was able to tell SLP where he went to college. 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 Facilitated orientation by discussed pt's upcoming birthday. Pt was unable to recall how old he was turning, verbalizing "38". After SLP told pt "72", but was unable to  recall 2 min later.    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 As opposed to previous session, pt was able to converse and appropriately ask/ answer questions with minA verbal cues to remain on topic. After 15 minutes of conversation, communication breakdown started to occur. Pt had difficulty answering basic biographical questions regarding him and his wife's first meeting. Pt tends to deflect confusion with humor; therefore, SLP gently  redirected pt to question. With redirection, pt was able to answer appropriately. SLP and pt discussed the possibility of him moving to ALF. SLP asked about what pt currently does around the house. He reported that he unloads and loads the dishwasher at home to help his wife at home; however, reports that she does 99% of the work.   SLP further facilitated attention by addressing functional task (sorting ingredients into appropriate categories on a grocery list). Pt required modA verbal and gestural cueing to complete task. He also required cueing for visual attention/scanning. When pt began to lose attention and SLP provided brain breaks to reset/refocus.   04/19/22: Pt participated in skilled ST services with focus on cognition this date. SLP completed evaluation of cognitive skills using CLQT. He required repetition of direction and consistently voiced, "I don't remember what I am supposed to be doing."  Scores are as follows re:   Attention  15/215  SEVERE  Memory  103/185 MODERATE  Executive Functions  6/ 40 SEVERE  Language  25/37 MILD  Visuospatial Skills  9/105 SEVERE  Clock Drawing 1/13 SEVERE       PATIENT EDUCATION: Education details: attention Person educated: Patient  Education method: Customer service manager Education comprehension: verbalized understanding, needs further education, and (wife verbalized understanding, and Robert Warner will require further education.     GOALS: Goals reviewed with patient? Yes   SHORT TERM GOALS: Target date: 05/11/2022     Pt will perform a functional task for 3 minutes without evidence of distraction, in 3 sessions Baseline: Goal status: INITIAL   2.   Pt will perform a functional task for 6 minutes with rare min A, in 3 sessions Baseline:  Goal status: INITIAL   3.  Wife/family will demo appropriate cueing back to task when pt exhibits off-task behavior, in 3 sessions Baseline:  Goal status: INITIAL   4.  Pt family will tell SLP 5  tasks to do at home that could improve pt's attention, in 2 sessions Baseline:  Goal status: NOT MET; wife has not come in for therapy.    5.  Pt family will explain modifications to pt environment to reduce distractions which could improve pt's attention, in 2 sessions Baseline:  Goal status: NOT MET; wife has not come in for therapy      LONG TERM GOALS: Target date: 07/11/22     Pt will perform a functional task for 8 minutes with rare min A, in 3 sessions Baseline:  Goal status: INITIAL   2.  Pt will tindicate knowledge of 3 deficit areas in response to SLP yes/no questions Baseline:  Goal status: INITIAL   3.  Pt will demo knowledge of a simple memory system Baseline:  Goal status: INITIAL   4.  Pt will turn to appropriate section in a 2-3 section memory system 80% of the time Baseline:  Goal status: INITIAL   5.  Pt will tell family it is time for his medication with the use of alarms, for 5/7  days  Baseline:  Goal status: INITIAL   ASSESSMENT:   CLINICAL IMPRESSION: Patient is a 81 y.o. male with diagnosis of cog-comm impairment. See tx note.  Pt showed significant deficits in all areas of cognitive linguistics stemming from dense deficits in attention and awareness. Pt will benefit from skilled ST to rehabilitate these deficits. Primary focus of therapy will be for pt's sustained and selective attention. SLP suspects pt is affected by internal and external distraction.   OBJECTIVE IMPAIRMENTS include attention, memory, awareness, executive functioning, and aphasia. These impairments are limiting patient from managing medications, managing appointments, managing finances, household responsibilities, ADLs/IADLs, and effectively communicating at home and in community. Factors affecting potential to achieve goals and functional outcome are ability to learn/carryover information, cooperation/participation level, previous level of function, and severity of impairments.. Patient  will benefit from skilled SLP services to address above impairments and improve overall function.   REHAB POTENTIAL: Fair due to pt's low level of attention and awareness/insight   PLAN: SLP FREQUENCY: 2x/week   SLP DURATION: 12 weeks   PLANNED INTERVENTIONS: Environmental controls, Cueing hierachy, Cognitive reorganization, Internal/external aids, Multimodal communication approach, SLP instruction and feedback, Compensatory strategies, and Patient/family education    [place], [place] 05/03/2022, 10:25 AM

## 2022-05-08 ENCOUNTER — Ambulatory Visit: Payer: Medicare HMO

## 2022-05-08 ENCOUNTER — Ambulatory Visit: Payer: Medicare HMO | Admitting: Occupational Therapy

## 2022-05-08 ENCOUNTER — Telehealth: Payer: Self-pay

## 2022-05-08 DIAGNOSIS — R41841 Cognitive communication deficit: Secondary | ICD-10-CM

## 2022-05-08 DIAGNOSIS — R293 Abnormal posture: Secondary | ICD-10-CM | POA: Diagnosis not present

## 2022-05-08 DIAGNOSIS — M6281 Muscle weakness (generalized): Secondary | ICD-10-CM | POA: Diagnosis not present

## 2022-05-08 DIAGNOSIS — R41840 Attention and concentration deficit: Secondary | ICD-10-CM | POA: Diagnosis not present

## 2022-05-08 DIAGNOSIS — I63433 Cerebral infarction due to embolism of bilateral posterior cerebral arteries: Secondary | ICD-10-CM | POA: Diagnosis not present

## 2022-05-08 DIAGNOSIS — I69354 Hemiplegia and hemiparesis following cerebral infarction affecting left non-dominant side: Secondary | ICD-10-CM

## 2022-05-08 DIAGNOSIS — R2689 Other abnormalities of gait and mobility: Secondary | ICD-10-CM | POA: Diagnosis not present

## 2022-05-08 DIAGNOSIS — R2681 Unsteadiness on feet: Secondary | ICD-10-CM

## 2022-05-08 DIAGNOSIS — R278 Other lack of coordination: Secondary | ICD-10-CM | POA: Diagnosis not present

## 2022-05-08 NOTE — Therapy (Addendum)
OUTPATIENT SPEECH LANGUAGE PATHOLOGY TREATMENT NOTE   Patient Name: Robert Warner MRN: [id number] DOB:Sep 03, 1941, 81 y.o., male Today's Date: 05/08/2022  PCP: Robert Littler MD REFERRING PROVIDER: [name] MD  END OF SESSION:   End of Session - 05/08/22 0903     Visit Number 5    Number of Visits 25    Date for SLP Re-Evaluation 07/11/22    Authorization Type humana medicare    SLP Start Time [id number]    SLP Stop Time  0931    SLP Time Calculation (min) 40 min    Activity Tolerance Patient limited by fatigue             Past Medical History:  Diagnosis Date   Arthritis    Cancer (Robert Warner)    Cataract    Diabetes mellitus    Hyperlipidemia    Stroke Robert Warner)    Past Surgical History:  Procedure Laterality Date   APPENDECTOMY  1968   CATARACT EXTRACTION Bilateral 07/2000   states 2 weeks ago (first of sept) OD due in Dec    COLONOSCOPY  2004,2009   Negative, Dr. Sharlett Warner    IR IMAGING GUIDED PORT INSERTION  10/14/2021   KIDNEY STONE SURGERY  10/2018   PROSTATE BIOPSY  2006   Robert Warner   Patient Active Problem List   Diagnosis Date Noted   Hemiparesis affecting nondominant side as late effect of stroke (Robert Warner) 04/18/2022   Gait abnormality 03/28/2022   Left arm weakness 01/23/2022   UTI (urinary tract infection) 01/23/2022   Cerebrovascular accident (CVA) due to bilateral embolism of posterior cerebral arteries (Robert Warner) 12/30/2021   Right foot drop 12/30/2021   Bacteremia due to Klebsiella pneumoniae 12/07/2021   DVT in Left peroneal vein 12/07/2021   CVA (cerebral vascular accident) (Robert Warner) 12/06/2021   AKI (acute kidney injury) (Robert Warner) 12/04/2021   Hyponatremia 12/04/2021   Cancer with unknown primary site (Robert Warner) 10/11/2021   Goals of care, counseling/discussion 10/11/2021   Chest pain 09/21/2021   Recent cerebrovascular accident (CVA) 09/21/2021   Interatrial cardiac shunt 31/54/0086   Acute embolic stroke (Robert Warner) 76/19/5093   Mixed diabetic  hyperlipidemia associated with type 2 diabetes mellitus (Robert Warner) 09/16/2021   Liver masses 09/16/2021   Essential hypertension 03/12/2012   Type 2 diabetes mellitus with hyperglycemia (Robert Warner) 06/15/2008   BPH (benign prostatic hyperplasia) 06/15/2008    ONSET DATE: Wife reports more cognitive decline after CVA January 2023  REFERRING DIAG: I63.9 (ICD-10-CM) - CVA (cerebral vascular accident) (Robert Warner), Metastatic adenocarcinoma  THERAPY DIAG: Cognitive communication deficit  Rationale for Evaluation and Treatment Rehabilitation  SUBJECTIVE: "What did you ask me?" (During conversation)  PAIN:  Are you having pain? No   OBJECTIVE:  TREATMENT:  05/08/22 SLP targeted pt's sustained attention today. In conversation with SLP pt maintained SLP-led conversational topic for 60-90 seconds and then pt req'd redirection back to topic due to lack of participation/response. SLP led the conversation - pt with no questions to SLP. In simple 10 minute categorization tasks (convergent naming) pt initially req'd consistent max cueing, faded to usual mod cueing.  05/03/22: ST services focused on cognition this session. SLP facilitated attention during structured cognitive task. Pt required modA to name 10 basic objects in categories (fruit - modA 5 min , veggies - modA 5 min, and colors - modA 3 min). Facilitated attention during structured conversation. 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 Pt was able  to tell SLP where he went to college. 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 Facilitated orientation by discussed pt's upcoming birthday. Pt was unable to recall how old he was turning, verbalizing "38". After SLP told pt "62", but was unable to recall 2 min later.    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 As opposed to previous session, pt  was able to converse and appropriately ask/ answer questions with minA verbal cues to remain on topic. After 15 minutes of conversation, communication breakdown started to occur. Pt had difficulty answering basic biographical questions regarding him and his wife's first meeting. Pt tends to deflect confusion with humor; therefore, SLP gently redirected pt to question. With redirection, pt was able to answer appropriately. SLP and pt discussed the possibility of him moving to ALF. SLP asked about what pt currently does around the house. He reported that he unloads and loads the dishwasher at home to help his wife at home; however, reports that she does 99% of the work.   SLP further facilitated attention by addressing functional task (sorting ingredients into appropriate categories on a grocery list). Pt required modA verbal and gestural cueing to complete task. He also required cueing for visual attention/scanning. When pt began to lose attention and SLP provided brain breaks to reset/refocus.   04/19/22: Pt participated in skilled ST services with focus on cognition this date. SLP completed evaluation of cognitive skills using CLQT. He required repetition of direction and consistently voiced, "I don't remember what I am supposed to be doing."  Scores are as follows re:   Attention  15/215  SEVERE  Memory  103/185 MODERATE  Executive Functions  6/ 40 SEVERE  Language  25/37 MILD  Visuospatial Skills  9/105 SEVERE  Clock Drawing 1/13 SEVERE       PATIENT EDUCATION: Education details: attention Person educated: Patient  Education method: Customer service manager Education comprehension: verbalized understanding, needs further education, and (wife verbalized understanding, and [name] will require further education.     GOALS: Goals reviewed with patient? Yes   SHORT TERM GOALS: Target date: 05/11/2022     Pt will perform a functional task for 3 minutes without evidence of distraction, in 3  sessions Baseline: Goal status: Ongoing   2.   Pt will perform a functional task for 6 minutes with rare min A, in 3 sessions Baseline:  Goal status: Ongoing   3.  Wife/family will demo appropriate cueing back to task when pt exhibits off-task behavior, in 3 sessions Baseline:  Goal status: Ongoing   4.  Pt family will tell SLP 5 tasks to do at home that could improve pt's attention, in 2 sessions Baseline:  Goal status: [place]    5.  Pt family will explain modifications to pt environment to reduce distractions which could improve pt's attention, in 2 sessions Baseline:  Goal status: Ongoing      LONG TERM GOALS: Target date: 07/11/22     Pt will perform a functional task for 8 minutes with rare min A, in 3 sessions Baseline:  Goal status: Ongoing   2.  Pt will tindicate knowledge of 3 deficit areas in response to SLP yes/no questions Baseline:  Goal status: Ongoing   3.  Pt will demo knowledge of a simple memory system Baseline:  Goal status: Ongoing   4.  Pt will turn to appropriate section in a  2-3 section memory system 80% of the time Baseline:  Goal status: Ongoing   5.  Pt will tell family it is time for his medication with the use of alarms, for 5/7 days  Baseline:  Goal status: Ongoing   ASSESSMENT:   CLINICAL IMPRESSION: Patient is a 81 y.o. male with diagnosis of cog-comm impairment. See tx note.  Pt continues to demonstrate significant deficits in all areas of cognitive linguistics stemming from dense deficits in attention and awareness. SLP noted pt wife has not made decision re: move to assisted living facility. Pt will cont to benefit from skilled ST to rehabilitate these deficits, and pt's wife will need to attend therapy more frequently for progress to be made with compensatory measures - carryover to home necessary will be very unlikely without wife's attendance. Primary focus of therapy will be for pt's sustained and selective attention. SLP suspects pt  is affected by internal and external distraction.   OBJECTIVE IMPAIRMENTS include attention, memory, awareness, executive functioning, and aphasia. These impairments are limiting patient from managing medications, managing appointments, managing finances, household responsibilities, ADLs/IADLs, and effectively communicating at home and in community. Factors affecting potential to achieve goals and functional outcome are ability to learn/carryover information, cooperation/participation level, previous level of function, and severity of impairments.. Patient will benefit from skilled SLP services to address above impairments and improve overall function.   REHAB POTENTIAL: Fair due to pt's low level of attention and awareness/insight   PLAN: SLP FREQUENCY: 2x/week   SLP DURATION: 12 weeks   PLANNED INTERVENTIONS: Environmental controls, Cueing hierachy, Cognitive reorganization, Internal/external aids, Multimodal communication approach, SLP instruction and feedback, Compensatory strategies, and Patient/family education    [facility], [place] 05/08/2022, 9:04 AM

## 2022-05-09 ENCOUNTER — Telehealth: Payer: Self-pay | Admitting: Family Medicine

## 2022-05-09 ENCOUNTER — Encounter: Payer: Self-pay | Admitting: Family Medicine

## 2022-05-09 ENCOUNTER — Ambulatory Visit (INDEPENDENT_AMBULATORY_CARE_PROVIDER_SITE_OTHER): Payer: Medicare HMO | Admitting: Family Medicine

## 2022-05-09 VITALS — BP 110/64 | HR 110 | Temp 98.1°F | Ht 71.0 in | Wt 169.4 lb

## 2022-05-09 DIAGNOSIS — R634 Abnormal weight loss: Secondary | ICD-10-CM | POA: Diagnosis not present

## 2022-05-09 DIAGNOSIS — Z794 Long term (current) use of insulin: Secondary | ICD-10-CM

## 2022-05-09 DIAGNOSIS — E1165 Type 2 diabetes mellitus with hyperglycemia: Secondary | ICD-10-CM

## 2022-05-09 DIAGNOSIS — C801 Malignant (primary) neoplasm, unspecified: Secondary | ICD-10-CM | POA: Diagnosis not present

## 2022-05-09 DIAGNOSIS — R1084 Generalized abdominal pain: Secondary | ICD-10-CM

## 2022-05-09 DIAGNOSIS — R531 Weakness: Secondary | ICD-10-CM

## 2022-05-09 NOTE — Progress Notes (Signed)
Established Patient Office Visit  Subjective   Patient ID: EILEEN GAGEN, male    DOB: Oct 21, 1941  Age: 81 y.o. MRN: [id number]  Chief Complaint  Patient presents with   Anorexia   Urinary Tract Infection   Altered Mental Status    HPI   Robert Warner is seen with some continued decline.  He has history of adenocarcinoma from biopsy of liver with no known primary.  This was poorly differentiated.  Tumor is metastatic.  He has had gradually increasing CEA levels.  No further treatments through oncology.  He has other chronic problems including hypertension, multiple CVAs likely related to his cancer, type 2 diabetes, left left hemiparesis from one of his strokes, BPH.  He remains on Lovenox for anticoagulation.  Wife relates that he has continued to decline on multiple fronts including declining appetite, weight loss, decreased cognition, generalized weakness, and increasing pain. Seems to be having increasing difficulty swallowing foods and tablets as well.  They were prescribed MS Contin and apparently she was confused and not giving that at all but was only giving the Dilaudid.  He is still having considerable pain.  No fever.  Very poor appetite.  Poor fluid intake.  He has had a couple sugars down in the 60s and 70s.  Remains on metformin and we discussed that this really needs to be stopped with his very poor intake.  He does remain on Toujeo.  They have continuous monitoring system to alert them of blood sugars dropping.  He is currently getting several services including OT, PT, speech therapy.  Wife is not sure he is getting much benefit at this point.  They just started on palliative care but no home visit for 2 weeks.  There had been some prior discussion with oncologist regarding hospice  Past Medical History:  Diagnosis Date   Arthritis    Cancer ([facility])    Cataract    Diabetes mellitus    Hyperlipidemia    Stroke [facility])    Past Surgical History:  Procedure Laterality Date    APPENDECTOMY  1968   CATARACT EXTRACTION Bilateral 07/2000   states 2 weeks ago (first of sept) OD due in Dec    COLONOSCOPY  2004,2009   Negative, Dr. Jarold Motto    IR IMAGING GUIDED PORT INSERTION  10/14/2021   KIDNEY STONE SURGERY  10/2018   PROSTATE BIOPSY  2006   Dr.Sigmund Tannebaum    reports that he quit smoking about 55 years ago. His smoking use included cigarettes. He has a 5.00 pack-year smoking history. He has never used smokeless tobacco. He reports that he does not currently use alcohol after a past usage of about 2.0 standard drinks of alcohol per week. He reports that he does not use drugs. family history includes Breast cancer in his paternal aunt; Coronary artery disease in his maternal grandmother, maternal uncle, mother, and paternal aunt; Diabetes in his maternal aunt and maternal uncle; Hypertension in his mother; Pancreatic cancer in his brother; Stroke in his paternal grandmother and sister; Stroke (age of onset: 57) in his father. No Known Allergies  Review of Systems  Constitutional:  Positive for malaise/fatigue and weight loss.  Respiratory:  Negative for cough.   Cardiovascular:  Negative for chest pain.  Gastrointestinal:  Positive for abdominal pain.  Neurological:  Positive for weakness. Negative for seizures.      Objective:     BP 110/64 (BP Location: Left Arm, Patient Position: Sitting, Cuff Size: Normal)   Pulse Marland Kitchen)  110   Temp 98.1 F (36.7 C) (Oral)   Ht 5\' 11"  (1.803 m)   Wt 169 lb 6.4 oz (76.8 kg)   SpO2 97%   BMI 23.63 kg/m  Wt Readings from Last 3 Encounters:  05/09/22 169 lb 6.4 oz (76.8 kg)  04/28/22 172 lb (78 kg)  04/18/22 176 lb (79.8 kg)      Physical Exam Vitals reviewed.  Cardiovascular:     Rate and Rhythm: Normal rate.  Pulmonary:     Effort: Pulmonary effort is normal.     Breath sounds: Normal breath sounds.  Neurological:     Comments: He has some mild weakness left upper extremity with grip and apparently had some  since his previous stroke.      No results found for any visits on 05/09/22.  Last CBC Lab Results  Component Value Date   WBC 4.4 04/07/2022   HGB 10.5 (L) 04/07/2022   HCT 32.5 (L) 04/07/2022   MCV 87.6 04/07/2022   MCH 28.3 04/07/2022   RDW 14.4 04/07/2022   PLT 174 04/07/2022   Last metabolic panel Lab Results  Component Value Date   GLUCOSE 127 (H) 04/07/2022   NA 135 04/07/2022   K 4.5 04/07/2022   CL 99 04/07/2022   CO2 27 04/07/2022   BUN 22 04/07/2022   CREATININE 1.21 04/07/2022   GFRNONAA 60 (L) 04/07/2022   CALCIUM 9.4 04/07/2022   PROT 7.1 04/07/2022   ALBUMIN 3.6 04/07/2022   BILITOT 0.6 04/07/2022   ALKPHOS 161 (H) 04/07/2022   AST 40 04/07/2022   ALT 28 04/07/2022   ANIONGAP 9 04/07/2022   Last hemoglobin A1c Lab Results  Component Value Date   HGBA1C 8.0 (H) 01/24/2022      The ASCVD Risk score (Arnett DK, et al., 2019) failed to calculate for the following reasons:   The 2019 ASCVD risk score is only valid for ages 56 to 62   The patient has a prior MI or stroke diagnosis    Assessment & Plan:   #1 poorly differentiated adenocarcinoma with unknown primary.  Continued gradual decline.  Had a long talk with patient and wife regarding possible hospice.  They just started palliative care.  They have been getting some services with OT, PT, speech therapy as above but wife is not sure these are benefiting much.  I have discussed reconsidering hospice and she will think this over and let me know in the next couple of days -Regarding his pain he has not been taking MS Contin and they are instructed to go ahead and start that 1 every 12 hours as this was prescribed and to use the Dilaudid as breakthrough  #2 type 2 diabetes.  Couple recent mild hypoglycemic episodes.  Discontinue metformin.  May need to back down on Toujeo dose depending on his intake  #3 history of dyslipidemia.  Given his rapid decline and shorten life expectancy discontinue  atorvastatin  No follow-ups on file.    Evelena Peat, MD

## 2022-05-09 NOTE — Progress Notes (Signed)
[facility] Pall[facility] Consult Note Telephone: [phone]  Fax: [phone]   Date of encounter: 05/09/22 2:07 PM PATIENT NAME: Robert Warner [address] [place] [address]   [phone] (home)  DOB: 11-03-1941 MRN: [id number] PRIMARY CARE PROVIDER:    [name], MD,  [place] [address] [phone]  REFERRING PROVIDER:   [name], MD [address] [place],  [place] [address] [phone]  RESPONSIBLE PARTY:    Contact Information     Name Relation Home Work Mobile   [place] Spouse [phone]  [phone]   [place], [place] Daughter   [phone]       I connected with  Robert Warner and his wife on 05/09/22 by a video enabled telemedicine application and verified that I am speaking with the correct person using two identifiers.   I discussed the limitations of evaluation and management by telemedicine. The patient expressed understanding and agreed to proceed. Palliative Care was asked to follow this patient by consultation request of  Burchette, Alinda Sierras, MD to address advance care planning and complex medical decision making. This is the initial visit.          ASSESSMENT, SYMPTOM MANAGEMENT AND PLAN / RECOMMENDATIONS:   Unintentional weight loss Encouraged to continue Glucerna 2-3 times per day between meals If pt likes smoothies, can use protein powder to increase calorie and nutritional content.  2.  Generalized abd pain Advised pt's wife that long acting meds are usually given on a schedule with prn meds being used as a breakthrough medicine. Advised to follow up on exact instructions with prescribing provider. Discussed safe disposal of all unused controlled substances when no longer needed.  3.  Palliative Care Encounter Appears eligible for Hospice but delay for now at pt request to allow continuing of PT. Discuss goals of care at follow up  visit.   Follow up Palliative Care Visit: Palliative care will continue to follow for complex medical decision making, advance care planning, and clarification of goals. Return 4 weeks or prn.    This visit was coded based on medical decision making (MDM).  PPS: 60%  HOSPICE ELIGIBILITY/DIAGNOSIS: TBD  Chief Complaint:  [facility] received a referral to follow up with patient for chronic disease management in setting of metastatic cancer and hx of multiple strokes.  Palliative Care is also following to assist with advance directive planning and defining/refining goals of care.  HISTORY OF PRESENT ILLNESS:  Robert Warner is a 81 y.o. year old male with metastatic cancer to liver from an unknown primary source with hx of insulin dependent diabetes, multiple stroke, DVT, HTN, HLD, BPH and interatrial cardiac shunt.  As sequelae of CVA he has hemiparesis affecting his non-dominant left side.  He has done several rounds of chemotherapy that were not effective nor was radiation.  He has had 4 CVA but at least one was embolic from DVT.  He is continuing to go to outpatient therapy to help him maintain ambulatory function with a walker, improve gait and provide social interaction. Wife gives Lovenox daily to prevent clot.  Can dress with assistance.  Has aid that helps with shower. Incontinent of bowel and bladder. Appetite "decreasing daily".  Over the last 7 months he has lost 30 lbs. Using glucerna supplements. Last HGB A1c 8.0% on 01/24/2022. Has dexcom which alarmed the night prior at 29.  She  gave some OJ.  Fasting blood sugar was 104.  PCP has him adjust his long acting insulin like a sliding scale so he takes Toujeo 15 units if is blood sugar is below 100, 100-149 he takes 20 units and above 150 he gives 25 units.  He c/o's stomach pain intermittently.  Uses Senokot and Miralax with the pain medicine. Using hydromorphone most of the time 2 mg if still hurting in 30-60  minutes wife gives another 2 mg. He was recently prescribed Morphine sulfate ER 15 mg with the directions to give BID he picked up over the weekend but has not started using.   No current nausea but he states pain is poorly controlled.  Wife is unsure of what she is to do with his pain meds and was encouraged to follow up with prescribing provider but educated that likely the Morphine would be given scheduled and the hydromorphone would be continued on a prn basis for breakthrough pain.  They also have a unfinished prescription for OxyCodone which provider has asked them to hold onto for now.  Advised when ready to dispose of medicines that the police station offers a safe surrender location.  He also takes Protonix 20 mg BID. Wife states he is "slowly withdrawing".   History obtained from review of EMR, discussion with wife and/or [name].  I reviewed available labs, medications, imaging, studies and related documents from the EMR.  Records reviewed and summarized above.   ROS obtained from wife and pt General: NAD ENMT: denies dysphagia Cardiovascular: denies chest pain, denies DOE Pulmonary: denies cough, denies increased SOB Abdomen: endorses decreasing appetite, constipation managed with bowel regimen, endorses incontinence of bowel GU: denies dysuria, endorses incontinence of urine MSK:  denies increased weakness other than residual left sided weakness, no falls reported Skin: denies rashes or wounds Neurological: endorses severe abdominal pain, denies insomnia Psych: Endorses withdrawal from activities and people close to him  Physical Exam: Current and past weights: 169 lbs 6.4 oz as of 05/09/22 Constitutional: NAD General: WNWD  EYES: anicteric sclera ENMT: intact hearing CV: No visiible cyanosis. Can speak in complete sentences without having to stop to breathe Pulmonary: no visible increased work of breathing, no audible cough, wheezing or dyspnea Abdomen: no  visible  distension or ascites MSK: moves all extremities, ambulatory with walker Neuro:  no generalized weakness, no cognitive impairment noted while standing or ambulating Psych: non-anxious affect, A and O x 3, flat affect   CURRENT PROBLEM LIST:  Patient Active Problem List   Diagnosis Date Noted   Hemiparesis affecting nondominant side as late effect of stroke ([place]) 04/18/2022   Gait abnormality 03/28/2022   Left arm weakness 01/23/2022   UTI (urinary tract infection) 01/23/2022   Cerebrovascular accident (CVA) due to bilateral embolism of posterior cerebral arteries ([place]) 12/30/2021   Right foot drop 12/30/2021   Bacteremia due to Klebsiella pneumoniae 12/07/2021   DVT in Left peroneal vein 12/07/2021   CVA (cerebral vascular accident) ([place]) 12/06/2021   AKI (acute kidney injury) ([place]) 12/04/2021   Hyponatremia 12/04/2021   Cancer with unknown primary site [facility]) 10/11/2021   Goals of care, counseling/discussion 10/11/2021   Chest pain 09/21/2021   Recent cerebrovascular accident (CVA) 09/21/2021   Interatrial cardiac shunt 16/08/9603   Acute embolic stroke ([place]) 54/07/8118   Mixed diabetic hyperlipidemia associated with type 2 diabetes mellitus ([place]) 09/16/2021   Liver masses 09/16/2021   Essential hypertension 03/12/2012   Type 2 diabetes mellitus with hyperglycemia ([place]) 06/15/2008   BPH (benign prostatic hyperplasia) 06/15/2008   PAST MEDICAL  HISTORY:  Active Ambulatory Problems    Diagnosis Date Noted   Type 2 diabetes mellitus with hyperglycemia ([place]) 06/15/2008   BPH (benign prostatic hyperplasia) 06/15/2008   Essential hypertension 08/65/7846   Acute embolic stroke ([place]) 96/29/5284   Mixed diabetic hyperlipidemia associated with type 2 diabetes mellitus ([place]) 09/16/2021   Liver masses 09/16/2021   Chest pain 09/21/2021   Recent cerebrovascular accident (CVA) 09/21/2021   Interatrial cardiac shunt 09/21/2021   Cancer with unknown primary site [facility]) 10/11/2021   Goals  of care, counseling/discussion 10/11/2021   AKI (acute kidney injury) ([place]) 12/04/2021   Hyponatremia 12/04/2021   CVA (cerebral vascular accident) ([place]) 12/06/2021   Bacteremia due to Klebsiella pneumoniae 12/07/2021   DVT in Left peroneal vein 12/07/2021   Cerebrovascular accident (CVA) due to bilateral embolism of posterior cerebral arteries ([place]) 12/30/2021   Right foot drop 12/30/2021   Left arm weakness 01/23/2022   UTI (urinary tract infection) 01/23/2022   Gait abnormality 03/28/2022   Hemiparesis affecting nondominant side as late effect of stroke ([place]) 04/18/2022   Resolved Ambulatory Problems    Diagnosis Date Noted   CKD (chronic kidney disease) stage 3, GFR 30-59 ml/min ([place]) 09/21/2021   CVA (cerebral vascular accident) ([place]) 12/04/2021   Past Medical History:  Diagnosis Date   Arthritis    Cancer ([place])    Cataract    Diabetes mellitus    Hyperlipidemia    Stroke ([place])    SOCIAL HX:  Social History   Tobacco Use   Smoking status: Former    Packs/day: 0.50    Years: 10.00    Total pack years: 5.00    Types: Cigarettes    Quit date: 11/13/1966    Years since quitting: 55.5   Smokeless tobacco: Never  Substance Use Topics   Alcohol use: Not Currently    Alcohol/week: 2.0 standard drinks of alcohol    Types: 2 Glasses of wine per week    Comment: Occasional wine or beer   FAMILY HX:  Family History  Problem Relation Age of Onset   Hypertension Mother    Coronary artery disease Mother    Stroke Father 71   Stroke Sister    Pancreatic cancer Brother    Diabetes Maternal Aunt    Coronary artery disease Maternal Uncle        2 Maternal Uncles    Diabetes Maternal Uncle    Breast cancer Paternal Aunt    Coronary artery disease Paternal Aunt    Coronary artery disease Maternal Grandmother    Stroke Paternal Grandmother    Heart disease Neg Hx    Colon cancer Neg Hx    Esophageal cancer Neg Hx    Rectal cancer Neg Hx    Stomach cancer Neg Hx         Preferred Pharmacy: ALLERGIES: No Known Allergies   PERTINENT MEDICATIONS:  Outpatient Encounter Medications as of 05/09/2022  Medication Sig   Accu-Chek Softclix Lancets lancets Use to test blood sugar levels once a day.   aspirin EC 81 MG EC tablet Take 1 tablet (81 mg total) by mouth daily. Swallow whole.   atorvastatin (LIPITOR) 40 MG tablet Take 1 tablet (40 mg total) by mouth daily.   Cholecalciferol (VITAMIN D3) 2000 UNITS TABS Take 2,000 Units by mouth daily.   Continuous Blood Gluc Receiver (DEXCOM G6 RECEIVER) DEVI Use to check blood sugar levels as directed.   Continuous Blood Gluc Sensor (DEXCOM G6 SENSOR) MISC Use to check blood glucose  levels as directed.   enoxaparin (LOVENOX) 120 MG/0.8ML injection Inject 0.8 mLs (120 mg total) into the skin daily.   feeding supplement, GLUCERNA SHAKE, (GLUCERNA SHAKE) LIQD Take 237 mLs by mouth 3 (three) times daily between meals.   glucose blood test strip Use as instructed   HYDROmorphone (DILAUDID) 2 MG tablet Take 1-2 tablets (2-4 mg total) by mouth every 4 (four) hours as needed for severe pain.   insulin glargine, 2 Unit Dial, (TOUJEO MAX SOLOSTAR) 300 UNIT/ML Solostar Pen Inject 16 Units into the skin daily.   lidocaine-prilocaine (EMLA) cream Apply 1 application topically as needed. Apply 1/2 tablespoon to port site 2 hours prior to stick and cover with Press-and-Seal to numb site for access (Patient taking differently: Apply 1 application  topically daily as needed. Apply 1/2 tablespoon to port site 2 hours prior to stick and cover with Press-and-Seal to numb site for access)   metFORMIN (GLUCOPHAGE) 500 MG tablet Take 1 tablet (500 mg total) by mouth 2 (two) times daily with a meal.   morphine (MS CONTIN) 15 MG 12 hr tablet Take 1 tablet (15 mg total) by mouth every 12 (twelve) hours.   ondansetron (ZOFRAN) 8 MG tablet Take 1 tablet (8 mg total) by mouth every 8 (eight) hours as needed for nausea or vomiting. Start 72 hours after  each IV chemotherapy administration   pantoprazole (PROTONIX) 20 MG tablet TAKE 1 TABLET(20 MG) BY MOUTH DAILY Strength: 20 mg. May take 2 tabs 40 mg as needed   polyethylene glycol (MIRALAX / GLYCOLAX) 17 g packet Take 34 g by mouth daily as needed. (Patient taking differently: Take 17 g by mouth daily as needed for mild constipation.)   prochlorperazine (COMPAZINE) 10 MG tablet Take 1 tablet (10 mg total) by mouth every 6 (six) hours as needed for nausea.   senna-docusate (SENOKOT-S) 8.6-50 MG tablet Take 2 tablets by mouth 2 (two) times daily. Hold for diarrhea   tamsulosin (FLOMAX) 0.4 MG CAPS capsule Take 1 capsule (0.4 mg total) by mouth daily after supper.   TRAVATAN Z 0.004 % SOLN ophthalmic solution Place 1 drop into both eyes at bedtime.   No facility-administered encounter medications on file as of 05/09/2022.      CODE STATUS: Last known was full code    Thank you for the opportunity to participate in the care of Mr. Gago.  The palliative care team will continue to follow. Please call our office at [phone] if we can be of additional assistance.   [name], [name]-C  COVID-19 PATIENT SCREENING TOOL Asked and negative response unless otherwise noted:  Have you had symptoms of covid, tested positive or been in contact with someone with symptoms/positive test in the past 5-10 days? no

## 2022-05-10 ENCOUNTER — Ambulatory Visit: Payer: Medicare HMO

## 2022-05-10 ENCOUNTER — Ambulatory Visit: Payer: Medicare HMO | Admitting: Occupational Therapy

## 2022-05-10 ENCOUNTER — Other Ambulatory Visit: Payer: Self-pay

## 2022-05-10 ENCOUNTER — Encounter: Payer: Self-pay | Admitting: Family Medicine

## 2022-05-10 ENCOUNTER — Encounter: Payer: Self-pay | Admitting: Occupational Therapy

## 2022-05-10 DIAGNOSIS — R41840 Attention and concentration deficit: Secondary | ICD-10-CM | POA: Diagnosis not present

## 2022-05-10 DIAGNOSIS — C801 Malignant (primary) neoplasm, unspecified: Secondary | ICD-10-CM

## 2022-05-10 DIAGNOSIS — M6281 Muscle weakness (generalized): Secondary | ICD-10-CM

## 2022-05-10 DIAGNOSIS — R2689 Other abnormalities of gait and mobility: Secondary | ICD-10-CM

## 2022-05-10 DIAGNOSIS — I69354 Hemiplegia and hemiparesis following cerebral infarction affecting left non-dominant side: Secondary | ICD-10-CM | POA: Diagnosis not present

## 2022-05-10 DIAGNOSIS — R41841 Cognitive communication deficit: Secondary | ICD-10-CM | POA: Diagnosis not present

## 2022-05-10 DIAGNOSIS — R278 Other lack of coordination: Secondary | ICD-10-CM | POA: Diagnosis not present

## 2022-05-10 DIAGNOSIS — R2681 Unsteadiness on feet: Secondary | ICD-10-CM | POA: Diagnosis not present

## 2022-05-10 DIAGNOSIS — I63433 Cerebral infarction due to embolism of bilateral posterior cerebral arteries: Secondary | ICD-10-CM | POA: Diagnosis not present

## 2022-05-10 DIAGNOSIS — R293 Abnormal posture: Secondary | ICD-10-CM | POA: Diagnosis not present

## 2022-05-10 DIAGNOSIS — C229 Malignant neoplasm of liver, not specified as primary or secondary: Secondary | ICD-10-CM

## 2022-05-10 NOTE — Therapy (Signed)
[facility] [address] [place], [place], [address] Phone: [phone]   Fax:  [phone]  Patient Details  Name: Robert Warner MRN: [id number] Date of Birth: 28-Dec-1940 Referring Provider:  [name], MD  Encounter Date: 05/10/2022    SPEECH THERAPY DISCHARGE SUMMARY  Visits from Start of Care: 5  Current functional level related to goals / functional outcomes: Goals for final attended ST session on 05/08/22, and assessment are included below: GOALS: Goals reviewed with patient? Yes   SHORT TERM GOALS: Target date: 05/11/2022     Pt will perform a functional task for 3 minutes without evidence of distraction, in 3 sessions Baseline: Goal status: Ongoing   2.   Pt will perform a functional task for 6 minutes with rare min A, in 3 sessions Baseline:  Goal status: Ongoing   3.  Wife/family will demo appropriate cueing back to task when pt exhibits off-task behavior, in 3 sessions Baseline:  Goal status: Ongoing   4.  Pt family will tell SLP 5 tasks to do at home that could improve pt's attention, in 2 sessions Baseline:  Goal status: [place]    5.  Pt family will explain modifications to pt environment to reduce distractions which could improve pt's attention, in 2 sessions Baseline:  Goal status: Ongoing      LONG TERM GOALS: Target date: 07/11/22     Pt will perform a functional task for 8 minutes with rare min A, in 3 sessions Baseline:  Goal status: Ongoing   2.  Pt will tindicate knowledge of 3 deficit areas in response to SLP yes/no questions Baseline:  Goal status: Ongoing   3.  Pt will demo knowledge of a simple memory system Baseline:  Goal status: Ongoing   4.  Pt will turn to appropriate section in a 2-3 section memory system 80% of the time Baseline:  Goal status: Ongoing   5.  Pt will tell family it is time for his medication with the use of alarms, for 5/7 days  Baseline:  Goal  status: Ongoing   ASSESSMENT:  CLINICAL IMPRESSION: Patient is a 81 y.o. male with diagnosis of cog-comm impairment. See tx note.  Pt continues to demonstrate significant deficits in all areas of cognitive linguistics stemming from dense deficits in attention and awareness. SLP noted pt wife has not made decision re: move to assisted living facility. Pt will cont to benefit from skilled ST to rehabilitate these deficits, and pt's wife will need to attend therapy more frequently for progress to be made with compensatory measures - carryover to home necessary will be very unlikely without wife's attendance. Primary focus of therapy will be for pt's sustained and selective attention. SLP suspects pt is affected by internal and external distraction.    Remaining deficits: Significant cognitive communication deficits.   Education / Equipment: Short and long term goals.   Patient agrees to discharge. Patient goals were not met. Patient is being discharged due to a change in medical status.. Pt will be entering hospice in the next 7-10 days.    [place], [place] 05/10/2022, 5:00 PM  [facility] [address] [place], [place], [address] Phone: [phone]   Fax:  [phone]

## 2022-05-10 NOTE — Therapy (Signed)
OUTPATIENT PHYSICAL THERAPY TREATMENT NOTE   Patient Name: Robert Warner MRN: [id number] DOB:Dec 23, 1940, 81 y.o., male Today's Date: 05/10/2022  PCP: none listed REFERRING PROVIDER: [name], MD  END OF SESSION:   PT End of Session - 05/10/22 1024     Visit Number 9    Number of Visits 16    Date for PT Re-Evaluation 06/08/22    Authorization Type [employer] 2023    Authorization Time Period auth required via Cohere    PT Start Time 1015    PT Stop Time 1100    PT Time Calculation (min) 45 min    Equipment Utilized During Treatment Gait belt    Activity Tolerance Patient tolerated treatment well    Behavior During Therapy WFL for tasks assessed/performed             Past Medical History:  Diagnosis Date   Arthritis    Cancer ([place])    Cataract    Diabetes mellitus    Hyperlipidemia    Stroke [facility])    Past Surgical History:  Procedure Laterality Date   APPENDECTOMY  1968   CATARACT EXTRACTION Bilateral 07/2000   states 2 weeks ago (first of sept) OD due in Dec    COLONOSCOPY  2004,2009   Negative, Dr. Sharlett Iles    IR IMAGING GUIDED PORT INSERTION  10/14/2021   KIDNEY STONE SURGERY  10/2018   PROSTATE BIOPSY  2006   Dr.Sigmund Tannebaum   Patient Active Problem List   Diagnosis Date Noted   Hemiparesis affecting nondominant side as late effect of stroke ([place]) 04/18/2022   Gait abnormality 03/28/2022   Left arm weakness 01/23/2022   UTI (urinary tract infection) 01/23/2022   Cerebrovascular accident (CVA) due to bilateral embolism of posterior cerebral arteries ([place]) 12/30/2021   Right foot drop 12/30/2021   Bacteremia due to Klebsiella pneumoniae 12/07/2021   DVT in Left peroneal vein 12/07/2021   CVA (cerebral vascular accident) ([place]) 12/06/2021   AKI (acute kidney injury) ([place]) 12/04/2021   Hyponatremia 12/04/2021   Cancer with unknown primary site [facility]) 10/11/2021   Goals of care, counseling/discussion 10/11/2021   Chest pain  09/21/2021   Recent cerebrovascular accident (CVA) 09/21/2021   Interatrial cardiac shunt 46/27/0350   Acute embolic stroke ([place]) 09/38/1829   Mixed diabetic hyperlipidemia associated with type 2 diabetes mellitus ([place]) 09/16/2021   Liver masses 09/16/2021   Essential hypertension 03/12/2012   Type 2 diabetes mellitus with hyperglycemia ([place]) 06/15/2008   BPH (benign prostatic hyperplasia) 06/15/2008    REFERRING DIAG: I63.9 (ICD-10-CM) - CVA (cerebral vascular accident) ([place])   THERAPY DIAG:  Hemiplegia and hemiparesis following cerebral infarction affecting left non-dominant side (HCC)  Muscle weakness (generalized)  Unsteadiness on feet  Other abnormalities of gait and mobility  Rationale for Evaluation and Treatment Rehabilitation  PERTINENT HISTORY: HTN HLD Metastatic adenocarcinoma to liver of unknown primary  Stroke, 1st Nov 4th 2022  PRECAUTIONS: fall risk  SUBJECTIVE: Feeling more rested today  PAIN:  Are you having pain? No   OBJECTIVE:   TODAY'S TREATMENT: 05/10/22 Activity Comments  NU-step level 4 x 6 min Dynamic warm-up/cues for pacing  Sidestepping along counter x 2 min   Tandem stance at counter Attempted via visual/tactile/verbal instruction but unable to follow direction  LAQ 5# 3x10 Tactile cues for LLE engagement  Hip add isom 3x10 Tactile cues for engagement  Standing on airex: 3x30 sec Cues for eyes closed, but opens intermittently  COGNITION: Overall cognitive status: Impaired: Attention: Impaired: Focused, Sustained, Divided, Memory: Impaired: Immediate Procedural, and Awareness: Impaired: Anticipatory             SENSATION: Light touch: Impaired  Proprioception: Impaired    COORDINATION: Difficulty with rapid alternating movements       MUSCLE TONE: no spasticity present, possibly some hypotonia LLE         POSTURE: No Significant postural limitations   LOWER EXTREMITY ROM:    No limitations noted   LOWER  EXTREMITY MMT:     MMT Right Eval Left Eval  Hip flexion 5 3+  Hip extension      Hip abduction 5 3+  Hip adduction 5 4  Hip internal rotation      Hip external rotation      Knee flexion      Knee extension 5 3+  Ankle dorsiflexion 5 4  Ankle plantarflexion      Ankle inversion      Ankle eversion      (Blank rows = not tested)   BED MOBILITY:  Independent   TRANSFERS: Assistive device utilized: Environmental consultant - 2 wheeled  Sit to stand: Modified independence Stand to sit: Modified independence Chair to chair: Modified independence Floor:  DNT     STAIRS:           Level of Assistance: SBA           Stair Negotiation Technique: Step to Pattern with Single Rail on Right           Number of Stairs: 4             Height of Stairs: 4-6  Comments: decreased speed   GAIT: Gait pattern: step through pattern and poor foot clearance- Left Distance walked: 150 Assistive device utilized: Walker - 2 wheeled Level of assistance: SBA Comments:    FUNCTIONAL TESTs:      [facility] PT Assessment - 04/13/22 0001                Balance    Balance Assessed Yes          Static Standing Balance    Static Standing - Comment/# of Minutes --          Standardized Balance Assessment    Standardized Balance Assessment Berg Balance Test;Timed Up and Go Test;Five Times Sit to Stand     Five times sit to stand comments  19.37          Berg Balance Test    Sit to Stand Able to stand  independently using hands     Standing Unsupported Able to stand safely 2 minutes     Sitting with Back Unsupported but Feet Supported on Floor or Stool Able to sit safely and securely 2 minutes     Stand to Sit Sits safely with minimal use of hands     Transfers Able to transfer safely, minor use of hands     Standing Unsupported with Eyes Closed Able to stand 10 seconds with supervision     Standing Unsupported with Feet Together Able to place feet together independently and stand for 1 minute with supervision      From Standing, Reach Forward with Outstretched Arm Can reach forward >12 cm safely (5")     From Standing Position, Pick up Object from Floor Able to pick up shoe, needs supervision     From Standing Position, Turn to Look Behind Over each Shoulder Turn sideways only  but maintains balance     Turn 360 Degrees Needs close supervision or verbal cueing     Standing Unsupported, Alternately Place Feet on Step/Stool Able to complete >2 steps/needs minimal assist     Standing Unsupported, One Foot in [employer] balance while stepping or standing     Standing on One Leg Tries to lift leg/unable to hold 3 seconds but remains standing independently     Total Score 36          Timed Up and Go Test    Normal TUG (seconds) 26.1                    M-CTSIB  Condition 1: Firm Surface, EO 30 Sec, Mild Sway  Condition 2: Firm Surface, EC 30 Sec, Moderate Sway  Condition 3: Foam Surface, EO 20 Sec, Severe Sway  Condition 4: Foam Surface, EC 5 Sec,  LOB  Sway    PATIENT SURVEYS:      TODAY'S TREATMENT:  PT evaluation/assessment Tandem stance 3x30 sec at counter Marching 3x10 at counter     PATIENT EDUCATION: Education details: fall risk assessments and HEP initiation Person educated: Patient and Spouse Education method: Explanation and Demonstration Education comprehension: verbalized understanding     HOME EXERCISE PROGRAM: Tandem stance 3x30 sec at counter March at counter 3x10  Access Code: [email or web address] URL: [email or web address] Date: 04/17/2022 Prepared by: Sherlyn Lees  Exercises - Side Stepping with Counter Support  - 1 x daily - 7 x weekly - 2 sets - 2 min hold - Standing Hip Abduction with Counter Support  - 1 x daily - 7 x weekly - 3 sets - 10 reps - Seated Long Arc Quad  - 1 x daily - 7 x weekly - 3 sets - 10 reps - 2 sec hold     GOALS: Goals reviewed with patient? Yes   SHORT TERM GOALS: Target date: 05/11/2022   Patient will be independent in HEP to  improve functional outcomes Baseline: Goal status: INITIAL   2.  Patient will demonstrate score 45/56 Berg Balance Test to manifest low risk for falls Baseline: 36/56 Goal status: INITIAL       LONG TERM GOALS: Target date: 06/08/2022   Demonstrate improved BLE strength and balance as evidenced by time of 15 sec for 5xSTS test Baseline: 19+ sec Goal status: INITIAL   2.  Patient will achieve 12 seconds for TUG test to manifest reduced risk for falls Baseline: 23 w/ RW Goal status: INITIAL   3.  Demonstrate improved proprioception and static balance as evidenced by ability to maintain minimal sway condition 4 M-CTSIB Baseline: complete LOB Goal status: INITIAL       ASSESSMENT:   CLINICAL IMPRESSION: Patient demonstrates increased difficulty with following instruction/direction with confusion and left inattention which worsens with onset of fatigue. Consistent cues for sequence and attention needed throughout session and frequent rest periods.  Unable to sequence tandem stance position and this activity was abandoned after several attempts at visual/tactile/verbal description/demonstration.  Improved attention and carryover with seated strengthening exercises vs other tasks likely due to simple, one-step, on joint movements. Continued sessions per pt tolerance to progress mobility and activity tolerance     OBJECTIVE IMPAIRMENTS Abnormal gait, decreased activity tolerance, decreased balance, decreased cognition, decreased coordination, decreased knowledge of use of DME, decreased mobility, difficulty walking, decreased strength, decreased safety awareness, and impaired tone.    ACTIVITY LIMITATIONS carrying, lifting, bending, standing, squatting, stairs, transfers, reach over head, and  locomotion level   PARTICIPATION LIMITATIONS: meal prep, cleaning, laundry, interpersonal relationship, driving, shopping, and yard work   PERSONAL FACTORS Age, Time since onset of  injury/illness/exacerbation, and 1-2 comorbidities: CA and hx of CVA  are also affecting patient's functional outcome.    REHAB POTENTIAL: Good   CLINICAL DECISION MAKING: Evolving/moderate complexity   EVALUATION COMPLEXITY: Moderate   PLAN: PT FREQUENCY: 1-2x/week   PT DURATION: 8 weeks   PLANNED INTERVENTIONS: Therapeutic exercises, Therapeutic activity, Neuromuscular re-education, Balance training, Gait training, Patient/Family education, Joint mobilization, Vestibular training, Canalith repositioning, DME instructions, Wheelchair mobility training, Splintting, Taping, and Manual therapy   PLAN FOR NEXT SESSION:  HEP review, stride stance sit-stand (alternating foot placement ea set)  10:24 AM, 05/10/22 M. Sherlyn Lees, PT, DPT Physical Therapist- [place] Office Number: [phone]

## 2022-05-10 NOTE — Therapy (Signed)
OUTPATIENT OCCUPATIONAL THERAPY TREATMENT NOTE  Patient Name: Robert Warner MRN: [id number] DOB:01-05-1941, 81 y.o., male Today's Date: 05/10/2022  PCP: [name], MD REFERRING PROVIDER: [name], MD   OT End of Session - 05/10/22 1112     Visit Number 5    Number of Visits 17    Date for OT Re-Evaluation 06/16/22    Authorization Type [employer] Medicare    OT Start Time 1104    OT Stop Time 1145    OT Time Calculation (min) 41 min    Activity Tolerance Patient limited by pain    Behavior During Therapy Impulsive;Anxious;Flat affect            Past Medical History:  Diagnosis Date   Arthritis    Cancer ([place])    Cataract    Diabetes mellitus    Hyperlipidemia    Stroke [facility])    Past Surgical History:  Procedure Laterality Date   APPENDECTOMY  1968   CATARACT EXTRACTION Bilateral 07/2000   states 2 weeks ago (first of sept) OD due in Dec    COLONOSCOPY  2004,2009   Negative, Dr. Sharlett Iles    IR [place]  10/14/2021   KIDNEY STONE SURGERY  10/2018   PROSTATE BIOPSY  2006   Dr.Sigmund Tannebaum   Patient Active Problem List   Diagnosis Date Noted   Hemiparesis affecting nondominant side as late effect of stroke ([place]) 04/18/2022   Gait abnormality 03/28/2022   Left arm weakness 01/23/2022   UTI (urinary tract infection) 01/23/2022   Cerebrovascular accident (CVA) due to bilateral embolism of posterior cerebral arteries ([place]) 12/30/2021   Right foot drop 12/30/2021   Bacteremia due to Klebsiella pneumoniae 12/07/2021   DVT in Left peroneal vein 12/07/2021   CVA (cerebral vascular accident) ([place]) 12/06/2021   AKI (acute kidney injury) ([place]) 12/04/2021   Hyponatremia 12/04/2021   Cancer with unknown primary site [facility]) 10/11/2021   Goals of care, counseling/discussion 10/11/2021   Chest pain 09/21/2021   Recent cerebrovascular accident (CVA) 09/21/2021   Interatrial cardiac shunt 98/92/1194   Acute embolic stroke ([place])  17/40/8144   Mixed diabetic hyperlipidemia associated with type 2 diabetes mellitus ([place]) 09/16/2021   Liver masses 09/16/2021   Essential hypertension 03/12/2012   Type 2 diabetes mellitus with hyperglycemia ([place]) 06/15/2008   BPH (benign prostatic hyperplasia) 06/15/2008    ONSET DATE: 01/23/2022  REFERRING DIAG: I63.9 (ICD-10-CM) - CVA (cerebral vascular accident)   THERAPY DIAG:  Attention and concentration deficit  Hemiplegia and hemiparesis following cerebral infarction affecting left non-dominant side (HCC)  Other lack of coordination  Muscle weakness (generalized)  Unsteadiness on feet  Rationale for Evaluation and Treatment Rehabilitation  SUBJECTIVE:   SUBJECTIVE STATEMENT: "My stomach hurts;" "I had a bad experience this morning" Pt accompanied by: self - family member did not come back to therapy  PAIN:  Are you having pain? Yes - unable to identify score on 1-10 scale  PATIENT GOALS: to be more independent  OBJECTIVE:   TODAY'S TREATMENT - 05/10/22: Folding a small towel w/ pt demonstrating poor termination of task, often folding the towel until unable to fold anymore. After lack of success w/ demonstration and cues, OT attempted to provide model for pt to copy w/ pt unable to sequence through task, continuing to complete more steps than needed. Pt demonstrated confusion and mild frustration when redirected to terminate task appropriately. Success improved w/ binary choice and step-by-step instruction, however, spontaneous use of L hand  decreased w/ increased repetition. Finishing simple pattern on pegboard w/ easy-grip pegs to address attention to task, [place], visual scanning, and attention to L side. Pt used L hand to pick up pegs and rotate in-hand to place into pegboard w/ min drops. Pt required moderate verbal/visual cues and breakdown of task for spatial orientation and relationships; first set completed simple 2-color alternating pattern and 2nd set completed  simple pattern w/ 4 colors. Required occ redirection to task, cues for pattern completion, and extended time for success Manipulating shirt buttons: pt unable to button first button despite significant extended time and OT graded task down to unbuttoning only w/ increased success. Pt required verbal cues prn, able to unbutton 4/5 buttons successfully; pt did demonstrate spontaneous use of L hand w/out cues throughout task Ambulated out of session to lobby w/ RW at [facility]; occ verbal cues to attend to L side when navigating gym environment   PATIENT EDUCATION: Educated on attention and initiation during structured tasks and functional use of LUE at diminished level. Person educated: Patient Education method: Explanation Education comprehension: verbalized understanding   HOME EXERCISE PROGRAM: Coordination HEP - see pt instructions   GOALS: Goals reviewed with patient? Yes  SHORT TERM GOALS: Target date: 05/19/2022  STG  Status:  1 Pt will be independent with coordination HEP to increase independence with ADLs and IADLs. Baseline:  Progressing  2 Pt will complete table top scanning activity with min cues for technique and recognition of errors. Baseline:  Progressing  3 Pt will demonstrate ability to sequence simple functional task (simple snack prep, laundry task, etc) with supervision Baseline:  Progressing  4 Pt will demonstrate improved fine motor coordination for ADLs as evidenced by decreasing 9 hole peg test score for LUE by 10 secs Baseline: Right: 39.94 sec; Left: 1:02.85 sec Progressing   LONG TERM GOALS: Target date: 06/16/2022  LTG  Status:  1 Pt will navigate a moderately busy environment, following multi-step commands with 90% accuracy Baseline:  Progressing  2 Pt will be able to complete UB dressing with supervision and <2 cues demonstrating improved awareness of errors and sequencing of tasks. Baseline:  Progressing  3 Pt will demonstrate improved engagement and  motivation to engage in leisure tasks by report of completing 2 familiar tasks with < min encouragement. Baseline:  Progressing  4 Pt will demonstrate improved fine motor coordination for ADLs as evidenced by decreasing 9 hole peg test score for LUE by 15 secs Baseline: Right: 39.94 sec; Left: 1:02.85 sec Progressing  5 Pt will demonstrate improved UE functional use for ADLs as evidenced by increasing box/ blocks score by 5 blocks with BUE Baseline: Right 25 blocks, Left 19 blocks  Progressing    ASSESSMENT:  CLINICAL IMPRESSION: After initial rest break between PT and OT, pt was able to attend to tasks fairly well today. OT incorporated familiar functional activities into treatment and completed session in minimally distracting environment - on therapy mat facing away from other activity in the gym. Pt demonstrated increased success w/ activities involving clear steps (e.g., a specific number of pegs vs an undefined amount of folds w/ the towel), still benefiting from cues for sequencing, attention, and incorporation of L side. Session focused largely on control/coordination, attention to task, and attention to L side.Cognition continues to be limiting to successful participation in functional tasks.  PERFORMANCE DEFICITS in functional skills including ADLs, IADLs, coordination, proprioception, sensation, ROM, strength, FMC, GMC, balance, body mechanics, endurance, decreased knowledge of precautions, and UE functional  use, cognitive skills including attention, memory, problem solving, and sequencing.  IMPAIRMENTS are limiting patient from ADLs and IADLs.   COMORBIDITIES may have co-morbidities  that affects occupational performance. Patient will benefit from skilled OT to address above impairments and improve overall function.   PLAN: OT FREQUENCY: 2x/week  OT DURATION: 8 weeks  PLANNED INTERVENTIONS: self care/ADL training, therapeutic exercise, therapeutic activity, neuromuscular  re-education, manual therapy, balance training, functional mobility training, electrical stimulation, ultrasound, moist heat, cryotherapy, patient/family education, cognitive remediation/compensation, visual/perceptual remediation/compensation, psychosocial skills training, energy conservation, and DME and/or AE instructions  RECOMMENDED OTHER SERVICES: N/A  CONSULTED AND AGREED WITH PLAN OF CARE: Patient and family member/caregiver  PLAN FOR NEXT SESSION: engage in functional familiar table top tasks with focus on attention and increasing to dual task as able, LUE shoulder strengthening with functional reach   [name], OTR/L 05/10/2022, 12:01 PM

## 2022-05-11 ENCOUNTER — Telehealth: Payer: Self-pay

## 2022-05-11 DIAGNOSIS — R634 Abnormal weight loss: Secondary | ICD-10-CM | POA: Insufficient documentation

## 2022-05-11 DIAGNOSIS — R1084 Generalized abdominal pain: Secondary | ICD-10-CM | POA: Insufficient documentation

## 2022-05-11 NOTE — Telephone Encounter (Signed)
Confirmed with Robert Warner from [facility] that MD Benay Spice will be the attending physician for this patient, the patient has 6 months or less to live,and that the providers at [facility] will provide comfort care orders.

## 2022-05-12 NOTE — Therapy (Signed)
[facility] [address] [place], [place], [address] Phone: [phone]   Fax:  [phone]  Patient Details  Name: Robert Warner MRN: [id number] Date of Birth: December 07, 1940 Referring Provider:  No ref. provider found  Encounter Date: 05/12/2022  PHYSICAL THERAPY DISCHARGE SUMMARY  Visits from Start of Care: 9  Current functional level related to goals / functional outcomes: Continues to exhibit balance, gait, coordination deficits and need for supervision for mobility   Remaining deficits: LE weakness, balance/coordination deficits, reduced safety awareness   Education / Equipment: HEP initiated   Patient agrees to discharge. Patient goals were not met. Patient is being discharged due to a change in medical status., transitioned to hospice care    [name], PT 05/12/2022, 8:51 AM  [facility] [address] [place], [place], [address] Phone: [phone]   Fax:  [phone]

## 2022-05-15 ENCOUNTER — Ambulatory Visit: Payer: Medicare HMO

## 2022-05-15 ENCOUNTER — Encounter: Payer: Medicare HMO | Admitting: Occupational Therapy

## 2022-05-17 ENCOUNTER — Ambulatory Visit: Payer: Medicare HMO

## 2022-05-17 ENCOUNTER — Telehealth: Payer: Self-pay | Admitting: Pharmacist

## 2022-05-17 ENCOUNTER — Encounter: Payer: Medicare HMO | Admitting: Occupational Therapy

## 2022-05-17 NOTE — Chronic Care Management (AMB) (Signed)
Chronic Care Management Pharmacy Assistant   Name: Robert Warner  MRN: [id number] DOB: 1940/11/14  Reason for Encounter: Check in to see if they are needing anything per Robert Warner    Recent office visits:  05/09/22 Robert Warner - Patient presented for Cancer with unknown primary site and other concerns. Stopped Metformin. Stopped Atorvastatin. Instructed to use MS Contin 1 every 12 hrs and Dilaudid as breakthrough for pain.  Recent consult visits:  05/10/22 Robert Warner, Robert Warner - Patient presented for Attention and concentration deficit and other concerns. No medication changes.  05/10/22 Robert Warner - Patient presented for Hemiplegia and hemiparesis following cerebral infarction affecting left non-dominant side and other concerns. No medication changes.  05/09/22 Robert Warner - Patient presented via telemedicine for Weight Loss unintentional and other concerns. No medication changes.  05/08/22  Robert Warner - Patient presented for Muscle weakness and other concerns. No medication changes.  05/08/22 Robert Warner, [name] - Patient presented for Cognitive Communication deficit. No medication changes.  05/03/22 Robert Warner - Patient presented for Muscle weakness and other concerns. No medication changes.  05/03/22 Robert Warner - Patient presented for Cognitive Communication deficit. No medication changes.  04/28/22 Robert Shark, NP (Hematology and Oncology) - Patient presented for Cancer with unknown primary site. Prescribed Hydromorphone HCL Prescribed Morphine Sulfate. Stopped Oxycodone-Acetaminophen.  04/18/22 Warner, Robert Salk, MD - Patient presented for Hemiparesis affecting nondominant side as late effect of stroke. No medication changes.  04/14/22 Robert Pier, MD (Oncology)- Patient presented for Adenocarcinoma determined by biopsy of liver. Stopped gemcitabine/cisplatin and trastuzumab  04/12/22 Claims encounter for Cognitive  communication deficit. No other visit details available.  04/07/22 Robert Shark, NP (Hematology and Oncology) - Patient presented for Cancer with unknown primary site. Recommended 2 Percocet tabs for pain  04/07/22 Patient presented to [facility] for Infusion.  Hospital visits:  Medication Reconciliation was completed by comparing discharge summary, patient's EMR and Pharmacy list, and upon discussion with patient.  Patient presented to [facility] on 03/26/22 due to Generalized Abdominal Pain. Patient was present for 3 hours.  New?Medications Started at [facility] Discharge:?? -started  none  Medication Changes at Hospital Discharge: -Changed  none  Medications Discontinued at Hospital Discharge: -Stopped  none  Medications that remain the same after Hospital Discharge:??  -All other medications will remain the same.    Medications: Outpatient Encounter Medications as of 05/17/2022  Medication Sig   Accu-Chek Softclix Lancets lancets Use to test blood sugar levels once a day.   aspirin EC 81 MG EC tablet Take 1 tablet (81 mg total) by mouth daily. Swallow whole.   atorvastatin (LIPITOR) 40 MG tablet Take 1 tablet (40 mg total) by mouth daily.   Cholecalciferol (VITAMIN D3) 2000 UNITS TABS Take 2,000 Units by mouth daily.   Continuous Blood Gluc Receiver (DEXCOM G6 RECEIVER) DEVI Use to check blood sugar levels as directed.   Continuous Blood Gluc Sensor (DEXCOM G6 SENSOR) MISC Use to check blood glucose levels as directed.   enoxaparin (LOVENOX) 120 MG/0.8ML injection Inject 0.8 mLs (120 mg total) into the skin daily.   feeding supplement, GLUCERNA SHAKE, (GLUCERNA SHAKE) LIQD Take 237 mLs by mouth 3 (three) times daily between meals.   glucose blood test strip Use as instructed   HYDROmorphone (DILAUDID) 2 MG tablet Take 1-2 tablets (2-4 mg total) by mouth every 4 (four) hours as needed  for severe pain.   insulin glargine, 2 Unit Dial, (TOUJEO  MAX SOLOSTAR) 300 UNIT/ML Solostar Pen Inject 16 Units into the skin daily.   lidocaine-prilocaine (EMLA) cream Apply 1 application topically as needed. Apply 1/2 tablespoon to port site 2 hours prior to stick and cover with Press-and-Seal to numb site for access (Patient taking differently: Apply 1 application  topically daily as needed. Apply 1/2 tablespoon to port site 2 hours prior to stick and cover with Press-and-Seal to numb site for access)   morphine (MS CONTIN) 15 MG 12 hr tablet Take 1 tablet (15 mg total) by mouth every 12 (twelve) hours.   ondansetron (ZOFRAN) 8 MG tablet Take 1 tablet (8 mg total) by mouth every 8 (eight) hours as needed for nausea or vomiting. Start 72 hours after each IV chemotherapy administration   pantoprazole (PROTONIX) 20 MG tablet TAKE 1 TABLET(20 MG) BY MOUTH DAILY Strength: 20 mg. May take 2 tabs 40 mg as needed   polyethylene glycol (MIRALAX / GLYCOLAX) 17 g packet Take 34 g by mouth daily as needed. (Patient taking differently: Take 17 g by mouth daily as needed for mild constipation.)   prochlorperazine (COMPAZINE) 10 MG tablet Take 1 tablet (10 mg total) by mouth every 6 (six) hours as needed for nausea.   senna-docusate (SENOKOT-S) 8.6-50 MG tablet Take 2 tablets by mouth 2 (two) times daily. Hold for diarrhea   tamsulosin (FLOMAX) 0.4 MG CAPS capsule Take 1 capsule (0.4 mg total) by mouth daily after supper.   TRAVATAN Z 0.004 % SOLN ophthalmic solution Place 1 drop into both eyes at bedtime.   No facility-administered encounter medications on file as of 05/17/2022.    Care Gaps: Zoster Vaccine - Overdue DAP - Overdue Foot Exam - Overdue Urine Micro - Overdue AWV-  9/22 BP- 110/64 05/09/22 Lab Results  Component Value Date   HGBA1C 8.0 (H) 01/24/2022    Star Rating Drugs: Atorvastatin (Lipitor) 40 mg - Discontinued Metformin 750 mg - Discontinued  Notes: Per Chart notes Warner passed on June 14, 2022 [facility] informed  [place]  Pharmacist Assistant [phone]

## 2022-05-22 ENCOUNTER — Encounter: Payer: Medicare HMO | Admitting: Occupational Therapy

## 2022-05-22 ENCOUNTER — Encounter: Payer: Self-pay | Admitting: *Deleted

## 2022-05-22 ENCOUNTER — Ambulatory Visit: Payer: Medicare HMO

## 2022-05-22 NOTE — Progress Notes (Signed)
Patient entered into Hospice care and unable to come to appts. Active listening provided and informed patient's wife to call with any needs. Verbalized appreciation.

## 2022-05-23 ENCOUNTER — Inpatient Hospital Stay: Payer: Medicare HMO | Admitting: Oncology

## 2022-05-23 ENCOUNTER — Inpatient Hospital Stay: Payer: Medicare HMO | Attending: Oncology | Admitting: Licensed Clinical Social Worker

## 2022-05-23 NOTE — Progress Notes (Unsigned)
[facility] CSW Progress Note  Clinical Education officer, museum contacted caregiver by phone to introduce self and to assess needs.  Patient's spouse, [name], said patient was very restless last night.  He was twisting and turning and this caused her to have minimal sleep also.  CSW encouraged Carolyne to contact the Hospice RN regarding her concerns and she agreed.  Carolyne stated she was trying to "stay in the moment" and get through the day.  The Hospice aid was present and providing care to patient.  The Hospice Social Worker is scheduled to meet with her today and [name] will ask an Therapist, sports to also attend.  CSW provided active listening.  Carolyne stated she would contact CSW in the future if needed and expressed appreciation for the call.    Rodman Pickle Jasia Hiltunen, LCSW

## 2022-05-24 ENCOUNTER — Ambulatory Visit: Payer: Medicare HMO

## 2022-05-24 ENCOUNTER — Encounter: Payer: Medicare HMO | Admitting: Occupational Therapy

## 2022-05-24 ENCOUNTER — Encounter: Payer: Medicare HMO | Admitting: Speech Pathology

## 2022-05-25 ENCOUNTER — Other Ambulatory Visit: Payer: Self-pay | Admitting: Family Medicine

## 2022-05-29 ENCOUNTER — Telehealth: Payer: Self-pay

## 2022-05-29 ENCOUNTER — Ambulatory Visit: Payer: Medicare HMO | Admitting: Physical Therapy

## 2022-05-29 ENCOUNTER — Encounter: Payer: Medicare HMO | Admitting: Occupational Therapy

## 2022-05-29 NOTE — Telephone Encounter (Signed)
Correspondence from [place] passed away 06/01/22 at 138 am.

## 2022-05-31 ENCOUNTER — Ambulatory Visit: Payer: Medicare HMO

## 2022-05-31 ENCOUNTER — Encounter: Payer: Medicare HMO | Admitting: Occupational Therapy

## 2022-06-05 ENCOUNTER — Other Ambulatory Visit: Payer: Self-pay

## 2022-06-13 DEATH — deceased

## 2022-10-03 ENCOUNTER — Telehealth: Payer: Self-pay | Admitting: *Deleted

## 2023-10-03 NOTE — Telephone Encounter (Signed)
Telephone call  

## 2023-10-04 NOTE — Telephone Encounter (Signed)
Telephone call
